# Patient Record
Sex: Female | Born: 1935 | Race: White | Hispanic: No | Smoking: Former smoker
Health system: Southern US, Community
[De-identification: ages and names within clinical notes are randomized; demographics above are authoritative.]

## PROBLEM LIST (undated history)

## (undated) DIAGNOSIS — IMO0001 Reserved for inherently not codable concepts without codable children: Secondary | ICD-10-CM

## (undated) DIAGNOSIS — Z9889 Other specified postprocedural states: Secondary | ICD-10-CM

## (undated) DIAGNOSIS — C50911 Malignant neoplasm of unspecified site of right female breast: Secondary | ICD-10-CM

## (undated) DIAGNOSIS — E119 Type 2 diabetes mellitus without complications: Secondary | ICD-10-CM

## (undated) DIAGNOSIS — M8580 Other specified disorders of bone density and structure, unspecified site: Secondary | ICD-10-CM

## (undated) DIAGNOSIS — J449 Chronic obstructive pulmonary disease, unspecified: Secondary | ICD-10-CM

## (undated) DIAGNOSIS — E7800 Pure hypercholesterolemia, unspecified: Secondary | ICD-10-CM

## (undated) DIAGNOSIS — M1990 Unspecified osteoarthritis, unspecified site: Secondary | ICD-10-CM

## (undated) DIAGNOSIS — W57XXXA Bitten or stung by nonvenomous insect and other nonvenomous arthropods, initial encounter: Secondary | ICD-10-CM

## (undated) DIAGNOSIS — M810 Age-related osteoporosis without current pathological fracture: Secondary | ICD-10-CM

## (undated) DIAGNOSIS — K635 Polyp of colon: Secondary | ICD-10-CM

## (undated) DIAGNOSIS — I10 Essential (primary) hypertension: Secondary | ICD-10-CM

## (undated) DIAGNOSIS — R112 Nausea with vomiting, unspecified: Secondary | ICD-10-CM

## (undated) DIAGNOSIS — C801 Malignant (primary) neoplasm, unspecified: Secondary | ICD-10-CM

## (undated) DIAGNOSIS — Z923 Personal history of irradiation: Secondary | ICD-10-CM

## (undated) DIAGNOSIS — C50919 Malignant neoplasm of unspecified site of unspecified female breast: Secondary | ICD-10-CM

## (undated) DIAGNOSIS — R918 Other nonspecific abnormal finding of lung field: Secondary | ICD-10-CM

## (undated) HISTORY — DX: Other nonspecific abnormal finding of lung field: R91.8

## (undated) HISTORY — DX: Other specified disorders of bone density and structure, unspecified site: M85.80

## (undated) HISTORY — PX: ABDOMINAL HYSTERECTOMY: SHX81

## (undated) HISTORY — PX: OTHER SURGICAL HISTORY: SHX169

## (undated) HISTORY — DX: Malignant neoplasm of unspecified site of right female breast: C50.911

## (undated) HISTORY — PX: BREAST EXCISIONAL BIOPSY: SUR124

## (undated) HISTORY — DX: Age-related osteoporosis without current pathological fracture: M81.0

## (undated) HISTORY — PX: BARTHOLIN GLAND CYST EXCISION: SHX565

## (undated) HISTORY — PX: BREAST SURGERY: SHX581

## (undated) HISTORY — PX: APPENDECTOMY: SHX54

## (undated) HISTORY — DX: Unspecified osteoarthritis, unspecified site: M19.90

## (undated) HISTORY — PX: TONSILLECTOMY: SUR1361

---

## 2000-12-07 ENCOUNTER — Ambulatory Visit (HOSPITAL_COMMUNITY): Admission: RE | Admit: 2000-12-07 | Discharge: 2000-12-07 | Payer: Self-pay | Admitting: Internal Medicine

## 2000-12-07 ENCOUNTER — Encounter: Payer: Self-pay | Admitting: Internal Medicine

## 2000-12-15 ENCOUNTER — Other Ambulatory Visit: Admission: RE | Admit: 2000-12-15 | Discharge: 2000-12-15 | Payer: Self-pay | Admitting: Internal Medicine

## 2001-12-12 ENCOUNTER — Ambulatory Visit (HOSPITAL_COMMUNITY): Admission: RE | Admit: 2001-12-12 | Discharge: 2001-12-12 | Payer: Self-pay | Admitting: Internal Medicine

## 2001-12-12 ENCOUNTER — Encounter: Payer: Self-pay | Admitting: Internal Medicine

## 2002-12-17 ENCOUNTER — Ambulatory Visit (HOSPITAL_COMMUNITY): Admission: RE | Admit: 2002-12-17 | Discharge: 2002-12-17 | Payer: Self-pay | Admitting: Internal Medicine

## 2002-12-17 ENCOUNTER — Encounter: Payer: Self-pay | Admitting: Internal Medicine

## 2003-03-04 ENCOUNTER — Ambulatory Visit (HOSPITAL_COMMUNITY): Admission: RE | Admit: 2003-03-04 | Discharge: 2003-03-04 | Payer: Self-pay | Admitting: Podiatry

## 2003-12-18 ENCOUNTER — Ambulatory Visit (HOSPITAL_COMMUNITY): Admission: RE | Admit: 2003-12-18 | Discharge: 2003-12-18 | Payer: Self-pay | Admitting: Internal Medicine

## 2004-12-21 ENCOUNTER — Ambulatory Visit (HOSPITAL_COMMUNITY): Admission: RE | Admit: 2004-12-21 | Discharge: 2004-12-21 | Payer: Self-pay | Admitting: Internal Medicine

## 2005-04-05 ENCOUNTER — Ambulatory Visit (HOSPITAL_COMMUNITY): Admission: RE | Admit: 2005-04-05 | Discharge: 2005-04-05 | Payer: Self-pay | Admitting: Family Medicine

## 2005-12-02 ENCOUNTER — Ambulatory Visit: Payer: Self-pay | Admitting: Orthopedic Surgery

## 2005-12-16 ENCOUNTER — Ambulatory Visit: Payer: Self-pay | Admitting: Orthopedic Surgery

## 2005-12-23 ENCOUNTER — Ambulatory Visit (HOSPITAL_COMMUNITY): Admission: RE | Admit: 2005-12-23 | Discharge: 2005-12-23 | Payer: Self-pay | Admitting: Orthopedic Surgery

## 2005-12-30 ENCOUNTER — Ambulatory Visit: Payer: Self-pay | Admitting: Orthopedic Surgery

## 2006-01-05 ENCOUNTER — Ambulatory Visit (HOSPITAL_COMMUNITY): Admission: RE | Admit: 2006-01-05 | Discharge: 2006-01-05 | Payer: Self-pay | Admitting: Family Medicine

## 2006-01-14 ENCOUNTER — Ambulatory Visit: Payer: Self-pay | Admitting: Internal Medicine

## 2006-01-24 ENCOUNTER — Encounter: Admission: RE | Admit: 2006-01-24 | Discharge: 2006-01-24 | Payer: Self-pay | Admitting: Orthopedic Surgery

## 2006-01-28 ENCOUNTER — Ambulatory Visit (HOSPITAL_COMMUNITY): Admission: RE | Admit: 2006-01-28 | Discharge: 2006-01-28 | Payer: Self-pay | Admitting: Internal Medicine

## 2006-01-28 ENCOUNTER — Ambulatory Visit: Payer: Self-pay | Admitting: Internal Medicine

## 2006-02-08 ENCOUNTER — Encounter: Admission: RE | Admit: 2006-02-08 | Discharge: 2006-02-08 | Payer: Self-pay | Admitting: Orthopedic Surgery

## 2006-02-15 ENCOUNTER — Ambulatory Visit (HOSPITAL_COMMUNITY): Admission: RE | Admit: 2006-02-15 | Discharge: 2006-02-15 | Payer: Self-pay | Admitting: Internal Medicine

## 2006-02-15 ENCOUNTER — Encounter (INDEPENDENT_AMBULATORY_CARE_PROVIDER_SITE_OTHER): Payer: Self-pay | Admitting: *Deleted

## 2006-02-15 ENCOUNTER — Ambulatory Visit: Payer: Self-pay | Admitting: Internal Medicine

## 2006-03-02 ENCOUNTER — Encounter: Admission: RE | Admit: 2006-03-02 | Discharge: 2006-03-02 | Payer: Self-pay | Admitting: Orthopedic Surgery

## 2006-03-28 ENCOUNTER — Ambulatory Visit (HOSPITAL_COMMUNITY): Admission: RE | Admit: 2006-03-28 | Discharge: 2006-03-28 | Payer: Self-pay | Admitting: Family Medicine

## 2006-04-06 ENCOUNTER — Ambulatory Visit: Payer: Self-pay | Admitting: Orthopedic Surgery

## 2006-06-15 ENCOUNTER — Ambulatory Visit: Payer: Self-pay | Admitting: Orthopedic Surgery

## 2006-11-23 ENCOUNTER — Ambulatory Visit: Payer: Self-pay | Admitting: Orthopedic Surgery

## 2007-01-09 ENCOUNTER — Ambulatory Visit (HOSPITAL_COMMUNITY): Admission: RE | Admit: 2007-01-09 | Discharge: 2007-01-09 | Payer: Self-pay | Admitting: Family Medicine

## 2007-04-03 ENCOUNTER — Ambulatory Visit (HOSPITAL_COMMUNITY): Admission: RE | Admit: 2007-04-03 | Discharge: 2007-04-03 | Payer: Self-pay | Admitting: Family Medicine

## 2007-06-08 ENCOUNTER — Ambulatory Visit: Payer: Self-pay | Admitting: Orthopedic Surgery

## 2007-06-08 DIAGNOSIS — M25569 Pain in unspecified knee: Secondary | ICD-10-CM

## 2007-06-08 DIAGNOSIS — M1711 Unilateral primary osteoarthritis, right knee: Secondary | ICD-10-CM | POA: Insufficient documentation

## 2008-01-17 ENCOUNTER — Ambulatory Visit (HOSPITAL_COMMUNITY): Admission: RE | Admit: 2008-01-17 | Discharge: 2008-01-17 | Payer: Self-pay | Admitting: Family Medicine

## 2008-04-24 ENCOUNTER — Ambulatory Visit (HOSPITAL_COMMUNITY): Admission: RE | Admit: 2008-04-24 | Discharge: 2008-04-24 | Payer: Self-pay | Admitting: Family Medicine

## 2008-05-01 ENCOUNTER — Ambulatory Visit (HOSPITAL_COMMUNITY): Admission: RE | Admit: 2008-05-01 | Discharge: 2008-05-01 | Payer: Self-pay | Admitting: Family Medicine

## 2008-05-27 ENCOUNTER — Ambulatory Visit: Payer: Self-pay | Admitting: Vascular Surgery

## 2009-01-21 ENCOUNTER — Ambulatory Visit (HOSPITAL_COMMUNITY): Admission: RE | Admit: 2009-01-21 | Discharge: 2009-01-21 | Payer: Self-pay | Admitting: Family Medicine

## 2009-05-12 ENCOUNTER — Ambulatory Visit (HOSPITAL_COMMUNITY): Admission: RE | Admit: 2009-05-12 | Discharge: 2009-05-12 | Payer: Self-pay | Admitting: Family Medicine

## 2010-02-06 ENCOUNTER — Ambulatory Visit (HOSPITAL_COMMUNITY): Admission: RE | Admit: 2010-02-06 | Discharge: 2010-02-06 | Payer: Self-pay | Admitting: Family Medicine

## 2010-05-08 ENCOUNTER — Other Ambulatory Visit (HOSPITAL_COMMUNITY): Payer: Self-pay | Admitting: Family Medicine

## 2010-05-08 DIAGNOSIS — I6529 Occlusion and stenosis of unspecified carotid artery: Secondary | ICD-10-CM

## 2010-05-08 DIAGNOSIS — I739 Peripheral vascular disease, unspecified: Secondary | ICD-10-CM

## 2010-05-14 ENCOUNTER — Ambulatory Visit (HOSPITAL_COMMUNITY)
Admission: RE | Admit: 2010-05-14 | Discharge: 2010-05-14 | Disposition: A | Discharge status: Home / Self Care | Payer: Medicare Other | Source: Ambulatory Visit | Attending: Family Medicine | Admitting: Family Medicine

## 2010-05-14 DIAGNOSIS — I2510 Atherosclerotic heart disease of native coronary artery without angina pectoris: Secondary | ICD-10-CM | POA: Insufficient documentation

## 2010-05-14 DIAGNOSIS — I739 Peripheral vascular disease, unspecified: Secondary | ICD-10-CM

## 2010-05-14 DIAGNOSIS — I6529 Occlusion and stenosis of unspecified carotid artery: Secondary | ICD-10-CM

## 2010-05-14 DIAGNOSIS — I10 Essential (primary) hypertension: Secondary | ICD-10-CM | POA: Insufficient documentation

## 2010-07-28 NOTE — Consult Note (Signed)
NEW PATIENT CONSULTATION   [name]  DOB:  1929-01-08                                       05/27/2008  [id number]   The patient is [name] 77 year old female referred by Dr. Wolfgang Phoenix for lower  extremity arterial insufficiency and left leg claudication.  She states  that she has had some mild discomfort in her left and right calf areas  with walking long distances.  She states she is able to walk  significantly longer than 1 block but not 1 mile before having to stop  and rest slightly.  She has had no history of rest pain, nonhealing  ulcers, infection or cellulitis.  She did have some right foot surgery  for [name] bunion and hammertoe last year and her leg feels much better since  then.  She also has [name] chronic history of neuropathy with burning  discomfort in her feet.   PAST MEDICAL HISTORY:  1. Hypertension.  2. Hyperlipidemia.  3. Peripheral neuropathy.  4. Negative for diabetes, coronary artery disease, COPD or stroke.   PAST SURGICAL HISTORY:  1. Right foot surgery for bunion and hammertoe.  2. Tonsillectomy.  3. Hysterectomy.  4. Bartholin cyst surgery.  5. Hernia repair.  6. Other foot type procedures.   FAMILY HISTORY:  Negative for coronary artery disease, diabetes or  stroke.   SOCIAL HISTORY:  She is widowed and retired, has not smoked cigarettes  in 20 years and does not use alcohol.   REVIEW OF SYSTEMS:  Negative for weight loss, anorexia, chest pain,  dyspnea on exertion, PND, orthopnea, hemoptysis, chronic bronchitis.  Negative for GI or GU symptoms, unremarkable.   ALLERGIES:  Neosporin, codeine, morphine, Loveat Plus, Darvocet-N; all  cause severe nausea.   MEDICATIONS:  Please see health history form.   PHYSICAL EXAMINATION:  Blood pressure 153/79, heart rate is 86,  respirations 14.  General:  She is [name] healthy-appearing female in no  apparent distress, alert and oriented x3.  Neck:  Supple, 3+ carotid  pulses palpable.   No bruits are audible.  Neurologic:  Normal.  No  palpable adenopathy in the neck.  Chest:  Clear to auscultation.  Cardiovascular:  Regular rhythm, no murmurs.  Abdomen:  Soft, nontender  with no masses.  She has 2+ to 3+ femoral, 1+ popliteal and 2+ dorsalis  pedis pulses palpable bilaterally.  Both feet are pink and well-perfused  with no infection, ulceration or gangrene.  Lower extremity arterial  Dopplers done on February 17th at [facility] revealed an ABI  of 0.61 on the left and 0.67 on the right with possible aortoiliac  disease.   I think she has some mild atherosclerotic disease involving both lower  extremities but the symptoms are not severe enough to warrant any  treatment at this time.  She ambulates well and has no limb threatening  problems and is quite comfortable with her ambulation.  She also has  palpable pulses in both feet.  If her symptoms should worsen or she  should develop any limb-threatening ischemia, infection, ulceration,  gangrene, etc. please let us know, will be happy to evaluate her again  at that time.   Nelda Severe Kellie Simmering, M.D.  Electronically Signed   JDL/MEDQ  D:  05/27/2008  T:  05/28/2008  Job:  [id number]   cc:   Nicki Reaper [name]. [place],  MD

## 2010-07-31 NOTE — H&P (Signed)
NAME:  Janaeh, Steinhart NO.:  [id number]   MEDICAL RECORD NO.:  [id number]                  PATIENT TYPE:   LOCATION:                                       FACILITY:   PHYSICIAN:  Zella Richer. Berline Lopes, D.P.M.             DATE OF BIRTH:   DATE OF ADMISSION:  03/04/2003  DATE OF DISCHARGE:                                HISTORY & PHYSICAL   HISTORY OF PRESENT ILLNESS:  Ms. Georganna Skeans is a 77 year old white female  who underwent correction of a bunion deformity on her left foot and is now  in to have pins removed from her first metatarsal.  The patient has  symptomatic pins for approximately two months.   PAST MEDICAL HISTORY:  Uneventful.   MEDICATIONS:  1. Prinizide.  2. Amitriptyline.  3. Centrum Silver.   ALLERGIES:  1. CODEINE.  2. SULFA DRUGS.  3. NEOSPORIN.   No transfusions or hepatitis.   REVIEW OF SYSTEMS:  History of arthritis, hypertension.   PHYSICAL EXAMINATION:  EXTREMITIES:  Lower extremity examination reveals  palpable pedal pulses of both DP and PT with spontaneous capillary filling  time.  NEUROLOGIC:  Essentially within normal limits.  MUSCULOSKELETAL:  Pain to palpation over the first metatarsal of her left  foot where the pins were introduced.   ASSESSMENT:  Symptomatic internal fixation device, left foot.   PLAN:  The patient will undergo surgical removal.  I reviewed the procedure  with the patient, including complications such as infection, bone infection,  postoperative pain, swelling, etc.  The patient seems to understand the  same, and again the surgery has been scheduled for March 04, 2003.                                               Zella Richer Berline Lopes, D.P.M.    DBT/MEDQ  D:  03/03/2003  T:  03/03/2003  Job:  [id number]

## 2010-07-31 NOTE — H&P (Signed)
[name], [name]            ACCOUNT NO.:  [id number]   MEDICAL RECORD NO.:  [id number]          PATIENT TYPE:  AMB   LOCATION:                                FACILITY:  [facility]   PHYSICIAN:  Hildred Laser, M.D.    DATE OF BIRTH:  Dec 21, 1928   DATE OF ADMISSION:  01/14/2006  DATE OF DISCHARGE:  LH                                HISTORY & PHYSICAL   PRESENTING COMPLAINT:  History of colonic polyps, time for colonoscopy which  she would like to have here in [place].   HISTORY OF PRESENT ILLNESS:  [name] is a 77 year old Caucasian female  patient of Dr. Sallee Lange who is self-referred for GI evaluation.  She had  high-risk screening colonoscopy back in 1999 by Dr. Sanjuana Letters at [facility] and  had a large polyp or two removed.  She had a follow-up exam in 2002 and was  normal.  She was therefore advised to have her next exam in 5 years.  She  states it is so hard to travel back and forth, particularly after having  taken the prep, and she would like to have to the exam at [facility].  She  is also not aware that Dr. Drema Halon has left [facility] and is now at [facility].  She  feels well other than her neuropathic leg pain.  She has a good appetite.  She has gained a few pounds this year because of decrease in her physical  activity.  She denies melena, rectal bleeding or change in her bowel habits.  She has problems with flatulence but this is not new.   MEDICATIONS:  1. Aspirin 81 mg daily.  2. Lasix 20 mg daily.  3. Lisinopril 20 mg daily.  4. Bilberry 160 mg daily.  5. Vitamin E 400 units daily.  6. Vitamin D 800 units daily.  7. MVI with mineral daily.  8. Os-Cal with D 500 daily.  9. Osteo Bi-Flex daily.  10.Estra-C 500 mg daily.  11.Nortriptyline 10 mg q.h.s.  12.Fosamax 70 mg weekly.   PAST MEDICAL HISTORY:  She has been hypertensive for about 10 years.  She  has peripheral neuropathy, cause unknown, and is being followed at [facility].  Osteoporosis was diagnosed 5 years ago,  recent bone density study showed  much improvement with therapy.  History of colonic polyps as above.  She had  tonsillectomy and appendectomy as a child.  She had hysterectomy with BSO in  1960.  She had excision of Bartholin gland for abscess in 1965.  She had a  benign lump removed from her breast and more recently she had left  bunionectomy 3 years ago.   ALLERGIES:  TO CODEINE, MORPHINE AND LORCET PLUS ALL CAUSING INTRACTABLE  NAUSEA AND SHE HAD SKIN RASH AND ITCHING WITH NEOSPORIN.   FAMILY HISTORY:  Father was diagnosed with colon carcinoma at 50 and died at  69.  She lost one first cousin of colon carcinoma in his early 50s and he  had surgery in his late 20s.  Her paternal grandmother had malignancy,  possibly colonic, but not confirmed.  Mother died  of COPD at age 55.  She  has a sister in good health.   SOCIAL HISTORY:  She is a widow.  She does not have any children.  She is  retired from [employer] where she worked for years.  She  smoked two packs a day for 30 years but quit 18 years ago and she does not  drink alcohol.   PHYSICAL EXAMINATION:  GENERAL:  Pleasant well-developed, well-nourished  Caucasian female who is in no acute distress.  VITAL SIGNS:  She weighs 150 pounds.  She is 5 feet 2-1/2 inches tall.  Pulse 80 per minute.  Blood pressure 138/62.  Temperature is 97.9.  HEENT:  Conjunctivae is pink.  Sclerae is nonicteric.  Oropharyngeal mucosa  is normal.  She has upper and lower dentures in place.  No neck masses or  thyromegaly noted.  CARDIOVASCULAR:  Cardiac exam with a regular rhythm.  Normal S1-S2.  No  murmur or gallop noted.  RESPIRATORY:  Lungs are clear to auscultation.  ABDOMEN:  Abdomen is soft and nontender without organomegaly or masses.  (Not mentioned above she states she has had part of her intestines, probably  small bowel, removed at the time of hysterectomy which was related to  endometriosis diagnosed at the time of  surgery).  No organomegaly or masses  noted.  RECTAL:  Examination deferred.  EXTREMITIES:  She does not have peripheral edema or clubbing.   ASSESSMENT:  [name] is a 77 year old Caucasian female with history of  colonic polyps and family history of colon carcinoma whose last colonoscopy  was a little over 5 years ago and was normal.  She does not have any  gastrointestinal symptoms.  It is time for her to have her surveillance  colonoscopy.   PLAN:  Colonoscopy to be performed at [facility] in near future.  She will hold her  aspirin for 2 days in preparation for the procedure.  She has copy of the  prep that she took for her two prior colonoscopies and she would like to  take the same prep which is quite elaborate and step-by-step and I have no  problem if she takes this prep.      Hildred Laser, M.D.  Electronically Signed     NR/MEDQ  D:  01/14/2006  T:  01/15/2006  Job:  [id number]   cc:   Nicki Reaper A. Wolfgang Phoenix, MD  Fax: [phone]   [facility]  Fax: [phone]

## 2010-07-31 NOTE — Op Note (Signed)
[name], [name]                        ACCOUNT NO.:  [id number]   MEDICAL RECORD NO.:  [id number]                  PATIENT TYPE:   LOCATION:                                       FACILITY:   PHYSICIAN:  Zella Richer. Berline Lopes, D.P.M.             DATE OF BIRTH:   DATE OF PROCEDURE:  03/04/2003  DATE OF DISCHARGE:                                 OPERATIVE REPORT   SURGEON:  Zella Richer. Berline Lopes, D.P.M.   ANESTHESIA:  Local.   PREOPERATIVE DIAGNOSIS:  Symptomatic internal fixation device, left foot.   POSTOPERATIVE DIAGNOSIS:  Symptomatic internal fixation device, left foot.   PROCEDURE:  Removal of internal fixation device x2, left foot.   DESCRIPTION OF PROCEDURE:  The patient was brought to the operating room and  placed on the operating table in the supine position.  The patient's lower  left foot and leg was then prepped and draped in the usual aseptic manner.  Then with local infiltrated 2% Xylocaine and 0.05% Marcaine, the following  surgical procedure was then performed under monitored anesthesia care.   Removal of symptomatic internal fixation device x2, left foot.  Attention  was directed to the dorsal aspect of the first metatarsal where a linear  incision was made.  The incision was widened and deepened via sharp and  blunt dissection, being sure to identify and tract all vital structures.  A  capsular incision was made.  Pins were identified x2, removed from the wound  entirely.  The wound was lavaged with copious amounts of sterile saline.  The capsules and subcutaneous tissue were approximated utilizing interrupted  sutures of 4-0 Dexon.  The skin was approximated utilizing horizontal  mattress sutures of 4-0 Prolene.   All surgical sites were then infiltrated with approximately one-eighth cc of  dexamethasone phosphate.  Mild compressive bandages consisting of Betadine-  soaked Adaptic, sterile 4 x 4s, sterile Kling were then applied.  The  patient tolerated the  procedure well and left the operating room in apparent  good condition with vital signs stable to the recovery room.      ___________________________________________                                            Zella Richer Berline Lopes, D.P.M.   DBT/MEDQ  D:  03/04/2003  T:  03/04/2003  Job:  [id number]

## 2010-07-31 NOTE — Op Note (Signed)
[name], [name]            ACCOUNT NO.:  [id number]   MEDICAL RECORD NO.:  [id number]          PATIENT TYPE:  AMB   LOCATION:  DAY                           FACILITY:  [facility]   PHYSICIAN:  Hildred Laser, M.D.    DATE OF BIRTH:  1928/03/24   DATE OF PROCEDURE:  01/28/2006  DATE OF DISCHARGE:                                 OPERATIVE REPORT   PROCEDURE:  Colonoscopy.   INDICATIONS:  [name] is a 77 year old Caucasian female who has a history of  colonic polyps.  Her last 2 colonoscopies were at [facility]; the last one was in  2002 and was normal.  She recalls she either had one large or two polyps  removed back in 1999.  She wanted to have this colonoscopy locally as this  was so convenient for her.  Procedure and risks were reviewed with the  patient, and informed consent was obtained.   MEDICATIONS FOR CONSCIOUS SEDATION:  Demerol 50 mg IV, Versed 5 mg IV.   FINDINGS:  Procedure performed in endoscopy suite.  The patient's vital  signs and O2 saturation were monitored during the procedure and remained  stable.  The patient was placed in left lateral position and rectal  examination performed.  No abnormality noted on external or digital exam.  Olympus videoscope was placed in the rectum and advanced under vision into  sigmoid colon.  She had formed stool at proximal sigmoid colon but slowly  and carefully scope was passed into descending colon where she had more  formed stool but not completely coating the mucosa.  However, as the scope  was passed into proximal transverse colon and descending colon, mucosa was  diffusely coated by formed stool.  Therefore, mucosal landmarks could not be  well seen.  There was no abnormality seen on the way out.  The rectal mucosa  was normal.  Scope was retroflexed to examine anorectal junction which was  unremarkable.  Endoscope was straightened and withdrawn.  The patient  tolerated the procedure well.   FINAL DIAGNOSIS:  Examination performed  to ascending colon or cecum but  limited because of suboptimal prep.   RECOMMENDATIONS:  I am afraid she will need to be reprepped and will return  another day after the holidays.      Hildred Laser, M.D.  Electronically Signed     NR/MEDQ  D:  01/28/2006  T:  01/28/2006  Job:  [id number]   cc:   Nicki Reaper A. Wolfgang Phoenix, MD  Fax: [phone]

## 2010-07-31 NOTE — Op Note (Signed)
[name], [name]            ACCOUNT NO.:  [id number]   MEDICAL RECORD NO.:  [id number]          PATIENT TYPE:  AMB   LOCATION:  DAY                           FACILITY:  [facility]   PHYSICIAN:  Hildred Laser, M.D.    DATE OF BIRTH:  14-Jul-1928   DATE OF PROCEDURE:  02/15/2006  DATE OF DISCHARGE:                               OPERATIVE REPORT   PROCEDURE:  Colonoscopy with polypectomy.   INDICATIONS:  [name] is a 77 year old Caucasian female with history of  colonic polyps whose last two exams were at [facility].  She was here for a  colonoscopy, on 01/28/2006, and her prep was very poor.  She is  therefore returning after vigorous prep.  Procedure risks were reviewed  with the patient and informed consent was obtained.   MEDICATIONS FOR CONSCIOUS SEDATION:  Demerol 25 mg IV, Versed 4 mg IV.   FINDINGS:  Procedure performed in endoscopy suite.  The patient's vital  signs and O2 sat were monitored during the procedure and remained  stable.  The patient was placed in left lateral recumbent position and  rectal examination performed.  No abnormality noted on external or  digital exam.  Olympus videoscope was placed in the rectum and advanced  under vision into sigmoid colon and beyond.  The preparation was  excellent except she had stool coating part of the cecum, which was  washed away to see the underlying landmarks.  Pictures were taken for  the record.  As the scope was withdrawn, colonic mucosa was carefully  examined.  There was a small polyp at hepatic flexure which was ablated  via cold biopsy.  There were two more polyps at proximal transverse  colon.  One was about 6 mm.  This was snared.  There was another smaller  polyp located distal to it, which was ablated via cold biopsy.  Mucosa  of rest of the colon was normal.  There was a single diverticulum at  sigmoid colon.  Rectal mucosa was normal.  Scope was retroflexed to  examine anorectal junction and a single anal papilla was  noted.  Endoscope was straightened and withdrawn.  The patient tolerated the  procedure well.   FINAL DIAGNOSIS:  Examination performed to cecum.   Two small polyps ablated via cold biopsy, one from hepatic flexure and  other one from transverse colon, and a third polyp was snared from  proximal transverse colon.  The largest polyps were about 6 mm, others  were smaller.   Single diverticulum at sigmoid colon.   RECOMMENDATIONS:  Standard instructions given.  High-fiber diet plus fiber supplement, Colace 2 tablets at bedtime, to  help her with a constipation.   I will be contacting patient results of biopsy and further  recommendations.      Hildred Laser, M.D.  Electronically Signed     NR/MEDQ  D:  02/15/2006  T:  02/15/2006  Job:  [id number]   cc:   Nicki Reaper A. Wolfgang Phoenix, MD  Fax: [phone]

## 2011-01-25 ENCOUNTER — Other Ambulatory Visit: Payer: Self-pay | Admitting: Family Medicine

## 2011-01-25 ENCOUNTER — Telehealth (INDEPENDENT_AMBULATORY_CARE_PROVIDER_SITE_OTHER): Payer: Self-pay | Admitting: Internal Medicine

## 2011-01-25 DIAGNOSIS — Z139 Encounter for screening, unspecified: Secondary | ICD-10-CM

## 2011-01-25 NOTE — Telephone Encounter (Signed)
[name] called to schedule her 5 yr tcs. Please return the call to [phone]. She will be out in the yard today, 01/25/11.

## 2011-01-27 NOTE — Telephone Encounter (Signed)
lmom asking patient to call so I can triage & sch TCS

## 2011-02-01 ENCOUNTER — Telehealth (INDEPENDENT_AMBULATORY_CARE_PROVIDER_SITE_OTHER): Payer: Self-pay | Admitting: *Deleted

## 2011-02-01 ENCOUNTER — Other Ambulatory Visit (INDEPENDENT_AMBULATORY_CARE_PROVIDER_SITE_OTHER): Payer: Self-pay | Admitting: *Deleted

## 2011-02-01 ENCOUNTER — Encounter (INDEPENDENT_AMBULATORY_CARE_PROVIDER_SITE_OTHER): Payer: Self-pay | Admitting: *Deleted

## 2011-02-01 DIAGNOSIS — Z86010 Personal history of colonic polyps: Secondary | ICD-10-CM

## 2011-02-01 MED ORDER — OSMOPREP 1.102-0.398 G PO TABS: 1.0000 | ORAL_TABLET | Freq: Once | ORAL | Status: DC

## 2011-02-01 NOTE — Telephone Encounter (Signed)
TCS sch'd 03/25/11, patient aware

## 2011-02-01 NOTE — Telephone Encounter (Signed)
Patient needs osmo pill prep 

## 2011-02-12 ENCOUNTER — Ambulatory Visit (HOSPITAL_COMMUNITY)
Admission: RE | Admit: 2011-02-12 | Discharge: 2011-02-12 | Disposition: A | Discharge status: Home / Self Care | Payer: Medicare Other | Source: Ambulatory Visit | Attending: Family Medicine | Admitting: Family Medicine

## 2011-02-12 DIAGNOSIS — Z1231 Encounter for screening mammogram for malignant neoplasm of breast: Secondary | ICD-10-CM | POA: Insufficient documentation

## 2011-02-12 DIAGNOSIS — Z139 Encounter for screening, unspecified: Secondary | ICD-10-CM

## 2011-03-03 ENCOUNTER — Telehealth (INDEPENDENT_AMBULATORY_CARE_PROVIDER_SITE_OTHER): Payer: Self-pay | Admitting: *Deleted

## 2011-03-03 NOTE — Telephone Encounter (Signed)
PCP/Requesting MD:  Sallee Lange  Name: [name]  DOB: [birth year]   Procedure: TCS  Reason/Indication:  HX POLYPS  Has patient had this procedure before?  YES  If so, when, by whom and where?  2007  Is there a family history of colon cancer?  YES  Who?  What age when diagnosed?  FATHER  Is patient diabetic?   NO      Does patient have prosthetic heart valve?  NO  Do you have a pacemaker?  NO  Has patient had joint replacement within last 12 months?  NO  Is patient on Coumadin, Plavix and/or Aspirin? YES  Other:   Medications: ASA 81 MG DAILY, LISINOPRIL 20 MG DAILY, FUROSEMIDE 20 MG DAILY, OS CAL BID, OSTEO BIFLEX DAILY, BILBERRY 80 MG BID, CENTRUM SILVER DAILY, MIRALAX DAILY, ALENDRONATE 70 MG WEEKLY, NORTRITYLINE 10 MG 2-4 CAPS NIGHTLY, STOOL SOFTENER PRN  Allergies: NKDA  Pharmacy:  [facility]  Medication Adjustment: ASA 2 DAYS  Procedure date & time: 03/25/11 @ 930

## 2011-03-05 NOTE — Telephone Encounter (Signed)
approved

## 2011-03-23 DIAGNOSIS — H35379 Puckering of macula, unspecified eye: Secondary | ICD-10-CM | POA: Diagnosis not present

## 2011-03-23 DIAGNOSIS — H35369 Drusen (degenerative) of macula, unspecified eye: Secondary | ICD-10-CM | POA: Diagnosis not present

## 2011-03-24 ENCOUNTER — Encounter (HOSPITAL_COMMUNITY): Payer: Self-pay | Admitting: Pharmacy Technician

## 2011-03-24 MED ORDER — SODIUM CHLORIDE 0.45 % IV SOLN
Freq: Once | INTRAVENOUS | Status: AC
  Administered 2011-03-25: 09:00:00 via INTRAVENOUS

## 2011-03-25 ENCOUNTER — Encounter (HOSPITAL_COMMUNITY)
Admission: RE | Disposition: A | Discharge status: Home / Self Care | Payer: Self-pay | Source: Ambulatory Visit | Attending: Internal Medicine

## 2011-03-25 ENCOUNTER — Ambulatory Visit (HOSPITAL_COMMUNITY)
Admission: RE | Admit: 2011-03-25 | Discharge: 2011-03-25 | Disposition: A | Discharge status: Home / Self Care | Payer: Medicare Other | Source: Ambulatory Visit | Attending: Internal Medicine | Admitting: Internal Medicine

## 2011-03-25 ENCOUNTER — Other Ambulatory Visit (INDEPENDENT_AMBULATORY_CARE_PROVIDER_SITE_OTHER): Payer: Self-pay | Admitting: Internal Medicine

## 2011-03-25 ENCOUNTER — Encounter (HOSPITAL_COMMUNITY): Payer: Self-pay | Admitting: *Deleted

## 2011-03-25 DIAGNOSIS — I10 Essential (primary) hypertension: Secondary | ICD-10-CM | POA: Diagnosis not present

## 2011-03-25 DIAGNOSIS — K5520 Angiodysplasia of colon without hemorrhage: Secondary | ICD-10-CM | POA: Diagnosis not present

## 2011-03-25 DIAGNOSIS — Q2733 Arteriovenous malformation of digestive system vessel: Secondary | ICD-10-CM

## 2011-03-25 DIAGNOSIS — Z800 Family history of malignant neoplasm of digestive organs: Secondary | ICD-10-CM | POA: Diagnosis not present

## 2011-03-25 DIAGNOSIS — D126 Benign neoplasm of colon, unspecified: Secondary | ICD-10-CM | POA: Insufficient documentation

## 2011-03-25 DIAGNOSIS — Z79899 Other long term (current) drug therapy: Secondary | ICD-10-CM | POA: Diagnosis not present

## 2011-03-25 DIAGNOSIS — Z860100 Personal history of colon polyps, unspecified: Secondary | ICD-10-CM

## 2011-03-25 DIAGNOSIS — E7800 Pure hypercholesterolemia, unspecified: Secondary | ICD-10-CM | POA: Insufficient documentation

## 2011-03-25 HISTORY — PX: COLONOSCOPY: SHX5424

## 2011-03-25 HISTORY — DX: Essential (primary) hypertension: I10

## 2011-03-25 HISTORY — DX: Other specified postprocedural states: R11.2

## 2011-03-25 HISTORY — DX: Polyp of colon: K63.5

## 2011-03-25 HISTORY — DX: Other specified postprocedural states: Z98.890

## 2011-03-25 HISTORY — DX: Pure hypercholesterolemia, unspecified: E78.00

## 2011-03-25 SURGERY — COLONOSCOPY
Anesthesia: Moderate Sedation

## 2011-03-25 MED ORDER — STERILE WATER FOR IRRIGATION IR SOLN
Status: DC | PRN
  Administered 2011-03-25: 10:00:00

## 2011-03-25 MED ORDER — MEPERIDINE HCL 50 MG/ML IJ SOLN
INTRAMUSCULAR | Status: DC | PRN
  Administered 2011-03-25: 25 mg via INTRAVENOUS

## 2011-03-25 MED ORDER — MIDAZOLAM HCL 5 MG/5ML IJ SOLN
INTRAMUSCULAR | Status: DC | PRN
  Administered 2011-03-25: 2 mg via INTRAVENOUS
  Administered 2011-03-25 (×2): 1 mg via INTRAVENOUS

## 2011-03-25 MED ORDER — MEPERIDINE HCL 50 MG/ML IJ SOLN
INTRAMUSCULAR | Status: AC
  Filled 2011-03-25: qty 1

## 2011-03-25 MED ORDER — MIDAZOLAM HCL 5 MG/5ML IJ SOLN
INTRAMUSCULAR | Status: AC
  Filled 2011-03-25: qty 10

## 2011-03-25 NOTE — Op Note (Signed)
COLONOSCOPY PROCEDURE REPORT  PATIENT:  [name]  MR#:  [id number] Birthdate:  06-22-1928, 78 y.o., female Endoscopist:  Dr. Rogene Houston, MD Referred By:  Dr. Sallee Lange, MD Procedure Date: 03/25/2011  Procedure:   Colonoscopy with polypectomy  Indications:  Patient is 78 year-old Caucasian female with history of colonic adenomas and family history of colon carcinoma. She is undergoing surveillance colonoscopy.  Informed Consent:  Procedure and risks were reviewed with the patient and informed consent was obtained.  Medications:  Demerol 25 mg IV Versed 4 mg IV  Description of procedure:  After a digital rectal exam was performed, that colonoscope was advanced from the anus through the rectum and colon to the area of the cecum, ileocecal valve and appendiceal orifice. The cecum was deeply intubated. These structures were well-seen and photographed for the record. From the level of the cecum and ileocecal valve, the scope was slowly and cautiously withdrawn. The mucosal surfaces were carefully surveyed utilizing scope tip to flexion to facilitate fold flattening as needed. The scope was pulled down into the rectum where a thorough exam including retroflexion was performed.  Findings:   Prep excellent except she had a thick stool coating of mucosa of ascending colon and cecum. Cecal landmarks identified after vigorous washing. Single small cecal AV malformation 6 mm polyp snared from proximal transverse colon. Two  small polyps ablated via cold biopsy from splenic flexure and submitted in one container. Normal mucosa at rectum and anorectal junction.  Therapeutic/Diagnostic Maneuvers Performed:  See above  Complications:  None  Cecal Withdrawal Time:  12 minutes  Impression:  Examination performed to cecum. Evaluation of ascending colon and cecum somewhat limited because of prep. Small cecal AV malformation. 6 mm polyp snared from proximal transverse colon. Two small  polyps ablated via cold biopsy from splenic flexure and submitted in one container.   Recommendations:  No aspirin for one week. I will be contacting patient with results of biopsy and further recommendations.  REHMAN,NAJEEB U  03/25/2011 10:19 AM  CC: Dr. Sallee Lange, MD, MD & Dr. Rayne Du ref. provider found

## 2011-03-25 NOTE — H&P (Signed)
[name] is an 78 y.o. female.   Chief Complaint: Patient is here for colonoscopy. HPI: Patient is a 45-year-old Caucasian female with history of colonic polyps and family of colon carcinoma. She's had colonic adenomas removed on multiple occasions she had 3 adenomas removed her last exam in 2007. She denies abdominal pain rectal bleeding or change in her bowel habits. Father had colon carcinoma diagnosed at age 33 and died of metastatic disease 2 years later. She had first cousin on father's side also died of colon carcinoma in his early 67s; he was diagnosed in his 42s  Past Medical History  Diagnosis Date  . Hypertension   . Hypercholesteremia   . Colon polyps   . PONV (postoperative nausea and vomiting)     Past Surgical History  Procedure Date  . Abdominal hysterectomy   . Appendectomy   . Tonsillectomy   . Colonoscopy   . Bartholin gland cyst excision   . Left inguinal hernia repair   . Multiple toe surgeries     Family History  Problem Relation Age of Onset  . Colon cancer Father    Social History:  reports that she has quit smoking. She does not have any smokeless tobacco history on file. She reports that she does not drink alcohol or use illicit drugs.  Allergies:  Allergies  Allergen Reactions  . Codeine Nausea And Vomiting  . Morphine Nausea And Vomiting  . Triple Antibiotic Itching and Rash    Medications Prior to Admission  Medication Dose Route Frequency Provider Last Rate Last Dose  . 0.45 % sodium chloride infusion   Intravenous Once Rogene Houston, MD 20 mL/hr at 03/25/11 0848    . meperidine (DEMEROL) 50 MG/ML injection           . midazolam (VERSED) 5 MG/5ML injection           . simethicone susp in sterile water 1000 mL irrigation    PRN Rogene Houston, MD       No current outpatient prescriptions on file as of 03/25/2011.    No results found for this or any previous visit (from the past 48 hour(s)). No results found.  Review of Systems    Constitutional: Weight loss:  26 pounds voluntary weight loss to control diabetes mellitus.  Gastrointestinal: Negative for abdominal pain, diarrhea, constipation, blood in stool and melena.    Blood pressure 137/67, pulse 95, temperature 98.2 F (36.8 C), temperature source Oral, resp. rate 21, height 5\' 2"  (1.575 m), weight 129 lb (58.514 kg), SpO2 96.00%. Physical Exam  Constitutional: She appears well-developed and well-nourished.  HENT:  Mouth/Throat: Oropharynx is clear and moist.  Eyes: Conjunctivae are normal. No scleral icterus.  Neck: No thyromegaly present.  Cardiovascular: Normal rate, regular rhythm and normal heart sounds.   No murmur heard. Respiratory: Effort normal and breath sounds normal.  GI: Soft. She exhibits no distension and no mass. There is no tenderness.  Musculoskeletal: She exhibits no edema.  Lymphadenopathy:    She has no cervical adenopathy.  Neurological: She is alert.  Skin: Skin is warm and dry.     Assessment/Plan History of colonic polyps. Family history of CRC. Surveillance colonoscopy  REHMAN,NAJEEB U 03/25/2011, 9:48 AM

## 2011-03-29 ENCOUNTER — Encounter (INDEPENDENT_AMBULATORY_CARE_PROVIDER_SITE_OTHER): Payer: Self-pay | Admitting: *Deleted

## 2011-03-29 ENCOUNTER — Encounter (HOSPITAL_COMMUNITY): Payer: Self-pay | Admitting: Internal Medicine

## 2011-05-03 ENCOUNTER — Other Ambulatory Visit: Payer: Self-pay | Admitting: Family Medicine

## 2011-05-03 DIAGNOSIS — Z09 Encounter for follow-up examination after completed treatment for conditions other than malignant neoplasm: Secondary | ICD-10-CM

## 2011-05-04 ENCOUNTER — Other Ambulatory Visit: Payer: Self-pay | Admitting: Family Medicine

## 2011-05-04 ENCOUNTER — Ambulatory Visit (HOSPITAL_COMMUNITY)
Admission: RE | Admit: 2011-05-04 | Discharge: 2011-05-04 | Disposition: A | Discharge status: Home / Self Care | Payer: Medicare Other | Source: Ambulatory Visit | Attending: Family Medicine | Admitting: Family Medicine

## 2011-05-04 DIAGNOSIS — I6529 Occlusion and stenosis of unspecified carotid artery: Secondary | ICD-10-CM | POA: Insufficient documentation

## 2011-05-04 DIAGNOSIS — I743 Embolism and thrombosis of arteries of the lower extremities: Secondary | ICD-10-CM | POA: Diagnosis not present

## 2011-05-04 DIAGNOSIS — M79609 Pain in unspecified limb: Secondary | ICD-10-CM | POA: Insufficient documentation

## 2011-05-04 DIAGNOSIS — I10 Essential (primary) hypertension: Secondary | ICD-10-CM | POA: Insufficient documentation

## 2011-05-04 DIAGNOSIS — Z09 Encounter for follow-up examination after completed treatment for conditions other than malignant neoplasm: Secondary | ICD-10-CM

## 2011-05-13 DIAGNOSIS — H1130 Conjunctival hemorrhage, unspecified eye: Secondary | ICD-10-CM | POA: Diagnosis not present

## 2011-07-12 DIAGNOSIS — M5430 Sciatica, unspecified side: Secondary | ICD-10-CM | POA: Diagnosis not present

## 2011-07-12 DIAGNOSIS — M1710 Unilateral primary osteoarthritis, unspecified knee: Secondary | ICD-10-CM | POA: Diagnosis not present

## 2011-07-12 DIAGNOSIS — R269 Unspecified abnormalities of gait and mobility: Secondary | ICD-10-CM | POA: Diagnosis not present

## 2011-08-19 DIAGNOSIS — Z79899 Other long term (current) drug therapy: Secondary | ICD-10-CM | POA: Diagnosis not present

## 2011-08-19 DIAGNOSIS — E782 Mixed hyperlipidemia: Secondary | ICD-10-CM | POA: Diagnosis not present

## 2011-08-19 DIAGNOSIS — R7301 Impaired fasting glucose: Secondary | ICD-10-CM | POA: Diagnosis not present

## 2011-08-24 ENCOUNTER — Other Ambulatory Visit: Payer: Self-pay | Admitting: Family Medicine

## 2011-08-24 DIAGNOSIS — I10 Essential (primary) hypertension: Secondary | ICD-10-CM | POA: Diagnosis not present

## 2011-08-24 DIAGNOSIS — M8580 Other specified disorders of bone density and structure, unspecified site: Secondary | ICD-10-CM

## 2011-08-24 DIAGNOSIS — E785 Hyperlipidemia, unspecified: Secondary | ICD-10-CM | POA: Diagnosis not present

## 2011-08-24 DIAGNOSIS — E119 Type 2 diabetes mellitus without complications: Secondary | ICD-10-CM | POA: Diagnosis not present

## 2011-08-24 DIAGNOSIS — M810 Age-related osteoporosis without current pathological fracture: Secondary | ICD-10-CM | POA: Diagnosis not present

## 2011-08-31 ENCOUNTER — Ambulatory Visit (HOSPITAL_COMMUNITY)
Admission: RE | Admit: 2011-08-31 | Discharge: 2011-08-31 | Disposition: A | Discharge status: Home / Self Care | Payer: Medicare Other | Source: Ambulatory Visit | Attending: Family Medicine | Admitting: Family Medicine

## 2011-08-31 DIAGNOSIS — M949 Disorder of cartilage, unspecified: Secondary | ICD-10-CM | POA: Insufficient documentation

## 2011-08-31 DIAGNOSIS — Z780 Asymptomatic menopausal state: Secondary | ICD-10-CM | POA: Diagnosis not present

## 2011-08-31 DIAGNOSIS — M899 Disorder of bone, unspecified: Secondary | ICD-10-CM | POA: Diagnosis not present

## 2011-08-31 DIAGNOSIS — M8580 Other specified disorders of bone density and structure, unspecified site: Secondary | ICD-10-CM

## 2011-09-21 DIAGNOSIS — H25019 Cortical age-related cataract, unspecified eye: Secondary | ICD-10-CM | POA: Diagnosis not present

## 2011-09-21 DIAGNOSIS — H2510 Age-related nuclear cataract, unspecified eye: Secondary | ICD-10-CM | POA: Diagnosis not present

## 2012-01-13 DIAGNOSIS — J310 Chronic rhinitis: Secondary | ICD-10-CM | POA: Diagnosis not present

## 2012-01-13 DIAGNOSIS — Z23 Encounter for immunization: Secondary | ICD-10-CM | POA: Diagnosis not present

## 2012-01-13 DIAGNOSIS — J329 Chronic sinusitis, unspecified: Secondary | ICD-10-CM | POA: Diagnosis not present

## 2012-01-13 DIAGNOSIS — J309 Allergic rhinitis, unspecified: Secondary | ICD-10-CM | POA: Diagnosis not present

## 2012-02-01 DIAGNOSIS — G609 Hereditary and idiopathic neuropathy, unspecified: Secondary | ICD-10-CM | POA: Diagnosis not present

## 2012-02-07 ENCOUNTER — Other Ambulatory Visit: Payer: Self-pay | Admitting: Family Medicine

## 2012-02-07 DIAGNOSIS — Z139 Encounter for screening, unspecified: Secondary | ICD-10-CM

## 2012-02-14 ENCOUNTER — Ambulatory Visit (HOSPITAL_COMMUNITY)
Admission: RE | Admit: 2012-02-14 | Discharge: 2012-02-14 | Disposition: A | Discharge status: Home / Self Care | Payer: Medicare Other | Source: Ambulatory Visit | Attending: Family Medicine | Admitting: Family Medicine

## 2012-02-14 DIAGNOSIS — Z139 Encounter for screening, unspecified: Secondary | ICD-10-CM

## 2012-02-14 DIAGNOSIS — R922 Inconclusive mammogram: Secondary | ICD-10-CM | POA: Diagnosis not present

## 2012-02-14 DIAGNOSIS — Z1231 Encounter for screening mammogram for malignant neoplasm of breast: Secondary | ICD-10-CM | POA: Diagnosis not present

## 2012-02-18 ENCOUNTER — Other Ambulatory Visit: Payer: Self-pay | Admitting: Family Medicine

## 2012-02-18 DIAGNOSIS — R928 Other abnormal and inconclusive findings on diagnostic imaging of breast: Secondary | ICD-10-CM

## 2012-02-24 DIAGNOSIS — E785 Hyperlipidemia, unspecified: Secondary | ICD-10-CM | POA: Diagnosis not present

## 2012-02-24 DIAGNOSIS — R7301 Impaired fasting glucose: Secondary | ICD-10-CM | POA: Diagnosis not present

## 2012-02-24 DIAGNOSIS — Z79899 Other long term (current) drug therapy: Secondary | ICD-10-CM | POA: Diagnosis not present

## 2012-02-24 DIAGNOSIS — M899 Disorder of bone, unspecified: Secondary | ICD-10-CM | POA: Diagnosis not present

## 2012-03-01 ENCOUNTER — Encounter (HOSPITAL_COMMUNITY): Payer: Medicare Other

## 2012-03-02 ENCOUNTER — Ambulatory Visit (HOSPITAL_COMMUNITY)
Admission: RE | Admit: 2012-03-02 | Discharge: 2012-03-02 | Disposition: A | Discharge status: Home / Self Care | Payer: Medicare Other | Source: Ambulatory Visit | Attending: Family Medicine | Admitting: Family Medicine

## 2012-03-02 DIAGNOSIS — R928 Other abnormal and inconclusive findings on diagnostic imaging of breast: Secondary | ICD-10-CM | POA: Insufficient documentation

## 2012-03-03 DIAGNOSIS — Z0000 Encounter for general adult medical examination without abnormal findings: Secondary | ICD-10-CM | POA: Diagnosis not present

## 2012-03-20 DIAGNOSIS — H43819 Vitreous degeneration, unspecified eye: Secondary | ICD-10-CM | POA: Diagnosis not present

## 2012-03-20 DIAGNOSIS — H2510 Age-related nuclear cataract, unspecified eye: Secondary | ICD-10-CM | POA: Diagnosis not present

## 2012-03-20 DIAGNOSIS — H35369 Drusen (degenerative) of macula, unspecified eye: Secondary | ICD-10-CM | POA: Diagnosis not present

## 2012-05-03 ENCOUNTER — Other Ambulatory Visit (HOSPITAL_COMMUNITY): Payer: Self-pay | Admitting: Family Medicine

## 2012-05-05 ENCOUNTER — Ambulatory Visit (HOSPITAL_COMMUNITY)
Admission: RE | Admit: 2012-05-05 | Discharge: 2012-05-05 | Disposition: A | Discharge status: Home / Self Care | Payer: Medicare Other | Source: Ambulatory Visit | Attending: Family Medicine | Admitting: Family Medicine

## 2012-05-05 DIAGNOSIS — I658 Occlusion and stenosis of other precerebral arteries: Secondary | ICD-10-CM | POA: Diagnosis not present

## 2012-05-05 DIAGNOSIS — I2510 Atherosclerotic heart disease of native coronary artery without angina pectoris: Secondary | ICD-10-CM | POA: Diagnosis not present

## 2012-05-05 DIAGNOSIS — I6529 Occlusion and stenosis of unspecified carotid artery: Secondary | ICD-10-CM | POA: Diagnosis not present

## 2012-05-05 DIAGNOSIS — I739 Peripheral vascular disease, unspecified: Secondary | ICD-10-CM | POA: Diagnosis not present

## 2012-05-10 DIAGNOSIS — I10 Essential (primary) hypertension: Secondary | ICD-10-CM | POA: Diagnosis not present

## 2012-05-10 DIAGNOSIS — I739 Peripheral vascular disease, unspecified: Secondary | ICD-10-CM | POA: Diagnosis not present

## 2012-07-11 DIAGNOSIS — M5137 Other intervertebral disc degeneration, lumbosacral region: Secondary | ICD-10-CM | POA: Diagnosis not present

## 2012-07-11 DIAGNOSIS — M25559 Pain in unspecified hip: Secondary | ICD-10-CM | POA: Diagnosis not present

## 2012-07-11 DIAGNOSIS — M1710 Unilateral primary osteoarthritis, unspecified knee: Secondary | ICD-10-CM | POA: Diagnosis not present

## 2012-09-07 ENCOUNTER — Telehealth: Payer: Self-pay | Admitting: Family Medicine

## 2012-09-07 DIAGNOSIS — E782 Mixed hyperlipidemia: Secondary | ICD-10-CM

## 2012-09-07 DIAGNOSIS — Z79899 Other long term (current) drug therapy: Secondary | ICD-10-CM

## 2012-09-07 NOTE — Telephone Encounter (Addendum)
Blood work papers printed and left up front for patient pick up. Patient notified. 

## 2012-09-07 NOTE — Telephone Encounter (Signed)
Met 7, lipid 

## 2012-09-07 NOTE — Telephone Encounter (Signed)
Patient needs BW paperwork °

## 2012-09-11 ENCOUNTER — Encounter: Payer: Self-pay | Admitting: *Deleted

## 2012-09-11 DIAGNOSIS — Z79899 Other long term (current) drug therapy: Secondary | ICD-10-CM | POA: Diagnosis not present

## 2012-09-11 DIAGNOSIS — E782 Mixed hyperlipidemia: Secondary | ICD-10-CM | POA: Diagnosis not present

## 2012-09-11 LAB — LIPID PANEL
Cholesterol: 216 mg/dL — ABNORMAL HIGH (ref 0–200)
HDL: 88 mg/dL (ref >39)
LDL Cholesterol: 110 mg/dL — ABNORMAL HIGH (ref 0–99)
Triglycerides: 91 mg/dL (ref <150)

## 2012-09-11 LAB — BASIC METABOLIC PANEL
Chloride: 102 mEq/L (ref 96–112)
Creat: 0.78 mg/dL (ref 0.50–1.10)
Potassium: 4.7 mEq/L (ref 3.5–5.3)

## 2012-09-13 DIAGNOSIS — Q828 Other specified congenital malformations of skin: Secondary | ICD-10-CM | POA: Diagnosis not present

## 2012-09-18 DIAGNOSIS — H2510 Age-related nuclear cataract, unspecified eye: Secondary | ICD-10-CM | POA: Diagnosis not present

## 2012-09-20 ENCOUNTER — Encounter: Payer: Self-pay | Admitting: Family Medicine

## 2012-09-20 ENCOUNTER — Ambulatory Visit (INDEPENDENT_AMBULATORY_CARE_PROVIDER_SITE_OTHER): Payer: Medicare Other | Admitting: Family Medicine

## 2012-09-20 VITALS — BP 110/64 | HR 80 | Wt 141.0 lb

## 2012-09-20 DIAGNOSIS — I10 Essential (primary) hypertension: Secondary | ICD-10-CM | POA: Insufficient documentation

## 2012-09-20 DIAGNOSIS — I779 Disorder of arteries and arterioles, unspecified: Secondary | ICD-10-CM

## 2012-09-20 DIAGNOSIS — E785 Hyperlipidemia, unspecified: Secondary | ICD-10-CM | POA: Diagnosis not present

## 2012-09-20 NOTE — Progress Notes (Signed)
  Subjective:    Patient ID: [name], female    DOB: Nov 19, 1928, 79 y.o.   MRN: [id number]  HPI Here for a 6 month check up and follow up on blood work. She has no concerns today. Patient overall is doing better share her energy level is good she is eating well her lab work actually looks very good LDL slightly elevated compared where was HDL slightly down but she denies any claudication symptoms denies headaches fever chills sweats or chest pains. She does try to eat healthy and stays active. PMH benign-HTN/hyperlipidemia/mild carotid artery disease Review of Systems See above    Objective:   Physical Exam Lungs are clear no crackles heart is regular pulses normal blood pressure is very good extremities no edema skin warm dry       Assessment & Plan:  Hyperlipidemia-slight elevation of LDL but his her HDL looks great no need for any medication recheck her again in 6 months HTN great control no sign of any additional issues Minimal carotid artery disease no bruits heard on today's visit followup ultrasound in February

## 2012-09-27 ENCOUNTER — Ambulatory Visit (INDEPENDENT_AMBULATORY_CARE_PROVIDER_SITE_OTHER): Payer: Medicare Other | Admitting: Nurse Practitioner

## 2012-09-27 ENCOUNTER — Encounter: Payer: Self-pay | Admitting: Nurse Practitioner

## 2012-09-27 VITALS — BP 108/58 | Temp 97.9°F

## 2012-09-27 DIAGNOSIS — J309 Allergic rhinitis, unspecified: Secondary | ICD-10-CM

## 2012-09-27 DIAGNOSIS — J300 Vasomotor rhinitis: Secondary | ICD-10-CM

## 2012-09-27 DIAGNOSIS — R599 Enlarged lymph nodes, unspecified: Secondary | ICD-10-CM

## 2012-09-27 DIAGNOSIS — R591 Generalized enlarged lymph nodes: Secondary | ICD-10-CM

## 2012-09-27 NOTE — Progress Notes (Signed)
Subjective:  Presents for complaints of a "knot" on left-sided neck for the past 3 days. Minimal tenderness. No fevers. No weight loss. No cough runny nose headache sore throat or ear pain. No wheezing. Has had some mild tenderness in the mouth because of her dentures, her dentist has made some adjustments this has improved. Symptoms were more on the left side.  Objective:   BP 108/58  Temp(Src) 97.9 F (36.6 C) (Oral) NAD. Alert, oriented. TMs significant clear effusion, no erythema. Pharynx injected with cloudy PND noted. Oral mucosa normal. Neck supple with moderate soft anterior cervical and tonsillar adenopathy, minimal tenderness on the left side. Lymph nodes are fluctuant, no abnormal lymph nodes noted. No supraclavicular lymph nodes noted. Lungs clear. Heart regular rate rhythm.  Assessment:Vasomotor rhinitis  Lymphadenopathy  most likely secondary to rhinitis and/or dental issues  Plan: Antihistamines as directed. Expect gradual resolution of enlarged lymph nodes. Call back if any further problems.

## 2012-11-15 ENCOUNTER — Other Ambulatory Visit: Payer: Self-pay | Admitting: Family Medicine

## 2013-01-30 ENCOUNTER — Ambulatory Visit (INDEPENDENT_AMBULATORY_CARE_PROVIDER_SITE_OTHER): Payer: Medicare Other

## 2013-01-30 DIAGNOSIS — Z23 Encounter for immunization: Secondary | ICD-10-CM

## 2013-02-19 ENCOUNTER — Ambulatory Visit (INDEPENDENT_AMBULATORY_CARE_PROVIDER_SITE_OTHER): Payer: Medicare Other | Admitting: Family Medicine

## 2013-02-19 ENCOUNTER — Encounter: Payer: Self-pay | Admitting: Family Medicine

## 2013-02-19 VITALS — BP 120/62 | Ht 64.5 in

## 2013-02-19 DIAGNOSIS — R928 Other abnormal and inconclusive findings on diagnostic imaging of breast: Secondary | ICD-10-CM | POA: Diagnosis not present

## 2013-02-19 DIAGNOSIS — R921 Mammographic calcification found on diagnostic imaging of breast: Secondary | ICD-10-CM

## 2013-02-19 NOTE — Progress Notes (Signed)
   Subjective:    Patient ID: [name], female    DOB: 1928-10-29, 79 y.o.   MRN: [id number]  [facility] diagnostic mammogram last 02-17-13. Screening mammogram was recommended in one year. Pt would like to skip screening mammo and have a diagnostic.   PMH benign  Review of Systems No chest pain shortness breath nausea vomiting diarrhea    Objective:   Physical Exam Breast exam normal bilateral some fibrous tissue noted in the right breast lungs are clear hearts regular pulse normal extremities no edema neurologic grossly normal       Assessment & Plan:  Microcalcifications right breast with fibrous tissue I believe it is reasonable to do diagnostic mammogram as a followup as well as screening mammogram for both breasts await the results.

## 2013-02-21 DIAGNOSIS — G589 Mononeuropathy, unspecified: Secondary | ICD-10-CM | POA: Diagnosis not present

## 2013-03-21 ENCOUNTER — Ambulatory Visit (HOSPITAL_COMMUNITY)
Admission: RE | Admit: 2013-03-21 | Discharge: 2013-03-21 | Disposition: A | Discharge status: Home / Self Care | Payer: Medicare Other | Source: Ambulatory Visit | Attending: Family Medicine | Admitting: Family Medicine

## 2013-03-21 ENCOUNTER — Telehealth: Payer: Self-pay | Admitting: Family Medicine

## 2013-03-21 ENCOUNTER — Other Ambulatory Visit: Payer: Self-pay | Admitting: *Deleted

## 2013-03-21 DIAGNOSIS — R921 Mammographic calcification found on diagnostic imaging of breast: Secondary | ICD-10-CM

## 2013-03-21 DIAGNOSIS — E785 Hyperlipidemia, unspecified: Secondary | ICD-10-CM

## 2013-03-21 DIAGNOSIS — R922 Inconclusive mammogram: Secondary | ICD-10-CM | POA: Diagnosis not present

## 2013-03-21 DIAGNOSIS — R928 Other abnormal and inconclusive findings on diagnostic imaging of breast: Secondary | ICD-10-CM | POA: Diagnosis not present

## 2013-03-21 NOTE — Telephone Encounter (Signed)
Patient notified

## 2013-03-21 NOTE — Telephone Encounter (Signed)
Patient needs BW paperwork °

## 2013-03-21 NOTE — Telephone Encounter (Signed)
Labs last done: Lipid and Met7 on 06/30

## 2013-03-21 NOTE — Telephone Encounter (Signed)
Lipid profile is all she needs currently

## 2013-04-02 DIAGNOSIS — E785 Hyperlipidemia, unspecified: Secondary | ICD-10-CM | POA: Diagnosis not present

## 2013-04-02 LAB — LIPID PANEL
CHOLESTEROL: 204 mg/dL — AB (ref 0–200)
HDL: 103 mg/dL (ref >39)
LDL Cholesterol: 90 mg/dL (ref 0–99)
TRIGLYCERIDES: 55 mg/dL (ref <150)
Total CHOL/HDL Ratio: 2 Ratio
VLDL: 11 mg/dL (ref 0–40)

## 2013-04-05 ENCOUNTER — Encounter: Payer: Self-pay | Admitting: Family Medicine

## 2013-04-05 ENCOUNTER — Ambulatory Visit (INDEPENDENT_AMBULATORY_CARE_PROVIDER_SITE_OTHER): Payer: Medicare Other | Admitting: Family Medicine

## 2013-04-05 VITALS — BP 120/68 | Ht 62.0 in | Wt 151.0 lb

## 2013-04-05 DIAGNOSIS — I779 Disorder of arteries and arterioles, unspecified: Secondary | ICD-10-CM | POA: Diagnosis not present

## 2013-04-05 DIAGNOSIS — I739 Peripheral vascular disease, unspecified: Principal | ICD-10-CM

## 2013-04-05 DIAGNOSIS — Z0000 Encounter for general adult medical examination without abnormal findings: Secondary | ICD-10-CM | POA: Diagnosis not present

## 2013-04-05 NOTE — Progress Notes (Signed)
   Subjective:    Patient ID: [name], female    DOB: [birth year], 80 y.o.   MRN: [id number]  HPI AWV- Annual Wellness Visit  The patient was seen for their annual wellness visit. The patient's past medical history, surgical history, and family history were reviewed. Pertinent vaccines were reviewed ( tetanus, pneumonia, shingles, flu) The patient's medication list was reviewed and updated. The height and weight were entered. The patient's current BMI is: 27.62  Cognitive screening was completed. Outcome of Mini - Cog:  Passed   Falls within the past 6 months: none Current tobacco usage: non-smoker Waist circumference: 39 inches (All patients who use tobacco were given written and verbal information on quitting)  Recent listing of emergency department/hospitalizations over the past year were reviewed.  current specialist the patient sees on a regular basis:   Medicare annual wellness visit patient questionnaire was reviewed.  A written screening schedule for the patient for the next 5-10 years was given. Appropriate discussion of followup regarding next visit was discussed.      Review of Systems  Constitutional: Negative for activity change, appetite change and fatigue.  HENT: Negative for congestion, ear discharge and rhinorrhea.   Eyes: Negative for discharge.  Respiratory: Negative for cough, chest tightness and wheezing.   Cardiovascular: Negative for chest pain.  Gastrointestinal: Negative for vomiting and abdominal pain.  Genitourinary: Negative for frequency and difficulty urinating.  Musculoskeletal: Negative for neck pain.  Allergic/Immunologic: Negative for environmental allergies and food allergies.  Neurological: Negative for weakness and headaches.  Psychiatric/Behavioral: Negative for behavioral problems and agitation.       Objective:   Physical Exam  Constitutional: She is oriented to person, place, and time. She appears well-developed and  well-nourished.  HENT:  Head: Normocephalic.  Right Ear: External ear normal.  Left Ear: External ear normal.  Eyes: Pupils are equal, round, and reactive to light.  Neck: Normal range of motion. No thyromegaly present.  Cardiovascular: Normal rate, regular rhythm, normal heart sounds and intact distal pulses.   No murmur heard. Pulmonary/Chest: Effort normal and breath sounds normal. No respiratory distress. She has no wheezes.  Abdominal: Soft. Bowel sounds are normal. She exhibits no distension and no mass. There is no tenderness.  Musculoskeletal: Normal range of motion. She exhibits no edema and no tenderness.  Lymphadenopathy:    She has no cervical adenopathy.  Neurological: She is alert and oriented to person, place, and time. She exhibits normal muscle tone.  Skin: Skin is warm and dry.  Psychiatric: She has a normal mood and affect. Her behavior is normal.          Assessment & Plan:  #1 her breast exam was normal mammogram later this year #2 has history chronic disease recheck carotid ultrasound in February #3 overall good health encouraged patient to exercise try to lose a little bit await #4 up-to-date on immunizations #5 does need colonoscopy every 5 years #6 had bone density done a couple years ago it is very normal I recommend the next 1 2018

## 2013-04-06 DIAGNOSIS — H2589 Other age-related cataract: Secondary | ICD-10-CM | POA: Diagnosis not present

## 2013-04-23 ENCOUNTER — Ambulatory Visit (HOSPITAL_COMMUNITY)
Admission: RE | Admit: 2013-04-23 | Discharge: 2013-04-23 | Disposition: A | Discharge status: Home / Self Care | Payer: Medicare Other | Source: Ambulatory Visit | Attending: Family Medicine | Admitting: Family Medicine

## 2013-04-23 DIAGNOSIS — I658 Occlusion and stenosis of other precerebral arteries: Secondary | ICD-10-CM | POA: Insufficient documentation

## 2013-04-23 DIAGNOSIS — I6529 Occlusion and stenosis of unspecified carotid artery: Secondary | ICD-10-CM | POA: Diagnosis not present

## 2013-04-23 DIAGNOSIS — I739 Peripheral vascular disease, unspecified: Secondary | ICD-10-CM

## 2013-04-23 DIAGNOSIS — I779 Disorder of arteries and arterioles, unspecified: Secondary | ICD-10-CM

## 2013-05-17 ENCOUNTER — Other Ambulatory Visit: Payer: Self-pay | Admitting: Family Medicine

## 2013-07-23 ENCOUNTER — Encounter: Payer: Self-pay | Admitting: Family Medicine

## 2013-07-23 ENCOUNTER — Ambulatory Visit (INDEPENDENT_AMBULATORY_CARE_PROVIDER_SITE_OTHER): Payer: Medicare Other | Admitting: Family Medicine

## 2013-07-23 VITALS — BP 120/70 | Temp 97.6°F | Ht 64.5 in | Wt 152.4 lb

## 2013-07-23 DIAGNOSIS — T148 Other injury of unspecified body region: Secondary | ICD-10-CM | POA: Diagnosis not present

## 2013-07-23 DIAGNOSIS — W57XXXA Bitten or stung by nonvenomous insect and other nonvenomous arthropods, initial encounter: Secondary | ICD-10-CM

## 2013-07-23 NOTE — Patient Instructions (Signed)
Tick Bite Information Ticks are insects that attach themselves to the skin and draw blood for food. There are various types of ticks. Common types include wood ticks and deer ticks. Most ticks live in shrubs and grassy areas. Ticks can climb onto your body when you make contact with leaves or grass where the tick is waiting. The most common places on the body for ticks to attach themselves are the scalp, neck, armpits, waist, and groin. Most tick bites are harmless, but sometimes ticks carry germs that cause diseases. These germs can be spread to a person during the tick's feeding process. The chance of a disease spreading through a tick bite depends on:   The type of tick.  Time of year.   How long the tick is attached.   Geographic location.  HOW CAN YOU PREVENT TICK BITES? Take these steps to help prevent tick bites when you are outdoors:  Wear protective clothing. Long sleeves and long pants are best.   Wear white clothes so you can see ticks more easily.  Tuck your pant legs into your socks.   If walking on a trail, stay in the middle of the trail to avoid brushing against bushes.  Avoid walking through areas with long grass.  Put insect repellent on all exposed skin and along boot tops, pant legs, and sleeve cuffs.   Check clothing, hair, and skin repeatedly and before going inside.   Brush off any ticks that are not attached.  Take a shower or bath as soon as possible after being outdoors.  WHAT IS THE PROPER WAY TO REMOVE A TICK? Ticks should be removed as soon as possible to help prevent diseases caused by tick bites. 1. If latex gloves are available, put them on before trying to remove a tick.  2. Using fine-point tweezers, grasp the tick as close to the skin as possible. You may also use curved forceps or a tick removal tool. Grasp the tick as close to its head as possible. Avoid grasping the tick on its body. 3. Pull gently with steady upward pressure until  the tick lets go. Do not twist the tick or jerk it suddenly. This may break off the tick's head or mouth parts. 4. Do not squeeze or crush the tick's body. This could force disease-carrying fluids from the tick into your body.  5. After the tick is removed, wash the bite area and your hands with soap and water or other disinfectant such as alcohol. 6. Apply a small amount of antiseptic cream or ointment to the bite site.  7. Wash and disinfect any instruments that were used.  Do not try to remove a tick by applying a hot match, petroleum jelly, or fingernail polish to the tick. These methods do not work and may increase the chances of disease being spread from the tick bite.  WHEN SHOULD YOU SEEK MEDICAL CARE? Contact your health care provider if you are unable to remove a tick from your skin or if a part of the tick breaks off and is stuck in the skin.  After a tick bite, you need to be aware of signs and symptoms that could be related to diseases spread by ticks. Contact your health care provider if you develop any of the following in the days or weeks after the tick bite:  Unexplained fever.  Rash. A circular rash that appears days or weeks after the tick bite may indicate the possibility of Lyme disease. The rash may resemble   a target with a bull's-eye and may occur at a different part of your body than the tick bite.  Redness and swelling in the area of the tick bite.   Tender, swollen lymph glands.   Diarrhea.   Weight loss.   Cough.   Fatigue.   Muscle, joint, or bone pain.   Abdominal pain.   Headache.   Lethargy or a change in your level of consciousness.  Difficulty walking or moving your legs.   Numbness in the legs.   Paralysis.  Shortness of breath.   Confusion.   Repeated vomiting.  Document Released: 02/27/2000 Document Revised: 12/20/2012 Document Reviewed: 08/09/2012 [employer] Patient Information 2014 [employer].  

## 2013-07-23 NOTE — Progress Notes (Signed)
   Subjective:    Patient ID: [name], female    DOB: 08/14/1928, 80 y.o.   MRN: [id number]  HPI Patient is here today for a tick bite on her lower abdomen. Patient noticed this for the first time last week.    Review of Systems Denies headache fever chills myalgias.    Objective:   Physical Exam   Small tic noted in the left groin region no sign of rash or cellulitis patient not toxic    Assessment & Plan:  Tick bite-tick was removed without difficulty educational material given educational discussion held patient to followup if any fevers muscle aches headaches were significant rashes call if any problems

## 2013-08-09 ENCOUNTER — Ambulatory Visit: Payer: Medicare Other | Admitting: Family Medicine

## 2013-08-09 DIAGNOSIS — M79609 Pain in unspecified limb: Secondary | ICD-10-CM | POA: Diagnosis not present

## 2013-08-09 DIAGNOSIS — G5760 Lesion of plantar nerve, unspecified lower limb: Secondary | ICD-10-CM | POA: Diagnosis not present

## 2013-08-09 DIAGNOSIS — M25579 Pain in unspecified ankle and joints of unspecified foot: Secondary | ICD-10-CM | POA: Diagnosis not present

## 2013-08-13 ENCOUNTER — Encounter: Payer: Self-pay | Admitting: Family Medicine

## 2013-08-13 ENCOUNTER — Ambulatory Visit (INDEPENDENT_AMBULATORY_CARE_PROVIDER_SITE_OTHER): Payer: Medicare Other | Admitting: Family Medicine

## 2013-08-13 VITALS — BP 132/80 | Ht 64.75 in | Wt 152.0 lb

## 2013-08-13 DIAGNOSIS — R739 Hyperglycemia, unspecified: Secondary | ICD-10-CM | POA: Insufficient documentation

## 2013-08-13 DIAGNOSIS — Z8739 Personal history of other diseases of the musculoskeletal system and connective tissue: Secondary | ICD-10-CM

## 2013-08-13 DIAGNOSIS — E785 Hyperlipidemia, unspecified: Secondary | ICD-10-CM

## 2013-08-13 DIAGNOSIS — G609 Hereditary and idiopathic neuropathy, unspecified: Secondary | ICD-10-CM

## 2013-08-13 DIAGNOSIS — R635 Abnormal weight gain: Secondary | ICD-10-CM

## 2013-08-13 DIAGNOSIS — G629 Polyneuropathy, unspecified: Secondary | ICD-10-CM | POA: Insufficient documentation

## 2013-08-13 DIAGNOSIS — R7309 Other abnormal glucose: Secondary | ICD-10-CM

## 2013-08-13 NOTE — Progress Notes (Signed)
Subjective:    Patient ID: [name], female    DOB: 1928/04/22, 80 y.o.   MRN: [id number]  HPI Patient is here today to discuss ways on how to lose weight.   She said she has been trying to lose weight since January, and she fluctuates from 148-149.6 at home. In January, she weighed 149.6 lbs.  Pt has been doing: Portion control Food diary Eating 1700 cals/day Walking for exercise (limited d/t knee) Patient is limited on aerobic activity because of her knee problems but otherwise she does try to stay physically active she does not do any weight training.  Patient was concerned if diabetes could be causing some of the issue.  She also relates that she was seen a specialist at [facility] for her lungs she stated that they told her that it would be best for her to be on antibiotic when she starts getting a chest cold. She requested having a refill of Zithromax given to her.    Review of Systems She denies reflux issues chest pain shortness breath denies dry skin constipation denies swallowing difficulties.    Objective:   Physical Exam Lungs are clear heart is regular pulse normal neck no masses extremities no edema skin warm dry vital signs stable       Assessment & Plan:  #1 intermittent pulmonary infections-I. told the patient I feel it is best for her to be seen when she feel she has an infection for [place] to do an evaluation #2 weight gain [name] could be playing some issue but she uses for her neuropathy and she does not want to go off of it she only takes 2 per evening. She will watch her diet she will maternal her diet she will exercise on a regular basis and do weight exercising a couple days a week in addition to this we will check lab work to look at thyroid function. #3 she has a history of hyperlipidemia we will check a lipid profile #4 has history hyperglycemia we'll check hemoglobin A1c #5 wellness exam in July

## 2013-08-13 NOTE — Patient Instructions (Signed)
2 times a week add muscle exercises to your routine  Keep cal count 1600 or less  Do your labs  Wellness in July

## 2013-08-15 ENCOUNTER — Telehealth: Payer: Self-pay | Admitting: Family Medicine

## 2013-08-15 DIAGNOSIS — M810 Age-related osteoporosis without current pathological fracture: Secondary | ICD-10-CM

## 2013-08-15 DIAGNOSIS — E785 Hyperlipidemia, unspecified: Secondary | ICD-10-CM | POA: Diagnosis not present

## 2013-08-15 DIAGNOSIS — R7309 Other abnormal glucose: Secondary | ICD-10-CM | POA: Diagnosis not present

## 2013-08-15 DIAGNOSIS — G609 Hereditary and idiopathic neuropathy, unspecified: Secondary | ICD-10-CM | POA: Diagnosis not present

## 2013-08-15 DIAGNOSIS — R635 Abnormal weight gain: Secondary | ICD-10-CM | POA: Diagnosis not present

## 2013-08-15 LAB — LIPID PANEL
Cholesterol: 198 mg/dL (ref 0–200)
HDL: 84 mg/dL (ref >39)
LDL CALC: 99 mg/dL (ref 0–99)
TRIGLYCERIDES: 73 mg/dL (ref <150)
Total CHOL/HDL Ratio: 2.4 Ratio
VLDL: 15 mg/dL (ref 0–40)

## 2013-08-15 LAB — T4, FREE: FREE T4: 1.01 ng/dL (ref 0.80–1.80)

## 2013-08-15 LAB — HEMOGLOBIN A1C
Hgb A1c MFr Bld: 5.9 % — ABNORMAL HIGH (ref <5.7)
Mean Plasma Glucose: 123 mg/dL — ABNORMAL HIGH (ref <117)

## 2013-08-15 LAB — BASIC METABOLIC PANEL
BUN: 21 mg/dL (ref 6–23)
CHLORIDE: 102 meq/L (ref 96–112)
CO2: 31 mEq/L (ref 19–32)
Calcium: 10.2 mg/dL (ref 8.4–10.5)
Creat: 0.75 mg/dL (ref 0.50–1.10)
Glucose, Bld: 93 mg/dL (ref 70–99)
POTASSIUM: 4.7 meq/L (ref 3.5–5.3)
Sodium: 140 mEq/L (ref 135–145)

## 2013-08-15 LAB — TSH: TSH: 2.066 u[IU]/mL (ref 0.350–4.500)

## 2013-08-15 NOTE — Telephone Encounter (Signed)
°  Pt states that she tried to make an appt for bone density today as advised by the doc on  08/14/13 but was told she would need an order sent to them first before she could Make a appt.   [facility]

## 2013-08-15 NOTE — Telephone Encounter (Signed)
Did you want this ordered?

## 2013-08-15 NOTE — Telephone Encounter (Signed)
Order for bone density put in. Pt wants to schedule her own test. Pt advised to scheduled after June 18th.

## 2013-08-15 NOTE — Telephone Encounter (Signed)
She may do bone density but will not be 2 years till 6/18 so test must be that date or later

## 2013-08-30 DIAGNOSIS — M79609 Pain in unspecified limb: Secondary | ICD-10-CM | POA: Diagnosis not present

## 2013-08-30 DIAGNOSIS — G5760 Lesion of plantar nerve, unspecified lower limb: Secondary | ICD-10-CM | POA: Diagnosis not present

## 2013-08-31 ENCOUNTER — Ambulatory Visit (HOSPITAL_COMMUNITY)
Admission: RE | Admit: 2013-08-31 | Discharge: 2013-08-31 | Disposition: A | Discharge status: Home / Self Care | Payer: Medicare Other | Source: Ambulatory Visit | Attending: Family Medicine | Admitting: Family Medicine

## 2013-08-31 DIAGNOSIS — M899 Disorder of bone, unspecified: Secondary | ICD-10-CM | POA: Diagnosis not present

## 2013-08-31 DIAGNOSIS — M949 Disorder of cartilage, unspecified: Secondary | ICD-10-CM | POA: Diagnosis not present

## 2013-08-31 DIAGNOSIS — Z780 Asymptomatic menopausal state: Secondary | ICD-10-CM | POA: Diagnosis not present

## 2013-09-18 ENCOUNTER — Encounter: Payer: Self-pay | Admitting: Family Medicine

## 2013-09-18 ENCOUNTER — Ambulatory Visit (INDEPENDENT_AMBULATORY_CARE_PROVIDER_SITE_OTHER): Payer: Medicare Other | Admitting: Family Medicine

## 2013-09-18 VITALS — BP 130/70 | Ht 62.5 in | Wt 150.0 lb

## 2013-09-18 DIAGNOSIS — M949 Disorder of cartilage, unspecified: Secondary | ICD-10-CM

## 2013-09-18 DIAGNOSIS — M899 Disorder of bone, unspecified: Secondary | ICD-10-CM | POA: Diagnosis not present

## 2013-09-18 DIAGNOSIS — M8580 Other specified disorders of bone density and structure, unspecified site: Secondary | ICD-10-CM | POA: Insufficient documentation

## 2013-09-18 DIAGNOSIS — R7309 Other abnormal glucose: Secondary | ICD-10-CM

## 2013-09-18 DIAGNOSIS — R739 Hyperglycemia, unspecified: Secondary | ICD-10-CM

## 2013-09-18 DIAGNOSIS — I10 Essential (primary) hypertension: Secondary | ICD-10-CM

## 2013-09-18 DIAGNOSIS — G629 Polyneuropathy, unspecified: Secondary | ICD-10-CM

## 2013-09-18 DIAGNOSIS — Z23 Encounter for immunization: Secondary | ICD-10-CM | POA: Diagnosis not present

## 2013-09-18 DIAGNOSIS — G609 Hereditary and idiopathic neuropathy, unspecified: Secondary | ICD-10-CM

## 2013-09-18 HISTORY — DX: Other specified disorders of bone density and structure, unspecified site: M85.80

## 2013-09-18 NOTE — Progress Notes (Signed)
   Subjective:    Patient ID: [name], female    DOB: 19-Jul-1928, 80 y.o.   MRN: [id number]  Hypertension This is a chronic problem. The current episode started more than 1 year ago. The problem has been gradually improving since onset. The problem is controlled. Pertinent negatives include no chest pain. There are no associated agents to hypertension. There are no known risk factors for coronary artery disease. Treatments tried: lisinopril. The current treatment provides significant improvement. There are no compliance problems.   Patient has had labwork and bone density test completed.   Patient states she has no concerns at this time.  She is concerned though about her sugar she wants to know how that's doing heavy A1c is doing she also wants to know how her bone densities doing and her cholesterol. She is frustrated that she is not losing weight she is trying to watch how she is she trying to stay physically active   Review of Systems  Constitutional: Negative for activity change, appetite change and fatigue.  Respiratory: Negative for cough and choking.   Cardiovascular: Negative for chest pain.  Gastrointestinal: Negative for abdominal pain.  Endocrine: Negative for polydipsia and polyphagia.  Genitourinary: Negative for frequency.  Neurological: Negative for weakness.  Psychiatric/Behavioral: Negative for confusion.       Objective:   Physical Exam  Vitals reviewed. Constitutional: She appears well-nourished. No distress.  Cardiovascular: Normal rate, regular rhythm and normal heart sounds.   No murmur heard. Pulmonary/Chest: Effort normal and breath sounds normal. No respiratory distress.  Musculoskeletal: She exhibits no edema.  Lymphadenopathy:    She has no cervical adenopathy.  Neurological: She is alert. She exhibits normal muscle tone.  Psychiatric: Her behavior is normal.    25 minutes spent with patient reviewing over all of her issues in questions.  Patient actually doing very well. Very kind      Assessment & Plan:  #1 prediabetes-overall A1c good control minimize starches and diet stay physically active #2 HTN good control continue current measures stay physically active watch diet #3 osteopenia does not need to be back on Fosamax calcium vitamin D is adequate Lipid status overall lipid control is excellent no need for any medications

## 2013-10-04 DIAGNOSIS — H04129 Dry eye syndrome of unspecified lacrimal gland: Secondary | ICD-10-CM | POA: Diagnosis not present

## 2013-10-04 DIAGNOSIS — H35379 Puckering of macula, unspecified eye: Secondary | ICD-10-CM | POA: Diagnosis not present

## 2013-10-04 DIAGNOSIS — H2510 Age-related nuclear cataract, unspecified eye: Secondary | ICD-10-CM | POA: Diagnosis not present

## 2013-10-04 DIAGNOSIS — H353190 Nonexudative age-related macular degeneration, unspecified eye, stage unspecified: Secondary | ICD-10-CM | POA: Diagnosis not present

## 2013-11-14 DIAGNOSIS — S90129A Contusion of unspecified lesser toe(s) without damage to nail, initial encounter: Secondary | ICD-10-CM | POA: Diagnosis not present

## 2013-11-14 DIAGNOSIS — E1149 Type 2 diabetes mellitus with other diabetic neurological complication: Secondary | ICD-10-CM | POA: Diagnosis not present

## 2013-11-14 DIAGNOSIS — L851 Acquired keratosis [keratoderma] palmaris et plantaris: Secondary | ICD-10-CM | POA: Diagnosis not present

## 2013-11-14 DIAGNOSIS — M2040 Other hammer toe(s) (acquired), unspecified foot: Secondary | ICD-10-CM | POA: Diagnosis not present

## 2013-11-17 ENCOUNTER — Other Ambulatory Visit: Payer: Self-pay | Admitting: Family Medicine

## 2013-12-12 ENCOUNTER — Ambulatory Visit (INDEPENDENT_AMBULATORY_CARE_PROVIDER_SITE_OTHER): Payer: Medicare Other

## 2013-12-12 DIAGNOSIS — Z23 Encounter for immunization: Secondary | ICD-10-CM | POA: Diagnosis not present

## 2014-01-23 DIAGNOSIS — I739 Peripheral vascular disease, unspecified: Secondary | ICD-10-CM | POA: Diagnosis not present

## 2014-01-23 DIAGNOSIS — Q828 Other specified congenital malformations of skin: Secondary | ICD-10-CM | POA: Diagnosis not present

## 2014-01-23 DIAGNOSIS — M79671 Pain in right foot: Secondary | ICD-10-CM | POA: Diagnosis not present

## 2014-01-23 DIAGNOSIS — M2042 Other hammer toe(s) (acquired), left foot: Secondary | ICD-10-CM | POA: Diagnosis not present

## 2014-02-20 DIAGNOSIS — G629 Polyneuropathy, unspecified: Secondary | ICD-10-CM | POA: Diagnosis not present

## 2014-02-21 ENCOUNTER — Other Ambulatory Visit: Payer: Self-pay | Admitting: Family Medicine

## 2014-02-21 DIAGNOSIS — Z1231 Encounter for screening mammogram for malignant neoplasm of breast: Secondary | ICD-10-CM

## 2014-03-11 ENCOUNTER — Telehealth: Payer: Self-pay | Admitting: Family Medicine

## 2014-03-11 DIAGNOSIS — Z79899 Other long term (current) drug therapy: Secondary | ICD-10-CM

## 2014-03-11 DIAGNOSIS — I10 Essential (primary) hypertension: Secondary | ICD-10-CM

## 2014-03-11 DIAGNOSIS — R739 Hyperglycemia, unspecified: Secondary | ICD-10-CM

## 2014-03-11 NOTE — Telephone Encounter (Signed)
Pt would like to get her labs sent so that she can have them done before her appt on the 25th of Jan   Last labs 08/15/13 A1C, TSH, T4,free, BMP, Lipid

## 2014-03-11 NOTE — Telephone Encounter (Signed)
Met 7 lipid and A1C

## 2014-03-11 NOTE — Telephone Encounter (Signed)
Patient notified that bloodwork has been ordered.

## 2014-03-21 DIAGNOSIS — L309 Dermatitis, unspecified: Secondary | ICD-10-CM | POA: Diagnosis not present

## 2014-03-28 ENCOUNTER — Ambulatory Visit (HOSPITAL_COMMUNITY): Payer: Medicare Other

## 2014-03-29 ENCOUNTER — Ambulatory Visit (HOSPITAL_COMMUNITY)
Admission: RE | Admit: 2014-03-29 | Discharge: 2014-03-29 | Disposition: A | Discharge status: Home / Self Care | Payer: Medicare Other | Source: Ambulatory Visit | Attending: Family Medicine | Admitting: Family Medicine

## 2014-03-29 DIAGNOSIS — R928 Other abnormal and inconclusive findings on diagnostic imaging of breast: Secondary | ICD-10-CM | POA: Insufficient documentation

## 2014-03-29 DIAGNOSIS — Z1231 Encounter for screening mammogram for malignant neoplasm of breast: Secondary | ICD-10-CM | POA: Diagnosis not present

## 2014-04-02 ENCOUNTER — Other Ambulatory Visit: Payer: Self-pay | Admitting: Family Medicine

## 2014-04-02 DIAGNOSIS — R739 Hyperglycemia, unspecified: Secondary | ICD-10-CM | POA: Diagnosis not present

## 2014-04-02 DIAGNOSIS — Z79899 Other long term (current) drug therapy: Secondary | ICD-10-CM | POA: Diagnosis not present

## 2014-04-02 DIAGNOSIS — R928 Other abnormal and inconclusive findings on diagnostic imaging of breast: Secondary | ICD-10-CM

## 2014-04-02 DIAGNOSIS — I10 Essential (primary) hypertension: Secondary | ICD-10-CM | POA: Diagnosis not present

## 2014-04-02 LAB — BASIC METABOLIC PANEL
BUN: 21 mg/dL (ref 6–23)
CHLORIDE: 101 meq/L (ref 96–112)
CO2: 32 mEq/L (ref 19–32)
Calcium: 10.7 mg/dL — ABNORMAL HIGH (ref 8.4–10.5)
Creat: 0.9 mg/dL (ref 0.50–1.10)
Glucose, Bld: 102 mg/dL — ABNORMAL HIGH (ref 70–99)
Potassium: 5.8 mEq/L — ABNORMAL HIGH (ref 3.5–5.3)
Sodium: 141 mEq/L (ref 135–145)

## 2014-04-02 LAB — HEMOGLOBIN A1C
HEMOGLOBIN A1C: 6 % — AB (ref <5.7)
MEAN PLASMA GLUCOSE: 126 mg/dL — AB (ref <117)

## 2014-04-02 LAB — LIPID PANEL
Cholesterol: 221 mg/dL — ABNORMAL HIGH (ref 0–200)
HDL: 84 mg/dL (ref >39)
LDL Cholesterol: 122 mg/dL — ABNORMAL HIGH (ref 0–99)
Total CHOL/HDL Ratio: 2.6 Ratio
Triglycerides: 74 mg/dL (ref <150)
VLDL: 15 mg/dL (ref 0–40)

## 2014-04-03 ENCOUNTER — Other Ambulatory Visit: Payer: Self-pay | Admitting: *Deleted

## 2014-04-03 DIAGNOSIS — E875 Hyperkalemia: Secondary | ICD-10-CM | POA: Diagnosis not present

## 2014-04-03 LAB — BASIC METABOLIC PANEL
BUN: 23 mg/dL (ref 6–23)
CHLORIDE: 104 meq/L (ref 96–112)
CO2: 31 mEq/L (ref 19–32)
Calcium: 10 mg/dL (ref 8.4–10.5)
Creat: 0.83 mg/dL (ref 0.50–1.10)
GLUCOSE: 103 mg/dL — AB (ref 70–99)
POTASSIUM: 4.5 meq/L (ref 3.5–5.3)
SODIUM: 142 meq/L (ref 135–145)

## 2014-04-03 NOTE — Telephone Encounter (Signed)
Pt notified and verbalized understanding. She will TRY to get the BW done before her OV on Monday. She said she will not drive in the snow, and I told her only to do that if she feels safe. Pt verbalized understanding.

## 2014-04-08 ENCOUNTER — Encounter: Payer: Medicare Other | Admitting: Family Medicine

## 2014-04-09 ENCOUNTER — Ambulatory Visit (HOSPITAL_COMMUNITY)
Admission: RE | Admit: 2014-04-09 | Discharge: 2014-04-09 | Disposition: A | Discharge status: Home / Self Care | Payer: Medicare Other | Source: Ambulatory Visit | Attending: Family Medicine | Admitting: Family Medicine

## 2014-04-09 ENCOUNTER — Other Ambulatory Visit: Payer: Self-pay | Admitting: Family Medicine

## 2014-04-09 ENCOUNTER — Encounter (HOSPITAL_COMMUNITY): Payer: Medicare Other

## 2014-04-09 DIAGNOSIS — R928 Other abnormal and inconclusive findings on diagnostic imaging of breast: Secondary | ICD-10-CM

## 2014-04-09 DIAGNOSIS — R922 Inconclusive mammogram: Secondary | ICD-10-CM | POA: Diagnosis not present

## 2014-04-09 DIAGNOSIS — N6489 Other specified disorders of breast: Secondary | ICD-10-CM | POA: Diagnosis not present

## 2014-04-15 DIAGNOSIS — D485 Neoplasm of uncertain behavior of skin: Secondary | ICD-10-CM | POA: Diagnosis not present

## 2014-04-16 ENCOUNTER — Ambulatory Visit (INDEPENDENT_AMBULATORY_CARE_PROVIDER_SITE_OTHER): Payer: Medicare Other | Admitting: Family Medicine

## 2014-04-16 ENCOUNTER — Encounter: Payer: Self-pay | Admitting: Family Medicine

## 2014-04-16 VITALS — BP 122/80 | Ht 62.5 in | Wt 156.4 lb

## 2014-04-16 DIAGNOSIS — I779 Disorder of arteries and arterioles, unspecified: Secondary | ICD-10-CM

## 2014-04-16 DIAGNOSIS — R739 Hyperglycemia, unspecified: Secondary | ICD-10-CM | POA: Diagnosis not present

## 2014-04-16 DIAGNOSIS — I10 Essential (primary) hypertension: Secondary | ICD-10-CM | POA: Diagnosis not present

## 2014-04-16 DIAGNOSIS — Z0000 Encounter for general adult medical examination without abnormal findings: Secondary | ICD-10-CM | POA: Diagnosis not present

## 2014-04-16 DIAGNOSIS — M8580 Other specified disorders of bone density and structure, unspecified site: Secondary | ICD-10-CM | POA: Diagnosis not present

## 2014-04-16 DIAGNOSIS — I739 Peripheral vascular disease, unspecified: Secondary | ICD-10-CM

## 2014-04-16 MED ORDER — LISINOPRIL 20 MG PO TABS: 20.0000 mg | ORAL_TABLET | Freq: Every day | ORAL | Status: DC

## 2014-04-16 MED ORDER — FUROSEMIDE 20 MG PO TABS: ORAL_TABLET | ORAL | Status: DC

## 2014-04-16 NOTE — Progress Notes (Signed)
   Subjective:    Patient ID: [name], female    DOB: 1928-04-18, 81 y.o.   MRN: [id number]  HPI AWV- Annual Wellness Visit  The patient was seen for their annual wellness visit. The patient's past medical history, surgical history, and family history were reviewed. Pertinent vaccines were reviewed ( tetanus, pneumonia, shingles, flu) The patient's medication list was reviewed and updated.  The height and weight were entered. The patient's current BMI is:28.15  Cognitive screening was completed. Outcome of Mini - Cog: passed  Falls within the past 6 months:none  Current tobacco usage: none (All patients who use tobacco were given written and verbal information on quitting)  Recent listing of emergency department/hospitalizations over the past year were reviewed.  current specialist the patient sees on a regular basis: Dr. Claris Gladden   Medicare annual wellness visit patient questionnaire was reviewed.  A written screening schedule for the patient for the next 5-10 years was given. Appropriate discussion of followup regarding next visit was discussed.  Patient states that she is concerned about her high potassium.     Review of Systems  Constitutional: Negative for activity change, appetite change and fatigue.  HENT: Negative for congestion, ear discharge and rhinorrhea.   Eyes: Negative for discharge.  Respiratory: Negative for cough, chest tightness and wheezing.   Cardiovascular: Negative for chest pain.  Gastrointestinal: Negative for vomiting and abdominal pain.  Genitourinary: Negative for frequency and difficulty urinating.  Musculoskeletal: Negative for neck pain.  Allergic/Immunologic: Negative for environmental allergies and food allergies.  Neurological: Negative for weakness and headaches.  Psychiatric/Behavioral: Negative for behavioral problems and agitation.       Objective:   Physical Exam  Constitutional: She is oriented to person, place, and  time. She appears well-developed and well-nourished.  HENT:  Head: Normocephalic.  Right Ear: External ear normal.  Left Ear: External ear normal.  Eyes: Pupils are equal, round, and reactive to light.  Neck: Normal range of motion. No thyromegaly present.  Cardiovascular: Normal rate, regular rhythm, normal heart sounds and intact distal pulses.   No murmur heard. Pulmonary/Chest: Effort normal and breath sounds normal. No respiratory distress. She has no wheezes.  Abdominal: Soft. Bowel sounds are normal. She exhibits no distension and no mass. There is no tenderness.  Musculoskeletal: Normal range of motion. She exhibits no edema or tenderness.  Lymphadenopathy:    She has no cervical adenopathy.  Neurological: She is alert and oriented to person, place, and time. She exhibits normal muscle tone.  Skin: Skin is warm and dry.  Psychiatric: She has a normal mood and affect. Her behavior is normal.   breast exam normal Pelvic exam normal Rectal exam normal Pap smear not taken.  No falls, cognitive good      Assessment & Plan:  Wellness-safety measures dietary measures all discussed. Follow-up when necessary. Patient up-to-date on colonoscopy. Hyperlipidemia under decent control watch diet closely no need to add medications Prediabetes decent control no need to add you medicines watch diet Carotid artery disease will check ultrasound in 6 months follow-up in 6 months Blood pressure under good control Osteopenia bone density in 2 years

## 2014-04-22 ENCOUNTER — Telehealth: Payer: Self-pay | Admitting: Family Medicine

## 2014-04-22 MED ORDER — CEFPROZIL 500 MG PO TABS
500.0000 mg | ORAL_TABLET | Freq: Two times a day (BID) | ORAL | Status: DC
Start: 2014-04-22 — End: 2014-05-23

## 2014-04-22 NOTE — Telephone Encounter (Signed)
cefzil 500 one bid 10 days

## 2014-04-22 NOTE — Telephone Encounter (Addendum)
Rx sent electronically to pharmacy. Patient notified and advised to follow up if worse or not improving. Patient verbalized understanding.

## 2014-04-22 NOTE — Telephone Encounter (Signed)
Pt was seen 04/16/14, was told to call back if she was Not any better by today,   Symptoms : cough, sputum, takes her breath away, hard to sleep   [place] [facility]

## 2014-04-22 NOTE — Telephone Encounter (Signed)
Patient was not given any meds and would like antibiotic for congestion and cough for over a week

## 2014-04-22 NOTE — Telephone Encounter (Signed)
Follow up if worse or not improving

## 2014-04-23 ENCOUNTER — Encounter (HOSPITAL_COMMUNITY): Payer: Medicare Other

## 2014-04-30 ENCOUNTER — Telehealth: Payer: Self-pay | Admitting: Family Medicine

## 2014-04-30 NOTE — Telephone Encounter (Signed)
Pt just wanted you to know she was feeling much better and she has  Stopped coughing with the meds you have sent in, thank you very much.

## 2014-05-02 DIAGNOSIS — H35373 Puckering of macula, bilateral: Secondary | ICD-10-CM | POA: Diagnosis not present

## 2014-05-16 ENCOUNTER — Telehealth: Payer: Self-pay | Admitting: *Deleted

## 2014-05-16 NOTE — Telephone Encounter (Signed)
Pt in [place] file for feb 2016 for repeat carotid ultrasound.

## 2014-05-17 ENCOUNTER — Telehealth: Payer: Self-pay | Admitting: *Deleted

## 2014-05-17 NOTE — Telephone Encounter (Signed)
Pt was in [place] file for feb 2016 to repeat carotid ultrasound. i called pt to see what day was best to schedule. She states at her physical it was decided to wait til July to do ultrasound. Nothing in [place] file on her to do in July. Do you want [place] to change til July.

## 2014-05-19 NOTE — Telephone Encounter (Signed)
Please change the carotid ultrasound to be done in July thank you

## 2014-05-20 NOTE — Telephone Encounter (Signed)
Repeat Carotid U/S in July added in [place] per Drs order.

## 2014-05-22 ENCOUNTER — Telehealth: Payer: Self-pay | Admitting: Family Medicine

## 2014-05-22 NOTE — Telephone Encounter (Signed)
Seen 2/2 for wellness. cefzil sent in on 2/8. Pt calling because cough started back yesterday. Coughing up clear sputum sometimes. No wheezing, no fever. Sharp pain under right breast that goes around to her back when coughing sometimes.

## 2014-05-22 NOTE — Telephone Encounter (Signed)
Based on the symptoms currently with a cough with clear phlegm but no fever I doubt that this is a bacterial process. More than likely a virus. I would watch it. If she feels more comfortable being checked we can schedule her for tomorrow so I can listen to her lungs. Currently I would not recommend renewing the antibiotic

## 2014-05-22 NOTE — Telephone Encounter (Signed)
Pt is requesting a refill on the cefprozil that she was prescribed last  Month. Pt is experiencing coughing again yesterday.   [employer]

## 2014-05-22 NOTE — Telephone Encounter (Signed)
Discussed with pt. Pt does want recheck tomorrow. Transferred to front to schedule appt tomorrow.

## 2014-05-23 ENCOUNTER — Encounter: Payer: Self-pay | Admitting: Nurse Practitioner

## 2014-05-23 ENCOUNTER — Ambulatory Visit (INDEPENDENT_AMBULATORY_CARE_PROVIDER_SITE_OTHER): Payer: Medicare Other | Admitting: Nurse Practitioner

## 2014-05-23 VITALS — BP 136/90 | Temp 98.3°F | Ht 62.5 in | Wt 157.0 lb

## 2014-05-23 DIAGNOSIS — J189 Pneumonia, unspecified organism: Secondary | ICD-10-CM | POA: Diagnosis not present

## 2014-05-23 DIAGNOSIS — B9689 Other specified bacterial agents as the cause of diseases classified elsewhere: Secondary | ICD-10-CM

## 2014-05-23 DIAGNOSIS — J069 Acute upper respiratory infection, unspecified: Secondary | ICD-10-CM

## 2014-05-23 DIAGNOSIS — J181 Lobar pneumonia, unspecified organism: Secondary | ICD-10-CM

## 2014-05-23 MED ORDER — CEFTRIAXONE SODIUM 1 G IJ SOLR
500.0000 mg | Freq: Once | INTRAMUSCULAR | Status: AC
  Administered 2014-05-23: 500 mg via INTRAMUSCULAR

## 2014-05-23 MED ORDER — LEVOFLOXACIN 500 MG PO TABS: 500.0000 mg | ORAL_TABLET | Freq: Every day | ORAL | Status: DC

## 2014-05-26 ENCOUNTER — Encounter: Payer: Self-pay | Admitting: Nurse Practitioner

## 2014-05-26 NOTE — Progress Notes (Signed)
Subjective:  Presents for c/o cough that began 2 days ago. Worse at night and with talking. Producing green/yellow sputum. No fever. Slight headache. No ear pain or wheezing. Pain along the lower right anterior chest wall under right breast. No rash. No history of injury. Took Cefzil on 2/8; was doing much better. No N/V. Taking fluids well.  Objective:   BP 136/90 mmHg  Temp(Src) 98.3 F (36.8 C)  Ht 5' 2.5" (1.588 m)  Wt 157 lb (71.215 kg)  BMI 28.24 kg/m2 NAD. Alert, oriented. TMs clear effusion. Pharynx clear. Neck supple with mild anterior adenopathy. Lungs: coarse expiratory crackles noted RLL at area of tenderness. One faint expiratory wheeze LUL, otherwise clear. No tachypnea. Normal color.   Assessment: Bacterial upper respiratory infection - Plan: cefTRIAXone (ROCEPHIN) injection 500 mg  Probable RLL pneumonia - Plan: cefTRIAXone (ROCEPHIN) injection 500 mg  Plan:  Meds ordered this encounter  Medications  . levofloxacin (LEVAQUIN) 500 MG tablet    Sig: Take 1 tablet (500 mg total) by mouth daily.    Dispense:  10 tablet    Refill:  0    Order Specific Question:  Supervising Provider    Answer:  Mikey Kirschner [[id number]]  . cefTRIAXone (ROCEPHIN) injection 500 mg    Sig:     Order Specific Question:  Antibiotic Indication:    Answer:  Other Indication (list below)    Order Specific Question:  Other Indication:    Answer:  Pneumonia   Start Levaquin in 24 hours. Mucinex DM as directed for cough. Call back in 4 days if no better, go to ED sooner if worse.

## 2014-06-03 ENCOUNTER — Telehealth: Payer: Self-pay | Admitting: Nurse Practitioner

## 2014-06-03 ENCOUNTER — Ambulatory Visit (HOSPITAL_COMMUNITY)
Admission: RE | Admit: 2014-06-03 | Discharge: 2014-06-03 | Disposition: A | Discharge status: Home / Self Care | Payer: Medicare Other | Source: Ambulatory Visit | Attending: Nurse Practitioner | Admitting: Nurse Practitioner

## 2014-06-03 DIAGNOSIS — R059 Cough, unspecified: Secondary | ICD-10-CM

## 2014-06-03 DIAGNOSIS — R05 Cough: Secondary | ICD-10-CM | POA: Diagnosis present

## 2014-06-03 NOTE — Telephone Encounter (Signed)
Last seen 05/23/14. Patient states that she is still having night sweats, pain in right lower rib cage when she coughs, coughing up yellow/clear congestion, shortness of breath, weakness and no fever. [employer]

## 2014-06-03 NOTE — Telephone Encounter (Signed)
Chest xray order in the system. Patient was notified and she is going today.

## 2014-06-03 NOTE — Telephone Encounter (Signed)
Pt called stating that she has finished the medication she was given for her pneumonia.  Pt is still experiencing night sweats and is worried that she may need more meds.  Pt is wanting to know if Hoyle Sauer wants to call her in some more medicine. She has an appt to see Hoyle Sauer on Thursday.

## 2014-06-03 NOTE — Telephone Encounter (Signed)
First I recommend a chest xray then we will go from there. Ask her to get that done today if possible. Thanks.

## 2014-06-06 ENCOUNTER — Ambulatory Visit (INDEPENDENT_AMBULATORY_CARE_PROVIDER_SITE_OTHER): Payer: Medicare Other | Admitting: Nurse Practitioner

## 2014-06-06 ENCOUNTER — Encounter: Payer: Self-pay | Admitting: Nurse Practitioner

## 2014-06-06 VITALS — BP 136/70 | Temp 98.0°F | Ht 62.5 in | Wt 157.0 lb

## 2014-06-06 DIAGNOSIS — J300 Vasomotor rhinitis: Secondary | ICD-10-CM | POA: Diagnosis not present

## 2014-06-06 DIAGNOSIS — B001 Herpesviral vesicular dermatitis: Secondary | ICD-10-CM | POA: Diagnosis not present

## 2014-06-06 MED ORDER — ACYCLOVIR 5 % EX CREA: 1.0000 "application " | TOPICAL_CREAM | CUTANEOUS | Status: DC

## 2014-06-06 MED ORDER — AZITHROMYCIN 250 MG PO TABS: ORAL_TABLET | ORAL | Status: DC

## 2014-06-06 NOTE — Patient Instructions (Signed)
Loratadine 10 mg once a day Flonase (generic) or Nasacort AQ as directed

## 2014-06-07 ENCOUNTER — Other Ambulatory Visit: Payer: Self-pay | Admitting: Nurse Practitioner

## 2014-06-07 ENCOUNTER — Encounter: Payer: Self-pay | Admitting: Nurse Practitioner

## 2014-06-07 MED ORDER — VALACYCLOVIR HCL 1 G PO TABS: ORAL_TABLET | ORAL | Status: DC

## 2014-06-07 NOTE — Progress Notes (Signed)
Subjective:  Presents for recheck. Right chest wall pain much improved. Continues to have some cough. Producing clear sputum. Head congestion. No fever. Sore throat on the left side. Occasional tenderness along the left maxillary area into the roof of her mouth only when she wears her dentures. Right ear pain. No oral lesions. No ear pain. No wheezing. Shortness of breath improved. Recently treated for pneumonia. Also complaints of a flareup of her fever blisters, has had these in the past. Responds well to topical medication.  Objective:   BP 136/70 mmHg  Temp(Src) 98 F (36.7 C) (Oral)  Ht 5' 2.5" (1.588 m)  Wt 157 lb (71.215 kg)  BMI 28.24 kg/m2 NAD. Alert, oriented. TMs retracted, no erythema. Pharynx clear. Neck supple with mild soft anterior adenopathy. Lungs clear. Heart regular rate rhythm. Rare congested cough noted. No tachypnea. Normal color. Minimal tenderness to palpation of the right mid chest wall. A few small vesicles noted along the edge of the lips. Chest x-ray dated 06/03/2014 was normal.  Assessment: Vasomotor rhinitis  Herpes labialis  Plan:  Meds ordered this encounter  Medications  . acyclovir cream (ZOVIRAX) 5 %    Sig: Apply 1 application topically every 3 (three) hours.    Dispense:  5 g    Refill:  2    Order Specific Question:  Supervising Provider    Answer:  Mikey Kirschner [[id number]]  . azithromycin (ZITHROMAX Z-PAK) 250 MG tablet    Sig: Take 2 tablets (500 mg) on  Day 1,  followed by 1 tablet (250 mg) once daily on Days 2 through 5.    Dispense:  6 each    Refill:  0    Order Specific Question:  Supervising Provider    Answer:  Mikey Kirschner [[id number]]   OTC meds as directed. Given prescription for Zithromax to have on hand during the holiday in case it is needed. Warning signs reviewed. Recheck if symptoms worsen or persist. Otherwise routine follow-up. Resume activities as tolerated.

## 2014-06-11 ENCOUNTER — Other Ambulatory Visit: Payer: Self-pay | Admitting: Nurse Practitioner

## 2014-10-08 ENCOUNTER — Telehealth: Payer: Self-pay | Admitting: Family Medicine

## 2014-10-08 DIAGNOSIS — R5383 Other fatigue: Secondary | ICD-10-CM

## 2014-10-08 DIAGNOSIS — E785 Hyperlipidemia, unspecified: Secondary | ICD-10-CM

## 2014-10-08 DIAGNOSIS — Z79899 Other long term (current) drug therapy: Secondary | ICD-10-CM

## 2014-10-08 DIAGNOSIS — R739 Hyperglycemia, unspecified: Secondary | ICD-10-CM

## 2014-10-08 NOTE — Telephone Encounter (Signed)
Pt is requesting lab orders to be sent over for her upcoming appt. Last labs were: lipid,a1c,and bmp on 04/02/14

## 2014-10-08 NOTE — Telephone Encounter (Signed)
Lipid, liver, metabolic 7, hemoglobin [id number], CBC-hyperlipidemia, hyperglycemia, fatigue

## 2014-10-09 NOTE — Telephone Encounter (Signed)
Notified patient bloodwork has been ordered.  

## 2014-10-11 DIAGNOSIS — R739 Hyperglycemia, unspecified: Secondary | ICD-10-CM | POA: Diagnosis not present

## 2014-10-11 DIAGNOSIS — E785 Hyperlipidemia, unspecified: Secondary | ICD-10-CM | POA: Diagnosis not present

## 2014-10-11 DIAGNOSIS — Z79899 Other long term (current) drug therapy: Secondary | ICD-10-CM | POA: Diagnosis not present

## 2014-10-11 DIAGNOSIS — R5383 Other fatigue: Secondary | ICD-10-CM | POA: Diagnosis not present

## 2014-10-12 LAB — LIPID PANEL
CHOL/HDL RATIO: 2.4 ratio (ref 0.0–4.4)
Cholesterol, Total: 230 mg/dL — ABNORMAL HIGH (ref 100–199)
HDL: 96 mg/dL (ref >39)
LDL Calculated: 118 mg/dL — ABNORMAL HIGH (ref 0–99)
Triglycerides: 82 mg/dL (ref 0–149)
VLDL CHOLESTEROL CAL: 16 mg/dL (ref 5–40)

## 2014-10-12 LAB — CBC WITH DIFFERENTIAL/PLATELET
BASOS ABS: 0 10*3/uL (ref 0.0–0.2)
BASOS: 1 %
EOS (ABSOLUTE): 0.1 10*3/uL (ref 0.0–0.4)
Eos: 2 %
HEMATOCRIT: 40.4 % (ref 34.0–46.6)
Hemoglobin: 13.5 g/dL (ref 11.1–15.9)
Immature Grans (Abs): 0 10*3/uL (ref 0.0–0.1)
Immature Granulocytes: 0 %
Lymphocytes Absolute: 1.8 10*3/uL (ref 0.7–3.1)
Lymphs: 31 %
MCH: 30.8 pg (ref 26.6–33.0)
MCHC: 33.4 g/dL (ref 31.5–35.7)
MCV: 92 fL (ref 79–97)
MONOS ABS: 0.5 10*3/uL (ref 0.1–0.9)
Monocytes: 9 %
NEUTROS PCT: 57 %
Neutrophils Absolute: 3.3 10*3/uL (ref 1.4–7.0)
Platelets: 290 10*3/uL (ref 150–379)
RBC: 4.38 x10E6/uL (ref 3.77–5.28)
RDW: 13.5 % (ref 12.3–15.4)
WBC: 5.7 10*3/uL (ref 3.4–10.8)

## 2014-10-12 LAB — BASIC METABOLIC PANEL
BUN / CREAT RATIO: 28 — AB (ref 11–26)
BUN: 28 mg/dL — AB (ref 8–27)
CALCIUM: 10.7 mg/dL — AB (ref 8.7–10.3)
CHLORIDE: 98 mmol/L (ref 97–108)
CO2: 26 mmol/L (ref 18–29)
Creatinine, Ser: 1.01 mg/dL — ABNORMAL HIGH (ref 0.57–1.00)
GFR, EST AFRICAN AMERICAN: 59 mL/min/{1.73_m2} — AB (ref >59)
GFR, EST NON AFRICAN AMERICAN: 52 mL/min/{1.73_m2} — AB (ref >59)
Glucose: 109 mg/dL — ABNORMAL HIGH (ref 65–99)
Potassium: 5.2 mmol/L (ref 3.5–5.2)
Sodium: 140 mmol/L (ref 134–144)

## 2014-10-12 LAB — HEMOGLOBIN A1C
Est. average glucose Bld gHb Est-mCnc: 126 mg/dL
Hgb A1c MFr Bld: 6 % — ABNORMAL HIGH (ref 4.8–5.6)

## 2014-10-12 LAB — HEPATIC FUNCTION PANEL
ALT: 26 IU/L (ref 0–32)
AST: 32 IU/L (ref 0–40)
Albumin: 4.6 g/dL (ref 3.5–4.7)
Alkaline Phosphatase: 58 IU/L (ref 39–117)
BILIRUBIN, DIRECT: 0.14 mg/dL (ref 0.00–0.40)
Bilirubin Total: 0.5 mg/dL (ref 0.0–1.2)
TOTAL PROTEIN: 7 g/dL (ref 6.0–8.5)

## 2014-10-15 ENCOUNTER — Encounter: Payer: Self-pay | Admitting: Family Medicine

## 2014-10-15 ENCOUNTER — Ambulatory Visit (INDEPENDENT_AMBULATORY_CARE_PROVIDER_SITE_OTHER): Payer: Medicare Other | Admitting: Family Medicine

## 2014-10-15 VITALS — BP 126/72 | Ht 62.5 in | Wt 152.4 lb

## 2014-10-15 DIAGNOSIS — E785 Hyperlipidemia, unspecified: Secondary | ICD-10-CM

## 2014-10-15 DIAGNOSIS — R7303 Prediabetes: Secondary | ICD-10-CM

## 2014-10-15 DIAGNOSIS — I779 Disorder of arteries and arterioles, unspecified: Secondary | ICD-10-CM

## 2014-10-15 DIAGNOSIS — M1711 Unilateral primary osteoarthritis, right knee: Secondary | ICD-10-CM

## 2014-10-15 DIAGNOSIS — I10 Essential (primary) hypertension: Secondary | ICD-10-CM

## 2014-10-15 DIAGNOSIS — M129 Arthropathy, unspecified: Secondary | ICD-10-CM

## 2014-10-15 DIAGNOSIS — R7309 Other abnormal glucose: Secondary | ICD-10-CM

## 2014-10-15 DIAGNOSIS — I739 Peripheral vascular disease, unspecified: Principal | ICD-10-CM

## 2014-10-15 MED ORDER — FUROSEMIDE 20 MG PO TABS: ORAL_TABLET | ORAL | Status: DC

## 2014-10-15 MED ORDER — AZITHROMYCIN 250 MG PO TABS: ORAL_TABLET | ORAL | Status: DC

## 2014-10-15 MED ORDER — VALACYCLOVIR HCL 1 G PO TABS: ORAL_TABLET | ORAL | Status: DC

## 2014-10-15 MED ORDER — LISINOPRIL 20 MG PO TABS: 20.0000 mg | ORAL_TABLET | Freq: Every day | ORAL | Status: DC

## 2014-10-15 NOTE — Progress Notes (Signed)
   Subjective:    Patient ID: [name], female    DOB: 07/13/1928, 81 y.o.   MRN: [id number]  Hyperglycemia This is a chronic problem. The current episode started more than 1 year ago. Pertinent negatives include no abdominal pain, chest pain, congestion, coughing, fatigue or weakness.   Patient would like to discuss pain to 4th toe of left foot.BEEN PRESENT FOR A FEW WEEKS  Right knee pain- Dr Aline Brochure arhtritis and disc in the back,  Patient has good job watching her diet she does try to stay active she does have right knee pain throws her off balance occasionally no falls from and she denies any chest pressure tightness pain she denies any stroke symptoms  Patient had recent labs drawn on 7/29 and would like to discuss results.starting to pull down Eating healthy Taking meds Keeping active  Review of Systems  Constitutional: Negative for activity change, appetite change and fatigue.  HENT: Negative for congestion.   Respiratory: Negative for cough.   Cardiovascular: Negative for chest pain.  Gastrointestinal: Negative for abdominal pain.  Endocrine: Negative for polydipsia and polyphagia.  Neurological: Negative for weakness.  Psychiatric/Behavioral: Negative for confusion.       Objective:   Physical Exam  Constitutional: She appears well-nourished. No distress.  Cardiovascular: Normal rate, regular rhythm and normal heart sounds.   No murmur heard. Pulmonary/Chest: Effort normal and breath sounds normal. No respiratory distress.  Musculoskeletal: She exhibits no edema.  Lymphadenopathy:    She has no cervical adenopathy.  Neurological: She is alert. She exhibits normal muscle tone.  Psychiatric: Her behavior is normal.  Vitals reviewed.   Hx carotid needs repeat US  bp good    Assessment & Plan:BP good  Pt will call dr Berline Lopes for ffot Right knee artthritis-aspircre, pt does nt want surgeryam  1. Bilateral carotid artery disease Needs carotid ultrasound  has been approximately 18 months has moderate disease watching diet - US Carotid Duplex Bilateral  2. Essential hypertension, benign Blood pressure under good control continue current measures check lab work - Basic metabolic panel  3. Arthritis of right knee Severe arthritis of the right knee she manages it with Tylenol does not want surgery  4. Hyperlipidemia Cholesterol overall looks very good LDL mildly elevated but her HDL is fantastic  5. Prediabetes Prediabetes good control it was recommended for the patient to repeat metabolic 7 in approximately 3-4 weeks - Basic metabolic panel  6. Hypercalcemia We will check PTH in addition to the metabolic 7 in 3-4 weeks - PTH, Intact and Calcium  If all goes well follow-up 6 months

## 2014-10-16 ENCOUNTER — Ambulatory Visit (HOSPITAL_COMMUNITY)
Admission: RE | Admit: 2014-10-16 | Discharge: 2014-10-16 | Disposition: A | Discharge status: Home / Self Care | Payer: Medicare Other | Source: Ambulatory Visit | Attending: Family Medicine | Admitting: Family Medicine

## 2014-10-16 DIAGNOSIS — E119 Type 2 diabetes mellitus without complications: Secondary | ICD-10-CM | POA: Diagnosis not present

## 2014-10-16 DIAGNOSIS — I6523 Occlusion and stenosis of bilateral carotid arteries: Secondary | ICD-10-CM | POA: Diagnosis not present

## 2014-10-16 DIAGNOSIS — I10 Essential (primary) hypertension: Secondary | ICD-10-CM | POA: Diagnosis not present

## 2014-10-16 DIAGNOSIS — I779 Disorder of arteries and arterioles, unspecified: Secondary | ICD-10-CM | POA: Diagnosis not present

## 2014-10-16 DIAGNOSIS — E785 Hyperlipidemia, unspecified: Secondary | ICD-10-CM | POA: Diagnosis not present

## 2014-11-08 DIAGNOSIS — H35373 Puckering of macula, bilateral: Secondary | ICD-10-CM | POA: Diagnosis not present

## 2014-11-08 DIAGNOSIS — H2513 Age-related nuclear cataract, bilateral: Secondary | ICD-10-CM | POA: Diagnosis not present

## 2014-11-08 DIAGNOSIS — H3531 Nonexudative age-related macular degeneration: Secondary | ICD-10-CM | POA: Diagnosis not present

## 2014-11-19 DIAGNOSIS — R7309 Other abnormal glucose: Secondary | ICD-10-CM | POA: Diagnosis not present

## 2014-11-19 DIAGNOSIS — I10 Essential (primary) hypertension: Secondary | ICD-10-CM | POA: Diagnosis not present

## 2014-11-20 LAB — BASIC METABOLIC PANEL
BUN/Creatinine Ratio: 23 (ref 11–26)
BUN: 20 mg/dL (ref 8–27)
CALCIUM: 11.8 mg/dL — AB (ref 8.7–10.3)
CO2: 29 mmol/L (ref 18–29)
Chloride: 96 mmol/L — ABNORMAL LOW (ref 97–108)
Creatinine, Ser: 0.87 mg/dL (ref 0.57–1.00)
GFR calc non Af Amer: 61 mL/min/{1.73_m2} (ref >59)
GFR, EST AFRICAN AMERICAN: 71 mL/min/{1.73_m2} (ref >59)
Glucose: 113 mg/dL — ABNORMAL HIGH (ref 65–99)
Potassium: 4.8 mmol/L (ref 3.5–5.2)
Sodium: 140 mmol/L (ref 134–144)

## 2014-11-20 LAB — PTH, INTACT AND CALCIUM: PTH: 23 pg/mL (ref 15–65)

## 2014-11-22 ENCOUNTER — Ambulatory Visit (INDEPENDENT_AMBULATORY_CARE_PROVIDER_SITE_OTHER): Payer: Medicare Other | Admitting: Family Medicine

## 2014-11-22 ENCOUNTER — Encounter: Payer: Self-pay | Admitting: Family Medicine

## 2014-11-22 VITALS — Temp 98.4°F | Ht 62.5 in | Wt 151.4 lb

## 2014-11-22 DIAGNOSIS — J019 Acute sinusitis, unspecified: Secondary | ICD-10-CM

## 2014-11-22 DIAGNOSIS — B9689 Other specified bacterial agents as the cause of diseases classified elsewhere: Secondary | ICD-10-CM

## 2014-11-22 MED ORDER — CEFPROZIL 500 MG PO TABS: 500.0000 mg | ORAL_TABLET | Freq: Two times a day (BID) | ORAL | Status: DC

## 2014-11-22 MED ORDER — VALACYCLOVIR HCL 1 G PO TABS: ORAL_TABLET | ORAL | Status: DC

## 2014-11-22 NOTE — Progress Notes (Signed)
   Subjective:    Patient ID: [name], female    DOB: [birth year], 81 y.o.   MRN: [id number]  Cough The current episode started in the past 7 days. Associated symptoms include nasal congestion and rhinorrhea. Pertinent negatives include no chest pain, ear pain, fever, shortness of breath or wheezing. She has tried rest for the symptoms.   Patient states he started off with runny no sore throat drainage but then progressed and sinus pressure pain discomfort along with chest congestion she denies shortness of breath high fever chills or sweats PMH she has had history of pneumonia   Review of Systems  Constitutional: Negative for fever and activity change.  HENT: Positive for congestion and rhinorrhea. Negative for ear pain.   Eyes: Negative for discharge.  Respiratory: Positive for cough. Negative for shortness of breath and wheezing.   Cardiovascular: Negative for chest pain.       Objective:   Physical Exam  Constitutional: She appears well-developed.  HENT:  Head: Normocephalic.  Nose: Nose normal.  Mouth/Throat: Oropharynx is clear and moist. No oropharyngeal exudate.  Neck: Neck supple.  Cardiovascular: Normal rate and normal heart sounds.   No murmur heard. Pulmonary/Chest: Effort normal and breath sounds normal. She has no wheezes.  Lymphadenopathy:    She has no cervical adenopathy.  Skin: Skin is warm and dry.  Nursing note and vitals reviewed.   I don't find any evidence of pneumonia I would not recommend chest x-ray      Assessment & Plan:  Viral syndrome that led into a sinus infection as well as some bronchitis antibiotics prescribed warning signs discussed no x-ray or lab work indicated currently

## 2014-11-25 NOTE — Addendum Note (Signed)
Addended by: Dairl Ponder on: 11/25/2014 09:22 AM   Modules accepted: Orders

## 2014-11-26 ENCOUNTER — Ambulatory Visit (INDEPENDENT_AMBULATORY_CARE_PROVIDER_SITE_OTHER): Payer: Medicare Other | Admitting: Family Medicine

## 2014-11-26 VITALS — BP 128/72 | Temp 97.9°F | Ht 62.5 in | Wt 150.5 lb

## 2014-11-26 DIAGNOSIS — J019 Acute sinusitis, unspecified: Secondary | ICD-10-CM | POA: Diagnosis not present

## 2014-11-26 DIAGNOSIS — B9689 Other specified bacterial agents as the cause of diseases classified elsewhere: Secondary | ICD-10-CM

## 2014-11-26 MED ORDER — AMOXICILLIN-POT CLAVULANATE 875-125 MG PO TABS: 1.0000 | ORAL_TABLET | Freq: Two times a day (BID) | ORAL | Status: DC

## 2014-11-26 MED ORDER — CEFTRIAXONE SODIUM 500 MG IJ SOLR
500.0000 mg | Freq: Once | INTRAMUSCULAR | Status: AC
  Administered 2014-11-26: 500 mg via INTRAMUSCULAR

## 2014-11-26 NOTE — Progress Notes (Signed)
   Subjective:    Patient ID: [name], female    DOB: 23-Jan-1929, 81 y.o.   MRN: [id number]  Cough This is a new problem. The current episode started 1 to 4 weeks ago. The problem has been unchanged. The problem occurs every few minutes. The cough is productive of sputum. Associated symptoms include rhinorrhea. Pertinent negatives include no chest pain, ear pain, fever, shortness of breath or wheezing. The symptoms are aggravated by lying down. Treatments tried: Cefzil  The treatment provided no relief.  please see previous note. She was put on sepsis she states it has not helped she is now starting have some very discolored phlegm she denies any high fever chills  Patient states no other concerns this visit.  Review of Systems  Constitutional: Negative for fever and activity change.  HENT: Positive for congestion and rhinorrhea. Negative for ear pain.   Eyes: Negative for discharge.  Respiratory: Positive for cough. Negative for shortness of breath and wheezing.   Cardiovascular: Negative for chest pain.       Objective:   Physical Exam  Constitutional: She appears well-developed.  HENT:  Head: Normocephalic.  Nose: Nose normal.  Mouth/Throat: Oropharynx is clear and moist. No oropharyngeal exudate.  Neck: Neck supple.  Cardiovascular: Normal rate and normal heart sounds.   No murmur heard. Pulmonary/Chest: Effort normal and breath sounds normal. She has no wheezes.  Lymphadenopathy:    She has no cervical adenopathy.  Skin: Skin is warm and dry.  Nursing note and vitals reviewed.    It should be noted that the patient did do her additional labs hopefully be back by this weekend then we can work further at figuring out her elevated calcium. She is taking several supplements that have vitamin D in it await the results     Assessment & Plan:  Upper respiratory illness with secondary sinusitis-I don't feel the patient needs get x-rays currently. Her lungs sound great.  I would recommend a shot of antibiotics and change in antibiotics if she is not dramatically better by early next week she needs let us know if she starts having progressive wheezing difficulty breathing or worse she needs to follow-up sooner.

## 2014-11-28 ENCOUNTER — Telehealth: Payer: Self-pay | Admitting: Family Medicine

## 2014-11-28 MED ORDER — CEPHALEXIN 500 MG PO CAPS: 500.0000 mg | ORAL_CAPSULE | Freq: Three times a day (TID) | ORAL | Status: DC

## 2014-11-28 NOTE — Telephone Encounter (Signed)
Seen on 11/26/14 given Augmentin 875 mg BID x 10 days

## 2014-11-28 NOTE — Telephone Encounter (Signed)
Keflex 500 tid ten d 

## 2014-11-28 NOTE — Telephone Encounter (Signed)
Pt was seen the other day and the medicine she was given is making her nauseated. Pt wants to know if something else can be called in.   [employer]

## 2014-11-28 NOTE — Telephone Encounter (Signed)
Med sent to pharmacy. Patient was notified.  

## 2014-12-01 LAB — VITAMIN D 1,25 DIHYDROXY
Vitamin D 1, 25 (OH)2 Total: 70 pg/mL
Vitamin D2 1, 25 (OH)2: <10 pg/mL
Vitamin D3 1, 25 (OH)2: 70 pg/mL

## 2014-12-01 LAB — PTH-RELATED PEPTIDE: PTH-related peptide: <1.1 pmol/L

## 2014-12-01 LAB — ALBUMIN: ALBUMIN: 4.3 g/dL (ref 3.5–4.7)

## 2014-12-01 LAB — VITAMIN D 25 HYDROXY (VIT D DEFICIENCY, FRACTURES): Vit D, 25-Hydroxy: 51.7 ng/mL (ref 30.0–100.0)

## 2014-12-01 LAB — CALCIUM: Calcium: 10.3 mg/dL (ref 8.7–10.3)

## 2014-12-10 ENCOUNTER — Encounter: Payer: Self-pay | Admitting: Family Medicine

## 2014-12-10 ENCOUNTER — Ambulatory Visit (INDEPENDENT_AMBULATORY_CARE_PROVIDER_SITE_OTHER): Payer: Medicare Other | Admitting: Family Medicine

## 2014-12-10 VITALS — BP 130/70 | Temp 98.2°F | Ht 62.5 in | Wt 153.1 lb

## 2014-12-10 DIAGNOSIS — B9689 Other specified bacterial agents as the cause of diseases classified elsewhere: Secondary | ICD-10-CM

## 2014-12-10 DIAGNOSIS — J019 Acute sinusitis, unspecified: Secondary | ICD-10-CM | POA: Diagnosis not present

## 2014-12-10 NOTE — Progress Notes (Signed)
   Subjective:    Patient ID: [name], female    DOB: April 20, 1928, 81 y.o.   MRN: [id number]  Cough This is a recurrent problem. The problem occurs every few minutes. The cough is productive of sputum (Thick yellow tinged sputum). Pertinent negatives include no chest pain, ear pain, fever, rhinorrhea, shortness of breath or wheezing.   Patient states no other concerns this visit. Patient was initially started on antibiotics switched over to a different antibiotics had excessive coughing over the weekend no wheezing or difficulty breathing but did have frequent coughing on later this morning but now she states she's starting to turn corner Review of Systems  Constitutional: Negative for fever and activity change.  HENT: Negative for congestion, ear pain and rhinorrhea.   Eyes: Negative for discharge.  Respiratory: Positive for cough. Negative for shortness of breath and wheezing.   Cardiovascular: Negative for chest pain.  some sweats no fever Appetite good      Objective:   Physical Exam  Constitutional: She appears well-developed.  HENT:  Head: Normocephalic.  Nose: Nose normal.  Mouth/Throat: Oropharynx is clear and moist. No oropharyngeal exudate.  Neck: Neck supple.  Cardiovascular: Normal rate and normal heart sounds.   No murmur heard. Pulmonary/Chest: Effort normal and breath sounds normal. She has no wheezes.  Lymphadenopathy:    She has no cervical adenopathy.  Skin: Skin is warm and dry.  Nursing note and vitals reviewed.         Assessment & Plan:  Recent viral illness with secondary sinusitis antibiotics have been prescribed patient finishing these actually she is doing better. She states she felt she turn the corner this morning. I don't recommend chest x-ray lab work at this time she is to follow-up if progressive troubles.

## 2014-12-18 ENCOUNTER — Ambulatory Visit: Payer: Medicare Other

## 2014-12-24 ENCOUNTER — Institutional Professional Consult (permissible substitution): Payer: Medicare Other | Admitting: Internal Medicine

## 2014-12-25 ENCOUNTER — Ambulatory Visit (INDEPENDENT_AMBULATORY_CARE_PROVIDER_SITE_OTHER): Payer: Medicare Other | Admitting: Nurse Practitioner

## 2014-12-25 ENCOUNTER — Ambulatory Visit (HOSPITAL_COMMUNITY)
Admission: RE | Admit: 2014-12-25 | Discharge: 2014-12-25 | Disposition: A | Discharge status: Home / Self Care | Payer: Medicare Other | Source: Ambulatory Visit | Attending: Nurse Practitioner | Admitting: Nurse Practitioner

## 2014-12-25 ENCOUNTER — Encounter: Payer: Self-pay | Admitting: Nurse Practitioner

## 2014-12-25 VITALS — BP 138/94 | Temp 98.6°F | Ht 64.5 in | Wt 151.0 lb

## 2014-12-25 DIAGNOSIS — J449 Chronic obstructive pulmonary disease, unspecified: Secondary | ICD-10-CM | POA: Insufficient documentation

## 2014-12-25 DIAGNOSIS — R509 Fever, unspecified: Secondary | ICD-10-CM | POA: Diagnosis not present

## 2014-12-25 DIAGNOSIS — R0602 Shortness of breath: Secondary | ICD-10-CM | POA: Diagnosis not present

## 2014-12-25 DIAGNOSIS — Z87891 Personal history of nicotine dependence: Secondary | ICD-10-CM | POA: Insufficient documentation

## 2014-12-25 DIAGNOSIS — J329 Chronic sinusitis, unspecified: Secondary | ICD-10-CM

## 2014-12-25 DIAGNOSIS — R05 Cough: Secondary | ICD-10-CM

## 2014-12-25 DIAGNOSIS — R059 Cough, unspecified: Secondary | ICD-10-CM

## 2014-12-25 MED ORDER — AZITHROMYCIN 250 MG PO TABS: ORAL_TABLET | ORAL | Status: DC

## 2014-12-25 MED ORDER — METHYLPREDNISOLONE ACETATE 40 MG/ML IJ SUSP
40.0000 mg | Freq: Once | INTRAMUSCULAR | Status: AC
  Administered 2014-12-25: 40 mg via INTRAMUSCULAR

## 2014-12-25 MED ORDER — BENZONATATE 100 MG PO CAPS: 100.0000 mg | ORAL_CAPSULE | Freq: Three times a day (TID) | ORAL | Status: DC | PRN

## 2014-12-25 NOTE — Patient Instructions (Signed)
Mucinex DM as directed Perles for cough Shot of steroids; call Friday if no better

## 2014-12-25 NOTE — Progress Notes (Signed)
Subjective:  Presents for complaints of cough that began on 8/29. Has taken 3 courses of antibiotics with minimal difference. Coughing day and night. Worse with lying down or prolonged talking. No fever. Producing hard mucus green to white color. Has not tried any OTC meds. Describes a rattle in the upper anterior chest, no wheezing or shortness of breath. No nausea vomiting acid reflux or abdominal pain. No sore throat headache or ear pain. Has a history of chronic rhinitis. Has seen allergy specialist at [facility]. Currently on her Flonase. Has taken his steroid taper in the past which has helped. Had a brief period of smoking many years ago.  Objective:   BP 138/94 mmHg  Temp(Src) 98.6 F (37 C) (Oral)  Ht 5' 4.5" (1.638 m)  Wt 151 lb (68.493 kg)  BMI 25.53 kg/m2 NAD. Alert, oriented. Left TM obscured with cerumen. Right TM mild cerumen, clear effusion. Pharynx injected with green PND noted. Neck supple with mild soft anterior adenopathy. Lungs faint expiratory rhonchi noted upper anterior lobes otherwise clear. No wheezing or tachypnea. Heart regular rate rhythm. Abdomen soft nontender.  Assessment: Rhinosinusitis - Plan: methylPREDNISolone acetate (DEPO-MEDROL) injection 40 mg  Cough - Plan: DG Chest 2 View  Plan:  Meds ordered this encounter  Medications  . benzonatate (TESSALON) 100 MG capsule    Sig: Take 1 capsule (100 mg total) by mouth 3 (three) times daily as needed for cough.    Dispense:  30 capsule    Refill:  0    Order Specific Question:  Supervising Provider    Answer:  Mikey Kirschner [[id number]]  . azithromycin (ZITHROMAX Z-PAK) 250 MG tablet    Sig: Take 2 tablets (500 mg) on  Day 1,  followed by 1 tablet (250 mg) once daily on Days 2 through 5.    Dispense:  6 each    Refill:  0    Order Specific Question:  Supervising Provider    Answer:  Mikey Kirschner [[id number]]  . methylPREDNISolone acetate (DEPO-MEDROL) injection 40 mg    Sig:    Mucinex DM as directed for cough.  Continue Flonase as directed. Chest x-ray pending. Patient to call back in 48 hours if no improvement in symptoms, will try a oral steroid taper. If cough persists in the next couple of weeks, consider switching lisinopril.

## 2015-01-01 ENCOUNTER — Telehealth: Payer: Self-pay | Admitting: Family Medicine

## 2015-01-01 NOTE — Telephone Encounter (Signed)
There was increased air in the lungs which can be a sign of COPD but no other problems. Has the cough changed? Any fever? Wheezing?

## 2015-01-01 NOTE — Telephone Encounter (Signed)
Spoke with patient and patient stated that cough has resolved, no fever, and no wheezing. Discuss with Pearson Forster, NP in real time. Hoyle Sauer stated that it is ok for patient to stop Tessalon because they were only for as needed purposes.

## 2015-01-01 NOTE — Telephone Encounter (Signed)
Patient wants to know results of x-ray and she wants to know is she should continue taking the tessalon 100 mg was seen by Hoyle Sauer on 10/12.She states feels much better not coughing.

## 2015-01-02 ENCOUNTER — Ambulatory Visit (INDEPENDENT_AMBULATORY_CARE_PROVIDER_SITE_OTHER): Payer: Medicare Other

## 2015-01-02 DIAGNOSIS — Z23 Encounter for immunization: Secondary | ICD-10-CM

## 2015-01-23 ENCOUNTER — Encounter: Payer: Self-pay | Admitting: Pulmonary Disease

## 2015-01-23 ENCOUNTER — Ambulatory Visit (INDEPENDENT_AMBULATORY_CARE_PROVIDER_SITE_OTHER): Payer: Medicare Other | Admitting: Pulmonary Disease

## 2015-01-23 VITALS — BP 142/70 | HR 68 | Ht 64.0 in | Wt 156.0 lb

## 2015-01-23 DIAGNOSIS — R05 Cough: Secondary | ICD-10-CM

## 2015-01-23 DIAGNOSIS — R059 Cough, unspecified: Secondary | ICD-10-CM

## 2015-01-23 DIAGNOSIS — R9389 Abnormal findings on diagnostic imaging of other specified body structures: Secondary | ICD-10-CM | POA: Insufficient documentation

## 2015-01-23 NOTE — Addendum Note (Signed)
Addended by: Len Blalock on: 01/23/2015 12:18 PM   Modules accepted: Orders

## 2015-01-23 NOTE — Assessment & Plan Note (Signed)
[name] has a cough in the setting of what sounds like lower respiratory infections this year. She had a lengthy smoking history and her chest x-ray was interpreted as having emphysema. However, she tells me that she was never diagnosed with COPD when she was followed by [facility] many years ago. She has had a history of pulmonary nodules which were stable as of 2012.  Fortunately, the cough has resolved. I am a bit concerned at the duration of the cough this year. I explained to her that lisinopril can certainly contribute to that even if she has been taking it for years. All the mucus production however does make me question whether or not there is some degree of chronic bronchitis which may have developed because of her prior tobacco use. Also, I agree with her primary care physician the postnasal drip can lead to cough with mucus production.  It seems as if she has mild pleural calcification on her chest x-ray. We will follow this with repeated chest x-rays alone because this is likely a chronic, benign finding. I will try to track down the results from prior CT chest from [facility].  Plan: Lung function testing today to look for evidence of COPD If mucus production and cough returns then I would like to sample mucus for bacteria, fungal, AFB Plan repeat chest x-ray with the next visit to follow the mild pleural calcification I saw on today's study

## 2015-01-23 NOTE — Progress Notes (Signed)
Subjective:    Patient ID: [name], female    DOB: June 14, 1928, 81 y.o.   MRN: [id number]  HPI Chief Complaint  Patient presents with  . Advice Only    Self referral for prod cough on and off X9 mos.     [name] is here to see me today for a cough which was recurring in 2016. She tells me that she was diagnosed with pneumonia in March or February of this year and she had severe mucus production, shortness of breath and malaise. This resolved within she started to have a cough again in August which is again productive of white mucus production. She was treated with 3 separate antibiotics as well as multiple courses of steroids. Finally, when she went back approximately one week ago she was given a steroid shot as well as a prednisone taper. She says now the cough has completely resolved. She denies shortness of breath other than very mild dyspnea on exertion. She remains quite active taking care of her house and her yard by herself. She says she is not limited by dyspnea with that.  She previously smoked 2 packs of cigarettes daily and quit after 50 years of use. She quit in 1990. She used to follow with pulmonary but she says she was never told that she had emphysema or COPD. I have personally reviewed the records from [facility] and it looks as if she was followed for pulmonary nodules which were stable after 3 years of CT monitoring (as of 2012) and some allergic rhinitis. She says that her primary care physician thinks that her cough this year may have been related to her allergic rhinitis. She says that she is unaware of significant rhinitis symptoms however.   Past Medical History  Diagnosis Date  . Hypertension   . Hypercholesteremia   . Colon polyps   . PONV (postoperative nausea and vomiting)   . Osteoporosis   . Osteoarthritis   . Lung nodules      Family History  Problem Relation Age of Onset  . Colon cancer Father      Social History   Social History  . Marital  Status: Widowed    Spouse Name: N/A  . Number of Children: N/A  . Years of Education: N/A   Occupational History  . Not on file.   Social History Main Topics  . Smoking status: Former Smoker -- 2.00 packs/day for 50 years    Types: Cigarettes    Quit date: 03/15/1988  . Smokeless tobacco: Never Used  . Alcohol Use: No  . Drug Use: No  . Sexual Activity: Not on file   Other Topics Concern  . Not on file   Social History Narrative     Allergies  Allergen Reactions  . Codeine Nausea And Vomiting  . Morphine Nausea And Vomiting  . Neomycin-Bacitracin Zn-Polymyx Itching and Rash     Outpatient Prescriptions Prior to Visit  Medication Sig Dispense Refill  . Bilberry, Vaccinium myrtillus, (BILBERRY PO) Take by mouth.    . furosemide (LASIX) 20 MG tablet TAKE (1) TABLET BY MOUTH EACH MORNING. 90 tablet 1  . lisinopril (PRINIVIL,ZESTRIL) 20 MG tablet Take 1 tablet (20 mg total) by mouth daily. 90 tablet 1  . nortriptyline (PAMELOR) 10 MG capsule Take 10 mg by mouth. Take 2 at night    . polyethylene glycol (MIRALAX / GLYCOLAX) packet Take 17 g by mouth daily as needed. Constipation    . azithromycin (ZITHROMAX Z-PAK) 250  MG tablet Take 2 tablets (500 mg) on  Day 1,  followed by 1 tablet (250 mg) once daily on Days 2 through 5. (Patient not taking: Reported on 01/23/2015) 6 each 0  . benzonatate (TESSALON) 100 MG capsule Take 1 capsule (100 mg total) by mouth 3 (three) times daily as needed for cough. (Patient not taking: Reported on 01/23/2015) 30 capsule 0  . Bioflavonoid Products (ESTER C PO) Take by mouth daily.    Marland Kitchen OVER THE COUNTER MEDICATION Osteo bi flex one every day    . OVER THE COUNTER MEDICATION oscal take 2 every day    . valACYclovir (VALTREX) 1000 MG tablet 2 po BID 12 hours apart prn fever blisters (2 doses total per episode) (Patient not taking: Reported on 01/23/2015) 20 tablet 4  . ZOVIRAX 5 % APPLY TO AFFECTD AREA EVERY 3 HOURS AS DIRECTED. (Patient not taking:  Reported on 01/23/2015) 5 g 6   No facility-administered medications prior to visit.       Review of Systems  Constitutional: Negative for fever and unexpected weight change.  HENT: Positive for congestion. Negative for dental problem, ear pain, nosebleeds, postnasal drip, rhinorrhea, sinus pressure, sneezing, sore throat and trouble swallowing.   Eyes: Negative for redness and itching.  Respiratory: Positive for cough and shortness of breath. Negative for chest tightness and wheezing.   Cardiovascular: Negative for palpitations and leg swelling.  Gastrointestinal: Negative for nausea and vomiting.  Genitourinary: Negative for dysuria.  Musculoskeletal: Negative for joint swelling.  Skin: Negative for rash.  Neurological: Negative for headaches.  Hematological: Does not bruise/bleed easily.  Psychiatric/Behavioral: Negative for dysphoric mood. The patient is not nervous/anxious.        Objective:   Physical Exam Filed Vitals:   01/23/15 1053  BP: 142/70  Pulse: 68  Height: 5\' 4"  (1.626 m)  Weight: 156 lb (70.761 kg)  SpO2: 100%   Gen: well appearing, no acute distress HENT: NCAT, OP clear, neck supple without masses Eyes: PERRL, EOMi Lymph: no cervical lymphadenopathy PULM: CTA B CV: RRR, no mgr, no JVD GI: BS+, soft, nontender, no hsm Derm: no rash or skin breakdown MSK: normal bulk and tone Neuro: A&Ox4, CN II-XII intact, strength 5/5 in all 4 extremities Psyche: normal mood and affect   October 2016 chest x-ray images personally reviewed showing mild hyperinflation and questionable anterior lung calcification, no posterior sternal emphysema noted Records from [facility] pulmonary clinic were reviewed were she was followed for pulmonary nodules and allergic rhinitis     Assessment & Plan:  Cough [name] has a cough in the setting of what sounds like lower respiratory infections this year. She had a lengthy smoking history and her chest x-ray was interpreted as having  emphysema. However, she tells me that she was never diagnosed with COPD when she was followed by [facility] many years ago. She has had a history of pulmonary nodules which were stable as of 2012.  Fortunately, the cough has resolved. I am a bit concerned at the duration of the cough this year. I explained to her that lisinopril can certainly contribute to that even if she has been taking it for years. All the mucus production however does make me question whether or not there is some degree of chronic bronchitis which may have developed because of her prior tobacco use. Also, I agree with her primary care physician the postnasal drip can lead to cough with mucus production.  It seems as if she has mild pleural  calcification on her chest x-ray. We will follow this with repeated chest x-rays alone because this is likely a chronic, benign finding. I will try to track down the results from prior CT chest from [facility].  Plan: Lung function testing today to look for evidence of COPD If mucus production and cough returns then I would like to sample mucus for bacteria, fungal, AFB Plan repeat chest x-ray with the next visit to follow the mild pleural calcification I saw on today's study     Current outpatient prescriptions:  .  Bilberry, Vaccinium myrtillus, (BILBERRY PO), Take by mouth., Disp: , Rfl:  .  furosemide (LASIX) 20 MG tablet, TAKE (1) TABLET BY MOUTH EACH MORNING., Disp: 90 tablet, Rfl: 1 .  lisinopril (PRINIVIL,ZESTRIL) 20 MG tablet, Take 1 tablet (20 mg total) by mouth daily., Disp: 90 tablet, Rfl: 1 .  nortriptyline (PAMELOR) 10 MG capsule, Take 10 mg by mouth. Take 2 at night, Disp: , Rfl:  .  polyethylene glycol (MIRALAX / GLYCOLAX) packet, Take 17 g by mouth daily as needed. Constipation, Disp: , Rfl:

## 2015-01-23 NOTE — Patient Instructions (Addendum)
Let me know if the cough returns If he start producing mucus again and call our office and we can order tests on the mucous I will plan on seeing you back in 2 months or sooner if needed, we will get a chest x-ray at that time to follow-up

## 2015-01-28 NOTE — Progress Notes (Signed)
Quick Note:  Contacted pt with results per BQ Pt expressed understanding, nothing further needed ______

## 2015-02-17 ENCOUNTER — Other Ambulatory Visit: Payer: Self-pay | Admitting: Family Medicine

## 2015-02-24 DIAGNOSIS — G629 Polyneuropathy, unspecified: Secondary | ICD-10-CM | POA: Diagnosis not present

## 2015-03-21 ENCOUNTER — Ambulatory Visit (INDEPENDENT_AMBULATORY_CARE_PROVIDER_SITE_OTHER): Payer: Medicare Other | Admitting: Pulmonary Disease

## 2015-03-21 ENCOUNTER — Encounter: Payer: Self-pay | Admitting: Pulmonary Disease

## 2015-03-21 VITALS — BP 124/62 | HR 78 | Temp 98.0°F | Ht 64.0 in | Wt 159.0 lb

## 2015-03-21 DIAGNOSIS — IMO0001 Reserved for inherently not codable concepts without codable children: Secondary | ICD-10-CM

## 2015-03-21 DIAGNOSIS — J440 Chronic obstructive pulmonary disease with acute lower respiratory infection: Secondary | ICD-10-CM

## 2015-03-21 DIAGNOSIS — J449 Chronic obstructive pulmonary disease, unspecified: Secondary | ICD-10-CM | POA: Diagnosis not present

## 2015-03-21 DIAGNOSIS — J209 Acute bronchitis, unspecified: Secondary | ICD-10-CM | POA: Insufficient documentation

## 2015-03-21 MED ORDER — TIOTROPIUM BROMIDE MONOHYDRATE 1.25 MCG/ACT IN AERS: 2.0000 | INHALATION_SPRAY | Freq: Every day | RESPIRATORY_TRACT | Status: DC

## 2015-03-21 MED ORDER — DOXYCYCLINE HYCLATE 100 MG PO TABS: 100.0000 mg | ORAL_TABLET | Freq: Two times a day (BID) | ORAL | Status: DC

## 2015-03-21 NOTE — Progress Notes (Signed)
Subjective:    Patient ID: [name], female    DOB: 04/25/1928, 82 y.o.   MRN: [id number]  Synopsis: Referred in 2016 for chronic cough. Cough had resolved by the time she saw me. She been followed by [facility] for chronic cough for many years prior felt to be related to sinus congestion. She also had pulmonary nodules and some pleural calcification which was stable on chest imaging through 2012 there. After I saw her we did simple spirometry testing which showed clear airflow obstruction with an FEV1 of 48% predicted. However, the FVC was low at 1.38 L (54% predicted).  She smoked 2 packs a day for many years.   HPI Chief Complaint  Patient presents with  . Acute Visit    pt c/o chest congestion, sinus congestion, prod cough with yellow mucus sincde 03/11/15.     [name] has been coughing more in the last several days.  She is producing mucus typicall in he morning and evenings that is yellow.  She is taking mucinex.  She has some dyspnea, mild intensity.  Some wheezing.  She had a fever the first day, but no chills.  She has been eating fruit and drinking fruit juice.    Past Medical History  Diagnosis Date  . Hypertension   . Hypercholesteremia   . Colon polyps   . PONV (postoperative nausea and vomiting)   . Osteoporosis   . Osteoarthritis   . Lung nodules       Review of Systems     Objective:   Physical Exam Filed Vitals:   03/21/15 1448  BP: 124/62  Pulse: 78  Temp: 98 F (36.7 C)  TempSrc: Oral  Height: 5\' 4"  (1.626 m)  Weight: 159 lb (72.122 kg)  SpO2: 95%    RA  Gen: well appearing HENT: OP clear, TM's clear, neck supple PULM: Mild wheeze LUL, normal percussion CV: RRR, slight systolic murmur, trace edema GI: BS+, soft, nontender Derm: no cyanosis or rash Psyche: normal mood and affect  Spirometry results reviewed today, see above Chest x-ray images from her last visit reviewed today showing clear emphysema.     Assessment & Plan:    COPD with bronchitis In November on simple spirometry testing we found severe airflow obstruction. It should be noted that this was in the setting of what may be restrictive lung disease as her FVC was low. So it sometimes difficult to interpret the severity of airflow obstruction with concomitant restriction, so at one point she may need to have lung function testing with lung volumes and DLCO to help sort all this out.  Regardless, it seems that she has a chronic bronchitis phenotype and is currently having an acute exacerbation. Because she has multiple exacerbations throughout the course of the year I think she would benefit from adding Spiriva daily.  Plan: Take doxycycline for 5 days At [facility] Sample mucus for AFB, bacterial, fungal culture F/U as previously scheudled     Current outpatient prescriptions:  .  Bilberry, Vaccinium myrtillus, (BILBERRY PO), Take by mouth., Disp: , Rfl:  .  dextromethorphan-guaiFENesin (MUCINEX DM) 30-600 MG 12hr tablet, Take 1 tablet by mouth 2 (two) times daily., Disp: , Rfl:  .  fluticasone (FLONASE) 50 MCG/ACT nasal spray, Place 2 sprays into both nostrils daily. Sometimes uses a second dose qhs, Disp: , Rfl:  .  furosemide (LASIX) 20 MG tablet, TAKE (1) TABLET BY MOUTH EACH MORNING., Disp: 90 tablet, Rfl: 0 .  lisinopril (PRINIVIL,ZESTRIL)  20 MG tablet, TAKE ONE TABLET BY MOUTH ONCE DAILY., Disp: 90 tablet, Rfl: 0 .  nortriptyline (PAMELOR) 10 MG capsule, Take 10 mg by mouth. Take 2 at night, Disp: , Rfl:  .  polyethylene glycol (MIRALAX / GLYCOLAX) packet, Take 17 g by mouth daily as needed. Constipation, Disp: , Rfl:  .  doxycycline (VIBRA-TABS) 100 MG tablet, Take 1 tablet (100 mg total) by mouth 2 (two) times daily., Disp: 14 tablet, Rfl: 0 .  Tiotropium Bromide Monohydrate (SPIRIVA RESPIMAT) 1.25 MCG/ACT AERS, Inhale 2 puffs into the lungs daily., Disp: 4 g, Rfl: 5

## 2015-03-21 NOTE — Patient Instructions (Signed)
Please provide [place] with a sample of your mucus as soon as possible. Take doxycycline 100 mg twice a day for 5 days Start taking Spiriva 2 puffs every morning no matter how you feel. We will see you back as previously scheduled

## 2015-03-21 NOTE — Assessment & Plan Note (Signed)
In November on simple spirometry testing we found severe airflow obstruction. It should be noted that this was in the setting of what may be restrictive lung disease as her FVC was low. So it sometimes difficult to interpret the severity of airflow obstruction with concomitant restriction, so at one point she may need to have lung function testing with lung volumes and DLCO to help sort all this out.  Regardless, it seems that she has a chronic bronchitis phenotype and is currently having an acute exacerbation. Because she has multiple exacerbations throughout the course of the year I think she would benefit from adding Spiriva daily.  Plan: Take doxycycline for 5 days At [facility] Sample mucus for AFB, bacterial, fungal culture F/U as previously scheudled

## 2015-03-26 ENCOUNTER — Telehealth: Payer: Self-pay | Admitting: Family Medicine

## 2015-03-26 ENCOUNTER — Other Ambulatory Visit: Payer: Self-pay | Admitting: Family Medicine

## 2015-03-26 DIAGNOSIS — E785 Hyperlipidemia, unspecified: Secondary | ICD-10-CM

## 2015-03-26 DIAGNOSIS — I10 Essential (primary) hypertension: Secondary | ICD-10-CM

## 2015-03-26 DIAGNOSIS — R739 Hyperglycemia, unspecified: Secondary | ICD-10-CM

## 2015-03-26 DIAGNOSIS — Z1231 Encounter for screening mammogram for malignant neoplasm of breast: Secondary | ICD-10-CM

## 2015-03-26 DIAGNOSIS — Z79899 Other long term (current) drug therapy: Secondary | ICD-10-CM

## 2015-03-26 NOTE — Telephone Encounter (Signed)
Lipid, liver, metabolic 7, hemoglobin C1U

## 2015-03-26 NOTE — Telephone Encounter (Signed)
Pt has PE in feb would like bw please   Last labs 11/26/14 Albumin, Calcium, Vit D 25 hydroxy, Vit D1, 25 dihydroxy, PTH related Peptide  10/11/14 BMP, A1C, CBC, Hep, Lip

## 2015-03-26 NOTE — Telephone Encounter (Signed)
Called patient and informed her per Dr.Scott Luking- labs for upcoming have been ordered and patient needs to be fasting when labs are drawn. Patient verbalized understanding.

## 2015-03-27 ENCOUNTER — Other Ambulatory Visit: Payer: Medicare Other

## 2015-03-27 DIAGNOSIS — IMO0001 Reserved for inherently not codable concepts without codable children: Secondary | ICD-10-CM

## 2015-03-27 DIAGNOSIS — J449 Chronic obstructive pulmonary disease, unspecified: Secondary | ICD-10-CM | POA: Diagnosis not present

## 2015-04-02 ENCOUNTER — Ambulatory Visit (HOSPITAL_COMMUNITY)
Admission: RE | Admit: 2015-04-02 | Discharge: 2015-04-02 | Disposition: A | Discharge status: Home / Self Care | Payer: Medicare Other | Source: Ambulatory Visit | Attending: Family Medicine | Admitting: Family Medicine

## 2015-04-02 DIAGNOSIS — Z1231 Encounter for screening mammogram for malignant neoplasm of breast: Secondary | ICD-10-CM | POA: Diagnosis present

## 2015-04-04 ENCOUNTER — Other Ambulatory Visit: Payer: Self-pay | Admitting: Family Medicine

## 2015-04-04 DIAGNOSIS — R928 Other abnormal and inconclusive findings on diagnostic imaging of breast: Secondary | ICD-10-CM

## 2015-04-04 LAB — LOWER RESPIRATORY CULTURE

## 2015-04-08 DIAGNOSIS — I10 Essential (primary) hypertension: Secondary | ICD-10-CM | POA: Diagnosis not present

## 2015-04-08 DIAGNOSIS — R739 Hyperglycemia, unspecified: Secondary | ICD-10-CM | POA: Diagnosis not present

## 2015-04-08 DIAGNOSIS — E785 Hyperlipidemia, unspecified: Secondary | ICD-10-CM | POA: Diagnosis not present

## 2015-04-08 DIAGNOSIS — Z79899 Other long term (current) drug therapy: Secondary | ICD-10-CM | POA: Diagnosis not present

## 2015-04-09 LAB — HEPATIC FUNCTION PANEL
ALBUMIN: 4.5 g/dL (ref 3.5–4.7)
ALK PHOS: 68 IU/L (ref 39–117)
ALT: 22 IU/L (ref 0–32)
AST: 28 IU/L (ref 0–40)
BILIRUBIN TOTAL: 0.6 mg/dL (ref 0.0–1.2)
BILIRUBIN, DIRECT: 0.14 mg/dL (ref 0.00–0.40)
TOTAL PROTEIN: 6.8 g/dL (ref 6.0–8.5)

## 2015-04-09 LAB — BASIC METABOLIC PANEL
BUN / CREAT RATIO: 19 (ref 11–26)
BUN: 17 mg/dL (ref 8–27)
CO2: 26 mmol/L (ref 18–29)
CREATININE: 0.91 mg/dL (ref 0.57–1.00)
Calcium: 10.3 mg/dL (ref 8.7–10.3)
Chloride: 98 mmol/L (ref 96–106)
GFR, EST AFRICAN AMERICAN: 67 mL/min/{1.73_m2} (ref >59)
GFR, EST NON AFRICAN AMERICAN: 58 mL/min/{1.73_m2} — AB (ref >59)
Glucose: 98 mg/dL (ref 65–99)
POTASSIUM: 4.6 mmol/L (ref 3.5–5.2)
SODIUM: 139 mmol/L (ref 134–144)

## 2015-04-09 LAB — LIPID PANEL
CHOLESTEROL TOTAL: 243 mg/dL — AB (ref 100–199)
Chol/HDL Ratio: 2.4 ratio units (ref 0.0–4.4)
HDL: 103 mg/dL (ref >39)
LDL Calculated: 120 mg/dL — ABNORMAL HIGH (ref 0–99)
Triglycerides: 98 mg/dL (ref 0–149)
VLDL CHOLESTEROL CAL: 20 mg/dL (ref 5–40)

## 2015-04-09 LAB — HEMOGLOBIN A1C
Est. average glucose Bld gHb Est-mCnc: 131 mg/dL
Hgb A1c MFr Bld: 6.2 % — ABNORMAL HIGH (ref 4.8–5.6)

## 2015-04-11 ENCOUNTER — Encounter: Payer: Self-pay | Admitting: Pulmonary Disease

## 2015-04-11 ENCOUNTER — Ambulatory Visit (INDEPENDENT_AMBULATORY_CARE_PROVIDER_SITE_OTHER): Payer: Medicare Other | Admitting: Pulmonary Disease

## 2015-04-11 VITALS — BP 126/66 | HR 94 | Ht 64.0 in | Wt 160.0 lb

## 2015-04-11 DIAGNOSIS — J449 Chronic obstructive pulmonary disease, unspecified: Secondary | ICD-10-CM | POA: Diagnosis not present

## 2015-04-11 DIAGNOSIS — IMO0001 Reserved for inherently not codable concepts without codable children: Secondary | ICD-10-CM

## 2015-04-11 NOTE — Patient Instructions (Signed)
Use Spiriva 2 puffs daily We will see you back in 6 months or sooner if needed

## 2015-04-11 NOTE — Assessment & Plan Note (Signed)
This has been a stable interval for [name]. She is tolerating her Spiriva well without difficulty.  Plan: Continue Spiriva daliy She was educated on the importance of exercise, immunizations (which are up to date), and hand washing F/u 6 months

## 2015-04-11 NOTE — Progress Notes (Signed)
   Subjective:    Patient ID: [name], female    DOB: [birth year], 82 y.o.   MRN: [id number]  Synopsis: Referred in 2016 for chronic cough. Cough had resolved by the time she saw me. She been followed by [facility] for chronic cough for many years prior felt to be related to sinus congestion. She also had pulmonary nodules and some pleural calcification which was stable on chest imaging through 2012 there. After I saw her we did simple spirometry testing which showed clear airflow obstruction with an FEV1 of 48% predicted. However, the FVC was low at 1.38 L (54% predicted).  She smoked 2 packs a day for many years.   HPI Chief Complaint  Patient presents with  . Follow-up    pt feeling better since last ov-cough still present, yet nonproductive.    [name] is feeling much better  She has not had more chest congestion since taking the medicine.  She had some GI intolerance fro the doxycycline. She says that the Spiriva is helping with her syptoms some as well.  She had some trouble with it at first with the administration. She started using some saline sprays recently.   Past Medical History  Diagnosis Date  . Hypertension   . Hypercholesteremia   . Colon polyps   . PONV (postoperative nausea and vomiting)   . Osteoporosis   . Osteoarthritis   . Lung nodules       Review of Systems     Objective:   Physical Exam Filed Vitals:   04/11/15 1400  BP: 126/66  Pulse: 94  Height: 5\' 4"  (1.626 m)  Weight: 160 lb (72.576 kg)  SpO2: 97%    RA  Gen: well appearing HENT: OP clear, TM's clear, neck supple PULM: Mild wheeze LUL, normal percussion CV: RRR, slight systolic murmur, trace edema GI: BS+, soft, nontender Derm: no cyanosis or rash Psyche: normal mood and affect  Spirometry results reviewed today, see above Chest x-ray images from her last visit reviewed today showing clear emphysema.     Assessment & Plan:  COPD with bronchitis This has been a stable  interval for [name]. She is tolerating her Spiriva well without difficulty.  Plan: Continue Spiriva daliy She was educated on the importance of exercise, immunizations (which are up to date), and hand washing F/u 6 months     Current outpatient prescriptions:  .  Bilberry, Vaccinium myrtillus, (BILBERRY PO), Take by mouth., Disp: , Rfl:  .  dextromethorphan-guaiFENesin (MUCINEX DM) 30-600 MG 12hr tablet, Take 1 tablet by mouth 2 (two) times daily., Disp: , Rfl:  .  fluticasone (FLONASE) 50 MCG/ACT nasal spray, Place 2 sprays into both nostrils daily. Sometimes uses a second dose qhs, Disp: , Rfl:  .  furosemide (LASIX) 20 MG tablet, TAKE (1) TABLET BY MOUTH EACH MORNING., Disp: 90 tablet, Rfl: 0 .  lisinopril (PRINIVIL,ZESTRIL) 20 MG tablet, TAKE ONE TABLET BY MOUTH ONCE DAILY., Disp: 90 tablet, Rfl: 0 .  nortriptyline (PAMELOR) 10 MG capsule, Take 10 mg by mouth. Take 2 at night, Disp: , Rfl:  .  polyethylene glycol (MIRALAX / GLYCOLAX) packet, Take 17 g by mouth daily as needed. Constipation, Disp: , Rfl:  .  Tiotropium Bromide Monohydrate (SPIRIVA RESPIMAT) 1.25 MCG/ACT AERS, Inhale 2 puffs into the lungs daily., Disp: 4 g, Rfl: 5

## 2015-04-17 LAB — FUNGUS CULTURE W SMEAR

## 2015-04-18 ENCOUNTER — Encounter: Payer: Medicare Other | Admitting: Family Medicine

## 2015-04-21 ENCOUNTER — Encounter: Payer: Self-pay | Admitting: Family Medicine

## 2015-04-21 ENCOUNTER — Ambulatory Visit (INDEPENDENT_AMBULATORY_CARE_PROVIDER_SITE_OTHER): Payer: Medicare Other | Admitting: Family Medicine

## 2015-04-21 VITALS — BP 130/70 | Ht 62.0 in | Wt 161.2 lb

## 2015-04-21 DIAGNOSIS — Z0000 Encounter for general adult medical examination without abnormal findings: Secondary | ICD-10-CM

## 2015-04-21 DIAGNOSIS — R7303 Prediabetes: Secondary | ICD-10-CM | POA: Diagnosis not present

## 2015-04-21 DIAGNOSIS — E785 Hyperlipidemia, unspecified: Secondary | ICD-10-CM | POA: Diagnosis not present

## 2015-04-21 DIAGNOSIS — I10 Essential (primary) hypertension: Secondary | ICD-10-CM

## 2015-04-21 DIAGNOSIS — I779 Disorder of arteries and arterioles, unspecified: Secondary | ICD-10-CM | POA: Diagnosis not present

## 2015-04-21 DIAGNOSIS — G6289 Other specified polyneuropathies: Secondary | ICD-10-CM | POA: Diagnosis not present

## 2015-04-21 DIAGNOSIS — M170 Bilateral primary osteoarthritis of knee: Secondary | ICD-10-CM | POA: Insufficient documentation

## 2015-04-21 DIAGNOSIS — IMO0001 Reserved for inherently not codable concepts without codable children: Secondary | ICD-10-CM

## 2015-04-21 DIAGNOSIS — J449 Chronic obstructive pulmonary disease, unspecified: Secondary | ICD-10-CM | POA: Diagnosis not present

## 2015-04-21 DIAGNOSIS — I739 Peripheral vascular disease, unspecified: Secondary | ICD-10-CM

## 2015-04-21 MED ORDER — FUROSEMIDE 20 MG PO TABS: ORAL_TABLET | ORAL | Status: DC

## 2015-04-21 MED ORDER — LISINOPRIL 20 MG PO TABS: 20.0000 mg | ORAL_TABLET | Freq: Every day | ORAL | Status: DC

## 2015-04-21 NOTE — Progress Notes (Signed)
   Subjective:    Patient ID: [name], female    DOB: 1928/11/26, 82 y.o.   MRN: [id number]  HPI AWV- Annual Wellness Visit  The patient was seen for their annual wellness visit. The patient's past medical history, surgical history, and family history were reviewed. Pertinent vaccines were reviewed ( tetanus, pneumonia, shingles, flu) The patient's medication list was reviewed and updated.  The height and weight were entered. The patient's current BMI is: 29.49  Cognitive screening was completed. Outcome of Mini - Cog: passed  Falls within the past 6 months: none  Current tobacco usage: non-smoker (All patients who use tobacco were given written and verbal information on quitting)  Recent listing of emergency department/hospitalizations over the past year were reviewed.  current specialist the patient sees on a regular basis: feet and lung specialist   Medicare annual wellness visit patient questionnaire was reviewed.  A written screening schedule for the patient for the next 5-10 years was given. Appropriate discussion of followup regarding next visit was discussed.  Patient states that she has no concerns at this time.  She tries eat healthy she takes her blood pressure medicine on a regular basis She has noticed she is gaining some weight. She is trying to lose weight by watching how she eats is best she can She also has prediabetes and tries watch his starches and what she takes then. Patient has history of bilateral carotid artery disease but this is stable we will not be doing carotid ultrasound on this visit Patient has peripheral neuropathy in the feet possibly related to prediabetes on nortriptyline 1 Review of Systems    she denies any chest tightness pressure pain shortness breath nausea and diarrhea Objective:   Physical Exam Neck no masses eardrums normal throat is normal lungs are clear no crackles heart is regular no there is a slight early systolic  murmur otherwise normal Abdomen soft no masses extremities no edema breast exam is normal pelvic exam without uterus normal rectal exam normal   She does complain of arthralgias in both knees which is probably mild arthritis there is crepitus in the right knee    Assessment & Plan:  Colonoscopy not indicated. Up-to-date on immunizations Hyperlipidemia but HDL very good which balances out risk Prediabetes diet and exercise best way to approach Peripheral neuropathy nortriptyline as directed Follow-up in 6 months.

## 2015-04-22 ENCOUNTER — Ambulatory Visit (HOSPITAL_COMMUNITY)
Admission: RE | Admit: 2015-04-22 | Discharge: 2015-04-22 | Disposition: A | Discharge status: Home / Self Care | Payer: Medicare Other | Source: Ambulatory Visit | Attending: Family Medicine | Admitting: Family Medicine

## 2015-04-22 ENCOUNTER — Other Ambulatory Visit: Payer: Self-pay | Admitting: Family Medicine

## 2015-04-22 DIAGNOSIS — C50911 Malignant neoplasm of unspecified site of right female breast: Secondary | ICD-10-CM | POA: Insufficient documentation

## 2015-04-22 DIAGNOSIS — R928 Other abnormal and inconclusive findings on diagnostic imaging of breast: Secondary | ICD-10-CM

## 2015-04-22 DIAGNOSIS — N63 Unspecified lump in breast: Secondary | ICD-10-CM | POA: Diagnosis not present

## 2015-04-22 DIAGNOSIS — N6310 Unspecified lump in the right breast, unspecified quadrant: Secondary | ICD-10-CM

## 2015-04-22 DIAGNOSIS — C50811 Malignant neoplasm of overlapping sites of right female breast: Secondary | ICD-10-CM | POA: Diagnosis not present

## 2015-04-22 MED ORDER — LIDOCAINE-EPINEPHRINE (PF) 1 %-1:200000 IJ SOLN
INTRAMUSCULAR | Status: AC
  Filled 2015-04-22: qty 10

## 2015-04-22 MED ORDER — SODIUM BICARBONATE 4 % IV SOLN
INTRAVENOUS | Status: DC
Start: 2015-04-22 — End: 2015-04-23
  Filled 2015-04-22: qty 5

## 2015-04-24 ENCOUNTER — Telehealth: Payer: Self-pay | Admitting: Family Medicine

## 2015-04-24 NOTE — Telephone Encounter (Signed)
Please let the patient know that I did receive her biopsy results. I would like for her to come in today to be seen. Preferably this morning even 11:30 would be fine

## 2015-04-24 NOTE — Telephone Encounter (Signed)
Received a phone call from [place] office stating that the report for the biopsy done on patient's right breast was complete.  They recommend breast MRI given additional vague shadowing area in right breast and dcis component. The full report is not up yet due to [place] not completing the note. Dr.Jarosz recommends referral to a surgeon for patient and would like to know if we are going to refer or if we would like for them to refer her. Please review labs and pathology report in epic.

## 2015-04-25 ENCOUNTER — Ambulatory Visit (INDEPENDENT_AMBULATORY_CARE_PROVIDER_SITE_OTHER): Payer: Medicare Other | Admitting: Family Medicine

## 2015-04-25 ENCOUNTER — Encounter: Payer: Self-pay | Admitting: Family Medicine

## 2015-04-25 VITALS — BP 144/74 | Ht 62.0 in | Wt 161.0 lb

## 2015-04-25 DIAGNOSIS — D0512 Intraductal carcinoma in situ of left breast: Secondary | ICD-10-CM

## 2015-04-25 DIAGNOSIS — IMO0001 Reserved for inherently not codable concepts without codable children: Secondary | ICD-10-CM

## 2015-04-25 NOTE — Progress Notes (Signed)
Referral for Dr.Penland, and [place] surgery put in epic.

## 2015-04-25 NOTE — Telephone Encounter (Signed)
[place] 04/25/15

## 2015-04-25 NOTE — Progress Notes (Signed)
   Subjective:    Patient ID: [name], female    DOB: 01-Apr-1928, 82 y.o.   MRN: [id number]  HPIpt arrives today to go over mammogram results.     Review of Systems     Objective:   Physical Exam 15 minutes was spent today going over her mammogram results with biopsy procedure as well as a biopsy results also discussion was held today regarding what to do next. We will be working hard seen that she gets a referral to Psychologist, sport and exercise. We will also discuss case with oncology to see if they recommend MRI apparently the radiologist recommended MRI personally am not sure what that would add       Assessment & Plan:   breast cancer  referral to [facility] surgery Referral to oncology  May or may not need MRI we will try to clarify this.

## 2015-04-25 NOTE — Telephone Encounter (Signed)
Spoke with patient and informed her per Dr.Scott Luking- He received her biopsy results and would like to her today around 11:30 patient verbalized understanding and was transferred to front desk to schedule appointment.

## 2015-04-28 ENCOUNTER — Telehealth: Payer: Self-pay | Admitting: Family Medicine

## 2015-04-28 ENCOUNTER — Ambulatory Visit: Payer: Medicare Other | Admitting: Family Medicine

## 2015-04-28 NOTE — Telephone Encounter (Signed)
Pt wanting to let you know that she has an appt with Dr Dalbert Batman this Thursday

## 2015-04-28 NOTE — Telephone Encounter (Signed)
Excellent, please be certain to make sure that any necessary medical records have been forwarded to the surgeon

## 2015-05-01 ENCOUNTER — Encounter (HOSPITAL_COMMUNITY): Payer: Self-pay | Admitting: Hematology & Oncology

## 2015-05-01 ENCOUNTER — Other Ambulatory Visit: Payer: Self-pay | Admitting: General Surgery

## 2015-05-01 ENCOUNTER — Encounter (HOSPITAL_COMMUNITY): Payer: Medicare Other | Attending: Hematology & Oncology | Admitting: Hematology & Oncology

## 2015-05-01 VITALS — BP 144/55 | HR 88 | Temp 97.5°F | Resp 18 | Ht 62.0 in | Wt 163.0 lb

## 2015-05-01 DIAGNOSIS — Z90721 Acquired absence of ovaries, unilateral: Secondary | ICD-10-CM | POA: Diagnosis not present

## 2015-05-01 DIAGNOSIS — I10 Essential (primary) hypertension: Secondary | ICD-10-CM | POA: Diagnosis not present

## 2015-05-01 DIAGNOSIS — C50111 Malignant neoplasm of central portion of right female breast: Secondary | ICD-10-CM | POA: Diagnosis not present

## 2015-05-01 DIAGNOSIS — Z90710 Acquired absence of both cervix and uterus: Secondary | ICD-10-CM | POA: Diagnosis not present

## 2015-05-01 DIAGNOSIS — J449 Chronic obstructive pulmonary disease, unspecified: Secondary | ICD-10-CM | POA: Diagnosis not present

## 2015-05-01 DIAGNOSIS — C50911 Malignant neoplasm of unspecified site of right female breast: Secondary | ICD-10-CM

## 2015-05-01 DIAGNOSIS — Z9049 Acquired absence of other specified parts of digestive tract: Secondary | ICD-10-CM | POA: Diagnosis not present

## 2015-05-01 DIAGNOSIS — G5793 Unspecified mononeuropathy of bilateral lower limbs: Secondary | ICD-10-CM | POA: Diagnosis not present

## 2015-05-01 DIAGNOSIS — C50811 Malignant neoplasm of overlapping sites of right female breast: Secondary | ICD-10-CM | POA: Diagnosis not present

## 2015-05-01 DIAGNOSIS — C50411 Malignant neoplasm of upper-outer quadrant of right female breast: Secondary | ICD-10-CM

## 2015-05-01 DIAGNOSIS — Z9889 Other specified postprocedural states: Secondary | ICD-10-CM | POA: Diagnosis not present

## 2015-05-01 HISTORY — DX: Malignant neoplasm of unspecified site of right female breast: C50.911

## 2015-05-01 NOTE — Patient Instructions (Addendum)
[place] at [facility] Discharge Instructions  RECOMMENDATIONS MADE BY THE CONSULTANT AND ANY TEST RESULTS WILL BE SENT TO YOUR REFERRING PHYSICIAN.    Exam and discussion by Dr Whitney Muse today.  Have a MRI-doing that is a good thing, we are going to do this at [facility].  You can either have a lumpectomy or mastectomy, with a lumpectomy you would have to have radiation.  We usually only need a node biopsy if we think you are a candidate for chemotherapy.  When you take the pill it cuts the risks of you getting another breast cancer by 50%, and it also protects you from the cancer coming back in a different place (like lungs, liver, or bones).  ER+ (estrogen receptor positive) Invasive Ductal Carcinoma   Lupita Raider is our nurse navigator.  She is a good resource if you have any questions.  Her number is [phone].  Hildred Alamin will call you and check in and get you scheduled for a follow up appt. Please call the clinic if you have any questions or concerns.         Thank you for choosing [place] at [facility] to provide your oncology and hematology care.  To afford each patient quality time with our provider, please arrive at least 15 minutes before your scheduled appointment time.   Beginning January 23rd 2017 lab work for the [facility] will be done in the  Main lab at [facility] on 1st floor. If you have a lab appointment with the [facility] please come in thru the  Main Entrance and check in at the main information desk  You need to re-schedule your appointment should you arrive 10 or more minutes late.  We strive to give you quality time with our providers, and arriving late affects you and other patients whose appointments are after yours.  Also, if you no show three or more times for appointments you may be dismissed from the clinic at the providers discretion.     Again, thank you for choosing [facility].  Our hope is that these requests will decrease the amount of time that you wait before being seen by our physicians.       _____________________________________________________________  Should you have questions after your visit to [facility], please contact our office at [phone] between the hours of 8:30 a.m. and 4:30 p.m.  Voicemails left after 4:30 p.m. will not be returned until the following business day.  For prescription refill requests, have your pharmacy contact our office.

## 2015-05-01 NOTE — Progress Notes (Signed)
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 female is here because of referral from Dr. Wolfgang [name] for recent R breast cancer diagnosis. This was first discovered on routine screening mammogram. She undergone a breast biopsy with MRI order pending.   [name] is here alone. I personally reviewed and went over imaging studies and pathology results at length with the patient.   She saw [name] in [place] earlier today. She will see him again at the end of this coming week.   States, "I can't believe it", in reference to her diagnosis and began to cry. She has been called back for additional mammograms on her right breast previously. This year was the first time she had the 3-D mammogram. She is concerned that she might be too old for treatment.   She wanted her oncology care at [facility] because she likes to be independent and is able to drive herself to [place].  She is post-menopausal and still experiences intense hot flashes. She took hormones for 37 years. She previously had a tumor removed from her left breast, and discontinued hormones at that time. She notes the lesion in the L breast was benign. She believes that taking hormones are partially to blame for her current diagnosis. No family history of breast cancer that she is aware of.  She has not spoken with more than really close friends about her current diagnosis yet. She has other friends who she planned to tell once  she had more information about it. States, "I'd rather help than to be helped". She wants time to think and is not ready to hear "wive's tales".   Her appetite is "too good". She has no problems or bruising at her biopsy site, stating she put an ice pack on it and took good care of it with sponge baths. She does all her own yard work and house work.   She has had a colonoscopy.   She is here for evaluation and discussion of treatment options for newly diagnosed R breast cancer today.  MEDICAL HISTORY:  Past Medical History  Diagnosis Date  . Hypertension   . Hypercholesteremia   . Colon polyps   . PONV (postoperative nausea and vomiting)   . Osteoporosis   . Osteoarthritis   . Lung nodules     SURGICAL HISTORY: Past Surgical History  Procedure Laterality Date  . Abdominal hysterectomy    . Appendectomy    . Tonsillectomy    . Colonoscopy    . Bartholin gland cyst excision    . Left inguinal hernia repair    . Multiple toe surgeries    . Colonoscopy  03/25/2011    Procedure: COLONOSCOPY;  Surgeon: Rogene Houston, MD;  Location: [facility];  Service: Endoscopy;  Laterality: N/A;  9:30 am    SOCIAL HISTORY: Social History   Social History  . Marital Status: Widowed    Spouse Name: N/A  . Number of Children: N/A  . Years of Education: N/A   Occupational History  .  Not on file.   Social History Main Topics  . Smoking status: Former Smoker -- 2.00 packs/day for 50 years    Types: Cigarettes    Quit date: 03/15/1988  . Smokeless tobacco: Never Used  . Alcohol Use: No  . Drug Use: No  . Sexual Activity: Not on file   Other Topics Concern  . Not on file   Social History Narrative  Widowed 0 children She has nieces and nephews Worked as a IT consultant Ex smoker, quit in 1989.   FAMILY HISTORY: Family History  Problem Relation Age of Onset  . Colon cancer Father    has no family status information on file.  Father deceased at 75 yo of colon cancer  that spread to his bone. He was a smoker. Mother deceased at 22 yo of COPD and chronic heart failure. She was a non-smoker. 1 sister who is 45 years younger. No family history of breast cancer that she is aware of.  ALLERGIES:  is allergic to codeine; morphine; and neomycin-bacitracin zn-polymyx.  MEDICATIONS:  Current Outpatient Prescriptions  Medication Sig Dispense Refill  . Bilberry, Vaccinium myrtillus, (BILBERRY PO) Take by mouth.    . dextromethorphan-guaiFENesin (MUCINEX DM) 30-600 MG 12hr tablet Take 1 tablet by mouth 2 (two) times daily.    . fluticasone (FLONASE) 50 MCG/ACT nasal spray Place 2 sprays into both nostrils daily. Sometimes uses a second dose qhs    . furosemide (LASIX) 20 MG tablet TAKE (1) TABLET BY MOUTH EACH MORNING. 90 tablet 2  . lisinopril (PRINIVIL,ZESTRIL) 20 MG tablet Take 1 tablet (20 mg total) by mouth daily. 90 tablet 2  . nortriptyline (PAMELOR) 10 MG capsule Take 10 mg by mouth. Take 2 at night    . polyethylene glycol (MIRALAX / GLYCOLAX) packet Take 17 g by mouth daily as needed. Constipation    . Tiotropium Bromide Monohydrate (SPIRIVA RESPIMAT) 1.25 MCG/ACT AERS Inhale 2 puffs into the lungs daily. 4 g 5   No current facility-administered medications for this visit.    Review of Systems  Constitutional: Negative.  Negative for fever, chills, weight loss and malaise/fatigue.       Post-menopausal hot flashes  HENT: Negative.  Negative for congestion, hearing loss, nosebleeds, sore throat and tinnitus.   Eyes: Negative.  Negative for blurred vision, double vision, pain and discharge.  Respiratory: Negative.  Negative for cough, hemoptysis, sputum production, shortness of breath and wheezing.   Cardiovascular: Negative.  Negative for chest pain, palpitations, claudication, leg swelling and PND.  Gastrointestinal: Negative.  Negative for heartburn, nausea, vomiting, abdominal pain, diarrhea, constipation, blood in stool and melena.  Genitourinary:  Negative.  Negative for dysuria, urgency, frequency and hematuria.  Musculoskeletal: Negative.  Negative for myalgias, joint pain and falls.  Skin: Negative.  Negative for itching and rash.  Neurological: Negative.  Negative for dizziness, tingling, tremors, sensory change, speech change, focal weakness, seizures, loss of consciousness, weakness and headaches.  Endo/Heme/Allergies: Negative.  Does not bruise/bleed easily.  Psychiatric/Behavioral: Negative for depression, suicidal ideas, memory loss and substance abuse. The patient is nervous/anxious. The patient does not have insomnia.        Anxiety related to new breast cancer diagnosis.  All other systems reviewed and are negative.  14 point ROS was done and is otherwise as detailed above or in HPI   PHYSICAL EXAMINATION: ECOG PERFORMANCE STATUS: 0 - Asymptomatic  Filed Vitals:   05/01/15 1522  BP: 144/55  Pulse: 88  Temp: 97.5 F (36.4 C)  Resp: 18   Filed Weights   05/01/15 1522  Weight: 163 lb (73.936 kg)    Physical Exam  Constitutional: She is oriented to person, place, and time and well-developed, well-nourished, and in no distress.  Appears younger than stated age.  HENT:  Head: Normocephalic and atraumatic.  Nose: Nose normal.  Mouth/Throat: Oropharynx is clear and moist. No oropharyngeal exudate.  Dentures on top and bottom.  Eyes: Conjunctivae and EOM are normal. Pupils are equal, round, and reactive to light. Right eye exhibits no discharge. Left eye exhibits no discharge. No scleral icterus.  Neck: Normal range of motion. Neck supple. No tracheal deviation present. No thyromegaly present.  Cardiovascular: Normal rate, regular rhythm and normal heart sounds.  Exam reveals no gallop and no friction rub.   No murmur heard. Pulmonary/Chest: Effort normal and breath sounds normal. She has no wheezes. She has no rales.  Abdominal: Soft. Bowel sounds are normal. She exhibits no distension and no mass. There is no  tenderness. There is no rebound and no guarding.  Musculoskeletal: Normal range of motion. She exhibits no edema.  Lymphadenopathy:    She has no cervical adenopathy.  Neurological: She is alert and oriented to person, place, and time. She has normal reflexes. No cranial nerve deficit. Gait normal. Coordination normal.  Skin: Skin is warm and dry. No rash noted.  Psychiatric: Mood, memory, affect and judgment normal.  Nursing note and vitals reviewed.   LABORATORY DATA:  I have reviewed the data as listed Lab Results  Component Value Date   WBC 5.7 10/11/2014   HCT 40.4 10/11/2014   MCV 92 10/11/2014   PLT 290 10/11/2014   CMP     Component Value Date/Time   NA 139 04/08/2015 0814   NA 142 04/03/2014 1556   K 4.6 04/08/2015 0814   CL 98 04/08/2015 0814   CO2 26 04/08/2015 0814   GLUCOSE 98 04/08/2015 0814   GLUCOSE 103* 04/03/2014 1556   BUN 17 04/08/2015 0814   BUN 23 04/03/2014 1556   CREATININE 0.91 04/08/2015 0814   CREATININE 0.83 04/03/2014 1556   CALCIUM 10.3 04/08/2015 0814   PROT 6.8 04/08/2015 0814   ALBUMIN 4.5 04/08/2015 0814   AST 28 04/08/2015 0814   ALT 22 04/08/2015 0814   ALKPHOS 68 04/08/2015 0814   BILITOT 0.6 04/08/2015 0814   GFRNONAA 58* 04/08/2015 0814   GFRAA 67 04/08/2015 0814   PATHOLOGY     RADIOGRAPHIC STUDIES: I have personally reviewed the radiological images as listed and agreed with the findings in the report. ADDENDUM REPORT: 04/24/2015 14:56 ADDENDUM: Pathology of the breast, right, needle core biopsy, 9 o'clock revealed INVASIVE DUCTAL CARCINOMA, GRADE 1. CARCINOMA IN SITU IS PRESENT. This was found to be concordant by Dr. Shelly Bombard. Recommendations: Recommend breast MRI given additional vague shadowing area in outer right breast and DCIS component. Recommendations were relayed to Dr. Lance Sell nurse on 04/24/15 at 11:50 AM by Jetta Lout, [facility]). She asked that when the radiologist gives the results to the  patient, please tell the patient to contact their office. Dr. Wolfgang [name] will review pathology report and recommendations and make decision regarding MRI and surgical referral. Results and instructions to call Dr. Lance Sell office were relayed to the patient by Dr. Enriqueta Shutter. She stated she has done well following the biopsy. Electronically Signed  By: Everlean Alstrom M.D.  On: 04/24/2015 14:56     Study Result     CLINICAL DATA: 82 year old female with a mass/  an area of distortion in the outer right breast peer  EXAM: ULTRASOUND GUIDED RIGHT BREAST CORE NEEDLE BIOPSY  COMPARISON: Previous exam(s).  PROCEDURE: I met with the patient and we discussed the procedure of ultrasound-guided biopsy, including benefits and alternatives. We discussed the high likelihood of a successful procedure. We discussed the risks of the procedure including infection, bleeding, tissue injury, clip migration, and inadequate sampling. Informed written consent was given. The usual time-out protocol was performed immediately prior to the procedure.  Using sterile technique and 1% Lidocaine as local anesthetic, under direct ultrasound visualization, a 12 gauge vacuum-assisted device was used to perform biopsy of the mass/ distortion in the outer right breast at the approximate 9 o'clock positionusing a lateral to medial approach. At the conclusion of the procedure, a wing shaped tissue marker clip was deployed into the biopsy cavity. Follow-up 2-view mammogram was performed and dictated separately.  IMPRESSION: Ultrasound-guided biopsy of a mass/area of distortion in the outer right breast.  Electronically Signed: By: Everlean Alstrom M.D. On: 04/22/2015 16:59   Study Result     CLINICAL DATA: Screening recall for possible right breast distortion.  EXAM: DIGITAL DIAGNOSTIC RIGHT MAMMOGRAM WITH 3D TOMOSYNTHESIS AND CAD  RIGHT BREAST ULTRASOUND  COMPARISON: Previous  exam(s).  ACR Breast Density Category b: There are scattered areas of fibroglandular density.  FINDINGS: Spot compression CC and MLO tomosynthesis was performed of the outer right breast demonstrating a persistent area of subtle distortion.  Mammographic images were processed with CAD.  Physical examination of the outer right breast reveals focal pain at the approximate 9 o'clock position 4 cm from the nipple, with mild underlying thickening.  Targeted ultrasound of the right breast was performed demonstrating a small shadowing mass at 9 o'clock 4 cm from the nipple measuring 0.4 x 0.4 by 0.4 cm. This is felt to correspond with mammography findings. No definite lymphadenopathy seen in the right axilla.  IMPRESSION: Suspicious right breast mass.  RECOMMENDATION: Ultrasound-guided biopsy of the mass in the outer right breast is recommended. This will subsequently be performed and dictated separately.  I have discussed the findings and recommendations with the patient. Results were also provided in writing at the conclusion of the visit. If applicable, a reminder letter will be sent to the patient regarding the next appointment.  BI-RADS CATEGORY 4: Suspicious.   Electronically Signed  By: Everlean Alstrom M.D.  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 We discussed treatment options including breast conservation, mastectomy, radiotherapy, and endocrine therapy.We spent time today in consultation discussing types of breast cancer. She was  provided with reading information from the NCCN and also given out patient navigation book about breast cancer.  I spent time discussing ER positivity and HER 2 negativity; ie. The significance of these markers and treatments used for ER + tumors. We discussed endocrine therapy in some detail. She met with our patient navigator, Hildred Alamin, today.  She does have an excellent PS, however based upon imaging, ER status, age I do not feel she would receive much benefit from chemotherapy and therefore I do not feel a sentinel node would  be of particular benefit.   She met with [name] of [facility] earlier today and will follow up with him next week.   The patient had an order in place for a breast MRI at [place] by [name]. She would prefer to have the MRI done in [place]. I will have her MRI transferred to be performed here in [place] at the patient's request.  I have made a referral for consultation with radiation oncology in [place].   She will return for follow up 1 week after surgery.   All questions were answered. The patient knows to call the clinic with any problems, questions or concerns.  This document serves as a record of services personally performed by Ancil Linsey, MD. It was created on her behalf by Arlyce Harman, a trained medical scribe. The creation of this record is based on the scribe's personal observations and the provider's statements to them. This document has been checked and approved by the attending provider.  I have reviewed the above documentation for accuracy and completeness, and I agree with the above.  This note was electronically signed.    Molli Hazard, MD  05/01/2015 4:15 PM

## 2015-05-06 ENCOUNTER — Inpatient Hospital Stay: Admission: RE | Admit: 2015-05-06 | Payer: Medicare Other | Source: Ambulatory Visit

## 2015-05-06 ENCOUNTER — Ambulatory Visit
Admission: RE | Admit: 2015-05-06 | Discharge: 2015-05-06 | Disposition: A | Discharge status: Home / Self Care | Payer: Medicare Other | Source: Ambulatory Visit | Attending: Hematology & Oncology | Admitting: Hematology & Oncology

## 2015-05-06 DIAGNOSIS — C50411 Malignant neoplasm of upper-outer quadrant of right female breast: Secondary | ICD-10-CM

## 2015-05-06 DIAGNOSIS — D0511 Intraductal carcinoma in situ of right breast: Secondary | ICD-10-CM | POA: Diagnosis not present

## 2015-05-08 ENCOUNTER — Telehealth: Payer: Self-pay | Admitting: Pulmonary Disease

## 2015-05-08 ENCOUNTER — Other Ambulatory Visit: Payer: Self-pay | Admitting: General Surgery

## 2015-05-08 DIAGNOSIS — Z90721 Acquired absence of ovaries, unilateral: Secondary | ICD-10-CM | POA: Diagnosis not present

## 2015-05-08 DIAGNOSIS — C50111 Malignant neoplasm of central portion of right female breast: Secondary | ICD-10-CM | POA: Diagnosis not present

## 2015-05-08 DIAGNOSIS — G5793 Unspecified mononeuropathy of bilateral lower limbs: Secondary | ICD-10-CM | POA: Diagnosis not present

## 2015-05-08 DIAGNOSIS — C50511 Malignant neoplasm of lower-outer quadrant of right female breast: Secondary | ICD-10-CM

## 2015-05-08 DIAGNOSIS — I10 Essential (primary) hypertension: Secondary | ICD-10-CM | POA: Diagnosis not present

## 2015-05-08 DIAGNOSIS — Z90710 Acquired absence of both cervix and uterus: Secondary | ICD-10-CM | POA: Diagnosis not present

## 2015-05-08 DIAGNOSIS — Z9049 Acquired absence of other specified parts of digestive tract: Secondary | ICD-10-CM | POA: Diagnosis not present

## 2015-05-08 DIAGNOSIS — Z9889 Other specified postprocedural states: Secondary | ICD-10-CM | POA: Diagnosis not present

## 2015-05-08 DIAGNOSIS — J449 Chronic obstructive pulmonary disease, unspecified: Secondary | ICD-10-CM | POA: Diagnosis not present

## 2015-05-08 NOTE — Telephone Encounter (Signed)
Received call from Apolonio Schneiders w/ [facility] on AFB results: 4 week culture is positive for AFB  Results to be faxed to [phone] Will route to BQ

## 2015-05-08 NOTE — Telephone Encounter (Signed)
Noted, will await speciation

## 2015-05-09 DIAGNOSIS — H2513 Age-related nuclear cataract, bilateral: Secondary | ICD-10-CM | POA: Diagnosis not present

## 2015-05-09 DIAGNOSIS — H35373 Puckering of macula, bilateral: Secondary | ICD-10-CM | POA: Diagnosis not present

## 2015-05-09 DIAGNOSIS — H40013 Open angle with borderline findings, low risk, bilateral: Secondary | ICD-10-CM | POA: Diagnosis not present

## 2015-05-12 ENCOUNTER — Other Ambulatory Visit: Payer: Self-pay | Admitting: General Surgery

## 2015-05-12 DIAGNOSIS — C50511 Malignant neoplasm of lower-outer quadrant of right female breast: Secondary | ICD-10-CM

## 2015-05-18 NOTE — H&P (Signed)
[name]  Location: [facility] Patient #: [id number] DOB: 11/18/1928 Widowed / Language: English / Race: White Female       History of Present Illness  The patient is a 82 year old female who presents with breast cancer. This is a pleasant 82-year-old Caucasian female from [place] who returns for a second preop visit for surgical planning regarding her right breast cancer.  Her PCP is Dr. Scott looking and her oncologist [facility].  Recent imaging studies show a 4 mm density in the right breast at the 9 o'clock position, 4 cm from the nipple. Nodes looked normal. They also thought there might be  an area of subtle distortion in the outer right breast laterally and they recommended MRI. MGM density category C. Image guided biopsy of the index lesion showed invasive ductal carcinoma, grade 1, ER 100%, PR 0, ki- 67 10%, HER-2 negative. We subsequently performed a MRI which looks good and shows only the single area where the breast cancer is. There are no other enhancing areas of concern. This is good news. She has seen Dr. Penland who has indicated that she does not think that she will need chemotherapy but has suggested that she will need both radiation therapy and antiestrogen therapy. Dr. Penland will make a referral to radiation oncology in [place]..  Comorbidities include history of abdominal hysterectomy, BSO for endometriosis many years ago. Appendectomy. Left breast biopsy for benign disease. Benign hypertension. COPD. Neuropathy of feet. Exploratory laparoscopy.     family history is negative She is a widow and lives alone. Independent. Drives her car. Has several friends locally but no family. Quit tobacco 40 years ago.  She will be scheduled for right breast partial mastectomy with radioactive seed localization. She does not need a sentinel node biopsy I discussed the indications, details, techniques, and  numerous risk of surgery with her. She is aware the risk of bleeding, infection, reoperation for positive margins, nerve damage, chronic pain, skin necrosis, cosmetic deformity, and other unforeseen problems.    She understands all these issues well. All of her questions were answered. She agrees with this plan. She knows this is outpatient surgery and one of her friends will need to drive her home and stay with her overnight.   Allergies  Codeine Sulfate *ANALGESICS - OPIOID* Morphine Sulfate *ANALGESICS - OPIOID* Neomycin Sulfate *Aminoglycosides  Medication History Doxycycline Hyclate (100MG Tablet, Oral) Active. Furosemide (20MG Tablet, Oral) Active. Lisinopril (20MG Tablet, Oral) Active. Nortriptyline HCl (10MG Capsule, Oral) Active. Tiotropium Bromide Monohydrate (1.25MCG/ACT Aerosol Soln, Inhalation) Active. MiraLax (Oral) Active. Flonase Allergy Relief (50MCG/ACT Suspension, Nasal) Active. Mucinex DM (30-600MG Tablet ER 12HR, Oral) Active. Bilberry (100MG Capsule, Oral) Active. Medications Reconciled  Vitals   Weight: 162 lb Height: 62in Body Surface Area: 1.75 m Body Mass Index: 29.63 kg/m  Temp.: 98.3F(Temporal)  Pulse: 64 (Regular)  BP: 130/68 (Sitting, Left Arm, Standard)       Physical Exam  General Mental Status-Alert. General Appearance-Not in acute distress. Build & Nutrition-Well nourished. Posture-Normal posture. Gait-Normal.  Head and Neck Head-normocephalic, atraumatic with no lesions or palpable masses. Trachea-midline. Thyroid Gland Characteristics - normal size and consistency and no palpable nodules.  Chest and Lung Exam Chest and lung exam reveals -on auscultation, normal breath sounds, no adventitious sounds and normal vocal resonance.  Breast Note: Breasts are medium size. Atrophic. Biopsy site lateral right breast well healed. No hematoma. No palpable mass in either breast. Nipple and    [name]  Location: [facility] Patient #: [id number] DOB: 11/18/1928 Widowed / Language: English / Race: White Female       History of Present Illness  The patient is a 82 year old female who presents with breast cancer. This is a pleasant 82-year-old Caucasian female from [place] who returns for a second preop visit for surgical planning regarding her right breast cancer.  Her PCP is Dr. Scott looking and her oncologist [facility].  Recent imaging studies show a 4 mm density in the right breast at the 9 o'clock position, 4 cm from the nipple. Nodes looked normal. They also thought there might be  an area of subtle distortion in the outer right breast laterally and they recommended MRI. MGM density category C. Image guided biopsy of the index lesion showed invasive ductal carcinoma, grade 1, ER 100%, PR 0, ki- 67 10%, HER-2 negative. We subsequently performed a MRI which looks good and shows only the single area where the breast cancer is. There are no other enhancing areas of concern. This is good news. She has seen Dr. Penland who has indicated that she does not think that she will need chemotherapy but has suggested that she will need both radiation therapy and antiestrogen therapy. Dr. Penland will make a referral to radiation oncology in [place]..  Comorbidities include history of abdominal hysterectomy, BSO for endometriosis many years ago. Appendectomy. Left breast biopsy for benign disease. Benign hypertension. COPD. Neuropathy of feet. Exploratory laparoscopy.     family history is negative She is a widow and lives alone. Independent. Drives her car. Has several friends locally but no family. Quit tobacco 40 years ago.  She will be scheduled for right breast partial mastectomy with radioactive seed localization. She does not need a sentinel node biopsy I discussed the indications, details, techniques, and  numerous risk of surgery with her. She is aware the risk of bleeding, infection, reoperation for positive margins, nerve damage, chronic pain, skin necrosis, cosmetic deformity, and other unforeseen problems.    She understands all these issues well. All of her questions were answered. She agrees with this plan. She knows this is outpatient surgery and one of her friends will need to drive her home and stay with her overnight.   Allergies  Codeine Sulfate *ANALGESICS - OPIOID* Morphine Sulfate *ANALGESICS - OPIOID* Neomycin Sulfate *Aminoglycosides  Medication History Doxycycline Hyclate (100MG Tablet, Oral) Active. Furosemide (20MG Tablet, Oral) Active. Lisinopril (20MG Tablet, Oral) Active. Nortriptyline HCl (10MG Capsule, Oral) Active. Tiotropium Bromide Monohydrate (1.25MCG/ACT Aerosol Soln, Inhalation) Active. MiraLax (Oral) Active. Flonase Allergy Relief (50MCG/ACT Suspension, Nasal) Active. Mucinex DM (30-600MG Tablet ER 12HR, Oral) Active. Bilberry (100MG Capsule, Oral) Active. Medications Reconciled  Vitals   Weight: 162 lb Height: 62in Body Surface Area: 1.75 m Body Mass Index: 29.63 kg/m  Temp.: 98.3F(Temporal)  Pulse: 64 (Regular)  BP: 130/68 (Sitting, Left Arm, Standard)       Physical Exam  General Mental Status-Alert. General Appearance-Not in acute distress. Build & Nutrition-Well nourished. Posture-Normal posture. Gait-Normal.  Head and Neck Head-normocephalic, atraumatic with no lesions or palpable masses. Trachea-midline. Thyroid Gland Characteristics - normal size and consistency and no palpable nodules.  Chest and Lung Exam Chest and lung exam reveals -on auscultation, normal breath sounds, no adventitious sounds and normal vocal resonance.  Breast Note: Breasts are medium size. Atrophic. Biopsy site lateral right breast well healed. No hematoma. No palpable mass in either breast. Nipple and

## 2015-05-19 NOTE — Pre-Procedure Instructions (Signed)
[name]  05/19/2015      [facility] - [place], [address] [place]  [address] Phone: [phone] Fax: [phone]    Your procedure is scheduled on   Friday 05/23/15  Report to [facility] Admitting at 530 A.M.  Call this number if you have problems the morning of surgery:  [phone]   Remember:  Do not eat food or drink liquids after midnight.  Take these medicines the morning of surgery with A SIP OF WATER   SPIRIVA  (STOP ASPIRIN, COUMADIN, PLAVIX, EFFIENT, HERBAL MEDICINES, IBUPROFEN/ MOTRIN/ ADVIL, GOODY POWDERS/ BC'S)   Do not wear jewelry, make-up or nail polish.  Do not wear lotions, powders, or perfumes.  You may wear deodorant.  Do not shave 48 hours prior to surgery.  Men may shave face and neck.  Do not bring valuables to the hospital.  [facility] is not responsible for any belongings or valuables.  Contacts, dentures or bridgework may not be worn into surgery.  Leave your suitcase in the car.  After surgery it may be brought to your room.  For patients admitted to the hospital, discharge time will be determined by your treatment team.  Patients discharged the day of surgery will not be allowed to drive home.   Name and phone number of your driver:   Special instructions:  [facility] - Preparing for Surgery  Before surgery, you can play an important role.  Because skin is not sterile, your skin needs to be as free of germs as possible.  You can reduce the number of germs on you skin by washing with CHG (chlorahexidine gluconate) soap before surgery.  CHG is an antiseptic cleaner which kills germs and bonds with the skin to continue killing germs even after washing.  Please DO NOT use if you have an allergy to CHG or antibacterial soaps.  If your skin becomes reddened/irritated stop using the CHG and inform your nurse when you arrive at Short Stay.  Do not shave (including legs and underarms) for at  least 48 hours prior to the first CHG shower.  You may shave your face.  Please follow these instructions carefully:   1.  Shower with CHG Soap the night before surgery and the                                morning of Surgery.  2.  If you choose to wash your hair, wash your hair first as usual with your       normal shampoo.  3.  After you shampoo, rinse your hair and body thoroughly to remove the                      Shampoo.  4.  Use CHG as you would any other liquid soap.  You can apply chg directly       to the skin and wash gently with scrungie or a clean washcloth.  5.  Apply the CHG Soap to your body ONLY FROM THE NECK DOWN.        Do not use on open wounds or open sores.  Avoid contact with your eyes,       ears, mouth and genitals (private parts).  Wash genitals (private parts)       with your normal soap.  6.  Wash thoroughly, paying  special attention to the area where your surgery        will be performed.  7.  Thoroughly rinse your body with warm water from the neck down.  8.  DO NOT shower/wash with your normal soap after using and rinsing off       the CHG Soap.  9.  Pat yourself dry with a clean towel.            10.  Wear clean pajamas.            11.  Place clean sheets on your bed the night of your first shower and do not        sleep with pets.  Day of Surgery  Do not apply any lotions/deoderants the morning of surgery.  Please wear clean clothes to the hospital/surgery center.    Please read over the following fact sheets that you were given. Pain Booklet, Coughing and Deep Breathing and Surgical Site Infection Prevention

## 2015-05-20 ENCOUNTER — Encounter (HOSPITAL_COMMUNITY)
Admission: RE | Admit: 2015-05-20 | Discharge: 2015-05-20 | Disposition: A | Discharge status: Home / Self Care | Payer: Medicare Other | Source: Ambulatory Visit | Attending: General Surgery | Admitting: General Surgery

## 2015-05-20 ENCOUNTER — Encounter (HOSPITAL_COMMUNITY): Payer: Self-pay

## 2015-05-20 DIAGNOSIS — Z90710 Acquired absence of both cervix and uterus: Secondary | ICD-10-CM | POA: Diagnosis not present

## 2015-05-20 DIAGNOSIS — G629 Polyneuropathy, unspecified: Secondary | ICD-10-CM | POA: Diagnosis not present

## 2015-05-20 DIAGNOSIS — C50411 Malignant neoplasm of upper-outer quadrant of right female breast: Secondary | ICD-10-CM | POA: Diagnosis not present

## 2015-05-20 DIAGNOSIS — E119 Type 2 diabetes mellitus without complications: Secondary | ICD-10-CM | POA: Diagnosis not present

## 2015-05-20 DIAGNOSIS — I10 Essential (primary) hypertension: Secondary | ICD-10-CM | POA: Diagnosis not present

## 2015-05-20 DIAGNOSIS — Z9049 Acquired absence of other specified parts of digestive tract: Secondary | ICD-10-CM | POA: Diagnosis not present

## 2015-05-20 DIAGNOSIS — E785 Hyperlipidemia, unspecified: Secondary | ICD-10-CM | POA: Diagnosis not present

## 2015-05-20 DIAGNOSIS — Z87891 Personal history of nicotine dependence: Secondary | ICD-10-CM | POA: Diagnosis not present

## 2015-05-20 DIAGNOSIS — Z9889 Other specified postprocedural states: Secondary | ICD-10-CM | POA: Diagnosis not present

## 2015-05-20 DIAGNOSIS — J449 Chronic obstructive pulmonary disease, unspecified: Secondary | ICD-10-CM | POA: Diagnosis not present

## 2015-05-20 HISTORY — DX: Type 2 diabetes mellitus without complications: E11.9

## 2015-05-20 HISTORY — DX: Reserved for inherently not codable concepts without codable children: IMO0001

## 2015-05-20 HISTORY — DX: Malignant (primary) neoplasm, unspecified: C80.1

## 2015-05-20 HISTORY — DX: Chronic obstructive pulmonary disease, unspecified: J44.9

## 2015-05-20 LAB — CBC
HEMATOCRIT: 38.6 % (ref 36.0–46.0)
Hemoglobin: 12.4 g/dL (ref 12.0–15.0)
MCH: 30.7 pg (ref 26.0–34.0)
MCHC: 32.1 g/dL (ref 30.0–36.0)
MCV: 95.5 fL (ref 78.0–100.0)
Platelets: 248 10*3/uL (ref 150–400)
RBC: 4.04 MIL/uL (ref 3.87–5.11)
RDW: 13.1 % (ref 11.5–15.5)
WBC: 5.5 10*3/uL (ref 4.0–10.5)

## 2015-05-20 LAB — BASIC METABOLIC PANEL
Anion gap: 12 (ref 5–15)
BUN: 18 mg/dL (ref 6–20)
CALCIUM: 10 mg/dL (ref 8.9–10.3)
CO2: 25 mmol/L (ref 22–32)
CREATININE: 1.03 mg/dL — AB (ref 0.44–1.00)
Chloride: 105 mmol/L (ref 101–111)
GFR calc non Af Amer: 49 mL/min — ABNORMAL LOW (ref >60)
GFR, EST AFRICAN AMERICAN: 56 mL/min — AB (ref >60)
Glucose, Bld: 99 mg/dL (ref 65–99)
Potassium: 4.9 mmol/L (ref 3.5–5.1)
Sodium: 142 mmol/L (ref 135–145)

## 2015-05-20 LAB — GLUCOSE, CAPILLARY: GLUCOSE-CAPILLARY: 93 mg/dL (ref 65–99)

## 2015-05-21 NOTE — Progress Notes (Signed)
Anesthesia Chart Review:  Pt is an 82 year old female scheduled for R breast lumpectomy with radioactive seed localization on 05/23/2015 with Dr. Dalbert Batman.   PMH includes:  HTN, prediabetes, hyperlipidemia, COPD, lung nodules, breast cancer, post-op N/V. Former smoker. BMI 30.   Medications include: lasix, lisinopril, spiriva.   Preoperative labs reviewed.    Chest x-ray 12/25/14 reviewed. COPD. There is no evidence of pneumonia, CHF, nor other acute cardiopulmonary abnormality.  EKG 05/20/15: NSR. First degree heart block. Left atrial enlargement  Carotid duplex 10/16/14: Stable minor carotid atherosclerosis. No hemodynamically significant ICA stenosis by ultrasound. Degree of narrowing estimated less than 50% bilaterally  Pulmonologist is Dr. Lake Bells. AFB culture done in January was positive; it is negative for TB, other DNA probe results pending.   Reviewed case with Dr. Lissa Hoard  If no changes, I anticipate pt can proceed with surgery as scheduled.   Willeen Cass, FNP-BC [facility]/Anesthesiology Phone: [phone] 05/21/2015 11:53 AM

## 2015-05-22 ENCOUNTER — Ambulatory Visit
Admission: RE | Admit: 2015-05-22 | Discharge: 2015-05-22 | Disposition: A | Discharge status: Home / Self Care | Payer: Medicare Other | Source: Ambulatory Visit | Attending: General Surgery | Admitting: General Surgery

## 2015-05-22 DIAGNOSIS — C50911 Malignant neoplasm of unspecified site of right female breast: Secondary | ICD-10-CM | POA: Diagnosis not present

## 2015-05-22 DIAGNOSIS — C50511 Malignant neoplasm of lower-outer quadrant of right female breast: Secondary | ICD-10-CM

## 2015-05-22 MED ORDER — CEFAZOLIN SODIUM-DEXTROSE 2-3 GM-% IV SOLR
2.0000 g | INTRAVENOUS | Status: AC
  Administered 2015-05-23: 2 g via INTRAVENOUS
  Filled 2015-05-22: qty 50

## 2015-05-22 NOTE — Anesthesia Preprocedure Evaluation (Addendum)
Anesthesia Evaluation  Patient identified by MRN, date of birth, ID band Patient awake    Reviewed: Allergy & Precautions, NPO status , Patient's Chart, lab work & pertinent test results  History of Anesthesia Complications (+) PONV  Airway Mallampati: II  TM Distance: >3 FB Neck ROM: Full    Dental  (+) Lower Dentures, Upper Dentures   Pulmonary shortness of breath, COPD,  COPD inhaler, former smoker,    breath sounds clear to auscultation       Cardiovascular hypertension, Pt. on medications + Peripheral Vascular Disease   Rhythm:Regular Rate:Normal     Neuro/Psych negative neurological ROS     GI/Hepatic negative GI ROS, Neg liver ROS,   Endo/Other  diabetes, Well Controlled  Renal/GU negative Renal ROS     Musculoskeletal  (+) Arthritis ,   Abdominal   Peds  Hematology negative hematology ROS (+)   Anesthesia Other Findings   Reproductive/Obstetrics                          Lab Results  Component Value Date   WBC 5.5 05/20/2015   HGB 12.4 05/20/2015   HCT 38.6 05/20/2015   MCV 95.5 05/20/2015   PLT 248 05/20/2015   Lab Results  Component Value Date   CREATININE 1.03* 05/20/2015   BUN 18 05/20/2015   NA 142 05/20/2015   K 4.9 05/20/2015   CL 105 05/20/2015   CO2 25 05/20/2015    Anesthesia Physical Anesthesia Plan  ASA: III  Anesthesia Plan: General   Post-op Pain Management:    Induction: Intravenous  Airway Management Planned: LMA  Additional Equipment:   Intra-op Plan:   Post-operative Plan: Extubation in OR  Informed Consent: I have reviewed the patients History and Physical, chart, labs and discussed the procedure including the risks, benefits and alternatives for the proposed anesthesia with the patient or authorized representative who has indicated his/her understanding and acceptance.   Dental advisory given  Plan Discussed with: CRNA,  Anesthesiologist and Surgeon  Anesthesia Plan Comments: (TIVA. Pt with severe PONV with every anesthetic.)      Anesthesia Quick Evaluation

## 2015-05-23 ENCOUNTER — Encounter (HOSPITAL_COMMUNITY)
Admission: RE | Disposition: A | Discharge status: Home / Self Care | Payer: Self-pay | Source: Ambulatory Visit | Attending: General Surgery

## 2015-05-23 ENCOUNTER — Ambulatory Visit
Admission: RE | Admit: 2015-05-23 | Discharge: 2015-05-23 | Disposition: A | Discharge status: Home / Self Care | Payer: Medicare Other | Source: Ambulatory Visit | Attending: General Surgery | Admitting: General Surgery

## 2015-05-23 ENCOUNTER — Encounter (HOSPITAL_COMMUNITY): Payer: Self-pay | Admitting: *Deleted

## 2015-05-23 ENCOUNTER — Ambulatory Visit (HOSPITAL_COMMUNITY): Payer: Medicare Other | Admitting: Anesthesiology

## 2015-05-23 ENCOUNTER — Ambulatory Visit (HOSPITAL_COMMUNITY)
Admission: RE | Admit: 2015-05-23 | Discharge: 2015-05-23 | Disposition: A | Discharge status: Home / Self Care | Payer: Medicare Other | Source: Ambulatory Visit | Attending: General Surgery | Admitting: General Surgery

## 2015-05-23 ENCOUNTER — Ambulatory Visit (HOSPITAL_COMMUNITY): Payer: Medicare Other | Admitting: Emergency Medicine

## 2015-05-23 DIAGNOSIS — C50511 Malignant neoplasm of lower-outer quadrant of right female breast: Secondary | ICD-10-CM

## 2015-05-23 DIAGNOSIS — I10 Essential (primary) hypertension: Secondary | ICD-10-CM | POA: Diagnosis not present

## 2015-05-23 DIAGNOSIS — G629 Polyneuropathy, unspecified: Secondary | ICD-10-CM | POA: Insufficient documentation

## 2015-05-23 DIAGNOSIS — C50411 Malignant neoplasm of upper-outer quadrant of right female breast: Secondary | ICD-10-CM | POA: Insufficient documentation

## 2015-05-23 DIAGNOSIS — Z87891 Personal history of nicotine dependence: Secondary | ICD-10-CM | POA: Insufficient documentation

## 2015-05-23 DIAGNOSIS — E785 Hyperlipidemia, unspecified: Secondary | ICD-10-CM | POA: Insufficient documentation

## 2015-05-23 DIAGNOSIS — Z9049 Acquired absence of other specified parts of digestive tract: Secondary | ICD-10-CM | POA: Insufficient documentation

## 2015-05-23 DIAGNOSIS — J449 Chronic obstructive pulmonary disease, unspecified: Secondary | ICD-10-CM | POA: Diagnosis not present

## 2015-05-23 DIAGNOSIS — E119 Type 2 diabetes mellitus without complications: Secondary | ICD-10-CM | POA: Insufficient documentation

## 2015-05-23 DIAGNOSIS — Z9889 Other specified postprocedural states: Secondary | ICD-10-CM | POA: Insufficient documentation

## 2015-05-23 DIAGNOSIS — Z90710 Acquired absence of both cervix and uterus: Secondary | ICD-10-CM | POA: Insufficient documentation

## 2015-05-23 DIAGNOSIS — C50911 Malignant neoplasm of unspecified site of right female breast: Secondary | ICD-10-CM | POA: Diagnosis not present

## 2015-05-23 HISTORY — PX: BREAST LUMPECTOMY WITH RADIOACTIVE SEED LOCALIZATION: SHX6424

## 2015-05-23 LAB — GLUCOSE, CAPILLARY
GLUCOSE-CAPILLARY: 68 mg/dL (ref 65–99)
Glucose-Capillary: 79 mg/dL (ref 65–99)
Glucose-Capillary: 93 mg/dL (ref 65–99)

## 2015-05-23 SURGERY — BREAST LUMPECTOMY WITH RADIOACTIVE SEED LOCALIZATION
Anesthesia: General | Site: Breast | Laterality: Right

## 2015-05-23 MED ORDER — FENTANYL CITRATE (PF) 100 MCG/2ML IJ SOLN
INTRAMUSCULAR | Status: DC | PRN
  Administered 2015-05-23: 25 ug via INTRAVENOUS

## 2015-05-23 MED ORDER — BUPIVACAINE-EPINEPHRINE 0.25% -1:200000 IJ SOLN
INTRAMUSCULAR | Status: DC | PRN
  Administered 2015-05-23: 10 mL

## 2015-05-23 MED ORDER — LACTATED RINGERS IV SOLN
INTRAVENOUS | Status: DC | PRN
  Administered 2015-05-23 (×2): via INTRAVENOUS

## 2015-05-23 MED ORDER — 0.9 % SODIUM CHLORIDE (POUR BTL) OPTIME
TOPICAL | Status: DC | PRN
  Administered 2015-05-23: 1000 mL

## 2015-05-23 MED ORDER — BUPIVACAINE-EPINEPHRINE (PF) 0.25% -1:200000 IJ SOLN
INTRAMUSCULAR | Status: AC
  Filled 2015-05-23: qty 30

## 2015-05-23 MED ORDER — EPHEDRINE SULFATE 50 MG/ML IJ SOLN
INTRAMUSCULAR | Status: AC
  Filled 2015-05-23: qty 1

## 2015-05-23 MED ORDER — MIDAZOLAM HCL 5 MG/5ML IJ SOLN
INTRAMUSCULAR | Status: DC | PRN
  Administered 2015-05-23 (×3): 0.5 mg via INTRAVENOUS

## 2015-05-23 MED ORDER — HYDROCODONE-ACETAMINOPHEN 5-325 MG PO TABS: 1.0000 | ORAL_TABLET | Freq: Four times a day (QID) | ORAL | Status: DC | PRN

## 2015-05-23 MED ORDER — SUCCINYLCHOLINE CHLORIDE 20 MG/ML IJ SOLN
INTRAMUSCULAR | Status: AC
  Filled 2015-05-23: qty 1

## 2015-05-23 MED ORDER — PROPOFOL 500 MG/50ML IV EMUL
INTRAVENOUS | Status: DC | PRN
  Administered 2015-05-23: 75 ug/kg/min via INTRAVENOUS

## 2015-05-23 MED ORDER — PHENYLEPHRINE 40 MCG/ML (10ML) SYRINGE FOR IV PUSH (FOR BLOOD PRESSURE SUPPORT)
PREFILLED_SYRINGE | INTRAVENOUS | Status: AC
  Filled 2015-05-23: qty 10

## 2015-05-23 MED ORDER — PROMETHAZINE HCL 25 MG/ML IJ SOLN: 6.2500 mg | INTRAMUSCULAR | Status: DC | PRN

## 2015-05-23 MED ORDER — LIDOCAINE HCL (PF) 2 % IJ SOLN
INTRAMUSCULAR | Status: DC | PRN
  Administered 2015-05-23: 40 mg via INTRADERMAL

## 2015-05-23 MED ORDER — PROPOFOL 10 MG/ML IV BOLUS
INTRAVENOUS | Status: DC | PRN
  Administered 2015-05-23: 150 mg via INTRAVENOUS

## 2015-05-23 MED ORDER — LIDOCAINE HCL (CARDIAC) 20 MG/ML IV SOLN
INTRAVENOUS | Status: AC
  Filled 2015-05-23: qty 5

## 2015-05-23 MED ORDER — FENTANYL CITRATE (PF) 250 MCG/5ML IJ SOLN
INTRAMUSCULAR | Status: AC
  Filled 2015-05-23: qty 5

## 2015-05-23 MED ORDER — MIDAZOLAM HCL 2 MG/2ML IJ SOLN
INTRAMUSCULAR | Status: AC
  Filled 2015-05-23: qty 2

## 2015-05-23 MED ORDER — ONDANSETRON HCL 4 MG/2ML IJ SOLN
INTRAMUSCULAR | Status: AC
  Filled 2015-05-23: qty 2

## 2015-05-23 MED ORDER — FENTANYL CITRATE (PF) 100 MCG/2ML IJ SOLN: 25.0000 ug | INTRAMUSCULAR | Status: DC | PRN

## 2015-05-23 MED ORDER — PROPOFOL 10 MG/ML IV BOLUS
INTRAVENOUS | Status: AC
  Filled 2015-05-23: qty 20

## 2015-05-23 MED ORDER — SODIUM CHLORIDE 0.9 % IJ SOLN
INTRAMUSCULAR | Status: AC
  Filled 2015-05-23: qty 10

## 2015-05-23 MED ORDER — ROCURONIUM BROMIDE 50 MG/5ML IV SOLN
INTRAVENOUS | Status: AC
  Filled 2015-05-23: qty 1

## 2015-05-23 MED ORDER — DEXAMETHASONE SODIUM PHOSPHATE 4 MG/ML IJ SOLN
INTRAMUSCULAR | Status: DC | PRN
  Administered 2015-05-23: 8 mg via INTRAVENOUS

## 2015-05-23 MED ORDER — ONDANSETRON HCL 4 MG/2ML IJ SOLN
INTRAMUSCULAR | Status: DC | PRN
  Administered 2015-05-23: 4 mg via INTRAVENOUS

## 2015-05-23 SURGICAL SUPPLY — 51 items
APPLIER CLIP 9.375 MED OPEN (MISCELLANEOUS) ×3
BINDER BREAST LRG (GAUZE/BANDAGES/DRESSINGS) IMPLANT
BINDER BREAST XLRG (GAUZE/BANDAGES/DRESSINGS) ×3 IMPLANT
BLADE SURG 15 STRL LF DISP TIS (BLADE) ×1 IMPLANT
BLADE SURG 15 STRL SS (BLADE) ×2
CANISTER SUCTION 2500CC (MISCELLANEOUS) ×3 IMPLANT
CHLORAPREP W/TINT 26ML (MISCELLANEOUS) ×3 IMPLANT
CLIP APPLIE 9.375 MED OPEN (MISCELLANEOUS) ×1 IMPLANT
COVER PROBE W GEL 5X96 (DRAPES) ×3 IMPLANT
COVER SURGICAL LIGHT HANDLE (MISCELLANEOUS) ×3 IMPLANT
DERMABOND ADVANCED (GAUZE/BANDAGES/DRESSINGS) ×2
DERMABOND ADVANCED .7 DNX12 (GAUZE/BANDAGES/DRESSINGS) ×1 IMPLANT
DEVICE DUBIN SPECIMEN MAMMOGRA (MISCELLANEOUS) ×3 IMPLANT
DRAPE CHEST BREAST 15X10 FENES (DRAPES) ×3 IMPLANT
DRAPE PROXIMA HALF (DRAPES) ×3 IMPLANT
DRAPE UTILITY XL STRL (DRAPES) ×3 IMPLANT
DRSG PAD ABDOMINAL 8X10 ST (GAUZE/BANDAGES/DRESSINGS) ×3 IMPLANT
ELECT CAUTERY BLADE 6.4 (BLADE) ×3 IMPLANT
ELECT REM PT RETURN 9FT ADLT (ELECTROSURGICAL) ×3
ELECTRODE REM PT RTRN 9FT ADLT (ELECTROSURGICAL) ×1 IMPLANT
GAUZE SPONGE 4X4 12PLY STRL (GAUZE/BANDAGES/DRESSINGS) ×3 IMPLANT
GLOVE BIO SURGEON STRL SZ7 (GLOVE) ×3 IMPLANT
GLOVE BIOGEL PI IND STRL 6.5 (GLOVE) ×1 IMPLANT
GLOVE BIOGEL PI IND STRL 7.0 (GLOVE) ×1 IMPLANT
GLOVE BIOGEL PI INDICATOR 6.5 (GLOVE) ×2
GLOVE BIOGEL PI INDICATOR 7.0 (GLOVE) ×2
GLOVE EUDERMIC 7 POWDERFREE (GLOVE) ×3 IMPLANT
GLOVE SURG SS PI 6.5 STRL IVOR (GLOVE) ×3 IMPLANT
GLOVE SURG SS PI 7.0 STRL IVOR (GLOVE) ×3 IMPLANT
GOWN STRL REUS W/ TWL LRG LVL3 (GOWN DISPOSABLE) ×1 IMPLANT
GOWN STRL REUS W/ TWL XL LVL3 (GOWN DISPOSABLE) ×1 IMPLANT
GOWN STRL REUS W/TWL LRG LVL3 (GOWN DISPOSABLE) ×2
GOWN STRL REUS W/TWL XL LVL3 (GOWN DISPOSABLE) ×2
KIT BASIN OR (CUSTOM PROCEDURE TRAY) ×3 IMPLANT
KIT MARKER MARGIN INK (KITS) ×3 IMPLANT
NEEDLE HYPO 25X1 1.5 SAFETY (NEEDLE) ×3 IMPLANT
NS IRRIG 1000ML POUR BTL (IV SOLUTION) IMPLANT
PACK SURGICAL SETUP 50X90 (CUSTOM PROCEDURE TRAY) ×3 IMPLANT
PENCIL BUTTON HOLSTER BLD 10FT (ELECTRODE) ×3 IMPLANT
SPONGE LAP 18X18 X RAY DECT (DISPOSABLE) ×3 IMPLANT
SPONGE LAP 4X18 X RAY DECT (DISPOSABLE) ×3 IMPLANT
SUT MNCRL AB 4-0 PS2 18 (SUTURE) ×3 IMPLANT
SUT SILK 2 0 SH (SUTURE) ×3 IMPLANT
SUT VIC AB 3-0 SH 18 (SUTURE) ×3 IMPLANT
SYR BULB 3OZ (MISCELLANEOUS) ×3 IMPLANT
SYR CONTROL 10ML LL (SYRINGE) ×3 IMPLANT
TOWEL OR 17X24 6PK STRL BLUE (TOWEL DISPOSABLE) ×3 IMPLANT
TOWEL OR 17X26 10 PK STRL BLUE (TOWEL DISPOSABLE) ×3 IMPLANT
TUBE CONNECTING 12'X1/4 (SUCTIONS) ×1
TUBE CONNECTING 12X1/4 (SUCTIONS) ×2 IMPLANT
YANKAUER SUCT BULB TIP NO VENT (SUCTIONS) ×3 IMPLANT

## 2015-05-23 NOTE — Interval H&P Note (Signed)
History and Physical Interval Note:  05/23/2015 8:32 AM  [name]  has presented today for surgery, with the diagnosis of RIGHT BREAST CANCER  The various methods of treatment have been discussed with the patient and family. After consideration of risks, benefits and other options for treatment, the patient has consented to  Procedure(s): RIGHT BREAST LUMPECTOMY WITH RADIOACTIVE SEED LOCALIZATION (Right) as a surgical intervention .  The patient's history has been reviewed, patient examined, no change in status, stable for surgery.  I have reviewed the patient's chart and labs.  Questions were answered to the patient's satisfaction.     Adin Hector

## 2015-05-23 NOTE — Anesthesia Postprocedure Evaluation (Signed)
Anesthesia Post Note  Patient: [name]  Procedure(s) Performed: Procedure(s) (LRB): RIGHT BREAST LUMPECTOMY WITH RADIOACTIVE SEED LOCALIZATION (Right)  Patient location during evaluation: PACU Anesthesia Type: General Level of consciousness: awake and alert Pain management: pain level controlled Vital Signs Assessment: post-procedure vital signs reviewed and stable Respiratory status: spontaneous breathing, nonlabored ventilation, respiratory function stable and patient connected to nasal cannula oxygen Cardiovascular status: blood pressure returned to baseline and stable Postop Assessment: no signs of nausea or vomiting Anesthetic complications: no    Last Vitals:  Filed Vitals:   05/23/15 0912 05/23/15 0915  BP: 124/54   Pulse: 86 86  Temp:  36.4 C  Resp: 20 23    Last Pain:  Filed Vitals:   05/23/15 0915  PainSc: 0-No pain                 Effie Berkshire

## 2015-05-23 NOTE — Anesthesia Procedure Notes (Signed)
Procedure Name: LMA Insertion Date/Time: 05/23/2015 7:40 AM Performed by: Carney Living Pre-anesthesia Checklist: Patient identified, Emergency Drugs available, Suction available, Patient being monitored and Timeout performed Patient Re-evaluated:Patient Re-evaluated prior to inductionOxygen Delivery Method: Circle system utilized Preoxygenation: Pre-oxygenation with 100% oxygen Intubation Type: IV induction LMA: LMA inserted LMA Size: 4.0 Number of attempts: 1 Placement Confirmation: positive ETCO2 and breath sounds checked- equal and bilateral Tube secured with: Tape Dental Injury: Teeth and Oropharynx as per pre-operative assessment

## 2015-05-23 NOTE — Transfer of Care (Signed)
Immediate Anesthesia Transfer of Care Note  Patient: [name]  Procedure(s) Performed: Procedure(s): RIGHT BREAST LUMPECTOMY WITH RADIOACTIVE SEED LOCALIZATION (Right)  Patient Location: PACU  Anesthesia Type:General  Level of Consciousness: awake, alert , oriented and patient cooperative  Airway & Oxygen Therapy: Patient Spontanous Breathing and Patient connected to nasal cannula oxygen  Post-op Assessment: Report given to RN, Post -op Vital signs reviewed and stable and Patient moving all extremities X 4  Post vital signs: Reviewed and stable  Last Vitals:  Filed Vitals:   05/23/15 0554  BP: 142/62  Pulse: 83  Temp: 36.6 C  Resp: 16    Complications: No apparent anesthesia complications

## 2015-05-23 NOTE — Discharge Instructions (Signed)
[facility] °Office Phone Number [phone] ° °BREAST BIOPSY/ PARTIAL MASTECTOMY: POST OP INSTRUCTIONS ° °Always review your discharge instruction sheet given to you by the facility where your surgery was performed. ° °IF YOU HAVE DISABILITY OR FAMILY LEAVE FORMS, YOU MUST BRING THEM TO THE OFFICE FOR PROCESSING.  DO NOT GIVE THEM TO YOUR DOCTOR. ° °1. A prescription for pain medication may be given to you upon discharge.  Take your pain medication as prescribed, if needed.  If narcotic pain medicine is not needed, then you may take acetaminophen (Tylenol) or ibuprofen (Advil) as needed. °2. Take your usually prescribed medications unless otherwise directed °3. If you need a refill on your pain medication, please contact your pharmacy.  They will contact our office to request authorization.  Prescriptions will not be filled after 5pm or on week-ends. °4. You should eat very light the first 24 hours after surgery, such as soup, crackers, pudding, etc.  Resume your normal diet the day after surgery. °5. Most patients will experience some swelling and bruising in the breast.  Ice packs and a good support bra will help.  Swelling and bruising can take several days to resolve.  °6. It is common to experience some constipation if taking pain medication after surgery.  Increasing fluid intake and taking a stool softener will usually help or prevent this problem from occurring.  A mild laxative (Milk of Magnesia or Miralax) should be taken according to package directions if there are no bowel movements after 48 hours. °7. Unless discharge instructions indicate otherwise, you may remove your bandages 24-48 hours after surgery, and you may shower at that time.  You may have steri-strips (small skin tapes) in place directly over the incision.  These strips should be left on the skin for 7-10 days.  If your surgeon used skin glue on the incision, you may shower in 24 hours.  The glue will flake off over the  next 2-3 weeks.  Any sutures or staples will be removed at the office during your follow-up visit. °8. ACTIVITIES:  You may resume regular daily activities (gradually increasing) beginning the next day.  Wearing a good support bra or sports bra minimizes pain and swelling.  You may have sexual intercourse when it is comfortable. °a. You may drive when you no longer are taking prescription pain medication, you can comfortably wear a seatbelt, and you can safely maneuver your car and apply brakes. °b. RETURN TO WORK:  ______________________________________________________________________________________ °9. You should see your doctor in the office for a follow-up appointment approximately two weeks after your surgery.  Your doctor’s nurse will typically make your follow-up appointment when she calls you with your pathology report.  Expect your pathology report 2-3 business days after your surgery.  You may call to check if you do not hear from us after three days. °10. OTHER INSTRUCTIONS: _______________________________________________________________________________________________ _____________________________________________________________________________________________________________________________________ °_____________________________________________________________________________________________________________________________________ °_____________________________________________________________________________________________________________________________________ ° °WHEN TO CALL YOUR DOCTOR: °1. Fever over 101.0 °2. Nausea and/or vomiting. °3. Extreme swelling or bruising. °4. Continued bleeding from incision. °5. Increased pain, redness, or drainage from the incision. ° °The clinic staff is available to answer your questions during regular business hours.  Please don’t hesitate to call and ask to speak to one of the nurses for clinical concerns.  If you have a medical emergency, go to the nearest  emergency room or call 911.  A surgeon from [facility] is always on call at the hospital. ° °For further questions, please visit [email or web address]  °

## 2015-05-23 NOTE — Progress Notes (Signed)
cbg 68  Pt diabetes diet controlled . Able to drink but asks for diet ginger ale and eating a few saltines.

## 2015-05-23 NOTE — Op Note (Signed)
Patient Name:           [name]   Date of Surgery:        05/23/2015  Pre op Diagnosis:      Invasive ductal carcinoma right breast, upper outer quadrant, estrogen receptor positive, HER-2 negative, stage IA.  Post op Diagnosis:    Same  Procedure:                 Right partial mastectomy with radioactive seed localization  Surgeon:                     Edsel Petrin. Dalbert Batman, M.D., FACS  Assistant:                      OR staff  Operative Indications:   This is a pleasant 82 year old Caucasian female from [place]. who returns for a second preop visit for surgical planning regarding her right breast cancer.     Her PCP is Dr. Sallee Lange and her oncologist is Dr. [facility].     Recent imaging studies show a 4 mm density in the right breast at the 9 o'clock position, 4 cm from the nipple. Nodes looked normal. They also thought there might be an area of subtle distortion in the outer right breast laterally and they recommended MRI. MGM density category C. Image guided biopsy of the index lesion showed invasive ductal carcinoma, grade 1, ER 100%, PR 0, ki- 67 10%, HER-2 negative. We subsequently performed a MRI which looks good and shows only the single area where the breast cancer is. There are no other enhancing areas of concern. She has seen Dr. Whitney Muse who has indicated that she does not think that she will need chemotherapy but has suggested that she will need both radiation therapy and antiestrogen therapy. Dr. Whitney Muse will make a referral to radiation oncology in [place]..      Comorbidities include history of abdominal hysterectomy, BSO for endometriosis many years ago. Appendectomy. Left breast biopsy for benign disease. Benign hypertension. COPD. Neuropathy of feet. Exploratory laparoscopy.  family history is negative      She will be scheduled for right breast partial mastectomy with radioactive seed localization. Marland Kitchen  Operative  Findings:       The radioactive seed was in the right breast laterally at about the 9:30 position.  I was able to hear the audible signal of the node in the holding area.  The specimen mammogram looked very good with the seed and the biopsy marker clip in the center of the specimen.  Procedure in Detail:          Following the induction of general LMA anesthesia the patient's right breast was prepped and draped in a sterile fashion.  Intravenous antibiotics were given.  Surgical timeout was performed.  0.5% Marcaine with epinephrine was used as a local infiltration anesthetic.    Using the neoprobe I mapped out the location of the seed.  This resulted in a curvilinear circumareolar incision laterally in the right breast.  Dissection was carried down to the breast tissue and around the radioactive signal using the neoprobe as a guide.  The specimen was removed and marked with silk sutures and a 6 color ink kit to orient the pathologist.  Specimen mammogram looked good as described above.  The specimen was marked and sent to the lab.       Hemostasis was excellent.  The  wound was irrigated.  I placed 5 metal clips in the lumpectomy cavity to orient the radiation oncologist.  The breast tissues were reapproximated with numerous interrupted sutures of 3-0 Vicryl and the skin closed with a running subcuticular 4-0 Monocryl and Dermabond.  Breast binder was placed and the patient taken to PACU in stable condition.  EBL 10 mL.  Counts correct.  Complications none.     Edsel Petrin. Dalbert Batman, M.D., FACS General and Minimally Invasive Surgery Breast and Colorectal Surgery  05/23/2015 8:33 AM

## 2015-05-26 ENCOUNTER — Encounter (HOSPITAL_COMMUNITY): Payer: Self-pay | Admitting: General Surgery

## 2015-05-28 LAB — AFB CULTURE WITH SMEAR (NOT AT ARMC)
Acid Fast Culture: POSITIVE — AB
Acid Fast Smear: NEGATIVE

## 2015-05-28 LAB — AFB ID BY DNA PROBE
M TUBERCULOSIS COMPLEX: NEGATIVE
M avium complex: NEGATIVE
M gordonae: POSITIVE — AB
M kansasii: NEGATIVE

## 2015-05-29 NOTE — Progress Notes (Signed)
Quick Note:  Inform patient of Pathology report,. 7 mm tumor with negative margins. No further surgery required. Good news. Be sure she has an appt. With Dr. Whitney Muse at [place] [facility]. She probably already does. thanks ______

## 2015-06-02 ENCOUNTER — Encounter (HOSPITAL_COMMUNITY): Payer: Medicare Other | Attending: Hematology & Oncology | Admitting: Hematology & Oncology

## 2015-06-02 ENCOUNTER — Encounter (HOSPITAL_COMMUNITY): Payer: Self-pay | Admitting: Lab

## 2015-06-02 ENCOUNTER — Encounter (HOSPITAL_COMMUNITY): Payer: Self-pay | Admitting: Hematology & Oncology

## 2015-06-02 VITALS — BP 149/56 | HR 94 | Temp 97.8°F | Resp 18 | Wt 165.9 lb

## 2015-06-02 DIAGNOSIS — M8580 Other specified disorders of bone density and structure, unspecified site: Secondary | ICD-10-CM

## 2015-06-02 DIAGNOSIS — C50811 Malignant neoplasm of overlapping sites of right female breast: Secondary | ICD-10-CM | POA: Diagnosis present

## 2015-06-02 DIAGNOSIS — C50411 Malignant neoplasm of upper-outer quadrant of right female breast: Secondary | ICD-10-CM

## 2015-06-02 NOTE — Progress Notes (Signed)
[name] at [facility] Progress Note  Patient Care Team: Lauren Drown, MD as PCP - General (Family Medicine)  CHIEF COMPLAINTS:  R breast invasive ductal carcinoma, Grade I, ER+ PR(-) HER-2(-), Ki-67 10% Bilateral Screening Mammogram on 04/03/2015 with distortion in R breast Ultrasound Guided R breast Core Needle Biopsy (9 o clock position) 04/22/2015, invasive ductal carcinoma, carcinoma in situ is present Consultation with Dr. Dalbert [name] on 05/01/2015 Bilateral Breast MRI small breast malignancy in the R breast, no other areas of abnormal enhancement to suggest additional foci of malignancy R partial mastectomy with radioactive seed localization Dr. Fanny [name] 05/23/2015 Final Surgical Pathology with Invasive Grade 1 Ductal Carcinoma spanning 0.7 cm, associated DCIS, Lobular Carcinoma in Situ with associated Calcifications pT1b,pNX,M0  HISTORY OF PRESENTING ILLNESS:  [name] 82 y.o. female is here for additional follow-up of her stage I ER+ R breast cancer. She has undergone lumpectomy with Dr. Dalbert [name]. She has no complaints today.  [name] is here alone. The patient is here to discuss further evaluation and discussion of treatment options for recently diagnosed R breast cancer.  She has not met with radiation oncology for consult yet. She would prefer to travel to [place] rather than [place].  Her last bone density scan was performed about 2 years ago. She no longer takes calcium or Vitamin D supplements. She was on fosamax for many years.    She is scheduled for follow up with her surgeon this Friday, 06/06/15.  She denies any issues with her surgery. She did not need to take any of the pain medication prescribed. Denies shortness of breath or arm swelling. However, she notes some swelling in her ankles attributed to not being up as much.   She notes she is ready to get back to all of her previous activities.    MEDICAL HISTORY:  Past Medical  History  Diagnosis Date  . Hypertension   . Hypercholesteremia   . Colon polyps   . PONV (postoperative nausea and vomiting)   . Osteoporosis   . Osteoarthritis   . Lung nodules   . Shortness of breath dyspnea     W/ EXERTION  . COPD (chronic obstructive pulmonary disease) ([place])     [name]  . Diabetes mellitus without complication ([place])     PREDIABETIC  CONTROL W/ DIET  . Cancer [facility])     BREAST CANCER     SURGICAL HISTORY: Past Surgical History  Procedure Laterality Date  . Abdominal hysterectomy    . Appendectomy    . Tonsillectomy    . Colonoscopy    . Bartholin gland cyst excision    . Left inguinal hernia repair    . Multiple toe surgeries    . Colonoscopy  03/25/2011    Procedure: COLONOSCOPY;  Surgeon: Lauren Houston, MD;  Location: [facility];  Service: Endoscopy;  Laterality: N/A;  9:30 am  . Breast surgery      2000  LEFT BREAST  LUMP REMOVED  . Breast lumpectomy with radioactive seed localization Right 05/23/2015    Procedure: RIGHT BREAST LUMPECTOMY WITH RADIOACTIVE SEED LOCALIZATION;  Surgeon: Lauren Skates, MD;  Location: [facility];  Service: General;  Laterality: Right;    SOCIAL HISTORY: Social History   Social History  . Marital Status: Widowed    Spouse Name: N/A  . Number of Children: N/A  . Years of Education: N/A   Occupational History  . Not on file.   Social History  Main Topics  . Smoking status: Former Smoker -- 2.00 packs/day for 50 years    Types: Cigarettes    Quit date: 03/15/1988  . Smokeless tobacco: Never Used  . Alcohol Use: No  . Drug Use: No  . Sexual Activity: Not on file   Other Topics Concern  . Not on file   Social History Narrative  Widowed 0 children She has nieces and nephews Worked as a IT consultant Ex smoker, quit in 1989.   FAMILY HISTORY: Family History  Problem Relation Age of Onset  . Colon cancer Father    has no family status information on file.  Father deceased at 41 yo of colon  cancer that spread to his bone. He was a smoker. Mother deceased at 60 yo of COPD and chronic heart failure. She was a non-smoker. 1 sister who is 73 years younger. No family history of breast cancer that she is aware of.  ALLERGIES:  is allergic to codeine; morphine; and neomycin-bacitracin zn-polymyx.  MEDICATIONS:  Current Outpatient Prescriptions  Medication Sig Dispense Refill  . Bilberry, Vaccinium myrtillus, (BILBERRY PO) Take 1 Dose by mouth daily.     . fluticasone (FLONASE) 50 MCG/ACT nasal spray Place 2 sprays into both nostrils daily. Sometimes uses a second dose qhs    . furosemide (LASIX) 20 MG tablet TAKE (1) TABLET BY MOUTH EACH MORNING. 90 tablet 2  . HYDROcodone-acetaminophen (NORCO) 5-325 MG tablet Take 1-2 tablets by mouth every 6 (six) hours as needed for moderate pain or severe pain. 30 tablet 0  . lisinopril (PRINIVIL,ZESTRIL) 20 MG tablet Take 1 tablet (20 mg total) by mouth daily. 90 tablet 2  . Multiple Vitamins-Minerals (CENTRUM SILVER ADULT 50+ PO) Take by mouth.    . nortriptyline (PAMELOR) 10 MG capsule Take 20 mg by mouth at bedtime. Take 2 at night    . polyethylene glycol (MIRALAX / GLYCOLAX) packet Take 17 g by mouth daily as needed. Constipation    . Tiotropium Bromide Monohydrate (SPIRIVA RESPIMAT) 1.25 MCG/ACT AERS Inhale 2 puffs into the lungs daily. 4 g 5   No current facility-administered medications for this visit.    Review of Systems  Constitutional: Negative.  Negative for fever, chills, weight loss and malaise/fatigue.       Post-menopausal hot flashes  HENT: Negative.  Negative for congestion, hearing loss, nosebleeds, sore throat and tinnitus.   Eyes: Negative.  Negative for blurred vision, double vision, pain and discharge.  Respiratory: Negative.  Negative for cough, hemoptysis, sputum production, shortness of breath and wheezing.   Cardiovascular: Negative.  Negative for chest pain, palpitations, claudication, leg swelling and PND.    Gastrointestinal: Negative.  Negative for heartburn, nausea, vomiting, abdominal pain, diarrhea, constipation, blood in stool and melena.  Genitourinary: Negative.  Negative for dysuria, urgency, frequency and hematuria.  Musculoskeletal: Negative.  Negative for myalgias, joint pain and falls.  Skin: Negative.  Negative for itching and rash.  Neurological: Negative.  Negative for dizziness, tingling, tremors, sensory change, speech change, focal weakness, seizures, loss of consciousness, weakness and headaches.  Endo/Heme/Allergies: Negative.  Does not bruise/bleed easily.  Psychiatric/Behavioral: Negative for depression, suicidal ideas, memory loss and substance abuse. The patient is nervous/anxious. The patient does not have insomnia.        Anxiety related to new breast cancer diagnosis.  All other systems reviewed and are negative.  14 point ROS was done and is otherwise as detailed above or in HPI  PHYSICAL EXAMINATION: ECOG PERFORMANCE STATUS: 0 - Asymptomatic  Filed Vitals:   06/02/15 0900  BP: 149/56  Pulse: 94  Temp: 97.8 F (36.6 C)  Resp: 18   Filed Weights   06/02/15 0900  Weight: 165 lb 14.4 oz (75.252 kg)    Physical Exam  Constitutional: She is oriented to person, place, and time and well-developed, well-nourished, and in no distress.  Appears younger than stated age. Wears glasses.  HENT:  Head: Normocephalic and atraumatic.  Nose: Nose normal.  Mouth/Throat: Oropharynx is clear and moist. No oropharyngeal exudate.  Dentures on top and bottom.  Eyes: Conjunctivae and EOM are normal. Pupils are equal, round, and reactive to light. Right eye exhibits no discharge. Left eye exhibits no discharge. No scleral icterus.  Neck: Normal range of motion. Neck supple. No tracheal deviation present. No thyromegaly present.  Cardiovascular: Normal rate, regular rhythm and normal heart sounds.  Exam reveals no gallop and no friction rub.   No murmur heard. Pulmonary/Chest:  Effort normal and breath sounds normal. She has no wheezes. She has no rales.    Abdominal: Soft. Bowel sounds are normal. She exhibits no distension and no mass. There is no tenderness. There is no rebound and no guarding.  Musculoskeletal: Normal range of motion. She exhibits no edema.  Lymphadenopathy:    She has no cervical adenopathy.  Neurological: She is alert and oriented to person, place, and time. She has normal reflexes. No cranial nerve deficit. Gait normal. Coordination normal.  Skin: Skin is warm and dry. No rash noted.  Psychiatric: Mood, memory, affect and judgment normal.  Nursing note and vitals reviewed.   LABORATORY DATA:  I have reviewed the data as listed Lab Results  Component Value Date   WBC 5.5 05/20/2015   HGB 12.4 05/20/2015   HCT 38.6 05/20/2015   MCV 95.5 05/20/2015   PLT 248 05/20/2015   CMP     Component Value Date/Time   NA 142 05/20/2015 1116   NA 139 04/08/2015 0814   K 4.9 05/20/2015 1116   CL 105 05/20/2015 1116   CO2 25 05/20/2015 1116   GLUCOSE 99 05/20/2015 1116   GLUCOSE 98 04/08/2015 0814   BUN 18 05/20/2015 1116   BUN 17 04/08/2015 0814   CREATININE 1.03* 05/20/2015 1116   CREATININE 0.83 04/03/2014 1556   CALCIUM 10.0 05/20/2015 1116   PROT 6.8 04/08/2015 0814   ALBUMIN 4.5 04/08/2015 0814   AST 28 04/08/2015 0814   ALT 22 04/08/2015 0814   ALKPHOS 68 04/08/2015 0814   BILITOT 0.6 04/08/2015 0814   GFRNONAA 49* 05/20/2015 1116   GFRAA 56* 05/20/2015 1116    PATHOLOGY      RADIOGRAPHIC STUDIES: I have personally reviewed the radiological images as listed and agreed with the findings in the report. Study Result     CLINICAL DATA: Patient recently underwent right breast ultrasound-guided core needle biopsy for AA small mass. This was positive for invasive ductal carcinoma. Current breast MRI is to evaluate for extent of disease.  LABS: Not drawn at time of imaging.  EXAM: BILATERAL BREAST MRI WITH AND  WITHOUT CONTRAST  TECHNIQUE: Multiplanar, multisequence MR images of both breasts were obtained prior to and following the intravenous administration of 14 ml of MultiHance.  THREE-DIMENSIONAL MR IMAGE RENDERING ON INDEPENDENT WORKSTATION:  Three-dimensional MR images were rendered by post-processing of the original MR data on an independent workstation. The three-dimensional MR images were interpreted, and findings are reported in the following complete MRI report for this study. Three dimensional images were  evaluated at the independent DynaCad workstation  COMPARISON: Previous exam(s).  FINDINGS: Breast composition: b. Scattered fibroglandular tissue.  Background parenchymal enhancement: Mild  Right breast: There is clip artifact along the posterior margin of the fibroglandular tissue in the upper outer right breast corresponding to the recent biopsy. There is a small amount of enhancement associated with the biopsy clip consistent with a small breast malignancy and adjacent post biopsy change. There are no other areas of abnormal enhancement within the right breast. Several enhancing normal lymph nodes are noted.  Left breast: No mass or abnormal enhancement.  Lymph nodes: No abnormal appearing lymph nodes.  Ancillary findings: None.  IMPRESSION: 1. Small breast malignancy in the right breast. 2. There are no other areas of abnormal enhancement to suggest additional foci of malignancy. 3. No enlarged or abnormal appearing lymph nodes.  RECOMMENDATION: Treatment as planned for the known right breast malignancy.  BI-RADS CATEGORY 6: Known biopsy-proven malignancy.   Electronically Signed  By: Lajean Manes M.D.  On: 05/07/2015 12:35    ASSESSMENT & PLAN:  R breast invasive ductal carcinoma, Grade I, ER+ PR(-) HER-2(-), Ki-67 10% Bilateral Screening Mammogram on 04/03/2015 with distortion in R breast Ultrasound Guided R breast Core Needle  Biopsy (9 o clock position) 04/22/2015, invasive ductal carcinoma, carcinoma in situ is present Consultation with Dr. Dalbert [name] on 05/01/2015 Bilateral Breast MRI small breast malignancy in the R breast, no other areas of abnormal enhancement to suggest additional foci of malignancy R partial mastectomy with radioactive seed localization Dr. Fanny [name] 05/23/2015 Final Surgical Pathology with Invasive Grade 1 Ductal Carcinoma spanning 0.7 cm, associated DCIS, Lobular Carcinoma in Situ with associated Calcifications PT1b,pNX,M0 Osteopenia on DEXA 08/2013   I have referred the patient for radiation consultation in [place].  She is scheduled for follow up with Dr. Dalbert [name] this Friday, 06/06/15.  Last bone density in 08/2013 showed osteopenia.   We spent time today discussing endocrine therapy in detail. In some of her reading the patient notes she read about "hormones" and was concerned that she would be taking estrogen.  I discussed with her that "pill therapy" is really endocrine therapy that either opposes estrogen or "shuts down" estrogen production.  She was given addition reading information.  She will return for follow up in 6 weeks. In the interim, she will meet with radiation oncology to discuss treatment. At return we will discuss risks/benefits of endocrine therapy, review her bone density and make final treatment plans.  All questions were answered. The patient knows to call the clinic with any problems, questions or concerns.  This document serves as a record of services personally performed by Ancil Linsey, MD. It was created on her behalf by Arlyce Harman, a trained medical scribe. The creation of this record is based on the scribe's personal observations and the provider's statements to them. This document has been checked and approved by the attending provider.  I have reviewed the above documentation for accuracy and completeness, and I agree with the above.  This note was  electronically signed.    Molli Hazard, MD  06/02/2015 10:46 AM

## 2015-06-02 NOTE — Patient Instructions (Addendum)
[place] at [facility] Discharge Instructions  RECOMMENDATIONS MADE BY THE CONSULTANT AND ANY TEST RESULTS WILL BE SENT TO YOUR REFERRING PHYSICIAN.   Exam and discussion by Dr Whitney Muse today Referral to radiation in [place], they will call you with that appt The pills that we would start you on (AIs-aromatase inhibitor) (endocrine therapy) these block hormones, we will talk about the pills the next time you come in  Return to see the doctor in 6 weeks If you go to radiation and they decide to not do radiation please call us, and we can get you in sooner. Please call the clinic if you have any questions or concerns    Thank you for choosing [place] at [facility] to provide your oncology and hematology care.  To afford each patient quality time with our provider, please arrive at least 15 minutes before your scheduled appointment time.   Beginning January 23rd 2017 lab work for the [facility] will be done in the  Main lab at [facility] on 1st floor. If you have a lab appointment with the [facility] please come in thru the  Main Entrance and check in at the main information desk  You need to re-schedule your appointment should you arrive 10 or more minutes late.  We strive to give you quality time with our providers, and arriving late affects you and other patients whose appointments are after yours.  Also, if you no show three or more times for appointments you may be dismissed from the clinic at the providers discretion.     Again, thank you for choosing [facility].  Our hope is that these requests will decrease the amount of time that you wait before being seen by our physicians.       _____________________________________________________________  Should you have questions after your visit to [facility], please contact our office at [phone] between the hours of 8:30 a.m. and 4:30 p.m.  Voicemails left  after 4:30 p.m. will not be returned until the following business day.  For prescription refill requests, have your pharmacy contact our office.         Resources For Cancer Patients and their Caregivers ? American Cancer Society: Can assist with transportation, wigs, general needs, runs Look Good Feel Better.        ([phone] ? Cancer Care: Provides financial assistance, online support groups, medication/co-pay assistance.  1-800-813-HOPE (320) 457-6627) ? [place] Assists [place] cancer patients and their families through emotional , educational and financial support.  639-836-8002 ? [place] DSS Where to apply for food stamps, Medicaid and utility assistance. (989) 162-7760 ? [facility]: Transportation to medical appointments. 820-146-4221 ? Social Security Administration: May apply for disability if have a Stage IV cancer. (725) 389-9593 716-349-0072 ? [employer], Disability and Transit Services: Assists with nutrition, care and transit needs. 8431737273

## 2015-06-02 NOTE — Progress Notes (Signed)
Referral sent to [facility].  Records faxed on 3/20

## 2015-06-13 DIAGNOSIS — M1990 Unspecified osteoarthritis, unspecified site: Secondary | ICD-10-CM | POA: Diagnosis not present

## 2015-06-13 DIAGNOSIS — E119 Type 2 diabetes mellitus without complications: Secondary | ICD-10-CM | POA: Diagnosis not present

## 2015-06-13 DIAGNOSIS — J449 Chronic obstructive pulmonary disease, unspecified: Secondary | ICD-10-CM | POA: Diagnosis not present

## 2015-06-13 DIAGNOSIS — C50411 Malignant neoplasm of upper-outer quadrant of right female breast: Secondary | ICD-10-CM | POA: Diagnosis not present

## 2015-06-13 DIAGNOSIS — Z881 Allergy status to other antibiotic agents status: Secondary | ICD-10-CM | POA: Diagnosis not present

## 2015-06-13 DIAGNOSIS — Z885 Allergy status to narcotic agent status: Secondary | ICD-10-CM | POA: Diagnosis not present

## 2015-06-13 DIAGNOSIS — Z170 Estrogen receptor positive status [ER+]: Secondary | ICD-10-CM | POA: Diagnosis not present

## 2015-06-13 DIAGNOSIS — Z87891 Personal history of nicotine dependence: Secondary | ICD-10-CM | POA: Diagnosis not present

## 2015-06-13 DIAGNOSIS — Z79899 Other long term (current) drug therapy: Secondary | ICD-10-CM | POA: Diagnosis not present

## 2015-06-13 DIAGNOSIS — M810 Age-related osteoporosis without current pathological fracture: Secondary | ICD-10-CM | POA: Diagnosis not present

## 2015-06-13 DIAGNOSIS — E7800 Pure hypercholesterolemia, unspecified: Secondary | ICD-10-CM | POA: Diagnosis not present

## 2015-06-20 DIAGNOSIS — Z510 Encounter for antineoplastic radiation therapy: Secondary | ICD-10-CM | POA: Diagnosis not present

## 2015-06-20 DIAGNOSIS — C50411 Malignant neoplasm of upper-outer quadrant of right female breast: Secondary | ICD-10-CM | POA: Diagnosis not present

## 2015-06-23 DIAGNOSIS — C50411 Malignant neoplasm of upper-outer quadrant of right female breast: Secondary | ICD-10-CM | POA: Diagnosis not present

## 2015-06-23 DIAGNOSIS — Z510 Encounter for antineoplastic radiation therapy: Secondary | ICD-10-CM | POA: Diagnosis not present

## 2015-07-01 DIAGNOSIS — C50411 Malignant neoplasm of upper-outer quadrant of right female breast: Secondary | ICD-10-CM | POA: Diagnosis not present

## 2015-07-01 DIAGNOSIS — Z510 Encounter for antineoplastic radiation therapy: Secondary | ICD-10-CM | POA: Diagnosis not present

## 2015-07-02 DIAGNOSIS — C50411 Malignant neoplasm of upper-outer quadrant of right female breast: Secondary | ICD-10-CM | POA: Diagnosis not present

## 2015-07-02 DIAGNOSIS — Z510 Encounter for antineoplastic radiation therapy: Secondary | ICD-10-CM | POA: Diagnosis not present

## 2015-07-03 DIAGNOSIS — C50411 Malignant neoplasm of upper-outer quadrant of right female breast: Secondary | ICD-10-CM | POA: Diagnosis not present

## 2015-07-03 DIAGNOSIS — Z510 Encounter for antineoplastic radiation therapy: Secondary | ICD-10-CM | POA: Diagnosis not present

## 2015-07-04 DIAGNOSIS — Z510 Encounter for antineoplastic radiation therapy: Secondary | ICD-10-CM | POA: Diagnosis not present

## 2015-07-04 DIAGNOSIS — C50411 Malignant neoplasm of upper-outer quadrant of right female breast: Secondary | ICD-10-CM | POA: Diagnosis not present

## 2015-07-07 DIAGNOSIS — Z510 Encounter for antineoplastic radiation therapy: Secondary | ICD-10-CM | POA: Diagnosis not present

## 2015-07-07 DIAGNOSIS — C50411 Malignant neoplasm of upper-outer quadrant of right female breast: Secondary | ICD-10-CM | POA: Diagnosis not present

## 2015-07-08 DIAGNOSIS — C50411 Malignant neoplasm of upper-outer quadrant of right female breast: Secondary | ICD-10-CM | POA: Diagnosis not present

## 2015-07-08 DIAGNOSIS — Z510 Encounter for antineoplastic radiation therapy: Secondary | ICD-10-CM | POA: Diagnosis not present

## 2015-07-09 DIAGNOSIS — Z510 Encounter for antineoplastic radiation therapy: Secondary | ICD-10-CM | POA: Diagnosis not present

## 2015-07-09 DIAGNOSIS — C50411 Malignant neoplasm of upper-outer quadrant of right female breast: Secondary | ICD-10-CM | POA: Diagnosis not present

## 2015-07-10 DIAGNOSIS — C50411 Malignant neoplasm of upper-outer quadrant of right female breast: Secondary | ICD-10-CM | POA: Diagnosis not present

## 2015-07-10 DIAGNOSIS — Z510 Encounter for antineoplastic radiation therapy: Secondary | ICD-10-CM | POA: Diagnosis not present

## 2015-07-11 DIAGNOSIS — C50411 Malignant neoplasm of upper-outer quadrant of right female breast: Secondary | ICD-10-CM | POA: Diagnosis not present

## 2015-07-11 DIAGNOSIS — Z510 Encounter for antineoplastic radiation therapy: Secondary | ICD-10-CM | POA: Diagnosis not present

## 2015-07-14 ENCOUNTER — Encounter (HOSPITAL_COMMUNITY): Payer: Self-pay | Admitting: Hematology & Oncology

## 2015-07-14 ENCOUNTER — Encounter (HOSPITAL_COMMUNITY): Payer: Medicare Other | Attending: Hematology & Oncology | Admitting: Hematology & Oncology

## 2015-07-14 VITALS — BP 139/60 | HR 88 | Temp 98.3°F | Resp 18 | Wt 166.0 lb

## 2015-07-14 DIAGNOSIS — Z510 Encounter for antineoplastic radiation therapy: Secondary | ICD-10-CM | POA: Diagnosis not present

## 2015-07-14 DIAGNOSIS — Z79899 Other long term (current) drug therapy: Secondary | ICD-10-CM

## 2015-07-14 DIAGNOSIS — C50411 Malignant neoplasm of upper-outer quadrant of right female breast: Secondary | ICD-10-CM | POA: Diagnosis not present

## 2015-07-14 DIAGNOSIS — M8580 Other specified disorders of bone density and structure, unspecified site: Secondary | ICD-10-CM | POA: Diagnosis not present

## 2015-07-14 MED ORDER — ANASTROZOLE 1 MG PO TABS: 1.0000 mg | ORAL_TABLET | Freq: Every day | ORAL | Status: DC

## 2015-07-14 NOTE — Progress Notes (Signed)
[place] at [facility] Progress Note  Patient Care Team: Kathyrn Drown, MD as PCP - General (Family Medicine)  CHIEF COMPLAINTS/PURPOSE OF CONSULTATION:  R breast invasive ductal carcinoma, Grade I, ER+ PR(-) HER-2(-), Ki-67 10% Bilateral Screening Mammogram on 04/03/2015 with distortion in R breast Ultrasound Guided R breast Core Needle Biopsy (9 o clock position) 04/22/2015, invasive ductal carcinoma, carcinoma in situ is present Consultation with Dr. Dalbert Batman on 05/01/2015 Adjuvant XRT  HISTORY OF PRESENTING ILLNESS:  [name] 82 y.o. female is here for additional follow-up of ER+ carcinoma of the R breast. She is currently completing XRT. [name] was here alone today.  She has another week and a half left of radiation treatment. She says that this is going fine and she has had no soreness, burning or peeling. She says that sometimes she gets a little red afterwards but by the next morning it is gone. She went to see Dr. Pablo Ledger on Friday and she told her to get some Aloe vera if she has problems.   Dr. Wolfgang Phoenix told her to stop taking calcium and vitamin D every day because her calcium level was high. She notes that she took Fosamax for years. Currently she is on no medication for "osteoporosis."  She has been working outside in the garden for exercise. She enjoys this a lot.   Is eating okay.  She has no other concerns or questions at this time.   MEDICAL HISTORY:  Past Medical History  Diagnosis Date  . Hypertension   . Hypercholesteremia   . Colon polyps   . PONV (postoperative nausea and vomiting)   . Osteoporosis   . Osteoarthritis   . Lung nodules   . Shortness of breath dyspnea     W/ EXERTION  . COPD (chronic obstructive pulmonary disease) ([place])     DR. [place] Bells  . Diabetes mellitus without complication ([place])     PREDIABETIC  CONTROL W/ DIET  . Cancer [facility])     BREAST CANCER     SURGICAL HISTORY: Past Surgical History    Procedure Laterality Date  . Abdominal hysterectomy    . Appendectomy    . Tonsillectomy    . Colonoscopy    . Bartholin gland cyst excision    . Left inguinal hernia repair    . Multiple toe surgeries    . Colonoscopy  03/25/2011    Procedure: COLONOSCOPY;  Surgeon: Rogene Houston, MD;  [facility];  Service: Endoscopy;  Laterality: N/A;  9:30 am  . Breast surgery      2000  LEFT BREAST  LUMP REMOVED  . Breast lumpectomy with radioactive seed localization Right 05/23/2015    Procedure: RIGHT BREAST LUMPECTOMY WITH RADIOACTIVE SEED LOCALIZATION;  Surgeon: Fanny Skates, MD;  Location: [place];  Service: General;  Laterality: Right;    SOCIAL HISTORY: Social History   Social History  . Marital Status: Widowed    Spouse Name: N/A  . Number of Children: N/A  . Years of Education: N/A   Occupational History  . Not on file.   Social History Main Topics  . Smoking status: Former Smoker -- 2.00 packs/day for 50 years    Types: Cigarettes    Quit date: 03/15/1988  . Smokeless tobacco: Never Used  . Alcohol Use: No  . Drug Use: No  . Sexual Activity: Not on file   Other Topics Concern  . Not on file   Social History Narrative  Widowed 0 children She has nieces and nephews Worked as a IT consultant Ex smoker, quit in 1989.   FAMILY HISTORY: Family History  Problem Relation Age of Onset  . Colon cancer Father    has no family status information on file.  Father deceased at 85 yo of colon cancer that spread to his bone. He was a smoker. Mother deceased at 60 yo of COPD and chronic heart failure. She was a non-smoker. 1 sister who is 69 years younger. No family history of breast cancer that she is aware of.  ALLERGIES:  is allergic to codeine; morphine; and neomycin-bacitracin zn-polymyx.  MEDICATIONS:  Current Outpatient Prescriptions  Medication Sig Dispense Refill  . Bilberry, Vaccinium myrtillus, (BILBERRY PO) Take 1 Dose by mouth daily.     .  fluticasone (FLONASE) 50 MCG/ACT nasal spray Place 2 sprays into both nostrils daily. Sometimes uses a second dose qhs    . furosemide (LASIX) 20 MG tablet TAKE (1) TABLET BY MOUTH EACH MORNING. 90 tablet 2  . HYDROcodone-acetaminophen (NORCO) 5-325 MG tablet Take 1-2 tablets by mouth every 6 (six) hours as needed for moderate pain or severe pain. 30 tablet 0  . lisinopril (PRINIVIL,ZESTRIL) 20 MG tablet Take 1 tablet (20 mg total) by mouth daily. 90 tablet 2  . Multiple Vitamins-Minerals (CENTRUM SILVER ADULT 50+ PO) Take by mouth.    . nortriptyline (PAMELOR) 10 MG capsule Take 20 mg by mouth at bedtime. Take 2 at night    . polyethylene glycol (MIRALAX / GLYCOLAX) packet Take 17 g by mouth daily as needed. Constipation    . Tiotropium Bromide Monohydrate (SPIRIVA RESPIMAT) 1.25 MCG/ACT AERS Inhale 2 puffs into the lungs daily. 4 g 5  . anastrozole (ARIMIDEX) 1 MG tablet Take 1 tablet (1 mg total) by mouth daily. 30 tablet 6   No current facility-administered medications for this visit.    Review of Systems  Constitutional: Negative.  Negative for fever, chills, weight loss and malaise/fatigue.       Post-menopausal hot flashes  HENT: Negative.  Negative for congestion, hearing loss, nosebleeds, sore throat and tinnitus.   Eyes: Negative.  Negative for blurred vision, double vision, pain and discharge.  Respiratory: Negative.  Negative for cough, hemoptysis, sputum production, shortness of breath and wheezing.   Cardiovascular: Negative.  Negative for chest pain, palpitations, claudication, leg swelling and PND.  Gastrointestinal: Negative.  Negative for heartburn, nausea, vomiting, abdominal pain, diarrhea, constipation, blood in stool and melena.  Genitourinary: Negative.  Negative for dysuria, urgency, frequency and hematuria.  Musculoskeletal: Negative.  Negative for myalgias, joint pain and falls.  Skin: Negative.  Negative for itching and rash.  Neurological: Negative.  Negative for  dizziness, tingling, tremors, sensory change, speech change, focal weakness, seizures, loss of consciousness, weakness and headaches.  Endo/Heme/Allergies: Negative.  Does not bruise/bleed easily.  Psychiatric/Behavioral: Negative for depression, suicidal ideas, memory loss and substance abuse. The patient is nervous/anxious. The patient does not have insomnia.        Anxiety related to new breast cancer diagnosis.  All other systems reviewed and are negative.  14 point ROS was done and is otherwise as detailed above or in HPI   PHYSICAL EXAMINATION: ECOG PERFORMANCE STATUS: 0 - Asymptomatic  Filed Vitals:   07/14/15 1000  BP: 139/60  Pulse: 88  Temp: 98.3 F (36.8 C)  Resp: 18   Filed Weights   07/14/15 1000  Weight: 166 lb (75.297 kg)    Physical Exam  Constitutional: She is  oriented to person, place, and time and well-developed, well-nourished, and in no distress.  Appears younger than stated age.  HENT:  Head: Normocephalic and atraumatic.  Nose: Nose normal.  Mouth/Throat: Oropharynx is clear and moist. No oropharyngeal exudate.  Dentures on top and bottom.  Eyes: Conjunctivae and EOM are normal. Pupils are equal, round, and reactive to light. Right eye exhibits no discharge. Left eye exhibits no discharge. No scleral icterus.  Neck: Normal range of motion. Neck supple. No tracheal deviation present. No thyromegaly present.  Cardiovascular: Normal rate, regular rhythm and normal heart sounds.  Exam reveals no gallop and no friction rub.   No murmur heard. Pulmonary/Chest: Effort normal and breath sounds normal. She has no wheezes. She has no rales.  Abdominal: Soft. Bowel sounds are normal. She exhibits no distension and no mass. There is no tenderness. There is no rebound and no guarding.  Musculoskeletal: Normal range of motion. She exhibits no edema.  Lymphadenopathy:    She has no cervical adenopathy.  Neurological: She is alert and oriented to person, place, and  time. She has normal reflexes. No cranial nerve deficit. Gait normal. Coordination normal.  Skin: Skin is warm and dry. No rash noted.  Psychiatric: Mood, memory, affect and judgment normal.  Nursing note and vitals reviewed.   LABORATORY DATA:  I have reviewed the data as listed Lab Results  Component Value Date   WBC 5.5 05/20/2015   HGB 12.4 05/20/2015   HCT 38.6 05/20/2015   MCV 95.5 05/20/2015   PLT 248 05/20/2015   CMP     Component Value Date/Time   NA 142 05/20/2015 1116   NA 139 04/08/2015 0814   K 4.9 05/20/2015 1116   CL 105 05/20/2015 1116   CO2 25 05/20/2015 1116   GLUCOSE 99 05/20/2015 1116   GLUCOSE 98 04/08/2015 0814   BUN 18 05/20/2015 1116   BUN 17 04/08/2015 0814   CREATININE 1.03* 05/20/2015 1116   CREATININE 0.83 04/03/2014 1556   CALCIUM 10.0 05/20/2015 1116   PROT 6.8 04/08/2015 0814   ALBUMIN 4.5 04/08/2015 0814   AST 28 04/08/2015 0814   ALT 22 04/08/2015 0814   ALKPHOS 68 04/08/2015 0814   BILITOT 0.6 04/08/2015 0814   GFRNONAA 49* 05/20/2015 1116   GFRAA 56* 05/20/2015 1116    PATHOLOGY:      RADIOGRAPHIC STUDIES: I have personally reviewed the radiological images as listed and agreed with the findings in the report.  Study Result     CLINICAL DATA: Patient with recent diagnosis right breast carcinoma.  EXAM: MAMMOGRAPHIC GUIDED RADIOACTIVE SEED LOCALIZATION OF THE RIGHT BREAST  COMPARISON: Previous exam(s).  FINDINGS: Patient presents for radioactive seed localization prior to right breast lumpectomy. I met with the patient and we discussed the procedure of seed localization including benefits and alternatives. We discussed the high likelihood of a successful procedure. We discussed the risks of the procedure including infection, bleeding, tissue injury and further surgery. We discussed the low dose of radioactivity involved in the procedure. Informed, written consent was given.  The usual time-out protocol was  performed immediately prior to the procedure.  Using mammographic guidance, sterile technique, 1% lidocaine and an I-125 radioactive seed, heart shaped biopsy marking clip was localized using a lateral approach. The follow-up mammogram images confirm the seed in the expected location and were marked for Dr. Dalbert Batman.  Follow-up survey of the patient confirms presence of the radioactive seed.  Order number of I-125 seed: [id number].  Total activity:  4.259 millicuries Reference Date: 05/12/2015  The patient tolerated the procedure well and was released from the [facility]. She was given instructions regarding seed removal.  IMPRESSION: Radioactive seed localization right breast. No apparent complications.   Electronically Signed  By: Lovey Newcomer M.D.  On: 05/22/2015 15:25   Study Result     CLINICAL DATA: Patient recently underwent right breast ultrasound-guided core needle biopsy for AA small mass. This was positive for invasive ductal carcinoma. Current breast MRI is to evaluate for extent of disease.  LABS: Not drawn at time of imaging.  EXAM: BILATERAL BREAST MRI WITH AND WITHOUT CONTRAST  TECHNIQUE: Multiplanar, multisequence MR images of both breasts were obtained prior to and following the intravenous administration of 14 ml of MultiHance.  THREE-DIMENSIONAL MR IMAGE RENDERING ON INDEPENDENT WORKSTATION:  Three-dimensional MR images were rendered by post-processing of the original MR data on an independent workstation. The three-dimensional MR images were interpreted, and findings are reported in the following complete MRI report for this study. Three dimensional images were evaluated at the independent DynaCad workstation  COMPARISON: Previous exam(s).  FINDINGS: Breast composition: b. Scattered fibroglandular tissue.  Background parenchymal enhancement: Mild  Right breast: There is clip artifact along the posterior margin  of the fibroglandular tissue in the upper outer right breast corresponding to the recent biopsy. There is a small amount of enhancement associated with the biopsy clip consistent with a small breast malignancy and adjacent post biopsy change. There are no other areas of abnormal enhancement within the right breast. Several enhancing normal lymph nodes are noted.  Left breast: No mass or abnormal enhancement.  Lymph nodes: No abnormal appearing lymph nodes.  Ancillary findings: None.  IMPRESSION: 1. Small breast malignancy in the right breast. 2. There are no other areas of abnormal enhancement to suggest additional foci of malignancy. 3. No enlarged or abnormal appearing lymph nodes.  RECOMMENDATION: Treatment as planned for the known right breast malignancy.  BI-RADS CATEGORY 6: Known biopsy-proven malignancy.   Electronically Signed  By: Lajean Manes M.D.  On: 05/07/2015 12:35   ADDENDUM REPORT: 04/24/2015 14:56 ADDENDUM: Pathology of the breast, right, needle core biopsy, 9 o'clock revealed INVASIVE DUCTAL CARCINOMA, GRADE 1. CARCINOMA IN SITU IS PRESENT. This was found to be concordant by Dr. Shelly Bombard. Recommendations: Recommend breast MRI given additional vague shadowing area in outer right breast and DCIS component. Recommendations were relayed to Dr. Lance Sell nurse on 04/24/15 at 11:50 AM by Jetta Lout, [facility] Rad[facility]). She asked that when the radiologist gives the results to the patient, please tell the patient to contact their office. Dr. Wolfgang Phoenix will review pathology report and recommendations and make decision regarding MRI and surgical referral. Results and instructions to call Dr. Lance Sell office were relayed to the patient by Dr. Enriqueta Shutter. She stated she has done well following the biopsy. Electronically Signed  By: Everlean Alstrom M.D.  On: 04/24/2015 14:56     Study Result     CLINICAL DATA: 82 year old female with a mass/  an area of distortion in the outer right breast peer  EXAM: ULTRASOUND GUIDED RIGHT BREAST CORE NEEDLE BIOPSY  COMPARISON: Previous exam(s).  PROCEDURE: I met with the patient and we discussed the procedure of ultrasound-guided biopsy, including benefits and alternatives. We discussed the high likelihood of a successful procedure. We discussed the risks of the procedure including infection, bleeding, tissue injury, clip migration, and inadequate sampling. Informed written consent was given. The usual time-out protocol was performed immediately prior to the procedure.  Using  sterile technique and 1% Lidocaine as local anesthetic, under direct ultrasound visualization, a 12 gauge vacuum-assisted device was used to perform biopsy of the mass/ distortion in the outer right breast at the approximate 9 o'clock positionusing a lateral to medial approach. At the conclusion of the procedure, a wing shaped tissue marker clip was deployed into the biopsy cavity. Follow-up 2-view mammogram was performed and dictated separately.  IMPRESSION: Ultrasound-guided biopsy of a mass/area of distortion in the outer right breast.  Electronically Signed: By: Everlean Alstrom M.D. On: 04/22/2015 16:59   Study Result     CLINICAL DATA: Screening recall for possible right breast distortion.  EXAM: DIGITAL DIAGNOSTIC RIGHT MAMMOGRAM WITH 3D TOMOSYNTHESIS AND CAD  RIGHT BREAST ULTRASOUND  COMPARISON: Previous exam(s).  ACR Breast Density Category b: There are scattered areas of fibroglandular density.  FINDINGS: Spot compression CC and MLO tomosynthesis was performed of the outer right breast demonstrating a persistent area of subtle distortion.  Mammographic images were processed with CAD.  Physical examination of the outer right breast reveals focal pain at the approximate 9 o'clock position 4 cm from the nipple, with mild underlying thickening.  Targeted ultrasound  of the right breast was performed demonstrating a small shadowing mass at 9 o'clock 4 cm from the nipple measuring 0.4 x 0.4 by 0.4 cm. This is felt to correspond with mammography findings. No definite lymphadenopathy seen in the right axilla.  IMPRESSION: Suspicious right breast mass.  RECOMMENDATION: Ultrasound-guided biopsy of the mass in the outer right breast is recommended. This will subsequently be performed and dictated separately.  I have discussed the findings and recommendations with the patient. Results were also provided in writing at the conclusion of the visit. If applicable, a reminder letter will be sent to the patient regarding the next appointment.  BI-RADS CATEGORY 4: Suspicious.   Electronically Signed  By: Everlean Alstrom M.D.  On: 04/22/2015 12:36    ASSESSMENT & PLAN:  R breast invasive ductal carcinoma, Grade I, ER+ PR(-) HER-2(-), Ki-67 10%, upper outer quadrant Bilateral Screening Mammogram on 04/03/2015 with distortion in R breast Ultrasound Guided R breast Core Needle Biopsy (9 o clock position) 04/22/2015, invasive ductal carcinoma, carcinoma in situ is present Consultation with Dr. Dalbert Batman on 05/01/2015 R partial mastectomy with radioactive seed localization 05/23/2015 Dr. Dalbert Batman Adjuvant XRT  She has a week and a half of radiation left with Dr. Pablo Ledger. She is doing extremely well. We again reviewed endocrine therapy and she is interested in pursuing this. Given her history of osteoporosis, I would like a DEXA last one was in 08/2013. We briefly addressed some of the risks of AI therapy including bone loss, joint pain. We also discussed the benefits of therapy. We discussed tamoxifen as well.   I will write her a prescription for Arimidex. She should start taking this after her radiation is done in about 3-4 weeks.   We did discuss prolia if she chooses to stay on AI therapy and still has underlying osteopenia/osteoporosis. We will see how  she feels on arimidex and address at follow-up. Could also consider tamoxifen.   She will return in 2 months for a follow up.   All questions were answered. The patient knows to call the clinic with any problems, questions or concerns.  This document serves as a record of services personally performed by Ancil Linsey, MD. It was created on her behalf by Kandace Blitz, a trained medical scribe. The creation of this record is based on the scribe's personal  observations and the provider's statements to them. This document has been checked and approved by the attending provider.  I have reviewed the above documentation for accuracy and completeness, and I agree with the above.  This note was electronically signed.    Molli Hazard, MD  07/14/2015 11:36 AM

## 2015-07-14 NOTE — Patient Instructions (Addendum)
[place] at [facility] Discharge Instructions  RECOMMENDATIONS MADE BY THE CONSULTANT AND ANY TEST RESULTS WILL BE SENT TO YOUR REFERRING PHYSICIAN.   Exam and discussion by Dr Whitney Muse today arimidex (do not start for 4 weeks) once a day for probably 5 years  Bone density scheduled  Return to see the doctor in 2 months Please call the clinic if you have any questions or concerns     Thank you for choosing [place] at [facility] to provide your oncology and hematology care.  To afford each patient quality time with our provider, please arrive at least 15 minutes before your scheduled appointment time.   Beginning January 23rd 2017 lab work for the [facility] will be done in the  Main lab at [facility] on 1st floor. If you have a lab appointment with the [place] please come in thru the  Main Entrance and check in at the main information desk  You need to re-schedule your appointment should you arrive 10 or more minutes late.  We strive to give you quality time with our providers, and arriving late affects you and other patients whose appointments are after yours.  Also, if you no show three or more times for appointments you may be dismissed from the clinic at the providers discretion.     Again, thank you for choosing [facility].  Our hope is that these requests will decrease the amount of time that you wait before being seen by our physicians.       _____________________________________________________________  Should you have questions after your visit to [facility], please contact our office at [phone] between the hours of 8:30 a.m. and 4:30 p.m.  Voicemails left after 4:30 p.m. will not be returned until the following business day.  For prescription refill requests, have your pharmacy contact our office.         Resources For Cancer Patients and their Caregivers ? American Cancer  Society: Can assist with transportation, wigs, general needs, runs Look Good Feel Better.        (878)878-9546 ? Cancer Care: Provides financial assistance, online support groups, medication/co-pay assistance.  1-800-813-HOPE (912)380-1207) ? [facility] Assists [place] cancer patients and their families through emotional , educational and financial support.  3236364629 ? [place] DSS Where to apply for food stamps, Medicaid and utility assistance. 262 031 8090 ? [facility]: Transportation to medical appointments. (224)362-0644 ? Social Security Administration: May apply for disability if have a Stage IV cancer. 563-654-9771 539-008-9688 ? [employer], Disability and Transit Services: Assists with nutrition, care and transit needs. (912)244-6854

## 2015-07-15 DIAGNOSIS — C50411 Malignant neoplasm of upper-outer quadrant of right female breast: Secondary | ICD-10-CM | POA: Diagnosis not present

## 2015-07-15 DIAGNOSIS — Z510 Encounter for antineoplastic radiation therapy: Secondary | ICD-10-CM | POA: Diagnosis not present

## 2015-07-16 DIAGNOSIS — Z510 Encounter for antineoplastic radiation therapy: Secondary | ICD-10-CM | POA: Diagnosis not present

## 2015-07-16 DIAGNOSIS — C50411 Malignant neoplasm of upper-outer quadrant of right female breast: Secondary | ICD-10-CM | POA: Diagnosis not present

## 2015-07-17 DIAGNOSIS — Z510 Encounter for antineoplastic radiation therapy: Secondary | ICD-10-CM | POA: Diagnosis not present

## 2015-07-17 DIAGNOSIS — C50411 Malignant neoplasm of upper-outer quadrant of right female breast: Secondary | ICD-10-CM | POA: Diagnosis not present

## 2015-07-18 DIAGNOSIS — C50411 Malignant neoplasm of upper-outer quadrant of right female breast: Secondary | ICD-10-CM | POA: Diagnosis not present

## 2015-07-18 DIAGNOSIS — Z510 Encounter for antineoplastic radiation therapy: Secondary | ICD-10-CM | POA: Diagnosis not present

## 2015-07-21 DIAGNOSIS — C50411 Malignant neoplasm of upper-outer quadrant of right female breast: Secondary | ICD-10-CM | POA: Diagnosis not present

## 2015-07-21 DIAGNOSIS — Z510 Encounter for antineoplastic radiation therapy: Secondary | ICD-10-CM | POA: Diagnosis not present

## 2015-07-22 DIAGNOSIS — Z510 Encounter for antineoplastic radiation therapy: Secondary | ICD-10-CM | POA: Diagnosis not present

## 2015-07-22 DIAGNOSIS — C50411 Malignant neoplasm of upper-outer quadrant of right female breast: Secondary | ICD-10-CM | POA: Diagnosis not present

## 2015-07-23 DIAGNOSIS — C50411 Malignant neoplasm of upper-outer quadrant of right female breast: Secondary | ICD-10-CM | POA: Diagnosis not present

## 2015-07-23 DIAGNOSIS — Z510 Encounter for antineoplastic radiation therapy: Secondary | ICD-10-CM | POA: Diagnosis not present

## 2015-07-31 DIAGNOSIS — L821 Other seborrheic keratosis: Secondary | ICD-10-CM | POA: Diagnosis not present

## 2015-07-31 DIAGNOSIS — B351 Tinea unguium: Secondary | ICD-10-CM | POA: Diagnosis not present

## 2015-07-31 DIAGNOSIS — R262 Difficulty in walking, not elsewhere classified: Secondary | ICD-10-CM | POA: Diagnosis not present

## 2015-07-31 DIAGNOSIS — M170 Bilateral primary osteoarthritis of knee: Secondary | ICD-10-CM | POA: Diagnosis not present

## 2015-07-31 DIAGNOSIS — L820 Inflamed seborrheic keratosis: Secondary | ICD-10-CM | POA: Diagnosis not present

## 2015-07-31 DIAGNOSIS — D225 Melanocytic nevi of trunk: Secondary | ICD-10-CM | POA: Diagnosis not present

## 2015-07-31 DIAGNOSIS — L249 Irritant contact dermatitis, unspecified cause: Secondary | ICD-10-CM | POA: Diagnosis not present

## 2015-07-31 DIAGNOSIS — M25562 Pain in left knee: Secondary | ICD-10-CM | POA: Diagnosis not present

## 2015-07-31 DIAGNOSIS — M25561 Pain in right knee: Secondary | ICD-10-CM | POA: Diagnosis not present

## 2015-08-01 ENCOUNTER — Ambulatory Visit (HOSPITAL_COMMUNITY)
Admission: RE | Admit: 2015-08-01 | Discharge: 2015-08-01 | Disposition: A | Discharge status: Home / Self Care | Payer: Medicare Other | Source: Ambulatory Visit | Attending: Hematology & Oncology | Admitting: Hematology & Oncology

## 2015-08-01 DIAGNOSIS — M8580 Other specified disorders of bone density and structure, unspecified site: Secondary | ICD-10-CM

## 2015-08-01 DIAGNOSIS — M85852 Other specified disorders of bone density and structure, left thigh: Secondary | ICD-10-CM | POA: Diagnosis not present

## 2015-08-01 DIAGNOSIS — Z79899 Other long term (current) drug therapy: Secondary | ICD-10-CM

## 2015-08-01 DIAGNOSIS — C50411 Malignant neoplasm of upper-outer quadrant of right female breast: Secondary | ICD-10-CM

## 2015-08-01 DIAGNOSIS — Z780 Asymptomatic menopausal state: Secondary | ICD-10-CM | POA: Diagnosis not present

## 2015-08-05 DIAGNOSIS — M1711 Unilateral primary osteoarthritis, right knee: Secondary | ICD-10-CM | POA: Diagnosis not present

## 2015-08-05 DIAGNOSIS — M25561 Pain in right knee: Secondary | ICD-10-CM | POA: Diagnosis not present

## 2015-08-07 DIAGNOSIS — M170 Bilateral primary osteoarthritis of knee: Secondary | ICD-10-CM | POA: Diagnosis not present

## 2015-08-07 DIAGNOSIS — M25562 Pain in left knee: Secondary | ICD-10-CM | POA: Diagnosis not present

## 2015-08-07 DIAGNOSIS — R2689 Other abnormalities of gait and mobility: Secondary | ICD-10-CM | POA: Diagnosis not present

## 2015-08-07 DIAGNOSIS — M25561 Pain in right knee: Secondary | ICD-10-CM | POA: Diagnosis not present

## 2015-08-07 DIAGNOSIS — M1712 Unilateral primary osteoarthritis, left knee: Secondary | ICD-10-CM | POA: Diagnosis not present

## 2015-08-12 DIAGNOSIS — M1711 Unilateral primary osteoarthritis, right knee: Secondary | ICD-10-CM | POA: Diagnosis not present

## 2015-08-12 DIAGNOSIS — R2689 Other abnormalities of gait and mobility: Secondary | ICD-10-CM | POA: Diagnosis not present

## 2015-08-12 DIAGNOSIS — M25562 Pain in left knee: Secondary | ICD-10-CM | POA: Diagnosis not present

## 2015-08-12 DIAGNOSIS — M170 Bilateral primary osteoarthritis of knee: Secondary | ICD-10-CM | POA: Diagnosis not present

## 2015-08-12 DIAGNOSIS — M25561 Pain in right knee: Secondary | ICD-10-CM | POA: Diagnosis not present

## 2015-08-14 DIAGNOSIS — M25561 Pain in right knee: Secondary | ICD-10-CM | POA: Diagnosis not present

## 2015-08-14 DIAGNOSIS — R2689 Other abnormalities of gait and mobility: Secondary | ICD-10-CM | POA: Diagnosis not present

## 2015-08-14 DIAGNOSIS — M25562 Pain in left knee: Secondary | ICD-10-CM | POA: Diagnosis not present

## 2015-08-14 DIAGNOSIS — M1712 Unilateral primary osteoarthritis, left knee: Secondary | ICD-10-CM | POA: Diagnosis not present

## 2015-08-14 DIAGNOSIS — M170 Bilateral primary osteoarthritis of knee: Secondary | ICD-10-CM | POA: Diagnosis not present

## 2015-08-18 ENCOUNTER — Other Ambulatory Visit: Payer: Self-pay | Admitting: Family Medicine

## 2015-08-18 ENCOUNTER — Other Ambulatory Visit: Payer: Self-pay | Admitting: Pulmonary Disease

## 2015-08-19 DIAGNOSIS — M25562 Pain in left knee: Secondary | ICD-10-CM | POA: Diagnosis not present

## 2015-08-19 DIAGNOSIS — R2689 Other abnormalities of gait and mobility: Secondary | ICD-10-CM | POA: Diagnosis not present

## 2015-08-19 DIAGNOSIS — M170 Bilateral primary osteoarthritis of knee: Secondary | ICD-10-CM | POA: Diagnosis not present

## 2015-08-19 DIAGNOSIS — R262 Difficulty in walking, not elsewhere classified: Secondary | ICD-10-CM | POA: Diagnosis not present

## 2015-08-19 DIAGNOSIS — M1711 Unilateral primary osteoarthritis, right knee: Secondary | ICD-10-CM | POA: Diagnosis not present

## 2015-08-19 DIAGNOSIS — M25561 Pain in right knee: Secondary | ICD-10-CM | POA: Diagnosis not present

## 2015-08-20 DIAGNOSIS — M1712 Unilateral primary osteoarthritis, left knee: Secondary | ICD-10-CM | POA: Diagnosis not present

## 2015-08-20 DIAGNOSIS — M170 Bilateral primary osteoarthritis of knee: Secondary | ICD-10-CM | POA: Diagnosis not present

## 2015-08-20 DIAGNOSIS — M25562 Pain in left knee: Secondary | ICD-10-CM | POA: Diagnosis not present

## 2015-08-20 DIAGNOSIS — R2689 Other abnormalities of gait and mobility: Secondary | ICD-10-CM | POA: Diagnosis not present

## 2015-08-20 DIAGNOSIS — M25561 Pain in right knee: Secondary | ICD-10-CM | POA: Diagnosis not present

## 2015-08-26 DIAGNOSIS — M25561 Pain in right knee: Secondary | ICD-10-CM | POA: Diagnosis not present

## 2015-08-26 DIAGNOSIS — M25562 Pain in left knee: Secondary | ICD-10-CM | POA: Diagnosis not present

## 2015-08-26 DIAGNOSIS — M170 Bilateral primary osteoarthritis of knee: Secondary | ICD-10-CM | POA: Diagnosis not present

## 2015-09-05 DIAGNOSIS — S20361A Insect bite (nonvenomous) of right front wall of thorax, initial encounter: Secondary | ICD-10-CM | POA: Diagnosis not present

## 2015-09-05 DIAGNOSIS — C50411 Malignant neoplasm of upper-outer quadrant of right female breast: Secondary | ICD-10-CM | POA: Diagnosis not present

## 2015-09-05 DIAGNOSIS — Z923 Personal history of irradiation: Secondary | ICD-10-CM | POA: Diagnosis not present

## 2015-09-19 ENCOUNTER — Encounter (HOSPITAL_COMMUNITY): Payer: Medicare Other | Attending: Hematology & Oncology | Admitting: Hematology & Oncology

## 2015-09-19 ENCOUNTER — Encounter (HOSPITAL_COMMUNITY): Payer: Self-pay | Admitting: Hematology & Oncology

## 2015-09-19 VITALS — BP 132/65 | HR 80 | Temp 98.1°F | Resp 18 | Wt 167.8 lb

## 2015-09-19 DIAGNOSIS — M8580 Other specified disorders of bone density and structure, unspecified site: Secondary | ICD-10-CM | POA: Diagnosis not present

## 2015-09-19 DIAGNOSIS — Z923 Personal history of irradiation: Secondary | ICD-10-CM | POA: Diagnosis not present

## 2015-09-19 DIAGNOSIS — C50411 Malignant neoplasm of upper-outer quadrant of right female breast: Secondary | ICD-10-CM | POA: Diagnosis not present

## 2015-09-19 DIAGNOSIS — Z79899 Other long term (current) drug therapy: Secondary | ICD-10-CM

## 2015-09-19 DIAGNOSIS — S20161D Insect bite (nonvenomous) of breast, right breast, subsequent encounter: Secondary | ICD-10-CM | POA: Diagnosis not present

## 2015-09-19 MED ORDER — ONDANSETRON HCL 8 MG PO TABS: 8.0000 mg | ORAL_TABLET | Freq: Three times a day (TID) | ORAL | Status: DC | PRN

## 2015-09-19 NOTE — Progress Notes (Signed)
[place] at [facility] Progress Note  Patient Care Team: Kathyrn Drown, MD as PCP - General (Family Medicine)  CHIEF COMPLAINTS/PURPOSE OF CONSULTATION:  R breast invasive ductal carcinoma, Grade I, ER+ PR(-) HER-2(-), Ki-67 10% Bilateral Screening Mammogram on 04/03/2015 with distortion in R breast Ultrasound Guided R breast Core Needle Biopsy (9 o clock position) 04/22/2015, invasive ductal carcinoma, carcinoma in situ is present Consultation with Dr. Dalbert Batman on 05/01/2015 Adjuvant XRT  HISTORY OF PRESENTING ILLNESS:  [name] 82 y.o. female is here for additional follow-up of ER+ carcinoma of the R breast. She has completed XRT.   [name] returns to the [place] today unaccompanied.  She is done with radiation. She notes that the Arimidex has been a little rough. She says she's still having hot flashes, but she can handle that; the real issue is the nausea that she's been having on it. She says she didn't want to call in and let us know up here because she just wanted to fight it as much as she could. She notes that she also had a tick which was removed.  She notes that the hot flashes sometimes wake her up at night, but she's used to this, so it doesn't bother her.  She's on an antibiotic, Keflex, to treat her "fever on the tick bite," which she also notes could be due to her surgery, radiation, or her tick bite. She worries if the antibiotic will make her nauseated. She notes that antibiotics have always bothered her and she hates to turn around and take something else that will nauseate her. She confirms that she likes yogurt and was advised to eat yogurt while she takes the antibiotic.  She denies still having nausea with the Arimidex, saying that it went away "when the weather broke." She also notes that she supposes she was still learning what to eat and what not to eat while taking it.  In general, she says she is eating okay now; "too well."  She says she's trying to cook.  She was reminded several times to let us know if anything worsens or doesn't get better, and to stay in communication. She denies worsening joint pain.  MEDICAL HISTORY:  Past Medical History  Diagnosis Date  . Hypertension   . Hypercholesteremia   . Colon polyps   . PONV (postoperative nausea and vomiting)   . Osteoporosis   . Osteoarthritis   . Lung nodules   . Shortness of breath dyspnea     W/ EXERTION  . COPD (chronic obstructive pulmonary disease) ([place])     DR. Lake Bells  . Diabetes mellitus without complication ([place])     PREDIABETIC  CONTROL W/ DIET  . Cancer [facility])     BREAST CANCER     SURGICAL HISTORY: Past Surgical History  Procedure Laterality Date  . Abdominal hysterectomy    . Appendectomy    . Tonsillectomy    . Colonoscopy    . Bartholin gland cyst excision    . Left inguinal hernia repair    . Multiple toe surgeries    . Colonoscopy  03/25/2011    Procedure: COLONOSCOPY;  Surgeon: Rogene Houston, MD;  Location: [facility] SU[facility];  Service: Endoscopy;  Laterality: N/A;  9:30 am  . Breast surgery      2000  LEFT BREAST  LUMP REMOVED  . Breast lumpectomy with radioactive seed localization Right 05/23/2015    Procedure: RIGHT BREAST LUMPECTOMY WITH RADIOACTIVE SEED  LOCALIZATION;  Surgeon: Claud Kelp, MD;  Location: [facility] OR;  Service: General;  Laterality: Right;    SOCIAL HISTORY: Social History   Social History  . Marital Status: Widowed    Spouse Name: N/A  . Number of Children: N/A  . Years of Education: N/A   Occupational History  . Not on file.   Social History Main Topics  . Smoking status: Former Smoker -- 2.00 packs/day for 50 years    Types: Cigarettes    Quit date: 03/15/1988  . Smokeless tobacco: Never Used  . Alcohol Use: No  . Drug Use: No  . Sexual Activity: Not on file   Other Topics Concern  . Not on file   Social History Narrative  Widowed 0 children She has nieces and nephews Worked as a  Education officer, community Ex smoker, quit in 1989.   FAMILY HISTORY: Family History  Problem Relation Age of Onset  . Colon cancer Father    has no family status information on file.  Father deceased at 42 yo of colon cancer that spread to his bone. He was a smoker. Mother deceased at 65 yo of COPD and chronic heart failure. She was a non-smoker. 1 sister who is 17 years younger. No family history of breast cancer that she is aware of.  ALLERGIES:  is allergic to codeine; morphine; and neomycin-bacitracin zn-polymyx.  MEDICATIONS:  Current Outpatient Prescriptions  Medication Sig Dispense Refill  . anastrozole (ARIMIDEX) 1 MG tablet Take 1 tablet (1 mg total) by mouth daily. 30 tablet 6  . Bilberry, Vaccinium myrtillus, (BILBERRY PO) Take 1 Dose by mouth daily.     . fluticasone (FLONASE) 50 MCG/ACT nasal spray Place 2 sprays into both nostrils daily. Sometimes uses a second dose qhs    . furosemide (LASIX) 20 MG tablet TAKE (1) TABLET BY MOUTH EACH MORNING. 90 tablet 1  . lisinopril (PRINIVIL,ZESTRIL) 20 MG tablet Take 1 tablet (20 mg total) by mouth daily. 90 tablet 2  . Multiple Vitamins-Minerals (CENTRUM SILVER ADULT 50+ PO) Take by mouth.    . nortriptyline (PAMELOR) 10 MG capsule Take 20 mg by mouth at bedtime. Take 2 at night    . polyethylene glycol (MIRALAX / GLYCOLAX) packet Take 17 g by mouth daily as needed. Constipation    . SPIRIVA RESPIMAT 2.5 MCG/ACT AERS INHALE 2 PUFFS EACH DAY. 4 g 5  . HYDROcodone-acetaminophen (NORCO) 5-325 MG tablet Take 1-2 tablets by mouth every 6 (six) hours as needed for moderate pain or severe pain. (Patient not taking: Reported on 09/19/2015) 30 tablet 0  . Tiotropium Bromide Monohydrate (SPIRIVA RESPIMAT) 1.25 MCG/ACT AERS Inhale 2 puffs into the lungs daily. (Patient not taking: Reported on 09/19/2015) 4 g 5   No current facility-administered medications for this visit.    Review of Systems  Constitutional: Negative.  Negative for fever, chills,  weight loss and malaise/fatigue.       Post-menopausal hot flashes  HENT: Negative.  Negative for congestion, hearing loss, nosebleeds, sore throat and tinnitus.   Eyes: Negative.  Negative for blurred vision, double vision, pain and discharge.  Respiratory: Negative.  Negative for cough, hemoptysis, sputum production, shortness of breath and wheezing.   Cardiovascular: Negative.  Negative for chest pain, palpitations, claudication, leg swelling and PND.  Gastrointestinal: Negative.  Negative for heartburn, nausea, vomiting, abdominal pain, diarrhea, constipation, blood in stool and melena.  Genitourinary: Negative.  Negative for dysuria, urgency, frequency and hematuria.  Musculoskeletal: Negative.  Negative for myalgias, joint pain and  falls.  Skin: Negative.  Negative for itching and rash.  Neurological: Negative.  Negative for dizziness, tingling, tremors, sensory change, speech change, focal weakness, seizures, loss of consciousness, weakness and headaches.  Endo/Heme/Allergies: Negative.  Does not bruise/bleed easily.  Psychiatric/Behavioral: Negative for depression, memory loss, substance abuse and suicidal ideas. The patient is nervous/anxious. The patient does not have insomnia.   All other systems reviewed and are negative.  14 point ROS was done and is otherwise as detailed above or in HPI    PHYSICAL EXAMINATION: ECOG PERFORMANCE STATUS: 0 - Asymptomatic  Filed Vitals:   09/19/15 1100  BP: 132/65  Pulse: 80  Temp: 98.1 F (36.7 C)  Resp: 18   Filed Weights   09/19/15 1100  Weight: 167 lb 12.8 oz (76.114 kg)    Physical Exam  Constitutional: She is oriented to person, place, and time and well-developed, well-nourished, and in no distress.  Appears younger than stated age.  HENT:  Head: Normocephalic and atraumatic.  Nose: Nose normal.  Mouth/Throat: Oropharynx is clear and moist. No oropharyngeal exudate.  Dentures on top and bottom.  Eyes: Conjunctivae and EOM  are normal. Pupils are equal, round, and reactive to light. Right eye exhibits no discharge. Left eye exhibits no discharge. No scleral icterus.  Neck: Normal range of motion. Neck supple. No tracheal deviation present. No thyromegaly present.  Cardiovascular: Normal rate, regular rhythm and normal heart sounds.  Exam reveals no gallop and no friction rub.   No murmur heard. Pulmonary/Chest: Effort normal and breath sounds normal. She has no wheezes. She has no rales.  Abdominal: Soft. Bowel sounds are normal. She exhibits no distension and no mass. There is no tenderness. There is no rebound and no guarding.  Musculoskeletal: Normal range of motion. She exhibits no edema.  Lymphadenopathy:    She has no cervical adenopathy.  Neurological: She is alert and oriented to person, place, and time. She has normal reflexes. No cranial nerve deficit. Gait normal. Coordination normal.  Skin: Skin is warm and dry. No rash noted.  Psychiatric: Mood, memory, affect and judgment normal.  Nursing note and vitals reviewed.   LABORATORY DATA:  I have reviewed the data as listed Lab Results  Component Value Date   WBC 5.5 05/20/2015   HGB 12.4 05/20/2015   HCT 38.6 05/20/2015   MCV 95.5 05/20/2015   PLT 248 05/20/2015   CMP     Component Value Date/Time   NA 142 05/20/2015 1116   NA 139 04/08/2015 0814   K 4.9 05/20/2015 1116   CL 105 05/20/2015 1116   CO2 25 05/20/2015 1116   GLUCOSE 99 05/20/2015 1116   GLUCOSE 98 04/08/2015 0814   BUN 18 05/20/2015 1116   BUN 17 04/08/2015 0814   CREATININE 1.03* 05/20/2015 1116   CREATININE 0.83 04/03/2014 1556   CALCIUM 10.0 05/20/2015 1116   PROT 6.8 04/08/2015 0814   ALBUMIN 4.5 04/08/2015 0814   AST 28 04/08/2015 0814   ALT 22 04/08/2015 0814   ALKPHOS 68 04/08/2015 0814   BILITOT 0.6 04/08/2015 0814   GFRNONAA 49* 05/20/2015 1116   GFRAA 56* 05/20/2015 1116    PATHOLOGY:      RADIOGRAPHIC STUDIES: I have personally reviewed the  radiological images as listed and agreed with the findings in the report.  Study Result     CLINICAL DATA: Patient with recent diagnosis right breast carcinoma.  EXAM: MAMMOGRAPHIC GUIDED RADIOACTIVE SEED LOCALIZATION OF THE RIGHT BREAST  COMPARISON: Previous exam(s).  FINDINGS: Patient presents for radioactive seed localization prior to right breast lumpectomy. I met with the patient and we discussed the procedure of seed localization including benefits and alternatives. We discussed the high likelihood of a successful procedure. We discussed the risks of the procedure including infection, bleeding, tissue injury and further surgery. We discussed the low dose of radioactivity involved in the procedure. Informed, written consent was given.  The usual time-out protocol was performed immediately prior to the procedure.  Using mammographic guidance, sterile technique, 1% lidocaine and an I-125 radioactive seed, heart shaped biopsy marking clip was localized using a lateral approach. The follow-up mammogram images confirm the seed in the expected location and were marked for Dr. Dalbert Batman.  Follow-up survey of the patient confirms presence of the radioactive seed.  Order number of I-125 seed: [id number].  Total activity: 9.509 millicuries Reference Date: 05/12/2015  The patient tolerated the procedure well and was released from the [place]. She was given instructions regarding seed removal.  IMPRESSION: Radioactive seed localization right breast. No apparent complications.   Electronically Signed  By: Lovey Newcomer M.D.  On: 05/22/2015 15:25   Study Result     CLINICAL DATA: Patient recently underwent right breast ultrasound-guided core needle biopsy for AA small mass. This was positive for invasive ductal carcinoma. Current breast MRI is to evaluate for extent of disease.  LABS: Not drawn at time of imaging.  EXAM: BILATERAL BREAST  MRI WITH AND WITHOUT CONTRAST  TECHNIQUE: Multiplanar, multisequence MR images of both breasts were obtained prior to and following the intravenous administration of 14 ml of MultiHance.  THREE-DIMENSIONAL MR IMAGE RENDERING ON INDEPENDENT WORKSTATION:  Three-dimensional MR images were rendered by post-processing of the original MR data on an independent workstation. The three-dimensional MR images were interpreted, and findings are reported in the following complete MRI report for this study. Three dimensional images were evaluated at the independent DynaCad workstation  COMPARISON: Previous exam(s).  FINDINGS: Breast composition: b. Scattered fibroglandular tissue.  Background parenchymal enhancement: Mild  Right breast: There is clip artifact along the posterior margin of the fibroglandular tissue in the upper outer right breast corresponding to the recent biopsy. There is a small amount of enhancement associated with the biopsy clip consistent with a small breast malignancy and adjacent post biopsy change. There are no other areas of abnormal enhancement within the right breast. Several enhancing normal lymph nodes are noted.  Left breast: No mass or abnormal enhancement.  Lymph nodes: No abnormal appearing lymph nodes.  Ancillary findings: None.  IMPRESSION: 1. Small breast malignancy in the right breast. 2. There are no other areas of abnormal enhancement to suggest additional foci of malignancy. 3. No enlarged or abnormal appearing lymph nodes.  RECOMMENDATION: Treatment as planned for the known right breast malignancy.  BI-RADS CATEGORY 6: Known biopsy-proven malignancy.   Electronically Signed  By: Lajean Manes M.D.  On: 05/07/2015 12:35   ADDENDUM REPORT: 04/24/2015 14:56 ADDENDUM: Pathology of the breast, right, needle core biopsy, 9 o'clock revealed INVASIVE DUCTAL CARCINOMA, GRADE 1. CARCINOMA IN SITU IS PRESENT. This was  found to be concordant by Dr. Shelly Bombard. Recommendations: Recommend breast MRI given additional vague shadowing area in outer right breast and DCIS component. Recommendations were relayed to Dr. Lance Sell nurse on 04/24/15 at 11:50 AM by Jetta Lout, [facility]). She asked that when the radiologist gives the results to the patient, please tell the patient to contact their office. Dr. Wolfgang Phoenix will review pathology report and recommendations and make decision  regarding MRI and surgical referral. Results and instructions to call Dr. Lance Sell office were relayed to the patient by Dr. Enriqueta Shutter. She stated she has done well following the biopsy. Electronically Signed  By: Everlean Alstrom M.D.  On: 04/24/2015 14:56     Study Result     CLINICAL DATA: 82 year old female with a mass/ an area of distortion in the outer right breast peer  EXAM: ULTRASOUND GUIDED RIGHT BREAST CORE NEEDLE BIOPSY  COMPARISON: Previous exam(s).  PROCEDURE: I met with the patient and we discussed the procedure of ultrasound-guided biopsy, including benefits and alternatives. We discussed the high likelihood of a successful procedure. We discussed the risks of the procedure including infection, bleeding, tissue injury, clip migration, and inadequate sampling. Informed written consent was given. The usual time-out protocol was performed immediately prior to the procedure.  Using sterile technique and 1% Lidocaine as local anesthetic, under direct ultrasound visualization, a 12 gauge vacuum-assisted device was used to perform biopsy of the mass/ distortion in the outer right breast at the approximate 9 o'clock positionusing a lateral to medial approach. At the conclusion of the procedure, a wing shaped tissue marker clip was deployed into the biopsy cavity. Follow-up 2-view mammogram was performed and dictated separately.  IMPRESSION: Ultrasound-guided biopsy of a mass/area of distortion  in the outer right breast.  Electronically Signed: By: Everlean Alstrom M.D. On: 04/22/2015 16:59   Study Result     CLINICAL DATA: Screening recall for possible right breast distortion.  EXAM: DIGITAL DIAGNOSTIC RIGHT MAMMOGRAM WITH 3D TOMOSYNTHESIS AND CAD  RIGHT BREAST ULTRASOUND  COMPARISON: Previous exam(s).  ACR Breast Density Category b: There are scattered areas of fibroglandular density.  FINDINGS: Spot compression CC and MLO tomosynthesis was performed of the outer right breast demonstrating a persistent area of subtle distortion.  Mammographic images were processed with CAD.  Physical examination of the outer right breast reveals focal pain at the approximate 9 o'clock position 4 cm from the nipple, with mild underlying thickening.  Targeted ultrasound of the right breast was performed demonstrating a small shadowing mass at 9 o'clock 4 cm from the nipple measuring 0.4 x 0.4 by 0.4 cm. This is felt to correspond with mammography findings. No definite lymphadenopathy seen in the right axilla.  IMPRESSION: Suspicious right breast mass.  RECOMMENDATION: Ultrasound-guided biopsy of the mass in the outer right breast is recommended. This will subsequently be performed and dictated separately.  I have discussed the findings and recommendations with the patient. Results were also provided in writing at the conclusion of the visit. If applicable, a reminder letter will be sent to the patient regarding the next appointment.  BI-RADS CATEGORY 4: Suspicious.   Electronically Signed  By: Everlean Alstrom M.D.  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 Dalbert Batman on 05/01/2015 R partial mastectomy with radioactive seed localization 05/23/2015 Dr. Dalbert Batman Adjuvant XRT DEXA 07/2015 with osteopenia  She has completed XRT. She is on arimidex. She was having nausea but feels this is overall better. She had hot flashes prior to the medication and notes that they are worse but tolerable.   We discussed her osteopenia. She takes calcium and vitamin D. We have previously discussed the use of prolia  or bisphosphonates with AI therapy. She will continue to think on this.  I will plan on seeing her back in 3 months.   All questions were answered. The patient knows to call the clinic with any problems, questions or concerns.  This document serves as a record of services personally performed by Ancil Linsey, MD. It was created on her behalf by Toni Amend, a trained medical scribe. The creation of this record is based on the scribe's personal observations and the provider's statements to them. This document has been checked and approved by the attending provider.  I have reviewed the above documentation for accuracy and completeness, and I agree with the above.  This note was electronically signed.    Molli Hazard, MD  09/19/2015 12:12 PM

## 2015-09-19 NOTE — Patient Instructions (Signed)
[place] at [facility] Discharge Instructions  RECOMMENDATIONS MADE BY THE CONSULTANT AND ANY TEST RESULTS WILL BE SENT TO YOUR REFERRING PHYSICIAN.  You were seen by Dr. Whitney Muse today. Prescription for Zofran (nausea medication) has been faxed to your pharmacy. Return to clinic in 3 months for follow-up visit. Call clinic with any questions or concerns.   Thank you for choosing [place] at [facility] to provide your oncology and hematology care.  To afford each patient quality time with our provider, please arrive at least 15 minutes before your scheduled appointment time.   Beginning January 23rd 2017 lab work for the [facility] will be done in the  Main lab at [facility] on 1st floor. If you have a lab appointment with the [facility] please come in thru the  Main Entrance and check in at the main information desk  You need to re-schedule your appointment should you arrive 10 or more minutes late.  We strive to give you quality time with our providers, and arriving late affects you and other patients whose appointments are after yours.  Also, if you no show three or more times for appointments you may be dismissed from the clinic at the providers discretion.     Again, thank you for choosing [facility].  Our hope is that these requests will decrease the amount of time that you wait before being seen by our physicians.       _____________________________________________________________  Should you have questions after your visit to [facility], please contact our office at [phone] between the hours of 8:30 a.m. and 4:30 p.m.  Voicemails left after 4:30 p.m. will not be returned until the following business day.  For prescription refill requests, have your pharmacy contact our office.         Resources For Cancer Patients and their Caregivers ? American Cancer Society: Can assist with  transportation, wigs, general needs, runs Look Good Feel Better.        250-430-8281 ? Cancer Care: Provides financial assistance, online support groups, medication/co-pay assistance.  1-800-813-HOPE 843-235-2454) ? [facility] Assists [place] cancer patients and their families through emotional , educational and financial support.  234-630-8275 ? [place] DSS Where to apply for food stamps, Medicaid and utility assistance. 8570867999 ? [facility]: Transportation to medical appointments. (339)732-4971 ? Social Security Administration: May apply for disability if have a Stage IV cancer. (786)592-1809 541-623-2084 ? [employer], Disability and Transit Services: Assists with nutrition, care and transit needs. [place] Support Programs: @10RELATIVEDAYS @ > Cancer Support Group  2nd Tuesday of the month 1pm-2pm, Journey Room  > Creative Journey  3rd Tuesday of the month 1130am-1pm, Journey Room  > Look Good Feel Better  1st Wednesday of the month 10am-12 noon, Journey Room (Call [place] to register [phone])

## 2015-09-22 ENCOUNTER — Encounter: Payer: Self-pay | Admitting: Family Medicine

## 2015-09-22 ENCOUNTER — Ambulatory Visit (INDEPENDENT_AMBULATORY_CARE_PROVIDER_SITE_OTHER): Payer: Medicare Other | Admitting: Family Medicine

## 2015-09-22 VITALS — BP 122/76 | Temp 97.5°F | Ht 62.0 in | Wt 167.6 lb

## 2015-09-22 DIAGNOSIS — N61 Mastitis without abscess: Secondary | ICD-10-CM | POA: Diagnosis not present

## 2015-09-22 DIAGNOSIS — W57XXXA Bitten or stung by nonvenomous insect and other nonvenomous arthropods, initial encounter: Secondary | ICD-10-CM

## 2015-09-22 DIAGNOSIS — T148 Other injury of unspecified body region: Secondary | ICD-10-CM

## 2015-09-22 MED ORDER — DOXYCYCLINE HYCLATE 100 MG PO CAPS: 100.0000 mg | ORAL_CAPSULE | Freq: Two times a day (BID) | ORAL | Status: DC

## 2015-09-22 NOTE — Patient Instructions (Signed)
Take the doxycycline one twice daily with a snack and a tall glass of water. Take the medication for the next 10 days. Recheck here in 2 weeks.

## 2015-09-22 NOTE — Progress Notes (Signed)
   Subjective:    Patient ID: [name], female    DOB: 15-Oct-1928, 82 y.o.   MRN: [id number]  HPI Patient arrives with tick bite on chest 3 weeks ago-started running fever on Friday and oncologist put her on keflex 500 but he told her she didn't feel like it was a tick born illness.  The patient has also seen her radiation oncologist who was concerned about this but recommended that the patient follow-up with [place] patient denies any high fevers headaches muscle aches or body aches. PMH breast cancer has had radiation and chemotherapy   Review of Systems See above    Objective:   Physical Exam Erythema of the breast is noted on the right side no masses felt there is no induration. It is slightly warm compared to the other side. Redness around the tick bite is noted approximately 1-1/2 inches in diameter.       Assessment & Plan:  Breast cellulitis possibly related to tick bite stop Keflex. Use doxycycline twice a day over the course of the next 10 days. Recheck the patient in 2 weeks. If fevers worsening issues or other problems follow-up sooner.

## 2015-09-29 DIAGNOSIS — S20161D Insect bite (nonvenomous) of breast, right breast, subsequent encounter: Secondary | ICD-10-CM | POA: Diagnosis not present

## 2015-09-29 DIAGNOSIS — C50411 Malignant neoplasm of upper-outer quadrant of right female breast: Secondary | ICD-10-CM | POA: Diagnosis not present

## 2015-09-29 DIAGNOSIS — Z923 Personal history of irradiation: Secondary | ICD-10-CM | POA: Diagnosis not present

## 2015-10-06 ENCOUNTER — Other Ambulatory Visit: Payer: Self-pay | Admitting: *Deleted

## 2015-10-06 ENCOUNTER — Ambulatory Visit (INDEPENDENT_AMBULATORY_CARE_PROVIDER_SITE_OTHER): Payer: Medicare Other | Admitting: Family Medicine

## 2015-10-06 ENCOUNTER — Encounter: Payer: Self-pay | Admitting: Family Medicine

## 2015-10-06 VITALS — BP 132/70 | Temp 98.2°F | Ht 64.5 in | Wt 168.0 lb

## 2015-10-06 DIAGNOSIS — E785 Hyperlipidemia, unspecified: Secondary | ICD-10-CM

## 2015-10-06 DIAGNOSIS — R7303 Prediabetes: Secondary | ICD-10-CM

## 2015-10-06 DIAGNOSIS — N61 Mastitis without abscess: Secondary | ICD-10-CM | POA: Diagnosis not present

## 2015-10-06 DIAGNOSIS — I10 Essential (primary) hypertension: Secondary | ICD-10-CM

## 2015-10-06 DIAGNOSIS — Z79899 Other long term (current) drug therapy: Secondary | ICD-10-CM

## 2015-10-06 MED ORDER — AMOXICILLIN-POT CLAVULANATE 875-125 MG PO TABS: 1.0000 | ORAL_TABLET | Freq: Two times a day (BID) | ORAL | 0 refills | Status: DC

## 2015-10-06 NOTE — Progress Notes (Signed)
Spoke with [place] office [phone]) and was told that Dr.Kinard was out of that office and was given the number for the [facility] Oncology office to speak to [facility] the nurse [phone]). Received Shirley's voicemail awaiting call back.

## 2015-10-06 NOTE — Progress Notes (Signed)
I discussed the case with Dr. Sondra Come. I recommend ultrasound of the right breast due to breast cellulitis to rule out abscess. Patient will be seen Dr. Sondra Come later in August and will follow-up with [place] in August. Please set up ultrasound of the right breast and inform the patient

## 2015-10-06 NOTE — Progress Notes (Signed)
Dr.Scott Luking to speak with Dr.Kinard

## 2015-10-06 NOTE — Patient Instructions (Signed)
Take the antibiotic as prescribed twice a day with food if it causes upset stomach or you cannot keep it down please stop it and let us know  Please follow-up in August do your lab work before your next visit.  If this area in your breast is getting a lot worse please notify us.  We will communicate with your radiation oncologists regarding this issue

## 2015-10-06 NOTE — Progress Notes (Signed)
   Subjective:    Patient ID: [name], female    DOB: 23-Jul-1928, 82 y.o.   MRN: [id number]  HPIRecheck tick bite under right breast. Finished doxy. Area is some better. Still has some redness and itching.   Having some nausea. Pt not sure if it is from tick bite or cancer med.  She had some nausea with doxycycline also with the cancer medication.   Review of Systems She denies high fever chills sweats body aches muscle pains    Objective:   Physical Exam Lungs are clear hearts regular the right breast has erythema and redness but no significant induration there is also the evidence of tach bite underneath the breast but this appears to be healing no sign of any abscess       Assessment & Plan:  Persistent cellulitis try 1 additional round of antibiotics. I don't find any evidence of an abscess I will discuss with her radiation oncologist possibly ultrasound of the breast may be helpful not sure if patient gets worse she needs follow-up otherwise we will see her back in approximately 2-1/2 weeks

## 2015-10-07 ENCOUNTER — Other Ambulatory Visit: Payer: Self-pay | Admitting: Family Medicine

## 2015-10-07 ENCOUNTER — Telehealth: Payer: Self-pay | Admitting: Family Medicine

## 2015-10-07 MED ORDER — CLINDAMYCIN HCL 300 MG PO CAPS: ORAL_CAPSULE | ORAL | 0 refills | Status: DC

## 2015-10-07 NOTE — Addendum Note (Signed)
Addended by: Ofilia Neas R on: 10/07/2015 10:10 AM   Modules accepted: Orders

## 2015-10-07 NOTE — Telephone Encounter (Signed)
Given the symptoms that the patient is having this goes along with the cellulitis. If she starts running fever to let us know. If dramatic changes in what looks like to get [place] to recheck. Use clindamycin that we are sending it see other message

## 2015-10-07 NOTE — Progress Notes (Signed)
Patient ultrasound scheduled for August 1st @ 11:30 am. Patient notified and verbalized understanding

## 2015-10-07 NOTE — Telephone Encounter (Signed)
Patient called back stating that she is experiencing pain in the right breast today when walking. Onset of symptoms started this morning. (Breast Ultrasound scheduled for patient on 10/14/15 @ 11:30 am)

## 2015-10-07 NOTE — Telephone Encounter (Signed)
Patient called stating that the Amoxicillin prescribed on yesterday made her feel extremely nauseous. Patient would like a different antibiotic to be called in. Please advise?

## 2015-10-07 NOTE — Addendum Note (Signed)
Addended by: Launa Grill on: 10/07/2015 09:29 AM   Modules accepted: Orders

## 2015-10-07 NOTE — Addendum Note (Signed)
Addended by: Ofilia Neas R on: 10/07/2015 10:09 AM   Modules accepted: Orders

## 2015-10-07 NOTE — Telephone Encounter (Signed)
Discontinue Augmentin-please list Augmentin on her allergies in under comments put extreme nausea thank you. Clindamycin 300 mg 1 3 times a day for 7 days

## 2015-10-07 NOTE — Telephone Encounter (Signed)
Spoke with patient and informed her per Dr.Scott Luking- We are sending over Clindamycin 300 MG take 1 tablet by mouth 3 times a day for 7 days. Also informed patient to discontinue Augmentin and medication is added to allergies. Patient verbalized understanding.

## 2015-10-07 NOTE — Telephone Encounter (Signed)
Spoke with patient and informed her per Dr.Scott Luking- Given the symptoms that you are  having this goes along with the cellulitis. If you start running fever  let us know. If dramatic changes in what looks like to get [place] to recheck. Use clindamycin. Patient verbalized understanding.

## 2015-10-09 ENCOUNTER — Encounter: Payer: Self-pay | Admitting: Pulmonary Disease

## 2015-10-09 ENCOUNTER — Ambulatory Visit (INDEPENDENT_AMBULATORY_CARE_PROVIDER_SITE_OTHER): Payer: Medicare Other | Admitting: Pulmonary Disease

## 2015-10-09 DIAGNOSIS — J449 Chronic obstructive pulmonary disease, unspecified: Secondary | ICD-10-CM

## 2015-10-09 DIAGNOSIS — IMO0001 Reserved for inherently not codable concepts without codable children: Secondary | ICD-10-CM

## 2015-10-09 MED ORDER — ALBUTEROL SULFATE 108 (90 BASE) MCG/ACT IN AEPB: 1.0000 | INHALATION_SPRAY | RESPIRATORY_TRACT | 5 refills | Status: DC | PRN

## 2015-10-09 NOTE — Assessment & Plan Note (Signed)
This has been a stable interval for [name] despite her recent diagnosis of breast cancer and her treatment.  Immunizations are up to date  Plan: Continue Spiriva Given coupon today for Albuterol respiclick to use as needed F/u 6 months Get a flu shot in the fall Repeat Pneumovax 2018

## 2015-10-09 NOTE — Progress Notes (Signed)
Subjective:    Patient ID: [name], female    DOB: 02/19/1929, 82 y.o.   MRN: [id number]  Synopsis: Referred in 2016 for chronic cough. Cough had resolved by the time she saw me. She been followed by [facility] for chronic cough for many years prior felt to be related to sinus congestion. She also had pulmonary nodules and some pleural calcification which was stable on chest imaging through 2012 there. After I saw her we did simple spirometry testing which showed clear airflow obstruction with an FEV1 of 48% predicted. However, the FVC was low at 1.38 L (54% predicted).  She smoked 2 packs a day for many years.   HPI Chief Complaint  Patient presents with  . Follow-up    pt c/o stable sob with exertion.  Denies CP, mucus production, cough.     Sonie has been suffering from cellulitis in her R breast and has been taking an antibiotic for it now.  She says the redness is clearing up. She says that the antibiotic has been helping some.  She continues to have some nausea.  Her breathing has been stable, occasional dyspnea when she climbs stairs, etc.    Past Medical History:  Diagnosis Date  . Cancer (HCC)    BREAST CANCER   . Colon polyps   . COPD (chronic obstructive pulmonary disease) ([place])    DR. [place] Bells  . Diabetes mellitus without complication ([place])    PREDIABETIC  CONTROL W/ DIET  . Hypercholesteremia   . Hypertension   . Lung nodules   . Osteoarthritis   . Osteoporosis   . PONV (postoperative nausea and vomiting)   . Shortness of breath dyspnea    W/ EXERTION      Review of Systems     Objective:   Physical Exam Vitals:   10/09/15 1438  BP: 116/62  Pulse: 68  SpO2: 94%  Weight: 167 lb 12.8 oz (76.1 kg)  Height: 5' 4.5" (1.638 m)    RA  Gen: well appearing HENT: OP clear, TM's clear, neck supple PULM: CTA B CV: RRR, slight systolic murmur, trace edema GI: BS+, soft, nontender Derm: small rash under R breast, no redness, mild  erythema Psyche: normal mood and affect   CBC    Component Value Date/Time   WBC 5.5 05/20/2015 1116   RBC 4.04 05/20/2015 1116   HGB 12.4 05/20/2015 1116   HCT 38.6 05/20/2015 1116   HCT 40.4 10/11/2014 0804   PLT 248 05/20/2015 1116   PLT 290 10/11/2014 0804   MCV 95.5 05/20/2015 1116   MCV 92 10/11/2014 0804   MCH 30.7 05/20/2015 1116   MCHC 32.1 05/20/2015 1116   RDW 13.1 05/20/2015 1116   RDW 13.5 10/11/2014 0804   LYMPHSABS 1.8 10/11/2014 0804   EOSABS 0.1 10/11/2014 0804   BASOSABS 0.0 10/11/2014 0804        Assessment & Plan:  COPD with bronchitis This has been a stable interval for [name] despite her recent diagnosis of breast cancer and her treatment.  Immunizations are up to date  Plan: Continue Spiriva Given coupon today for Albuterol respiclick to use as needed F/u 6 months Get a flu shot in the fall Repeat Pneumovax 2018  > 50% of today's 28 minute visit spent face to face   Current Outpatient Prescriptions:  .  anastrozole (ARIMIDEX) 1 MG tablet, Take 1 tablet (1 mg total) by mouth daily., Disp: 30 tablet, Rfl: 6 .  Bilberry, Vaccinium myrtillus, (  BILBERRY PO), Take 1 Dose by mouth daily. , Disp: , Rfl:  .  clindamycin (CLEOCIN) 300 MG capsule, Take 1 tablet by mouth three times a day for 7 days., Disp: 21 capsule, Rfl: 0 .  fluticasone (FLONASE) 50 MCG/ACT nasal spray, Place 2 sprays into both nostrils daily. Sometimes uses a second dose qhs, Disp: , Rfl:  .  furosemide (LASIX) 20 MG tablet, TAKE (1) TABLET BY MOUTH EACH MORNING., Disp: 90 tablet, Rfl: 1 .  lisinopril (PRINIVIL,ZESTRIL) 20 MG tablet, Take 1 tablet (20 mg total) by mouth daily., Disp: 90 tablet, Rfl: 2 .  Multiple Vitamins-Minerals (CENTRUM SILVER ADULT 50+ PO), Take by mouth., Disp: , Rfl:  .  Multiple Vitamins-Minerals (VISION-VITE PRESERVE PO), Take by mouth., Disp: , Rfl:  .  nortriptyline (PAMELOR) 10 MG capsule, Take 20 mg by mouth at bedtime. Take 2 at night, Disp: , Rfl:  .   polyethylene glycol (MIRALAX / GLYCOLAX) packet, Take 17 g by mouth daily as needed. Constipation, Disp: , Rfl:  .  SPIRIVA RESPIMAT 2.5 MCG/ACT AERS, INHALE 2 PUFFS EACH DAY., Disp: 4 g, Rfl: 5 .  Albuterol Sulfate (PROAIR RESPICLICK) 123XX123 (90 Base) MCG/ACT AEPB, Inhale 1 puff into the lungs every 4 (four) hours as needed (Wheezing, chest tightness)., Disp: 1 each, Rfl: 5

## 2015-10-09 NOTE — Patient Instructions (Signed)
Complete the antibiotic prescribed by your physicians and I recommend that she go to the ultrasound they have ordered Keep taking your medications as you're doing You can use albuterol on an as-needed basis for chest tightness or wheezing Follow-up with [place] in 6 months

## 2015-10-14 ENCOUNTER — Ambulatory Visit (HOSPITAL_COMMUNITY)
Admission: RE | Admit: 2015-10-14 | Discharge: 2015-10-14 | Disposition: A | Discharge status: Home / Self Care | Payer: Medicare Other | Source: Ambulatory Visit | Attending: Family Medicine | Admitting: Family Medicine

## 2015-10-14 DIAGNOSIS — N6489 Other specified disorders of breast: Secondary | ICD-10-CM | POA: Diagnosis not present

## 2015-10-14 DIAGNOSIS — N61 Mastitis without abscess: Secondary | ICD-10-CM | POA: Insufficient documentation

## 2015-10-15 DIAGNOSIS — R7303 Prediabetes: Secondary | ICD-10-CM | POA: Diagnosis not present

## 2015-10-15 DIAGNOSIS — I10 Essential (primary) hypertension: Secondary | ICD-10-CM | POA: Diagnosis not present

## 2015-10-15 DIAGNOSIS — Z79899 Other long term (current) drug therapy: Secondary | ICD-10-CM | POA: Diagnosis not present

## 2015-10-15 DIAGNOSIS — E785 Hyperlipidemia, unspecified: Secondary | ICD-10-CM | POA: Diagnosis not present

## 2015-10-16 ENCOUNTER — Ambulatory Visit (INDEPENDENT_AMBULATORY_CARE_PROVIDER_SITE_OTHER): Payer: Medicare Other | Admitting: Nurse Practitioner

## 2015-10-16 ENCOUNTER — Encounter: Payer: Self-pay | Admitting: Nurse Practitioner

## 2015-10-16 VITALS — BP 132/70 | Temp 97.9°F | Ht 64.5 in | Wt 170.1 lb

## 2015-10-16 DIAGNOSIS — N61 Mastitis without abscess: Secondary | ICD-10-CM | POA: Diagnosis not present

## 2015-10-16 LAB — HEPATIC FUNCTION PANEL
ALK PHOS: 58 IU/L (ref 39–117)
ALT: 24 IU/L (ref 0–32)
AST: 30 IU/L (ref 0–40)
Albumin: 4.1 g/dL (ref 3.5–4.7)
Bilirubin Total: 0.4 mg/dL (ref 0.0–1.2)
Bilirubin, Direct: 0.1 mg/dL (ref 0.00–0.40)
Total Protein: 6.3 g/dL (ref 6.0–8.5)

## 2015-10-16 LAB — BASIC METABOLIC PANEL
BUN/Creatinine Ratio: 31 — ABNORMAL HIGH (ref 12–28)
BUN: 29 mg/dL — ABNORMAL HIGH (ref 8–27)
CALCIUM: 10.1 mg/dL (ref 8.7–10.3)
CO2: 27 mmol/L (ref 18–29)
Chloride: 99 mmol/L (ref 96–106)
Creatinine, Ser: 0.95 mg/dL (ref 0.57–1.00)
GFR calc Af Amer: 64 mL/min/{1.73_m2} (ref >59)
GFR, EST NON AFRICAN AMERICAN: 55 mL/min/{1.73_m2} — AB (ref >59)
GLUCOSE: 97 mg/dL (ref 65–99)
POTASSIUM: 4.7 mmol/L (ref 3.5–5.2)
SODIUM: 139 mmol/L (ref 134–144)

## 2015-10-16 LAB — LIPID PANEL
CHOLESTEROL TOTAL: 228 mg/dL — AB (ref 100–199)
Chol/HDL Ratio: 2.8 ratio units (ref 0.0–4.4)
HDL: 82 mg/dL (ref >39)
LDL CALC: 125 mg/dL — AB (ref 0–99)
TRIGLYCERIDES: 104 mg/dL (ref 0–149)
VLDL CHOLESTEROL CAL: 21 mg/dL (ref 5–40)

## 2015-10-16 LAB — HEMOGLOBIN A1C
ESTIMATED AVERAGE GLUCOSE: 123 mg/dL
Hgb A1c MFr Bld: 5.9 % — ABNORMAL HIGH (ref 4.8–5.6)

## 2015-10-16 MED ORDER — CLINDAMYCIN HCL 300 MG PO CAPS: ORAL_CAPSULE | ORAL | 0 refills | Status: DC

## 2015-10-17 ENCOUNTER — Encounter: Payer: Self-pay | Admitting: Nurse Practitioner

## 2015-10-17 NOTE — Progress Notes (Signed)
Subjective:  Presents for follow-up see previous notes. The upper part of her breast has started to improve but just completed her course of clindamycin. Still has some erythema and warmth at the lower part of the right breast. No fever.  Objective:   BP 132/70   Temp 97.9 F (36.6 C) (Oral)   Ht 5' 4.5" (1.638 m)   Wt 170 lb 2 oz (77.2 kg)   BMI 28.75 kg/m  NAD. Alert, oriented. Generalized moderate erythema along the mid to lower right breast with mild edema and tenderness. Ultrasound of the right breast dated 7/25 shows changes consistent with mastitis.  Assessment: Mastitis, right, acute  Plan:  Meds ordered this encounter  Medications  . clindamycin (CLEOCIN) 300 MG capsule    Sig: Take 1 tablet by mouth three times a day for 7 days.    Dispense:  21 capsule    Refill:  0    Order Specific Question:   Supervising Provider    Answer:   Maggie Font   Given another course of clindamycin. Warning signs reviewed. Has an appointment with Dr. Nicki Reaper on 8/10. Recheck at that time. Call back sooner if symptoms worsen.

## 2015-10-20 ENCOUNTER — Ambulatory Visit: Payer: Medicare Other | Admitting: Family Medicine

## 2015-10-23 ENCOUNTER — Ambulatory Visit (INDEPENDENT_AMBULATORY_CARE_PROVIDER_SITE_OTHER): Payer: Medicare Other | Admitting: Family Medicine

## 2015-10-23 ENCOUNTER — Encounter: Payer: Self-pay | Admitting: Family Medicine

## 2015-10-23 VITALS — BP 116/68 | Ht 64.5 in | Wt 169.0 lb

## 2015-10-23 DIAGNOSIS — R7303 Prediabetes: Secondary | ICD-10-CM

## 2015-10-23 DIAGNOSIS — I10 Essential (primary) hypertension: Secondary | ICD-10-CM | POA: Diagnosis not present

## 2015-10-23 DIAGNOSIS — N61 Mastitis without abscess: Secondary | ICD-10-CM

## 2015-10-23 DIAGNOSIS — E785 Hyperlipidemia, unspecified: Secondary | ICD-10-CM | POA: Diagnosis not present

## 2015-10-23 MED ORDER — DOXYCYCLINE HYCLATE 100 MG PO CAPS: 100.0000 mg | ORAL_CAPSULE | Freq: Two times a day (BID) | ORAL | 0 refills | Status: DC

## 2015-10-23 NOTE — Progress Notes (Signed)
   Subjective:    Patient ID: [name], female    DOB: 05-19-1928, 82 y.o.   MRN: [id number]  Hypertension  This is a chronic problem. The current episode started more than 1 year ago. There are no compliance problems (eats healthy and exercises).    Rash under right breast. Came up in June. Not painful now. Has tried antibiotics. Finished today. Still having redness but is some better.  Patient has a history of cellulitis of the breasts right breast with a tick bite underneath she's been treated with doxycycline then around of clindamycin she still has ongoing erythema although not quite as bad  Patient also has prediabetes watch his diet fairly closely  Hyperlipidemia watch his diet closely  COPD she will need a booster on her pneumonia vaccine next year she'll need a flu vaccine coming up    Review of Systems    she relates some redness soreness in the right breast not as severe as what it was she denies any chest tightness pressure pain or shortness of breath other than her COPD Objective:   Physical Exam Lungs clear heart regular breast mild erythema some skin thickening but not severe no dimpling no masses felt.   25 minutes was spent with the patient. Greater than half the time was spent in discussion and answering questions and counseling regarding the issues that the patient came in for today. Her lab work was reviewed in detail including the prediabetes hyperlipidemia and kidney function    Assessment & Plan:  We will send notes to Dr. Dalbert Batman. If patient has continued cellulitis symptoms despite him no improvement with antibiotics I would recommend consideration for punch biopsy because of the patient's breast cancer history. I have specifically encourage patient to discuss this with Dr. Dalbert Batman when she sees them later this month  Doxycycline twice a day 10 days  Follow-up in one month  Hyperlipidemia watch diet closely stay physically active  Prediabetes  improvement continue current measures  Hypertension good control continue current measures

## 2015-11-03 ENCOUNTER — Telehealth: Payer: Self-pay | Admitting: Family Medicine

## 2015-11-03 DIAGNOSIS — C50411 Malignant neoplasm of upper-outer quadrant of right female breast: Secondary | ICD-10-CM | POA: Diagnosis not present

## 2015-11-03 DIAGNOSIS — Z923 Personal history of irradiation: Secondary | ICD-10-CM | POA: Diagnosis not present

## 2015-11-03 NOTE — Telephone Encounter (Signed)
Patient would like to know if she needs to do another course of antibiotics until she sees the surgeon on the 29th. Please advise?

## 2015-11-03 NOTE — Telephone Encounter (Signed)
Pt has finished taking the doxycycline yesterday and is not scheduled to see the surgeon till the 29th. Pt wants to know if she needs to go without an antibiotic that long or not. Pt has an appt in [place] today, so if you get voice mail leave the message on there.  Please advise.

## 2015-11-03 NOTE — Telephone Encounter (Signed)
Tried to call no answer 11/03/15

## 2015-11-03 NOTE — Telephone Encounter (Signed)
May refill medication. Please confirm with the patient which surgeon she is seen so I can send information to the surgeon regarding what we have been doing-thank you

## 2015-11-03 NOTE — Telephone Encounter (Signed)
Left message to return call to get more info.  

## 2015-11-04 ENCOUNTER — Other Ambulatory Visit: Payer: Self-pay | Admitting: *Deleted

## 2015-11-04 NOTE — Telephone Encounter (Signed)
Tried to call no answer

## 2015-11-05 MED ORDER — DOXYCYCLINE HYCLATE 100 MG PO CAPS: 100.0000 mg | ORAL_CAPSULE | Freq: Two times a day (BID) | ORAL | 0 refills | Status: DC

## 2015-11-05 NOTE — Telephone Encounter (Signed)
Spoke with patient and informed her per Dr.Scott Luking- May refill medication. . Patient verbalized understanding and stated that she is going to be Dr.Ingram for her surgery.

## 2015-11-07 DIAGNOSIS — H353131 Nonexudative age-related macular degeneration, bilateral, early dry stage: Secondary | ICD-10-CM | POA: Diagnosis not present

## 2015-11-07 DIAGNOSIS — H40013 Open angle with borderline findings, low risk, bilateral: Secondary | ICD-10-CM | POA: Diagnosis not present

## 2015-11-07 DIAGNOSIS — H2513 Age-related nuclear cataract, bilateral: Secondary | ICD-10-CM | POA: Diagnosis not present

## 2015-11-07 DIAGNOSIS — H35373 Puckering of macula, bilateral: Secondary | ICD-10-CM | POA: Diagnosis not present

## 2015-11-09 ENCOUNTER — Encounter: Payer: Self-pay | Admitting: Family Medicine

## 2015-11-09 NOTE — Telephone Encounter (Signed)
A letter was dictated please send this to Dr. Dalbert Batman thank you

## 2015-11-10 NOTE — Telephone Encounter (Signed)
This was faxed to Dr. Darrel Hoover office.

## 2015-11-11 DIAGNOSIS — Z9049 Acquired absence of other specified parts of digestive tract: Secondary | ICD-10-CM | POA: Diagnosis not present

## 2015-11-11 DIAGNOSIS — J449 Chronic obstructive pulmonary disease, unspecified: Secondary | ICD-10-CM | POA: Diagnosis not present

## 2015-11-11 DIAGNOSIS — Z90710 Acquired absence of both cervix and uterus: Secondary | ICD-10-CM | POA: Diagnosis not present

## 2015-11-11 DIAGNOSIS — Z90721 Acquired absence of ovaries, unilateral: Secondary | ICD-10-CM | POA: Diagnosis not present

## 2015-11-11 DIAGNOSIS — I10 Essential (primary) hypertension: Secondary | ICD-10-CM | POA: Diagnosis not present

## 2015-11-11 DIAGNOSIS — G5793 Unspecified mononeuropathy of bilateral lower limbs: Secondary | ICD-10-CM | POA: Diagnosis not present

## 2015-11-11 DIAGNOSIS — Z9889 Other specified postprocedural states: Secondary | ICD-10-CM | POA: Diagnosis not present

## 2015-11-11 DIAGNOSIS — C50111 Malignant neoplasm of central portion of right female breast: Secondary | ICD-10-CM | POA: Diagnosis not present

## 2015-11-12 ENCOUNTER — Other Ambulatory Visit: Payer: Self-pay | Admitting: General Surgery

## 2015-11-12 DIAGNOSIS — L308 Other specified dermatitis: Secondary | ICD-10-CM | POA: Diagnosis not present

## 2015-11-12 DIAGNOSIS — Z853 Personal history of malignant neoplasm of breast: Secondary | ICD-10-CM | POA: Diagnosis not present

## 2015-11-12 DIAGNOSIS — L309 Dermatitis, unspecified: Secondary | ICD-10-CM | POA: Diagnosis not present

## 2015-11-14 ENCOUNTER — Other Ambulatory Visit: Payer: Self-pay

## 2015-11-18 ENCOUNTER — Telehealth: Payer: Self-pay | Admitting: Family Medicine

## 2015-11-18 NOTE — Telephone Encounter (Signed)
Please review Surgical Pathology results.

## 2015-11-18 NOTE — Telephone Encounter (Signed)
Punch biopsy negative. No sign of cancer. Please make sure patient is aware of this. Have her keep her follow-up in mid September thank you

## 2015-11-19 NOTE — Telephone Encounter (Signed)
Discussed with pt. Pt verbalized understanding.  °

## 2015-11-20 ENCOUNTER — Ambulatory Visit: Payer: Medicare Other | Admitting: Family Medicine

## 2015-11-27 ENCOUNTER — Encounter: Payer: Self-pay | Admitting: Family Medicine

## 2015-11-27 ENCOUNTER — Ambulatory Visit (INDEPENDENT_AMBULATORY_CARE_PROVIDER_SITE_OTHER): Payer: Medicare Other | Admitting: Family Medicine

## 2015-11-27 VITALS — BP 142/76 | Temp 98.4°F | Ht 62.0 in | Wt 172.0 lb

## 2015-11-27 DIAGNOSIS — N3000 Acute cystitis without hematuria: Secondary | ICD-10-CM | POA: Diagnosis not present

## 2015-11-27 DIAGNOSIS — R350 Frequency of micturition: Secondary | ICD-10-CM | POA: Diagnosis not present

## 2015-11-27 DIAGNOSIS — L309 Dermatitis, unspecified: Secondary | ICD-10-CM | POA: Diagnosis not present

## 2015-11-27 LAB — POCT URINALYSIS DIPSTICK
RBC UA: 250
Spec Grav, UA: <=1.005
pH, UA: 6

## 2015-11-27 MED ORDER — CEFPROZIL 500 MG PO TABS: 500.0000 mg | ORAL_TABLET | Freq: Two times a day (BID) | ORAL | 0 refills | Status: DC

## 2015-11-27 NOTE — Progress Notes (Signed)
   Subjective:    Patient ID: [name], female    DOB: 1928/08/31, 82 y.o.   MRN: [id number]  HPIrecheck cellulitis of breast. She saw her specialist they did a biopsy of his dermatitis no sign of infection no sign of cancer  Pt having frequent urination for the past week. Patient denies high fever chills sweats flank pain    Review of Systems  Constitutional: Negative for fatigue and fever.  HENT: Negative for congestion.   Respiratory: Negative for cough.   Cardiovascular: Negative for chest pain.  Gastrointestinal: Negative for abdominal pain.       Objective:   Physical Exam  Constitutional: She appears well-nourished. No distress.  HENT:  Head: Normocephalic.  Cardiovascular: Normal rate, regular rhythm and normal heart sounds.   No murmur heard. Pulmonary/Chest: Effort normal and breath sounds normal.  Musculoskeletal: She exhibits no edema.  Lymphadenopathy:    She has no cervical adenopathy.  Neurological: She is alert.  Psychiatric: Her behavior is normal.  Vitals reviewed.  Do breast is erythematous but much less so than what it was       Assessment & Plan:  Dermatitis should gradually get better on insulin without any intervention follow-up with all of her specialist follow-up with [place] in approximately 4-5 months for a wellness preventative.  UTI antibiotics prescribed

## 2015-11-29 LAB — URINE CULTURE

## 2015-12-16 ENCOUNTER — Ambulatory Visit (INDEPENDENT_AMBULATORY_CARE_PROVIDER_SITE_OTHER): Payer: Medicare Other | Admitting: *Deleted

## 2015-12-16 DIAGNOSIS — Z23 Encounter for immunization: Secondary | ICD-10-CM | POA: Diagnosis not present

## 2015-12-16 DIAGNOSIS — B372 Candidiasis of skin and nail: Secondary | ICD-10-CM | POA: Diagnosis not present

## 2015-12-16 DIAGNOSIS — L259 Unspecified contact dermatitis, unspecified cause: Secondary | ICD-10-CM | POA: Diagnosis not present

## 2015-12-22 DIAGNOSIS — C50411 Malignant neoplasm of upper-outer quadrant of right female breast: Secondary | ICD-10-CM | POA: Diagnosis not present

## 2015-12-22 DIAGNOSIS — Z923 Personal history of irradiation: Secondary | ICD-10-CM | POA: Diagnosis not present

## 2015-12-22 DIAGNOSIS — Z08 Encounter for follow-up examination after completed treatment for malignant neoplasm: Secondary | ICD-10-CM | POA: Diagnosis not present

## 2015-12-22 DIAGNOSIS — L309 Dermatitis, unspecified: Secondary | ICD-10-CM | POA: Diagnosis not present

## 2015-12-24 ENCOUNTER — Encounter (HOSPITAL_COMMUNITY): Payer: Medicare Other | Attending: Hematology & Oncology | Admitting: Hematology & Oncology

## 2015-12-24 ENCOUNTER — Encounter (HOSPITAL_COMMUNITY): Payer: Self-pay | Admitting: Hematology & Oncology

## 2015-12-24 VITALS — BP 135/49 | HR 81 | Temp 98.1°F | Resp 16 | Wt 173.1 lb

## 2015-12-24 DIAGNOSIS — C50411 Malignant neoplasm of upper-outer quadrant of right female breast: Secondary | ICD-10-CM | POA: Insufficient documentation

## 2015-12-24 DIAGNOSIS — R232 Flushing: Secondary | ICD-10-CM

## 2015-12-24 DIAGNOSIS — L309 Dermatitis, unspecified: Secondary | ICD-10-CM

## 2015-12-24 DIAGNOSIS — Z170 Estrogen receptor positive status [ER+]: Secondary | ICD-10-CM

## 2015-12-24 DIAGNOSIS — M8588 Other specified disorders of bone density and structure, other site: Secondary | ICD-10-CM

## 2015-12-24 DIAGNOSIS — Z79899 Other long term (current) drug therapy: Secondary | ICD-10-CM | POA: Insufficient documentation

## 2015-12-24 DIAGNOSIS — T451X5A Adverse effect of antineoplastic and immunosuppressive drugs, initial encounter: Secondary | ICD-10-CM

## 2015-12-24 DIAGNOSIS — Z79811 Long term (current) use of aromatase inhibitors: Secondary | ICD-10-CM | POA: Diagnosis not present

## 2015-12-24 DIAGNOSIS — M8580 Other specified disorders of bone density and structure, unspecified site: Secondary | ICD-10-CM | POA: Insufficient documentation

## 2015-12-24 NOTE — Progress Notes (Signed)
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 Dalbert Batman on 05/01/2015 Adjuvant XRT  HISTORY OF PRESENTING ILLNESS:  [name] 82 y.o. female is here for additional follow-up of ER+ carcinoma of the R breast. She has completed XRT. She continues on arimidex without difficulty.   [name] returns to the [place] unaccompanied.  She was given a cream by her dermatologist which feels greasy but helps. "It isn't completely clear but it is much better than it was" referring to her breast dermatitis.   She is doing well with her Arimidex. She takes it at night. Her joints are okay. She continues to have a few hot flashes, noting the cooler weather has helped.  She is not taking calcium or Vitamin D supplements. Last year when her blood work was taken at PCP Dr. Lance Sell office her calcium was high. She spoke with Dr. Wolfgang Phoenix again in July about her calcium and was told to continue doing as she is doing because her counts were good. Dr. Wolfgang Phoenix has put in an order for her next diagnostic mammogram.  She had a breast exam on Monday, 10/9 with her gynecologist.  She denies abdominal pain or bowel issues. Appetite and energy are good.   MEDICAL HISTORY:  Past Medical History:  Diagnosis Date  . Cancer (HCC)    BREAST CANCER   . Colon polyps   . COPD (chronic obstructive pulmonary disease) ([place])    DR. [place] Bells  . Diabetes mellitus without complication ([place])    PREDIABETIC  CONTROL W/ DIET  . Hypercholesteremia   . Hypertension   . Lung nodules   .  Osteoarthritis   . Osteoporosis   . PONV (postoperative nausea and vomiting)   . Shortness of breath dyspnea    W/ EXERTION    SURGICAL HISTORY: Past Surgical History:  Procedure Laterality Date  . ABDOMINAL HYSTERECTOMY    . APPENDECTOMY    . BARTHOLIN GLAND CYST EXCISION    . BREAST LUMPECTOMY WITH RADIOACTIVE SEED LOCALIZATION Right 05/23/2015   Procedure: RIGHT BREAST LUMPECTOMY WITH RADIOACTIVE SEED LOCALIZATION;  Surgeon: Fanny Skates, MD;  Location: [facility];  Service: General;  Laterality: Right;  . BREAST SURGERY     2000  LEFT BREAST  LUMP REMOVED  . colonoscopy    . COLONOSCOPY  03/25/2011   Procedure: COLONOSCOPY;  Surgeon: Rogene Houston, MD;  [facility];  Service: Endoscopy;  Laterality: N/A;  9:30 am  . left inguinal hernia repair    . multiple toe surgeries    . TONSILLECTOMY      SOCIAL HISTORY: Social History   Social History  . Marital status: Widowed    Spouse name: N/A  . Number of children: N/A  . Years of education: N/A   Occupational History  . Not on file.   Social History Main Topics  . Smoking status: Former Smoker    Packs/day: 2.00    Years: 50.00    Types: Cigarettes    Quit date: 03/15/1988  . Smokeless tobacco: Never Used  . Alcohol use No  . Drug use: No  .  Sexual activity: Not on file   Other Topics Concern  . Not on file   Social History Narrative  . No narrative on file  Widowed 0 children She has nieces and nephews Worked as a IT consultant Ex smoker, quit in 1989.   FAMILY HISTORY: Family History  Problem Relation Age of Onset  . Colon cancer Father    indicated that the status of her father is unknown.   Father deceased at 44 yo of colon cancer that spread to his bone. He was a smoker. Mother deceased at 54 yo of COPD and chronic heart failure. She was a non-smoker. 1 sister who is 39 years younger. No family history of breast cancer that she is aware of.  ALLERGIES:  is allergic to augmentin  [amoxicillin-pot clavulanate]; amoxicillin; codeine; morphine; and neomycin-bacitracin zn-polymyx.  MEDICATIONS:  Current Outpatient Prescriptions  Medication Sig Dispense Refill  . Albuterol Sulfate (PROAIR RESPICLICK) 458 (90 Base) MCG/ACT AEPB Inhale 1 puff into the lungs every 4 (four) hours as needed (Wheezing, chest tightness). 1 each 5  . anastrozole (ARIMIDEX) 1 MG tablet Take 1 tablet (1 mg total) by mouth daily. 30 tablet 6  . Bilberry, Vaccinium myrtillus, (BILBERRY PO) Take 1 Dose by mouth daily.     . cefPROZIL (CEFZIL) 500 MG tablet Take 1 tablet (500 mg total) by mouth 2 (two) times daily. 10 tablet 0  . fluticasone (FLONASE) 50 MCG/ACT nasal spray Place 2 sprays into both nostrils daily. Sometimes uses a second dose qhs    . furosemide (LASIX) 20 MG tablet TAKE (1) TABLET BY MOUTH EACH MORNING. 90 tablet 1  . lisinopril (PRINIVIL,ZESTRIL) 20 MG tablet Take 1 tablet (20 mg total) by mouth daily. 90 tablet 2  . Multiple Vitamins-Minerals (CENTRUM SILVER ADULT 50+ PO) Take by mouth.    . Multiple Vitamins-Minerals (VISION-VITE PRESERVE PO) Take by mouth.    . nortriptyline (PAMELOR) 10 MG capsule Take 20 mg by mouth at bedtime. Take 2 at night    . polyethylene glycol (MIRALAX / GLYCOLAX) packet Take 17 g by mouth daily as needed. Constipation    . SPIRIVA RESPIMAT 2.5 MCG/ACT AERS INHALE 2 PUFFS EACH DAY. 4 g 5   No current facility-administered medications for this visit.     Review of Systems  Constitutional: Negative.  Negative for chills, fever, malaise/fatigue and weight loss.       Intermittent hot flashes  HENT: Negative.  Negative for congestion, hearing loss, nosebleeds, sore throat and tinnitus.   Eyes: Negative.  Negative for blurred vision, double vision, pain and discharge.  Respiratory: Negative.  Negative for cough, hemoptysis, sputum production, shortness of breath and wheezing.   Cardiovascular: Negative.  Negative for chest pain, palpitations, claudication,  leg swelling and PND.  Gastrointestinal: Negative.  Negative for abdominal pain, blood in stool, constipation, diarrhea, heartburn, melena, nausea and vomiting.  Genitourinary: Negative.  Negative for dysuria, frequency, hematuria and urgency.  Musculoskeletal: Negative.  Negative for falls, joint pain and myalgias.  Skin: Negative.  Negative for itching and rash.       Dermatitis improving  Neurological: Negative.  Negative for dizziness, tingling, tremors, sensory change, speech change, focal weakness, seizures, loss of consciousness, weakness and headaches.  Endo/Heme/Allergies: Negative.  Does not bruise/bleed easily.  Psychiatric/Behavioral: Negative for depression, memory loss, substance abuse and suicidal ideas. The patient is nervous/anxious. The patient does not have insomnia.   All other systems reviewed and are negative.  14 point ROS was done and is otherwise  as detailed above or in HPI   PHYSICAL EXAMINATION: ECOG PERFORMANCE STATUS: 0 - Asymptomatic  Vitals:   12/24/15 1119  BP: (!) 135/49  Pulse: 81  Resp: 16  Temp: 98.1 F (36.7 C)   Filed Weights   12/24/15 1119  Weight: 173 lb 1.6 oz (78.5 kg)    Physical Exam  Constitutional: She is oriented to person, place, and time and well-developed, well-nourished, and in no distress.  Appears younger than stated age.  HENT:  Head: Normocephalic and atraumatic.  Nose: Nose normal.  Mouth/Throat: Oropharynx is clear and moist. No oropharyngeal exudate.  Dentures on top and bottom.  Eyes: Conjunctivae and EOM are normal. Pupils are equal, round, and reactive to light. Right eye exhibits no discharge. Left eye exhibits no discharge. No scleral icterus.  Neck: Normal range of motion. Neck supple. No tracheal deviation present. No thyromegaly present.  Cardiovascular: Normal rate, regular rhythm and normal heart sounds.  Exam reveals no gallop and no friction rub.   No murmur heard. Pulmonary/Chest: Effort normal and  breath sounds normal. She has no wheezes. She has no rales.  Abdominal: Soft. Bowel sounds are normal. She exhibits no distension and no mass. There is no tenderness. There is no rebound and no guarding.  Musculoskeletal: Normal range of motion. She exhibits no edema.  Lymphadenopathy:    She has no cervical adenopathy.  Neurological: She is alert and oriented to person, place, and time. She has normal reflexes. No cranial nerve deficit. Gait normal. Coordination normal.  Skin: Skin is warm and dry.  Raised erythematous lesion probably about 6 cm in largest dimension in the left breast, extends over to the right breast.  Psychiatric: Mood, memory, affect and judgment normal.  Nursing note and vitals reviewed.   LABORATORY DATA:  I have reviewed the data as listed Lab Results  Component Value Date   WBC 5.5 05/20/2015   HGB 12.4 05/20/2015   HCT 38.6 05/20/2015   MCV 95.5 05/20/2015   PLT 248 05/20/2015   CMP     Component Value Date/Time   NA 139 10/15/2015 0816   K 4.7 10/15/2015 0816   CL 99 10/15/2015 0816   CO2 27 10/15/2015 0816   GLUCOSE 97 10/15/2015 0816   GLUCOSE 99 05/20/2015 1116   BUN 29 (H) 10/15/2015 0816   CREATININE 0.95 10/15/2015 0816   CREATININE 0.83 04/03/2014 1556   CALCIUM 10.1 10/15/2015 0816   PROT 6.3 10/15/2015 0816   ALBUMIN 4.1 10/15/2015 0816   AST 30 10/15/2015 0816   ALT 24 10/15/2015 0816   ALKPHOS 58 10/15/2015 0816   BILITOT 0.4 10/15/2015 0816   GFRNONAA 55 (L) 10/15/2015 0816   GFRAA 64 10/15/2015 0816    PATHOLOGY:       RADIOGRAPHIC STUDIES: I have personally reviewed the radiological images as listed and agreed with the findings in the report. Study Result   CLINICAL DATA:  Patient has a history of malignant lumpectomy of the right breast in March of 2017 followed by radiation therapy. She has developed erythema of her right breast with mild tenderness. She is on antibiotic treatment for probable mastitis with  improvement in the erythema. She also gives a history of a recent tick bite inferior to her right breast.  EXAM: ULTRASOUND OF THE RIGHT BREAST  COMPARISON:  Previous exam(s).  FINDINGS: On physical exam, there is diffuse erythema in a periareolar distribution with mild diffuse skin thickening. There is no discrete palpable abnormality.  Targeted ultrasound  is performed, showing mild skin thickening with the skin thickness measuring approximately 4 mm. There is no evidence for a breast abscess.  IMPRESSION: Changes consistent with mastitis which per patient is improving on antibiotic treatment. No evidence for a breast abscess.  RECOMMENDATION: Follow-up clinically for mastitis. If the changes of the mastitis do not resolve, skin punch biopsy may be indicated.  I have discussed the findings and recommendations with the patient. Results were also provided in writing at the conclusion of the visit. If applicable, a reminder letter will be sent to the patient regarding the next appointment.  BI-RADS CATEGORY  3: Probably benign.   Electronically Signed   By: Altamese Cabal M.D.   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 Dalbert Batman on 05/01/2015 R partial mastectomy with radioactive seed localization 05/23/2015 Dr. Dalbert Batman Adjuvant XRT DEXA 07/2015 with osteopenia Chronic dermatitis R inner breast Hot flashes related to AI use  She has completed XRT. She is on arimidex. She was having nausea but feels this is overall better. She had hot flashes prior to the medication and notes that they are worse but tolerable.   We discussed her  osteopenia.We have previously discussed the use of prolia or bisphosphonates with AI therapy. She will continue to think on this. She is active, she does not take calcium or vitamin D for a history of hypercalcemia. We will try to get another bone density scan in a year. Her last bone density scan was performed 08/01/2015. We can also consider a urine NTX.   I will plan on seeing her back in 3 months.   8/30 Pathology of right inner quadrant showed chronic dermatitis with no evidence of Paget's disease, invasive carcinoma or intralymphatic neoplasm. She is following with a dermatologist for this.  Her last mammogram was performed in March 2017. Dr. Wolfgang Phoenix has already put in an order for her diagnostic screening mammogram.  She recently had a breast exam with her gynecologist on 12/22/2015.  She does not need any refills at this time.  She will return for follow up in 3 months. At this next visit, a breast exam will be performed.   All questions were answered. The patient knows to call the clinic with any problems, questions or concerns.  This document serves as a record of services personally performed by Ancil Linsey, MD. It was created on her behalf by Arlyce Harman, a trained medical scribe. The creation of this record is based on the scribe's personal observations and the provider's statements to them. This document has been checked and approved by the attending provider.  I have reviewed the above documentation for accuracy and completeness, and I agree with the above.  This note was electronically signed.    Molli Hazard, MD  01/17/2016 4:15 PM

## 2015-12-24 NOTE — Patient Instructions (Addendum)
[facility] at [facility] Discharge Instructions  RECOMMENDATIONS MADE BY THE CONSULTANT AND ANY TEST RESULTS WILL BE SENT TO YOUR REFERRING PHYSICIAN.  You saw Dr. Penland today. Follow up in 3 months.  Thank you for choosing [facility] at [facility] to provide your oncology and hematology care.  To afford each patient quality time with our provider, please arrive at least 15 minutes before your scheduled appointment time.   Beginning January 23rd 2017 lab work for the Cancer Center will be done in the  Main lab at [facility] on 1st floor. If you have a lab appointment with the Cancer Center please come in thru the  Main Entrance and check in at the main information desk  You need to re-schedule your appointment should you arrive 10 or more minutes late.  We strive to give you quality time with our providers, and arriving late affects you and other patients whose appointments are after yours.  Also, if you no show three or more times for appointments you may be dismissed from the clinic at the providers discretion.     Again, thank you for choosing [facility].  Our hope is that these requests will decrease the amount of time that you wait before being seen by our physicians.       _____________________________________________________________  Should you have questions after your visit to [facility], please contact our office at [phone] between the hours of 8:30 a.m. and 4:30 p.m.  Voicemails left after 4:30 p.m. will not be returned until the following business day.  For prescription refill requests, have your pharmacy contact our office.         Resources For Cancer Patients and their Caregivers ? American Cancer Society: Can assist with transportation, wigs, general needs, runs Look Good Feel Better.        1-888-227-6333 ? Cancer Care: Provides financial assistance, online support groups,  medication/co-pay assistance.  1-800-813-HOPE (4673) ? [facility] Assists [place] cancer patients and their families through emotional , educational and financial support.  336-427-4357 ? [place] DSS Where to apply for food stamps, Medicaid and utility assistance. 336-342-1394 ? RCATS: Transportation to medical appointments. 336-347-2287 ? Social Security Administration: May apply for disability if have a Stage IV cancer. 336-342-7796 1-800-772-1213 ? [place] Aging, Disability and Transit Services: Assists with nutrition, care and transit needs. 336-349-2343  Cancer Center Support Programs: @10RELATIVEDAYS@ > Cancer Support Group  2nd Tuesday of the month 1pm-2pm, Journey Room  > Creative Journey  3rd Tuesday of the month 1130am-1pm, Journey Room  > Look Good Feel Better  1st Wednesday of the month 10am-12 noon, Journey Room (Call American Cancer Society to register 1-800-395-5775)    

## 2015-12-25 DIAGNOSIS — J449 Chronic obstructive pulmonary disease, unspecified: Secondary | ICD-10-CM | POA: Diagnosis not present

## 2015-12-25 DIAGNOSIS — G5793 Unspecified mononeuropathy of bilateral lower limbs: Secondary | ICD-10-CM | POA: Diagnosis not present

## 2015-12-25 DIAGNOSIS — Z9889 Other specified postprocedural states: Secondary | ICD-10-CM | POA: Diagnosis not present

## 2015-12-25 DIAGNOSIS — C50111 Malignant neoplasm of central portion of right female breast: Secondary | ICD-10-CM | POA: Diagnosis not present

## 2015-12-25 DIAGNOSIS — I10 Essential (primary) hypertension: Secondary | ICD-10-CM | POA: Diagnosis not present

## 2015-12-30 DIAGNOSIS — L308 Other specified dermatitis: Secondary | ICD-10-CM | POA: Diagnosis not present

## 2016-01-17 ENCOUNTER — Encounter (HOSPITAL_COMMUNITY): Payer: Self-pay | Admitting: Hematology & Oncology

## 2016-02-09 DIAGNOSIS — L910 Hypertrophic scar: Secondary | ICD-10-CM | POA: Diagnosis not present

## 2016-02-09 DIAGNOSIS — L249 Irritant contact dermatitis, unspecified cause: Secondary | ICD-10-CM | POA: Diagnosis not present

## 2016-02-17 DIAGNOSIS — M170 Bilateral primary osteoarthritis of knee: Secondary | ICD-10-CM | POA: Diagnosis not present

## 2016-02-17 DIAGNOSIS — M25561 Pain in right knee: Secondary | ICD-10-CM | POA: Diagnosis not present

## 2016-02-17 DIAGNOSIS — M25562 Pain in left knee: Secondary | ICD-10-CM | POA: Diagnosis not present

## 2016-02-18 ENCOUNTER — Other Ambulatory Visit: Payer: Self-pay | Admitting: Family Medicine

## 2016-02-24 DIAGNOSIS — G629 Polyneuropathy, unspecified: Secondary | ICD-10-CM | POA: Diagnosis not present

## 2016-02-25 DIAGNOSIS — M25562 Pain in left knee: Secondary | ICD-10-CM | POA: Diagnosis not present

## 2016-02-25 DIAGNOSIS — M25561 Pain in right knee: Secondary | ICD-10-CM | POA: Diagnosis not present

## 2016-02-25 DIAGNOSIS — M170 Bilateral primary osteoarthritis of knee: Secondary | ICD-10-CM | POA: Diagnosis not present

## 2016-03-01 DIAGNOSIS — M25561 Pain in right knee: Secondary | ICD-10-CM | POA: Diagnosis not present

## 2016-03-01 DIAGNOSIS — M1711 Unilateral primary osteoarthritis, right knee: Secondary | ICD-10-CM | POA: Diagnosis not present

## 2016-03-04 DIAGNOSIS — M1712 Unilateral primary osteoarthritis, left knee: Secondary | ICD-10-CM | POA: Diagnosis not present

## 2016-03-04 DIAGNOSIS — M25562 Pain in left knee: Secondary | ICD-10-CM | POA: Diagnosis not present

## 2016-03-09 DIAGNOSIS — M1711 Unilateral primary osteoarthritis, right knee: Secondary | ICD-10-CM | POA: Diagnosis not present

## 2016-03-09 DIAGNOSIS — M25561 Pain in right knee: Secondary | ICD-10-CM | POA: Diagnosis not present

## 2016-03-11 DIAGNOSIS — M1712 Unilateral primary osteoarthritis, left knee: Secondary | ICD-10-CM | POA: Diagnosis not present

## 2016-03-11 DIAGNOSIS — M25562 Pain in left knee: Secondary | ICD-10-CM | POA: Diagnosis not present

## 2016-03-16 DIAGNOSIS — M25561 Pain in right knee: Secondary | ICD-10-CM | POA: Diagnosis not present

## 2016-03-16 DIAGNOSIS — M1711 Unilateral primary osteoarthritis, right knee: Secondary | ICD-10-CM | POA: Diagnosis not present

## 2016-03-18 DIAGNOSIS — M1712 Unilateral primary osteoarthritis, left knee: Secondary | ICD-10-CM | POA: Diagnosis not present

## 2016-03-18 DIAGNOSIS — M25562 Pain in left knee: Secondary | ICD-10-CM | POA: Diagnosis not present

## 2016-03-19 ENCOUNTER — Telehealth (INDEPENDENT_AMBULATORY_CARE_PROVIDER_SITE_OTHER): Payer: Self-pay | Admitting: *Deleted

## 2016-03-19 NOTE — Telephone Encounter (Signed)
Patient is on recall  ofr 5 yr TCS, since she is 74 should I schedule, please advise

## 2016-03-22 ENCOUNTER — Other Ambulatory Visit (HOSPITAL_COMMUNITY): Payer: Self-pay | Admitting: Hematology & Oncology

## 2016-03-22 DIAGNOSIS — Z79811 Long term (current) use of aromatase inhibitors: Secondary | ICD-10-CM | POA: Diagnosis not present

## 2016-03-22 DIAGNOSIS — Z79899 Other long term (current) drug therapy: Secondary | ICD-10-CM

## 2016-03-22 DIAGNOSIS — C50411 Malignant neoplasm of upper-outer quadrant of right female breast: Secondary | ICD-10-CM

## 2016-03-22 DIAGNOSIS — Z923 Personal history of irradiation: Secondary | ICD-10-CM | POA: Diagnosis not present

## 2016-03-22 DIAGNOSIS — Z08 Encounter for follow-up examination after completed treatment for malignant neoplasm: Secondary | ICD-10-CM | POA: Diagnosis not present

## 2016-03-22 DIAGNOSIS — M8580 Other specified disorders of bone density and structure, unspecified site: Secondary | ICD-10-CM

## 2016-03-22 DIAGNOSIS — R21 Rash and other nonspecific skin eruption: Secondary | ICD-10-CM | POA: Diagnosis not present

## 2016-03-22 DIAGNOSIS — Z170 Estrogen receptor positive status [ER+]: Secondary | ICD-10-CM | POA: Diagnosis not present

## 2016-03-23 DIAGNOSIS — M25562 Pain in left knee: Secondary | ICD-10-CM | POA: Diagnosis not present

## 2016-03-23 DIAGNOSIS — M25561 Pain in right knee: Secondary | ICD-10-CM | POA: Diagnosis not present

## 2016-03-23 DIAGNOSIS — M170 Bilateral primary osteoarthritis of knee: Secondary | ICD-10-CM | POA: Diagnosis not present

## 2016-03-25 ENCOUNTER — Telehealth: Payer: Self-pay | Admitting: Family Medicine

## 2016-03-25 ENCOUNTER — Encounter (HOSPITAL_COMMUNITY): Payer: Self-pay | Admitting: Oncology

## 2016-03-25 ENCOUNTER — Encounter (HOSPITAL_COMMUNITY): Payer: Medicare Other | Attending: Hematology & Oncology | Admitting: Oncology

## 2016-03-25 VITALS — BP 133/55 | HR 92 | Temp 97.5°F | Resp 16 | Wt 176.8 lb

## 2016-03-25 DIAGNOSIS — M8580 Other specified disorders of bone density and structure, unspecified site: Secondary | ICD-10-CM | POA: Insufficient documentation

## 2016-03-25 DIAGNOSIS — Z79899 Other long term (current) drug therapy: Secondary | ICD-10-CM | POA: Insufficient documentation

## 2016-03-25 DIAGNOSIS — Z170 Estrogen receptor positive status [ER+]: Secondary | ICD-10-CM | POA: Diagnosis not present

## 2016-03-25 DIAGNOSIS — M85852 Other specified disorders of bone density and structure, left thigh: Secondary | ICD-10-CM | POA: Diagnosis not present

## 2016-03-25 DIAGNOSIS — C50411 Malignant neoplasm of upper-outer quadrant of right female breast: Secondary | ICD-10-CM

## 2016-03-25 DIAGNOSIS — R739 Hyperglycemia, unspecified: Secondary | ICD-10-CM

## 2016-03-25 DIAGNOSIS — Z780 Asymptomatic menopausal state: Secondary | ICD-10-CM | POA: Diagnosis not present

## 2016-03-25 DIAGNOSIS — E785 Hyperlipidemia, unspecified: Secondary | ICD-10-CM

## 2016-03-25 NOTE — Assessment & Plan Note (Addendum)
Stage IB (pT1bN0M0) right breast cancer, ER POSITIVE, HER2 NEGATIVE, S/P right lumpectomy by Dr. Dalbert Batman on 05/23/2015 followed by XRT by Dr. Pablo Ledger finishing on 07/23/2015, and now on Arimidex beginning on 08/14/2015.  Oncology history developed.  Staging in [facility] problem list completed.  Will coordinate lab work with Dr. Wolfgang Phoenix as she typically has her annual physical and labs performed in Feb.  From an oncology standpoint, we are interested in her CBC diff, CMET, and Vit D.  In an attempt to avoid multiple venipuncture, I have messaged Dr. Wolfgang Phoenix and see how we can best coordinate labs.  Patient is agreeable to this plan.  She will continue with Arimidex.  Mammogram is due in March 2018.  Order is placed for diagnostic mammogram.  Return in 3 months for follow-up.

## 2016-03-25 NOTE — Telephone Encounter (Signed)
I called patient but there is no answer. Ann, please call patient and ask if she wants [place] to proceed with colonoscopy or consider exam only if she's have any symptoms I am okay with not doing procedure.

## 2016-03-25 NOTE — Assessment & Plan Note (Addendum)
Osteopenia in the setting of AI use, currently on Ca++, Vit D.  Bone density in May 2017 demonstrates osteopenia.    We have discussed Prolia intervention previously.  I have reviewed the risks, benefits, alternatives, and side effects of this therapy including hypocalcemia and ONJ.  Plan is to repeat bone density this year and if results are worse, then she will need to consider Prolia/Fosamax therapy.  Will repeat bone density in May 2018.

## 2016-03-25 NOTE — Patient Instructions (Addendum)
[place] at [facility] [place] Discharge Instructions  RECOMMENDATIONS MADE BY THE CONSULTANT AND ANY TEST RESULTS WILL BE SENT TO YOUR REFERRING PHYSICIAN.  You saw Kirby Crigler, PA-C, today.  Mammogram in March 2018 Bone Density scan in May 2018 Follow up in 3 months. See Amy at checkout for appointments.  Thank you for choosing [place] at [facility] to provide your oncology and hematology care.  To afford each patient quality time with our provider, please arrive at least 15 minutes before your scheduled appointment time.    If you have a lab appointment with the [facility] please come in thru the  Main Entrance and check in at the main information desk  You need to re-schedule your appointment should you arrive 10 or more minutes late.  We strive to give you quality time with our providers, and arriving late affects you and other patients whose appointments are after yours.  Also, if you no show three or more times for appointments you may be dismissed from the clinic at the providers discretion.     Again, thank you for choosing [facility].  Our hope is that these requests will decrease the amount of time that you wait before being seen by our physicians.       _____________________________________________________________  Should you have questions after your visit to [facility], please contact our office at [phone] between the hours of 8:30 a.m. and 4:30 p.m.  Voicemails left after 4:30 p.m. will not be returned until the following business day.  For prescription refill requests, have your pharmacy contact our office.       Resources For Cancer Patients and their Caregivers ? American Cancer Society: Can assist with transportation, wigs, general needs, runs Look Good Feel Better.        9066578514 ? Cancer Care: Provides financial assistance, online support groups, medication/co-pay assistance.   1-800-813-HOPE 720-713-2688) ? [facility] Assists [place] cancer patients and their families through emotional , educational and financial support.  (808)797-7598 ? [place] DSS Where to apply for food stamps, Medicaid and utility assistance. 639-560-6470 ? RCATS: Transportation to medical appointments. 562-623-5784 ? Social Security Administration: May apply for disability if have a Stage IV cancer. (606)491-0091 567-563-7409 ? [employer], Disability and Transit Services: Assists with nutrition, care and transit needs. [place] Support Programs: @10RELATIVEDAYS @ > Cancer Support Group  2nd Tuesday of the month 1pm-2pm, Journey Room  > Creative Journey  3rd Tuesday of the month 1130am-1pm, Journey Room  > Look Good Feel Better  1st Wednesday of the month 10am-12 noon, Journey Room (Call [place] to register [phone])

## 2016-03-25 NOTE — Progress Notes (Signed)
Lauren Lange, MD [place] [address]  Malignant neoplasm of upper-outer quadrant of right breast in female, estrogen receptor positive ([place]) - Plan: CBC with Differential, Comprehensive metabolic panel, MM DIAG BREAST TOMO BILATERAL  Osteopenia of left thigh - Plan: DG Bone Density  Post-menopausal - Plan: DG Bone Density  CURRENT THERAPY: Arimidex daily beginning in June 2017  INTERVAL HISTORY: [name] 83 y.o. female returns for followup of Stage IB (pT1bN0M0) right breast cancer, ER POSITIVE, HER2 NEGATIVE, S/P right lumpectomy by Dr. Dalbert Batman on 05/23/2015 followed by XRT by Dr. Pablo Ledger finishing on 07/23/2015, and now on Arimidex beginning on 08/14/2015. AND Osteopenia on bone density exam in may 2017, on Ca++ and Vit D.    Breast cancer, right breast ([place])   04/22/2015 Procedure    Right breast needle core biopsy      04/24/2015 Pathology Results    Invasive ductal carcinoma, grade 1.  ER100%, PR NEGATIVE, Ki-67 10%, HER2 NEGATIVE      05/23/2015 Procedure    Right breast lumpectomy by Dr. Dalbert Batman      05/27/2015 Pathology Results    Breast, lumpectomy, Right - INVASIVE GRADE I DUCTAL CARCINOMA SPANNING 0.7 CM IN GREATEST LINEAR DIMENSION. - ASSOCIATED LOW GRADE DUCTAL CARCINOMA IN SITU. - TUMOR SHOWS ASSOCIATED CALCIFICATIONS. - LOBULAR CARCINOMA IN SITU WITH ASSOCIATED CALCIFICATIONS. - MARGINS ARE NEGATIVE.       - 07/23/2015 Radiation Therapy    Dr. Pablo Ledger      08/01/2015 Imaging    Bone density- BMD as determined from Femur Neck Left is 0.891 g/cm2 with a T-Score of -1.1. This patient is considered osteopenic according to [facility]) criteria.      08/14/2015 -  Anti-estrogen oral therapy    Arimidex      "Should I keep taking these cancer pills?  It says I will take this for 5 years.  Have you seen my age?!  I will be 29 in 5 years.  You don't think I will be around that long do you?"    She  is tolerating anti-endocrine therapy well without any major side effects.  She does note some hot flashes, but these are self-limiting only.    Given her performance status and being highly functional, she is advised that it would be in her best interest to continue Arimidex, BUT she is free to stop if she wishes.  As long as she is tolerating therapy well, it certainly is reasonable to continue.  She recently saw Dr. Dalbert Batman and his note is reviewed.  She sings Dr. Darrel Hoover praise today.  She notes that she is due for annual labs that are performed by Dr. Wolfgang Phoenix.  I have offered to perform labs today, but she notes that she has not fasted.    Review of Systems  Constitutional: Negative.  Negative for chills, fever and weight loss.  HENT: Negative.   Eyes: Negative.   Respiratory: Negative.   Cardiovascular: Negative.   Gastrointestinal: Negative.  Negative for abdominal pain, constipation, diarrhea, nausea and vomiting.  Genitourinary: Negative.   Musculoskeletal: Negative.   Skin: Negative.   Neurological: Negative.  Negative for weakness.  Endo/Heme/Allergies: Negative.   Psychiatric/Behavioral: Negative.     Past Medical History:  Diagnosis Date  . Breast cancer, right breast ([place]) 05/01/2015  . Cancer (H[facility])    BREAST CANCER   . Colon polyps   . COPD (chronic obstructive pulmonary disease) ([place])  DR. Lake Bells  . Diabetes mellitus without complication ([place])    PREDIABETIC  CONTROL W/ DIET  . Hypercholesteremia   . Hypertension   . Lung nodules   . Osteoarthritis   . Osteopenia 09/18/2013  . Osteoporosis   . PONV (postoperative nausea and vomiting)   . Shortness of breath dyspnea    W/ EXERTION    Past Surgical History:  Procedure Laterality Date  . ABDOMINAL HYSTERECTOMY    . APPENDECTOMY    . BARTHOLIN GLAND CYST EXCISION    . BREAST LUMPECTOMY WITH RADIOACTIVE SEED LOCALIZATION Right 05/23/2015   Procedure: RIGHT BREAST LUMPECTOMY WITH RADIOACTIVE SEED LOCALIZATION;   Surgeon: Fanny Skates, MD;  Location: [facility];  Service: General;  Laterality: Right;  . BREAST SURGERY     2000  LEFT BREAST  LUMP REMOVED  . colonoscopy    . COLONOSCOPY  03/25/2011   Procedure: COLONOSCOPY;  Surgeon: Rogene Houston, MD;  [facility];  Service: Endoscopy;  Laterality: N/A;  9:30 am  . left inguinal hernia repair    . multiple toe surgeries    . TONSILLECTOMY      Family History  Problem Relation Age of Onset  . Colon cancer Father     Social History   Social History  . Marital status: Widowed    Spouse name: N/A  . Number of children: N/A  . Years of education: N/A   Social History Main Topics  . Smoking status: Former Smoker    Packs/day: 2.00    Years: 50.00    Types: Cigarettes    Quit date: 03/15/1988  . Smokeless tobacco: Never Used  . Alcohol use No  . Drug use: No  . Sexual activity: Not on file   Other Topics Concern  . Not on file   Social History Narrative  . No narrative on file     PHYSICAL EXAMINATION  ECOG PERFORMANCE STATUS: 1 - Symptomatic but completely ambulatory  Vitals:   03/25/16 1107  BP: (!) 133/55  Pulse: 92  Resp: 16  Temp: 97.5 F (36.4 C)    GENERAL:alert, well nourished, well developed, comfortable, cooperative, obese, smiling and unaccompanied SKIN: skin color, texture, turgor are normal, no rashes or significant lesions HEAD: Normocephalic, No masses, lesions, tenderness or abnormalities EYES: normal, EOMI, Conjunctiva are pink and non-injected EARS: External ears normal OROPHARYNX:lips, buccal mucosa, and tongue normal and mucous membranes are moist  NECK: supple, trachea midline LYMPH:  no palpable lymphadenopathy BREAST:not examined LUNGS: clear to auscultation and percussion HEART: regular rate & rhythm, no murmurs, no gallops, S1 normal and S2 normal ABDOMEN:abdomen soft, non-tender and normal bowel sounds BACK: Back symmetric, no curvature. EXTREMITIES:less then 2 second capillary  refill, no joint deformities, effusion, or inflammation, no skin discoloration, no cyanosis  NEURO: alert & oriented x 3 with fluent speech, no focal motor/sensory deficits, gait normal   LABORATORY DATA: CBC    Component Value Date/Time   WBC 5.5 05/20/2015 1116   RBC 4.04 05/20/2015 1116   HGB 12.4 05/20/2015 1116   HCT 38.6 05/20/2015 1116   HCT 40.4 10/11/2014 0804   PLT 248 05/20/2015 1116   PLT 290 10/11/2014 0804   MCV 95.5 05/20/2015 1116   MCV 92 10/11/2014 0804   MCH 30.7 05/20/2015 1116   MCHC 32.1 05/20/2015 1116   RDW 13.1 05/20/2015 1116   RDW 13.5 10/11/2014 0804   LYMPHSABS 1.8 10/11/2014 0804   EOSABS 0.1 10/11/2014 0804   BASOSABS 0.0 10/11/2014  0804      Chemistry      Component Value Date/Time   NA 139 10/15/2015 0816   K 4.7 10/15/2015 0816   CL 99 10/15/2015 0816   CO2 27 10/15/2015 0816   BUN 29 (H) 10/15/2015 0816   CREATININE 0.95 10/15/2015 0816   CREATININE 0.83 04/03/2014 1556      Component Value Date/Time   CALCIUM 10.1 10/15/2015 0816   ALKPHOS 58 10/15/2015 0816   AST 30 10/15/2015 0816   ALT 24 10/15/2015 0816   BILITOT 0.4 10/15/2015 0816        PENDING LABS:   RADIOGRAPHIC STUDIES:  No results found.   PATHOLOGY:    ASSESSMENT AND PLAN:  Breast cancer, right breast (HCC) Stage IB (pT1bN0M0) right breast cancer, ER POSITIVE, HER2 NEGATIVE, S/P right lumpectomy by Dr. Dalbert Batman on 05/23/2015 followed by XRT by Dr. Pablo Ledger finishing on 07/23/2015, and now on Arimidex beginning on 08/14/2015.  Oncology history developed.  Staging in [facility] problem list completed.  Will coordinate lab work with Dr. Wolfgang Phoenix as she typically has her annual physical and labs performed in Feb.  From an oncology standpoint, we are interested in her CBC diff, CMET, and Vit D.  In an attempt to avoid multiple venipuncture, I have messaged Dr. Wolfgang Phoenix and see how we can best coordinate labs.  Patient is agreeable to this plan.  She will continue with  Arimidex.  Mammogram is due in March 2018.  Order is placed for diagnostic mammogram.  Return in 3 months for follow-up.  Osteopenia Osteopenia in the setting of AI use, currently on Ca++, Vit D.  Bone density in May 2017 demonstrates osteopenia.    We have discussed Prolia intervention previously.  I have reviewed the risks, benefits, alternatives, and side effects of this therapy including hypocalcemia and ONJ.  Plan is to repeat bone density this year and if results are worse, then she will need to consider Prolia/Fosamax therapy.  Will repeat bone density in May 2018.   ORDERS PLACED FOR THIS ENCOUNTER: Orders Placed This Encounter  Procedures  . MM DIAG BREAST TOMO BILATERAL  . DG Bone Density  . CBC with Differential  . Comprehensive metabolic panel    MEDICATIONS PRESCRIBED THIS ENCOUNTER: No orders of the defined types were placed in this encounter.   THERAPY PLAN:  Continue Arimidex daily.  NCCN guidelines recommends the following surveillance for invasive breast cancer (2.2017):  A. History and Physical exam 1-4 times per year as clinically appropriate for 5 years, then annually.  B. Periodic screening for changes in family history and referral to genetics counseling as indicated  C. Educate, monitor, and refer to lymphedema management.  D. Mammography every 12 months  E. Routine imaging of reconstructed breast is not indicated.  F. In the absence of clinical signs and symptoms suggestive of recurrent disease, there is no indication for laboratory or imaging studies for metastases screening.  G. Women on Tamoxifen: annual gynecologic assessment every 12 months if uterus is present.  H. Women on aromatase inhibitor or who experience ovarian failure secondary to treatment should have monitoring of bone health with a bone mineral density determination at baseline and periodically thereafter.  I. Assess and encourage adherence to adjuvant endocrine therapy.  J.  Evidence suggests that active lifestyle, healthy diet, limited alcohol intake, and achieving and maintaining an ideal body weight (20-25 BMI) may lead to optimal breast cancer outcomes.   All questions were answered. The patient knows to call the  clinic with any problems, questions or concerns. We can certainly see the patient much sooner if necessary.  Patient and plan discussed with Dr. Ancil Linsey and she is in agreement with the aforementioned.   This note is electronically signed by: Robynn Pane, PA-C 03/25/2016 6:01 PM

## 2016-03-25 NOTE — Telephone Encounter (Signed)
Please let the patient know that Kirby Crigler PA at the cancer center contacted [place] regarding blood work he has ordered.  Also have a order for A1c and lipid. She can do all of this blo from the end of January through the end of Februa-Please make sure the pat this at the same facility that the specialists ordered theirs. The patient should do a follow-up office visit in March

## 2016-03-26 ENCOUNTER — Telehealth (HOSPITAL_COMMUNITY): Payer: Self-pay | Admitting: Emergency Medicine

## 2016-03-26 NOTE — Telephone Encounter (Signed)
Called [name] to let her know that it was ok to have her lab work with Dr Wolfgang Phoenix late [employer] or early feb.  She verbalized understanding.

## 2016-03-26 NOTE — Telephone Encounter (Signed)
I spoke to patient and advised her of recommendations. She states she will have labs drawn at [facility] in out building.  I advised her to be sure to speak to Kirby Crigler' office so she will have orders in hand or they will be available at [facility] to avoid multiple sticks.  Orders for [place] are entered.  I also tried to see what lab [facility] sent the orders too, that information was not available to me. Patient voiced understanding and agreed with plan.

## 2016-03-26 NOTE — Addendum Note (Signed)
Addended by: Dorothyann Gibbs on: 03/26/2016 10:39 AM   Modules accepted: Orders

## 2016-04-09 DIAGNOSIS — E785 Hyperlipidemia, unspecified: Secondary | ICD-10-CM | POA: Diagnosis not present

## 2016-04-09 DIAGNOSIS — R739 Hyperglycemia, unspecified: Secondary | ICD-10-CM | POA: Diagnosis not present

## 2016-04-10 LAB — LIPID PANEL
CHOLESTEROL TOTAL: 234 mg/dL — AB (ref 100–199)
Chol/HDL Ratio: 2.9 ratio units (ref 0.0–4.4)
HDL: 80 mg/dL (ref >39)
LDL CALC: 132 mg/dL — AB (ref 0–99)
TRIGLYCERIDES: 111 mg/dL (ref 0–149)
VLDL Cholesterol Cal: 22 mg/dL (ref 5–40)

## 2016-04-10 LAB — HEMOGLOBIN A1C
Est. average glucose Bld gHb Est-mCnc: 123 mg/dL
Hgb A1c MFr Bld: 5.9 % — ABNORMAL HIGH (ref 4.8–5.6)

## 2016-04-26 ENCOUNTER — Ambulatory Visit (INDEPENDENT_AMBULATORY_CARE_PROVIDER_SITE_OTHER): Payer: Medicare Other | Admitting: Family Medicine

## 2016-04-26 ENCOUNTER — Encounter: Payer: Self-pay | Admitting: Family Medicine

## 2016-04-26 ENCOUNTER — Other Ambulatory Visit (HOSPITAL_COMMUNITY): Payer: Self-pay | Admitting: Hematology & Oncology

## 2016-04-26 VITALS — BP 136/72 | Ht 62.0 in | Wt 175.4 lb

## 2016-04-26 DIAGNOSIS — Z0000 Encounter for general adult medical examination without abnormal findings: Secondary | ICD-10-CM | POA: Diagnosis not present

## 2016-04-26 DIAGNOSIS — J449 Chronic obstructive pulmonary disease, unspecified: Secondary | ICD-10-CM | POA: Insufficient documentation

## 2016-04-26 DIAGNOSIS — I779 Disorder of arteries and arterioles, unspecified: Secondary | ICD-10-CM

## 2016-04-26 DIAGNOSIS — E785 Hyperlipidemia, unspecified: Secondary | ICD-10-CM

## 2016-04-26 DIAGNOSIS — I739 Peripheral vascular disease, unspecified: Secondary | ICD-10-CM

## 2016-04-26 DIAGNOSIS — I10 Essential (primary) hypertension: Secondary | ICD-10-CM | POA: Diagnosis not present

## 2016-04-26 DIAGNOSIS — Z79899 Other long term (current) drug therapy: Secondary | ICD-10-CM

## 2016-04-26 DIAGNOSIS — M8580 Other specified disorders of bone density and structure, unspecified site: Secondary | ICD-10-CM

## 2016-04-26 DIAGNOSIS — C50411 Malignant neoplasm of upper-outer quadrant of right female breast: Secondary | ICD-10-CM

## 2016-04-26 MED ORDER — FUROSEMIDE 20 MG PO TABS: ORAL_TABLET | ORAL | 1 refills | Status: DC

## 2016-04-26 MED ORDER — LISINOPRIL 20 MG PO TABS: 20.0000 mg | ORAL_TABLET | Freq: Every day | ORAL | 1 refills | Status: DC

## 2016-04-26 NOTE — Progress Notes (Signed)
Subjective:    Patient ID: [name], female    DOB: 1929-01-15, 83 y.o.   MRN: [id number]  HPI AWV- Annual Wellness Visit  The patient was seen for their annual wellness visit. The patient's past medical history, surgical history, and family history were reviewed. Pertinent vaccines were reviewed ( tetanus, pneumonia, shingles, flu) The patient's medication list was reviewed and updated.  The height and weight were entered. The patient's current BMI is:32  Cognitive screening was completed. Outcome of Mini - Cog: Pass  Falls within the past 6 months:None Current tobacco usage: None  (All patients who use tobacco were given written and verbal information on quitting)  Recent listing of emergency department/hospitalizations over the past year were reviewed.  current specialist the patient sees on a regular basis: Sees cancer doctor on a regular basis for follow-ups   Medicare annual wellness visit patient questionnaire was reviewed.  A written screening schedule for the patient for the next 5-10 years was given. Appropriate discussion of followup regarding next visit was discussed.  Patient has concerns of possible growth to right thumb.  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 She denies high fever chills sweats vomiting hematuria dysuria rectal bleeding    Objective:   Physical Exam  Constitutional: She is oriented to person, place, and time. She  appears well-developed and well-nourished.  HENT:  Head: Normocephalic.  Right Ear: External ear normal.  Left Ear: External ear normal.  Eyes: Pupils are equal, round, and reactive to light.  Neck: Normal range of motion. No thyromegaly present.  Cardiovascular: Normal rate, regular rhythm, normal heart sounds and intact distal pulses.   No murmur heard. Pulmonary/Chest: Effort normal and breath sounds normal. No respiratory distress. She has no wheezes.  Abdominal: Soft. Bowel sounds are normal. She exhibits no distension and no mass. There is no tenderness.  Genitourinary: Vagina normal.  Genitourinary Comments: Her pelvic exam is normal Pap smear not indicated  Musculoskeletal: Normal range of motion. She exhibits no edema or tenderness.  Lymphadenopathy:    She has no cervical adenopathy.  Neurological: She is alert and oriented to person, place, and time. She exhibits normal muscle tone.  Skin: Skin is warm and dry.  Psychiatric: She has a normal mood and affect. Her behavior is normal.          Assessment & Plan:  Adult wellness-complete.wellness physical was conducted today. Importance of diet and exercise were discussed in detail. In addition to this a discussion regarding safety was also covered. We also reviewed over immunizations and gave recommendations regarding current immunization needed for age. In addition to this additional areas were also touched on including: Preventative health exams needed: Colonoscopy Not indicated  Patient was advised yearly wellness exam  Prediabetes watch diet closely. Stay active. Follow-up A1c in 6 months  Hyperlipidemia LDL slightly elevated but I do not recommend statin. HDL is excellent  Carotid artery disease will do a follow-up ultrasound this spring  Breast  cancer follow-up with cancer Center  Patient has a small cyst on the right thumb patient defers on referral to orthopedics currently. She will let us know if he gets  worse  Follow-up here 6 months

## 2016-05-04 ENCOUNTER — Telehealth: Payer: Self-pay | Admitting: Pulmonary Disease

## 2016-05-04 ENCOUNTER — Ambulatory Visit (INDEPENDENT_AMBULATORY_CARE_PROVIDER_SITE_OTHER): Payer: Medicare Other | Admitting: Pulmonary Disease

## 2016-05-04 ENCOUNTER — Encounter: Payer: Self-pay | Admitting: Pulmonary Disease

## 2016-05-04 ENCOUNTER — Ambulatory Visit (INDEPENDENT_AMBULATORY_CARE_PROVIDER_SITE_OTHER)
Admission: RE | Admit: 2016-05-04 | Discharge: 2016-05-04 | Disposition: A | Discharge status: Home / Self Care | Payer: Medicare Other | Source: Ambulatory Visit | Attending: Pulmonary Disease | Admitting: Pulmonary Disease

## 2016-05-04 VITALS — BP 124/60 | HR 101 | Ht 64.5 in | Wt 174.8 lb

## 2016-05-04 DIAGNOSIS — R0602 Shortness of breath: Secondary | ICD-10-CM

## 2016-05-04 DIAGNOSIS — R0600 Dyspnea, unspecified: Secondary | ICD-10-CM | POA: Diagnosis not present

## 2016-05-04 DIAGNOSIS — R911 Solitary pulmonary nodule: Secondary | ICD-10-CM

## 2016-05-04 MED ORDER — TIOTROPIUM BROMIDE-OLODATEROL 2.5-2.5 MCG/ACT IN AERS: 2.0000 | INHALATION_SPRAY | Freq: Every day | RESPIRATORY_TRACT | 0 refills | Status: DC

## 2016-05-04 NOTE — Assessment & Plan Note (Signed)
This has increased lately.  I believe that this is increased primarily due to her significant weight gain. However, she does have severe COPD so it may be reasonable for her to be on too long acting bronchodilators, see discussion above. The differential diagnosis also includes cardiac disease given her heart murmur and leg swelling.  Plan: Chest x-ray today Trial of Stiolto If no improvement in dyspnea with exercise weight gain and Stiolto then on next visit check pro BNP and echocardiogram

## 2016-05-04 NOTE — Telephone Encounter (Signed)
I called [name] because her Chest X ray showed a small scar in the right lung.  She had no features on exam or history today to suggest pneumonia.  I explained to her we will need to get a CXR when she returns on the next visit.

## 2016-05-04 NOTE — Progress Notes (Signed)
Subjective:    Patient ID: [name], female    DOB: December 11, 1928, 83 y.o.   MRN: [id number]  Synopsis: Referred in 2016 for chronic cough. Cough had resolved by the time she saw me. She been followed by [facility] for chronic cough for many years prior felt to be related to sinus congestion. She also had pulmonary nodules and some pleural calcification which was stable on chest imaging through 2012 there. After I saw her we did simple spirometry testing which showed clear airflow obstruction with an FEV1 of 48% predicted. However, the FVC was low at 1.38 L (54% predicted).  She smoked 2 packs a day for many years.  She was diagnosed with breast cancer in March 2017 and treated with lumpectomy, radiation therapy, and Arimidex  HPI Chief Complaint  Patient presents with  . Follow-up    SOB when she is outside she seems to be more out of breath   [name] says she is limited her activity to avoid the flu.    She notes that she has been more short of breath when outside: > She says that she doesn't feel this way in the house > when she climbs her driveway (up hill) she feels more short of breath  > she attributes some of this to weight gain  > she wonders if the weather has something to do with it > she doesn't wheezing or chest tightness > No chest pain  She says that she has noted some increase in neuropathy symtpoms lately with foot tingling.   Cough: Not bad lately  She also notes increasing leg swellng lately.     Past Medical History:  Diagnosis Date  . Breast cancer, right breast ([place]) 05/01/2015  . Cancer (HCC)    BREAST CANCER   . Colon polyps   . COPD (chronic obstructive pulmonary disease) ([place])    DR. Lake Bells  . Diabetes mellitus without complication ([place])    PREDIABETIC  CONTROL W/ DIET  . Hypercholesteremia   . Hypertension   . Lung nodules   . Osteoarthritis   . Osteopenia 09/18/2013  . Osteoporosis   . PONV (postoperative nausea and vomiting)   .  Shortness of breath dyspnea    W/ EXERTION      Review of Systems  Constitutional: Negative for chills, fatigue and fever.  HENT: Negative for rhinorrhea, sinus pain and sinus pressure.   Respiratory: Positive for shortness of breath. Negative for choking and wheezing.   Cardiovascular: Negative for chest pain, palpitations and leg swelling.       Objective:   Physical Exam Vitals:   05/04/16 0954  BP: 124/60  Pulse: (!) 101  SpO2: 90%  Weight: 174 lb 12.8 oz (79.3 kg)  Height: 5' 4.5" (1.638 m)    RA  Gen: well appearing HENT: OP clear, TM's clear, neck supple PULM: CTA B, normal percussion CV: RRR, systolic murmur RUSB, trace edema GI: BS+, soft, nontender Derm: no cyanosis or rash Psyche: normal mood and affect    CBC    Component Value Date/Time   WBC 5.5 05/20/2015 1116   RBC 4.04 05/20/2015 1116   HGB 12.4 05/20/2015 1116   HCT 38.6 05/20/2015 1116   HCT 40.4 10/11/2014 0804   PLT 248 05/20/2015 1116   PLT 290 10/11/2014 0804   MCV 95.5 05/20/2015 1116   MCV 92 10/11/2014 0804   MCH 30.7 05/20/2015 1116   MCHC 32.1 05/20/2015 1116   RDW 13.1 05/20/2015 1116  RDW 13.5 10/11/2014 0804   LYMPHSABS 1.8 10/11/2014 0804   EOSABS 0.1 10/11/2014 0804   BASOSABS 0.0 10/11/2014 0804   PFT: 2016 spirometry: Clear airflow obstruction,Ratio 63%  FEV1 0.88 L, 48% predicted  Records from her visit with Oncology in January 2018 reviewed for breasth cancer reviewed, she was instructed to continue on Arimidex     Assessment & Plan:  COPD with bronchitis She has severe COPD and has noted an increase in dyspnea recently. See discussion below. However, she has not had a flareup of her COPD lately and she describes no increase in wheezing, chest tightness or mucus production which would be worrisome for worsening COPD.  Plan: Because of her increase in dyspnea we will try Stiolto inset of Spiriva to see if this helps Follow-up 3-4 weeks  Dyspnea This has  increased lately.  I believe that this is increased primarily due to her significant weight gain. However, she does have severe COPD so it may be reasonable for her to be on too long acting bronchodilators, see discussion above. The differential diagnosis also includes cardiac disease given her heart murmur and leg swelling.  Plan: Chest x-ray today Trial of Stiolto If no improvement in dyspnea with exercise weight gain and Stiolto then on next visit check pro BNP and echocardiogram     Current Outpatient Prescriptions:  .  Albuterol Sulfate (PROAIR RESPICLICK) 488 (90 Base) MCG/ACT AEPB, Inhale 1 puff into the lungs every 4 (four) hours as needed (Wheezing, chest tightness)., Disp: 1 each, Rfl: 5 .  anastrozole (ARIMIDEX) 1 MG tablet, TAKE ONE TABLET BY MOUTH ONCE DAILY., Disp: 30 tablet, Rfl: 0 .  Bilberry, Vaccinium myrtillus, (BILBERRY PO), Take 1 Dose by mouth daily. , Disp: , Rfl:  .  fluticasone (FLONASE) 50 MCG/ACT nasal spray, Place 2 sprays into both nostrils daily. Sometimes uses a second dose qhs, Disp: , Rfl:  .  furosemide (LASIX) 20 MG tablet, TAKE (1) TABLET BY MOUTH EACH MORNING., Disp: 90 tablet, Rfl: 1 .  lisinopril (PRINIVIL,ZESTRIL) 20 MG tablet, Take 1 tablet (20 mg total) by mouth daily., Disp: 90 tablet, Rfl: 1 .  Multiple Vitamins-Minerals (CENTRUM SILVER ADULT 50+ PO), Take by mouth., Disp: , Rfl:  .  Multiple Vitamins-Minerals (VISION-VITE PRESERVE PO), Take by mouth., Disp: , Rfl:  .  nortriptyline (PAMELOR) 10 MG capsule, Take 20 mg by mouth at bedtime. Take 2 at night, Disp: , Rfl:  .  polyethylene glycol (MIRALAX / GLYCOLAX) packet, Take 17 g by mouth daily as needed. Constipation, Disp: , Rfl:  .  SPIRIVA RESPIMAT 2.5 MCG/ACT AERS, INHALE 2 PUFFS EACH DAY., Disp: 4 g, Rfl: 5 .  Tiotropium Bromide-Olodaterol (STIOLTO RESPIMAT) 2.5-2.5 MCG/ACT AERS, Inhale 2 puffs into the lungs daily., Disp: 2 Inhaler, Rfl: 0

## 2016-05-04 NOTE — Patient Instructions (Signed)
Stop Spiriva Take Stiolto 2 puffs in the morning no matter how you feel Exercise regularly We will see you back in 3-4 weeks with our nurse practitioner or myself to see how you're doing with [facility].

## 2016-05-04 NOTE — Addendum Note (Signed)
Addended by: Len Blalock on: 05/04/2016 05:13 PM   Modules accepted: Orders

## 2016-05-04 NOTE — Telephone Encounter (Signed)
cxr ordered.  Nothing further needed.

## 2016-05-04 NOTE — Assessment & Plan Note (Signed)
She has severe COPD and has noted an increase in dyspnea recently. See discussion below. However, she has not had a flareup of her COPD lately and she describes no increase in wheezing, chest tightness or mucus production which would be worrisome for worsening COPD.  Plan: Because of her increase in dyspnea we will try Stiolto inset of Spiriva to see if this helps Follow-up 3-4 weeks

## 2016-05-10 ENCOUNTER — Other Ambulatory Visit (HOSPITAL_COMMUNITY): Payer: Self-pay | Admitting: Oncology

## 2016-05-10 DIAGNOSIS — D235 Other benign neoplasm of skin of trunk: Secondary | ICD-10-CM | POA: Diagnosis not present

## 2016-05-10 DIAGNOSIS — Z9889 Other specified postprocedural states: Secondary | ICD-10-CM

## 2016-05-10 DIAGNOSIS — L821 Other seborrheic keratosis: Secondary | ICD-10-CM | POA: Diagnosis not present

## 2016-05-10 DIAGNOSIS — L249 Irritant contact dermatitis, unspecified cause: Secondary | ICD-10-CM | POA: Diagnosis not present

## 2016-05-10 DIAGNOSIS — L814 Other melanin hyperpigmentation: Secondary | ICD-10-CM | POA: Diagnosis not present

## 2016-05-12 DIAGNOSIS — H40013 Open angle with borderline findings, low risk, bilateral: Secondary | ICD-10-CM | POA: Diagnosis not present

## 2016-05-12 DIAGNOSIS — H353131 Nonexudative age-related macular degeneration, bilateral, early dry stage: Secondary | ICD-10-CM | POA: Diagnosis not present

## 2016-05-12 DIAGNOSIS — H35373 Puckering of macula, bilateral: Secondary | ICD-10-CM | POA: Diagnosis not present

## 2016-05-25 ENCOUNTER — Encounter (HOSPITAL_COMMUNITY): Payer: Medicare Other

## 2016-05-26 ENCOUNTER — Ambulatory Visit: Payer: Medicare Other | Admitting: Pulmonary Disease

## 2016-05-26 ENCOUNTER — Other Ambulatory Visit (HOSPITAL_COMMUNITY): Payer: Self-pay | Admitting: Hematology & Oncology

## 2016-05-26 DIAGNOSIS — C50411 Malignant neoplasm of upper-outer quadrant of right female breast: Secondary | ICD-10-CM

## 2016-05-26 DIAGNOSIS — Z79899 Other long term (current) drug therapy: Secondary | ICD-10-CM

## 2016-05-26 DIAGNOSIS — M8580 Other specified disorders of bone density and structure, unspecified site: Secondary | ICD-10-CM

## 2016-06-07 ENCOUNTER — Telehealth: Payer: Self-pay | Admitting: Pulmonary Disease

## 2016-06-07 MED ORDER — TIOTROPIUM BROMIDE-OLODATEROL 2.5-2.5 MCG/ACT IN AERS: 2.0000 | INHALATION_SPRAY | Freq: Every day | RESPIRATORY_TRACT | 1 refills | Status: DC

## 2016-06-07 NOTE — Telephone Encounter (Signed)
Spoke with pt and she requested the inhaler to be called in. The rx was sent to her pharmacy of choice. Nothing further is needed

## 2016-06-08 ENCOUNTER — Ambulatory Visit (HOSPITAL_COMMUNITY)
Admission: RE | Admit: 2016-06-08 | Discharge: 2016-06-08 | Disposition: A | Discharge status: Home / Self Care | Payer: Medicare Other | Source: Ambulatory Visit | Attending: Oncology | Admitting: Oncology

## 2016-06-08 ENCOUNTER — Ambulatory Visit (HOSPITAL_COMMUNITY)
Admission: RE | Admit: 2016-06-08 | Discharge: 2016-06-08 | Disposition: A | Discharge status: Home / Self Care | Payer: Medicare Other | Source: Ambulatory Visit | Attending: Family Medicine | Admitting: Family Medicine

## 2016-06-08 DIAGNOSIS — Z170 Estrogen receptor positive status [ER+]: Secondary | ICD-10-CM | POA: Diagnosis not present

## 2016-06-08 DIAGNOSIS — C50411 Malignant neoplasm of upper-outer quadrant of right female breast: Secondary | ICD-10-CM | POA: Diagnosis not present

## 2016-06-08 DIAGNOSIS — R928 Other abnormal and inconclusive findings on diagnostic imaging of breast: Secondary | ICD-10-CM | POA: Diagnosis not present

## 2016-06-08 DIAGNOSIS — I779 Disorder of arteries and arterioles, unspecified: Secondary | ICD-10-CM | POA: Insufficient documentation

## 2016-06-08 DIAGNOSIS — I6523 Occlusion and stenosis of bilateral carotid arteries: Secondary | ICD-10-CM | POA: Diagnosis not present

## 2016-06-09 ENCOUNTER — Ambulatory Visit (HOSPITAL_COMMUNITY): Payer: Medicare Other

## 2016-06-11 ENCOUNTER — Encounter: Payer: Self-pay | Admitting: Family Medicine

## 2016-06-11 ENCOUNTER — Ambulatory Visit (INDEPENDENT_AMBULATORY_CARE_PROVIDER_SITE_OTHER): Payer: Medicare Other | Admitting: Family Medicine

## 2016-06-11 ENCOUNTER — Ambulatory Visit (HOSPITAL_COMMUNITY)
Admission: RE | Admit: 2016-06-11 | Discharge: 2016-06-11 | Disposition: A | Discharge status: Home / Self Care | Payer: Medicare Other | Source: Ambulatory Visit | Attending: Family Medicine | Admitting: Family Medicine

## 2016-06-11 VITALS — BP 132/68 | Ht 62.0 in | Wt 177.1 lb

## 2016-06-11 DIAGNOSIS — M19011 Primary osteoarthritis, right shoulder: Secondary | ICD-10-CM | POA: Diagnosis not present

## 2016-06-11 DIAGNOSIS — M25511 Pain in right shoulder: Secondary | ICD-10-CM

## 2016-06-11 DIAGNOSIS — W010XXA Fall on same level from slipping, tripping and stumbling without subsequent striking against object, initial encounter: Secondary | ICD-10-CM | POA: Diagnosis not present

## 2016-06-11 DIAGNOSIS — R0781 Pleurodynia: Secondary | ICD-10-CM | POA: Diagnosis not present

## 2016-06-11 DIAGNOSIS — W19XXXA Unspecified fall, initial encounter: Secondary | ICD-10-CM

## 2016-06-11 NOTE — Progress Notes (Signed)
   Subjective:    Patient ID: [name], female    DOB: 18-Jul-1928, 83 y.o.   MRN: [id number]  Shoulder Pain   The pain is present in the right knee, right shoulder and right arm. This is a new problem. The current episode started in the past 7 days.   Took a fall and fell on right side of shoulder ,  Sore the last fevw days  Hers a little popping and cracking in the shoulder with motion  Very painfulThis at times a deep ache in the shoulder radiating towards the neck other times sharp pain when using arm. Also notes pain lateral part of the chest.    Pos h of br cancer   Patient also has concerns of elevated pulse.     Review of Systems No shortness of breath no cough    Objective:   Physical Exam  Alert vitals stable mild malaise positive impingement sign lungs clear heart regular in rhythm some before meals joint tenderness. Right lateral chest wall pain.        Assessment & Plan:  Impression fall a subsequent injury plan sent for x-rays. Plan addendum x-rays revealed no acute shoulder injury symptom care discussed expect gradual resolution. If shoulder pain persists may need further evaluation for rotator cuff injury.

## 2016-06-15 ENCOUNTER — Encounter (HOSPITAL_COMMUNITY): Payer: Medicare Other

## 2016-06-23 ENCOUNTER — Encounter (HOSPITAL_COMMUNITY): Payer: Self-pay

## 2016-06-23 ENCOUNTER — Encounter (HOSPITAL_COMMUNITY): Payer: Medicare Other | Attending: Oncology | Admitting: Oncology

## 2016-06-23 VITALS — BP 124/53 | HR 85 | Temp 98.0°F | Resp 18 | Wt 175.9 lb

## 2016-06-23 DIAGNOSIS — C50911 Malignant neoplasm of unspecified site of right female breast: Secondary | ICD-10-CM

## 2016-06-23 DIAGNOSIS — Z170 Estrogen receptor positive status [ER+]: Secondary | ICD-10-CM

## 2016-06-23 DIAGNOSIS — Z79811 Long term (current) use of aromatase inhibitors: Secondary | ICD-10-CM | POA: Diagnosis not present

## 2016-06-23 DIAGNOSIS — Z79899 Other long term (current) drug therapy: Secondary | ICD-10-CM | POA: Insufficient documentation

## 2016-06-23 DIAGNOSIS — M8580 Other specified disorders of bone density and structure, unspecified site: Secondary | ICD-10-CM | POA: Insufficient documentation

## 2016-06-23 DIAGNOSIS — C50411 Malignant neoplasm of upper-outer quadrant of right female breast: Secondary | ICD-10-CM

## 2016-06-23 NOTE — Patient Instructions (Signed)
[facility] at [facility] Discharge Instructions  RECOMMENDATIONS MADE BY THE CONSULTANT AND ANY TEST RESULTS WILL BE SENT TO YOUR REFERRING PHYSICIAN.  You were seen today by Dr. Louise Zhou Follow up in 3months See Amy up front for appointments   Thank you for choosing [facility] at [facility] to provide your oncology and hematology care.  To afford each patient quality time with our provider, please arrive at least 15 minutes before your scheduled appointment time.    If you have a lab appointment with the Cancer Center please come in thru the  Main Entrance and check in at the main information desk  You need to re-schedule your appointment should you arrive 10 or more minutes late.  We strive to give you quality time with our providers, and arriving late affects you and other patients whose appointments are after yours.  Also, if you no show three or more times for appointments you may be dismissed from the clinic at the providers discretion.     Again, thank you for choosing [facility].  Our hope is that these requests will decrease the amount of time that you wait before being seen by our physicians.       _____________________________________________________________  Should you have questions after your visit to  Cancer Center, please contact our office at [phone] between the hours of 8:30 a.m. and 4:30 p.m.  Voicemails left after 4:30 p.m. will not be returned until the following business day.  For prescription refill requests, have your pharmacy contact our office.       Resources For Cancer Patients and their Caregivers ? American Cancer Society: Can assist with transportation, wigs, general needs, runs Look Good Feel Better.        1-888-227-6333 ? Cancer Care: Provides financial assistance, online support groups, medication/co-pay assistance.  1-800-813-HOPE (4673) ? [facility] Assists [place] cancer patients and their families through emotional , educational and financial support.  336-427-4357 ? [place] DSS Where to apply for food stamps, Medicaid and utility assistance. 336-342-1394 ? [facility]: Transportation to medical appointments. 336-347-2287 ? Social Security Administration: May apply for disability if have a Stage IV cancer. 336-342-7796 1-800-772-1213 ? [place] Aging, Disability and Transit Services: Assists with nutrition, care and transit needs. 336-349-2343  Cancer Center Support Programs: @[email or web address]@ > Cancer Support Group  2nd Tuesday of the month 1pm-2pm, Journey Room  > Creative Journey  3rd Tuesday of the month 1130am-1pm, Journey Room  > Look Good Feel Better  1st Wednesday of the month 10am-12 noon, Journey Room (Call American Cancer Society to register 1-800-395-5775)    

## 2016-06-23 NOTE — Progress Notes (Signed)
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 Dalbert Batman on 05/01/2015 Adjuvant XRT  HISTORY OF PRESENTING ILLNESS:  [name] 83 y.o. female is here for additional follow-up of ER+ carcinoma of the R breast. She has completed XRT. She continues on arimidex without difficulty.   She is doing well today; tolerating Arimidex well other than the hot flashes. From the sweating, she has some redness and irritation on her bra and panty lines due to chronic dermatitis. She has been taking her calcium and vitamin D daily. Two weeks ago she had a fall on ice, and landed on her right side. Nothing was broken, but her right shoulder and right side is still sore. Pt has some mild leg swelling that progresses as the day goes on. Denies loss of appetite, abdominal pain, or any other concerns.   MEDICAL HISTORY:  Past Medical History:  Diagnosis Date  . Breast cancer, right breast ([place]) 05/01/2015  . Cancer (HCC)    BREAST CANCER   . Colon polyps   . COPD (chronic obstructive pulmonary disease) ([place])    DR. [place] Bells  . Diabetes mellitus without complication ([place])    PREDIABETIC  CONTROL W/ DIET  . Hypercholesteremia   . Hypertension   . Lung nodules   . Osteoarthritis   . Osteopenia 09/18/2013  . Osteoporosis   . PONV (postoperative nausea and vomiting)   . Shortness of breath dyspnea    W/ EXERTION    SURGICAL HISTORY: Past Surgical History:  Procedure Laterality Date  . ABDOMINAL HYSTERECTOMY    .  APPENDECTOMY    . BARTHOLIN GLAND CYST EXCISION    . BREAST LUMPECTOMY WITH RADIOACTIVE SEED LOCALIZATION Right 05/23/2015   Procedure: RIGHT BREAST LUMPECTOMY WITH RADIOACTIVE SEED LOCALIZATION;  Surgeon: Fanny Skates, MD;  Location: [facility];  Service: General;  Laterality: Right;  . BREAST SURGERY     2000  LEFT BREAST  LUMP REMOVED  . colonoscopy    . COLONOSCOPY  03/25/2011   Procedure: COLONOSCOPY;  Surgeon: Rogene Houston, MD;  [facility];  Service: Endoscopy;  Laterality: N/A;  9:30 am  . left inguinal hernia repair    . multiple toe surgeries    . TONSILLECTOMY      SOCIAL HISTORY: Social History   Social History  . Marital status: Widowed    Spouse name: N/A  . Number of children: N/A  . Years of education: N/A   Occupational History  . Not on file.   Social History Main Topics  . Smoking status: Former Smoker    Packs/day: 2.00    Years: 50.00    Types: Cigarettes    Quit date: 03/15/1988  . Smokeless tobacco: Never Used  . Alcohol use No  . Drug use: No  . Sexual activity: Not on file   Other Topics Concern  . Not on file   Social History Narrative  . No narrative on file  Widowed 0 children She has nieces and nephews Worked as a IT consultant Ex smoker, quit in 1989.   FAMILY  HISTORY: Family History  Problem Relation Age of Onset  . Colon cancer Father    indicated that the status of her father is unknown.   Father deceased at 48 yo of colon cancer that spread to his bone. He was a smoker. Mother deceased at 81 yo of COPD and chronic heart failure. She was a non-smoker. 1 sister who is 8 years younger. No family history of breast cancer that she is aware of.  ALLERGIES:  is allergic to augmentin [amoxicillin-pot clavulanate]; amoxicillin; codeine; morphine; and neomycin-bacitracin zn-polymyx.  MEDICATIONS:  Current Outpatient Prescriptions  Medication Sig Dispense Refill  . Albuterol Sulfate (PROAIR RESPICLICK) 400 (90  Base) MCG/ACT AEPB Inhale 1 puff into the lungs every 4 (four) hours as needed (Wheezing, chest tightness). 1 each 5  . anastrozole (ARIMIDEX) 1 MG tablet TAKE ONE TABLET BY MOUTH ONCE DAILY. 30 tablet 3  . Bilberry, Vaccinium myrtillus, (BILBERRY PO) Take 1 Dose by mouth daily.     . fluticasone (FLONASE) 50 MCG/ACT nasal spray Place 2 sprays into both nostrils daily. Sometimes uses a second dose qhs    . furosemide (LASIX) 20 MG tablet TAKE (1) TABLET BY MOUTH EACH MORNING. 90 tablet 1  . lisinopril (PRINIVIL,ZESTRIL) 20 MG tablet Take 1 tablet (20 mg total) by mouth daily. 90 tablet 1  . Multiple Vitamins-Minerals (CENTRUM SILVER ADULT 50+ PO) Take by mouth.    . nortriptyline (PAMELOR) 10 MG capsule Take 20 mg by mouth at bedtime. Take 2 at night    . polyethylene glycol (MIRALAX / GLYCOLAX) packet Take 17 g by mouth daily as needed. Constipation    . SPIRIVA RESPIMAT 2.5 MCG/ACT AERS INHALE 2 PUFFS EACH DAY. 4 g 5  . Tiotropium Bromide-Olodaterol (STIOLTO RESPIMAT) 2.5-2.5 MCG/ACT AERS Inhale 2 puffs into the lungs daily. 1 Inhaler 1   No current facility-administered medications for this visit.    Review of Systems  Constitutional:       Hot flashes No loss of appetite  HENT: Negative.   Eyes: Negative.   Respiratory: Negative.   Cardiovascular: Positive for leg swelling.  Gastrointestinal: Negative.  Negative for abdominal pain.  Genitourinary: Negative.   Musculoskeletal: Positive for falls and joint pain (R shoulder from fall).       Right sided pain from fall  Skin: Positive for rash (bra and panty line).  Neurological: Negative.   Endo/Heme/Allergies: Negative.   Psychiatric/Behavioral: Negative.   All other systems reviewed and are negative. 14 point ROS was done and is otherwise as detailed above or in HPI  PHYSICAL EXAMINATION: ECOG PERFORMANCE STATUS: 0 - Asymptomatic  Vitals:   06/23/16 1139  BP: (!) 124/53  Pulse: 85  Resp: 18  Temp: 98 F (36.7 C)   Filed  Weights   06/23/16 1139  Weight: 175 lb 14.4 oz (79.8 kg)   Physical Exam  Constitutional: She is oriented to person, place, and time and well-developed, well-nourished, and in no distress.  HENT:  Head: Normocephalic and atraumatic.  Nose: Nose normal.  Mouth/Throat: Oropharynx is clear and moist. No oropharyngeal exudate.  Dentures on top and bottom.  Eyes: Conjunctivae and EOM are normal. Pupils are equal, round, and reactive to light. Right eye exhibits no discharge. Left eye exhibits no discharge. No scleral icterus.  Neck: Normal range of motion. Neck supple. No tracheal deviation present. No thyromegaly present.  Cardiovascular: Normal rate, regular rhythm and normal heart sounds.  Exam reveals no gallop and no friction rub.   No murmur  heard. Pulmonary/Chest: Effort normal and breath sounds normal. She has no wheezes. She has no rales.  Abdominal: Soft. Bowel sounds are normal. She exhibits no distension and no mass. There is no tenderness. There is no rebound and no guarding.  Musculoskeletal: Normal range of motion. She exhibits no edema.  Lymphadenopathy:    She has no cervical adenopathy.  Neurological: She is alert and oriented to person, place, and time. She has normal reflexes. No cranial nerve deficit. Gait normal. Coordination normal.  Skin: Skin is warm and dry.  Psychiatric: Mood, memory, affect and judgment normal.  Nursing note and vitals reviewed.  LABORATORY DATA:  I have reviewed the data as listed Lab Results  Component Value Date   WBC 5.5 05/20/2015   HGB 12.4 05/20/2015   HCT 38.6 05/20/2015   MCV 95.5 05/20/2015   PLT 248 05/20/2015   CMP     Component Value Date/Time   NA 139 10/15/2015 0816   K 4.7 10/15/2015 0816   CL 99 10/15/2015 0816   CO2 27 10/15/2015 0816   GLUCOSE 97 10/15/2015 0816   GLUCOSE 99 05/20/2015 1116   BUN 29 (H) 10/15/2015 0816   CREATININE 0.95 10/15/2015 0816   CREATININE 0.83 04/03/2014 1556   CALCIUM 10.1 10/15/2015  0816   PROT 6.3 10/15/2015 0816   ALBUMIN 4.1 10/15/2015 0816   AST 30 10/15/2015 0816   ALT 24 10/15/2015 0816   ALKPHOS 58 10/15/2015 0816   BILITOT 0.4 10/15/2015 0816   GFRNONAA 55 (L) 10/15/2015 0816   GFRAA 64 10/15/2015 0816    PATHOLOGY:    RADIOGRAPHIC STUDIES: I have personally reviewed the radiological images as listed and agreed with the findings in the report.  Diagnostic Mammogram 06/08/2016 IMPRESSION: New lumpectomy site right breast. No mammographic evidence of malignancy in either breast.  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 Dalbert Batman on 05/01/2015 R partial mastectomy with radioactive seed localization 05/23/2015 Dr. Dalbert Batman Adjuvant XRT DEXA 07/2015 with osteopenia Chronic dermatitis R inner breast Hot flashes related to AI use  I reviewed the most recent mammogram from 05/2016 with the patient. It was birads 2. Repeat diagnostic mammogram of R breast in 1 year.   Bone density scan already scheduled for May 2018.   Continue Arimidex, calcium, and vitamin D.   She will return for follow up in 3 months. Continue labs with PCP per patient's wishes.    All questions were answered. The patient knows to call the clinic with any problems, questions or concerns.  This document serves as a record of services personally performed by Twana First, MD. It was created on her behalf by Martinique Casey, a trained medical scribe. The creation of this record is based on the scribe's personal observations and the provider's statements to them. This document has been checked and approved by the attending provider.  I have reviewed the above documentation for accuracy and  completeness, and I agree with the above.  This note was electronically signed.    Martinique M Casey  06/23/2016 11:46 AM

## 2016-06-23 NOTE — Progress Notes (Signed)
Patient reports taking arimidex 1 mg daily as prescribed.  She states that her only side effect from the medication are hot flashes and sweating at night, otherwise she is tolerating the medication.

## 2016-06-28 DIAGNOSIS — C50111 Malignant neoplasm of central portion of right female breast: Secondary | ICD-10-CM | POA: Diagnosis not present

## 2016-06-28 DIAGNOSIS — I10 Essential (primary) hypertension: Secondary | ICD-10-CM | POA: Diagnosis not present

## 2016-06-28 DIAGNOSIS — J449 Chronic obstructive pulmonary disease, unspecified: Secondary | ICD-10-CM | POA: Diagnosis not present

## 2016-06-28 DIAGNOSIS — G5793 Unspecified mononeuropathy of bilateral lower limbs: Secondary | ICD-10-CM | POA: Diagnosis not present

## 2016-06-29 ENCOUNTER — Other Ambulatory Visit (INDEPENDENT_AMBULATORY_CARE_PROVIDER_SITE_OTHER): Payer: Medicare Other

## 2016-06-29 ENCOUNTER — Encounter: Payer: Self-pay | Admitting: Pulmonary Disease

## 2016-06-29 ENCOUNTER — Ambulatory Visit (INDEPENDENT_AMBULATORY_CARE_PROVIDER_SITE_OTHER): Payer: Medicare Other | Admitting: Pulmonary Disease

## 2016-06-29 ENCOUNTER — Ambulatory Visit (INDEPENDENT_AMBULATORY_CARE_PROVIDER_SITE_OTHER)
Admission: RE | Admit: 2016-06-29 | Discharge: 2016-06-29 | Disposition: A | Discharge status: Home / Self Care | Payer: Medicare Other | Source: Ambulatory Visit | Attending: Pulmonary Disease | Admitting: Pulmonary Disease

## 2016-06-29 VITALS — BP 132/72 | HR 104 | Ht 62.0 in | Wt 175.0 lb

## 2016-06-29 DIAGNOSIS — R0989 Other specified symptoms and signs involving the circulatory and respiratory systems: Secondary | ICD-10-CM | POA: Insufficient documentation

## 2016-06-29 DIAGNOSIS — R911 Solitary pulmonary nodule: Secondary | ICD-10-CM

## 2016-06-29 DIAGNOSIS — J449 Chronic obstructive pulmonary disease, unspecified: Secondary | ICD-10-CM | POA: Diagnosis not present

## 2016-06-29 DIAGNOSIS — R0600 Dyspnea, unspecified: Secondary | ICD-10-CM

## 2016-06-29 DIAGNOSIS — R9389 Abnormal findings on diagnostic imaging of other specified body structures: Secondary | ICD-10-CM

## 2016-06-29 DIAGNOSIS — M7989 Other specified soft tissue disorders: Secondary | ICD-10-CM | POA: Diagnosis not present

## 2016-06-29 DIAGNOSIS — R938 Abnormal findings on diagnostic imaging of other specified body structures: Secondary | ICD-10-CM | POA: Diagnosis not present

## 2016-06-29 LAB — BASIC METABOLIC PANEL
BUN: 17 mg/dL (ref 6–23)
CALCIUM: 9.9 mg/dL (ref 8.4–10.5)
CO2: 30 meq/L (ref 19–32)
Chloride: 103 mEq/L (ref 96–112)
Creatinine, Ser: 0.99 mg/dL (ref 0.40–1.20)
GFR: 56.57 mL/min — AB (ref >60.00)
Glucose, Bld: 115 mg/dL — ABNORMAL HIGH (ref 70–99)
Potassium: 4.1 mEq/L (ref 3.5–5.1)
SODIUM: 140 meq/L (ref 135–145)

## 2016-06-29 NOTE — Progress Notes (Signed)
Subjective:    Patient ID: [name], female    DOB: 06-20-1928, 83 y.o.   MRN: [id number]  Synopsis: Referred in 2016 for chronic cough. Cough had resolved by the time she saw me. She been followed by [facility] for chronic cough for many years prior felt to be related to sinus congestion. She also had pulmonary nodules and some pleural calcification which was stable on chest imaging through 2012 there. After I saw her we did simple spirometry testing which showed clear airflow obstruction with an FEV1 of 48% predicted. However, the FVC was low at 1.38 L (54% predicted).  She smoked 2 packs a day for many years.  She was diagnosed with breast cancer in March 2017 and treated with lumpectomy, radiation therapy, and Arimidex  HPI Chief Complaint  Patient presents with  . Follow-up    review cx.  pt doing well, notes stable sob with exertion.  CAT score 9.    [name] says that she is at little better.  She has been really anxious lately about her breathing.  She can't do what she used to do because of the dyspnea.  She says that she is not able to do what she wants to due to the dyspnea.  The Stiolto helps a little, but it's not great.  She thinks that the pollen may have caused this.  No sick contacts.  She has some swelling in her ankles, but this is not new.  No chest pain, no orthopnea.  She does keep the head of her bed elevated.    Past Medical History:  Diagnosis Date  . Breast cancer, right breast ([place]) 05/01/2015  . Cancer (HCC)    BREAST CANCER   . Colon polyps   . COPD (chronic obstructive pulmonary disease) ([place])    DR. [place] Bells  . Diabetes mellitus without complication ([place])    PREDIABETIC  CONTROL W/ DIET  . Hypercholesteremia   . Hypertension   . Lung nodules   . Osteoarthritis   . Osteopenia 09/18/2013  . Osteoporosis   . PONV (postoperative nausea and vomiting)   . Shortness of breath dyspnea    W/ EXERTION      Review of Systems  Constitutional:  Negative for chills, fatigue and fever.  HENT: Negative for rhinorrhea, sinus pain and sinus pressure.   Respiratory: Positive for shortness of breath. Negative for choking and wheezing.   Cardiovascular: Negative for chest pain, palpitations and leg swelling.       Objective:   Physical Exam Vitals:   06/29/16 1442  BP: 132/72  Pulse: (!) 104  SpO2: 96%  Weight: 175 lb (79.4 kg)  Height: 5\' 2"  (1.575 m)    RA  Gen: well appearing HENT: OP clear, TM's clear, neck supple PULM: CTA B, normal percussion CV: RRR, no mgr, 1-2+ leg edema GI: BS+, soft, nontender, renal bruit noted Derm: no cyanosis or rash Psyche: anxious     CBC    Component Value Date/Time   WBC 5.5 05/20/2015 1116   RBC 4.04 05/20/2015 1116   HGB 12.4 05/20/2015 1116   HCT 38.6 05/20/2015 1116   HCT 40.4 10/11/2014 0804   PLT 248 05/20/2015 1116   PLT 290 10/11/2014 0804   MCV 95.5 05/20/2015 1116   MCV 92 10/11/2014 0804   MCH 30.7 05/20/2015 1116   MCHC 32.1 05/20/2015 1116   RDW 13.1 05/20/2015 1116   RDW 13.5 10/11/2014 0804   LYMPHSABS 1.8 10/11/2014 0804  EOSABS 0.1 10/11/2014 0804   BASOSABS 0.0 10/11/2014 0804   PFT: 2016 spirometry: Clear airflow obstruction,Ratio 63%  FEV1 0.88 L, 48% predicted  Chest x-ray from the last visit reviewed showing a right upper lobe nodule versus area of scarring, does not seem to have changed significantly on today's chest x-ray       Assessment & Plan:  Dyspnea This problem continues to worsen despite the addition of Stiolto recently. Today on physical exam she has more leg edema than I have seen in the past. Because of this I worry about the possibility of either pulmonary hypertension or congestive heart failure. Chronic kidney disease can also cause this.  Plan: Check proBNP, if elevated then check echocardiogram Check renal function test  Renal bruit Noted on exam today. If proBNP and basic metabolic panel unrevealing consider right renal  ultrasound with vascular/Doppler imaging  Abnormal CXR She has a new right upper lobe nodule versus scarring area in the right lung. Considering her known history of breast cancer we will get a CT scan to ensure there is nothing more worrisome there.  COPD with bronchitis Continue Stiolto.     Current Outpatient Prescriptions:  .  Albuterol Sulfate (PROAIR RESPICLICK) 093 (90 Base) MCG/ACT AEPB, Inhale 1 puff into the lungs every 4 (four) hours as needed (Wheezing, chest tightness)., Disp: 1 each, Rfl: 5 .  anastrozole (ARIMIDEX) 1 MG tablet, TAKE ONE TABLET BY MOUTH ONCE DAILY., Disp: 30 tablet, Rfl: 3 .  Bilberry, Vaccinium myrtillus, (BILBERRY PO), Take 1 Dose by mouth daily. , Disp: , Rfl:  .  fluticasone (FLONASE) 50 MCG/ACT nasal spray, Place 2 sprays into both nostrils daily. Sometimes uses a second dose qhs, Disp: , Rfl:  .  furosemide (LASIX) 20 MG tablet, TAKE (1) TABLET BY MOUTH EACH MORNING., Disp: 90 tablet, Rfl: 1 .  lisinopril (PRINIVIL,ZESTRIL) 20 MG tablet, Take 1 tablet (20 mg total) by mouth daily., Disp: 90 tablet, Rfl: 1 .  Multiple Vitamins-Minerals (CENTRUM SILVER ADULT 50+ PO), Take by mouth., Disp: , Rfl:  .  nortriptyline (PAMELOR) 10 MG capsule, Take 20 mg by mouth at bedtime. Take 2 at night, Disp: , Rfl:  .  polyethylene glycol (MIRALAX / GLYCOLAX) packet, Take 17 g by mouth daily as needed. Constipation, Disp: , Rfl:  .  SPIRIVA RESPIMAT 2.5 MCG/ACT AERS, INHALE 2 PUFFS EACH DAY., Disp: 4 g, Rfl: 5 .  Tiotropium Bromide-Olodaterol (STIOLTO RESPIMAT) 2.5-2.5 MCG/ACT AERS, Inhale 2 puffs into the lungs daily., Disp: 1 Inhaler, Rfl: 1

## 2016-06-29 NOTE — Assessment & Plan Note (Signed)
This problem continues to worsen despite the addition of Stiolto recently. Today on physical exam she has more leg edema than I have seen in the past. Because of this I worry about the possibility of either pulmonary hypertension or congestive heart failure. Chronic kidney disease can also cause this.  Plan: Check proBNP, if elevated then check echocardiogram Check renal function test

## 2016-06-29 NOTE — Assessment & Plan Note (Signed)
Continue Stiolto

## 2016-06-29 NOTE — Assessment & Plan Note (Signed)
She has a new right upper lobe nodule versus scarring area in the right lung. Considering her known history of breast cancer we will get a CT scan to ensure there is nothing more worrisome there.

## 2016-06-29 NOTE — Assessment & Plan Note (Signed)
Noted on exam today. If proBNP and basic metabolic panel unrevealing consider right renal ultrasound with vascular/Doppler imaging

## 2016-06-29 NOTE — Patient Instructions (Signed)
We will call you with the results of the CAT scan We will call you with the results of the blood test, if it is normal then we may need to arrange for an ultrasound of your kidney Keep taking the Stiolto as you are doing  We will bring you back in 3-4 weeks to go over these results and see how you are doing

## 2016-06-30 LAB — PRO B NATRIURETIC PEPTIDE: NT-Pro BNP: 195 pg/mL (ref 0–738)

## 2016-07-01 ENCOUNTER — Telehealth: Payer: Self-pay | Admitting: Pulmonary Disease

## 2016-07-01 DIAGNOSIS — M7989 Other specified soft tissue disorders: Secondary | ICD-10-CM

## 2016-07-01 DIAGNOSIS — M170 Bilateral primary osteoarthritis of knee: Secondary | ICD-10-CM | POA: Diagnosis not present

## 2016-07-01 DIAGNOSIS — M25561 Pain in right knee: Secondary | ICD-10-CM | POA: Diagnosis not present

## 2016-07-01 DIAGNOSIS — M25562 Pain in left knee: Secondary | ICD-10-CM | POA: Diagnosis not present

## 2016-07-01 DIAGNOSIS — R0602 Shortness of breath: Secondary | ICD-10-CM

## 2016-07-01 NOTE — Telephone Encounter (Signed)
Results have been explained to patient, pt expressed understanding.   Notes recorded by Juanito Doom, MD on 06/30/2016 at 8:50 AM EDT A, Please let the patient know this was OK. So I would like for her to have an Echocardiogram to help understand her leg swelling better. Thanks, B    Ref Range & Units 2d ago 82mo ago   Sodium 135 - 145 mEq/L 140  139R    Potassium 3.5 - 5.1 mEq/L 4.1  4.7R    Chloride 96 - 112 mEq/L 103  99R    CO2 19 - 32 mEq/L 30  27R    Glucose, Bld 70 - 99 mg/dL 115   97R    BUN 6 - 23 mg/dL 17  29R     Creatinine, Ser 0.40 - 1.20 mg/dL 0.99  0.95R    Calcium 8.4 - 10.5 mg/dL 9.9  10.1R    GFR >60.00 mL/min 56.57          Order placed for Echo. Will send to [facility] as FYI that this has been done. Nothing further needed.

## 2016-07-08 ENCOUNTER — Ambulatory Visit (HOSPITAL_COMMUNITY)
Admission: RE | Admit: 2016-07-08 | Discharge: 2016-07-08 | Disposition: A | Discharge status: Home / Self Care | Payer: Medicare Other | Source: Ambulatory Visit | Attending: Pulmonary Disease | Admitting: Pulmonary Disease

## 2016-07-08 DIAGNOSIS — M25561 Pain in right knee: Secondary | ICD-10-CM | POA: Diagnosis not present

## 2016-07-08 DIAGNOSIS — I119 Hypertensive heart disease without heart failure: Secondary | ICD-10-CM | POA: Diagnosis not present

## 2016-07-08 DIAGNOSIS — M7989 Other specified soft tissue disorders: Secondary | ICD-10-CM | POA: Diagnosis not present

## 2016-07-08 DIAGNOSIS — I358 Other nonrheumatic aortic valve disorders: Secondary | ICD-10-CM | POA: Insufficient documentation

## 2016-07-08 DIAGNOSIS — I313 Pericardial effusion (noninflammatory): Secondary | ICD-10-CM | POA: Insufficient documentation

## 2016-07-08 DIAGNOSIS — R0602 Shortness of breath: Secondary | ICD-10-CM | POA: Diagnosis not present

## 2016-07-08 DIAGNOSIS — E784 Other hyperlipidemia: Secondary | ICD-10-CM | POA: Insufficient documentation

## 2016-07-08 DIAGNOSIS — M25562 Pain in left knee: Secondary | ICD-10-CM | POA: Diagnosis not present

## 2016-07-08 DIAGNOSIS — M170 Bilateral primary osteoarthritis of knee: Secondary | ICD-10-CM | POA: Diagnosis not present

## 2016-07-08 DIAGNOSIS — I77810 Thoracic aortic ectasia: Secondary | ICD-10-CM | POA: Insufficient documentation

## 2016-07-08 LAB — ECHOCARDIOGRAM COMPLETE
AOPV: 1.03 m/s
AOVTI: 36.1 cm
AV Area VTI index: 1.16 cm2/m2
AV Area VTI: 2.62 cm2
AV VEL mean LVOT/AV: 1.02
AV pk vel: 157 cm/s
AVAREAMEANV: 2.6 cm2
AVAREAMEANVIN: 1.37 cm2/m2
AVG: 5 mmHg
AVPG: 10 mmHg
CHL CUP AV PEAK INDEX: 1.38
CHL CUP AV VEL: 2.2
CHL CUP DOP CALC LVOT VTI: 31.3 cm
DOP CAL AO MEAN VELOCITY: 99.7 cm/s
E/e' ratio: 8.49
EWDT: 246 ms
FS: 52 % — AB (ref 28–44)
IVS/LV PW RATIO, ED: 1.08
LA ID, A-P, ES: 29 mm
LA diam end sys: 29 mm
LA vol: 31.7 mL
LADIAMINDEX: 1.53 cm/m2
LAVOLA4C: 24.7 mL
LAVOLIN: 16.7 mL/m2
LV E/e'average: 8.49
LV PW d: 10.2 mm — AB (ref 0.6–1.1)
LV dias vol index: 19 mL/m2
LV dias vol: 36 mL — AB (ref 46–106)
LV sys vol index: 5 mL/m2
LVEEMED: 8.49
LVELAT: 9.14 cm/s
LVOT area: 2.54 cm2
LVOT peak grad rest: 10 mmHg
LVOT peak vel: 162 cm/s
LVOTD: 18 mm
LVOTSV: 80 mL
LVOTVTI: 0.87 cm
LVSYSVOL: 9 mL — AB
MV Dec: 246
MV Peak grad: 2 mmHg
MV pk A vel: 113 m/s
MV pk E vel: 77.6 m/s
RV LATERAL S' VELOCITY: 16.4 cm/s
RV TAPSE: 29.5 mm
Simpson's disk: 75
Stroke v: 27 ml
TDI e' lateral: 9.14
TDI e' medial: 9.46
Valve area index: 1.16
Valve area: 2.2 cm2

## 2016-07-08 NOTE — Progress Notes (Signed)
*  PRELIMINARY RESULTS* Echocardiogram 2D Echocardiogram has been performed.  [name] 07/08/2016, 1:50 PM

## 2016-07-09 ENCOUNTER — Ambulatory Visit (HOSPITAL_COMMUNITY)
Admission: RE | Admit: 2016-07-09 | Discharge: 2016-07-09 | Disposition: A | Discharge status: Home / Self Care | Payer: Medicare Other | Source: Ambulatory Visit | Attending: Pulmonary Disease | Admitting: Pulmonary Disease

## 2016-07-09 DIAGNOSIS — J439 Emphysema, unspecified: Secondary | ICD-10-CM | POA: Diagnosis not present

## 2016-07-09 DIAGNOSIS — R911 Solitary pulmonary nodule: Secondary | ICD-10-CM | POA: Insufficient documentation

## 2016-07-12 ENCOUNTER — Telehealth: Payer: Self-pay | Admitting: Pulmonary Disease

## 2016-07-12 DIAGNOSIS — R911 Solitary pulmonary nodule: Secondary | ICD-10-CM

## 2016-07-12 NOTE — Telephone Encounter (Signed)
PM  Please Advise-  I spoke with pt and informed her of your message about her CT scan. She wanted to know if you could take a look at her echo as well

## 2016-07-12 NOTE — Telephone Encounter (Signed)
Her echo shows slight thickening of heart which is likely related to age and hypertension. She has mild valve abnormalities and very small fluid around heart that do not appear to be significant. There is no evidence of pulmonary hypertension. Overall the echo does not show any serious issues.   PM

## 2016-07-12 NOTE — Telephone Encounter (Signed)
Spoke with pt and informed her of the results per PM. Pt understood and she is aware to keep her follow up appointment. Nothing further is needed. Pt had no further questions

## 2016-07-12 NOTE — Telephone Encounter (Signed)
PM  Please Advise in BQ's absence-   Received a call report from [facility] for this pt's CT scan, which is in Epic. Due to BQ being unavailable this week will send to DOD. PM please advise if there is anything we need to inform the pt.

## 2016-07-12 NOTE — Telephone Encounter (Signed)
I have reviewed the CT scan. Please let her know that there may be a slight increase in a nodule in right lower lobe. We will follow up with a repeat CT without contrast in 3 months. Please order.  There is emphysema, other nodules in the right that are stable and scarring likely from radiation. Follow up with TP and BQ.

## 2016-07-19 ENCOUNTER — Other Ambulatory Visit: Payer: Self-pay | Admitting: Pulmonary Disease

## 2016-07-19 DIAGNOSIS — R911 Solitary pulmonary nodule: Secondary | ICD-10-CM | POA: Insufficient documentation

## 2016-07-19 NOTE — Progress Notes (Signed)
Spoke with pt and notified of results per Dr.McQuaid. Pt verbalized understanding and denied any questions. 

## 2016-07-19 NOTE — Progress Notes (Signed)
Spoke with pt and notified of results per Dr. Wert. Pt verbalized understanding and denied any questions. 

## 2016-07-21 ENCOUNTER — Ambulatory Visit: Payer: Medicare Other | Admitting: Adult Health

## 2016-07-26 ENCOUNTER — Ambulatory Visit (HOSPITAL_COMMUNITY)
Admission: RE | Admit: 2016-07-26 | Discharge: 2016-07-26 | Disposition: A | Discharge status: Home / Self Care | Payer: Medicare Other | Source: Ambulatory Visit | Attending: Pulmonary Disease | Admitting: Pulmonary Disease

## 2016-07-26 DIAGNOSIS — R918 Other nonspecific abnormal finding of lung field: Secondary | ICD-10-CM | POA: Insufficient documentation

## 2016-07-26 DIAGNOSIS — I700 Atherosclerosis of aorta: Secondary | ICD-10-CM | POA: Diagnosis not present

## 2016-07-26 DIAGNOSIS — J439 Emphysema, unspecified: Secondary | ICD-10-CM | POA: Diagnosis not present

## 2016-07-26 DIAGNOSIS — R911 Solitary pulmonary nodule: Secondary | ICD-10-CM | POA: Diagnosis not present

## 2016-07-26 LAB — GLUCOSE, CAPILLARY: Glucose-Capillary: 102 mg/dL — ABNORMAL HIGH (ref 65–99)

## 2016-07-26 MED ORDER — FLUDEOXYGLUCOSE F - 18 (FDG) INJECTION
8.6700 | Freq: Once | INTRAVENOUS | Status: AC | PRN
  Administered 2016-07-26: 8.67 via INTRAVENOUS

## 2016-07-28 ENCOUNTER — Other Ambulatory Visit: Payer: Self-pay

## 2016-07-28 DIAGNOSIS — R911 Solitary pulmonary nodule: Secondary | ICD-10-CM

## 2016-07-29 ENCOUNTER — Encounter: Payer: Self-pay | Admitting: Acute Care

## 2016-07-29 ENCOUNTER — Ambulatory Visit (INDEPENDENT_AMBULATORY_CARE_PROVIDER_SITE_OTHER): Payer: Medicare Other | Admitting: Acute Care

## 2016-07-29 ENCOUNTER — Telehealth: Payer: Self-pay | Admitting: Acute Care

## 2016-07-29 VITALS — BP 128/64 | HR 89 | Ht 62.0 in | Wt 175.6 lb

## 2016-07-29 DIAGNOSIS — J449 Chronic obstructive pulmonary disease, unspecified: Secondary | ICD-10-CM | POA: Diagnosis not present

## 2016-07-29 DIAGNOSIS — R0600 Dyspnea, unspecified: Secondary | ICD-10-CM

## 2016-07-29 MED ORDER — ALBUTEROL SULFATE 108 (90 BASE) MCG/ACT IN AEPB: 1.0000 | INHALATION_SPRAY | RESPIRATORY_TRACT | 5 refills | Status: DC | PRN

## 2016-07-29 NOTE — Assessment & Plan Note (Signed)
Persistent worsening dyspnea No significant improvement on Stialto Desaturation to 88% on room air with exertion in the office today Plan We will walk you in the office today. We will order oxygen at 2 L [place] . Wear your oxygen to maintain your saturations greater than 94%. We will schedule PFT's today. We will prescribe a rescue inhaler today. Please call insurance and have them determine which inhaler is best covered under your plan. We will phone in prescription when we get your call. Use the rescue inhaler as needed for breakthrough shortness of breath up to 1 puff  every 6 hours. CT chest without contrast 02/01/2017. Follow up in 2 weeks after PFT's with NP. Consider CTA Please contact office for sooner follow up if symptoms do not improve or worsen or seek emergency care

## 2016-07-29 NOTE — Patient Instructions (Addendum)
We will walk you in the office today. We will order oxygen at 2 L [place] . Wear your oxygen to maintain your saturations greater than 94%. We will schedule PFT's today. We will prescribe a rescue inhaler today. Please call insurance and have them determine which inhaler is best covered under your plan. We will phone in prescription when we get your call. Use the rescue inhaler as needed for breakthrough shortness of breath up to 1 puff  every 6 hours. CT chest without contrast 02/01/2017. Follow up in 2 weeks after PFT's with NP. Please contact office for sooner follow up if symptoms do not improve or worsen or seek emergency care

## 2016-07-29 NOTE — Telephone Encounter (Signed)
Please place order for CT chest without contrast for November 2018. This is follow-up for scan done in May to monitor a pulmonary nodule. Thank you so much

## 2016-07-29 NOTE — Progress Notes (Addendum)
History of Present Illness [name] is a 83 y.o. female with breast cancer diagnosed 05/2015. ( Treated with lumpectomy, radiation therapy, and Aimidex). She is followed by Dr. Lake Bells.  Synopsis: Referred in 2016 for chronic cough. Cough had resolved by the time she saw me. She been followed by [facility] for chronic cough for many years prior felt to be related to sinus congestion. She also had pulmonary nodules and some pleural calcification which was stable on chest imaging through 2012 there. After I saw her we did simple spirometry testing which showed clear airflow obstruction with an FEV1 of 48% predicted. However, the FVC was low at 1.38 L (54% predicted).  She smoked 2 packs a day for many years.  She was diagnosed with breast cancer in March 2017 and treated with lumpectomy, radiation therapy, and Arimidex  07/29/2016 Acute Visit: Pt. Presents today for worsening dyspnea.She states she gets breathless with any exertion. She denies cough or sputum production. She is using the Stialto 2 puffs  every morning. She states she does not have a rescue inhaler. She states her leg swelling is better in the morning , but worse in the evening. She is currently taking Lasix per her PCP. She denies fever, chest pain, orthopnea, or hemoptysis. She feels her worsening dyspnea secondary to Arimidex which she is currently taking to prevent spread of breast cancer.  Test Results:  CT Scan 07/09/2016 Suggestion of mild interval increase in size of sub solid nodule within the right lower lobe, given differences in slice thickness. This nodule is indeterminate in etiology however, given the appearance, pulmonary malignancy cannot be excluded. Consider thoracic surgery consultation. At a minimum, recommend follow chest CT in 3-6 months.  Stable solid nodule within the medial right upper lobe dating back to 06/20/2015. Recommend attention on follow-up CT.  PET Scan 07/26/2016 The non solid  nodule within the posterior right lower lobe measures 1.5 cm, image 46 of series 8. The SUV max associated with this nodule is equal to 2.05. Allowing for differences in technique this is not significantly changed in size from 06/20/2015. The solid nodule within the posteromedial right upper lobe is again noted measuring 1 cm. SUV max associated with this nodule is equal to 2.04.  CBC Latest Ref Rng & Units 05/20/2015 10/11/2014  WBC 4.0 - 10.5 K/uL 5.5 5.7  Hemoglobin 12.0 - 15.0 g/dL 12.4 -  Hematocrit 36.0 - 46.0 % 38.6 40.4  Platelets 150 - 400 K/uL 248 290    BMP Latest Ref Rng & Units 06/29/2016 10/15/2015 05/20/2015  Glucose 70 - 99 mg/dL 115(H) 97 99  BUN 6 - 23 mg/dL 17 29(H) 18  Creatinine 0.40 - 1.20 mg/dL 0.99 0.95 1.03(H)  BUN/Creat Ratio 12 - 28 - 31(H) -  Sodium 135 - 145 mEq/L 140 139 142  Potassium 3.5 - 5.1 mEq/L 4.1 4.7 4.9  Chloride 96 - 112 mEq/L 103 99 105  CO2 19 - 32 mEq/L 30 27 25   Calcium 8.4 - 10.5 mg/dL 9.9 10.1 10.0     ProBNP    Component Value Date/Time   PROBNP 195 06/29/2016 1540     Ct Chest Wo Contrast  Result Date: 07/10/2016 CLINICAL DATA:  Patient with possible right upper lobe pulmonary nodule, for further evaluation. EXAM: CT CHEST WITHOUT CONTRAST TECHNIQUE: Multidetector CT imaging of the chest was performed following the standard protocol without IV contrast. COMPARISON:  CT chest 06/20/2015 FINDINGS: Cardiovascular: Heart is enlarged. Coronary arterial vascular calcifications. Ascending  thoracic aorta measures 4.2 cm (image 71; series 2) peripheral calcified atherosclerotic plaque. Mediastinum/Nodes: No enlarged axillary, mediastinal or hilar lymphadenopathy. Unchanged 8 mm prevascular lymph node (image 42; series 2). The esophagus is normal in caliber. Lungs/Pleura: Suspect dependent mucus within the posterior trachea. Calcified granuloma left lower lobe. Dependent atelectasis bilateral lower lobes. Centrilobular and paraseptal emphysematous  change. Within the right lower lobe there is a 1.6 x 1.9 cm solid and cystic nodule (image 88; series 4), which appears increased in size from prior were it measured 1.3 x 1.4 cm. Within the medial right upper lobe there is a 1.2 x 1.0 cm nodule (image 29; series 4), unchanged from prior were it measured 1.3 x 1.1 cm. Interval increase in subpleural consolidation within the right upper lobe (image 41; series 4). No pleural effusion or pneumothorax. Upper Abdomen: Liver is normal in size and contour. Multiple low-attenuation lesions are demonstrated throughout the left and right hepatic lobes. There is a 2.9 x 2.5 cm partially exophytic cystic lesion within the posterior right hepatic lobe (image 121; series 2) with an internal calcification. Aortic atherosclerosis. Musculoskeletal: Thoracic spine degenerative changes. No aggressive or acute appearing osseous lesions. Postsurgical changes right breast. IMPRESSION: Suggestion of mild interval increase in size of sub solid nodule within the right lower lobe, given differences in slice thickness. This nodule is indeterminate in etiology however, given the appearance, pulmonary malignancy cannot be excluded. Consider thoracic surgery consultation. At a minimum, recommend follow chest CT in 3-6 months. Stable solid nodule within the medial right upper lobe dating back to 06/20/2015. Recommend attention on follow-up CT. Increased peripheral consolidation within the subpleural aspect of the right upper lobe favored to be post radiation changes. Emphysematous change. Ascending thoracic aorta measures 4.2 cm. Recommend annual imaging followup by CTA or MRA. This recommendation follows 2010 ACCF/AHA/AATS/ACR/ASA/SCA/SCAI/SIR/STS/SVM Guidelines for the Diagnosis and Management of Patients with Thoracic Aortic Disease. Circulation. 2010; 121: I627-O350 These results will be called to the ordering clinician or representative by the Radiologist Assistant, and communication  documented in the PACS or zVision Dashboard. Electronically Signed   By: Lovey Newcomer M.D.   On: 07/10/2016 11:43   Nm Pet Image Initial (pi) Skull Base To Thigh  Result Date: 07/26/2016 CLINICAL DATA:  Initial treatment strategy for pulmonary nodule. EXAM: NUCLEAR MEDICINE PET SKULL BASE TO THIGH TECHNIQUE: 8.67 mCi F-18 FDG was injected intravenously. Full-ring PET imaging was performed from the skull base to thigh after the radiotracer. CT data was obtained and used for attenuation correction and anatomic localization. FASTING BLOOD GLUCOSE:  Value: 102 mg/dl COMPARISON:  CT chest 07/09/2016 FINDINGS: NECK No hypermetabolic lymph nodes in the neck. CHEST Moderate cardiac enlargement. Aortic atherosclerosis noted. Calcifications within the RCA, LAD and left circumflex coronary artery noted. No hypermetabolic mediastinal or hilar lymph nodes identified. Moderate changes of emphysema noted. The non solid nodule within the posterior right lower lobe measures 1.5 cm, image 46 of series 8. The SUV max associated with this nodule is equal to 2.05. Allowing for differences in technique this is not significantly changed in size from 06/20/2015. The solid nodule within the posteromedial right upper lobe is again noted measuring 1 cm. SUV max associated with this nodule is equal to 2.04. ABDOMEN/PELVIS No abnormal hypermetabolic activity within the liver, pancreas, adrenal glands, or spleen. Aortic atherosclerosis. No hypermetabolic lymph nodes in the abdomen or pelvis. SKELETON No focal hypermetabolic activity to suggest skeletal metastasis. Postsurgical changes are identified involving the lateral aspect of the right breast.  No hypermetabolic chest wall mass or axillary adenopathy identified. Increased radiotracer uptake corresponding to the anterior aspect of the right fourth and fifth rib noted with underlying it fracture deformities identified. IMPRESSION: 1. The SUV max associated with the non solid nodule within  the right lower lobe is equal to 2.05. In general PET-CT is not recommended for the assessment of non solid nodules. Given the absence of significant solid component within this nodule a reasonable management strategy may include Initial follow-up with CT at 6-12 months with repeat CT is recommended every 2 years until 5 years of stability has been established. This recommendation follows the consensus statement: Guidelines for Management of Incidental Pulmonary Nodules Detected on CT Images: From the Fleischner Society 2017; Radiology 2017; 284:228-243. Otherwise, pathologic correlation with the necessary to confirm presence of underlying malignancy. 2. Mild increased uptake is associated with the solid nodule in the medial right upper lobe with an SUV max equal to 2.04. 3. Aortic Atherosclerosis (ICD10-I70.0) and Emphysema (ICD10-J43.9). Electronically Signed   By: Kerby Moors M.D.   On: 07/26/2016 11:51     Past medical hx Past Medical History:  Diagnosis Date  . Breast cancer, right breast ([place]) 05/01/2015  . Cancer ([facility])    BREAST CANCER   . Colon polyps   . COPD (chronic obstructive pulmonary disease) ([place])    DR. Lake Bells  . Diabetes mellitus without complication ([place])    PREDIABETIC  CONTROL W/ DIET  . Hypercholesteremia   . Hypertension   . Lung nodules   . Osteoarthritis   . Osteopenia 09/18/2013  . Osteoporosis   . PONV (postoperative nausea and vomiting)   . Shortness of breath dyspnea    W/ EXERTION     Social History  Substance Use Topics  . Smoking status: Former Smoker    Packs/day: 2.00    Years: 50.00    Types: Cigarettes    Quit date: 03/15/1988  . Smokeless tobacco: Never Used  . Alcohol use No    Tobacco Cessation: Patient is a previous smoker who quit in 1990. She had a 100-pack-year smoking history  Past surgical hx, Family hx, Social hx all reviewed.  Current Outpatient Prescriptions on File Prior to Visit  Medication Sig  . anastrozole (ARIMIDEX) 1 MG  tablet TAKE ONE TABLET BY MOUTH ONCE DAILY.  . Bilberry, Vaccinium myrtillus, (BILBERRY PO) Take 1 Dose by mouth daily.   . fluticasone (FLONASE) 50 MCG/ACT nasal spray Place 2 sprays into both nostrils daily. Sometimes uses a second dose qhs  . furosemide (LASIX) 20 MG tablet TAKE (1) TABLET BY MOUTH EACH MORNING.  Marland Kitchen lisinopril (PRINIVIL,ZESTRIL) 20 MG tablet Take 1 tablet (20 mg total) by mouth daily.  . Multiple Vitamins-Minerals (CENTRUM SILVER ADULT 50+ PO) Take by mouth.  . nortriptyline (PAMELOR) 10 MG capsule Take 20 mg by mouth at bedtime. Take 2 at night  . polyethylene glycol (MIRALAX / GLYCOLAX) packet Take 17 g by mouth daily as needed. Constipation  . SPIRIVA RESPIMAT 2.5 MCG/ACT AERS INHALE 2 PUFFS EACH DAY.  Marland Kitchen Tiotropium Bromide-Olodaterol (STIOLTO RESPIMAT) 2.5-2.5 MCG/ACT AERS Inhale 2 puffs into the lungs daily.   No current facility-administered medications on file prior to visit.      Allergies  Allergen Reactions  . Augmentin [Amoxicillin-Pot Clavulanate] Nausea And Vomiting  . Amoxicillin Diarrhea  . Codeine Nausea And Vomiting  . Morphine Nausea And Vomiting  . Neomycin-Bacitracin Zn-Polymyx Itching and Rash    Review Of Systems:  Constitutional:   No  weight loss, night sweats,  Fevers, chills, fatigue, or  lassitude.  HEENT:   No headaches,  Difficulty swallowing,  Tooth/dental problems, or  Sore throat,                No sneezing, itching, ear ache, nasal congestion, post nasal drip,   CV:  No chest pain,  Orthopnea, PND, swelling in lower extremities, anasarca, dizziness, palpitations, syncope.   GI  No heartburn, indigestion, abdominal pain, nausea, vomiting, diarrhea, change in bowel habits, loss of appetite, bloody stools.   Resp: + shortness of breath with exertion or at rest.  No excess mucus, no productive cough,  + non-productive cough,  No coughing up of blood.  No change in color of mucus.  No wheezing.  No chest wall deformity  Skin: no rash  or lesions.  GU: no dysuria, change in color of urine, no urgency or frequency.  No flank pain, no hematuria   MS:  No joint pain or swelling.  No decreased range of motion.  No back pain.  Psych:  No change in mood or affect. No depression or anxiety.  No memory loss.   Vital Signs BP 128/64 (BP Location: Left Arm, Cuff Size: Normal)   Pulse 89   Ht 5\' 2"  (1.575 m)   Wt 175 lb 9.6 oz (79.7 kg)   SpO2 94%   BMI 32.12 kg/m    Physical Exam:  General- No distress,  A&Ox3, pleasant, anxious ENT: No sinus tenderness, TM clear, pale nasal mucosa, no oral exudate,no post nasal drip, no LAN Cardiac: S1, S2, regular rate and rhythm, no murmur Chest: No wheeze/ rales/ dullness; no accessory muscle use, no nasal flaring, no sternal retractions Abd.: Soft Non-tender, obese Ext: No clubbing cyanosis, trace LE edema, L>R Neuro:  normal strength, but deconditioned at baseline Skin: No rashes, warm and dry Psych: normal mood and behavior, tearful   Assessment/Plan Dyspnea Persistent worsening dyspnea No significant improvement on Stialto Desaturation to 88% on room air with exertion in the office today Plan We will walk you in the office today. We will order oxygen at 2 L [place] . Wear your oxygen to maintain your saturations greater than 94%. We will schedule PFT's today. We will prescribe a rescue inhaler today. Please call insurance and have them determine which inhaler is best covered under your plan. We will phone in prescription when we get your call. Use the rescue inhaler as needed for breakthrough shortness of breath up to 1 puff  every 6 hours. CT chest without contrast 02/01/2017. Follow up in 2 weeks after PFT's with NP. D-dimer Lower extremity dopplers Please contact office for sooner follow up if symptoms do not improve or worsen or seek emergency care    Will evaluate d-dimer and lower extremity doppler studies to be thorough.( Ordered to be done at [facility])    Magdalen Spatz, NP 07/29/2016  6:21 PM

## 2016-07-30 ENCOUNTER — Other Ambulatory Visit: Payer: Self-pay | Admitting: Acute Care

## 2016-07-30 DIAGNOSIS — R600 Localized edema: Secondary | ICD-10-CM

## 2016-08-02 ENCOUNTER — Telehealth (HOSPITAL_COMMUNITY): Payer: Self-pay

## 2016-08-02 ENCOUNTER — Ambulatory Visit (HOSPITAL_COMMUNITY)
Admission: RE | Admit: 2016-08-02 | Discharge: 2016-08-02 | Disposition: A | Discharge status: Home / Self Care | Payer: Medicare Other | Source: Ambulatory Visit | Attending: Oncology | Admitting: Oncology

## 2016-08-02 DIAGNOSIS — M8589 Other specified disorders of bone density and structure, multiple sites: Secondary | ICD-10-CM | POA: Insufficient documentation

## 2016-08-02 DIAGNOSIS — Z780 Asymptomatic menopausal state: Secondary | ICD-10-CM | POA: Diagnosis not present

## 2016-08-02 DIAGNOSIS — M85852 Other specified disorders of bone density and structure, left thigh: Secondary | ICD-10-CM

## 2016-08-02 DIAGNOSIS — M85851 Other specified disorders of bone density and structure, right thigh: Secondary | ICD-10-CM | POA: Diagnosis not present

## 2016-08-02 NOTE — Telephone Encounter (Signed)
-----   Message from Baird Cancer, PA-C sent at 08/02/2016  2:09 PM EDT ----- Regarding: RE: reaction to med She is seeing pulmonology who is working up her SOB.   She is set-up for PFTs and other tests. Arimidex should not participate in her SOB.  Also, Arimidex can cause vaginal dryness.  She should follow-up with Gyn to evaluate this issue. TK ----- Message ----- From: Darlina Guys, LPN Sent: [id number]/44/6286  12:48 PM To: Baird Cancer, PA-C Subject: FW: reaction to med                            Patient states she is having SOB and her PCP cannot figure out why. She states all the tests have come back negative. She also said her PCP told her it was against the law for her to discuss another providers medicine and for her to call here and see if the arimidex could possibly be causing her SOB. She has had increasing SOB for 3 months and now is on 2 L  O2 prn. She has been started on a new inhaler with little help. I know we would usually refer problems like this to the PCP but she has already been. She also states she has noticed an odor coming from her vagina and wonders if arimidex is causing that too.  Thanks, MB  ----- Message ----- From: Louis Meckel Sent: 08/02/2016   8:36 AM To: Darlina Guys, LPN Subject: reaction to med                                Patient called and said she is having a reaction to arimidex. She said she has been to PCP and she was told to call us about this.  Please call patient at [phone].  Thanks

## 2016-08-02 NOTE — Telephone Encounter (Signed)
Notified patient that the provider does not think the Arimidex is causing her SOB. She also needs to see her gynecologist for the vaginal odor. She states her PCP takes care of her GYN tests and problems. Encouraged patient to continue follow ups with pcp with understanding verbalized.

## 2016-08-02 NOTE — Telephone Encounter (Signed)
Called patient back and am unable to reach her. Left message requesting she call cancer center back.

## 2016-08-02 NOTE — Telephone Encounter (Signed)
-----   Message from Louis Meckel sent at 08/02/2016  8:36 AM EDT ----- Regarding: reaction to med Patient called and said she is having a reaction to arimidex. She said she has been to PCP and she was told to call us about this.  Please call patient at [phone].  Thanks

## 2016-08-02 NOTE — Telephone Encounter (Signed)
CT already in.

## 2016-08-03 ENCOUNTER — Ambulatory Visit (HOSPITAL_COMMUNITY)
Admission: RE | Admit: 2016-08-03 | Discharge: 2016-08-03 | Disposition: A | Discharge status: Home / Self Care | Payer: Medicare Other | Source: Ambulatory Visit | Attending: Acute Care | Admitting: Acute Care

## 2016-08-03 DIAGNOSIS — R600 Localized edema: Secondary | ICD-10-CM | POA: Insufficient documentation

## 2016-08-13 ENCOUNTER — Encounter: Payer: Self-pay | Admitting: Adult Health

## 2016-08-13 ENCOUNTER — Ambulatory Visit (INDEPENDENT_AMBULATORY_CARE_PROVIDER_SITE_OTHER): Payer: Medicare Other | Admitting: Pulmonary Disease

## 2016-08-13 ENCOUNTER — Ambulatory Visit (INDEPENDENT_AMBULATORY_CARE_PROVIDER_SITE_OTHER): Payer: Medicare Other | Admitting: Adult Health

## 2016-08-13 DIAGNOSIS — R0600 Dyspnea, unspecified: Secondary | ICD-10-CM

## 2016-08-13 DIAGNOSIS — J449 Chronic obstructive pulmonary disease, unspecified: Secondary | ICD-10-CM | POA: Diagnosis not present

## 2016-08-13 DIAGNOSIS — J9611 Chronic respiratory failure with hypoxia: Secondary | ICD-10-CM

## 2016-08-13 DIAGNOSIS — R911 Solitary pulmonary nodule: Secondary | ICD-10-CM

## 2016-08-13 LAB — PULMONARY FUNCTION TEST
DL/VA % pred: 78 %
DL/VA: 3.62 ml/min/mmHg/L
DLCO cor % pred: 60 %
DLCO cor: 13.45 ml/min/mmHg
DLCO unc % pred: 58 %
DLCO unc: 13.1 ml/min/mmHg
FEF 25-75 POST: 0.8 L/s
FEF 25-75 Pre: 0.63 L/sec
FEF2575-%Change-Post: 27 %
FEF2575-%Pred-Post: 78 %
FEF2575-%Pred-Pre: 61 %
FEV1-%CHANGE-POST: 5 %
FEV1-%Pred-Post: 78 %
FEV1-%Pred-Pre: 74 %
FEV1-POST: 1.25 L
FEV1-Pre: 1.19 L
FEV1FVC-%Change-Post: 0 %
FEV1FVC-%Pred-Pre: 91 %
FEV6-%Change-Post: 4 %
FEV6-%PRED-PRE: 87 %
FEV6-%Pred-Post: 90 %
FEV6-POST: 1.85 L
FEV6-PRE: 1.77 L
FEV6FVC-%Change-Post: 0 %
FEV6FVC-%PRED-POST: 105 %
FEV6FVC-%PRED-PRE: 105 %
FVC-%CHANGE-POST: 4 %
FVC-%PRED-POST: 85 %
FVC-%PRED-PRE: 82 %
FVC-POST: 1.86 L
FVC-PRE: 1.78 L
POST FEV6/FVC RATIO: 99 %
PRE FEV6/FVC RATIO: 99 %
Post FEV1/FVC ratio: 67 %
Pre FEV1/FVC ratio: 67 %
RV % pred: 109 %
RV: 2.65 L
TLC % PRED: 96 %
TLC: 4.66 L

## 2016-08-13 NOTE — Progress Notes (Signed)
PFT completed today 08/13/16.

## 2016-08-13 NOTE — Addendum Note (Signed)
Addended by: Parke Poisson E on: 08/13/2016 04:57 PM   Modules accepted: Orders

## 2016-08-13 NOTE — Progress Notes (Signed)
@Patient  ID: [name], female    DOB: 11-11-1928, 83 y.o.   MRN: [id number]  Chief Complaint  Patient presents with  . Follow-up    COPD     Referring provider: Kathyrn Drown, MD  HPI: 83 yo female former smoker followed for COPD , Lung nodule  Breast cancer 05/2015 s/p lumpectomy, radiation, Arimidex   TEST  2016 spirometry: Clear airflow obstruction,Ratio 63%  FEV1 0.88 L, 48% predicted CT chest 07/10/16 >mild interval increase in size of sub solid nodule within the right lower lobe,  PET scan 07/26/16 ok , follow CT chest in 6 mon .   08/13/2016 Follow up : COPD , Chronic cough and Lung nodule  Pt returns for 2 weeks follow up for COPD.Marland Kitchen  PFT done today shows mild airflow obstruction with an FEV1 at 78%, ratio 67, FVC 85%, no significant bronchodilator response, DLCO 58%. Positive mid flow reversibility. Last visit was noted have some desats with walking . Started on Oxygen 2l/m with activity . Uses at home with house work.  She needs POC if she is going to go outside the house.  She remains on Stiloto. Has  Seen some improvements . Does still get tired and out of breath.  Denies cough or wheezing .   Has hx of chronic cough , on ACE . Denies current cough .    Had LE swelling . Venous doppler neg for DVT 08/03/16 . BNP was neg.   CT chest showed some small nodules with increase in size in sub solid nodule in RLL. PET scan was ok and recommended her to have repeat CT chest in 6 months .  CT chest did show enlarged ascending thoracic aorta at 4.2cm . Advised to follow up with PCP so this can be followed.    Allergies  Allergen Reactions  . Augmentin [Amoxicillin-Pot Clavulanate] Nausea And Vomiting  . Amoxicillin Diarrhea  . Codeine Nausea And Vomiting  . Morphine Nausea And Vomiting  . Neomycin-Bacitracin Zn-Polymyx Itching and Rash    Unable to use Neosporin    Immunization History  Administered Date(s) Administered  . Influenza Split 01/30/2013  .  Influenza,inj,Quad PF,36+ Mos 12/12/2013, 01/02/2015, 12/16/2015  . Influenza-Unspecified 12/14/2011  . Pneumococcal Conjugate-13 09/18/2013  . Pneumococcal Polysaccharide-23 01/07/2010  . Tdap 12/14/2010  . Zoster 03/16/2011    Past Medical History:  Diagnosis Date  . Breast cancer, right breast ([place]) 05/01/2015  . Cancer (HCC)    BREAST CANCER   . Colon polyps   . COPD (chronic obstructive pulmonary disease) ([place])    DR. Lake Bells  . Diabetes mellitus without complication ([place])    PREDIABETIC  CONTROL W/ DIET  . Hypercholesteremia   . Hypertension   . Lung nodules   . Osteoarthritis   . Osteopenia 09/18/2013  . Osteoporosis   . PONV (postoperative nausea and vomiting)   . Shortness of breath dyspnea    W/ EXERTION    Tobacco History: History  Smoking Status  . Former Smoker  . Packs/day: 2.00  . Years: 50.00  . Types: Cigarettes  . Quit date: 03/15/1988  Smokeless Tobacco  . Never Used   Counseling given: Not Answered   Outpatient Encounter Prescriptions as of 08/13/2016  Medication Sig  . Albuterol Sulfate (PROAIR RESPICLICK) 825 (90 Base) MCG/ACT AEPB Inhale 1 puff into the lungs every 4 (four) hours as needed (Wheezing, chest tightness).  Marland Kitchen anastrozole (ARIMIDEX) 1 MG tablet TAKE ONE TABLET BY MOUTH ONCE DAILY.  Floria Raveling,  Vaccinium myrtillus, (BILBERRY PO) Take 1 Dose by mouth daily.   . fluticasone (FLONASE) 50 MCG/ACT nasal spray Place 2 sprays into both nostrils daily. Sometimes uses a second dose qhs  . furosemide (LASIX) 20 MG tablet TAKE (1) TABLET BY MOUTH EACH MORNING.  Marland Kitchen lisinopril (PRINIVIL,ZESTRIL) 20 MG tablet Take 1 tablet (20 mg total) by mouth daily.  . Multiple Vitamins-Minerals (CENTRUM SILVER ADULT 50+ PO) Take by mouth.  . nortriptyline (PAMELOR) 10 MG capsule Take 2-3 at night  . polyethylene glycol (MIRALAX / GLYCOLAX) packet Take 17 g by mouth daily as needed. Constipation  . Tiotropium Bromide-Olodaterol (STIOLTO RESPIMAT) 2.5-2.5 MCG/ACT  AERS Inhale 2 puffs into the lungs daily.  . [DISCONTINUED] SPIRIVA RESPIMAT 2.5 MCG/ACT AERS INHALE 2 PUFFS EACH DAY. (Patient not taking: Reported on 08/13/2016)   No facility-administered encounter medications on file as of 08/13/2016.      Review of Systems  Constitutional:   No  weight loss, night sweats,  Fevers, chills, + fatigue, or  lassitude.  HEENT:   No headaches,  Difficulty swallowing,  Tooth/dental problems, or  Sore throat,                No sneezing, itching, ear ache, nasal congestion, post nasal drip,   CV:  No chest pain,  Orthopnea, PND, swelling in lower extremities, anasarca, dizziness, palpitations, syncope.   GI  No heartburn, indigestion, abdominal pain, nausea, vomiting, diarrhea, change in bowel habits, loss of appetite, bloody stools.   Resp:   No chest wall deformity  Skin: no rash or lesions.  GU: no dysuria, change in color of urine, no urgency or frequency.  No flank pain, no hematuria   MS:  No joint pain or swelling.  No decreased range of motion.  No back pain.    Physical Exam  BP 128/60 (BP Location: Left Arm, Cuff Size: Normal)   Pulse 79   Ht 5' 2.5" (1.588 m)   Wt 174 lb (78.9 kg)   SpO2 98%   BMI 31.32 kg/m   GEN: A/Ox3; pleasant , NAD, well nourished    HEENT:  [place]/AT,  EACs-clear, TMs-wnl, NOSE-clear, THROAT-clear, no lesions, no postnasal drip or exudate noted.   NECK:  Supple w/ fair ROM; no JVD; normal carotid impulses w/o bruits; no thyromegaly or nodules palpated; no lymphadenopathy.    RESP  Clear  P & A; w/o, wheezes/ rales/ or rhonchi. no accessory muscle use, no dullness to percussion  CARD:  RRR, no m/r/g, tr peripheral edema, pulses intact, no cyanosis or clubbing.  GI:   Soft & nt; nml bowel sounds; no organomegaly or masses detected.   Musco: Warm bil, no deformities or joint swelling noted.   Neuro: alert, no focal deficits noted.    Skin: Warm, no lesions or rashes    Lab Results:  CBC  No results  found for: BNP  ProBNP    Component Value Date/Time   PROBNP 195 06/29/2016 1540    Imaging: Nm Pet Image Initial (pi) Skull Base To Thigh  Result Date: 07/26/2016 CLINICAL DATA:  Initial treatment strategy for pulmonary nodule. EXAM: NUCLEAR MEDICINE PET SKULL BASE TO THIGH TECHNIQUE: 8.67 mCi F-18 FDG was injected intravenously. Full-ring PET imaging was performed from the skull base to thigh after the radiotracer. CT data was obtained and used for attenuation correction and anatomic localization. FASTING BLOOD GLUCOSE:  Value: 102 mg/dl COMPARISON:  CT chest 07/09/2016 FINDINGS: NECK No hypermetabolic lymph nodes in the neck. CHEST Moderate  cardiac enlargement. Aortic atherosclerosis noted. Calcifications within the RCA, LAD and left circumflex coronary artery noted. No hypermetabolic mediastinal or hilar lymph nodes identified. Moderate changes of emphysema noted. The non solid nodule within the posterior right lower lobe measures 1.5 cm, image 46 of series 8. The SUV max associated with this nodule is equal to 2.05. Allowing for differences in technique this is not significantly changed in size from 06/20/2015. The solid nodule within the posteromedial right upper lobe is again noted measuring 1 cm. SUV max associated with this nodule is equal to 2.04. ABDOMEN/PELVIS No abnormal hypermetabolic activity within the liver, pancreas, adrenal glands, or spleen. Aortic atherosclerosis. No hypermetabolic lymph nodes in the abdomen or pelvis. SKELETON No focal hypermetabolic activity to suggest skeletal metastasis. Postsurgical changes are identified involving the lateral aspect of the right breast. No hypermetabolic chest wall mass or axillary adenopathy identified. Increased radiotracer uptake corresponding to the anterior aspect of the right fourth and fifth rib noted with underlying it fracture deformities identified. IMPRESSION: 1. The SUV max associated with the non solid nodule within the right lower  lobe is equal to 2.05. In general PET-CT is not recommended for the assessment of non solid nodules. Given the absence of significant solid component within this nodule a reasonable management strategy may include Initial follow-up with CT at 6-12 months with repeat CT is recommended every 2 years until 5 years of stability has been established. This recommendation follows the consensus statement: Guidelines for Management of Incidental Pulmonary Nodules Detected on CT Images: From the Fleischner Society 2017; Radiology 2017; 284:228-243. Otherwise, pathologic correlation with the necessary to confirm presence of underlying malignancy. 2. Mild increased uptake is associated with the solid nodule in the medial right upper lobe with an SUV max equal to 2.04. 3. Aortic Atherosclerosis (ICD10-I70.0) and Emphysema (ICD10-J43.9). Electronically Signed   By: Kerby Moors M.D.   On: 07/26/2016 11:51   US Venous Img Lower Bilateral  Result Date: 08/03/2016 CLINICAL DATA:  83 year old female with bilateral lower extremity edema. Initial encounter. EXAM: BILATERAL LOWER EXTREMITY VENOUS DOPPLER ULTRASOUND TECHNIQUE: Gray-scale sonography with graded compression, as well as color Doppler and duplex ultrasound were performed to evaluate the lower extremity deep venous systems from the level of the common femoral vein and including the common femoral, femoral, profunda femoral, popliteal and calf veins including the posterior tibial, peroneal and gastrocnemius veins when visible. The superficial great saphenous vein was also interrogated. Spectral Doppler was utilized to evaluate flow at rest and with distal augmentation maneuvers in the common femoral, femoral and popliteal veins. COMPARISON:  None. FINDINGS: RIGHT LOWER EXTREMITY Common Femoral Vein: No evidence of thrombus. Normal compressibility, respiratory phasicity and response to augmentation. Saphenofemoral Junction: No evidence of thrombus. Normal compressibility  and flow on color Doppler imaging. Profunda Femoral Vein: No evidence of thrombus. Normal compressibility and flow on color Doppler imaging. Femoral Vein: No evidence of thrombus. Normal compressibility, respiratory phasicity and response to augmentation. Popliteal Vein: No evidence of thrombus. Normal compressibility, respiratory phasicity and response to augmentation. Calf Veins: No evidence of thrombus. Normal compressibility and flow on color Doppler imaging. Superficial Great Saphenous Vein: No evidence of thrombus. Normal compressibility and flow on color Doppler imaging. LEFT LOWER EXTREMITY Common Femoral Vein: No evidence of thrombus. Normal compressibility, respiratory phasicity and response to augmentation. Saphenofemoral Junction: No evidence of thrombus. Normal compressibility and flow on color Doppler imaging. Profunda Femoral Vein: No evidence of thrombus. Normal compressibility and flow on color Doppler imaging. Femoral Vein:  No evidence of thrombus. Normal compressibility, respiratory phasicity and response to augmentation. Popliteal Vein: No evidence of thrombus. Normal compressibility, respiratory phasicity and response to augmentation. Calf Veins: No evidence of thrombus. Normal compressibility and flow on color Doppler imaging. Superficial Great Saphenous Vein: No evidence of thrombus. Normal compressibility and flow on color Doppler imaging. IMPRESSION: No evidence of DVT within either lower extremity. Electronically Signed   By: Genia Del M.D.   On: 08/03/2016 09:37   Dg Bone Density  Result Date: 08/02/2016 EXAM: DUAL X-RAY ABSORPTIOMETRY (DXA) FOR BONE MINERAL DENSITY IMPRESSION: Ordering Physician:  Dr. Baird Cancer, Your patient Danilyn Cocke completed a BMD test on 08/02/2016 using the [place] (software version: 14.10) manufactured by [employer]. The following summarizes the results of our evaluation. PATIENT BIOGRAPHICAL: Name: ENOLA, SIEBERS Patient ID: [id number] Birth Date: [birth year] Height: 62.0 in. Gender: Female Exam Date: 08/02/2016 Weight: 175.0 lbs. Indications: Advanced Age, Bilateral Oophrectomy, Caucasian, Follow up Osteoporosis, Height Loss, Hx Breast Ca, Low Calcium Intake, Post Menopausal Fractures: Treatments: Arimidex, Multivitamin, Vitamin D DENSITOMETRY RESULTS: Site         Region     Measured Date Measured Age WHO Classification Young Adult T-score BMD         %Change vs. Previous Significant Change (*) DualFemur Neck Right 08/02/2016 85.7 Osteopenia -1.3 0.863 g/cm2 -3.3% - DualFemur Neck Right 08/01/2015 84.7 Normal -1.0 0.892 g/cm2 -6.0% Yes DualFemur Neck Right 08/31/2013 82.8 Normal -0.6 0.949 g/cm2 -3.4% - DualFemur Neck Right 08/31/2011 80.8 Normal -0.4 0.982 g/cm2 - - Left Forearm Radius 33% 08/02/2016 85.7 Osteopenia -1.2 0.627 g/cm2 -2.6% - Left Forearm Radius 33% 08/01/2015 84.7 Normal -1.0 0.644 g/cm2 -0.9% - Left Forearm Radius 33% 08/31/2013 82.8 Normal -0.9 0.649 g/cm2 - - ASSESSMENT: BMD as determined from Femur Neck Right is 0.863 g/cm2 with a T-Score of -1.3. This patient is considered osteopenic according to [facility]) criteria. Compared with the prior study on 08/01/2015, the BMD of the rt. total hip/lt. forearm show no statistically significant change. (Lumbar spine was not utilized due to advanced degenerative changes.) World Pharmacologist (WHO) criteria for post-menopausal, Caucasian Women: Normal:       T-score at or above -1 SD Osteopenia:   T-score between -1 and -2.5 SD Osteoporosis: T-score at or below -2.5 SD RECOMMENDATIONS: [place] recommends that FDA-approved medial therapies be considered in postmenopausal women and men age 62 or older with a: 1. Hip or vertebral (clinical or morphometric) fracture. 2. T-Score of < -2.5 at the spine or hip. 3. Ten-year fracture probability by FRAX of 3% or greater for hip fracture or 20% or greater for major  osteoporotic fracture. All treatment decisions require clinical judgment and consideration of individual patient factors, including patient preferences, co-morbidities, previous drug use, risk factors not captured in the FRAX model (e.g. falls, vitamin D deficiency, increased bone turnover, interval significant decline in bone density) and possible under-or over-estimation of fracture risk by FRAX. All patients should ensure an adequate intake of dietary calcium (1200 mg/d) and vitamin D (800 IU daily) unless contraindicated. FOLLOW-UP: People with diagnosed cases of osteoporosis or osteopenia should be regularly tested for bone mineral density. For patients eligible for Medicare, routine testing is allowed once every 2 years. Testing frequency can be increased for patients who have rapidly progressing disease, or for those who are receiving medical therapy to restore bone mass. I have reviewed this report, and agree with the above findings.  [facility] Dear Dr. Baird Cancer, Your patient Jettie Pagan Zeis completed a FRAX assessment on 08/02/2016 using the [place] (analysis version: 14.10) manufactured by [employer]. The following summarizes the results of our evaluation. PATIENT BIOGRAPHICAL: Name: ALETHEIA, TANGREDI Patient ID: [id number] Birth Date: 1928-08-11 Height:    62.0 in. Gender:     [age]    [age]       Weight:    175.0 lbs. Ethnicity:  White                            Exam Date: 08/02/2016 FRAX* RESULTS:  (version: 3.5) 10-year Probability of Fracture1 Major Osteoporotic Fracture2 Hip Fracture 11.7% 2.9% Population: [place] (Caucasian) Risk Factors: None Based on Femur (Right) Neck BMD 1 -The 10-year probability of fracture may be lower than reported if the patient has received treatment. 2 -Major Osteoporotic Fracture: Clinical Spine, Forearm, Hip or Shoulder *FRAX is a Materials engineer of the [employer] for Metabolic Bone  Disease, a [place] (WHO) [facility]. ASSESSMENT: The probability of a major osteoporotic fracture is 11.7% within the next ten years. The probability of a hip fracture is 2.9% within the next ten years. Electronically Signed   By: Rolm Baptise M.D.   On: 08/02/2016 10:57     Assessment & Plan:   COPD (chronic obstructive pulmonary disease) (HCC) Improved control on Stiolto .  PFT shows signficant improvement from 2016   May have asthma component as well .  No active cough /wheezing . Will hold off on ICS for now .  Use caution with ACE inhibitor as prone to chronic cough   Plan  Patient Instructions  Continue on Stiolto 2 puffs daily .  Continue on Oxygen 2lm/ with activity  Order for POC sent .  Follow up CT chest as planned in 3-4 months .  Follow up Dr. Lake Bells in 3-4 months  Follow up with Primary MD as discussed in August for enlarged Thoracic aorta .  Please contact office for sooner follow up if symptoms do not improve or worsen or seek emergency care      Pulmonary nodule Follow up CT chest in Nov as planned .   Chronic respiratory failure with hypoxia (HCC) Cont on O2 with act  POC ordred.      Rexene Edison, NP 08/13/2016

## 2016-08-13 NOTE — Assessment & Plan Note (Signed)
Follow up CT chest in Nov as planned .

## 2016-08-13 NOTE — Patient Instructions (Addendum)
Continue on Stiolto 2 puffs daily .  Continue on Oxygen 2lm/ with activity  Order for POC sent .  Follow up CT chest as planned in 3-4 months .  Follow up Dr. Lake Bells in 3-4 months  Follow up with Primary MD as discussed in August for enlarged Thoracic aorta .  Please contact office for sooner follow up if symptoms do not improve or worsen or seek emergency care

## 2016-08-13 NOTE — Assessment & Plan Note (Signed)
Cont on O2 with act  POC ordred.

## 2016-08-13 NOTE — Assessment & Plan Note (Signed)
Improved control on Stiolto .  PFT shows signficant improvement from 2016   May have asthma component as well .  No active cough /wheezing . Will hold off on ICS for now .  Use caution with ACE inhibitor as prone to chronic cough   Plan  Patient Instructions  Continue on Stiolto 2 puffs daily .  Continue on Oxygen 2lm/ with activity  Order for POC sent .  Follow up CT chest as planned in 3-4 months .  Follow up Dr. Lake Bells in 3-4 months  Follow up with Primary MD as discussed in August for enlarged Thoracic aorta .  Please contact office for sooner follow up if symptoms do not improve or worsen or seek emergency care

## 2016-08-15 NOTE — Progress Notes (Signed)
Reviewed, agree 

## 2016-08-17 ENCOUNTER — Telehealth: Payer: Self-pay | Admitting: Adult Health

## 2016-08-17 DIAGNOSIS — J9611 Chronic respiratory failure with hypoxia: Secondary | ICD-10-CM

## 2016-08-17 NOTE — Telephone Encounter (Signed)
Pt states that she was told they do not carry portable O2 that she can carry on her shoulder, they only carry the ones on wheels. Pt is requesting her O2 order be sent to a DME that will offer the type of POC that she wants. Pt states that she just cannot handle a big tank on wheels.  Please advise [name] if okay to change DME companies and if need be set up with [employer] if local DMEs fail. Thanks.

## 2016-08-17 NOTE — Telephone Encounter (Signed)
lmtcb for pt.  

## 2016-08-17 NOTE — Telephone Encounter (Signed)
That is fine , please send for [employer]

## 2016-08-18 ENCOUNTER — Other Ambulatory Visit: Payer: Self-pay | Admitting: Family Medicine

## 2016-08-18 NOTE — Telephone Encounter (Signed)
Order sent and pt aware 

## 2016-08-27 ENCOUNTER — Other Ambulatory Visit: Payer: Self-pay | Admitting: Pulmonary Disease

## 2016-08-30 ENCOUNTER — Other Ambulatory Visit (HOSPITAL_COMMUNITY): Payer: Self-pay | Admitting: Oncology

## 2016-08-30 DIAGNOSIS — M8580 Other specified disorders of bone density and structure, unspecified site: Secondary | ICD-10-CM

## 2016-08-30 DIAGNOSIS — Z79899 Other long term (current) drug therapy: Secondary | ICD-10-CM

## 2016-08-30 DIAGNOSIS — C50411 Malignant neoplasm of upper-outer quadrant of right female breast: Secondary | ICD-10-CM

## 2016-09-01 ENCOUNTER — Telehealth: Payer: Self-pay | Admitting: Adult Health

## 2016-09-01 NOTE — Telephone Encounter (Signed)
Patient seen by TP on 6.1.18: Patient Instructions  Continue on Stiolto 2 puffs daily .  Continue on Oxygen 2lm/ with activity  Order for POC sent .  Follow up CT chest as planned in 3-4 months .  Follow up Dr. Lake Bells in 3-4 months  Follow up with Primary MD as discussed in August for enlarged Thoracic aorta .  Please contact office for sooner follow up if symptoms do not improve or worsen or seek emergency care    Order to [facility] to evaluate and titrate for best POC was placed at office visit Received fax from [facility] stating that they are unable to lease POC to patient Received fax from [facility] a day later stating that pt had decided to purchase her own [employer] POC and form requesting liter flow, frequency, etc.    TP requested patient be called to ensure that she understands POC does NOT provide continuous O2 and patient should NOT wear POC with sleep.  LMOM TCB x1.

## 2016-09-03 NOTE — Telephone Encounter (Signed)
I have spoken with pt and made her aware of TP's below message. Pt states she has not agreed to buy her own [employer] POC. pt states that she felt that the gentlemen she had spoken with was a high pressured salesmen. Pt states she thinks she can get by with the tanks that she currently has. Pt advised to contact our office for further questions or concerns.  Nothing further needed  Will route to TP as an FYI.

## 2016-09-22 ENCOUNTER — Encounter (HOSPITAL_COMMUNITY): Payer: Self-pay

## 2016-09-22 ENCOUNTER — Encounter (HOSPITAL_COMMUNITY): Payer: Medicare Other | Attending: Oncology | Admitting: Oncology

## 2016-09-22 VITALS — BP 131/57 | HR 89 | Resp 20 | Ht 65.5 in | Wt 172.0 lb

## 2016-09-22 DIAGNOSIS — C50411 Malignant neoplasm of upper-outer quadrant of right female breast: Secondary | ICD-10-CM | POA: Insufficient documentation

## 2016-09-22 DIAGNOSIS — M8580 Other specified disorders of bone density and structure, unspecified site: Secondary | ICD-10-CM | POA: Insufficient documentation

## 2016-09-22 DIAGNOSIS — C50911 Malignant neoplasm of unspecified site of right female breast: Secondary | ICD-10-CM

## 2016-09-22 DIAGNOSIS — Z79899 Other long term (current) drug therapy: Secondary | ICD-10-CM | POA: Insufficient documentation

## 2016-09-22 MED ORDER — CALCIUM CARBONATE-VITAMIN D 500-200 MG-UNIT PO TABS: 2.0000 | ORAL_TABLET | Freq: Every day | ORAL | 5 refills | Status: DC

## 2016-09-22 NOTE — Progress Notes (Signed)
[facility] Note  Patient Care Team: Kathyrn Drown, MD as PCP - General (Family Medicine) Fanny Skates, MD as Consulting Physician (General Surgery)  CHIEF COMPLAINTS/PURPOSE OF CONSULTATION:  R breast invasive ductal carcinoma, Grade I, ER+ PR(-) HER-2(-), Ki-67 10% Bilateral Screening Mammogram on 04/03/2015 with distortion in R breast Ultrasound Guided R breast Core Needle Biopsy (9 o clock position) 04/22/2015, invasive ductal carcinoma, carcinoma in situ is present Consultation with Dr. Dalbert Batman on 05/01/2015 Adjuvant XRT  HISTORY OF PRESENTING ILLNESS:  [name] 83 y.o. female is here for additional follow-up of ER+ carcinoma of the R breast. She has completed XRT. She continues on arimidex without difficulty.   She is doing well today; tolerating Arimidex well other than then minor hot flashes. She denies any CP, SOB, abd pain, recent infections, focal weakness.  MEDICAL HISTORY:  Past Medical History:  Diagnosis Date  . Breast cancer, right breast ([place]) 05/01/2015  . Cancer (HCC)    BREAST CANCER   . Colon polyps   . COPD (chronic obstructive pulmonary disease) ([place])    DR. Lake Bells  . Diabetes mellitus without complication ([place])    PREDIABETIC  CONTROL W/ DIET  . Hypercholesteremia   . Hypertension   . Lung nodules   . Osteoarthritis   . Osteopenia 09/18/2013  . Osteoporosis   . PONV (postoperative nausea and vomiting)   . Shortness of breath dyspnea    W/ EXERTION    SURGICAL HISTORY: Past Surgical History:  Procedure Laterality Date  . ABDOMINAL HYSTERECTOMY    . APPENDECTOMY    . BARTHOLIN GLAND CYST EXCISION    . BREAST LUMPECTOMY WITH RADIOACTIVE SEED LOCALIZATION Right 05/23/2015   Procedure: RIGHT BREAST LUMPECTOMY WITH RADIOACTIVE SEED LOCALIZATION;  Surgeon: Fanny Skates, MD;  Location: [facility];  Service: General;  Laterality: Right;  . BREAST SURGERY     2000  LEFT BREAST  LUMP REMOVED  . colonoscopy      . COLONOSCOPY  03/25/2011   Procedure: COLONOSCOPY;  Surgeon: Rogene Houston, MD;  [facility];  Service: Endoscopy;  Laterality: N/A;  9:30 am  . left inguinal hernia repair    . multiple toe surgeries    . TONSILLECTOMY      SOCIAL HISTORY: Social History   Social History  . Marital status: Widowed    Spouse name: N/A  . Number of children: N/A  . Years of education: N/A   Occupational History  . Not on file.   Social History Main Topics  . Smoking status: Former Smoker    Packs/day: 2.00    Years: 50.00    Types: Cigarettes    Quit date: 03/15/1988  . Smokeless tobacco: Never Used  . Alcohol use No  . Drug use: No  . Sexual activity: Not on file   Other Topics Concern  . Not on file   Social History Narrative  . No narrative on file  Widowed 0 children She has nieces and nephews Worked as a IT consultant Ex smoker, quit in 1989.   FAMILY HISTORY: Family History  Problem Relation Age of Onset  . Colon cancer Father    indicated that the status of her father is unknown.   Father deceased at 61 yo of colon cancer that spread to his bone. He was a smoker. Mother deceased at 64 yo of COPD and chronic heart failure. She was a non-smoker. 1 sister who is 30 years younger. No  family history of breast cancer that she is aware of.  ALLERGIES:  is allergic to augmentin [amoxicillin-pot clavulanate]; amoxicillin; codeine; morphine; and neomycin-bacitracin zn-polymyx.  MEDICATIONS:  Current Outpatient Prescriptions  Medication Sig Dispense Refill  . Albuterol Sulfate (PROAIR RESPICLICK) 010 (90 Base) MCG/ACT AEPB Inhale 1 puff into the lungs every 4 (four) hours as needed (Wheezing, chest tightness). 1 each 5  . anastrozole (ARIMIDEX) 1 MG tablet TAKE ONE TABLET BY MOUTH ONCE DAILY. 30 tablet 5  . Bilberry, Vaccinium myrtillus, (BILBERRY PO) Take 1 Dose by mouth daily.     . cholecalciferol (VITAMIN D) 1000 units tablet Take 2,000 Units by mouth  daily.    . fluticasone (FLONASE) 50 MCG/ACT nasal spray Place 2 sprays into both nostrils daily. Sometimes uses a second dose qhs    . furosemide (LASIX) 20 MG tablet TAKE ONE TABLET BY MOUTH EVERY MORNING. 90 tablet 0  . lisinopril (PRINIVIL,ZESTRIL) 20 MG tablet TAKE ONE TABLET BY MOUTH ONCE DAILY. 90 tablet 0  . Multiple Vitamins-Minerals (CENTRUM SILVER ADULT 50+ PO) Take by mouth.    . nortriptyline (PAMELOR) 10 MG capsule Take 2-3 at night    . polyethylene glycol (MIRALAX / GLYCOLAX) packet Take 17 g by mouth daily as needed. Constipation    . STIOLTO RESPIMAT 2.5-2.5 MCG/ACT AERS INHALE 2 PUFFS INTO THE LUNGS ONCE DAILY. 4 g 2   No current facility-administered medications for this visit.    Review of Systems  Constitutional:       Hot flashes No loss of appetite  HENT: Negative.   Eyes: Negative.   Respiratory: Negative.   Cardiovascular: Positive for leg swelling.  Gastrointestinal: Negative.  Negative for abdominal pain.  Genitourinary: Negative.   Musculoskeletal: Positive for falls and joint pain (R shoulder from fall).       Right sided pain from fall  Skin: Positive for rash (bra and panty line).  Neurological: Negative.   Endo/Heme/Allergies: Negative.   Psychiatric/Behavioral: Negative.   All other systems reviewed and are negative. 14 point ROS was done and is otherwise as detailed above or in HPI  PHYSICAL EXAMINATION: ECOG PERFORMANCE STATUS: 0 - Asymptomatic  Vitals:   09/22/16 1350  BP: (!) 131/57  Pulse: 89  Resp: 20   Filed Weights   09/22/16 1350  Weight: 172 lb (78 kg)   Physical Exam  Constitutional: She is oriented to person, place, and time and well-developed, well-nourished, and in no distress.  HENT:  Head: Normocephalic and atraumatic.  Nose: Nose normal.  Mouth/Throat: Oropharynx is clear and moist. No oropharyngeal exudate.  Dentures on top and bottom.  Eyes: Conjunctivae and EOM are normal. Pupils are equal, round, and reactive to  light. Right eye exhibits no discharge. Left eye exhibits no discharge. No scleral icterus.  Neck: Normal range of motion. Neck supple. No tracheal deviation present. No thyromegaly present.  Cardiovascular: Normal rate, regular rhythm and normal heart sounds.  Exam reveals no gallop and no friction rub.   No murmur heard. Pulmonary/Chest: Effort normal and breath sounds normal. She has no wheezes. She has no rales. Right breast exhibits no inverted nipple, no mass, no nipple discharge, no skin change and no tenderness. Left breast exhibits no inverted nipple, no mass, no nipple discharge, no skin change and no tenderness.  Abdominal: Soft. Bowel sounds are normal. She exhibits no distension and no mass. There is no tenderness. There is no rebound and no guarding.  Musculoskeletal: Normal range of motion. She exhibits no edema.  Lymphadenopathy:    She has no cervical adenopathy.  Neurological: She is alert and oriented to person, place, and time. She has normal reflexes. No cranial nerve deficit. Gait normal. Coordination normal.  Skin: Skin is warm and dry.  Psychiatric: Mood, memory, affect and judgment normal.  Nursing note and vitals reviewed.  LABORATORY DATA:  I have reviewed the data as listed Lab Results  Component Value Date   WBC 5.5 05/20/2015   HGB 12.4 05/20/2015   HCT 38.6 05/20/2015   MCV 95.5 05/20/2015   PLT 248 05/20/2015   CMP     Component Value Date/Time   NA 140 06/29/2016 1540   NA 139 10/15/2015 0816   K 4.1 06/29/2016 1540   CL 103 06/29/2016 1540   CO2 30 06/29/2016 1540   GLUCOSE 115 (H) 06/29/2016 1540   BUN 17 06/29/2016 1540   BUN 29 (H) 10/15/2015 0816   CREATININE 0.99 06/29/2016 1540   CREATININE 0.83 04/03/2014 1556   CALCIUM 9.9 06/29/2016 1540   PROT 6.3 10/15/2015 0816   ALBUMIN 4.1 10/15/2015 0816   AST 30 10/15/2015 0816   ALT 24 10/15/2015 0816   ALKPHOS 58 10/15/2015 0816   BILITOT 0.4 10/15/2015 0816   GFRNONAA 55 (L) 10/15/2015  0816   GFRAA 64 10/15/2015 0816    PATHOLOGY:    RADIOGRAPHIC STUDIES: I have personally reviewed the radiological images as listed and agreed with the findings in the report.  Diagnostic Mammogram 06/08/2016 IMPRESSION: New lumpectomy site right breast. No mammographic evidence of malignancy in either breast.  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 Dalbert Batman on 05/01/2015 R partial mastectomy with radioactive seed localization 05/23/2015 Dr. Dalbert Batman Adjuvant XRT DEXA 07/2015 with osteopenia Chronic dermatitis R inner breast Hot flashes related to AI use  Clinically NED on exam today.   I reviewed the most recent mammogram from 05/2016 with the patient. It was birads 2. Repeat diagnostic mammogram of bilateral breasts in 1 year.   Bone density scan from May 2018 revealed osteopenia which is stable.   Continue Arimidex, calcium, and vitamin D.   She will return for follow up in 6 months. Continue labs with PCP per patient's wishes.    All questions were answered. The patient knows to call the clinic with any problems, questions or concerns.    This note was electronically signed.    Twana First, MD  09/22/2016 1:56 PM

## 2016-09-28 DIAGNOSIS — Z853 Personal history of malignant neoplasm of breast: Secondary | ICD-10-CM | POA: Diagnosis not present

## 2016-09-28 DIAGNOSIS — Z08 Encounter for follow-up examination after completed treatment for malignant neoplasm: Secondary | ICD-10-CM | POA: Diagnosis not present

## 2016-09-28 DIAGNOSIS — C50411 Malignant neoplasm of upper-outer quadrant of right female breast: Secondary | ICD-10-CM | POA: Diagnosis not present

## 2016-09-28 DIAGNOSIS — L905 Scar conditions and fibrosis of skin: Secondary | ICD-10-CM | POA: Diagnosis not present

## 2016-09-28 DIAGNOSIS — C50419 Malignant neoplasm of upper-outer quadrant of unspecified female breast: Secondary | ICD-10-CM | POA: Insufficient documentation

## 2016-09-28 DIAGNOSIS — Z170 Estrogen receptor positive status [ER+]: Secondary | ICD-10-CM | POA: Diagnosis not present

## 2016-10-14 DIAGNOSIS — M25561 Pain in right knee: Secondary | ICD-10-CM | POA: Diagnosis not present

## 2016-10-14 DIAGNOSIS — M25562 Pain in left knee: Secondary | ICD-10-CM | POA: Diagnosis not present

## 2016-10-14 DIAGNOSIS — M170 Bilateral primary osteoarthritis of knee: Secondary | ICD-10-CM | POA: Diagnosis not present

## 2016-10-15 ENCOUNTER — Telehealth: Payer: Self-pay | Admitting: Family Medicine

## 2016-10-15 DIAGNOSIS — I10 Essential (primary) hypertension: Secondary | ICD-10-CM

## 2016-10-15 DIAGNOSIS — R739 Hyperglycemia, unspecified: Secondary | ICD-10-CM

## 2016-10-15 DIAGNOSIS — E785 Hyperlipidemia, unspecified: Secondary | ICD-10-CM

## 2016-10-15 NOTE — Telephone Encounter (Signed)
Blood work ordered in EPIC. Patient notified. 

## 2016-10-15 NOTE — Telephone Encounter (Signed)
Pt is requesting lab orders to be sent over for an upcoming appt with Dr. Nicki Reaper.

## 2016-10-15 NOTE — Telephone Encounter (Signed)
Lipid, liver, metabolic 7, [id number], prediabetes

## 2016-10-15 NOTE — Telephone Encounter (Signed)
Last blood work 03/2016- Lipid, Liver, Met 7 and HgbA1c

## 2016-10-18 DIAGNOSIS — I10 Essential (primary) hypertension: Secondary | ICD-10-CM | POA: Diagnosis not present

## 2016-10-18 DIAGNOSIS — E785 Hyperlipidemia, unspecified: Secondary | ICD-10-CM | POA: Diagnosis not present

## 2016-10-18 DIAGNOSIS — R739 Hyperglycemia, unspecified: Secondary | ICD-10-CM | POA: Diagnosis not present

## 2016-10-19 LAB — BASIC METABOLIC PANEL
BUN/Creatinine Ratio: 27 (ref 12–28)
BUN: 26 mg/dL (ref 8–27)
CALCIUM: 10.4 mg/dL — AB (ref 8.7–10.3)
CHLORIDE: 103 mmol/L (ref 96–106)
CO2: 26 mmol/L (ref 20–29)
Creatinine, Ser: 0.95 mg/dL (ref 0.57–1.00)
GFR calc Af Amer: 63 mL/min/{1.73_m2} (ref >59)
GFR, EST NON AFRICAN AMERICAN: 55 mL/min/{1.73_m2} — AB (ref >59)
Glucose: 95 mg/dL (ref 65–99)
POTASSIUM: 5.1 mmol/L (ref 3.5–5.2)
Sodium: 141 mmol/L (ref 134–144)

## 2016-10-19 LAB — LIPID PANEL
CHOL/HDL RATIO: 2.6 ratio (ref 0.0–4.4)
CHOLESTEROL TOTAL: 219 mg/dL — AB (ref 100–199)
HDL: 85 mg/dL (ref >39)
LDL CALC: 116 mg/dL — AB (ref 0–99)
Triglycerides: 91 mg/dL (ref 0–149)
VLDL CHOLESTEROL CAL: 18 mg/dL (ref 5–40)

## 2016-10-19 LAB — HEPATIC FUNCTION PANEL
ALBUMIN: 4.6 g/dL (ref 3.5–4.7)
ALT: 28 IU/L (ref 0–32)
AST: 27 IU/L (ref 0–40)
Alkaline Phosphatase: 73 IU/L (ref 39–117)
Bilirubin Total: 0.4 mg/dL (ref 0.0–1.2)
Bilirubin, Direct: 0.1 mg/dL (ref 0.00–0.40)
Total Protein: 6.9 g/dL (ref 6.0–8.5)

## 2016-10-19 LAB — HEMOGLOBIN A1C
ESTIMATED AVERAGE GLUCOSE: 128 mg/dL
Hgb A1c MFr Bld: 6.1 % — ABNORMAL HIGH (ref 4.8–5.6)

## 2016-10-20 DIAGNOSIS — M1711 Unilateral primary osteoarthritis, right knee: Secondary | ICD-10-CM | POA: Diagnosis not present

## 2016-10-20 DIAGNOSIS — M25561 Pain in right knee: Secondary | ICD-10-CM | POA: Diagnosis not present

## 2016-10-25 ENCOUNTER — Ambulatory Visit (INDEPENDENT_AMBULATORY_CARE_PROVIDER_SITE_OTHER): Payer: Medicare Other | Admitting: Family Medicine

## 2016-10-25 ENCOUNTER — Encounter: Payer: Self-pay | Admitting: Family Medicine

## 2016-10-25 DIAGNOSIS — R7303 Prediabetes: Secondary | ICD-10-CM | POA: Diagnosis not present

## 2016-10-25 DIAGNOSIS — I7789 Other specified disorders of arteries and arterioles: Secondary | ICD-10-CM | POA: Diagnosis not present

## 2016-10-25 DIAGNOSIS — I700 Atherosclerosis of aorta: Secondary | ICD-10-CM | POA: Diagnosis not present

## 2016-10-25 DIAGNOSIS — C50911 Malignant neoplasm of unspecified site of right female breast: Secondary | ICD-10-CM

## 2016-10-25 DIAGNOSIS — E785 Hyperlipidemia, unspecified: Secondary | ICD-10-CM

## 2016-10-25 NOTE — Progress Notes (Signed)
   Subjective:    Patient ID: [name], female    DOB: 1929-02-23, 83 y.o.   MRN: [id number]  Hypertension  This is a chronic problem. Pertinent negatives include no chest pain, headaches or shortness of breath. Treatments tried: lisinipril, lasix.  Tries to eat healthy and exercise. This patient having some complex issues. She has breast cancer undergoing some radiation. Followed by the breast cancer center. Patient also has enlarged thoracic aorta Patient is taking calcium and vitamin D supplement along with Centrum Silver that has 200 mg calcium and an thousand vitamin D Patient has not had any falls She does take her fluid pill blood pressure medicine regular basis She does try to eat healthy She denies being depressed Review of Systems  Constitutional: Negative for activity change, fatigue and fever.  Respiratory: Negative for cough and shortness of breath.   Cardiovascular: Negative for chest pain and leg swelling.  Neurological: Negative for headaches.       Objective:   Physical Exam  Constitutional: She appears well-nourished. No distress.  Cardiovascular: Normal rate, regular rhythm and normal heart sounds.   No murmur heard. Pulmonary/Chest: Effort normal and breath sounds normal. No respiratory distress.  Musculoskeletal: She exhibits no edema.  Lymphadenopathy:    She has no cervical adenopathy.  Neurological: She is alert. She exhibits normal muscle tone.  Psychiatric: Her behavior is normal.  Vitals reviewed.   Recent lab work reviewed with the patient in detail     25 minutes was spent with the patient. Greater than half the time was spent in discussion and answering questions and counseling regarding the issues that the patient came in for today.  Assessment & Plan:  Hypercalcemia I recommend that she did not take any calcium supplements she may continue her multivitamin. She'll repeat her met 7 again in one month's time to monitor the calcium ear I  do not feel that the patient is having metastatic disease with her cancer at this point but this will be kept in mind  Cancer the breast she is followed on a regular basis by oncology.  Prediabetes A1c reasonable watch diet stay active  Hyperlipidemia improvement previous labs reviewed  Aortic atherosclerosis on CAT scan cholesterol blood pressure under control  Enlarged thoracic aorta monitor this on a yearly basis.

## 2016-10-27 DIAGNOSIS — M25562 Pain in left knee: Secondary | ICD-10-CM | POA: Diagnosis not present

## 2016-10-27 DIAGNOSIS — M1712 Unilateral primary osteoarthritis, left knee: Secondary | ICD-10-CM | POA: Diagnosis not present

## 2016-11-03 DIAGNOSIS — M1711 Unilateral primary osteoarthritis, right knee: Secondary | ICD-10-CM | POA: Diagnosis not present

## 2016-11-03 DIAGNOSIS — M25561 Pain in right knee: Secondary | ICD-10-CM | POA: Diagnosis not present

## 2016-11-10 DIAGNOSIS — M25562 Pain in left knee: Secondary | ICD-10-CM | POA: Diagnosis not present

## 2016-11-10 DIAGNOSIS — M1712 Unilateral primary osteoarthritis, left knee: Secondary | ICD-10-CM | POA: Diagnosis not present

## 2016-11-12 DIAGNOSIS — D3132 Benign neoplasm of left choroid: Secondary | ICD-10-CM | POA: Diagnosis not present

## 2016-11-12 DIAGNOSIS — H2513 Age-related nuclear cataract, bilateral: Secondary | ICD-10-CM | POA: Diagnosis not present

## 2016-11-12 DIAGNOSIS — H353132 Nonexudative age-related macular degeneration, bilateral, intermediate dry stage: Secondary | ICD-10-CM | POA: Diagnosis not present

## 2016-11-12 DIAGNOSIS — H40013 Open angle with borderline findings, low risk, bilateral: Secondary | ICD-10-CM | POA: Diagnosis not present

## 2016-11-18 DIAGNOSIS — M25561 Pain in right knee: Secondary | ICD-10-CM | POA: Diagnosis not present

## 2016-11-18 DIAGNOSIS — M1711 Unilateral primary osteoarthritis, right knee: Secondary | ICD-10-CM | POA: Diagnosis not present

## 2016-11-22 DIAGNOSIS — C50911 Malignant neoplasm of unspecified site of right female breast: Secondary | ICD-10-CM | POA: Diagnosis not present

## 2016-11-23 LAB — CBC WITH DIFFERENTIAL/PLATELET
Basophils Absolute: 0 10*3/uL (ref 0.0–0.2)
Basos: 0 %
EOS (ABSOLUTE): 0.2 10*3/uL (ref 0.0–0.4)
EOS: 3 %
HEMATOCRIT: 39.2 % (ref 34.0–46.6)
Hemoglobin: 13 g/dL (ref 11.1–15.9)
Immature Grans (Abs): 0 10*3/uL (ref 0.0–0.1)
Immature Granulocytes: 0 %
LYMPHS ABS: 1.7 10*3/uL (ref 0.7–3.1)
Lymphs: 29 %
MCH: 30 pg (ref 26.6–33.0)
MCHC: 33.2 g/dL (ref 31.5–35.7)
MCV: 90 fL (ref 79–97)
MONOS ABS: 0.5 10*3/uL (ref 0.1–0.9)
Monocytes: 9 %
Neutrophils Absolute: 3.3 10*3/uL (ref 1.4–7.0)
Neutrophils: 59 %
Platelets: 277 10*3/uL (ref 150–379)
RBC: 4.34 x10E6/uL (ref 3.77–5.28)
RDW: 14 % (ref 12.3–15.4)
WBC: 5.8 10*3/uL (ref 3.4–10.8)

## 2016-11-23 LAB — CALCIUM: Calcium: 10.9 mg/dL — ABNORMAL HIGH (ref 8.7–10.3)

## 2016-11-24 DIAGNOSIS — M1712 Unilateral primary osteoarthritis, left knee: Secondary | ICD-10-CM | POA: Diagnosis not present

## 2016-11-24 DIAGNOSIS — M25562 Pain in left knee: Secondary | ICD-10-CM | POA: Diagnosis not present

## 2016-11-24 NOTE — Addendum Note (Signed)
Addended by: Karle Barr on: 11/24/2016 03:08 PM   Modules accepted: Orders

## 2016-11-26 LAB — PTH, INTACT AND CALCIUM
CALCIUM: 10.3 mg/dL (ref 8.7–10.3)
PTH: 58 pg/mL (ref 15–65)

## 2016-11-26 LAB — VITAMIN D 25 HYDROXY (VIT D DEFICIENCY, FRACTURES): Vit D, 25-Hydroxy: 43 ng/mL (ref 30.0–100.0)

## 2016-11-30 ENCOUNTER — Other Ambulatory Visit: Payer: Self-pay | Admitting: Pulmonary Disease

## 2016-12-01 DIAGNOSIS — M25562 Pain in left knee: Secondary | ICD-10-CM | POA: Diagnosis not present

## 2016-12-01 DIAGNOSIS — M25561 Pain in right knee: Secondary | ICD-10-CM | POA: Diagnosis not present

## 2016-12-01 DIAGNOSIS — M170 Bilateral primary osteoarthritis of knee: Secondary | ICD-10-CM | POA: Diagnosis not present

## 2016-12-03 ENCOUNTER — Ambulatory Visit (INDEPENDENT_AMBULATORY_CARE_PROVIDER_SITE_OTHER): Payer: Medicare Other | Admitting: Pulmonary Disease

## 2016-12-03 ENCOUNTER — Encounter: Payer: Self-pay | Admitting: Pulmonary Disease

## 2016-12-03 VITALS — BP 136/74 | HR 86 | Ht 62.0 in | Wt 168.0 lb

## 2016-12-03 DIAGNOSIS — J9611 Chronic respiratory failure with hypoxia: Secondary | ICD-10-CM

## 2016-12-03 DIAGNOSIS — J449 Chronic obstructive pulmonary disease, unspecified: Secondary | ICD-10-CM | POA: Diagnosis not present

## 2016-12-03 DIAGNOSIS — R911 Solitary pulmonary nodule: Secondary | ICD-10-CM

## 2016-12-03 NOTE — Patient Instructions (Signed)
For COPD: Continue Stiolto Get a flu shot  For the pulmonary nodule: We have arranged for another CT scan, it will be in November  For chronic respiratory failure with hypoxemia: It's okay to continue walking without oxygen for now, however if you find her having increasing shortness of breath please let us know and we can arrange for portable oxygen concentrator  We will see you back in about 4 months

## 2016-12-03 NOTE — Progress Notes (Signed)
Subjective:    Patient ID: [name], female    DOB: 29-Oct-1928, 83 y.o.   MRN: [id number]  Synopsis: Referred in 2016 for chronic cough. Cough had resolved by the time she saw me. She been followed by [facility] for chronic cough for many years prior felt to be related to sinus congestion. She also had pulmonary nodules and some pleural calcification which was stable on chest imaging through 2012 there. After I saw her we did simple spirometry testing which showed clear airflow obstruction with an FEV1 of 48% predicted. However, the FVC was low at 1.38 L (54% predicted).  She smoked 2 packs a day for many years.  She was diagnosed with breast cancer in March 2017 and treated with lumpectomy, radiation therapy, and Arimidex  HPI Chief Complaint  Patient presents with  . Follow-up    pt states she is at baseline, no complaints today.     [name] says that she is doing well.  She feels that the weather bothers her breathing a lot.  She works in the yard early in the morning.  She will go in around 10 AM or 10:30.    She says that she can't breathe when she walks very far.  She says that she can't use the oxygen when she exerts herself.  She says that her rescue inhaler helps her quite a bit when she walks around for a long period of time.     Past Medical History:  Diagnosis Date  . Breast cancer, right breast ([name]) 05/01/2015  . Cancer (HCC)    BREAST CANCER   . Colon polyps   . COPD (chronic obstructive pulmonary disease) ([place])    DR. Lake [name]  . Diabetes mellitus without complication ([place])    PREDIABETIC  CONTROL W/ DIET  . Hypercholesteremia   . Hypertension   . Lung nodules   . Osteoarthritis   . Osteopenia 09/18/2013  . Osteoporosis   . PONV (postoperative nausea and vomiting)   . Shortness of breath dyspnea    W/ EXERTION      Review of Systems  Constitutional: Negative for chills, fatigue and fever.  HENT: Negative for rhinorrhea, sinus pain and sinus  pressure.   Respiratory: Positive for shortness of breath. Negative for choking and wheezing.   Cardiovascular: Negative for chest pain, palpitations and leg swelling.       Objective:   Physical Exam Vitals:   12/03/16 0852  BP: 136/74  Pulse: 86  SpO2: 95%  Weight: 168 lb (76.2 kg)  Height: 5\' 2"  (1.575 m)    RA  Gen: well appearing HENT: OP clear, TM's clear, neck supple PULM: CTA B, normal percussion CV: RRR, no mgr, trace edema GI: BS+, soft, nontender Derm: no cyanosis or rash Psyche: normal mood and affect    CBC    Component Value Date/Time   WBC 5.8 11/22/2016 1509   WBC 5.5 05/20/2015 1116   RBC 4.34 11/22/2016 1509   RBC 4.04 05/20/2015 1116   HGB 13.0 11/22/2016 1509   HCT 39.2 11/22/2016 1509   PLT 277 11/22/2016 1509   MCV 90 11/22/2016 1509   MCH 30.0 11/22/2016 1509   MCH 30.7 05/20/2015 1116   MCHC 33.2 11/22/2016 1509   MCHC 32.1 05/20/2015 1116   RDW 14.0 11/22/2016 1509   LYMPHSABS 1.7 11/22/2016 1509   EOSABS 0.2 11/22/2016 1509   BASOSABS 0.0 11/22/2016 1509   PFT: 2016 spirometry: Clear airflow obstruction,Ratio 63%  FEV1 0.88  L, 48% predicted  Chest x-ray from the last visit reviewed showing a right upper lobe nodule versus area of scarring, does not seem to have changed significantly on today's chest x-ray  PET 07/2016 nodule not hypermetabolic     Assessment & Plan:   Chronic respiratory failure with hypoxia (HCC)  Chronic obstructive pulmonary disease, unspecified COPD type ([place])  Pulmonary nodule  Discussion: This has been a stable interval for [name]. She does describe some increasing shortness of breath on exertion but says that she does not want to use oxygen right now. Today we discussed about this at length. Technically, using oxygen has no data for people like her to improve mortality or keep her out of the hospital. However, it might make her feel better and walk more which is a good thing. I explained this  to her today but she prefers to not use supplemental oxygen for now.  Plan: For COPD: Continue Stiolto Get a flu shot  For the pulmonary nodule: We have arranged for another CT scan, it will be in November  For chronic respiratory failure with hypoxemia: It's okay to continue walking without oxygen for now, however if you find her having increasing shortness of breath please let us know and we can arrange for portable oxygen concentrator  We will see you back in about 4 months    Current Outpatient Prescriptions:  .  Albuterol Sulfate (PROAIR RESPICLICK) 482 (90 Base) MCG/ACT AEPB, Inhale 1 puff into the lungs every 4 (four) hours as needed (Wheezing, chest tightness)., Disp: 1 each, Rfl: 5 .  anastrozole (ARIMIDEX) 1 MG tablet, TAKE ONE TABLET BY MOUTH ONCE DAILY., Disp: 30 tablet, Rfl: 5 .  Bilberry, Vaccinium myrtillus, (BILBERRY PO), Take 1 Dose by mouth daily. , Disp: , Rfl:  .  fluticasone (FLONASE) 50 MCG/ACT nasal spray, Place 2 sprays into both nostrils daily. Sometimes uses a second dose qhs, Disp: , Rfl:  .  furosemide (LASIX) 20 MG tablet, TAKE ONE TABLET BY MOUTH EVERY MORNING., Disp: 90 tablet, Rfl: 0 .  lisinopril (PRINIVIL,ZESTRIL) 20 MG tablet, TAKE ONE TABLET BY MOUTH ONCE DAILY., Disp: 90 tablet, Rfl: 0 .  Multiple Vitamins-Minerals (CENTRUM SILVER ADULT 50+ PO), Take by mouth., Disp: , Rfl:  .  nortriptyline (PAMELOR) 10 MG capsule, Take 2-3 at night, Disp: , Rfl:  .  polyethylene glycol (MIRALAX / GLYCOLAX) packet, Take 17 g by mouth daily as needed. Constipation, Disp: , Rfl:  .  STIOLTO RESPIMAT 2.5-2.5 MCG/ACT AERS, INHALE 2 PUFFS INTO THE LUNGS ONCE DAILY., Disp: 4 g, Rfl: 5

## 2016-12-28 ENCOUNTER — Ambulatory Visit (INDEPENDENT_AMBULATORY_CARE_PROVIDER_SITE_OTHER): Payer: Medicare Other

## 2016-12-28 DIAGNOSIS — Z23 Encounter for immunization: Secondary | ICD-10-CM

## 2017-01-14 ENCOUNTER — Ambulatory Visit (HOSPITAL_COMMUNITY)
Admission: RE | Admit: 2017-01-14 | Discharge: 2017-01-14 | Disposition: A | Discharge status: Home / Self Care | Payer: Medicare Other | Source: Ambulatory Visit | Attending: Pulmonary Disease | Admitting: Pulmonary Disease

## 2017-01-14 DIAGNOSIS — J439 Emphysema, unspecified: Secondary | ICD-10-CM | POA: Diagnosis not present

## 2017-01-14 DIAGNOSIS — R911 Solitary pulmonary nodule: Secondary | ICD-10-CM

## 2017-01-14 DIAGNOSIS — I700 Atherosclerosis of aorta: Secondary | ICD-10-CM | POA: Diagnosis not present

## 2017-01-17 ENCOUNTER — Telehealth: Payer: Self-pay | Admitting: Pulmonary Disease

## 2017-01-17 ENCOUNTER — Other Ambulatory Visit: Payer: Self-pay

## 2017-01-17 DIAGNOSIS — R918 Other nonspecific abnormal finding of lung field: Secondary | ICD-10-CM

## 2017-01-17 MED ORDER — PREDNISONE 10 MG PO TABS: ORAL_TABLET | ORAL | 0 refills | Status: DC

## 2017-01-17 NOTE — Telephone Encounter (Signed)
Prednisone 20mg  daily x 5 days Call if no improvement or if worse

## 2017-01-17 NOTE — Telephone Encounter (Signed)
Spoke with patient. She is aware of BQ's recs. She verbalized understanding. Medication has been called in.

## 2017-01-17 NOTE — Telephone Encounter (Signed)
While speaking to pt, she incidentally was having acute symptoms and requested further recs.  C/o increased SOB, chest congestion, fatigue Xapprox 5 days.  Producing clear mucus.  Denies fever, chest pain, sinus congestion.  Has not taken anything to help with s/s.  Requesting further recs.    Uses [facility].  BQ please advise.  Thanks!~

## 2017-01-24 ENCOUNTER — Telehealth: Payer: Self-pay | Admitting: Pulmonary Disease

## 2017-01-24 MED ORDER — PREDNISONE 10 MG PO TABS: ORAL_TABLET | ORAL | 0 refills | Status: DC

## 2017-01-24 NOTE — Telephone Encounter (Signed)
Prednisone 10 mg take  4 each am x 2 days,   2 each am x 2 days,  1 each am x 2 days and stop   Please note she is on ACEi which may confuse the interpretation of her symptoms which overlap with copd /acei use

## 2017-01-24 NOTE — Telephone Encounter (Signed)
Called and spoke to pt. Informed her of the recs per MW. Rx sent to preferred pharmacy. Pt verbalized understanding and denied any further questions or concerns at this time.   

## 2017-01-24 NOTE — Telephone Encounter (Signed)
Called and spoke to pt. Pt states after taking the pred 20mg  x 5 days on 01/17/17 (prescribed by BQ) she feels some better but not back to baseline. Pt denies f/c/s, SOB, and CP/tightness. Pt states she still has a productive cough with thick white mucus, pt states the cough is some improved as well. Advised pt that because she has been improving we could keep an eye on her symptoms and if she were to not continue to improve or worsen to then call us back. Pt did not want to do this. Pt is requesting another round of prednisone. Will send message to DOD as BQ is unavailable.   Dr. Melvyn Novas please advise. Thanks.

## 2017-02-16 ENCOUNTER — Other Ambulatory Visit: Payer: Self-pay | Admitting: Family Medicine

## 2017-03-16 DIAGNOSIS — Z9229 Personal history of other drug therapy: Secondary | ICD-10-CM | POA: Diagnosis not present

## 2017-03-16 DIAGNOSIS — M1711 Unilateral primary osteoarthritis, right knee: Secondary | ICD-10-CM | POA: Diagnosis not present

## 2017-03-16 DIAGNOSIS — M25561 Pain in right knee: Secondary | ICD-10-CM | POA: Diagnosis not present

## 2017-03-16 DIAGNOSIS — M170 Bilateral primary osteoarthritis of knee: Secondary | ICD-10-CM | POA: Diagnosis not present

## 2017-03-16 DIAGNOSIS — M21062 Valgus deformity, not elsewhere classified, left knee: Secondary | ICD-10-CM | POA: Diagnosis not present

## 2017-03-16 DIAGNOSIS — M21061 Valgus deformity, not elsewhere classified, right knee: Secondary | ICD-10-CM | POA: Diagnosis not present

## 2017-03-23 DIAGNOSIS — M25561 Pain in right knee: Secondary | ICD-10-CM | POA: Diagnosis not present

## 2017-03-23 DIAGNOSIS — M1711 Unilateral primary osteoarthritis, right knee: Secondary | ICD-10-CM | POA: Diagnosis not present

## 2017-03-24 ENCOUNTER — Other Ambulatory Visit (HOSPITAL_COMMUNITY): Payer: Self-pay | Admitting: *Deleted

## 2017-03-24 DIAGNOSIS — C50911 Malignant neoplasm of unspecified site of right female breast: Secondary | ICD-10-CM

## 2017-03-25 ENCOUNTER — Inpatient Hospital Stay (HOSPITAL_COMMUNITY): Payer: Medicare Other | Attending: Adult Health | Admitting: Adult Health

## 2017-03-25 ENCOUNTER — Encounter (HOSPITAL_COMMUNITY): Payer: Self-pay | Admitting: Adult Health

## 2017-03-25 ENCOUNTER — Other Ambulatory Visit: Payer: Self-pay

## 2017-03-25 ENCOUNTER — Inpatient Hospital Stay (HOSPITAL_COMMUNITY): Payer: Medicare Other

## 2017-03-25 VITALS — BP 141/58 | HR 89 | Temp 97.8°F | Resp 20 | Ht 64.5 in | Wt 168.1 lb

## 2017-03-25 DIAGNOSIS — R350 Frequency of micturition: Secondary | ICD-10-CM

## 2017-03-25 DIAGNOSIS — Z7952 Long term (current) use of systemic steroids: Secondary | ICD-10-CM | POA: Diagnosis not present

## 2017-03-25 DIAGNOSIS — M1990 Unspecified osteoarthritis, unspecified site: Secondary | ICD-10-CM | POA: Insufficient documentation

## 2017-03-25 DIAGNOSIS — Z79899 Other long term (current) drug therapy: Secondary | ICD-10-CM | POA: Diagnosis not present

## 2017-03-25 DIAGNOSIS — M8580 Other specified disorders of bone density and structure, unspecified site: Secondary | ICD-10-CM | POA: Insufficient documentation

## 2017-03-25 DIAGNOSIS — Z90710 Acquired absence of both cervix and uterus: Secondary | ICD-10-CM

## 2017-03-25 DIAGNOSIS — E119 Type 2 diabetes mellitus without complications: Secondary | ICD-10-CM

## 2017-03-25 DIAGNOSIS — J449 Chronic obstructive pulmonary disease, unspecified: Secondary | ICD-10-CM | POA: Insufficient documentation

## 2017-03-25 DIAGNOSIS — Z87891 Personal history of nicotine dependence: Secondary | ICD-10-CM | POA: Diagnosis not present

## 2017-03-25 DIAGNOSIS — Z923 Personal history of irradiation: Secondary | ICD-10-CM | POA: Insufficient documentation

## 2017-03-25 DIAGNOSIS — C50911 Malignant neoplasm of unspecified site of right female breast: Secondary | ICD-10-CM

## 2017-03-25 DIAGNOSIS — Z880 Allergy status to penicillin: Secondary | ICD-10-CM | POA: Diagnosis not present

## 2017-03-25 DIAGNOSIS — Z79811 Long term (current) use of aromatase inhibitors: Secondary | ICD-10-CM | POA: Insufficient documentation

## 2017-03-25 DIAGNOSIS — Z9981 Dependence on supplemental oxygen: Secondary | ICD-10-CM | POA: Diagnosis not present

## 2017-03-25 DIAGNOSIS — I10 Essential (primary) hypertension: Secondary | ICD-10-CM | POA: Diagnosis not present

## 2017-03-25 DIAGNOSIS — R232 Flushing: Secondary | ICD-10-CM | POA: Insufficient documentation

## 2017-03-25 DIAGNOSIS — Z800 Family history of malignant neoplasm of digestive organs: Secondary | ICD-10-CM | POA: Diagnosis not present

## 2017-03-25 DIAGNOSIS — R300 Dysuria: Secondary | ICD-10-CM | POA: Diagnosis not present

## 2017-03-25 DIAGNOSIS — Z170 Estrogen receptor positive status [ER+]: Secondary | ICD-10-CM | POA: Diagnosis not present

## 2017-03-25 DIAGNOSIS — C50411 Malignant neoplasm of upper-outer quadrant of right female breast: Secondary | ICD-10-CM

## 2017-03-25 DIAGNOSIS — R5383 Other fatigue: Secondary | ICD-10-CM | POA: Insufficient documentation

## 2017-03-25 DIAGNOSIS — E7800 Pure hypercholesterolemia, unspecified: Secondary | ICD-10-CM | POA: Diagnosis not present

## 2017-03-25 LAB — CBC WITH DIFFERENTIAL/PLATELET
Basophils Absolute: 0 10*3/uL (ref 0.0–0.1)
Basophils Relative: 0 %
Eosinophils Absolute: 0.2 10*3/uL (ref 0.0–0.7)
Eosinophils Relative: 2 %
HCT: 39.9 % (ref 36.0–46.0)
Hemoglobin: 12.7 g/dL (ref 12.0–15.0)
LYMPHS ABS: 2.1 10*3/uL (ref 0.7–4.0)
Lymphocytes Relative: 29 %
MCH: 30.6 pg (ref 26.0–34.0)
MCHC: 31.8 g/dL (ref 30.0–36.0)
MCV: 96.1 fL (ref 78.0–100.0)
Monocytes Absolute: 0.8 10*3/uL (ref 0.1–1.0)
Monocytes Relative: 10 %
Neutro Abs: 4.3 10*3/uL (ref 1.7–7.7)
Neutrophils Relative %: 59 %
Platelets: 301 10*3/uL (ref 150–400)
RBC: 4.15 MIL/uL (ref 3.87–5.11)
RDW: 13.3 % (ref 11.5–15.5)
WBC: 7.4 10*3/uL (ref 4.0–10.5)

## 2017-03-25 LAB — COMPREHENSIVE METABOLIC PANEL
ALT: 37 U/L (ref 14–54)
ANION GAP: 12 (ref 5–15)
AST: 36 U/L (ref 15–41)
Albumin: 4.1 g/dL (ref 3.5–5.0)
Alkaline Phosphatase: 70 U/L (ref 38–126)
BUN: 27 mg/dL — ABNORMAL HIGH (ref 6–20)
CHLORIDE: 100 mmol/L — AB (ref 101–111)
CO2: 25 mmol/L (ref 22–32)
CREATININE: 1.13 mg/dL — AB (ref 0.44–1.00)
Calcium: 9.9 mg/dL (ref 8.9–10.3)
GFR calc non Af Amer: 43 mL/min — ABNORMAL LOW (ref >60)
GFR, EST AFRICAN AMERICAN: 50 mL/min — AB (ref >60)
Glucose, Bld: 88 mg/dL (ref 65–99)
Potassium: 4.1 mmol/L (ref 3.5–5.1)
Sodium: 137 mmol/L (ref 135–145)
Total Bilirubin: 0.4 mg/dL (ref 0.3–1.2)
Total Protein: 7 g/dL (ref 6.5–8.1)

## 2017-03-25 MED ORDER — CIPROFLOXACIN HCL 250 MG PO TABS: 250.0000 mg | ORAL_TABLET | Freq: Two times a day (BID) | ORAL | 0 refills | Status: DC

## 2017-03-26 ENCOUNTER — Encounter (HOSPITAL_COMMUNITY): Payer: Self-pay | Admitting: Adult Health

## 2017-03-26 NOTE — Progress Notes (Signed)
[place], [place] [address]   CLINIC:  Medical Oncology/Hematology  PCP:  Kathyrn Drown, MD [address] [place] [address] [phone]   REASON FOR VISIT:  Follow-up for Stage I invasive ductal carcinoma of (R) breast; ER+/PR-/HER2-  CURRENT THERAPY: Arimidex po daily, since 08/2015   BRIEF ONCOLOGIC HISTORY:    Breast cancer, right breast ([place])   04/22/2015 Procedure    Right breast needle core biopsy      04/24/2015 Pathology Results    Invasive ductal carcinoma, grade 1.  ER100%, PR NEGATIVE, Ki-67 10%, HER2 NEGATIVE      05/23/2015 Procedure    Right breast lumpectomy by Dr. Dalbert Batman      05/27/2015 Pathology Results    Breast, lumpectomy, Right - INVASIVE GRADE I DUCTAL CARCINOMA SPANNING 0.7 CM IN GREATEST LINEAR DIMENSION. - ASSOCIATED LOW GRADE DUCTAL CARCINOMA IN SITU. - TUMOR SHOWS ASSOCIATED CALCIFICATIONS. - LOBULAR CARCINOMA IN SITU WITH ASSOCIATED CALCIFICATIONS. - MARGINS ARE NEGATIVE.       - 07/23/2015 Radiation Therapy    Dr. Pablo Ledger      08/01/2015 Imaging    Bone density- BMD as determined from Femur Neck Left is 0.891 g/cm2 with a T-Score of -1.1. This patient is considered osteopenic according to [facility]) criteria.      08/14/2015 -  Anti-estrogen oral therapy    Arimidex      08/02/2016 Imaging    Bone density- This patient is considered osteopenic according to [facility]) criteria.         HISTORY OF PRESENT ILLNESS:  (From Dr. Laverle Patter note on 09/22/16)      INTERVAL HISTORY:  [name] 84 y.o. female returns for routine follow-up for right breast cancer.   Overall, she tells me she has been feeling pretty well. Appetite 100%; energy levels 75%. She tells me that she generally has to take a short 10-30 minute nap every day after lunch. She does not like needing to take naps during the day. She feels like she sleeps well at night;  she fells me that she wakes feeling rested in the mornings.  She uses O2 at home; her lung disease is followed by Dr. Lake Bells. She becomes tearful telling me that her doctors think she may benefit from having portable O2 to use when she is away from home; "I'm just not ready to say that I need that yet."  Reports easy bruising.   Remains on Arimidex daily. She has hot flashes, but they are manageable. Denies any vaginal dryness or vaginal bleeding. She has some arthralgias, which she attributes to arthritis, but it is also manageable.  Denies any new breast complaints or concerns.   Reports ~2 week h/o urinary frequency and dysuria. She has been trying to manage with drinking cranberry juice; her symptoms will improve and then will recur a few days later. Denies any fever/chills. Denies abd or suprapubic pain. She has not been to her PCP about this complaint.   Otherwise, she is largely without complaints.    REVIEW OF SYSTEMS:  Review of Systems  Constitutional: Positive for fatigue. Negative for chills and fever.  HENT:  Negative.   Eyes: Negative.   Respiratory: Positive for shortness of breath.   Cardiovascular: Negative.   Gastrointestinal: Negative.   Endocrine: Positive for hot flashes.  Genitourinary: Positive for dysuria and frequency. Negative for vaginal bleeding.   Musculoskeletal: Positive for arthralgias.  Skin: Negative.  Neurological: Negative.   Hematological: Bruises/bleeds easily.  Psychiatric/Behavioral: Negative.      PAST MEDICAL/SURGICAL HISTORY:  Past Medical History:  Diagnosis Date  . Breast cancer, right breast ([place]) 05/01/2015  . Cancer (HCC)    BREAST CANCER   . Colon polyps   . COPD (chronic obstructive pulmonary disease) ([place])    DR. Lake Bells  . Diabetes mellitus without complication ([place])    PREDIABETIC  CONTROL W/ DIET  . Hypercholesteremia   . Hypertension   . Lung nodules   . Osteoarthritis   . Osteopenia 09/18/2013  . Osteoporosis   . PONV  (postoperative nausea and vomiting)   . Shortness of breath dyspnea    W/ EXERTION   Past Surgical History:  Procedure Laterality Date  . ABDOMINAL HYSTERECTOMY    . APPENDECTOMY    . BARTHOLIN GLAND CYST EXCISION    . BREAST LUMPECTOMY WITH RADIOACTIVE SEED LOCALIZATION Right 05/23/2015   Procedure: RIGHT BREAST LUMPECTOMY WITH RADIOACTIVE SEED LOCALIZATION;  Surgeon: Fanny Skates, MD;  Location: [facility];  Service: General;  Laterality: Right;  . BREAST SURGERY     2000  LEFT BREAST  LUMP REMOVED  . colonoscopy    . COLONOSCOPY  03/25/2011   Procedure: COLONOSCOPY;  Surgeon: Rogene Houston, MD;  [facility];  Service: Endoscopy;  Laterality: N/A;  9:30 am  . left inguinal hernia repair    . multiple toe surgeries    . TONSILLECTOMY       SOCIAL HISTORY:  Social History   Socioeconomic History  . Marital status: Widowed    Spouse name: Not on file  . Number of children: Not on file  . Years of education: Not on file  . Highest education level: Not on file  Social Needs  . Financial resource strain: Not on file  . Food insecurity - worry: Not on file  . Food insecurity - inability: Not on file  . Transportation needs - medical: Not on file  . Transportation needs - non-medical: Not on file  Occupational History  . Not on file  Tobacco Use  . Smoking status: Former Smoker    Packs/day: 2.00    Years: 50.00    Pack years: 100.00    Types: Cigarettes    Last attempt to quit: 03/15/1988    Years since quitting: 29.0  . Smokeless tobacco: Never Used  Substance and Sexual Activity  . Alcohol use: No    Alcohol/week: 0.0 oz  . Drug use: No  . Sexual activity: Not on file  Other Topics Concern  . Not on file  Social History Narrative  . Not on file    FAMILY HISTORY:  Family History  Problem Relation Age of Onset  . Colon cancer Father     CURRENT MEDICATIONS:  Outpatient Encounter Medications as of 03/25/2017  Medication Sig  . Albuterol Sulfate  (PROAIR RESPICLICK) 601 (90 Base) MCG/ACT AEPB Inhale 1 puff into the lungs every 4 (four) hours as needed (Wheezing, chest tightness).  Marland Kitchen anastrozole (ARIMIDEX) 1 MG tablet TAKE ONE TABLET BY MOUTH ONCE DAILY.  . Bilberry, Vaccinium myrtillus, (BILBERRY PO) Take 1 Dose by mouth daily.   . fluticasone (FLONASE) 50 MCG/ACT nasal spray Place 2 sprays into both nostrils daily. Sometimes uses a second dose qhs  . furosemide (LASIX) 20 MG tablet TAKE ONE TABLET BY MOUTH EVERY MORNING.  Marland Kitchen lisinopril (PRINIVIL,ZESTRIL) 20 MG tablet TAKE (1) TABLET BY MOUTH ONCE DAILY.  . Multiple Vitamins-Minerals (CENTRUM SILVER  ADULT 50+ PO) Take by mouth.  . nortriptyline (PAMELOR) 10 MG capsule Take 2-3 at night  . polyethylene glycol (MIRALAX / GLYCOLAX) packet Take 17 g by mouth daily as needed. Constipation  . predniSONE (DELTASONE) 10 MG tablet Take 2 tablets once daily for the next 5 days.  . predniSONE (DELTASONE) 10 MG tablet Take 4 tabs x 2 days, then 2 tabs x 2 days, then 1 tab x 2 days, then stop.  Marland Kitchen STIOLTO RESPIMAT 2.5-2.5 MCG/ACT AERS INHALE 2 PUFFS INTO THE LUNGS ONCE DAILY.  . ciprofloxacin (CIPRO) 250 MG tablet Take 1 tablet (250 mg total) by mouth 2 (two) times daily.  . [DISCONTINUED] lisinopril (PRINIVIL,ZESTRIL) 20 MG tablet TAKE ONE TABLET BY MOUTH ONCE DAILY.   No facility-administered encounter medications on file as of 03/25/2017.     ALLERGIES:  Allergies  Allergen Reactions  . Augmentin [Amoxicillin-Pot Clavulanate] Nausea And Vomiting  . Amoxicillin Diarrhea  . Codeine Nausea And Vomiting  . Morphine Nausea And Vomiting  . Neomycin-Bacitracin Zn-Polymyx Itching and Rash    Unable to use Neosporin     PHYSICAL EXAM:  ECOG Performance status: 1 - Symptomatic; remains independent   Vitals:   03/25/17 1419  BP: (!) 141/58  Pulse: 89  Resp: 20  Temp: 97.8 F (36.6 C)  SpO2: 95%   Filed Weights   03/25/17 1419  Weight: 168 lb 1.6 oz (76.2 kg)    Physical Exam    Constitutional: She is oriented to person, place, and time and well-developed, well-nourished, and in no distress.  HENT:  Head: Normocephalic.  Mouth/Throat: Oropharynx is clear and moist. No oropharyngeal exudate.  Eyes: Conjunctivae are normal. Pupils are equal, round, and reactive to light. No scleral icterus.  Neck: Normal range of motion. Neck supple.  Cardiovascular: Normal rate and regular rhythm.  Pulmonary/Chest: Effort normal and breath sounds normal. No respiratory distress.    Abdominal: Soft. Bowel sounds are normal. There is no tenderness.  Musculoskeletal: Normal range of motion. She exhibits no edema.  Lymphadenopathy:    She has no cervical adenopathy.       Right: No supraclavicular adenopathy present.       Left: No supraclavicular adenopathy present.  Neurological: She is alert and oriented to person, place, and time. No cranial nerve deficit. Gait normal.  Skin: Skin is warm and dry. No rash noted.  Psychiatric: Mood, memory, affect and judgment normal.  Nursing note and vitals reviewed.    LABORATORY DATA:  I have reviewed the labs as listed.  CBC    Component Value Date/Time   WBC 7.4 03/25/2017 1320   RBC 4.15 03/25/2017 1320   HGB 12.7 03/25/2017 1320   HGB 13.0 11/22/2016 1509   HCT 39.9 03/25/2017 1320   HCT 39.2 11/22/2016 1509   PLT 301 03/25/2017 1320   PLT 277 11/22/2016 1509   MCV 96.1 03/25/2017 1320   MCV 90 11/22/2016 1509   MCH 30.6 03/25/2017 1320   MCHC 31.8 03/25/2017 1320   RDW 13.3 03/25/2017 1320   RDW 14.0 11/22/2016 1509   LYMPHSABS 2.1 03/25/2017 1320   LYMPHSABS 1.7 11/22/2016 1509   MONOABS 0.8 03/25/2017 1320   EOSABS 0.2 03/25/2017 1320   EOSABS 0.2 11/22/2016 1509   BASOSABS 0.0 03/25/2017 1320   BASOSABS 0.0 11/22/2016 1509   CMP Latest Ref Rng & Units 03/25/2017 11/25/2016 11/22/2016  Glucose 65 - 99 mg/dL 88 - -  BUN 6 - 20 mg/dL 27(H) - -  Creatinine 0.44 -  1.00 mg/dL 1.13(H) - -  Sodium 135 - 145 mmol/L 137 -  -  Potassium 3.5 - 5.1 mmol/L 4.1 - -  Chloride 101 - 111 mmol/L 100(L) - -  CO2 22 - 32 mmol/L 25 - -  Calcium 8.9 - 10.3 mg/dL 9.9 10.3 10.9(H)  Total Protein 6.5 - 8.1 g/dL 7.0 - -  Total Bilirubin 0.3 - 1.2 mg/dL 0.4 - -  Alkaline Phos 38 - 126 U/L 70 - -  AST 15 - 41 U/L 36 - -  ALT 14 - 54 U/L 37 - -    PENDING LABS:    DIAGNOSTIC IMAGING:  *The following radiologic images and reports have been reviewed independently and agree with below findings.  Last mammogram: 06/08/16 CLINICAL DATA:  84 year old female with history of right breast cancer post lumpectomy 05/23/2015 and radiation therapy.  EXAM: 2D DIGITAL DIAGNOSTIC BILATERAL MAMMOGRAM WITH CAD AND ADJUNCT TOMO  COMPARISON:  Previous exam(s).  ACR Breast Density Category b: There are scattered areas of fibroglandular density.  FINDINGS: No suspicious masses or calcifications are seen in either breast. Postoperative changes are present in the upper-outer posterior right breast related to interval lumpectomy. Spot compression magnification tangential view of the lumpectomy site in the right breast was performed. There is no mammographic evidence of locally recurrent malignancy.  Mammographic images were processed with CAD.  IMPRESSION: New lumpectomy site right breast. No mammographic evidence of malignancy in either breast.  RECOMMENDATION: Diagnostic mammogram is suggested in 1 year. (Code:[id number]-B-01Y)  I have discussed the findings and recommendations with the patient. Results were also provided in writing at the conclusion of the visit. If applicable, a reminder letter will be sent to the patient regarding the next appointment.  BI-RADS CATEGORY  2: Benign.   Electronically Signed   By: Everlean Alstrom M.D.   On: 06/08/2016 08:29    PATHOLOGY:  (R) lumpectomy surgical path: 05/23/15        ASSESSMENT & PLAN:   Stage I invasive ductal carcinoma of (R) breast; ER+/PR-/HER2-:    -Diagnosed in 04/2015. Treated with (R) lumpectomy by Dr. Brantley Stage, followed by adjuvant breast radiation by Dr. Pablo Ledger. Started anti-estrogen therapy with Arimidex in 08/2015.  -Remains on Arimidex with good tolerance. Planned duration of treatment is 5-10 years.  -Clinical breast exam performed today and benign.  -Bilat diagnostic mammogram due in 05/2017; orders placed today.  -Return to cancer center in 6 months for follow-up with labs.   Bone health:  -Last DEXA scan on 08/02/16 showed osteopenia with T-score -1.3. She was treated with Fosamax for ~6-7 years.  -Recommended calcium/vitamin D supplementation and increased weight bearing exercises as tolerated.   Urinary frequency/Dysuria:  -Clinical symptoms concerning for UTI. Will collect UA and urine culture today.  -Given that her symptoms have persisted for 2 weeks, I will go ahead and treat her empirically with Cipro 250 mg BID x 5 days. If her urine culture shows specific bacterial susceptibilities requiring antibiotic change, then we will contact her with new prescription.   Addendum:       *Unfortunately, the pt did collect a urine sample in clinic today but the specimen did not make it to the lab.  I will have nursing give her a call early next week to see if her symptoms have improved. If her symptoms recur, then will either bring her back in to clinic to re-collect urine and/or encourage her to follow-up with her PCP.       Dispo:  -Mammogram  in 05/2017; orders placed today.  -Return to cancer center in 6 months for follow-up with labs.    All questions were answered to patient's stated satisfaction. Encouraged patient to call with any new concerns or questions before her next visit to the cancer center and we can certain see her sooner, if needed.      Orders placed this encounter:  Orders Placed This Encounter  Procedures  . Urine Culture  . MM DIAG BREAST TOMO BILATERAL  . Urinalysis, Routine w reflex microscopic   . CBC with Differential/Platelet  . Comprehensive metabolic panel      Mike Craze, NP [place] [phone]

## 2017-03-28 ENCOUNTER — Telehealth (HOSPITAL_COMMUNITY): Payer: Self-pay | Admitting: *Deleted

## 2017-03-28 NOTE — Telephone Encounter (Signed)
I spoke with the pt and she stated that the burning she had is gone and she feels like she is emptying her bladder better than before. The pt stated that she had noticed that she was still going to the bathroom a little more than usual but thinks that it may be due to her fluid pill. Pt stated that she had antibiotics to take until Wednesday, I informed the pt that I would call her back Wednesday and check on her to make sure she is still feeling better. Pt verbalized understanding.

## 2017-03-28 NOTE — Telephone Encounter (Signed)
-----   Message from Holley Bouche, NP sent at 03/26/2017  4:43 PM EST ----- Regarding: Call pt to follow-up about her UTI symptoms Hey there,   Just a reminder to please give [name] a call to see if her urinary symptoms are better since starting antibiotics.    gwd

## 2017-03-30 DIAGNOSIS — M25561 Pain in right knee: Secondary | ICD-10-CM | POA: Diagnosis not present

## 2017-03-30 DIAGNOSIS — M1711 Unilateral primary osteoarthritis, right knee: Secondary | ICD-10-CM | POA: Diagnosis not present

## 2017-03-31 DIAGNOSIS — Z170 Estrogen receptor positive status [ER+]: Secondary | ICD-10-CM | POA: Diagnosis not present

## 2017-03-31 DIAGNOSIS — C50411 Malignant neoplasm of upper-outer quadrant of right female breast: Secondary | ICD-10-CM | POA: Diagnosis not present

## 2017-04-01 ENCOUNTER — Other Ambulatory Visit (HOSPITAL_COMMUNITY): Payer: Self-pay | Admitting: Oncology

## 2017-04-27 ENCOUNTER — Encounter: Payer: Medicare Other | Admitting: Family Medicine

## 2017-04-28 DIAGNOSIS — H35373 Puckering of macula, bilateral: Secondary | ICD-10-CM | POA: Diagnosis not present

## 2017-04-28 DIAGNOSIS — H25813 Combined forms of age-related cataract, bilateral: Secondary | ICD-10-CM | POA: Diagnosis not present

## 2017-04-28 DIAGNOSIS — H40013 Open angle with borderline findings, low risk, bilateral: Secondary | ICD-10-CM | POA: Diagnosis not present

## 2017-05-25 ENCOUNTER — Ambulatory Visit (INDEPENDENT_AMBULATORY_CARE_PROVIDER_SITE_OTHER): Payer: Medicare Other | Admitting: Pulmonary Disease

## 2017-05-25 ENCOUNTER — Encounter: Payer: Self-pay | Admitting: Pulmonary Disease

## 2017-05-25 VITALS — BP 102/62 | HR 58 | Ht 64.5 in | Wt 166.0 lb

## 2017-05-25 DIAGNOSIS — J449 Chronic obstructive pulmonary disease, unspecified: Secondary | ICD-10-CM

## 2017-05-25 DIAGNOSIS — J9611 Chronic respiratory failure with hypoxia: Secondary | ICD-10-CM

## 2017-05-25 DIAGNOSIS — R911 Solitary pulmonary nodule: Secondary | ICD-10-CM

## 2017-05-25 NOTE — Progress Notes (Signed)
Subjective:    Patient ID: [name], female    DOB: 12/31/1928, 84 y.o.   MRN: [id number]  Synopsis  COPD, history of breast cancer; Referred in 2016 for chronic cough. Cough had resolved by the time she saw me. She been followed by [facility] for chronic cough for many years prior felt to be related to sinus congestion. She also had pulmonary nodules and some pleural calcification which was stable on chest imaging through 2012 there. After I saw her we did simple spirometry testing which showed clear airflow obstruction with an FEV1 of 48% predicted. However, the FVC was low at 1.38 L (54% predicted).  She smoked 2 packs a day for many years.  She was diagnosed with breast cancer in March 2017 and treated with lumpectomy, radiation therapy, and Arimidex  HPI Chief Complaint  Patient presents with  . Follow-up    SOB with activity, coughing a little bit not very much.    Other than a mild flare up in November she has been doing well. She says that she has been dong well lately.  She has been trying to staying in the house and wash her hands a lot to try to avoid the flu.  She had her flu shot early in the year.   She has some mild dyspnea when she walks around.  She is wearing her oxygen in th house but not outside the house.   Past Medical History:  Diagnosis Date  . Breast cancer, right breast ([place]) 05/01/2015  . Cancer (HCC)    BREAST CANCER   . Colon polyps   . COPD (chronic obstructive pulmonary disease) ([place])    DR. Lake Bells  . Diabetes mellitus without complication ([place])    PREDIABETIC  CONTROL W/ DIET  . Hypercholesteremia   . Hypertension   . Lung nodules   . Osteoarthritis   . Osteopenia 09/18/2013  . Osteoporosis   . PONV (postoperative nausea and vomiting)   . Shortness of breath dyspnea    W/ EXERTION      Review of Systems  Constitutional: Negative for chills, fatigue and fever.  HENT: Negative for rhinorrhea, sinus pressure and sinus pain.     Respiratory: Positive for shortness of breath. Negative for choking and wheezing.   Cardiovascular: Negative for chest pain, palpitations and leg swelling.       Objective:   Physical Exam Vitals:   05/25/17 1009 05/25/17 1010  BP:  102/62  Pulse:  (!) 58  SpO2:  97%  Weight: 166 lb (75.3 kg)   Height: 5' 4.5" (1.638 m)     RA  Gen: well appearing HENT: OP clear, TM's clear, neck supple PULM: CTA B, normal percussion CV: RRR, no mgr, trace edema GI: BS+, soft, nontender Derm: no cyanosis or rash Psyche: normal mood and affect     CBC    Component Value Date/Time   WBC 7.4 03/25/2017 1320   RBC 4.15 03/25/2017 1320   HGB 12.7 03/25/2017 1320   HGB 13.0 11/22/2016 1509   HCT 39.9 03/25/2017 1320   HCT 39.2 11/22/2016 1509   PLT 301 03/25/2017 1320   PLT 277 11/22/2016 1509   MCV 96.1 03/25/2017 1320   MCV 90 11/22/2016 1509   MCH 30.6 03/25/2017 1320   MCHC 31.8 03/25/2017 1320   RDW 13.3 03/25/2017 1320   RDW 14.0 11/22/2016 1509   LYMPHSABS 2.1 03/25/2017 1320   LYMPHSABS 1.7 11/22/2016 1509   MONOABS 0.8 03/25/2017 1320  EOSABS 0.2 03/25/2017 1320   EOSABS 0.2 11/22/2016 1509   BASOSABS 0.0 03/25/2017 1320   BASOSABS 0.0 11/22/2016 1509   PFT: 2016 spirometry: Clear airflow obstruction,Ratio 63%  FEV1 0.88 L, 48% predicted  Chest x-ray from the last visit reviewed showing a right upper lobe nodule versus area of scarring, does not seem to have changed significantly on today's chest x-ray  Chest imaging PET 07/2016 nodule not hypermetabolic 25/4270 Chest CT: RLL nodule 75mm, stable; RUL nodule 65mm, stable; rec 6-12 month f/u for the RUL nodule, q2 year follow up for 5 years for the RLL nodule     Assessment & Plan:   Chronic respiratory failure with hypoxia (HCC)  Solitary pulmonary nodule - Plan: CT Chest Wo Contrast  Chronic obstructive pulmonary disease, unspecified COPD type ([place])  Discussion: Aside from one mild exacerbation in November  this is been a stable interval for bili.  However, she has noted that she slowed down some which she attributes to not wearing the oxygen when she is outside the house.  She is still contemplating whether or not she would like to use oxygen in the form of a portable oxygen concentrator.  We spent a significant amount of time today talking about that.  Her pulmonary nodules appear to be stable but the newer one in the right upper lobe needs to be imaged again to make sure that it is not changed.  Plan: COPD: Continue Stiolto 2 puffs daily Continue to practice good hand hygiene Stay active  Chronic respiratory failure with hypoxemia: We will have you walk on a portable oxygen concentrator today The best device available on the market is from a company called [employer] Let me know if you would like a prescription for a portable oxygen concentrator In the meantime, continue using 2 L of oxygen with your home concentrator when you exert yourself  Pulmonary nodule: You have 2 nodules, one in the right upper lobe and one in the right lower lobe We need to get a repeat CT scan of the chest in May 2019 to make sure that the newer right upper lobe nodule is unchanged If there is no change in the right upper lobe nodule then we will plan to follow these with images every 2 years for a total of 5 years  We will see you back in 4-6 months or sooner if needed  Greater than 50% of this 28-minute visit spent face-to-face    Current Outpatient Medications:  .  Albuterol Sulfate (PROAIR RESPICLICK) 623 (90 Base) MCG/ACT AEPB, Inhale 1 puff into the lungs every 4 (four) hours as needed (Wheezing, chest tightness)., Disp: 1 each, Rfl: 5 .  anastrozole (ARIMIDEX) 1 MG tablet, TAKE ONE TABLET BY MOUTH ONCE DAILY., Disp: 30 tablet, Rfl: 5 .  anastrozole (ARIMIDEX) 1 MG tablet, TAKE ONE TABLET BY MOUTH ONCE DAILY., Disp: 90 tablet, Rfl: 1 .  Bilberry, Vaccinium myrtillus, (BILBERRY PO), Take 1 Dose by mouth daily.  , Disp: , Rfl:  .  ciprofloxacin (CIPRO) 250 MG tablet, Take 1 tablet (250 mg total) by mouth 2 (two) times daily., Disp: 10 tablet, Rfl: 0 .  fluticasone (FLONASE) 50 MCG/ACT nasal spray, Place 2 sprays into both nostrils daily. Sometimes uses a second dose qhs, Disp: , Rfl:  .  furosemide (LASIX) 20 MG tablet, TAKE ONE TABLET BY MOUTH EVERY MORNING., Disp: 90 tablet, Rfl: 1 .  lisinopril (PRINIVIL,ZESTRIL) 20 MG tablet, TAKE (1) TABLET BY MOUTH ONCE DAILY., Disp: 90 tablet, Rfl:  1 .  Multiple Vitamins-Minerals (CENTRUM SILVER ADULT 50+ PO), Take by mouth., Disp: , Rfl:  .  nortriptyline (PAMELOR) 10 MG capsule, Take 2-3 at night, Disp: , Rfl:  .  polyethylene glycol (MIRALAX / GLYCOLAX) packet, Take 17 g by mouth daily as needed. Constipation, Disp: , Rfl:  .  predniSONE (DELTASONE) 10 MG tablet, Take 2 tablets once daily for the next 5 days., Disp: 10 tablet, Rfl: 0 .  predniSONE (DELTASONE) 10 MG tablet, Take 4 tabs x 2 days, then 2 tabs x 2 days, then 1 tab x 2 days, then stop., Disp: 14 tablet, Rfl: 0 .  STIOLTO RESPIMAT 2.5-2.5 MCG/ACT AERS, INHALE 2 PUFFS INTO THE LUNGS ONCE DAILY., Disp: 4 g, Rfl: 5

## 2017-05-25 NOTE — Patient Instructions (Signed)
COPD: Continue Stiolto 2 puffs daily Continue to practice good hand hygiene Stay active  Chronic respiratory failure with hypoxemia: We will have you walk on a portable oxygen concentrator today The best device available on the market is from a company called [employer] Let me know if you would like a prescription for a portable oxygen concentrator In the meantime, continue using 2 L of oxygen with your home concentrator when you exert yourself  Pulmonary nodule: You have 2 nodules, one in the right upper lobe and one in the right lower lobe We need to get a repeat CT scan of the chest in May 2019 to make sure that the newer right upper lobe nodule is unchanged If there is no change in the right upper lobe nodule then we will plan to follow these with images every 2 years for a total of 5 years  We will see you back in 4-6 months or sooner if needed

## 2017-05-30 DIAGNOSIS — L910 Hypertrophic scar: Secondary | ICD-10-CM | POA: Diagnosis not present

## 2017-05-30 DIAGNOSIS — L814 Other melanin hyperpigmentation: Secondary | ICD-10-CM | POA: Diagnosis not present

## 2017-05-30 DIAGNOSIS — L988 Other specified disorders of the skin and subcutaneous tissue: Secondary | ICD-10-CM | POA: Diagnosis not present

## 2017-05-30 DIAGNOSIS — L821 Other seborrheic keratosis: Secondary | ICD-10-CM | POA: Diagnosis not present

## 2017-05-30 DIAGNOSIS — D225 Melanocytic nevi of trunk: Secondary | ICD-10-CM | POA: Diagnosis not present

## 2017-05-31 ENCOUNTER — Encounter (HOSPITAL_COMMUNITY): Payer: Medicare Other

## 2017-06-07 ENCOUNTER — Encounter (HOSPITAL_COMMUNITY): Payer: Self-pay

## 2017-06-07 ENCOUNTER — Ambulatory Visit (HOSPITAL_COMMUNITY)
Admission: RE | Admit: 2017-06-07 | Discharge: 2017-06-07 | Disposition: A | Discharge status: Home / Self Care | Payer: Medicare Other | Source: Ambulatory Visit | Attending: Adult Health | Admitting: Adult Health

## 2017-06-07 DIAGNOSIS — Z923 Personal history of irradiation: Secondary | ICD-10-CM | POA: Diagnosis not present

## 2017-06-07 DIAGNOSIS — C50911 Malignant neoplasm of unspecified site of right female breast: Secondary | ICD-10-CM | POA: Diagnosis not present

## 2017-06-07 DIAGNOSIS — Z170 Estrogen receptor positive status [ER+]: Secondary | ICD-10-CM | POA: Insufficient documentation

## 2017-06-07 DIAGNOSIS — Z1231 Encounter for screening mammogram for malignant neoplasm of breast: Secondary | ICD-10-CM | POA: Insufficient documentation

## 2017-06-07 DIAGNOSIS — R928 Other abnormal and inconclusive findings on diagnostic imaging of breast: Secondary | ICD-10-CM | POA: Diagnosis not present

## 2017-06-07 HISTORY — DX: Personal history of irradiation: Z92.3

## 2017-06-07 HISTORY — DX: Malignant neoplasm of unspecified site of unspecified female breast: C50.919

## 2017-06-10 ENCOUNTER — Telehealth: Payer: Self-pay

## 2017-06-10 NOTE — Telephone Encounter (Signed)
Spoke with pt, aware of BQ's recs.  Nothing further needed.  

## 2017-06-10 NOTE — Telephone Encounter (Signed)
-----   Message from Juanito Doom, MD sent at 06/10/2017  8:31 AM EDT ----- Hi,  Can you let her know I received her note and I prefer the [employer] device.  Thanks, B

## 2017-06-14 DIAGNOSIS — J449 Chronic obstructive pulmonary disease, unspecified: Secondary | ICD-10-CM | POA: Diagnosis not present

## 2017-06-14 DIAGNOSIS — C50111 Malignant neoplasm of central portion of right female breast: Secondary | ICD-10-CM | POA: Diagnosis not present

## 2017-06-14 DIAGNOSIS — I10 Essential (primary) hypertension: Secondary | ICD-10-CM | POA: Diagnosis not present

## 2017-06-15 ENCOUNTER — Telehealth: Payer: Self-pay | Admitting: Family Medicine

## 2017-06-15 NOTE — Telephone Encounter (Signed)
Metabolic 7, lipid, liver, hemoglobin A1c-diabetes hyperlipidemia

## 2017-06-15 NOTE — Telephone Encounter (Signed)
Pt is requesting lab orders to be sent over for an upcoming appt. Last labs per Epic were: vit d,and pth on 11/25/2016

## 2017-06-16 ENCOUNTER — Other Ambulatory Visit: Payer: Self-pay | Admitting: Family Medicine

## 2017-06-16 DIAGNOSIS — E782 Mixed hyperlipidemia: Secondary | ICD-10-CM

## 2017-06-16 DIAGNOSIS — Z0000 Encounter for general adult medical examination without abnormal findings: Secondary | ICD-10-CM

## 2017-06-16 DIAGNOSIS — R7303 Prediabetes: Secondary | ICD-10-CM

## 2017-06-16 NOTE — Telephone Encounter (Signed)
Labs placed, tried to contact patient, left voicemail to return call

## 2017-06-17 ENCOUNTER — Telehealth: Payer: Self-pay | Admitting: Pulmonary Disease

## 2017-06-17 DIAGNOSIS — J431 Panlobular emphysema: Secondary | ICD-10-CM

## 2017-06-17 NOTE — Telephone Encounter (Signed)
Spoke with pt. States that she would like to have a POC from [facility]. Reports that her and Dr. Lake Bells discussed this at her last OV. Order has been placed. Nothing further was needed.

## 2017-06-17 NOTE — Telephone Encounter (Signed)
Returned call; informed pt of labs order placed

## 2017-06-21 DIAGNOSIS — E782 Mixed hyperlipidemia: Secondary | ICD-10-CM | POA: Diagnosis not present

## 2017-06-21 DIAGNOSIS — R7303 Prediabetes: Secondary | ICD-10-CM | POA: Diagnosis not present

## 2017-06-21 DIAGNOSIS — Z0000 Encounter for general adult medical examination without abnormal findings: Secondary | ICD-10-CM | POA: Diagnosis not present

## 2017-06-22 LAB — LIPID PANEL
CHOLESTEROL TOTAL: 225 mg/dL — AB (ref 100–199)
Chol/HDL Ratio: 3 ratio (ref 0.0–4.4)
HDL: 76 mg/dL (ref >39)
LDL Calculated: 125 mg/dL — ABNORMAL HIGH (ref 0–99)
Triglycerides: 118 mg/dL (ref 0–149)
VLDL Cholesterol Cal: 24 mg/dL (ref 5–40)

## 2017-06-22 LAB — BASIC METABOLIC PANEL
BUN/Creatinine Ratio: 21 (ref 12–28)
BUN: 22 mg/dL (ref 8–27)
CALCIUM: 10.3 mg/dL (ref 8.7–10.3)
CO2: 25 mmol/L (ref 20–29)
Chloride: 104 mmol/L (ref 96–106)
Creatinine, Ser: 1.04 mg/dL — ABNORMAL HIGH (ref 0.57–1.00)
GFR calc Af Amer: 56 mL/min/{1.73_m2} — ABNORMAL LOW (ref >59)
GFR calc non Af Amer: 49 mL/min/{1.73_m2} — ABNORMAL LOW (ref >59)
GLUCOSE: 104 mg/dL — AB (ref 65–99)
POTASSIUM: 4.5 mmol/L (ref 3.5–5.2)
SODIUM: 143 mmol/L (ref 134–144)

## 2017-06-22 LAB — HEPATIC FUNCTION PANEL
ALBUMIN: 4.6 g/dL (ref 3.5–4.7)
ALT: 29 IU/L (ref 0–32)
AST: 24 IU/L (ref 0–40)
Alkaline Phosphatase: 77 IU/L (ref 39–117)
BILIRUBIN TOTAL: 0.4 mg/dL (ref 0.0–1.2)
Bilirubin, Direct: 0.11 mg/dL (ref 0.00–0.40)
TOTAL PROTEIN: 6.7 g/dL (ref 6.0–8.5)

## 2017-06-22 LAB — HEMOGLOBIN A1C
ESTIMATED AVERAGE GLUCOSE: 123 mg/dL
HEMOGLOBIN A1C: 5.9 % — AB (ref 4.8–5.6)

## 2017-06-23 ENCOUNTER — Telehealth: Payer: Self-pay | Admitting: Pulmonary Disease

## 2017-06-23 NOTE — Telephone Encounter (Signed)
Called and spoke with Wells Guiles from [place]. Patient was walked a few weeks ago and completed two laps with only dropping to 95. She has asked that we walk the patient on a qualifying 6 minute walk so they can see that the patient drops and needs her device. Patient has been scheduled. Patient is aware. Nothing further needed.

## 2017-06-24 ENCOUNTER — Ambulatory Visit (INDEPENDENT_AMBULATORY_CARE_PROVIDER_SITE_OTHER): Payer: Medicare Other | Admitting: *Deleted

## 2017-06-24 ENCOUNTER — Telehealth: Payer: Self-pay | Admitting: Pulmonary Disease

## 2017-06-24 DIAGNOSIS — J9611 Chronic respiratory failure with hypoxia: Secondary | ICD-10-CM | POA: Diagnosis not present

## 2017-06-24 NOTE — Progress Notes (Signed)
Ambulatory Pulse Oximetry   Row Name  06/24/17 0953          Resting  Supplemental oxygen during test?  No          Resting Heart Rate  88          Resting Sp02  96          Lap 1 (185 feet)  HR  106          02 Sat  95          Lap 2 (185 feet)  HR  108          02 Sat  92          Lap 3 (185 feet)  HR  106          02 Sat  93          Tech Comments:  Patient was able to comple- te 3 laps. She had to stop several times due to feeling short winded. Her O2 never dropped below 92%. She did state that her chest felt tig- ht, but denied any chest pains or leg pain.

## 2017-06-24 NOTE — Telephone Encounter (Signed)
Lenn Sink, requesting results from today's 69min walk. Fax number [phone]

## 2017-06-24 NOTE — Telephone Encounter (Signed)
Patient came in this morning to do a qualifying walk for an Inogen Device. Below are her results.   Ambulatory Pulse Oximetry   Row Name  06/24/17 0953          Resting  Supplemental oxygen during test?  No          Resting Heart Rate  88          Resting Sp02  96          Lap 1 (185 feet)  HR  106          02 Sat  95          Lap 2 (185 feet)  HR  108          02 Sat  92          Lap 3 (185 feet)  HR  106          02 Sat  93          Tech Comments:  Patient was able to comple- te 3 laps. She had to stop several times due to feeling short winded. Her O2 never dropped below 92%. She did state that her chest felt tig- ht, but denied any chest pains or leg pain.              After the walk, she questioned whether or not she needed any oxygen. She stated that she has been on O2 since May 2018. She only needs the O2 when she is out shopping, walking up hills, or gardening.   BQ, please advise. Thanks!

## 2017-06-27 DIAGNOSIS — G629 Polyneuropathy, unspecified: Secondary | ICD-10-CM | POA: Diagnosis not present

## 2017-06-28 ENCOUNTER — Ambulatory Visit (INDEPENDENT_AMBULATORY_CARE_PROVIDER_SITE_OTHER): Payer: Medicare Other | Admitting: Family Medicine

## 2017-06-28 ENCOUNTER — Encounter: Payer: Self-pay | Admitting: Family Medicine

## 2017-06-28 VITALS — BP 138/82 | Ht 64.5 in | Wt 166.6 lb

## 2017-06-28 DIAGNOSIS — Z0001 Encounter for general adult medical examination with abnormal findings: Secondary | ICD-10-CM

## 2017-06-28 DIAGNOSIS — I739 Peripheral vascular disease, unspecified: Secondary | ICD-10-CM

## 2017-06-28 DIAGNOSIS — I10 Essential (primary) hypertension: Secondary | ICD-10-CM

## 2017-06-28 DIAGNOSIS — I779 Disorder of arteries and arterioles, unspecified: Secondary | ICD-10-CM

## 2017-06-28 DIAGNOSIS — Z0000 Encounter for general adult medical examination without abnormal findings: Secondary | ICD-10-CM

## 2017-06-28 NOTE — Progress Notes (Signed)
Subjective:    Patient ID: [name], female    DOB: 22-Jul-1928, 84 y.o.   MRN: [id number]  HPI AWV- Annual Wellness Visit  The patient was seen for their annual wellness visit. The patient's past medical history, surgical history, and family history were reviewed. Pertinent vaccines were reviewed ( tetanus, pneumonia, shingles, flu) The patient's medication list was reviewed and updated.  The height and weight were entered.  BMI recorded in electronic record elsewhere  Cognitive screening was completed. Outcome of Mini - Cog: pass   Falls /depression screening electronically recorded within record elsewhere  Current tobacco usage:no (All patients who use tobacco were given written and verbal information on quitting)  Recent listing of emergency department/hospitalizations over the past year were reviewed.  current specialist the patient sees on a regular basis: yes   Medicare annual wellness visit patient questionnaire was reviewed.  A written screening schedule for the patient for the next 5-10 years was given. Appropriate discussion of followup regarding next visit was discussed.  Pt would like provider to look at knot of left leg Patient history of carotid artery disease had a carotid ultrasound done within the past year does try to watch her diet  Lab work reviewed with the patient including elevated cholesterol slightly elevated sugar Results for orders placed or performed in visit on 06/16/17  HgB A1c  Result Value Ref Range   Hgb A1c MFr Bld 5.9 (H) 4.8 - 5.6 %   Est. average glucose Bld gHb Est-mCnc 123 mg/dL  Hepatic function panel  Result Value Ref Range   Total Protein 6.7 6.0 - 8.5 g/dL   Albumin 4.6 3.5 - 4.7 g/dL   Bilirubin Total 0.4 0.0 - 1.2 mg/dL   Bilirubin, Direct 0.11 0.00 - 0.40 mg/dL   Alkaline Phosphatase 77 39 - 117 IU/L   AST 24 0 - 40 IU/L   ALT 29 0 - 32 IU/L  Lipid Profile  Result Value Ref Range   Cholesterol, Total 225 (H)  100 - 199 mg/dL   Triglycerides 118 0 - 149 mg/dL   HDL 76 >39 mg/dL   VLDL Cholesterol Cal 24 5 - 40 mg/dL   LDL Calculated 125 (H) 0 - 99 mg/dL   Chol/HDL Ratio 3.0 0.0 - 4.4 ratio  Basic Metabolic Panel (BMET)  Result Value Ref Range   Glucose 104 (H) 65 - 99 mg/dL   BUN 22 8 - 27 mg/dL   Creatinine, Ser 1.04 (H) 0.57 - 1.00 mg/dL   GFR calc non Af Amer 49 (L) >59 mL/min/1.73   GFR calc Af Amer 56 (L) >59 mL/min/1.73   BUN/Creatinine Ratio 21 12 - 28   Sodium 143 134 - 144 mmol/L   Potassium 4.5 3.5 - 5.2 mmol/L   Chloride 104 96 - 106 mmol/L   CO2 25 20 - 29 mmol/L   Calcium 10.3 8.7 - 10.3 mg/dL     Patient being followed for her cancer has a CAT scan coming up first week in May  Patient for blood pressure check up. Patient relates compliance with meds. Todays BP reviewed with the patient. Patient denies issues with medication. Patient relates reasonable diet. Patient tries to minimize salt. Patient aware of BP goals.  Review of Systems  Constitutional: Negative for activity change, appetite change and fatigue.  HENT: Negative for congestion and rhinorrhea.   Eyes: Negative for discharge.  Respiratory: Negative for cough, chest tightness and wheezing.   Cardiovascular: Negative for chest pain.  Gastrointestinal: Negative for abdominal pain, blood in stool and vomiting.  Endocrine: Negative for polyphagia.  Genitourinary: Negative for difficulty urinating and frequency.  Musculoskeletal: Negative for neck pain.  Skin: Negative for color change.  Allergic/Immunologic: Negative for environmental allergies and food allergies.  Neurological: Negative for weakness and headaches.  Psychiatric/Behavioral: Negative for agitation and behavioral problems.       Objective:   Physical Exam  Constitutional: She is oriented to person, place, and time. She appears well-developed and well-nourished.  HENT:  Head: Normocephalic and atraumatic.  Right Ear: External ear normal.    Left Ear: External ear normal.  Eyes: Right eye exhibits no discharge. Left eye exhibits no discharge.  Neck: Normal range of motion. No tracheal deviation present.  Cardiovascular: Normal rate, regular rhythm, normal heart sounds and intact distal pulses. Exam reveals no gallop.  No murmur heard. Pulmonary/Chest: Effort normal and breath sounds normal. No stridor. No respiratory distress. She has no wheezes. She has no rales.  Abdominal: Soft. Bowel sounds are normal. She exhibits no distension and no mass. There is no tenderness. There is no rebound and no guarding.  Musculoskeletal: Normal range of motion. She exhibits no edema or tenderness.  Lymphadenopathy:    She has no cervical adenopathy.  Neurological: She is alert and oriented to person, place, and time. She exhibits normal muscle tone.  Skin: Skin is warm and dry.  Psychiatric: She has a normal mood and affect. Her behavior is normal.   Breast exam normal pelvic exam normal varicose vein noted on the lower leg no sign of blood clot       Assessment & Plan:  Blood pressure good control Adult wellness-complete.wellness physical was conducted today. Importance of diet and exercise were discussed in detail.  In addition to this a discussion regarding safety was also covered. We also reviewed over immunizations and gave recommendations regarding current immunization needed for age.  In addition to this additional areas were also touched on including: Preventative health exams needed: Colonoscopy not indicated  Patient was advised yearly wellness exam  Carotid Doppler studies in the fall lab work in the fall  Carotid disease not severe no surgery indicated

## 2017-06-29 NOTE — Telephone Encounter (Signed)
As she didn't qualify then I don't think she should pay out of pocket for the device.  The six minute walk results were conclusive: she can keep her oxygen level up on her own.  At some point this may change, but at least for now it doesn't look like she needs it.

## 2017-06-29 NOTE — Telephone Encounter (Signed)
Pt is calling about Inogen. Would like to speak to a nurse. Cb is [phone]

## 2017-06-29 NOTE — Telephone Encounter (Signed)
Called patient, advised her of BQ response. Nothing further needed.

## 2017-06-29 NOTE — Telephone Encounter (Signed)
Spoke with pt. She is still waiting to hear back from Dr. Lake Bells about whether or not he wants her to keep using the oxygen. States that if Dr. Lake Bells wants her to continue the oxygen, she will pay out of pocket for the Inogen system since she doesn't qualify for oxygen.  Dr. Lake Bells - please.

## 2017-07-06 DIAGNOSIS — M21061 Valgus deformity, not elsewhere classified, right knee: Secondary | ICD-10-CM | POA: Diagnosis not present

## 2017-07-06 DIAGNOSIS — M1711 Unilateral primary osteoarthritis, right knee: Secondary | ICD-10-CM | POA: Diagnosis not present

## 2017-07-06 DIAGNOSIS — M170 Bilateral primary osteoarthritis of knee: Secondary | ICD-10-CM | POA: Diagnosis not present

## 2017-07-06 DIAGNOSIS — M25561 Pain in right knee: Secondary | ICD-10-CM | POA: Diagnosis not present

## 2017-07-06 DIAGNOSIS — R269 Unspecified abnormalities of gait and mobility: Secondary | ICD-10-CM | POA: Diagnosis not present

## 2017-07-06 DIAGNOSIS — M25661 Stiffness of right knee, not elsewhere classified: Secondary | ICD-10-CM | POA: Diagnosis not present

## 2017-07-06 DIAGNOSIS — M25562 Pain in left knee: Secondary | ICD-10-CM | POA: Diagnosis not present

## 2017-07-12 DIAGNOSIS — M25561 Pain in right knee: Secondary | ICD-10-CM | POA: Diagnosis not present

## 2017-07-12 DIAGNOSIS — M1711 Unilateral primary osteoarthritis, right knee: Secondary | ICD-10-CM | POA: Diagnosis not present

## 2017-07-14 ENCOUNTER — Ambulatory Visit (HOSPITAL_COMMUNITY)
Admission: RE | Admit: 2017-07-14 | Discharge: 2017-07-14 | Disposition: A | Discharge status: Home / Self Care | Payer: Medicare Other | Source: Ambulatory Visit | Attending: Pulmonary Disease | Admitting: Pulmonary Disease

## 2017-07-14 DIAGNOSIS — R918 Other nonspecific abnormal finding of lung field: Secondary | ICD-10-CM | POA: Diagnosis not present

## 2017-07-14 DIAGNOSIS — R911 Solitary pulmonary nodule: Secondary | ICD-10-CM | POA: Insufficient documentation

## 2017-07-14 DIAGNOSIS — I700 Atherosclerosis of aorta: Secondary | ICD-10-CM | POA: Insufficient documentation

## 2017-07-14 DIAGNOSIS — J439 Emphysema, unspecified: Secondary | ICD-10-CM | POA: Insufficient documentation

## 2017-07-20 DIAGNOSIS — M25561 Pain in right knee: Secondary | ICD-10-CM | POA: Diagnosis not present

## 2017-07-20 DIAGNOSIS — M1711 Unilateral primary osteoarthritis, right knee: Secondary | ICD-10-CM | POA: Diagnosis not present

## 2017-07-27 DIAGNOSIS — M1711 Unilateral primary osteoarthritis, right knee: Secondary | ICD-10-CM | POA: Diagnosis not present

## 2017-07-27 DIAGNOSIS — M25561 Pain in right knee: Secondary | ICD-10-CM | POA: Diagnosis not present

## 2017-08-22 ENCOUNTER — Other Ambulatory Visit: Payer: Self-pay | Admitting: Family Medicine

## 2017-09-22 ENCOUNTER — Inpatient Hospital Stay (HOSPITAL_COMMUNITY): Payer: Medicare Other | Attending: Hematology

## 2017-09-22 DIAGNOSIS — I10 Essential (primary) hypertension: Secondary | ICD-10-CM | POA: Diagnosis not present

## 2017-09-22 DIAGNOSIS — Z7952 Long term (current) use of systemic steroids: Secondary | ICD-10-CM | POA: Diagnosis not present

## 2017-09-22 DIAGNOSIS — Z79899 Other long term (current) drug therapy: Secondary | ICD-10-CM | POA: Diagnosis not present

## 2017-09-22 DIAGNOSIS — M8580 Other specified disorders of bone density and structure, unspecified site: Secondary | ICD-10-CM | POA: Diagnosis not present

## 2017-09-22 DIAGNOSIS — R5383 Other fatigue: Secondary | ICD-10-CM | POA: Insufficient documentation

## 2017-09-22 DIAGNOSIS — Z800 Family history of malignant neoplasm of digestive organs: Secondary | ICD-10-CM | POA: Diagnosis not present

## 2017-09-22 DIAGNOSIS — Z880 Allergy status to penicillin: Secondary | ICD-10-CM | POA: Insufficient documentation

## 2017-09-22 DIAGNOSIS — E119 Type 2 diabetes mellitus without complications: Secondary | ICD-10-CM | POA: Insufficient documentation

## 2017-09-22 DIAGNOSIS — Z923 Personal history of irradiation: Secondary | ICD-10-CM | POA: Diagnosis not present

## 2017-09-22 DIAGNOSIS — Z87891 Personal history of nicotine dependence: Secondary | ICD-10-CM | POA: Insufficient documentation

## 2017-09-22 DIAGNOSIS — J449 Chronic obstructive pulmonary disease, unspecified: Secondary | ICD-10-CM | POA: Insufficient documentation

## 2017-09-22 DIAGNOSIS — C50411 Malignant neoplasm of upper-outer quadrant of right female breast: Secondary | ICD-10-CM | POA: Insufficient documentation

## 2017-09-22 DIAGNOSIS — Z79811 Long term (current) use of aromatase inhibitors: Secondary | ICD-10-CM | POA: Insufficient documentation

## 2017-09-22 DIAGNOSIS — Z170 Estrogen receptor positive status [ER+]: Secondary | ICD-10-CM | POA: Diagnosis not present

## 2017-09-22 DIAGNOSIS — Z90710 Acquired absence of both cervix and uterus: Secondary | ICD-10-CM | POA: Diagnosis not present

## 2017-09-22 DIAGNOSIS — E7800 Pure hypercholesterolemia, unspecified: Secondary | ICD-10-CM | POA: Diagnosis not present

## 2017-09-22 DIAGNOSIS — C50911 Malignant neoplasm of unspecified site of right female breast: Secondary | ICD-10-CM

## 2017-09-22 LAB — COMPREHENSIVE METABOLIC PANEL
ALBUMIN: 4.1 g/dL (ref 3.5–5.0)
ALT: 31 U/L (ref 0–44)
AST: 32 U/L (ref 15–41)
Alkaline Phosphatase: 63 U/L (ref 38–126)
Anion gap: 8 (ref 5–15)
BILIRUBIN TOTAL: 0.5 mg/dL (ref 0.3–1.2)
BUN: 26 mg/dL — AB (ref 8–23)
CHLORIDE: 105 mmol/L (ref 98–111)
CO2: 28 mmol/L (ref 22–32)
CREATININE: 1.08 mg/dL — AB (ref 0.44–1.00)
Calcium: 10.1 mg/dL (ref 8.9–10.3)
GFR calc Af Amer: 52 mL/min — ABNORMAL LOW (ref >60)
GFR, EST NON AFRICAN AMERICAN: 45 mL/min — AB (ref >60)
GLUCOSE: 120 mg/dL — AB (ref 70–99)
Potassium: 4.6 mmol/L (ref 3.5–5.1)
Sodium: 141 mmol/L (ref 135–145)
Total Protein: 6.9 g/dL (ref 6.5–8.1)

## 2017-09-22 LAB — CBC WITH DIFFERENTIAL/PLATELET
BASOS ABS: 0 10*3/uL (ref 0.0–0.1)
BASOS PCT: 1 %
Eosinophils Absolute: 0.1 10*3/uL (ref 0.0–0.7)
Eosinophils Relative: 2 %
HEMATOCRIT: 36.4 % (ref 36.0–46.0)
Hemoglobin: 11.6 g/dL — ABNORMAL LOW (ref 12.0–15.0)
LYMPHS PCT: 26 %
Lymphs Abs: 1.7 10*3/uL (ref 0.7–4.0)
MCH: 31 pg (ref 26.0–34.0)
MCHC: 31.9 g/dL (ref 30.0–36.0)
MCV: 97.3 fL (ref 78.0–100.0)
Monocytes Absolute: 0.5 10*3/uL (ref 0.1–1.0)
Monocytes Relative: 7 %
NEUTROS ABS: 4.2 10*3/uL (ref 1.7–7.7)
Neutrophils Relative %: 64 %
Platelets: 231 10*3/uL (ref 150–400)
RBC: 3.74 MIL/uL — AB (ref 3.87–5.11)
RDW: 12.9 % (ref 11.5–15.5)
WBC: 6.6 10*3/uL (ref 4.0–10.5)

## 2017-09-26 DIAGNOSIS — H40013 Open angle with borderline findings, low risk, bilateral: Secondary | ICD-10-CM | POA: Diagnosis not present

## 2017-09-26 DIAGNOSIS — H25812 Combined forms of age-related cataract, left eye: Secondary | ICD-10-CM | POA: Diagnosis not present

## 2017-09-26 DIAGNOSIS — H25811 Combined forms of age-related cataract, right eye: Secondary | ICD-10-CM | POA: Diagnosis not present

## 2017-09-26 DIAGNOSIS — H35373 Puckering of macula, bilateral: Secondary | ICD-10-CM | POA: Diagnosis not present

## 2017-09-28 DIAGNOSIS — C50411 Malignant neoplasm of upper-outer quadrant of right female breast: Secondary | ICD-10-CM | POA: Diagnosis not present

## 2017-09-28 DIAGNOSIS — Z170 Estrogen receptor positive status [ER+]: Secondary | ICD-10-CM | POA: Diagnosis not present

## 2017-09-29 ENCOUNTER — Encounter (HOSPITAL_COMMUNITY): Payer: Self-pay | Admitting: Hematology

## 2017-09-29 ENCOUNTER — Inpatient Hospital Stay (HOSPITAL_BASED_OUTPATIENT_CLINIC_OR_DEPARTMENT_OTHER): Payer: Medicare Other | Admitting: Hematology

## 2017-09-29 ENCOUNTER — Other Ambulatory Visit: Payer: Self-pay

## 2017-09-29 VITALS — BP 144/59 | HR 89 | Temp 98.3°F | Resp 18 | Wt 162.0 lb

## 2017-09-29 DIAGNOSIS — E7800 Pure hypercholesterolemia, unspecified: Secondary | ICD-10-CM

## 2017-09-29 DIAGNOSIS — Z7952 Long term (current) use of systemic steroids: Secondary | ICD-10-CM | POA: Diagnosis not present

## 2017-09-29 DIAGNOSIS — C50411 Malignant neoplasm of upper-outer quadrant of right female breast: Secondary | ICD-10-CM | POA: Diagnosis not present

## 2017-09-29 DIAGNOSIS — Z79811 Long term (current) use of aromatase inhibitors: Secondary | ICD-10-CM | POA: Diagnosis not present

## 2017-09-29 DIAGNOSIS — Z90710 Acquired absence of both cervix and uterus: Secondary | ICD-10-CM

## 2017-09-29 DIAGNOSIS — E119 Type 2 diabetes mellitus without complications: Secondary | ICD-10-CM

## 2017-09-29 DIAGNOSIS — R5383 Other fatigue: Secondary | ICD-10-CM

## 2017-09-29 DIAGNOSIS — Z170 Estrogen receptor positive status [ER+]: Secondary | ICD-10-CM | POA: Diagnosis not present

## 2017-09-29 DIAGNOSIS — Z79899 Other long term (current) drug therapy: Secondary | ICD-10-CM

## 2017-09-29 DIAGNOSIS — M8580 Other specified disorders of bone density and structure, unspecified site: Secondary | ICD-10-CM | POA: Diagnosis not present

## 2017-09-29 DIAGNOSIS — C50911 Malignant neoplasm of unspecified site of right female breast: Secondary | ICD-10-CM

## 2017-09-29 DIAGNOSIS — J449 Chronic obstructive pulmonary disease, unspecified: Secondary | ICD-10-CM

## 2017-09-29 DIAGNOSIS — I10 Essential (primary) hypertension: Secondary | ICD-10-CM

## 2017-09-29 DIAGNOSIS — Z880 Allergy status to penicillin: Secondary | ICD-10-CM

## 2017-09-29 DIAGNOSIS — Z87891 Personal history of nicotine dependence: Secondary | ICD-10-CM

## 2017-09-29 DIAGNOSIS — Z923 Personal history of irradiation: Secondary | ICD-10-CM

## 2017-09-29 DIAGNOSIS — Z800 Family history of malignant neoplasm of digestive organs: Secondary | ICD-10-CM

## 2017-09-29 NOTE — Patient Instructions (Signed)
[facility] Discharge Instructions  Today you saw Dr. K.   Thank you for choosing [facility] at [facility] to provide your oncology and hematology care.  To afford each patient quality time with our provider, please arrive at least 15 minutes before your scheduled appointment time.   If you have a lab appointment with the Cancer Center please come in thru the  Main Entrance and check in at the main information desk  You need to re-schedule your appointment should you arrive 10 or more minutes late.  We strive to give you quality time with our providers, and arriving late affects you and other patients whose appointments are after yours.  Also, if you no show three or more times for appointments you may be dismissed from the clinic at the providers discretion.     Again, thank you for choosing [facility].  Our hope is that these requests will decrease the amount of time that you wait before being seen by our physicians.       _____________________________________________________________  Should you have questions after your visit to [facility], please contact our office at [phone] between the hours of 8:30 a.m. and 4:30 p.m.  Voicemails left after 4:30 p.m. will not be returned until the following business day.  For prescription refill requests, have your pharmacy contact our office.       Resources For Cancer Patients and their Caregivers ? American Cancer Society: Can assist with transportation, wigs, general needs, runs Look Good Feel Better.        1-888-227-6333 ? Cancer Care: Provides financial assistance, online support groups, medication/co-pay assistance.  1-800-813-HOPE (4673) ? [facility] Assists [place] cancer patients and their families through emotional , educational and financial support.  336-427-4357 ? [place] DSS Where to apply for food  stamps, Medicaid and utility assistance. 336-342-1394 ? [facility]: Transportation to medical appointments. 336-347-2287 ? Social Security Administration: May apply for disability if have a Stage IV cancer. 336-342-7796 1-800-772-1213 ? [place] Aging, Disability and Transit Services: Assists with nutrition, care and transit needs. 336-349-2343  Cancer Center Support Programs:   > Cancer Support Group  2nd Tuesday of the month 1pm-2pm, Journey Room   > Creative Journey  3rd Tuesday of the month 1130am-1pm, Journey Room    

## 2017-09-29 NOTE — Assessment & Plan Note (Addendum)
1.  Stage I right breast cancer: - Right breast lumpectomy on 05/23/2015, 0.7 cm IDC, grade 1, associated with low-grade DCIS, margins negative, ER positive, PR negative, HER-2 negative, Ki-67 of 10% -Arimidex started in June 2017, tolerating well.  She has one hot flash per day.  She takes Arimidex at night. - Examination shows slight tenderness at the right lumpectomy site, otherwise no palpable masses.  I have reviewed mammogram dated 06/07/2017 which was BI-RADS Category 2.  We will see her back in 6 months for follow-up.  2.  Osteopenia: -Last DEXA scan on 08/02/2016 shows T score of -1.3. -She had been treated with Fosamax over 10 years and discontinued last year.  She will continue calcium and vitamin D.  I plan to repeat DEXA in May 2020.  3.  Lung nodule: -CT scan of the chest on 07/14/2017 shows 13 mm stable nonsolid right lower lobe irregular lesion.  10 mm right upper lobe lesion is also unchanged.

## 2017-09-29 NOTE — Progress Notes (Signed)
[name],  [id number]   CLINIC:  Medical Oncology/Hematology  PCP:  Lauren Drown, MD [address] [place] [address] [phone]   REASON FOR VISIT:  Follow-up for stage I invasive ductal carcinoma of the right breast ER+/PR-/HER2-  CURRENT THERAPY: Arimidex PO daily since 08/2015  BRIEF ONCOLOGIC HISTORY:    Breast cancer, right breast ([place])   04/22/2015 Procedure    Right breast needle core biopsy      04/24/2015 Pathology Results    Invasive ductal carcinoma, grade 1.  ER100%, PR NEGATIVE, Ki-67 10%, HER2 NEGATIVE      05/23/2015 Procedure    Right breast lumpectomy by Dr. Dalbert Batman      05/27/2015 Pathology Results    Breast, lumpectomy, Right - INVASIVE GRADE I DUCTAL CARCINOMA SPANNING 0.7 CM IN GREATEST LINEAR DIMENSION. - ASSOCIATED LOW GRADE DUCTAL CARCINOMA IN SITU. - TUMOR SHOWS ASSOCIATED CALCIFICATIONS. - LOBULAR CARCINOMA IN SITU WITH ASSOCIATED CALCIFICATIONS. - MARGINS ARE NEGATIVE.       - 07/23/2015 Radiation Therapy    Dr. Pablo Ledger      08/01/2015 Imaging    Bone density- BMD as determined from Femur Neck Left is 0.891 g/cm2 with a T-Score of -1.1. This patient is considered osteopenic according to [facility]) criteria.      08/14/2015 -  Anti-estrogen oral therapy    Arimidex      08/02/2016 Imaging    Bone density- This patient is considered osteopenic according to [facility]) criteria.         CANCER STAGING: Cancer Staging Breast cancer, right breast ([place]) Staging form: Breast, AJCC 8th Edition - Pathologic stage from 05/27/2015: Stage IB (pT1b, pN0, cM0, G1, ER: Positive, PR: Negative, HER2: Negative) - Signed by B[facility], PA-C on 03/25/2016    INTERVAL HISTORY:  [name] 84 y.o. female returns for routine follow-up right invasive ductal carcinoma stage 1. Patient has been taking her Arimidex as prescribed. She is having  hot flashes with the medication. She takes her medication a night so she wont have the hot flashes during the day. Patient doing well with the medication no other issues. Patient energy levels are at 50% and her appetite remains good at 100%. Denies any new pains. Denies any new lumps or bumps. Denies any fevers. She will need another DEXA scan next may.     REVIEW OF SYSTEMS:  Review of Systems  Constitutional: Positive for fatigue.  All other systems reviewed and are negative.    PAST MEDICAL/SURGICAL HISTORY:  Past Medical History:  Diagnosis Date  . Breast cancer ([place])   . Breast cancer, right breast ([place]) 05/01/2015  . Cancer ([facility])    BREAST CANCER   . Colon polyps   . COPD (chronic obstructive pulmonary disease) ([place])    DR. Lake Bells  . Diabetes mellitus without complication ([place])    PREDIABETIC  CONTROL W/ DIET  . Hypercholesteremia   . Hypertension   . Lung nodules   . Osteoarthritis   . Osteopenia 09/18/2013  . Osteoporosis   . Personal history of radiation therapy   . PONV (postoperative nausea and vomiting)   . Shortness of breath dyspnea    W/ EXERTION   Past Surgical History:  Procedure Laterality Date  . ABDOMINAL HYSTERECTOMY    . APPENDECTOMY    . BARTHOLIN GLAND CYST EXCISION    . BREAST EXCISIONAL BIOPSY Left    benign  .  BREAST LUMPECTOMY WITH RADIOACTIVE SEED LOCALIZATION Right 05/23/2015   Procedure: RIGHT BREAST LUMPECTOMY WITH RADIOACTIVE SEED LOCALIZATION;  Surgeon: Fanny Skates, MD;  Location: [facility];  Service: General;  Laterality: Right;  . BREAST SURGERY     2000  LEFT BREAST  LUMP REMOVED  . colonoscopy    . COLONOSCOPY  03/25/2011   Procedure: COLONOSCOPY;  Surgeon: Rogene Houston, MD;  [facility];  Service: Endoscopy;  Laterality: N/A;  9:30 am  . left inguinal hernia repair    . multiple toe surgeries    . TONSILLECTOMY       SOCIAL HISTORY:  Social History   Socioeconomic History  . Marital status: Widowed    Spouse  name: Not on file  . Number of children: Not on file  . Years of education: Not on file  . Highest education level: Not on file  Occupational History  . Not on file  Social Needs  . Financial resource strain: Not on file  . Food insecurity:    Worry: Not on file    Inability: Not on file  . Transportation needs:    Medical: Not on file    Non-medical: Not on file  Tobacco Use  . Smoking status: Former Smoker    Packs/day: 2.00    Years: 50.00    Pack years: 100.00    Types: Cigarettes    Last attempt to quit: 03/15/1988    Years since quitting: 29.5  . Smokeless tobacco: Never Used  Substance and Sexual Activity  . Alcohol use: No    Alcohol/week: 0.0 oz  . Drug use: No  . Sexual activity: Not on file  Lifestyle  . Physical activity:    Days per week: Not on file    Minutes per session: Not on file  . Stress: Not on file  Relationships  . Social connections:    Talks on phone: Not on file    Gets together: Not on file    Attends religious service: Not on file    Active member of club or organization: Not on file    Attends meetings of clubs or organizations: Not on file    Relationship status: Not on file  . Intimate partner violence:    Fear of current or ex partner: Not on file    Emotionally abused: Not on file    Physically abused: Not on file    Forced sexual activity: Not on file  Other Topics Concern  . Not on file  Social History Narrative  . Not on file    FAMILY HISTORY:  Family History  Problem Relation Age of Onset  . Colon cancer Father     CURRENT MEDICATIONS:  Outpatient Encounter Medications as of 09/29/2017  Medication Sig  . Albuterol Sulfate (PROAIR RESPICLICK) 423 (90 Base) MCG/ACT AEPB Inhale 1 puff into the lungs every 4 (four) hours as needed (Wheezing, chest tightness).  Marland Kitchen anastrozole (ARIMIDEX) 1 MG tablet TAKE ONE TABLET BY MOUTH ONCE DAILY.  Marland Kitchen anastrozole (ARIMIDEX) 1 MG tablet TAKE ONE TABLET BY MOUTH ONCE DAILY.  . Bilberry,  Vaccinium myrtillus, (BILBERRY PO) Take 1 Dose by mouth daily.   . fluticasone (FLONASE) 50 MCG/ACT nasal spray Place 2 sprays into both nostrils daily. Sometimes uses a second dose qhs  . furosemide (LASIX) 20 MG tablet TAKE ONE TABLET BY MOUTH EVERY MORNING.  Marland Kitchen lisinopril (PRINIVIL,ZESTRIL) 20 MG tablet TAKE (1) TABLET BY MOUTH ONCE DAILY.  Marland Kitchen lisinopril (PRINIVIL,ZESTRIL) 20 MG  tablet TAKE (1) TABLET BY MOUTH ONCE DAILY.  . Multiple Vitamins-Minerals (CENTRUM SILVER ADULT 50+ PO) Take by mouth.  . nortriptyline (PAMELOR) 10 MG capsule Take 2-3 at night  . polyethylene glycol (MIRALAX / GLYCOLAX) packet Take 17 g by mouth daily as needed. Constipation  . predniSONE (DELTASONE) 10 MG tablet Take 4 tabs x 2 days, then 2 tabs x 2 days, then 1 tab x 2 days, then stop.  Marland Kitchen STIOLTO RESPIMAT 2.5-2.5 MCG/ACT AERS INHALE 2 PUFFS INTO THE LUNGS ONCE DAILY.   No facility-administered encounter medications on file as of 09/29/2017.     ALLERGIES:  Allergies  Allergen Reactions  . Augmentin [Amoxicillin-Pot Clavulanate] Nausea And Vomiting  . Amoxicillin Diarrhea  . Codeine Nausea And Vomiting  . Morphine Nausea And Vomiting  . Neomycin-Bacitracin Zn-Polymyx Itching and Rash    Unable to use Neosporin     PHYSICAL EXAM:  ECOG Performance status: 1  Vitals:   09/29/17 1443  BP: (!) 144/59  Pulse: 89  Resp: 18  Temp: 98.3 F (36.8 C)  SpO2: 96%   Filed Weights   09/29/17 1443  Weight: 162 lb (73.5 kg)    Physical Exam  Constitutional: She is oriented to person, place, and time.  Neurological: She is alert and oriented to person, place, and time.  Skin: Skin is warm and dry.  Right breast lumpectomy site in the upper outer quadrant is slightly tender to palpation but no palpable masses.  No palpable axillary adenopathy.  Left breast medial upper quadrant has keloids from previous surgery.  No palpable mass in the left breast. Abdomen: No palpable hepatospleno megaly. Extremities: No  edema or cyanosis.   LABORATORY DATA:  I have reviewed the labs as listed.  CBC    Component Value Date/Time   WBC 6.6 09/22/2017 1219   RBC 3.74 (L) 09/22/2017 1219   HGB 11.6 (L) 09/22/2017 1219   HGB 13.0 11/22/2016 1509   HCT 36.4 09/22/2017 1219   HCT 39.2 11/22/2016 1509   PLT 231 09/22/2017 1219   PLT 277 11/22/2016 1509   MCV 97.3 09/22/2017 1219   MCV 90 11/22/2016 1509   MCH 31.0 09/22/2017 1219   MCHC 31.9 09/22/2017 1219   RDW 12.9 09/22/2017 1219   RDW 14.0 11/22/2016 1509   LYMPHSABS 1.7 09/22/2017 1219   LYMPHSABS 1.7 11/22/2016 1509   MONOABS 0.5 09/22/2017 1219   EOSABS 0.1 09/22/2017 1219   EOSABS 0.2 11/22/2016 1509   BASOSABS 0.0 09/22/2017 1219   BASOSABS 0.0 11/22/2016 1509   CMP Latest Ref Rng & Units 09/22/2017 06/21/2017 03/25/2017  Glucose 70 - 99 mg/dL 120(H) 104(H) 88  BUN 8 - 23 mg/dL 26(H) 22 27(H)  Creatinine 0.44 - 1.00 mg/dL 1.08(H) 1.04(H) 1.13(H)  Sodium 135 - 145 mmol/L 141 143 137  Potassium 3.5 - 5.1 mmol/L 4.6 4.5 4.1  Chloride 98 - 111 mmol/L 105 104 100(L)  CO2 22 - 32 mmol/L '28 25 25  '$ Calcium 8.9 - 10.3 mg/dL 10.1 10.3 9.9  Total Protein 6.5 - 8.1 g/dL 6.9 6.7 7.0  Total Bilirubin 0.3 - 1.2 mg/dL 0.5 0.4 0.4  Alkaline Phos 38 - 126 U/L 63 77 70  AST 15 - 41 U/L 32 24 36  ALT 0 - 44 U/L 31 29 37       DIAGNOSTIC IMAGING:  I have personally reviewed mammogram dated 06/07/2017 and discussed results with the patient.     ASSESSMENT & PLAN:   Breast cancer, right  breast ([place]) 1.  Stage I right breast cancer: - Right breast lumpectomy on 05/23/2015, 0.7 cm IDC, grade 1, associated with low-grade DCIS, margins negative, ER positive, PR negative, HER-2 negative, Ki-67 of 10% -Arimidex started in June 2017, tolerating well.  She has one hot flash per day.  She takes Arimidex at night. - Examination shows slight tenderness at the right lumpectomy site, otherwise no palpable masses.  I have reviewed mammogram dated 06/07/2017 which  was BI-RADS Category 2.  We will see her back in 6 months for follow-up.  2.  Osteopenia: -Last DEXA scan on 08/02/2016 shows T score of -1.3. -She had been treated with Fosamax over 10 years and discontinued last year.  She will continue calcium and vitamin D.  I plan to repeat DEXA in May 2020.  3.  Lung nodule: -CT scan of the chest on 07/14/2017 shows 13 mm stable nonsolid right lower lobe irregular lesion.  10 mm right upper lobe lesion is also unchanged.      Orders placed this encounter:  Orders Placed This Encounter  Procedures  . CBC with Differential/Platelet  . Comprehensive metabolic panel      Derek Jack, MD [place] [phone]

## 2017-10-26 DIAGNOSIS — H25811 Combined forms of age-related cataract, right eye: Secondary | ICD-10-CM | POA: Diagnosis not present

## 2017-10-26 DIAGNOSIS — H2511 Age-related nuclear cataract, right eye: Secondary | ICD-10-CM | POA: Diagnosis not present

## 2017-10-26 DIAGNOSIS — H2181 Floppy iris syndrome: Secondary | ICD-10-CM | POA: Diagnosis not present

## 2017-11-04 ENCOUNTER — Other Ambulatory Visit (HOSPITAL_COMMUNITY): Payer: Self-pay | Admitting: *Deleted

## 2017-11-04 MED ORDER — ANASTROZOLE 1 MG PO TABS: 1.0000 mg | ORAL_TABLET | Freq: Every day | ORAL | 3 refills | Status: AC

## 2017-11-08 DIAGNOSIS — M170 Bilateral primary osteoarthritis of knee: Secondary | ICD-10-CM | POA: Diagnosis not present

## 2017-11-08 DIAGNOSIS — M1711 Unilateral primary osteoarthritis, right knee: Secondary | ICD-10-CM | POA: Diagnosis not present

## 2017-11-08 DIAGNOSIS — M25561 Pain in right knee: Secondary | ICD-10-CM | POA: Diagnosis not present

## 2017-11-15 DIAGNOSIS — M25561 Pain in right knee: Secondary | ICD-10-CM | POA: Diagnosis not present

## 2017-11-15 DIAGNOSIS — M1711 Unilateral primary osteoarthritis, right knee: Secondary | ICD-10-CM | POA: Diagnosis not present

## 2017-11-21 DIAGNOSIS — H25812 Combined forms of age-related cataract, left eye: Secondary | ICD-10-CM | POA: Diagnosis not present

## 2017-11-22 ENCOUNTER — Other Ambulatory Visit: Payer: Self-pay | Admitting: Family Medicine

## 2017-11-22 DIAGNOSIS — M1711 Unilateral primary osteoarthritis, right knee: Secondary | ICD-10-CM | POA: Diagnosis not present

## 2017-11-22 DIAGNOSIS — M25561 Pain in right knee: Secondary | ICD-10-CM | POA: Diagnosis not present

## 2017-11-23 DIAGNOSIS — H2189 Other specified disorders of iris and ciliary body: Secondary | ICD-10-CM | POA: Diagnosis not present

## 2017-11-23 DIAGNOSIS — H25812 Combined forms of age-related cataract, left eye: Secondary | ICD-10-CM | POA: Diagnosis not present

## 2017-11-24 DIAGNOSIS — H2702 Aphakia, left eye: Secondary | ICD-10-CM | POA: Diagnosis not present

## 2017-11-24 DIAGNOSIS — Z961 Presence of intraocular lens: Secondary | ICD-10-CM | POA: Diagnosis not present

## 2017-11-24 DIAGNOSIS — H43312 Vitreous membranes and strands, left eye: Secondary | ICD-10-CM | POA: Diagnosis not present

## 2017-11-24 DIAGNOSIS — H26491 Other secondary cataract, right eye: Secondary | ICD-10-CM | POA: Diagnosis not present

## 2017-11-29 DIAGNOSIS — H2702 Aphakia, left eye: Secondary | ICD-10-CM | POA: Diagnosis not present

## 2017-11-29 DIAGNOSIS — H43312 Vitreous membranes and strands, left eye: Secondary | ICD-10-CM | POA: Diagnosis not present

## 2017-12-01 ENCOUNTER — Ambulatory Visit: Payer: Medicare Other | Admitting: Pulmonary Disease

## 2017-12-05 DIAGNOSIS — M1711 Unilateral primary osteoarthritis, right knee: Secondary | ICD-10-CM | POA: Diagnosis not present

## 2017-12-05 DIAGNOSIS — M25561 Pain in right knee: Secondary | ICD-10-CM | POA: Diagnosis not present

## 2017-12-13 ENCOUNTER — Ambulatory Visit (INDEPENDENT_AMBULATORY_CARE_PROVIDER_SITE_OTHER): Payer: Medicare Other | Admitting: *Deleted

## 2017-12-13 DIAGNOSIS — Z23 Encounter for immunization: Secondary | ICD-10-CM

## 2017-12-14 ENCOUNTER — Ambulatory Visit (INDEPENDENT_AMBULATORY_CARE_PROVIDER_SITE_OTHER): Payer: Medicare Other | Admitting: Pulmonary Disease

## 2017-12-14 ENCOUNTER — Encounter: Payer: Self-pay | Admitting: Pulmonary Disease

## 2017-12-14 VITALS — BP 134/56 | HR 83 | Ht 62.5 in | Wt 162.6 lb

## 2017-12-14 DIAGNOSIS — J431 Panlobular emphysema: Secondary | ICD-10-CM | POA: Diagnosis not present

## 2017-12-14 DIAGNOSIS — J449 Chronic obstructive pulmonary disease, unspecified: Secondary | ICD-10-CM

## 2017-12-14 DIAGNOSIS — R911 Solitary pulmonary nodule: Secondary | ICD-10-CM | POA: Diagnosis not present

## 2017-12-14 DIAGNOSIS — J9611 Chronic respiratory failure with hypoxia: Secondary | ICD-10-CM | POA: Diagnosis not present

## 2017-12-14 NOTE — Progress Notes (Signed)
Subjective:    Patient ID: [name], female    DOB: 1928-06-10, 84 y.o.   MRN: [id number]  Synopsis  COPD, history of breast cancer; Referred in 2016 for chronic cough. Cough had resolved by the time she saw me. She been followed by [facility] for chronic cough for many years prior felt to be related to sinus congestion. She also had pulmonary nodules and some pleural calcification which was stable on chest imaging through 2012 there. After I saw her we did simple spirometry testing which showed clear airflow obstruction with an FEV1 of 48% predicted. However, the FVC was low at 1.38 L (54% predicted).  She smoked 2 packs a day for many years.  She was diagnosed with breast cancer in March 2017 and treated with lumpectomy, radiation therapy, and Arimidex  HPI Chief Complaint  Patient presents with  . Follow-up    Gets very SOB with activity.  Walking into our building today had to stop and rest several times.   [name] says that she thinks that she is better compared to prior.  However walking just 150 feet or so in our parking lot today.  She really hasnt been exercising much at home.  She says taht she gets very short of breath with exertion and this makes her feel worse so she stops exercising.  When she walks with the shopping cart she feels better.   Past Medical History:  Diagnosis Date  . Breast cancer ([place])   . Breast cancer, right breast ([place]) 05/01/2015  . Cancer (HCC)    BREAST CANCER   . Colon polyps   . COPD (chronic obstructive pulmonary disease) ([place])    DR. [place] [name]  . Diabetes mellitus without complication ([place])    PREDIABETIC  CONTROL W/ DIET  . Hypercholesteremia   . Hypertension   . Lung nodules   . Osteoarthritis   . Osteopenia 09/18/2013  . Osteoporosis   . Personal history of radiation therapy   . PONV (postoperative nausea and vomiting)   . Shortness of breath dyspnea    W/ EXERTION      Review of Systems  Constitutional: Negative for  chills, fatigue and fever.  HENT: Negative for rhinorrhea, sinus pressure and sinus pain.   Respiratory: Positive for shortness of breath. Negative for choking and wheezing.   Cardiovascular: Negative for chest pain, palpitations and leg swelling.       Objective:   Physical Exam Vitals:   12/14/17 1008  BP: (!) 134/56  Pulse: 83  SpO2: 99%  Weight: 162 lb 9.6 oz (73.8 kg)  Height: 5' 2.5" (1.588 m)    RA  Gen: well appearing HENT: OP clear, TM's clear, neck supple PULM: Crackles R base, no wheezing bilaterally, normal percussion CV: RRR, no mgr, trace edema GI: BS+, soft, nontender Derm: no cyanosis or rash Psyche: normal mood and affect    CBC    Component Value Date/Time   WBC 6.6 09/22/2017 1219   RBC 3.74 (L) 09/22/2017 1219   HGB 11.6 (L) 09/22/2017 1219   HGB 13.0 11/22/2016 1509   HCT 36.4 09/22/2017 1219   HCT 39.2 11/22/2016 1509   PLT 231 09/22/2017 1219   PLT 277 11/22/2016 1509   MCV 97.3 09/22/2017 1219   MCV 90 11/22/2016 1509   MCH 31.0 09/22/2017 1219   MCHC 31.9 09/22/2017 1219   RDW 12.9 09/22/2017 1219   RDW 14.0 11/22/2016 1509   LYMPHSABS 1.7 09/22/2017 1219   LYMPHSABS 1.7  11/22/2016 1509   MONOABS 0.5 09/22/2017 1219   EOSABS 0.1 09/22/2017 1219   EOSABS 0.2 11/22/2016 1509   BASOSABS 0.0 09/22/2017 1219   BASOSABS 0.0 11/22/2016 1509   PFT: 2016 spirometry: Clear airflow obstruction,Ratio 63%  FEV1 0.88 L, 48% predicted  Chest x-ray from the last visit reviewed showing a right upper lobe nodule versus area of scarring, does not seem to have changed significantly on today's chest x-ray  Chest imaging PET 07/2016 nodule not hypermetabolic 94/1740 Chest CT: RLL nodule 26mm, stable; RUL nodule 18mm, stable; rec 6-12 month f/u for the RUL nodule, q2 year follow up for 5 years for the RLL nodule 07/2017 CT  Chest images reviewed personally: 19mm RLL (ground glass) nodule stable, 22mm RUL (solid with calcium) nodule stable, emphysema      Assessment & Plan:   Chronic respiratory failure with hypoxia (HCC)  Solitary pulmonary nodule - Plan: CT Chest Wo Contrast  Panlobular emphysema (HCC)  Chronic obstructive pulmonary disease, unspecified COPD type ([place])  Discussion: [name] returns to clinic today complaining of some worsening shortness of breath which I think is likely due to the fact that she has not been able to exert herself very much lately specifically with her eye surgery for the last 7 weeks she has not been able to do much exercise so she is feeling a bit worse.  She has not had any exacerbations since the last visit which is good.  She has been using and benefiting from her portable oxygen.  Ideally I would like for her to exercise more but this is difficult while she is waiting for her retinal injury to heal.  Her pulmonary nodule showed no evidence of growth that the groundglass nodule needs to be followed closely as it is 1.3 cm in size.  I think the next best interval CT scan should be May 2020.  Plan: Pulmonary nodules: The 1 cm nodule in the right upper lobe appears benign to me and does not need further imaging However, the 1.3 cm nodule in the right lower lobe needs to be followed closely, the next study will be in May 2020 I am a if it stable at that point then we can check it on a 2-year basis  Severe COPD: Ideally I would like for you to exercise more, please ask the ophthalmologist when it is okay to do this Continue Stiolto daily Continue albuterol as needed for chest tightness wheezing or shortness of breath I am glad you have already had a flu shot Practice good hand hygiene  Chronic respiratory failure with hypoxemia: Keep using 2 L of oxygen with exertion  We will see you back in May 2020 or sooner if needed  > 50% of this 25 minute visit spent face to face    Current Outpatient Medications:  .  Albuterol Sulfate (PROAIR RESPICLICK) 814 (90 Base) MCG/ACT AEPB, Inhale 1 puff into  the lungs every 4 (four) hours as needed (Wheezing, chest tightness)., Disp: 1 each, Rfl: 5 .  anastrozole (ARIMIDEX) 1 MG tablet, Take 1 tablet (1 mg total) by mouth daily., Disp: 90 tablet, Rfl: 3 .  Bilberry, Vaccinium myrtillus, (BILBERRY PO), Take 1 Dose by mouth daily. , Disp: , Rfl:  .  fluticasone (FLONASE) 50 MCG/ACT nasal spray, Place 2 sprays into both nostrils daily. Sometimes uses a second dose qhs, Disp: , Rfl:  .  furosemide (LASIX) 20 MG tablet, TAKE ONE TABLET BY MOUTH EVERY MORNING., Disp: 90 tablet, Rfl: 0 .  lisinopril (PRINIVIL,ZESTRIL) 20 MG tablet, TAKE (1) TABLET BY MOUTH ONCE DAILY., Disp: 90 tablet, Rfl: 0 .  Multiple Vitamins-Minerals (CENTRUM SILVER ADULT 50+ PO), Take by mouth., Disp: , Rfl:  .  nortriptyline (PAMELOR) 10 MG capsule, Take 2-3 at night, Disp: , Rfl:  .  polyethylene glycol (MIRALAX / GLYCOLAX) packet, Take 17 g by mouth daily as needed. Constipation, Disp: , Rfl:  .  STIOLTO RESPIMAT 2.5-2.5 MCG/ACT AERS, INHALE 2 PUFFS INTO THE LUNGS ONCE DAILY., Disp: 4 g, Rfl: 5 .  predniSONE (DELTASONE) 10 MG tablet, Take 4 tabs x 2 days, then 2 tabs x 2 days, then 1 tab x 2 days, then stop. (Patient not taking: Reported on 12/14/2017), Disp: 14 tablet, Rfl: 0

## 2017-12-14 NOTE — Patient Instructions (Signed)
Pulmonary nodules: The 1 cm nodule in the right upper lobe appears benign to me and does not need further imaging However, the 1.3 cm nodule in the right lower lobe needs to be followed closely, the next study will be in May 2020 I am a if it stable at that point then we can check it on a 2-year basis  Severe COPD: Ideally I would like for you to exercise more, please ask the ophthalmologist when it is okay to do this Continue Stiolto daily Continue albuterol as needed for chest tightness wheezing or shortness of breath I am glad you have already had a flu shot Practice good hand hygiene  Chronic respiratory failure with hypoxemia: Keep using 2 L of oxygen with exertion  We will see you back in May 2020 or sooner if needed

## 2017-12-15 ENCOUNTER — Telehealth: Payer: Self-pay | Admitting: Family Medicine

## 2017-12-15 DIAGNOSIS — E785 Hyperlipidemia, unspecified: Secondary | ICD-10-CM

## 2017-12-15 DIAGNOSIS — R7303 Prediabetes: Secondary | ICD-10-CM

## 2017-12-15 DIAGNOSIS — D649 Anemia, unspecified: Secondary | ICD-10-CM

## 2017-12-15 NOTE — Telephone Encounter (Signed)
Pt would like to have her labs ordered so she may get those done before her follow up with Dr. Nicki Reaper on 12/26/17

## 2017-12-15 NOTE — Telephone Encounter (Signed)
Last labs 06/2017: Lipid, Liver, Met 7 and HgbA1c

## 2017-12-16 NOTE — Telephone Encounter (Signed)
CBC, lipid, A1c-prediabetes hyperlipidemia other anemia

## 2017-12-16 NOTE — Telephone Encounter (Signed)
bw orders put in. Pt notified.  

## 2017-12-21 DIAGNOSIS — E785 Hyperlipidemia, unspecified: Secondary | ICD-10-CM | POA: Diagnosis not present

## 2017-12-21 DIAGNOSIS — R7303 Prediabetes: Secondary | ICD-10-CM | POA: Diagnosis not present

## 2017-12-21 DIAGNOSIS — D649 Anemia, unspecified: Secondary | ICD-10-CM | POA: Diagnosis not present

## 2017-12-22 ENCOUNTER — Other Ambulatory Visit: Payer: Self-pay | Admitting: Pulmonary Disease

## 2017-12-22 ENCOUNTER — Other Ambulatory Visit: Payer: Self-pay | Admitting: Acute Care

## 2017-12-22 LAB — HEMOGLOBIN A1C
Est. average glucose Bld gHb Est-mCnc: 120 mg/dL
HEMOGLOBIN A1C: 5.8 % — AB (ref 4.8–5.6)

## 2017-12-22 LAB — CBC WITH DIFFERENTIAL/PLATELET
BASOS ABS: 0 10*3/uL (ref 0.0–0.2)
BASOS: 0 %
EOS (ABSOLUTE): 0.1 10*3/uL (ref 0.0–0.4)
Eos: 2 %
HEMOGLOBIN: 11.5 g/dL (ref 11.1–15.9)
Hematocrit: 33.7 % — ABNORMAL LOW (ref 34.0–46.6)
IMMATURE GRANS (ABS): 0 10*3/uL (ref 0.0–0.1)
Immature Granulocytes: 0 %
LYMPHS: 18 %
Lymphocytes Absolute: 1.2 10*3/uL (ref 0.7–3.1)
MCH: 30.7 pg (ref 26.6–33.0)
MCHC: 34.1 g/dL (ref 31.5–35.7)
MCV: 90 fL (ref 79–97)
MONOCYTES: 8 %
Monocytes Absolute: 0.5 10*3/uL (ref 0.1–0.9)
NEUTROS ABS: 4.8 10*3/uL (ref 1.4–7.0)
Neutrophils: 72 %
Platelets: 242 10*3/uL (ref 150–450)
RBC: 3.75 x10E6/uL — ABNORMAL LOW (ref 3.77–5.28)
RDW: 12.5 % (ref 12.3–15.4)
WBC: 6.7 10*3/uL (ref 3.4–10.8)

## 2017-12-22 LAB — LIPID PANEL
CHOL/HDL RATIO: 2.2 ratio (ref 0.0–4.4)
Cholesterol, Total: 190 mg/dL (ref 100–199)
HDL: 88 mg/dL (ref >39)
LDL Calculated: 88 mg/dL (ref 0–99)
TRIGLYCERIDES: 68 mg/dL (ref 0–149)
VLDL Cholesterol Cal: 14 mg/dL (ref 5–40)

## 2017-12-26 ENCOUNTER — Encounter: Payer: Self-pay | Admitting: Family Medicine

## 2017-12-26 ENCOUNTER — Ambulatory Visit (INDEPENDENT_AMBULATORY_CARE_PROVIDER_SITE_OTHER): Payer: Medicare Other | Admitting: Family Medicine

## 2017-12-26 VITALS — BP 110/70 | Ht 64.5 in | Wt 161.0 lb

## 2017-12-26 DIAGNOSIS — R7303 Prediabetes: Secondary | ICD-10-CM

## 2017-12-26 DIAGNOSIS — E785 Hyperlipidemia, unspecified: Secondary | ICD-10-CM

## 2017-12-26 MED ORDER — NORTRIPTYLINE HCL 10 MG PO CAPS: 30.0000 mg | ORAL_CAPSULE | Freq: Every day | ORAL | 5 refills | Status: DC

## 2017-12-26 MED ORDER — LISINOPRIL 20 MG PO TABS: ORAL_TABLET | ORAL | 1 refills | Status: DC

## 2017-12-26 MED ORDER — FUROSEMIDE 20 MG PO TABS: 20.0000 mg | ORAL_TABLET | Freq: Every morning | ORAL | 1 refills | Status: DC

## 2017-12-26 NOTE — Progress Notes (Signed)
   Subjective:    Patient ID: [name], female    DOB: November 28, 1928, 84 y.o.   MRN: [id number]  HPI  Patient is here today to follow up on chronic health issues. She stats she has a healthy appetite and has not been exercising since her eye surgery. Patient denies being depressed No falls Overall doing well Recently had cataracts removed Just now starting to get back into hoping to become a little more active but unable to do the walking that she was doing   She sees [place] a pulmonologist in [place].  Review of Systems  Constitutional: Negative for activity change, appetite change and fatigue.  HENT: Negative for congestion and rhinorrhea.   Respiratory: Negative for cough and shortness of breath.   Cardiovascular: Negative for chest pain and leg swelling.  Gastrointestinal: Negative for abdominal pain and diarrhea.  Endocrine: Negative for polydipsia and polyphagia.  Skin: Negative for color change.  Neurological: Negative for dizziness and weakness.  Psychiatric/Behavioral: Negative for behavioral problems and confusion.       Objective:   Physical Exam  Constitutional: She appears well-nourished. No distress.  HENT:  Head: Normocephalic and atraumatic.  Eyes: Right eye exhibits no discharge. Left eye exhibits no discharge.  Neck: No tracheal deviation present.  Cardiovascular: Normal rate, regular rhythm and normal heart sounds.  No murmur heard. Pulmonary/Chest: Effort normal and breath sounds normal. No respiratory distress.  Musculoskeletal: She exhibits no edema.  Lymphadenopathy:    She has no cervical adenopathy.  Neurological: She is alert. Coordination normal.  Skin: Skin is warm and dry.  Psychiatric: She has a normal mood and affect. Her behavior is normal.  Vitals reviewed.         Assessment & Plan:  Prediabetes watch diet patient is trying to bring her weight down she is trying to minimize needs and starches  Hyperlipidemia lab work  looks much better she is doing a good job watching her diet and gradually bringing it down  Hemoglobin stable continue current measures  Patient recently been hobbled because of her knees but hopefully these will do better over time-currently getting injections  Follow-up in 6 months

## 2017-12-26 NOTE — Patient Instructions (Signed)
Results for orders placed or performed in visit on 12/15/17  Lipid panel  Result Value Ref Range   Cholesterol, Total 190 100 - 199 mg/dL   Triglycerides 68 0 - 149 mg/dL   HDL 88 >39 mg/dL   VLDL Cholesterol Cal 14 5 - 40 mg/dL   LDL Calculated 88 0 - 99 mg/dL   Chol/HDL Ratio 2.2 0.0 - 4.4 ratio  CBC with Differential/Platelet  Result Value Ref Range   WBC 6.7 3.4 - 10.8 x10E3/uL   RBC 3.75 (L) 3.77 - 5.28 x10E6/uL   Hemoglobin 11.5 11.1 - 15.9 g/dL   Hematocrit 33.7 (L) 34.0 - 46.6 %   MCV 90 79 - 97 fL   MCH 30.7 26.6 - 33.0 pg   MCHC 34.1 31.5 - 35.7 g/dL   RDW 12.5 12.3 - 15.4 %   Platelets 242 150 - 450 x10E3/uL   Neutrophils 72 Not Estab. %   Lymphs 18 Not Estab. %   Monocytes 8 Not Estab. %   Eos 2 Not Estab. %   Basos 0 Not Estab. %   Neutrophils Absolute 4.8 1.4 - 7.0 x10E3/uL   Lymphocytes Absolute 1.2 0.7 - 3.1 x10E3/uL   Monocytes Absolute 0.5 0.1 - 0.9 x10E3/uL   EOS (ABSOLUTE) 0.1 0.0 - 0.4 x10E3/uL   Basophils Absolute 0.0 0.0 - 0.2 x10E3/uL   Immature Granulocytes 0 Not Estab. %   Immature Grans (Abs) 0.0 0.0 - 0.1 x10E3/uL  Hemoglobin A1c  Result Value Ref Range   Hgb A1c MFr Bld 5.8 (H) 4.8 - 5.6 %   Est. average glucose Bld gHb Est-mCnc 120 mg/dL

## 2017-12-29 ENCOUNTER — Other Ambulatory Visit (HOSPITAL_COMMUNITY): Payer: Self-pay | Admitting: *Deleted

## 2017-12-29 DIAGNOSIS — C50911 Malignant neoplasm of unspecified site of right female breast: Secondary | ICD-10-CM

## 2017-12-29 DIAGNOSIS — Z170 Estrogen receptor positive status [ER+]: Principal | ICD-10-CM

## 2017-12-29 NOTE — Progress Notes (Signed)
Patient called clinic stating that she had some bright red blood in her urine today. This has happened once before about 6 weeks ago and she felt like she had a urinary infection.  She denies any urinary symptoms.  Appointments made for tomorrow for lab to collect a urine sample for urinalysis and culture.  Patient verbalizes understanding.

## 2017-12-30 ENCOUNTER — Inpatient Hospital Stay (HOSPITAL_COMMUNITY): Payer: Medicare Other | Attending: Hematology

## 2017-12-30 DIAGNOSIS — Z170 Estrogen receptor positive status [ER+]: Secondary | ICD-10-CM | POA: Insufficient documentation

## 2017-12-30 DIAGNOSIS — Z79811 Long term (current) use of aromatase inhibitors: Secondary | ICD-10-CM | POA: Insufficient documentation

## 2017-12-30 DIAGNOSIS — R319 Hematuria, unspecified: Secondary | ICD-10-CM | POA: Diagnosis not present

## 2017-12-30 DIAGNOSIS — C50411 Malignant neoplasm of upper-outer quadrant of right female breast: Secondary | ICD-10-CM | POA: Diagnosis not present

## 2017-12-30 DIAGNOSIS — C50911 Malignant neoplasm of unspecified site of right female breast: Secondary | ICD-10-CM

## 2017-12-30 DIAGNOSIS — Z923 Personal history of irradiation: Secondary | ICD-10-CM | POA: Diagnosis not present

## 2017-12-30 LAB — URINALYSIS, ROUTINE W REFLEX MICROSCOPIC
Bilirubin Urine: NEGATIVE
GLUCOSE, UA: NEGATIVE mg/dL
KETONES UR: NEGATIVE mg/dL
LEUKOCYTES UA: NEGATIVE
NITRITE: NEGATIVE
PH: 5 (ref 5.0–8.0)
PROTEIN: NEGATIVE mg/dL
Specific Gravity, Urine: 1.012 (ref 1.005–1.030)

## 2017-12-31 LAB — URINE CULTURE

## 2018-01-03 ENCOUNTER — Telehealth (HOSPITAL_COMMUNITY): Payer: Self-pay | Admitting: *Deleted

## 2018-01-03 ENCOUNTER — Encounter (HOSPITAL_COMMUNITY): Payer: Self-pay | Admitting: Hematology

## 2018-01-03 NOTE — Progress Notes (Signed)
Faxed referral/patient records to [facility] for hematuria.  [phone].  Faxed transmission log confirmation received.

## 2018-01-03 NOTE — Telephone Encounter (Signed)
01/02/18 - Pt left voicemail message requesting recent UA results.  Contacted pt to let her know of results.  She states that she stopped taking her Arimidex for 3 days and had no urinary s/s.  Reports she resumed Arimidex Sunday night and noticed blood in her urine this (Monday) morning after voiding her bladder.  She denies that this is vaginal bleeding and reports she is certain that it is blood in her urine coming from her urethra.  Notified T. Sheldon Silvan, PA-C regarding this - instructed to advise pt to hold Arimidex x 1 week, and that we will refer her to urology.  Pt notified and verbalizes understanding. Urology referral made.

## 2018-01-11 ENCOUNTER — Ambulatory Visit (INDEPENDENT_AMBULATORY_CARE_PROVIDER_SITE_OTHER): Payer: Medicare Other | Admitting: Urology

## 2018-01-11 DIAGNOSIS — R350 Frequency of micturition: Secondary | ICD-10-CM

## 2018-01-11 DIAGNOSIS — R310 Gross hematuria: Secondary | ICD-10-CM | POA: Diagnosis not present

## 2018-01-16 ENCOUNTER — Other Ambulatory Visit: Payer: Self-pay | Admitting: Urology

## 2018-01-16 DIAGNOSIS — R310 Gross hematuria: Secondary | ICD-10-CM

## 2018-01-25 ENCOUNTER — Encounter: Payer: Self-pay | Admitting: Family Medicine

## 2018-01-25 ENCOUNTER — Ambulatory Visit (INDEPENDENT_AMBULATORY_CARE_PROVIDER_SITE_OTHER): Payer: Medicare Other | Admitting: Family Medicine

## 2018-01-25 VITALS — BP 146/76 | Ht 64.5 in | Wt 161.0 lb

## 2018-01-25 DIAGNOSIS — I10 Essential (primary) hypertension: Secondary | ICD-10-CM

## 2018-01-25 MED ORDER — AMLODIPINE BESYLATE 2.5 MG PO TABS: 2.5000 mg | ORAL_TABLET | Freq: Every day | ORAL | 3 refills | Status: DC

## 2018-01-25 NOTE — Progress Notes (Signed)
   Subjective:    Patient ID: [name], female    DOB: 1928/08/26, 84 y.o.   MRN: [id number]  HPI Patient is here today to follow up on her bp. She states her bp for the last three days has been elevated and this concerned her. She has brought the last few days readings with her.She is taking Lasix 20 mg one po qd, and Lisinopril 20 mg one po Qd . She says she also has a pain that radiates around her right side under breast, she noticed this today.  Patient has gained some weight with her cancer treatments She also relates a sharp pain under the right side when she takes a deep breath but no shortness of breath with it this is only occurred recently with coughing no high fever chills or sweats no hemoptysis Review of Systems  Constitutional: Negative for activity change, fatigue and fever.  HENT: Negative for congestion and rhinorrhea.   Respiratory: Negative for cough, chest tightness and shortness of breath.   Cardiovascular: Negative for chest pain and leg swelling.  Gastrointestinal: Negative for abdominal pain and nausea.  Skin: Negative for color change.  Neurological: Negative for dizziness and headaches.  Psychiatric/Behavioral: Negative for agitation and behavioral problems.       Objective:   Physical Exam  Constitutional: She appears well-nourished. No distress.  HENT:  Head: Normocephalic.  Cardiovascular: Normal rate, regular rhythm and normal heart sounds.  No murmur heard. Pulmonary/Chest: Effort normal and breath sounds normal.  Musculoskeletal: She exhibits no edema.  Lymphadenopathy:    She has no cervical adenopathy.  Neurological: She is alert.  Psychiatric: Her behavior is normal.  Vitals reviewed.  Blood pressure multiple times best reading was still elevated       Assessment & Plan:  HTN Systolic going up Add amlodipine 2.5 mg daily Keep everything else as is Low-salt diet Follow-up in 2 weeks sooner if any problems I do not feel the  patient having any cardiac issues

## 2018-01-25 NOTE — Patient Instructions (Signed)
DASH Eating Plan DASH stands for "Dietary Approaches to Stop Hypertension." The DASH eating plan is a healthy eating plan that has been shown to reduce high blood pressure (hypertension). It may also reduce your risk for type 2 diabetes, heart disease, and stroke. The DASH eating plan may also help with weight loss. What are tips for following this plan? General guidelines  Avoid eating more than 2,300 mg (milligrams) of salt (sodium) a day. If you have hypertension, you may need to reduce your sodium intake to 1,500 mg a day.  Limit alcohol intake to no more than 1 drink a day for nonpregnant women and 2 drinks a day for men. One drink equals 12 oz of beer, 5 oz of wine, or 1 oz of hard liquor.  Work with your health care provider to maintain a healthy body weight or to lose weight. Ask what an ideal weight is for you.  Get at least 30 minutes of exercise that causes your heart to beat faster (aerobic exercise) most days of the week. Activities may include walking, swimming, or biking.  Work with your health care provider or diet and nutrition specialist (dietitian) to adjust your eating plan to your individual calorie needs. Reading food labels  Check food labels for the amount of sodium per serving. Choose foods with less than 5 percent of the Daily Value of sodium. Generally, foods with less than 300 mg of sodium per serving fit into this eating plan.  To find whole grains, look for the word "whole" as the first word in the ingredient list. Shopping  Buy products labeled as "low-sodium" or "no salt added."  Buy fresh foods. Avoid canned foods and premade or frozen meals. Cooking  Avoid adding salt when cooking. Use salt-free seasonings or herbs instead of table salt or sea salt. Check with your health care provider or pharmacist before using salt substitutes.  Do not fry foods. Cook foods using healthy methods such as baking, boiling, grilling, and broiling instead.  Cook with  heart-healthy oils, such as olive, canola, soybean, or sunflower oil. Meal planning   Eat a balanced diet that includes: ? 5 or more servings of fruits and vegetables each day. At each meal, try to fill half of your plate with fruits and vegetables. ? Up to 6-8 servings of whole grains each day. ? Less than 6 oz of lean meat, poultry, or fish each day. A 3-oz serving of meat is about the same size as a deck of cards. One egg equals 1 oz. ? 2 servings of low-fat dairy each day. ? A serving of nuts, seeds, or beans 5 times each week. ? Heart-healthy fats. Healthy fats called Omega-3 fatty acids are found in foods such as flaxseeds and coldwater fish, like sardines, salmon, and mackerel.  Limit how much you eat of the following: ? Canned or prepackaged foods. ? Food that is high in trans fat, such as fried foods. ? Food that is high in saturated fat, such as fatty meat. ? Sweets, desserts, sugary drinks, and other foods with added sugar. ? Full-fat dairy products.  Do not salt foods before eating.  Try to eat at least 2 vegetarian meals each week.  Eat more home-cooked food and less restaurant, buffet, and fast food.  When eating at a restaurant, ask that your food be prepared with less salt or no salt, if possible. What foods are recommended? The items listed may not be a complete list. Talk with your dietitian about what   dietary choices are best for you. Grains Whole-grain or whole-wheat bread. Whole-grain or whole-wheat pasta. Brown rice. Oatmeal. Quinoa. Bulgur. Whole-grain and low-sodium cereals. Pita bread. Low-fat, low-sodium crackers. Whole-wheat flour tortillas. Vegetables Fresh or frozen vegetables (raw, steamed, roasted, or grilled). Low-sodium or reduced-sodium tomato and vegetable juice. Low-sodium or reduced-sodium tomato sauce and tomato paste. Low-sodium or reduced-sodium canned vegetables. Fruits All fresh, dried, or frozen fruit. Canned fruit in natural juice (without  added sugar). Meat and other protein foods Skinless chicken or turkey. Ground chicken or turkey. Pork with fat trimmed off. Fish and seafood. Egg whites. Dried beans, peas, or lentils. Unsalted nuts, nut butters, and seeds. Unsalted canned beans. Lean cuts of beef with fat trimmed off. Low-sodium, lean deli meat. Dairy Low-fat (1%) or fat-free (skim) milk. Fat-free, low-fat, or reduced-fat cheeses. Nonfat, low-sodium ricotta or cottage cheese. Low-fat or nonfat yogurt. Low-fat, low-sodium cheese. Fats and oils Soft margarine without trans fats. Vegetable oil. Low-fat, reduced-fat, or light mayonnaise and salad dressings (reduced-sodium). Canola, safflower, olive, soybean, and sunflower oils. Avocado. Seasoning and other foods Herbs. Spices. Seasoning mixes without salt. Unsalted popcorn and pretzels. Fat-free sweets. What foods are not recommended? The items listed may not be a complete list. Talk with your dietitian about what dietary choices are best for you. Grains Baked goods made with fat, such as croissants, muffins, or some breads. Dry pasta or rice meal packs. Vegetables Creamed or fried vegetables. Vegetables in a cheese sauce. Regular canned vegetables (not low-sodium or reduced-sodium). Regular canned tomato sauce and paste (not low-sodium or reduced-sodium). Regular tomato and vegetable juice (not low-sodium or reduced-sodium). Pickles. Olives. Fruits Canned fruit in a light or heavy syrup. Fried fruit. Fruit in cream or butter sauce. Meat and other protein foods Fatty cuts of meat. Ribs. Fried meat. Bacon. Sausage. Bologna and other processed lunch meats. Salami. Fatback. Hotdogs. Bratwurst. Salted nuts and seeds. Canned beans with added salt. Canned or smoked fish. Whole eggs or egg yolks. Chicken or turkey with skin. Dairy Whole or 2% milk, cream, and half-and-half. Whole or full-fat cream cheese. Whole-fat or sweetened yogurt. Full-fat cheese. Nondairy creamers. Whipped toppings.  Processed cheese and cheese spreads. Fats and oils Butter. Stick margarine. Lard. Shortening. Ghee. Bacon fat. Tropical oils, such as coconut, palm kernel, or palm oil. Seasoning and other foods Salted popcorn and pretzels. Onion salt, garlic salt, seasoned salt, table salt, and sea salt. Worcestershire sauce. Tartar sauce. Barbecue sauce. Teriyaki sauce. Soy sauce, including reduced-sodium. Steak sauce. Canned and packaged gravies. Fish sauce. Oyster sauce. Cocktail sauce. Horseradish that you find on the shelf. Ketchup. Mustard. Meat flavorings and tenderizers. Bouillon cubes. Hot sauce and Tabasco sauce. Premade or packaged marinades. Premade or packaged taco seasonings. Relishes. Regular salad dressings. Where to find more information:  National Heart, Lung, and Blood Institute: www.nhlbi.nih.gov  American Heart Association: www.heart.org Summary  The DASH eating plan is a healthy eating plan that has been shown to reduce high blood pressure (hypertension). It may also reduce your risk for type 2 diabetes, heart disease, and stroke.  With the DASH eating plan, you should limit salt (sodium) intake to 2,300 mg a day. If you have hypertension, you may need to reduce your sodium intake to 1,500 mg a day.  When on the DASH eating plan, aim to eat more fresh fruits and vegetables, whole grains, lean proteins, low-fat dairy, and heart-healthy fats.  Work with your health care provider or diet and nutrition specialist (dietitian) to adjust your eating plan to your individual   calorie needs. This information is not intended to replace advice given to you by your health care provider. Make sure you discuss any questions you have with your health care provider. Document Released: 02/18/2011 Document Revised: 02/23/2016 Document Reviewed: 02/23/2016 Elsevier Interactive Patient Education  2018 [employer]  

## 2018-02-01 ENCOUNTER — Ambulatory Visit (HOSPITAL_COMMUNITY)
Admission: RE | Admit: 2018-02-01 | Discharge: 2018-02-01 | Disposition: A | Discharge status: Home / Self Care | Payer: Medicare Other | Source: Ambulatory Visit | Attending: Urology | Admitting: Urology

## 2018-02-01 ENCOUNTER — Other Ambulatory Visit: Payer: Self-pay | Admitting: Urology

## 2018-02-01 DIAGNOSIS — R310 Gross hematuria: Secondary | ICD-10-CM | POA: Diagnosis not present

## 2018-02-01 DIAGNOSIS — N200 Calculus of kidney: Secondary | ICD-10-CM | POA: Diagnosis not present

## 2018-02-01 DIAGNOSIS — R911 Solitary pulmonary nodule: Secondary | ICD-10-CM | POA: Insufficient documentation

## 2018-02-01 LAB — POCT I-STAT CREATININE: CREATININE: 3.1 mg/dL — AB (ref 0.44–1.00)

## 2018-02-01 MED ORDER — IOPAMIDOL (ISOVUE-300) INJECTION 61%: 150.0000 mL | Freq: Once | INTRAVENOUS | Status: DC | PRN

## 2018-02-02 ENCOUNTER — Ambulatory Visit (INDEPENDENT_AMBULATORY_CARE_PROVIDER_SITE_OTHER): Payer: Medicare Other | Admitting: Family Medicine

## 2018-02-02 ENCOUNTER — Encounter: Payer: Self-pay | Admitting: Family Medicine

## 2018-02-02 VITALS — BP 144/86 | Ht 64.5 in | Wt 164.4 lb

## 2018-02-02 DIAGNOSIS — I10 Essential (primary) hypertension: Secondary | ICD-10-CM

## 2018-02-02 DIAGNOSIS — R7989 Other specified abnormal findings of blood chemistry: Secondary | ICD-10-CM

## 2018-02-02 DIAGNOSIS — D6489 Other specified anemias: Secondary | ICD-10-CM | POA: Diagnosis not present

## 2018-02-02 NOTE — Progress Notes (Signed)
   Subjective:    Patient ID: [name], female    DOB: [birth year], 84 y.o.   MRN: [id number]  HPI  Patient had CT scan and blood work yesterday thru urology and has arrives today to get the results. Patient has not received results from urology yet.  The patient is having hematuria had a CT scan of the abdomen showed a possible lesion in the ureter on the left side she is scheduled to see urology again in December in addition to this she also has an area that is enlarging in the lung which could be bronchogenic cancer but we will interact with her pulmonologist regarding this  25 minutes spent with the patient discussing the results of these test plus also the elevation of the creatinine in order to help figure out what will be the next most appropriate step for this patient. Review of Systems  Constitutional: Negative for activity change, appetite change and fatigue.  HENT: Negative for congestion and rhinorrhea.   Respiratory: Negative for cough and shortness of breath.   Cardiovascular: Negative for chest pain and leg swelling.  Gastrointestinal: Negative for abdominal pain and diarrhea.  Endocrine: Negative for polydipsia and polyphagia.  Genitourinary: Positive for frequency and hematuria. Negative for dysuria.  Skin: Negative for color change.  Neurological: Negative for dizziness and weakness.  Psychiatric/Behavioral: Negative for behavioral problems and confusion.       Objective:   Physical Exam  Constitutional: She appears well-nourished. No distress.  HENT:  Head: Normocephalic and atraumatic.  Eyes: Right eye exhibits no discharge. Left eye exhibits no discharge.  Neck: No tracheal deviation present.  Cardiovascular: Normal rate, regular rhythm and normal heart sounds.  No murmur heard. Pulmonary/Chest: Effort normal and breath sounds normal. No respiratory distress.  Musculoskeletal: She exhibits no edema.  Lymphadenopathy:    She has no cervical adenopathy.   Neurological: She is alert. Coordination normal.  Skin: Skin is warm and dry.  Psychiatric: She has a normal mood and affect. Her behavior is normal.  Vitals reviewed.          Assessment & Plan:  Acute renal failure-significant creatinine elevation Repeat lab work stopped lisinopril recheck patient on Tuesday she will do her lab work on Monday she will follow-up on Tuesday we will recheck blood pressure may need to adjust  We will also coordinate care with oncology and urology regarding her situation plus also initiate nephrology consult depending on her lab test results that come back on Monday  25 minutes was spent with the patient.  This statement verifies that 25 minutes was indeed spent with the patient.  More than 50% of this visit-total duration of the visit-was spent in counseling and coordination of care. The issues that the patient came in for today as reflected in the diagnosis (s) please refer to documentation for further details.

## 2018-02-06 DIAGNOSIS — R7989 Other specified abnormal findings of blood chemistry: Secondary | ICD-10-CM | POA: Diagnosis not present

## 2018-02-06 DIAGNOSIS — D6489 Other specified anemias: Secondary | ICD-10-CM | POA: Diagnosis not present

## 2018-02-07 ENCOUNTER — Ambulatory Visit (INDEPENDENT_AMBULATORY_CARE_PROVIDER_SITE_OTHER): Payer: Medicare Other | Admitting: Family Medicine

## 2018-02-07 ENCOUNTER — Encounter: Payer: Self-pay | Admitting: Family Medicine

## 2018-02-07 VITALS — BP 122/74 | Ht 64.5 in | Wt 164.8 lb

## 2018-02-07 DIAGNOSIS — I10 Essential (primary) hypertension: Secondary | ICD-10-CM | POA: Diagnosis not present

## 2018-02-07 DIAGNOSIS — R7989 Other specified abnormal findings of blood chemistry: Secondary | ICD-10-CM

## 2018-02-07 DIAGNOSIS — N179 Acute kidney failure, unspecified: Secondary | ICD-10-CM

## 2018-02-07 LAB — BASIC METABOLIC PANEL
BUN/Creatinine Ratio: 17 (ref 12–28)
BUN: 47 mg/dL — AB (ref 8–27)
CO2: 22 mmol/L (ref 20–29)
CREATININE: 2.77 mg/dL — AB (ref 0.57–1.00)
Calcium: 10 mg/dL (ref 8.7–10.3)
Chloride: 103 mmol/L (ref 96–106)
GFR calc non Af Amer: 15 mL/min/{1.73_m2} — ABNORMAL LOW (ref >59)
GFR, EST AFRICAN AMERICAN: 17 mL/min/{1.73_m2} — AB (ref >59)
GLUCOSE: 103 mg/dL — AB (ref 65–99)
Potassium: 4.9 mmol/L (ref 3.5–5.2)
SODIUM: 142 mmol/L (ref 134–144)

## 2018-02-07 LAB — CBC WITH DIFFERENTIAL/PLATELET
BASOS ABS: 0 10*3/uL (ref 0.0–0.2)
BASOS: 0 %
EOS (ABSOLUTE): 0.3 10*3/uL (ref 0.0–0.4)
Eos: 3 %
HEMOGLOBIN: 10.1 g/dL — AB (ref 11.1–15.9)
Hematocrit: 30.8 % — ABNORMAL LOW (ref 34.0–46.6)
IMMATURE GRANS (ABS): 0 10*3/uL (ref 0.0–0.1)
Immature Granulocytes: 0 %
LYMPHS ABS: 1.5 10*3/uL (ref 0.7–3.1)
Lymphs: 16 %
MCH: 31 pg (ref 26.6–33.0)
MCHC: 32.8 g/dL (ref 31.5–35.7)
MCV: 95 fL (ref 79–97)
Monocytes Absolute: 0.7 10*3/uL (ref 0.1–0.9)
Monocytes: 7 %
Neutrophils Absolute: 6.9 10*3/uL (ref 1.4–7.0)
Neutrophils: 74 %
PLATELETS: 267 10*3/uL (ref 150–450)
RBC: 3.26 x10E6/uL — ABNORMAL LOW (ref 3.77–5.28)
RDW: 12.7 % (ref 12.3–15.4)
WBC: 9.4 10*3/uL (ref 3.4–10.8)

## 2018-02-07 NOTE — Progress Notes (Signed)
Subjective:    Patient ID: [name], female    DOB: 1928-10-03, 84 y.o.   MRN: [id number]  HPI Pt here today for follow up from last week. Pt did have blood work done yesterday as directed. Provider wanted patient to have blood work done then come in for office visit.  The patient states she has been feeling better since stopping her lisinopril and her Lasix she states she feels better more energetic she denies any chest tightness pressure pain or shortness of breath denies any swelling in the legs.  She denies any fever chills sweats she is eating and drinking appropriately.  Review of Systems  Constitutional: Negative for activity change, appetite change and fatigue.  HENT: Negative for congestion and rhinorrhea.   Respiratory: Negative for cough and shortness of breath.   Cardiovascular: Negative for chest pain and leg swelling.  Gastrointestinal: Negative for abdominal pain and diarrhea.  Endocrine: Negative for polydipsia and polyphagia.  Skin: Negative for color change.  Neurological: Negative for dizziness and weakness.  Psychiatric/Behavioral: Negative for behavioral problems and confusion.       Objective:   Physical Exam  Constitutional: She appears well-nourished. No distress.  HENT:  Head: Normocephalic and atraumatic.  Eyes: Right eye exhibits no discharge. Left eye exhibits no discharge.  Neck: No tracheal deviation present.  Cardiovascular: Normal rate, regular rhythm and normal heart sounds.  No murmur heard. Pulmonary/Chest: Effort normal and breath sounds normal. No respiratory distress.  Musculoskeletal: She exhibits no edema.  Lymphadenopathy:    She has no cervical adenopathy.  Neurological: She is alert. Coordination normal.  Skin: Skin is warm and dry.  Psychiatric: She has a normal mood and affect. Her behavior is normal.  Vitals reviewed. Abdomen is soft no guarding rebound or tenderness   Results for orders placed or performed in visit on  02/02/18  CBC with Differential/Platelet  Result Value Ref Range   WBC 9.4 3.4 - 10.8 x10E3/uL   RBC 3.26 (L) 3.77 - 5.28 x10E6/uL   Hemoglobin 10.1 (L) 11.1 - 15.9 g/dL   Hematocrit 30.8 (L) 34.0 - 46.6 %   MCV 95 79 - 97 fL   MCH 31.0 26.6 - 33.0 pg   MCHC 32.8 31.5 - 35.7 g/dL   RDW 12.7 12.3 - 15.4 %   Platelets 267 150 - 450 x10E3/uL   Neutrophils 74 Not Estab. %   Lymphs 16 Not Estab. %   Monocytes 7 Not Estab. %   Eos 3 Not Estab. %   Basos 0 Not Estab. %   Neutrophils Absolute 6.9 1.4 - 7.0 x10E3/uL   Lymphocytes Absolute 1.5 0.7 - 3.1 x10E3/uL   Monocytes Absolute 0.7 0.1 - 0.9 x10E3/uL   EOS (ABSOLUTE) 0.3 0.0 - 0.4 x10E3/uL   Basophils Absolute 0.0 0.0 - 0.2 x10E3/uL   Immature Granulocytes 0 Not Estab. %   Immature Grans (Abs) 0.0 0.0 - 0.1 V40G8/QP  Basic metabolic panel  Result Value Ref Range   Glucose 103 (H) 65 - 99 mg/dL   BUN 47 (H) 8 - 27 mg/dL   Creatinine, Ser 2.77 (H) 0.57 - 1.00 mg/dL   GFR calc non Af Amer 15 (L) >59 mL/min/1.73   GFR calc Af Amer 17 (L) >59 mL/min/1.73   BUN/Creatinine Ratio 17 12 - 28   Sodium 142 134 - 144 mmol/L   Potassium 4.9 3.5 - 5.2 mmol/L   Chloride 103 96 - 106 mmol/L   CO2 22 20 - 29 mmol/L  Calcium 10.0 8.7 - 10.3 mg/dL   Patient will avoid excessive sodium.  She will also try to eat healthy as well       Assessment & Plan:  Her creatinine was 3.1 it is now down to 2.77.  Her BUN is still elevated.  I encouraged her to drink more fluids.  I encourage patient to do a follow-up lab tests in 2 to 3 weeks.  She has an appointment coming up with urology regarding a abnormal CAT scan for hematuria work-up which showed a ureter enlargement which could be a cancer.  She will need further testing   Patient was told that she gets worse to follow-up immediately Patient did have recent CT scan which did not show any tumors of the kidney Recent urinalysis did not show any proteinuria  If her creatinine does not come down  below 2 within the next 2 weeks the next step is for [place] to go ahead and get her in with nephrology for further consultation.  I have told her to stay off of the lisinopril and furosemide.  She will continue the amlodipine.

## 2018-02-08 ENCOUNTER — Ambulatory Visit: Payer: Medicare Other | Admitting: Family Medicine

## 2018-02-20 DIAGNOSIS — I10 Essential (primary) hypertension: Secondary | ICD-10-CM | POA: Diagnosis not present

## 2018-02-20 DIAGNOSIS — R7989 Other specified abnormal findings of blood chemistry: Secondary | ICD-10-CM | POA: Diagnosis not present

## 2018-02-21 ENCOUNTER — Other Ambulatory Visit: Payer: Self-pay | Admitting: *Deleted

## 2018-02-21 DIAGNOSIS — R7989 Other specified abnormal findings of blood chemistry: Secondary | ICD-10-CM

## 2018-02-21 LAB — BASIC METABOLIC PANEL
BUN / CREAT RATIO: 15 (ref 12–28)
BUN: 36 mg/dL — ABNORMAL HIGH (ref 8–27)
CO2: 24 mmol/L (ref 20–29)
Calcium: 9.6 mg/dL (ref 8.7–10.3)
Chloride: 101 mmol/L (ref 96–106)
Creatinine, Ser: 2.42 mg/dL — ABNORMAL HIGH (ref 0.57–1.00)
GFR, EST AFRICAN AMERICAN: 20 mL/min/{1.73_m2} — AB (ref >59)
GFR, EST NON AFRICAN AMERICAN: 17 mL/min/{1.73_m2} — AB (ref >59)
Glucose: 121 mg/dL — ABNORMAL HIGH (ref 65–99)
POTASSIUM: 4.4 mmol/L (ref 3.5–5.2)
Sodium: 141 mmol/L (ref 134–144)

## 2018-02-22 ENCOUNTER — Other Ambulatory Visit (HOSPITAL_COMMUNITY)
Admission: RE | Admit: 2018-02-22 | Discharge: 2018-02-22 | Disposition: A | Discharge status: Home / Self Care | Payer: Medicare Other | Source: Other Acute Inpatient Hospital | Attending: Urology | Admitting: Urology

## 2018-02-22 ENCOUNTER — Ambulatory Visit (INDEPENDENT_AMBULATORY_CARE_PROVIDER_SITE_OTHER): Payer: Medicare Other | Admitting: Urology

## 2018-02-22 DIAGNOSIS — R310 Gross hematuria: Secondary | ICD-10-CM | POA: Diagnosis not present

## 2018-02-22 DIAGNOSIS — N3000 Acute cystitis without hematuria: Secondary | ICD-10-CM

## 2018-02-22 DIAGNOSIS — R300 Dysuria: Secondary | ICD-10-CM | POA: Diagnosis not present

## 2018-02-23 ENCOUNTER — Other Ambulatory Visit: Payer: Self-pay | Admitting: Urology

## 2018-02-24 ENCOUNTER — Encounter (HOSPITAL_COMMUNITY)
Admission: RE | Admit: 2018-02-24 | Discharge: 2018-02-24 | Disposition: A | Discharge status: Home / Self Care | Payer: Medicare Other | Source: Ambulatory Visit | Attending: Urology | Admitting: Urology

## 2018-02-25 LAB — URINE CULTURE: Culture: >=100000 — AB

## 2018-02-27 ENCOUNTER — Ambulatory Visit (HOSPITAL_COMMUNITY)
Admission: RE | Admit: 2018-02-27 | Discharge: 2018-02-27 | Disposition: A | Discharge status: Home / Self Care | Payer: Medicare Other | Attending: Urology | Admitting: Urology

## 2018-02-27 ENCOUNTER — Encounter (HOSPITAL_COMMUNITY)
Admission: RE | Disposition: A | Discharge status: Home / Self Care | Payer: Self-pay | Source: Home / Self Care | Attending: Urology

## 2018-02-27 ENCOUNTER — Ambulatory Visit (HOSPITAL_COMMUNITY): Payer: Medicare Other | Admitting: Anesthesiology

## 2018-02-27 ENCOUNTER — Encounter (HOSPITAL_COMMUNITY): Payer: Self-pay | Admitting: *Deleted

## 2018-02-27 ENCOUNTER — Ambulatory Visit (HOSPITAL_COMMUNITY): Payer: Medicare Other

## 2018-02-27 DIAGNOSIS — Z853 Personal history of malignant neoplasm of breast: Secondary | ICD-10-CM | POA: Diagnosis not present

## 2018-02-27 DIAGNOSIS — Z880 Allergy status to penicillin: Secondary | ICD-10-CM | POA: Insufficient documentation

## 2018-02-27 DIAGNOSIS — I10 Essential (primary) hypertension: Secondary | ICD-10-CM | POA: Diagnosis not present

## 2018-02-27 DIAGNOSIS — M1990 Unspecified osteoarthritis, unspecified site: Secondary | ICD-10-CM | POA: Insufficient documentation

## 2018-02-27 DIAGNOSIS — Z79899 Other long term (current) drug therapy: Secondary | ICD-10-CM | POA: Insufficient documentation

## 2018-02-27 DIAGNOSIS — C662 Malignant neoplasm of left ureter: Secondary | ICD-10-CM | POA: Diagnosis not present

## 2018-02-27 DIAGNOSIS — E7800 Pure hypercholesterolemia, unspecified: Secondary | ICD-10-CM | POA: Diagnosis not present

## 2018-02-27 DIAGNOSIS — E119 Type 2 diabetes mellitus without complications: Secondary | ICD-10-CM | POA: Diagnosis not present

## 2018-02-27 DIAGNOSIS — Z885 Allergy status to narcotic agent status: Secondary | ICD-10-CM | POA: Insufficient documentation

## 2018-02-27 DIAGNOSIS — D4122 Neoplasm of uncertain behavior of left ureter: Secondary | ICD-10-CM | POA: Diagnosis not present

## 2018-02-27 DIAGNOSIS — Z87891 Personal history of nicotine dependence: Secondary | ICD-10-CM | POA: Diagnosis not present

## 2018-02-27 DIAGNOSIS — Z881 Allergy status to other antibiotic agents status: Secondary | ICD-10-CM | POA: Diagnosis not present

## 2018-02-27 DIAGNOSIS — J449 Chronic obstructive pulmonary disease, unspecified: Secondary | ICD-10-CM | POA: Diagnosis not present

## 2018-02-27 DIAGNOSIS — N210 Calculus in bladder: Secondary | ICD-10-CM | POA: Diagnosis not present

## 2018-02-27 HISTORY — PX: CYSTOSCOPY W/ URETERAL STENT PLACEMENT: SHX1429

## 2018-02-27 LAB — GLUCOSE, CAPILLARY: Glucose-Capillary: 109 mg/dL — ABNORMAL HIGH (ref 70–99)

## 2018-02-27 SURGERY — CYSTOSCOPY, WITH RETROGRADE PYELOGRAM AND URETERAL STENT INSERTION
Anesthesia: General | Site: Ureter | Laterality: Left

## 2018-02-27 MED ORDER — CEFAZOLIN SODIUM-DEXTROSE 2-4 GM/100ML-% IV SOLN
2.0000 g | INTRAVENOUS | Status: AC
  Administered 2018-02-27: 2 g via INTRAVENOUS

## 2018-02-27 MED ORDER — PROPOFOL 10 MG/ML IV BOLUS
INTRAVENOUS | Status: AC
  Filled 2018-02-27: qty 40

## 2018-02-27 MED ORDER — TRAMADOL HCL 50 MG PO TABS: 50.0000 mg | ORAL_TABLET | Freq: Four times a day (QID) | ORAL | 0 refills | Status: DC | PRN

## 2018-02-27 MED ORDER — DIATRIZOATE MEGLUMINE 30 % UR SOLN
URETHRAL | Status: DC | PRN
  Administered 2018-02-27: 10 mL via URETHRAL

## 2018-02-27 MED ORDER — PROPOFOL 10 MG/ML IV BOLUS
INTRAVENOUS | Status: DC | PRN
  Administered 2018-02-27: 50 mg via INTRAVENOUS
  Administered 2018-02-27: 150 mg via INTRAVENOUS

## 2018-02-27 MED ORDER — PROPOFOL 10 MG/ML IV BOLUS
INTRAVENOUS | Status: AC
  Filled 2018-02-27: qty 20

## 2018-02-27 MED ORDER — CEFAZOLIN SODIUM-DEXTROSE 2-4 GM/100ML-% IV SOLN
INTRAVENOUS | Status: AC
  Filled 2018-02-27: qty 100

## 2018-02-27 MED ORDER — LACTATED RINGERS IV SOLN
INTRAVENOUS | Status: DC
  Administered 2018-02-27: 1000 mL via INTRAVENOUS

## 2018-02-27 MED ORDER — STERILE WATER FOR IRRIGATION IR SOLN
Status: DC | PRN
  Administered 2018-02-27: 3000 mL
  Administered 2018-02-27: 1000 mL

## 2018-02-27 MED ORDER — DEXAMETHASONE SODIUM PHOSPHATE 10 MG/ML IJ SOLN
INTRAMUSCULAR | Status: AC
  Filled 2018-02-27: qty 1

## 2018-02-27 MED ORDER — DIATRIZOATE MEGLUMINE 30 % UR SOLN
URETHRAL | Status: AC
  Filled 2018-02-27: qty 100

## 2018-02-27 MED ORDER — SODIUM CHLORIDE 0.9 % IR SOLN
Status: DC | PRN
  Administered 2018-02-27 (×2): 3000 mL via INTRAVESICAL

## 2018-02-27 MED ORDER — ONDANSETRON HCL 4 MG/2ML IJ SOLN
INTRAMUSCULAR | Status: AC
  Filled 2018-02-27: qty 2

## 2018-02-27 MED ORDER — DEXAMETHASONE SODIUM PHOSPHATE 10 MG/ML IJ SOLN
INTRAMUSCULAR | Status: DC | PRN
  Administered 2018-02-27: 8 mg via INTRAVENOUS

## 2018-02-27 MED ORDER — ONDANSETRON HCL 4 MG/2ML IJ SOLN
INTRAMUSCULAR | Status: DC | PRN
  Administered 2018-02-27: 4 mg via INTRAVENOUS

## 2018-02-27 SURGICAL SUPPLY — 23 items
BAG DRAIN URO TABLE W/ADPT NS (BAG) ×3 IMPLANT
BASKET ZERO TIP NITINOL 2.4FR (BASKET) ×3 IMPLANT
CATH INTERMIT  6FR 70CM (CATHETERS) ×3 IMPLANT
CLOTH BEACON ORANGE TIMEOUT ST (SAFETY) ×3 IMPLANT
DECANTER SPIKE VIAL GLASS SM (MISCELLANEOUS) ×3 IMPLANT
FORCEPS BIOP PIRANHA Y (CUTTING FORCEPS) ×3 IMPLANT
GLOVE BIO SURGEON STRL SZ7 (GLOVE) ×3 IMPLANT
GLOVE BIO SURGEON STRL SZ8 (GLOVE) ×3 IMPLANT
GOWN STRL REUS W/ TWL XL LVL3 (GOWN DISPOSABLE) ×1 IMPLANT
GOWN STRL REUS W/TWL LRG LVL3 (GOWN DISPOSABLE) ×3 IMPLANT
GOWN STRL REUS W/TWL XL LVL3 (GOWN DISPOSABLE) ×2
GUIDEWIRE STR DUAL SENSOR (WIRE) ×3 IMPLANT
GUIDEWIRE STR ZIPWIRE 035X150 (MISCELLANEOUS) ×3 IMPLANT
IV NS IRRIG 3000ML ARTHROMATIC (IV SOLUTION) ×6 IMPLANT
KIT TURNOVER CYSTO (KITS) ×3 IMPLANT
MANIFOLD NEPTUNE II (INSTRUMENTS) ×3 IMPLANT
PACK CYSTO (CUSTOM PROCEDURE TRAY) ×3 IMPLANT
PAD ARMBOARD 7.5X6 YLW CONV (MISCELLANEOUS) ×3 IMPLANT
STENT URET 6FRX24 CONTOUR (STENTS) ×3 IMPLANT
SYR 10ML LL (SYRINGE) ×3 IMPLANT
TOWEL OR 17X26 4PK STRL BLUE (TOWEL DISPOSABLE) ×3 IMPLANT
WATER STERILE IRR 1000ML POUR (IV SOLUTION) ×3 IMPLANT
WATER STERILE IRR 3000ML UROMA (IV SOLUTION) ×3 IMPLANT

## 2018-02-27 NOTE — Transfer of Care (Signed)
Immediate Anesthesia Transfer of Care Note  Patient: [name]  Procedure(s) Performed: CYSTOSCOPY WITH RETROGRADE PYELOGRAM AND URETEROSCOPY WITH BIOPSY AND LEFT URETERAL STENT PLACEMENT (Left Ureter)  Patient Location: PACU  Anesthesia Type:General  Level of Consciousness: awake, alert  and patient cooperative  Airway & Oxygen Therapy: Patient Spontanous Breathing  Post-op Assessment: Report given to RN and Post -op Vital signs reviewed and stable  Post vital signs: Reviewed and stable  Last Vitals:  Vitals Value Taken Time  BP    Temp    Pulse    Resp    SpO2      Last Pain:  Vitals:   02/27/18 0656  TempSrc: Oral  PainSc: 0-No pain      Patients Stated Pain Goal: 7 (62/26/33 3545)  Complications: No apparent anesthesia complications

## 2018-02-27 NOTE — Discharge Instructions (Signed)
Ureteral Stent Implantation, Care After Refer to this sheet in the next few weeks. These instructions provide you with information about caring for yourself after your procedure. Your health care provider may also give you more specific instructions. Your treatment has been planned according to current medical practices, but problems sometimes occur. Call your health care provider if you have any problems or questions after your procedure. What can I expect after the procedure? After the procedure, it is common to have:  Nausea.  Mild pain when you urinate. You may feel this pain in your lower back or lower abdomen. Pain should stop within a few minutes after you urinate. This may last for up to 1 week.  A small amount of blood in your urine for several days.  Follow these instructions at home:  Medicines  Take over-the-counter and prescription medicines only as told by your health care provider.  If you were prescribed an antibiotic medicine, take it as told by your health care provider. Do not stop taking the antibiotic even if you start to feel better.  Do not drive for 24 hours if you received a sedative.  Do not drive or operate heavy machinery while taking prescription pain medicines. Activity  Return to your normal activities as told by your health care provider. Ask your health care provider what activities are safe for you.  Do not lift anything that is heavier than 10 lb (4.5 kg). Follow this limit for 1 week after your procedure, or for as long as told by your health care provider. General instructions  Watch for any blood in your urine. Call your health care provider if the amount of blood in your urine increases.  If you have a catheter: ? Follow instructions from your health care provider about taking care of your catheter and collection bag. ? Do not take baths, swim, or use a hot tub until your health care provider approves.  Drink enough fluid to keep your urine  clear or pale yellow.  Keep all follow-up visits as told by your health care provider. This is important. Contact a health care provider if:  You have pain that gets worse or does not get better with medicine, especially pain when you urinate.  You have difficulty urinating.  You feel nauseous or you vomit repeatedly during a period of more than 2 days after the procedure. Get help right away if:  Your urine is dark red or has blood clots in it.  You are leaking urine (have incontinence).  The end of the stent comes out of your urethra.  You cannot urinate.  You have sudden, sharp, or severe pain in your abdomen or lower back.  You have a fever. This information is not intended to replace advice given to you by your health care provider. Make sure you discuss any questions you have with your health care provider. Document Released: 11/01/2012 Document Revised: 08/07/2015 Document Reviewed: 09/13/2014 Elsevier Interactive Patient Education  2018 [employer], Care After These instructions provide you with information about caring for yourself after your procedure. Your health care provider may also give you more specific instructions. Your treatment has been planned according to current medical practices, but problems sometimes occur. Call your health care provider if you have any problems or questions after your procedure. What can I expect after the procedure? After your procedure, it is common to:  Feel sleepy for several hours.  Feel clumsy and have poor balance for several  hours.  Feel forgetful about what happened after the procedure.  Have poor judgment for several hours.  Feel nauseous or vomit.  Have a sore throat if you had a breathing tube during the procedure.  Follow these instructions at home: For at least 24 hours after the procedure:   Do not: ? Participate in activities in which you could fall or become  injured. ? Drive. ? Use heavy machinery. ? Drink alcohol. ? Take sleeping pills or medicines that cause drowsiness. ? Make important decisions or sign legal documents. ? Take care of children on your own.  Rest. Eating and drinking  Follow the diet that is recommended by your health care provider.  If you vomit, drink water, juice, or soup when you can drink without vomiting.  Make sure you have little or no nausea before eating solid foods. General instructions  Have a responsible adult stay with you until you are awake and alert.  Take over-the-counter and prescription medicines only as told by your health care provider.  If you smoke, do not smoke without supervision.  Keep all follow-up visits as told by your health care provider. This is important. Contact a health care provider if:  You keep feeling nauseous or you keep vomiting.  You feel light-headed.  You develop a rash.  You have a fever. Get help right away if:  You have trouble breathing. This information is not intended to replace advice given to you by your health care provider. Make sure you discuss any questions you have with your health care provider. Document Released: 06/22/2015 Document Revised: 10/22/2015 Document Reviewed: 06/22/2015 Elsevier Interactive Patient Education  [employer].

## 2018-02-27 NOTE — H&P (Signed)
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 She has been having intermittent gross hematuria for several months. No other LUTS. No fevers/chills/sweats  Past Medical History:  Diagnosis Date  . Breast cancer ([facility])   . Breast cancer, right breast ([name]) 05/01/2015  . Cancer ([facility])    BREAST CANCER   . Colon polyps   . COPD (chronic obstructive pulmonary disease) ([name])    DR. [place]  . Diabetes mellitus without complication ([name])    PREDIABETIC  CONTROL W/ DIET  . Hypercholesteremia   . Hypertension   . Lung nodules   . Osteoarthritis   . Osteopenia 09/18/2013  . Osteoporosis   . Personal history of radiation therapy   . PONV (postoperative nausea and vomiting)   . Shortness of breath dyspnea    W/ EXERTION   Past Surgical History:  Procedure Laterality Date  . ABDOMINAL HYSTERECTOMY    . APPENDECTOMY    . BARTHOLIN GLAND CYST EXCISION    . BREAST EXCISIONAL BIOPSY Left    benign  . BREAST LUMPECTOMY WITH RADIOACTIVE SEED LOCALIZATION Right 05/23/2015   Procedure: RIGHT BREAST LUMPECTOMY WITH RADIOACTIVE SEED LOCALIZATION;  Surgeon: Lauren Skates, MD;  Location: [facility];  Service: General;  Laterality: Right;  . BREAST SURGERY     2000  LEFT BREAST  LUMP REMOVED  . colonoscopy    . COLONOSCOPY  03/25/2011   Procedure: COLONOSCOPY;  Surgeon: Lauren Houston, MD;  Location[facility];  Service: Endoscopy;  Laterality: N/A;  9:30 am  . left inguinal hernia repair    . multiple toe surgeries    . TONSILLECTOMY      Home Medications:  Current Facility-Administered Medications  Medication Dose Route Frequency Provider Last Rate Last Dose  . ceFAZolin (ANCEF) IVPB 2g/100 mL premix  2 g Intravenous 30 min Pre-Op Lauren Ingles Candee Furbish, MD      . lactated ringers infusion   Intravenous Continuous Lauren Alcon, MD 50 mL/hr at 02/27/18 2993      Allergies:  Allergies  Allergen Reactions  . Augmentin [Amoxicillin-Pot Clavulanate] Nausea And Vomiting    Has patient had a PCN reaction causing immediate rash, facial/tongue/throat swelling, SOB or lightheadedness with hypotension: No Has patient had a PCN reaction causing severe rash involving mucus membranes or skin necrosis: No Has patient had a PCN reaction that required hospitalization: No Has patient had a PCN reaction occurring within the last 10 years: Unknown If all of the above answers are "NO", then may proceed with Cephalosporin use.    [name] Amoxicillin Diarrhea    Has patient had a PCN reaction causing immediate rash, facial/tongue/throat swelling, SOB or lightheadedness with hypotension: No Has patient had a PCN reaction causing severe rash involving mucus membranes or skin necrosis: No Has patient had a PCN reaction that required hospitalization: No Has patient had a PCN reaction occurring within the last 10 years: Unknown If all of the above answers are "NO", then may proceed with Cephalosporin use.   . Codeine Nausea And Vomiting  . Morphine Nausea And Vomiting  . Neomycin-Bacitracin Zn-Polymyx Itching and Rash    Unable to use Neosporin    Family History  Problem Relation Age of Onset  . Colon cancer Father    Social History:  reports that she quit smoking about 29 years ago. Her smoking use included cigarettes. She  has a 100.00 pack-year smoking history. She has never used smokeless tobacco. She reports that she does not drink alcohol or use drugs.  Review of Systems  Genitourinary: Positive for hematuria.  All other systems reviewed and are negative.   Physical Exam:  Vital signs in last 24 hours: Temp:  [98.3 F (36.8 C)] 98.3 F (36.8 C) (12/16 0656) Resp:  [18] 18 (12/16 0656) BP: (164)/(82) 164/82 (12/16 0656) SpO2:  [95 %] 95 % (12/16 0656) Weight:  [84.4 kg] 84.4 kg (12/16 0656) Physical Exam  Constitutional: She is oriented to person, place,  and time. She appears well-developed and well-nourished.  HENT:  Head: Normocephalic and atraumatic.  Eyes: Pupils are equal, round, and reactive to light. EOM are normal.  Neck: Normal range of motion. No thyromegaly present.  Cardiovascular: Normal rate and regular rhythm.  Respiratory: Effort normal. No respiratory distress.  GI: Soft. She exhibits no distension.  Musculoskeletal: Normal range of motion.        General: No edema.  Neurological: She is alert and oriented to person, place, and time.  Skin: Skin is warm and dry.  Psychiatric: She has a normal mood and affect. Her behavior is normal. Judgment and thought content normal.    Laboratory Data:  No results found for this or any previous visit (from the past 24 hour(s)). Recent Results (from the past 240 hour(s))  Urine Culture     Status: Abnormal   Collection Time: 02/22/18 11:57 AM  Result Value Ref Range Status   Specimen Description   Final    URINE, RANDOM Performed at [facility], [address], [place], [place] [address]    Special Requests   Final    NONE Performed at [facility], [address], [place], [place] [address]    Culture >=100,000 COLONIES/mL ESCHERICHIA COLI (A)  Final   Report Status 02/25/2018 FINAL  Final   Organism ID, Bacteria ESCHERICHIA COLI (A)  Final      Susceptibility   Escherichia coli - MIC*    AMPICILLIN <=2 SENSITIVE Sensitive     CEFAZOLIN <=4 SENSITIVE Sensitive     CEFTRIAXONE <=1 SENSITIVE Sensitive     CIPROFLOXACIN <=0.25 SENSITIVE Sensitive     GENTAMICIN <=1 SENSITIVE Sensitive     IMIPENEM <=0.25 SENSITIVE Sensitive     NITROFURANTOIN <=16 SENSITIVE Sensitive     TRIMETH/SULFA <=20 SENSITIVE Sensitive     AMPICILLIN/SULBACTAM <=2 SENSITIVE Sensitive     PIP/TAZO <=4 SENSITIVE Sensitive     Extended ESBL NEGATIVE Sensitive     * >=100,000 COLONIES/mL ESCHERICHIA COLI   Creatinine: Recent Labs    02/20/18 1333  CREATININE 2.42*   Baseline Creatinine:  1.5  Impression/Assessment:  84yo with left ureteral tumor  Plan:  The risks/benefits/alternaitves to left diagnostic ureteroscopy was explained to the patient and she understands and wishes to proceed with surgery  Nicolette Bang 02/27/2018, 7:57 AM

## 2018-02-27 NOTE — Anesthesia Preprocedure Evaluation (Signed)
Anesthesia Evaluation  Patient identified by MRN, date of birth, ID band Patient awake    Reviewed: Allergy & Precautions, H&P , NPO status , Patient's Chart, lab work & pertinent test results, reviewed documented beta blocker date and time   History of Anesthesia Complications (+) PONV and history of anesthetic complications  Airway Mallampati: I  TM Distance: >3 FB Neck ROM: full    Dental  (+) Edentulous Upper, Edentulous Lower   Pulmonary shortness of breath, COPD, former smoker,    Pulmonary exam normal breath sounds clear to auscultation       Cardiovascular Exercise Tolerance: Good hypertension, negative cardio ROS   Rhythm:regular Rate:Normal     Neuro/Psych  Neuromuscular disease negative psych ROS   GI/Hepatic negative GI ROS, Neg liver ROS,   Endo/Other  negative endocrine ROSdiabetes  Renal/GU negative Renal ROS  negative genitourinary   Musculoskeletal   Abdominal   Peds  Hematology negative hematology ROS (+)   Anesthesia Other Findings   Reproductive/Obstetrics negative OB ROS                             Anesthesia Physical Anesthesia Plan  ASA: III  Anesthesia Plan: General   Post-op Pain Management:    Induction:   PONV Risk Score and Plan:   Airway Management Planned:   Additional Equipment:   Intra-op Plan:   Post-operative Plan:   Informed Consent: I have reviewed the patients History and Physical, chart, labs and discussed the procedure including the risks, benefits and alternatives for the proposed anesthesia with the patient or authorized representative who has indicated his/her understanding and acceptance.   Dental Advisory Given  Plan Discussed with: CRNA  Anesthesia Plan Comments:         Anesthesia Quick Evaluation

## 2018-02-27 NOTE — Anesthesia Procedure Notes (Signed)
Procedure Name: LMA Insertion Date/Time: 02/27/2018 8:20 AM Performed by: Vista Deck, CRNA Pre-anesthesia Checklist: Patient identified, Patient being monitored, Emergency Drugs available, Timeout performed and Suction available Patient Re-evaluated:Patient Re-evaluated prior to induction Oxygen Delivery Method: Circle System Utilized Preoxygenation: Pre-oxygenation with 100% oxygen Induction Type: IV induction Ventilation: Mask ventilation without difficulty LMA: LMA inserted LMA Size: 4.0 Number of attempts: 1 Placement Confirmation: positive ETCO2 and breath sounds checked- equal and bilateral Tube secured with: Tape Dental Injury: Teeth and Oropharynx as per pre-operative assessment

## 2018-02-27 NOTE — Anesthesia Postprocedure Evaluation (Signed)
Anesthesia Post Note  Patient: Meleena A Arrazola  Procedure(s) Performed: CYSTOSCOPY WITH LEFT RETROGRADE PYELOGRAM AND lEFT URETEROSCOPY WITH BIOPSY AND LEFT URETERAL STENT PLACEMENT (Left Ureter)  Patient location during evaluation: Short Stay Anesthesia Type: General Level of consciousness: awake and alert and patient cooperative Pain management: satisfactory to patient Vital Signs Assessment: post-procedure vital signs reviewed and stable Respiratory status: spontaneous breathing Cardiovascular status: stable Postop Assessment: no apparent nausea or vomiting Anesthetic complications: no     Last Vitals:  Vitals:   02/27/18 0915 02/27/18 0925  BP: (!) 157/76 (!) 157/77  Pulse: 79 82  Resp: 20 16  Temp:    SpO2: 100% 100%    Last Pain:  Vitals:   02/27/18 0938  TempSrc:   PainSc: 7                  [place]

## 2018-02-27 NOTE — Op Note (Signed)
.  Preoperative diagnosis: Left ureteral tumor  Postoperative diagnosis: Same  Procedure: 1 cystoscopy 2. Left retrograde pyelography 3.  Intraoperative fluoroscopy, under one hour, with interpretation 4.  Left ureteral tumor ablation and biopsy 5.  Left 6 x 26 JJ stent placement  Attending: Rosie Fate  Anesthesia: General  Estimated blood loss: minimal  Drains: Left 6 x 26 JJ ureteral stent without tether  Specimens: left mid ureteral tumor  Antibiotics: Ancef  Findings: left mid ureteral 1cm tumor. Moderate hydronephrosis. No masses/lesions in the bladder. Ureteral orifices in normal anatomic location.  Indications: Patient is a 84 year old female with a history of gross hematuria who was found to have a ureteral lesion on CT.  After discussing treatment options, she decided proceed with left ureteroscopy with biopsy and tumor ablation.  Procedure her in detail: The patient was brought to the operating room and a brief timeout was done to ensure correct patient, correct procedure, correct site.  General anesthesia was administered patient was placed in dorsal lithotomy position.  Her genitalia was then prepped and draped in usual sterile fashion.  A rigid 53 French cystoscope was passed in the urethra and the bladder.  Bladder was inspected free masses or lesions.  the ureteral orifices were in the normal orthotopic locations.  a 6 french ureteral catheter was then instilled into the left ureteral orifice.  a gentle retrograde was obtained and findings noted above.  we then placed a zip wire through the ureteral catheter and advanced up to the renal pelvis.  we then removed the cystoscope and cannulated the left ureteral orifice with a semirigid ureteroscope.  We encountered a 1cm tumor int he mid ureter. Using a Zero tip basket large specimens were taken. We then used the ureteral bugbee to ablate the remainder of the tumor. we then placed a 6 x 26 double-j ureteral stent over the  original zip wire.   We then removed the wire and good coil was noted in the the renal pelvis under fluoroscopy and the bladder under direct vision.   the stone fragments were then removed from the bladder and sent for analysis.   the bladder was then drained and this concluded the procedure which was well tolerated by patient.  Complications: None  Condition: Stable, extubated, transferred to PACU  Plan: Patient is to be discharged home as to follow-up in 2 weeks for stent removal.

## 2018-02-27 NOTE — Addendum Note (Signed)
Addendum  created 02/27/18 1135 by Vista Deck, CRNA   Intraprocedure Meds edited

## 2018-02-28 ENCOUNTER — Encounter: Payer: Self-pay | Admitting: Family Medicine

## 2018-02-28 ENCOUNTER — Ambulatory Visit (INDEPENDENT_AMBULATORY_CARE_PROVIDER_SITE_OTHER): Payer: Medicare Other | Admitting: Family Medicine

## 2018-02-28 VITALS — BP 134/76 | Ht 62.0 in | Wt 165.0 lb

## 2018-02-28 DIAGNOSIS — I10 Essential (primary) hypertension: Secondary | ICD-10-CM

## 2018-02-28 DIAGNOSIS — R7989 Other specified abnormal findings of blood chemistry: Secondary | ICD-10-CM

## 2018-02-28 NOTE — Progress Notes (Signed)
   Subjective:    Patient ID: [name], female    DOB: Jan 13, 1929, 84 y.o.   MRN: [id number]  Hypertension  This is a chronic problem. Pertinent negatives include no chest pain, headaches or shortness of breath. There are no compliance problems.   amlodipine was stopped 3 weeks ago. Having a little swelling in both. Mostly at night.  Patient recently had acute renal failure but more than likely this was due to obstruction of the ureter from a growth She had urologic procedure yesterday with biopsy as well as stent placement Had mild hydronephrosis Feels somewhat better today but frequency of urination Denies high fever chills Pt states no other concerns.     Review of Systems  Constitutional: Negative for activity change, fatigue and fever.  HENT: Negative for congestion and rhinorrhea.   Respiratory: Negative for cough, chest tightness and shortness of breath.   Cardiovascular: Negative for chest pain and leg swelling.  Gastrointestinal: Negative for abdominal pain and nausea.  Skin: Negative for color change.  Neurological: Negative for dizziness and headaches.  Psychiatric/Behavioral: Negative for agitation and behavioral problems.       Objective:   Physical Exam Vitals signs reviewed.  Constitutional:      General: She is not in acute distress. HENT:     Head: Normocephalic and atraumatic.  Eyes:     General:        Right eye: No discharge.        Left eye: No discharge.  Neck:     Trachea: No tracheal deviation.  Cardiovascular:     Rate and Rhythm: Normal rate and regular rhythm.     Heart sounds: Normal heart sounds. No murmur.  Pulmonary:     Effort: Pulmonary effort is normal. No respiratory distress.     Breath sounds: Normal breath sounds.  Lymphadenopathy:     Cervical: No cervical adenopathy.  Skin:    General: Skin is warm and dry.  Neurological:     Mental Status: She is alert.     Coordination: Coordination normal.  Psychiatric:      Behavior: Behavior normal.   Pedal edema trace noted        Assessment & Plan:  Ureter tumor certainly this raises some concerns await the pathology results will discuss with urology  Recheck metabolic 7 within the next 7 days Follow-up in approximately 4 weeks sooner problems Some mild pedal edema related to her underlying condition but do not feel the patient needs to be on a diuretic currently Blood pressure readings are good

## 2018-03-01 ENCOUNTER — Telehealth: Payer: Self-pay | Admitting: Family Medicine

## 2018-03-01 ENCOUNTER — Encounter: Payer: Self-pay | Admitting: Family Medicine

## 2018-03-01 DIAGNOSIS — M1711 Unilateral primary osteoarthritis, right knee: Secondary | ICD-10-CM | POA: Diagnosis not present

## 2018-03-01 DIAGNOSIS — M25561 Pain in right knee: Secondary | ICD-10-CM | POA: Diagnosis not present

## 2018-03-01 DIAGNOSIS — M25461 Effusion, right knee: Secondary | ICD-10-CM | POA: Diagnosis not present

## 2018-03-01 DIAGNOSIS — N362 Urethral caruncle: Secondary | ICD-10-CM

## 2018-03-01 NOTE — Addendum Note (Signed)
Addended by: Len Blalock on: 03/01/2018 04:11 PM   Modules accepted: Orders

## 2018-03-01 NOTE — Telephone Encounter (Signed)
[facility] and informed them that provider would like to speak with Dr.Patrick McKenzie. Gave provider cell number.

## 2018-03-01 NOTE — Telephone Encounter (Signed)
[email or web address] We share a mutual patient-[name].  She recently had hematuria which required further work-up. Recently she was diagnosed with left ureter tumor which required surgery by Dr. Rockwell [facility] urology.  This biopsy showed high-grade urethral carcinoma.  Creatinine was significantly elevated as a result of this urethral growth/blockage.  I will be rechecking her kidney functions later this week and hope to see improvement.  Dr. Alyson Ingles is considering surgery for this patient.  He is uncertain what procedure he will do.  He will be seeing her back on January 8.  He is concerned about the increased pulmonary nodule growth in her right lower base.  She did have a CT scan toward the end of November that is in the epic system.  He is requesting that a PET scan be ordered to know whether or not this is a separate tumor or cancer in her lung.  Myself and our office are trying to help coordinate some of her care.  In regards to the PET scan would you like for our office to help arrange for this?  Or do you feel that she would be best served having your office help coordinate this or oncology consultation.  Thank you for your input.  Feel free to send a reply to me or you can call me on my cell number [phone] Thank you- Sallee Lange MD family medicine

## 2018-03-01 NOTE — Telephone Encounter (Signed)
Hi, Thanks for the note We will order the PET CT Thanks for keeping me in the loop. Lauren  [name], Please order PET CT for cavitary nodule RLL Thanks, B

## 2018-03-01 NOTE — Telephone Encounter (Signed)
Let me speak with Dr. Nicolette Bang  [facility] Regarding this patient's recent surgical procedure and biopsy It would be fine to give them my cell number if necessary for him to reach me

## 2018-03-01 NOTE — Telephone Encounter (Signed)
PET ordered. 

## 2018-03-02 NOTE — Telephone Encounter (Signed)
I did discuss her case with her urologist Dr. Alyson Ingles We also communicated with her pulmonologist Dr. Lake Bells I did discuss with the patient regarding her symptomatology She relates she is having some urinary frequency but no blood in the urine it is not cloudy and no fever she will give [place] an update on Monday  I did inform her that her biopsy did show a cancer but hopefully this can be treated but will have procedure to be done for the treatment is not known until she goes through having a PET scan.  She understands all this and will proceed forward with PET scan as ordered by Dr. Lake Bells.  She will meet with Dr. Alyson Ingles in January to discuss surgical options.  She will give [place] an update this coming Monday.

## 2018-03-03 DIAGNOSIS — I10 Essential (primary) hypertension: Secondary | ICD-10-CM | POA: Diagnosis not present

## 2018-03-03 DIAGNOSIS — R7989 Other specified abnormal findings of blood chemistry: Secondary | ICD-10-CM | POA: Diagnosis not present

## 2018-03-04 LAB — BASIC METABOLIC PANEL
BUN/Creatinine Ratio: 17 (ref 12–28)
BUN: 36 mg/dL — AB (ref 8–27)
CHLORIDE: 105 mmol/L (ref 96–106)
CO2: 21 mmol/L (ref 20–29)
Calcium: 10.1 mg/dL (ref 8.7–10.3)
Creatinine, Ser: 2.09 mg/dL — ABNORMAL HIGH (ref 0.57–1.00)
GFR calc Af Amer: 24 mL/min/{1.73_m2} — ABNORMAL LOW (ref >59)
GFR calc non Af Amer: 21 mL/min/{1.73_m2} — ABNORMAL LOW (ref >59)
GLUCOSE: 77 mg/dL (ref 65–99)
Potassium: 4.3 mmol/L (ref 3.5–5.2)
Sodium: 143 mmol/L (ref 134–144)

## 2018-03-06 ENCOUNTER — Telehealth: Payer: Self-pay | Admitting: Family Medicine

## 2018-03-06 ENCOUNTER — Other Ambulatory Visit: Payer: Self-pay | Admitting: *Deleted

## 2018-03-06 ENCOUNTER — Encounter: Payer: Self-pay | Admitting: *Deleted

## 2018-03-06 DIAGNOSIS — R7989 Other specified abnormal findings of blood chemistry: Secondary | ICD-10-CM

## 2018-03-06 NOTE — Telephone Encounter (Signed)
Discussed with pt. See result note.

## 2018-03-06 NOTE — Telephone Encounter (Signed)
Pt would like [facility] nurse to give her a phone call to go over most recent blood work results.

## 2018-03-09 DIAGNOSIS — M25561 Pain in right knee: Secondary | ICD-10-CM | POA: Diagnosis not present

## 2018-03-09 DIAGNOSIS — M1711 Unilateral primary osteoarthritis, right knee: Secondary | ICD-10-CM | POA: Diagnosis not present

## 2018-03-13 ENCOUNTER — Telehealth: Payer: Self-pay | Admitting: Pulmonary Disease

## 2018-03-13 NOTE — Telephone Encounter (Signed)
I am sorry she is not feeling well. Her symptoms sound more serious. She has not been seen in the office since 12/2017. We will be happy to see her tomorrow morning.Thanks

## 2018-03-13 NOTE — Telephone Encounter (Signed)
Called and spoke with patient, she stated that since Friday afternoon she has had a deep cough with thick clear mucus production. Patient has consistent sneezing and complains of pain under the right breast area that radiates to the side. Patient is severely weak and fatigue. Patient denies fever, no body aches or chills. Patient stated she was given prednisone the last time this happened. Please advise, thank you.

## 2018-03-13 NOTE — Telephone Encounter (Signed)
Called and spoke with Patient. Lauren Form, NP recommendations given.  Patient stated that she can not come in to the office tomorrow, but can come in on Monday.  Patient request to see SG only.  Appointment made 03/20/18, 1045.  Patient instructed if symptoms get worse, to call our office or go to urgent care, ED.  Understanding stated.  Nothing further at this time.

## 2018-03-13 NOTE — Telephone Encounter (Signed)
I called the patient on the cell number provided.There was no answer. The mailbox was full,and I was unable to leave a message. Please call the patient. She needs to be scheduled with me next week to go over the scan results in the office. Thanks so much.

## 2018-03-13 NOTE — Telephone Encounter (Signed)
Called and spoke with patient, advised her of response below. Patient stated that she has a lot going on right now and she is unable to get in for a visit she would like something to be called in over the phone. SG please advise, thank you.

## 2018-03-20 ENCOUNTER — Ambulatory Visit (INDEPENDENT_AMBULATORY_CARE_PROVIDER_SITE_OTHER): Payer: Medicare Other | Admitting: Acute Care

## 2018-03-20 ENCOUNTER — Other Ambulatory Visit (HOSPITAL_COMMUNITY): Payer: Medicare Other

## 2018-03-20 ENCOUNTER — Encounter: Payer: Self-pay | Admitting: Acute Care

## 2018-03-20 DIAGNOSIS — I10 Essential (primary) hypertension: Secondary | ICD-10-CM | POA: Diagnosis not present

## 2018-03-20 DIAGNOSIS — C679 Malignant neoplasm of bladder, unspecified: Secondary | ICD-10-CM

## 2018-03-20 DIAGNOSIS — J440 Chronic obstructive pulmonary disease with acute lower respiratory infection: Secondary | ICD-10-CM

## 2018-03-20 DIAGNOSIS — J209 Acute bronchitis, unspecified: Secondary | ICD-10-CM | POA: Diagnosis not present

## 2018-03-20 MED ORDER — PREDNISONE 10 MG PO TABS: ORAL_TABLET | ORAL | 0 refills | Status: DC

## 2018-03-20 NOTE — Assessment & Plan Note (Signed)
Flare Clear secretions No fever Plan We will prescribe a prednisone taper today. Prednisone taper; 10 mg tablets: 4 tabs x 2 days, 3 tabs x 2 days, 2 tabs x 2 days 1 tab x 2 days then stop. Use Mucinex 1200 mg once daily with a full glass of water. Flonase 1 - 2 sprays in each nostril daily ( get generic) Claritin 10 mg once daily for allergies) Sips of water instead of throat clearing Sugar Free [employer] or [employer] originals for throat soothing. Delsym Cough syrup 5 cc's every 12 hours as needed for cough Non-sedating antihistamine of your choice daily ( Zyrtec, Allegra, Xyzol, Claritin ( Generic ok) Call if you need an additional prednisone taper. Call if secretions start to change color. Follow up with Dr. Wolfgang Phoenix about your blood pressure. Please contact office for sooner follow up if symptoms do not improve or worsen or seek emergency care  Follow up with Dr.McQuaid in April 2020>> Please make appointment today at check out.

## 2018-03-20 NOTE — Progress Notes (Signed)
History of Present Illness [name] is a 85 y.o. female former smoker with COPD .( Quit 1990 with a 100 pack year smoking history) She is followed by Dr. Lake Bells.  Synopsis  COPD, history of breast cancer; Referred in 2016 for chronic cough. Cough had resolved by the time she saw me. She been followed by [facility] for chronic cough for many years prior felt to be related to sinus congestion. She also had pulmonary nodules and some pleural calcification which was stable on chest imaging through 2012 there. After I saw her we did simple spirometry testing which showed clear airflow obstruction with an FEV1 of 48% predicted. However, the FVC was low at 1.38 L (54% predicted).  She smoked 2 packs a day for many years.  She was diagnosed with breast cancer in March 2017 and treated with lumpectomy, radiation therapy, and Arimidex./ She has recently been diagnosed with bladder cancer ( 02/12/2018) . She has an appointment 03/2018 to determine treatment plan.  03/20/2018  Acute OV for deep cough with thick clear mucus production. She has had symptoms x 1 week.  Patient has consistent sneezing and complains of pain under the right breast area that radiates to the side. Patient is very  weak and fatigued. Patient denies fever, no body aches or chills.She has had a new diagnosis of bladder cancer. She had noticed some blood in her urine.She has a PET scan ordered for 04/03/2018.She is very anxious in the office today. Her BP is elevated. She is having this worked up by her PCP, Dr. Wolfgang Phoenix. She has had a stent placed in her left kidney, and she does not know when it will be removed. She is tearful. She states she has developed a tremor after she was treated with a sulpa medication prior to her bladder procedure.We discussed that this may be related to her anxiety over her recent diagnosis.She states she has had these cold symptoms with cough , and would like some prednisone to treat it. She states  prednisone has worked in the past for these symptoms. She denies  Fever, or  purulent  Secretions. The secretions she does have  are thick and clear,in addition to having a  runny nose.Nasal secretions are also clear.  Test Results:  CBC Latest Ref Rng & Units 02/06/2018 12/21/2017 09/22/2017  WBC 3.4 - 10.8 x10E3/uL 9.4 6.7 6.6  Hemoglobin 11.1 - 15.9 g/dL 10.1(L) 11.5 11.6(L)  Hematocrit 34.0 - 46.6 % 30.8(L) 33.7(L) 36.4  Platelets 150 - 450 x10E3/uL 267 242 231    BMP Latest Ref Rng & Units 03/03/2018 02/20/2018 02/06/2018  Glucose 65 - 99 mg/dL 77 121(H) 103(H)  BUN 8 - 27 mg/dL 36(H) 36(H) 47(H)  Creatinine 0.57 - 1.00 mg/dL 2.09(H) 2.42(H) 2.77(H)  BUN/Creat Ratio 12 - 28 17 15 17   Sodium 134 - 144 mmol/L 143 141 142  Potassium 3.5 - 5.2 mmol/L 4.3 4.4 4.9  Chloride 96 - 106 mmol/L 105 101 103  CO2 20 - 29 mmol/L 21 24 22   Calcium 8.7 - 10.3 mg/dL 10.1 9.6 10.0    BNP No results found for: BNP  ProBNP    Component Value Date/Time   PROBNP 195 06/29/2016 1540    PFT    Component Value Date/Time   FEV1PRE 1.19 08/13/2016 1319   FEV1POST 1.25 08/13/2016 1319   FVCPRE 1.78 08/13/2016 1319   FVCPOST 1.86 08/13/2016 1319   TLC 4.66 08/13/2016 1319   DLCOUNC 13.10 08/13/2016 1319  PREFEV1FVCRT 67 08/13/2016 1319   PSTFEV1FVCRT 67 08/13/2016 1319    Dg C-arm 1-60 Min-no Report  Result Date: 02/27/2018 Fluoroscopy was utilized by the requesting physician.  No radiographic interpretation.     Past medical hx Past Medical History:  Diagnosis Date  . Breast cancer ([place])   . Breast cancer, right breast ([place]) 05/01/2015  . Cancer ([facility])    BREAST CANCER   . Colon polyps   . COPD (chronic obstructive pulmonary disease) ([place])    DR. Lake Bells  . Diabetes mellitus without complication ([place])    PREDIABETIC  CONTROL W/ DIET  . Hypercholesteremia   . Hypertension   . Lung nodules   . Osteoarthritis   . Osteopenia 09/18/2013  . Osteoporosis   . Personal history of  radiation therapy   . PONV (postoperative nausea and vomiting)   . Shortness of breath dyspnea    W/ EXERTION     Social History   Tobacco Use  . Smoking status: Former Smoker    Packs/day: 2.00    Years: 50.00    Pack years: 100.00    Types: Cigarettes    Last attempt to quit: 03/15/1988    Years since quitting: 30.0  . Smokeless tobacco: Never Used  Substance Use Topics  . Alcohol use: No    Alcohol/week: 0.0 standard drinks  . Drug use: No    [name] reports that she quit smoking about 30 years ago. Her smoking use included cigarettes. She has a 100.00 pack-year smoking history. She has never used smokeless tobacco. She reports that she does not drink alcohol or use drugs.  Tobacco Cessation: Former smoker quit 1990 with a 100 pack year smoking history  Past surgical hx, Family hx, Social hx all reviewed.  Current Outpatient Medications on File Prior to Visit  Medication Sig  . amLODipine (NORVASC) 2.5 MG tablet Take 1 tablet (2.5 mg total) by mouth daily.  Marland Kitchen anastrozole (ARIMIDEX) 1 MG tablet Take 1 tablet (1 mg total) by mouth daily. (Patient taking differently: Take 1 mg by mouth every evening. )  . cefUROXime (CEFTIN) 250 MG tablet Take 250 mg by mouth 2 (two) times daily.  . fluticasone (FLONASE) 50 MCG/ACT nasal spray Place 2 sprays into both nostrils daily. Sometimes uses a second dose qhs  . nortriptyline (PAMELOR) 10 MG capsule Take 3 capsules (30 mg total) by mouth at bedtime. Take 2-3 at night (Patient taking differently: Take 30 mg by mouth See admin instructions. Take 2-3 capsules (20-30 mg) by mouth at bedtime (dosage varies based on pain level))  . polyethylene glycol (MIRALAX / GLYCOLAX) packet Take 17 g by mouth daily as needed (constipation.).   Marland Kitchen PROAIR RESPICLICK 034 (90 Base) MCG/ACT AEPB INHALE 1 PUFF INTO THE LUNGS EVERY 4 HOURS AS NEEDED. (Patient taking differently: Inhale 1 puff into the lungs every 4 (four) hours as needed (wheezing/shortness of  breath.). )  . STIOLTO RESPIMAT 2.5-2.5 MCG/ACT AERS INHALE 2 PUFFS INTO THE LUNGS ONCE DAILY. (Patient taking differently: Inhale 2 puffs into the lungs daily. )  . traMADol (ULTRAM) 50 MG tablet Take 1 tablet (50 mg total) by mouth every 6 (six) hours as needed for moderate pain.   No current facility-administered medications on file prior to visit.      Allergies  Allergen Reactions  . Augmentin [Amoxicillin-Pot Clavulanate] Nausea And Vomiting    Has patient had a PCN reaction causing immediate rash, facial/tongue/throat swelling, SOB or lightheadedness with hypotension: No Has patient had a  PCN reaction causing severe rash involving mucus membranes or skin necrosis: No Has patient had a PCN reaction that required hospitalization: No Has patient had a PCN reaction occurring within the last 10 years: Unknown If all of the above answers are "NO", then may proceed with Cephalosporin use.    Marland Kitchen Amoxicillin Diarrhea    Has patient had a PCN reaction causing immediate rash, facial/tongue/throat swelling, SOB or lightheadedness with hypotension: No Has patient had a PCN reaction causing severe rash involving mucus membranes or skin necrosis: No Has patient had a PCN reaction that required hospitalization: No Has patient had a PCN reaction occurring within the last 10 years: Unknown If all of the above answers are "NO", then may proceed with Cephalosporin use.   . Codeine Nausea And Vomiting  . Morphine Nausea And Vomiting  . Neomycin-Bacitracin Zn-Polymyx Itching and Rash    Unable to use Neosporin    Review Of Systems:  Constitutional:   No  weight loss, night sweats,  Fevers, chills, fatigue, or  lassitude.  HEENT:   No headaches,  Difficulty swallowing,  Tooth/dental problems, or  Sore throat,                No sneezing, itching, ear ache, + nasal congestion, + post nasal drip,   CV:  No chest pain,  Orthopnea, PND, swelling in lower extremities, anasarca, dizziness, palpitations,  syncope.   GI  No heartburn, indigestion, abdominal pain, nausea, vomiting, diarrhea, change in bowel habits, loss of appetite, bloody stools.   Resp: + shortness of breath with exertion none at rest.  + excess mucus, + productive cough,  No non-productive cough,  No coughing up of blood.  No change in color of mucus.  Occasional wheezing.  No chest wall deformity  Skin: no rash or lesions.  GU: no dysuria, change in color of urine, no urgency or frequency.  No flank pain, no hematuria   MS:  No joint pain or swelling.  No decreased range of motion.  No back pain.  Psych:  + change in mood or affect. ( Anxious) No depression or + anxiety since bladder cancer diagnosis.  No memory loss.   Vital Signs BP (!) 168/80 (BP Location: Left Arm, Cuff Size: Normal)   Pulse (!) 104   Temp 97.8 F (36.6 C) (Oral)   Ht 5' 2.56" (1.589 m)   Wt 158 lb 6.4 oz (71.8 kg)   SpO2 97%   BMI 28.46 kg/m    Physical Exam:  General- No distress,  A&Ox3, pleasant, tearful in the office today ENT: No sinus tenderness, TM clear, pale nasal mucosa, no oral exudate,no post nasal drip, no LAN Cardiac: S1, S2, regular rate and rhythm, no murmur Chest: No wheeze/ rales/ dullness; no accessory muscle use, no nasal flaring, no sternal retractions, diminished per bases Abd.: Soft Non-tender, ND, BS +, Body mass index is 28.46 kg/m. Ext: No clubbing cyanosis, edema, no obvious deformities Neuro:  normal strength, MAE x 4, A&O x 3, tremor to UE ( New) Skin: No rashes, warm and dry Psych: tearful, anxious about her new bladder cancer diagnosis   Assessment/Plan  Acute bronchitis with COPD (HCC) Flare Clear secretions No fever Plan We will prescribe a prednisone taper today. Prednisone taper; 10 mg tablets: 4 tabs x 2 days, 3 tabs x 2 days, 2 tabs x 2 days 1 tab x 2 days then stop. Use Mucinex 1200 mg once daily with a full glass of water. Flonase 1 -  2 sprays in each nostril daily ( get  generic) Claritin 10 mg once daily for allergies) Sips of water instead of throat clearing Sugar Free [employer] or [employer] originals for throat soothing. Delsym Cough syrup 5 cc's every 12 hours as needed for cough Non-sedating antihistamine of your choice daily ( Zyrtec, Allegra, Xyzol, Claritin ( Generic ok) Call if you need an additional prednisone taper. Call if secretions start to change color. Follow up with Dr. Wolfgang Phoenix about your blood pressure. Please contact office for sooner follow up if symptoms do not improve or worsen or seek emergency care  Follow up with Dr.McQuaid in April 2020>> Please make appointment today at check out.   Essential hypertension, benign BP elevated in the office today This is being worked up by PCP She has significant anxiety 2/2 her recent bladder cancer diagnosis Plan Follow up with Dr. Wolfgang Phoenix re; HTN as you have been doing  Bladder cancer [facility]) New diagnosis of Bladder Cancer 02/27/2018 Plan: PET scan 04/03/2018 Follow up with Dr. Alyson Ingles urology for plan of care Follow up with Dr. Wolfgang Phoenix PCP    Magdalen Spatz, NP 03/20/2018  2:35 PM

## 2018-03-20 NOTE — Patient Instructions (Addendum)
It is good to see you today. We will prescribe a prednisone taper today. Prednisone taper; 10 mg tablets: 4 tabs x 2 days, 3 tabs x 2 days, 2 tabs x 2 days 1 tab x 2 days then stop. Use Mucinex 1200 mg once daily with a full glass of water. Flonase 1 - 2 sprays in each nostril daily ( get generic) Claritin 10 mg once daily for allergies) Sips of water instead of throat clearing Sugar Free [employer] or [employer] originals for throat soothing. Delsym Cough syrup 5 cc's every 12 hours as needed for cough Non-sedating antihistamine of your choice daily ( Zyrtec, Allegra, Xyzol, Claritin ( Generic ok) Call if you need an additional prednisone taper. Call if secretions start to change color. Follow up with Dr. Wolfgang Phoenix about your blood pressure. Please contact office for sooner follow up if symptoms do not improve or worsen or seek emergency care  Follow up with Dr.McQuaid in April 2020>> Please make appointment today at check out.

## 2018-03-20 NOTE — Assessment & Plan Note (Signed)
BP elevated in the office today This is being worked up by PCP She has significant anxiety 2/2 her recent bladder cancer diagnosis Plan Follow up with Dr. Wolfgang Phoenix re; HTN as you have been doing

## 2018-03-20 NOTE — Assessment & Plan Note (Signed)
New diagnosis of Bladder Cancer 02/27/2018 Plan: PET scan 04/03/2018 Follow up with Dr. Alyson Ingles urology for plan of care Follow up with Dr. Wolfgang Phoenix PCP

## 2018-03-21 DIAGNOSIS — M25561 Pain in right knee: Secondary | ICD-10-CM | POA: Diagnosis not present

## 2018-03-21 DIAGNOSIS — M1711 Unilateral primary osteoarthritis, right knee: Secondary | ICD-10-CM | POA: Diagnosis not present

## 2018-03-21 NOTE — Progress Notes (Signed)
Reviewed, agree 

## 2018-03-22 ENCOUNTER — Telehealth: Payer: Self-pay | Admitting: Family Medicine

## 2018-03-22 ENCOUNTER — Ambulatory Visit (INDEPENDENT_AMBULATORY_CARE_PROVIDER_SITE_OTHER): Payer: Medicare Other | Admitting: Urology

## 2018-03-22 DIAGNOSIS — C669 Malignant neoplasm of unspecified ureter: Secondary | ICD-10-CM

## 2018-03-22 NOTE — Telephone Encounter (Signed)
If the patient desires a face-to-face discussion about this I can see her tomorrow midmorning If she prefers a phone call please let me know that Typically my phone calls are brief and if it is more of a longer discussion it is better to do it face-to-face  Whenever the patient prefers

## 2018-03-22 NOTE — Telephone Encounter (Signed)
°  Patient wanting to speak with you about her visits to specialists on 1/6 with Dr.Grose and she seen Dr. Alyson Ingles today. She states she was told at her last visit with you that you would work her in at any time that she needed you.

## 2018-03-22 NOTE — Telephone Encounter (Signed)
Patient is aware and was transferred up front to set up an appt.

## 2018-03-23 ENCOUNTER — Ambulatory Visit (INDEPENDENT_AMBULATORY_CARE_PROVIDER_SITE_OTHER): Payer: Medicare Other | Admitting: Family Medicine

## 2018-03-23 ENCOUNTER — Encounter: Payer: Self-pay | Admitting: Family Medicine

## 2018-03-23 VITALS — BP 144/86 | Temp 97.5°F | Ht 62.0 in | Wt 161.0 lb

## 2018-03-23 DIAGNOSIS — J984 Other disorders of lung: Secondary | ICD-10-CM

## 2018-03-23 DIAGNOSIS — I10 Essential (primary) hypertension: Secondary | ICD-10-CM

## 2018-03-23 DIAGNOSIS — C662 Malignant neoplasm of left ureter: Secondary | ICD-10-CM | POA: Diagnosis not present

## 2018-03-23 MED ORDER — AMLODIPINE BESYLATE 5 MG PO TABS: 5.0000 mg | ORAL_TABLET | Freq: Every day | ORAL | 2 refills | Status: DC

## 2018-03-23 NOTE — Progress Notes (Signed)
Subjective:    Patient ID: [name], female    DOB: 1928-10-23, 85 y.o.   MRN: [id number]  HPI  Patient is here today to go over her regarding her specialist visits with Dr. Johney Maine and Dr. Alyson Ingles. She states Dr. Alyson Ingles removed her tube yesterday.  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  This makes it into a more of a complicated issue.  She has a PET scan coming up to help determine if this is potentially cancerous and would help guide what approach would be best for this patient.  She does have a pulmonologist who works with her.  She also sees hematology on a regular basis.  It should also be noted that she had acute renal failure at the time of this obstruction but her numbers are starting to improve.  She is due for repeat on her blood work next week  She also has high blood pressure her blood pressure was actually quite low when we stopped her medicines recently and has been starting to go back she denies any chest tightness pressure pain or shortness of breath She states she has a "buch" of things she needs to discuss with you. She would not go into further detail with me. Review of Systems  Constitutional: Negative for activity change, appetite change and fatigue.  HENT: Negative for congestion and rhinorrhea.   Respiratory: Negative for cough and shortness of breath.   Cardiovascular: Negative for chest pain and leg swelling.  Gastrointestinal: Negative for abdominal pain and diarrhea.  Endocrine: Negative for polydipsia and polyphagia.  Skin: Negative for color change.  Neurological: Negative for dizziness and weakness.  Psychiatric/Behavioral: Negative for behavioral problems and confusion.       Objective:   Physical Exam Vitals signs reviewed.  Constitutional:      General: She is not in  acute distress. HENT:     Head: Normocephalic and atraumatic.  Eyes:     General:        Right eye: No discharge.        Left eye: No discharge.  Neck:     Trachea: No tracheal deviation.  Cardiovascular:     Rate and Rhythm: Normal rate and regular rhythm.     Heart sounds: Normal heart sounds. No murmur.  Pulmonary:     Effort: Pulmonary effort is normal. No respiratory distress.     Breath sounds: Normal breath sounds.  Lymphadenopathy:     Cervical: No cervical adenopathy.  Skin:    General: Skin is warm and dry.  Neurological:     Mental Status: She is alert.     Coordination: Coordination normal.  Psychiatric:        Behavior: Behavior normal.           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  She states she read on the [facility] that amlodipine can cause lung cancer.  I told her I have never heard of this and did a brief search and did not find evidence of this.  I recommend that she increase her amlodipine-new dose 5 mg she will follow-up at the end of this month to discuss how things are going.  Patient very stressed by all of this.  She will do her metabolic 7 in approximately a week because of acute renal dysfunction related to ureter obstruction  Patient is very stressed by all of this and rightfully so we had a long discussion about how the PET scan will help guide things.  She already has an appointment with hematology oncology set up 2 days after the PET scan which will be  fortuitous.  Hopefully PET scan can help guide what would be the best approach for urology and pulmonology to take regarding these areas  25 minutes was spent with the patient.  This statement verifies that 25 minutes was indeed spent with the patient.  More than 50% of this visit-total duration of the visit-was spent in counseling and coordination of care. The issues that the patient came in for today as reflected in the diagnosis (s) please refer to documentation for further details.

## 2018-03-24 ENCOUNTER — Other Ambulatory Visit: Payer: Self-pay | Admitting: Pulmonary Disease

## 2018-03-28 ENCOUNTER — Other Ambulatory Visit (HOSPITAL_COMMUNITY)
Admission: RE | Admit: 2018-03-28 | Discharge: 2018-03-28 | Disposition: A | Discharge status: Home / Self Care | Payer: Medicare Other | Source: Ambulatory Visit | Attending: *Deleted | Admitting: *Deleted

## 2018-03-28 ENCOUNTER — Ambulatory Visit (HOSPITAL_COMMUNITY)
Admission: RE | Admit: 2018-03-28 | Discharge: 2018-03-28 | Disposition: A | Discharge status: Home / Self Care | Payer: Medicare Other | Source: Ambulatory Visit | Attending: Family Medicine | Admitting: Family Medicine

## 2018-03-28 ENCOUNTER — Ambulatory Visit (INDEPENDENT_AMBULATORY_CARE_PROVIDER_SITE_OTHER): Payer: Medicare Other | Admitting: Family Medicine

## 2018-03-28 VITALS — BP 154/86

## 2018-03-28 DIAGNOSIS — R600 Localized edema: Secondary | ICD-10-CM

## 2018-03-28 DIAGNOSIS — I10 Essential (primary) hypertension: Secondary | ICD-10-CM | POA: Diagnosis not present

## 2018-03-28 DIAGNOSIS — R0602 Shortness of breath: Secondary | ICD-10-CM | POA: Diagnosis not present

## 2018-03-28 DIAGNOSIS — M25561 Pain in right knee: Secondary | ICD-10-CM | POA: Diagnosis not present

## 2018-03-28 DIAGNOSIS — R0609 Other forms of dyspnea: Secondary | ICD-10-CM | POA: Insufficient documentation

## 2018-03-28 DIAGNOSIS — M1711 Unilateral primary osteoarthritis, right knee: Secondary | ICD-10-CM | POA: Diagnosis not present

## 2018-03-28 LAB — BASIC METABOLIC PANEL
Anion gap: 9 (ref 5–15)
BUN: 37 mg/dL — ABNORMAL HIGH (ref 8–23)
CHLORIDE: 104 mmol/L (ref 98–111)
CO2: 24 mmol/L (ref 22–32)
Calcium: 9.3 mg/dL (ref 8.9–10.3)
Creatinine, Ser: 1.86 mg/dL — ABNORMAL HIGH (ref 0.44–1.00)
GFR calc non Af Amer: 24 mL/min — ABNORMAL LOW (ref >60)
GFR, EST AFRICAN AMERICAN: 28 mL/min — AB (ref >60)
Glucose, Bld: 127 mg/dL — ABNORMAL HIGH (ref 70–99)
Potassium: 4.1 mmol/L (ref 3.5–5.1)
Sodium: 137 mmol/L (ref 135–145)

## 2018-03-28 LAB — BRAIN NATRIURETIC PEPTIDE: B Natriuretic Peptide: 181 pg/mL — ABNORMAL HIGH (ref 0.0–100.0)

## 2018-03-28 MED ORDER — FUROSEMIDE 20 MG PO TABS: 20.0000 mg | ORAL_TABLET | Freq: Every day | ORAL | 3 refills | Status: DC

## 2018-03-28 NOTE — Progress Notes (Signed)
   Subjective:    Patient ID: [name], female    DOB: [birth year], 85 y.o.   MRN: [id number]  HPI  Patient arrives she has noticed swelling in her legs and SOB since Wednesday. Patient states she has not had her fluid medication and her norvasc was increased at last visit. Patient was recently started on amlodipine Since then with some swelling in the lower legs She also relates feeling heavy in her legs and gets a little out of breath with activity but denies PND denies orthopnea no chest tightness pressure pain PMH benign she also has upcoming PET scan as well as evaluation for urethral lesion as well as lung lesion plus also visit with oncology coming up and also acute renal insufficiency/kidney disease related to probable ureteral obstruction but possibly also related to recent use of ACE inhibitor is no longer on ACE inhibitor Review of Systems  Constitutional: Negative for activity change, appetite change and fatigue.  HENT: Negative for congestion and rhinorrhea.   Respiratory: Negative for cough and shortness of breath.   Cardiovascular: Positive for leg swelling. Negative for chest pain.  Gastrointestinal: Negative for abdominal pain and diarrhea.  Endocrine: Negative for polydipsia and polyphagia.  Skin: Negative for color change.  Neurological: Negative for dizziness and weakness.  Psychiatric/Behavioral: Negative for behavioral problems and confusion.       Objective:   Physical Exam Vitals signs reviewed.  Constitutional:      General: She is not in acute distress. HENT:     Head: Normocephalic and atraumatic.  Eyes:     General:        Right eye: No discharge.        Left eye: No discharge.  Neck:     Trachea: No tracheal deviation.  Cardiovascular:     Rate and Rhythm: Normal rate and regular rhythm.     Heart sounds: Normal heart sounds. No murmur.  Pulmonary:     Effort: Pulmonary effort is normal. No respiratory distress.     Breath sounds: Normal  breath sounds.  Lymphadenopathy:     Cervical: No cervical adenopathy.  Skin:    General: Skin is warm and dry.  Neurological:     Mental Status: She is alert.     Coordination: Coordination normal.  Psychiatric:        Behavior: Behavior normal.    She has 1+ pitting edema in the lower legs.   25 minutes was spent with the patient.  This statement verifies that 25 minutes was indeed spent with the patient.  More than 50% of this visit-total duration of the visit-was spent in counseling and coordination of care. The issues that the patient came in for today as reflected in the diagnosis (s) please refer to documentation for further details.     Assessment & Plan:  Pedal edema more than likely related to her amlodipine but because she is having shortness of breath we need to look for CHF therefore chest x-ray as well as  Chest x-ray did not show any CHF did not show any pulmonary edema She does have the pedal edema Her metabolic 7 shows an improvement of the creatinine BNP is still pending We will add furosemide 20 mg each morning to this We will recheck the patient on Friday She will follow-up sooner if any problems Warning signs were discussed

## 2018-03-29 ENCOUNTER — Other Ambulatory Visit (HOSPITAL_COMMUNITY): Payer: Medicare Other

## 2018-03-31 ENCOUNTER — Ambulatory Visit (INDEPENDENT_AMBULATORY_CARE_PROVIDER_SITE_OTHER): Payer: Medicare Other | Admitting: Family Medicine

## 2018-03-31 ENCOUNTER — Encounter: Payer: Self-pay | Admitting: Family Medicine

## 2018-03-31 VITALS — BP 142/68 | HR 96 | Temp 98.0°F | Ht 62.0 in | Wt 161.0 lb

## 2018-03-31 DIAGNOSIS — I10 Essential (primary) hypertension: Secondary | ICD-10-CM

## 2018-03-31 DIAGNOSIS — R600 Localized edema: Secondary | ICD-10-CM

## 2018-03-31 NOTE — Progress Notes (Signed)
   Subjective:    Patient ID: [name], female    DOB: 20-May-1928, 85 y.o.   MRN: [id number]  HPI  Patient is here today to follow up on her edema.She states she still having a problem with the fluid. On examination she does have some pitting edema.  She is taking Lasix 20 mg one po Qd, and she says her Amlodipine was increased to 5 mg per day.She does report having some shortness of breath whether sitting or upon activity. She has been using O2 two liters and states that helps with the windedness. She states she has an O2 tank at home and if she needed it she would like a portable one,but says her o2 levels are not low enough.  She presents today here more so for follow-up of checking her blood pressure the swelling in her legs and reviewing over the test results She also received notice of referral to nephrology and she was concerned why that was being made She does have a history of Copd.O2 here in the office today was 99%.  Review of Systems  Constitutional: Negative for activity change, fatigue and fever.  HENT: Negative for congestion and rhinorrhea.   Respiratory: Negative for cough, chest tightness and shortness of breath.   Cardiovascular: Positive for leg swelling. Negative for chest pain.  Gastrointestinal: Negative for abdominal pain and nausea.  Skin: Negative for color change.  Neurological: Negative for dizziness and headaches.  Psychiatric/Behavioral: Negative for agitation and behavioral problems.       Objective:   Physical Exam Vitals signs reviewed.  Constitutional:      General: She is not in acute distress. HENT:     Head: Normocephalic and atraumatic.  Eyes:     General:        Right eye: No discharge.        Left eye: No discharge.  Neck:     Trachea: No tracheal deviation.  Cardiovascular:     Rate and Rhythm: Normal rate and regular rhythm.     Heart sounds: Normal heart sounds. No murmur.  Pulmonary:     Effort: Pulmonary effort is  normal. No respiratory distress.     Breath sounds: Normal breath sounds.  Lymphadenopathy:     Cervical: No cervical adenopathy.  Skin:    General: Skin is warm and dry.  Neurological:     Mental Status: She is alert.     Coordination: Coordination normal.  Psychiatric:        Behavior: Behavior normal.     With the BNP being slightly above normal I do not feel that her swelling in her legs is a large component of CHF I believe is more related anterior kidney issue as well as side effect of amlodipine      Assessment & Plan:  Pedal edema probably associated with combination of kidney disease as well as amlodipine Continue the Lasix continue amlodipine Will be seen nephrology in early February We will see urology in the near future We will also be seeing oncology as well Has a PET scan coming up on the 20th and a follow-up office visit with [place] in 2 weeks Follow-up sooner problems I do not feel this patient needs oxygen  She also has been treated in the past with radiation for breast cancer at [facility]

## 2018-04-01 DIAGNOSIS — R7989 Other specified abnormal findings of blood chemistry: Secondary | ICD-10-CM | POA: Diagnosis not present

## 2018-04-02 ENCOUNTER — Telehealth: Payer: Self-pay | Admitting: Family Medicine

## 2018-04-02 LAB — BASIC METABOLIC PANEL
BUN/Creatinine Ratio: 16 (ref 12–28)
BUN: 35 mg/dL — ABNORMAL HIGH (ref 8–27)
CO2: 26 mmol/L (ref 20–29)
Calcium: 9.7 mg/dL (ref 8.7–10.3)
Chloride: 99 mmol/L (ref 96–106)
Creatinine, Ser: 2.25 mg/dL — ABNORMAL HIGH (ref 0.57–1.00)
GFR calc Af Amer: 22 mL/min/{1.73_m2} — ABNORMAL LOW (ref >59)
GFR calc non Af Amer: 19 mL/min/{1.73_m2} — ABNORMAL LOW (ref >59)
Glucose: 125 mg/dL — ABNORMAL HIGH (ref 65–99)
Potassium: 3.7 mmol/L (ref 3.5–5.2)
SODIUM: 141 mmol/L (ref 134–144)

## 2018-04-02 NOTE — Telephone Encounter (Signed)
Referral was made to [facility] back in December This patient has cancer of her ureter as well as the lung and is facing some potential surgeries  Her kidney function continues to get worse despite our best efforts  I am concerned about this patient Please see if [facility] could see her sooner Patient has multiple other appointments coming up with PET scan cancer doctor etc. Please see what [facility] can do about seeing her sooner thank you

## 2018-04-03 ENCOUNTER — Encounter (HOSPITAL_COMMUNITY)
Admission: RE | Admit: 2018-04-03 | Discharge: 2018-04-03 | Disposition: A | Discharge status: Home / Self Care | Payer: Medicare Other | Source: Ambulatory Visit | Attending: Pulmonary Disease | Admitting: Pulmonary Disease

## 2018-04-03 ENCOUNTER — Telehealth: Payer: Self-pay | Admitting: *Deleted

## 2018-04-03 ENCOUNTER — Inpatient Hospital Stay (HOSPITAL_COMMUNITY): Payer: Medicare Other | Attending: Hematology

## 2018-04-03 ENCOUNTER — Ambulatory Visit (HOSPITAL_COMMUNITY): Payer: Medicare Other | Admitting: Hematology

## 2018-04-03 DIAGNOSIS — Z170 Estrogen receptor positive status [ER+]: Secondary | ICD-10-CM | POA: Diagnosis not present

## 2018-04-03 DIAGNOSIS — N289 Disorder of kidney and ureter, unspecified: Secondary | ICD-10-CM | POA: Diagnosis not present

## 2018-04-03 DIAGNOSIS — M85852 Other specified disorders of bone density and structure, left thigh: Secondary | ICD-10-CM | POA: Insufficient documentation

## 2018-04-03 DIAGNOSIS — D508 Other iron deficiency anemias: Secondary | ICD-10-CM | POA: Insufficient documentation

## 2018-04-03 DIAGNOSIS — Z79811 Long term (current) use of aromatase inhibitors: Secondary | ICD-10-CM | POA: Diagnosis not present

## 2018-04-03 DIAGNOSIS — R58 Hemorrhage, not elsewhere classified: Secondary | ICD-10-CM | POA: Diagnosis not present

## 2018-04-03 DIAGNOSIS — C680 Malignant neoplasm of urethra: Secondary | ICD-10-CM | POA: Diagnosis not present

## 2018-04-03 DIAGNOSIS — N951 Menopausal and female climacteric states: Secondary | ICD-10-CM | POA: Diagnosis not present

## 2018-04-03 DIAGNOSIS — N362 Urethral caruncle: Secondary | ICD-10-CM | POA: Diagnosis not present

## 2018-04-03 DIAGNOSIS — C50911 Malignant neoplasm of unspecified site of right female breast: Secondary | ICD-10-CM | POA: Diagnosis not present

## 2018-04-03 DIAGNOSIS — Z923 Personal history of irradiation: Secondary | ICD-10-CM | POA: Insufficient documentation

## 2018-04-03 LAB — CBC WITH DIFFERENTIAL/PLATELET
Abs Immature Granulocytes: 0.03 10*3/uL (ref 0.00–0.07)
Basophils Absolute: 0 10*3/uL (ref 0.0–0.1)
Basophils Relative: 0 %
Eosinophils Absolute: 0.1 10*3/uL (ref 0.0–0.5)
Eosinophils Relative: 2 %
HEMATOCRIT: 25.4 % — AB (ref 36.0–46.0)
Hemoglobin: 7.8 g/dL — ABNORMAL LOW (ref 12.0–15.0)
Immature Granulocytes: 0 %
Lymphocytes Relative: 17 %
Lymphs Abs: 1.5 10*3/uL (ref 0.7–4.0)
MCH: 30.7 pg (ref 26.0–34.0)
MCHC: 30.7 g/dL (ref 30.0–36.0)
MCV: 100 fL (ref 80.0–100.0)
Monocytes Absolute: 0.6 10*3/uL (ref 0.1–1.0)
Monocytes Relative: 7 %
NEUTROS PCT: 74 %
Neutro Abs: 6.3 10*3/uL (ref 1.7–7.7)
Platelets: 280 10*3/uL (ref 150–400)
RBC: 2.54 MIL/uL — ABNORMAL LOW (ref 3.87–5.11)
RDW: 13.1 % (ref 11.5–15.5)
WBC: 8.6 10*3/uL (ref 4.0–10.5)
nRBC: 0 % (ref 0.0–0.2)

## 2018-04-03 LAB — COMPREHENSIVE METABOLIC PANEL
ALBUMIN: 3.7 g/dL (ref 3.5–5.0)
ALT: 26 U/L (ref 0–44)
AST: 26 U/L (ref 15–41)
Alkaline Phosphatase: 59 U/L (ref 38–126)
Anion gap: 11 (ref 5–15)
BILIRUBIN TOTAL: 0.5 mg/dL (ref 0.3–1.2)
BUN: 34 mg/dL — AB (ref 8–23)
CO2: 25 mmol/L (ref 22–32)
Calcium: 9.2 mg/dL (ref 8.9–10.3)
Chloride: 102 mmol/L (ref 98–111)
Creatinine, Ser: 2.23 mg/dL — ABNORMAL HIGH (ref 0.44–1.00)
GFR calc Af Amer: 22 mL/min — ABNORMAL LOW (ref >60)
GFR, EST NON AFRICAN AMERICAN: 19 mL/min — AB (ref >60)
GLUCOSE: 107 mg/dL — AB (ref 70–99)
Potassium: 3.3 mmol/L — ABNORMAL LOW (ref 3.5–5.1)
Sodium: 138 mmol/L (ref 135–145)
Total Protein: 6.4 g/dL — ABNORMAL LOW (ref 6.5–8.1)

## 2018-04-03 MED ORDER — FLUDEOXYGLUCOSE F - 18 (FDG) INJECTION
10.2900 | Freq: Once | INTRAVENOUS | Status: AC | PRN
  Administered 2018-04-03: 10.29 via INTRAVENOUS

## 2018-04-03 NOTE — Telephone Encounter (Signed)
Pt notified results typically takes 24 -48 to get. Pt verbalized understanding.

## 2018-04-03 NOTE — Telephone Encounter (Signed)
Pt wanted to know if dr scott would review her pet scan results. She is going today at 2pm at [facility].

## 2018-04-03 NOTE — Telephone Encounter (Signed)
We will keep an eye out for this.  Typically PET scan results come back within 24 to 48 hours Please notify patient Also please tag this back to me as a reminder to follow-up on her PET scan thank you

## 2018-04-04 ENCOUNTER — Telehealth: Payer: Self-pay | Admitting: Family Medicine

## 2018-04-04 DIAGNOSIS — M1711 Unilateral primary osteoarthritis, right knee: Secondary | ICD-10-CM | POA: Diagnosis not present

## 2018-04-04 DIAGNOSIS — M25561 Pain in right knee: Secondary | ICD-10-CM | POA: Diagnosis not present

## 2018-04-04 NOTE — Telephone Encounter (Signed)
[facility] patient Currently patient has a ureter cancer being followed by Dr. Alyson Ingles [facility]  They are contemplating surgical resection  Her case is complicated by significant kidney disease/renal insufficiency which became dramatically worse around the time of this occurrence of cancer  Also complicated by enlarged finding on right lower lung  Patient had PET scan at the advisement of her pulmonologist-Dr. Lake Bells  As best I can tell PET scan does not show metastatic disease  She will be seeing you on the 22nd for further opinion regarding her situation  She will be seen by the urologist next week  Any input you have is most appreciated-thanks- Sallee Lange MD primary care

## 2018-04-04 NOTE — Telephone Encounter (Signed)
Results of pet scan are final in epic

## 2018-04-04 NOTE — Telephone Encounter (Signed)
Called [facility], pt is scheduled for 04/21/2018 @ 12:00pm w/Dr. Johnney Ou  If you feel patient needs to be seen sooner, they recommend you speak with one of their physicians  Ph# [phone] - [facility]

## 2018-04-04 NOTE — Telephone Encounter (Signed)
This Dr. Nicki Reaper looking into the I did discuss the results of this with the patient I sent oncology a message regarding this patient

## 2018-04-05 ENCOUNTER — Ambulatory Visit (HOSPITAL_COMMUNITY): Payer: Medicare Other | Admitting: Hematology

## 2018-04-05 ENCOUNTER — Encounter (HOSPITAL_COMMUNITY): Payer: Self-pay | Admitting: Hematology

## 2018-04-05 ENCOUNTER — Inpatient Hospital Stay (HOSPITAL_BASED_OUTPATIENT_CLINIC_OR_DEPARTMENT_OTHER): Payer: Medicare Other | Admitting: Hematology

## 2018-04-05 ENCOUNTER — Inpatient Hospital Stay (HOSPITAL_COMMUNITY): Payer: Medicare Other

## 2018-04-05 VITALS — BP 154/69 | HR 91 | Temp 98.1°F | Resp 18 | Wt 162.0 lb

## 2018-04-05 DIAGNOSIS — I739 Peripheral vascular disease, unspecified: Secondary | ICD-10-CM

## 2018-04-05 DIAGNOSIS — N289 Disorder of kidney and ureter, unspecified: Secondary | ICD-10-CM | POA: Diagnosis not present

## 2018-04-05 DIAGNOSIS — C50911 Malignant neoplasm of unspecified site of right female breast: Secondary | ICD-10-CM

## 2018-04-05 DIAGNOSIS — Z79811 Long term (current) use of aromatase inhibitors: Secondary | ICD-10-CM | POA: Diagnosis not present

## 2018-04-05 DIAGNOSIS — C680 Malignant neoplasm of urethra: Secondary | ICD-10-CM | POA: Diagnosis not present

## 2018-04-05 DIAGNOSIS — I779 Disorder of arteries and arterioles, unspecified: Secondary | ICD-10-CM

## 2018-04-05 DIAGNOSIS — D508 Other iron deficiency anemias: Secondary | ICD-10-CM | POA: Diagnosis not present

## 2018-04-05 DIAGNOSIS — Z170 Estrogen receptor positive status [ER+]: Secondary | ICD-10-CM

## 2018-04-05 DIAGNOSIS — M85852 Other specified disorders of bone density and structure, left thigh: Secondary | ICD-10-CM | POA: Diagnosis not present

## 2018-04-05 DIAGNOSIS — R58 Hemorrhage, not elsewhere classified: Secondary | ICD-10-CM

## 2018-04-05 DIAGNOSIS — Z923 Personal history of irradiation: Secondary | ICD-10-CM

## 2018-04-05 DIAGNOSIS — N951 Menopausal and female climacteric states: Secondary | ICD-10-CM

## 2018-04-05 DIAGNOSIS — D649 Anemia, unspecified: Secondary | ICD-10-CM

## 2018-04-05 DIAGNOSIS — J9611 Chronic respiratory failure with hypoxia: Secondary | ICD-10-CM

## 2018-04-05 LAB — CBC WITH DIFFERENTIAL/PLATELET
ABS IMMATURE GRANULOCYTES: 0.02 10*3/uL (ref 0.00–0.07)
Basophils Absolute: 0 10*3/uL (ref 0.0–0.1)
Basophils Relative: 0 %
Eosinophils Absolute: 0.2 10*3/uL (ref 0.0–0.5)
Eosinophils Relative: 2 %
HCT: 25.9 % — ABNORMAL LOW (ref 36.0–46.0)
Hemoglobin: 8 g/dL — ABNORMAL LOW (ref 12.0–15.0)
Immature Granulocytes: 0 %
Lymphocytes Relative: 14 %
Lymphs Abs: 1.2 10*3/uL (ref 0.7–4.0)
MCH: 30.8 pg (ref 26.0–34.0)
MCHC: 30.9 g/dL (ref 30.0–36.0)
MCV: 99.6 fL (ref 80.0–100.0)
Monocytes Absolute: 0.6 10*3/uL (ref 0.1–1.0)
Monocytes Relative: 7 %
NRBC: 0 % (ref 0.0–0.2)
Neutro Abs: 6.6 10*3/uL (ref 1.7–7.7)
Neutrophils Relative %: 77 %
Platelets: 287 10*3/uL (ref 150–400)
RBC: 2.6 MIL/uL — ABNORMAL LOW (ref 3.87–5.11)
RDW: 12.9 % (ref 11.5–15.5)
WBC: 8.7 10*3/uL (ref 4.0–10.5)

## 2018-04-05 LAB — COMPREHENSIVE METABOLIC PANEL
ALT: 29 U/L (ref 0–44)
AST: 32 U/L (ref 15–41)
Albumin: 4.1 g/dL (ref 3.5–5.0)
Alkaline Phosphatase: 65 U/L (ref 38–126)
Anion gap: 11 (ref 5–15)
BUN: 32 mg/dL — ABNORMAL HIGH (ref 8–23)
CO2: 27 mmol/L (ref 22–32)
Calcium: 9.7 mg/dL (ref 8.9–10.3)
Chloride: 98 mmol/L (ref 98–111)
Creatinine, Ser: 1.96 mg/dL — ABNORMAL HIGH (ref 0.44–1.00)
GFR calc non Af Amer: 22 mL/min — ABNORMAL LOW (ref >60)
GFR, EST AFRICAN AMERICAN: 26 mL/min — AB (ref >60)
Glucose, Bld: 110 mg/dL — ABNORMAL HIGH (ref 70–99)
POTASSIUM: 3.8 mmol/L (ref 3.5–5.1)
Sodium: 136 mmol/L (ref 135–145)
Total Bilirubin: 0.3 mg/dL (ref 0.3–1.2)
Total Protein: 7.1 g/dL (ref 6.5–8.1)

## 2018-04-05 LAB — IRON AND TIBC
Iron: 36 ug/dL (ref 28–170)
Saturation Ratios: 11 % (ref 10.4–31.8)
TIBC: 336 ug/dL (ref 250–450)
UIBC: 300 ug/dL

## 2018-04-05 LAB — VITAMIN B12: Vitamin B-12: 478 pg/mL (ref 180–914)

## 2018-04-05 LAB — FOLATE: Folate: 39.1 ng/mL (ref >5.9)

## 2018-04-05 LAB — FERRITIN: Ferritin: 96 ng/mL (ref 11–307)

## 2018-04-05 LAB — ABO/RH: ABO/RH(D): A POS

## 2018-04-05 LAB — PREPARE RBC (CROSSMATCH)

## 2018-04-05 NOTE — Assessment & Plan Note (Signed)
1.  Stage I right breast cancer: - Right breast lumpectomy on 05/23/2015, 0.7 cm IDC, grade 1, associated with low-grade DCIS, margins negative, ER positive, PR negative, HER-2 negative, Ki-67 of 10% -Anastrozole started in June 2017.  She is tolerating it very well.  She does have occasional hot flashes. -Right breast lumpectomy site in the lateral breast is stable.  No palpable masses.  No palpable adenopathy. -Her last mammogram was on 06/07/2017, BI-RADS Category 2. - She will continue anastrozole.  We will see her back in 6 months for follow-up.  2.  Osteopenia: -Last DEXA scan on 08/02/2016 shows T score of -1.3. -She had been treated with Fosamax over 10 years and discontinued last year.  She will continue calcium and vitamin D.  I plan to repeat DEXA in May 2020.  3.  High-grade urothelial carcinoma: -She started having hematuria in the later part of last year. -She was evaluated by Dr. Alyson Ingles and a cystoscopy and biopsy on 02/28/2018 shows invasive high-grade urothelial carcinoma with squamous differentiation of the ureter biopsy. - I have reviewed the results of the PET CT scan dated 04/03/2018 which shows some activity in the left ureter.  No evidence of metastatic disease. -She has a follow-up appointment with Dr. Alyson Ingles for resection.  4.  Macrocytic anemia: - Her hemoglobin 2 days ago was 7.8 with an MCV of 100. -Likely etiology from blood loss as well as renal insufficiency.  Her creatinine has gradually gone up to 2.3 since October of last year. -We will check ferritin, iron panel, U01, folic acid and plan to transfuse her 1 unit tomorrow.  She complains of easy tiredness.

## 2018-04-05 NOTE — Patient Instructions (Signed)
[facility] Discharge Instructions     Thank you for choosing [facility] to provide your oncology and hematology care.  To afford each patient quality time with our provider, please arrive at least 15 minutes before your scheduled appointment time.   If you have a lab appointment with the Cancer Center please come in thru the  Main Entrance and check in at the main information desk  You need to re-schedule your appointment should you arrive 10 or more minutes late.  We strive to give you quality time with our providers, and arriving late affects you and other patients whose appointments are after yours.  Also, if you no show three or more times for appointments you may be dismissed from the clinic at the providers discretion.     Again, thank you for choosing [facility].  Our hope is that these requests will decrease the amount of time that you wait before being seen by our physicians.       _____________________________________________________________  Should you have questions after your visit to [facility], please contact our office at [phone] between the hours of 8:00 a.m. and 4:30 p.m.  Voicemails left after 4:00 p.m. will not be returned until the following business day.  For prescription refill requests, have your pharmacy contact our office and allow 72 hours.    Cancer Center Support Programs:   > Cancer Support Group  2nd Tuesday of the month 1pm-2pm, Journey Room    

## 2018-04-05 NOTE — Progress Notes (Signed)
[place], [place] [address]   CLINIC:  Medical Oncology/Hematology  PCP:  Kathyrn Drown, MD [address] 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 AND newly diagnosed left urethra cancer.  CURRENT THERAPY: Arimidex PO daily since 08/2015 AND workup  BRIEF ONCOLOGIC HISTORY:    Breast cancer, right breast ([place])   04/22/2015 Procedure    Right breast needle core biopsy    04/24/2015 Pathology Results    Invasive ductal carcinoma, grade 1.  ER100%, PR NEGATIVE, Ki-67 10%, HER2 NEGATIVE    05/23/2015 Procedure    Right breast lumpectomy by Dr. Dalbert Batman    05/27/2015 Pathology Results    Breast, lumpectomy, Right - INVASIVE GRADE I DUCTAL CARCINOMA SPANNING 0.7 CM IN GREATEST LINEAR DIMENSION. - ASSOCIATED LOW GRADE DUCTAL CARCINOMA IN SITU. - TUMOR SHOWS ASSOCIATED CALCIFICATIONS. - LOBULAR CARCINOMA IN SITU WITH ASSOCIATED CALCIFICATIONS. - MARGINS ARE NEGATIVE.     - 07/23/2015 Radiation Therapy    Dr. Pablo Ledger    08/01/2015 Imaging    Bone density- BMD as determined from Femur Neck Left is 0.891 g/cm2 with a T-Score of -1.1. This patient is considered osteopenic according to [facility]) criteria.    08/14/2015 -  Anti-estrogen oral therapy    Arimidex    08/02/2016 Imaging    Bone density- This patient is considered osteopenic according to [facility]) criteria.       CANCER STAGING: Cancer Staging Breast cancer, right breast ([place]) Staging form: Breast, AJCC 8th Edition - Pathologic stage from 05/27/2015: Stage IB (pT1b, pN0, cM0, G1, ER: Positive, PR: Negative, HER2: Negative) - Signed by Baird Cancer, PA-C on 03/25/2016    INTERVAL HISTORY:  Ms. Creelman 85 y.o. female returns for routine follow-up for right breast cancer and newly diagnosed left urethral cancer. She only had  two episodes of hematuria. She is here today and she has been newly diagnosed and she is waiting on her scans and surgery set up now. She is experiencing fatigue throughout the day. She has lower extremity edema in her legs.Denies any nausea, vomiting, or diarrhea. Denies any new pains. Had not noticed any recent bleeding such as epistaxis or hematochezia. Denies recent chest pain on exertion, shortness of breath on minimal exertion, pre-syncopal episodes, or palpitations. Denies any numbness or tingling in hands or feet. Denies any recent fevers, infections, or recent hospitalizations. Patient reports appetite at 100% and energy level at 25%.   REVIEW OF SYSTEMS:  Review of Systems  Gastrointestinal: Positive for constipation.  All other systems reviewed and are negative.    PAST MEDICAL/SURGICAL HISTORY:  Past Medical History:  Diagnosis Date  . Breast cancer ([place])   . Breast cancer, right breast ([place]) 05/01/2015  . Cancer (H[facility])    BREAST CANCER   . Colon polyps   . COPD (chronic obstructive pulmonary disease) ([place])    DR. [place] Bells  . Diabetes mellitus without complication ([place])    PREDIABETIC  CONTROL W/ DIET  . Hypercholesteremia   . Hypertension   . Lung nodules   . Osteoarthritis   . Osteopenia 09/18/2013  . Osteoporosis   . Personal history of radiation therapy   . PONV (postoperative nausea and vomiting)   . Shortness of breath dyspnea    W/ EXERTION   Past Surgical History:  Procedure Laterality Date  . ABDOMINAL HYSTERECTOMY    .  APPENDECTOMY    . BARTHOLIN GLAND CYST EXCISION    . BREAST EXCISIONAL BIOPSY Left    benign  . BREAST LUMPECTOMY WITH RADIOACTIVE SEED LOCALIZATION Right 05/23/2015   Procedure: RIGHT BREAST LUMPECTOMY WITH RADIOACTIVE SEED LOCALIZATION;  Surgeon: Fanny Skates, MD;  Location: [facility];  Service: General;  Laterality: Right;  . BREAST SURGERY     2000  LEFT BREAST  LUMP REMOVED  . colonoscopy    . COLONOSCOPY  03/25/2011   Procedure:  COLONOSCOPY;  Surgeon: Rogene Houston, MD;  [facility];  Service: Endoscopy;  Laterality: N/A;  9:30 am  . CYSTOSCOPY W/ URETERAL STENT PLACEMENT Left 02/27/2018   Procedure: CYSTOSCOPY WITH LEFT RETROGRADE PYELOGRAM AND lEFT URETEROSCOPY WITH BIOPSY AND LEFT URETERAL STENT PLACEMENT;  Surgeon: Cleon Gustin, MD;  Location: [facility];  Service: Urology;  Laterality: Left;  . left inguinal hernia repair    . multiple toe surgeries    . TONSILLECTOMY       SOCIAL HISTORY:  Social History   Socioeconomic History  . Marital status: Widowed    Spouse name: Not on file  . Number of children: Not on file  . Years of education: Not on file  . Highest education level: Not on file  Occupational History  . Not on file  Social Needs  . Financial resource strain: Not on file  . Food insecurity:    Worry: Not on file    Inability: Not on file  . Transportation needs:    Medical: Not on file    Non-medical: Not on file  Tobacco Use  . Smoking status: Former Smoker    Packs/day: 2.00    Years: 50.00    Pack years: 100.00    Types: Cigarettes    Last attempt to quit: 03/15/1988    Years since quitting: 30.0  . Smokeless tobacco: Never Used  Substance and Sexual Activity  . Alcohol use: No    Alcohol/week: 0.0 standard drinks  . Drug use: No  . Sexual activity: Not on file  Lifestyle  . Physical activity:    Days per week: Not on file    Minutes per session: Not on file  . Stress: Not on file  Relationships  . Social connections:    Talks on phone: Not on file    Gets together: Not on file    Attends religious service: Not on file    Active member of club or organization: Not on file    Attends meetings of clubs or organizations: Not on file    Relationship status: Not on file  . Intimate partner violence:    Fear of current or ex partner: Not on file    Emotionally abused: Not on file    Physically abused: Not on file    Forced sexual activity: Not on file    Other Topics Concern  . Not on file  Social History Narrative  . Not on file    FAMILY HISTORY:  Family History  Problem Relation Age of Onset  . Colon cancer Father     CURRENT MEDICATIONS:  Outpatient Encounter Medications as of 04/05/2018  Medication Sig  . amLODipine (NORVASC) 5 MG tablet Take 1 tablet (5 mg total) by mouth daily.  Marland Kitchen anastrozole (ARIMIDEX) 1 MG tablet Take 1 tablet (1 mg total) by mouth daily. (Patient taking differently: Take 1 mg by mouth every evening. )  . fluticasone (FLONASE) 50 MCG/ACT nasal spray Place 2 sprays into  both nostrils daily. Sometimes uses a second dose qhs  . furosemide (LASIX) 20 MG tablet Take 1 tablet (20 mg total) by mouth daily.  . Multiple Vitamins-Minerals (CENTRUM SILVER 50+WOMEN) TABS Take 1 tablet by mouth daily.  . nortriptyline (PAMELOR) 10 MG capsule Take 3 capsules (30 mg total) by mouth at bedtime. Take 2-3 at night (Patient taking differently: Take 30 mg by mouth See admin instructions. Take 2-3 capsules (20-30 mg) by mouth at bedtime (dosage varies based on pain level))  . polyethylene glycol (MIRALAX / GLYCOLAX) packet Take 17 g by mouth daily as needed (constipation.).   Marland Kitchen PROAIR RESPICLICK 315 (90 Base) MCG/ACT AEPB INHALE 1 PUFF INTO THE LUNGS EVERY 4 HOURS AS NEEDED.  Marland Kitchen STIOLTO RESPIMAT 2.5-2.5 MCG/ACT AERS INHALE 2 PUFFS INTO THE LUNGS ONCE DAILY. (Patient taking differently: Inhale 2 puffs into the lungs daily. )  . traMADol (ULTRAM) 50 MG tablet Take 1 tablet (50 mg total) by mouth every 6 (six) hours as needed for moderate pain.  . [DISCONTINUED] predniSONE (DELTASONE) 10 MG tablet Take 4 tabs for 2 days, then 3 tabs for 2 days, 2 tabs for 2 days, then 1 tab for 2 days, then stop.   No facility-administered encounter medications on file as of 04/05/2018.     ALLERGIES:  Allergies  Allergen Reactions  . Augmentin [Amoxicillin-Pot Clavulanate] Nausea And Vomiting    Has patient had a PCN reaction causing immediate  rash, facial/tongue/throat swelling, SOB or lightheadedness with hypotension: No Has patient had a PCN reaction causing severe rash involving mucus membranes or skin necrosis: No Has patient had a PCN reaction that required hospitalization: No Has patient had a PCN reaction occurring within the last 10 years: Unknown If all of the above answers are "NO", then may proceed with Cephalosporin use.    Marland Kitchen Amoxicillin Diarrhea    Has patient had a PCN reaction causing immediate rash, facial/tongue/throat swelling, SOB or lightheadedness with hypotension: No Has patient had a PCN reaction causing severe rash involving mucus membranes or skin necrosis: No Has patient had a PCN reaction that required hospitalization: No Has patient had a PCN reaction occurring within the last 10 years: Unknown If all of the above answers are "NO", then may proceed with Cephalosporin use.   . Codeine Nausea And Vomiting  . Morphine Nausea And Vomiting  . Neomycin-Bacitracin Zn-Polymyx Itching and Rash    Unable to use Neosporin     PHYSICAL EXAM:  ECOG Performance status: 1  Vitals:   04/05/18 1434  BP: (!) 154/69  Pulse: 91  Resp: 18  Temp: 98.1 F (36.7 C)  SpO2: 100%   Filed Weights   04/05/18 1434  Weight: 162 lb (73.5 kg)    Physical Exam Constitutional:      Appearance: Normal appearance. She is normal weight.  Abdominal:     General: Abdomen is flat.     Palpations: Abdomen is soft.  Musculoskeletal: Normal range of motion.  Skin:    General: Skin is warm and dry.  Neurological:     Mental Status: She is alert and oriented to person, place, and time. Mental status is at baseline.  Psychiatric:        Mood and Affect: Mood normal.        Behavior: Behavior normal.        Thought Content: Thought content normal.        Judgment: Judgment normal.   Breast: No palpable masses, no skin changes or nipple discharge,  no adenopathy.    LABORATORY DATA:  I have reviewed the labs as  listed.  CBC    Component Value Date/Time   WBC 8.6 04/03/2018 1341   RBC 2.54 (L) 04/03/2018 1341   HGB 7.8 (L) 04/03/2018 1341   HGB 10.1 (L) 02/06/2018 0810   HCT 25.4 (L) 04/03/2018 1341   HCT 30.8 (L) 02/06/2018 0810   PLT 280 04/03/2018 1341   PLT 267 02/06/2018 0810   MCV 100.0 04/03/2018 1341   MCV 95 02/06/2018 0810   MCH 30.7 04/03/2018 1341   MCHC 30.7 04/03/2018 1341   RDW 13.1 04/03/2018 1341   RDW 12.7 02/06/2018 0810   LYMPHSABS 1.5 04/03/2018 1341   LYMPHSABS 1.5 02/06/2018 0810   MONOABS 0.6 04/03/2018 1341   EOSABS 0.1 04/03/2018 1341   EOSABS 0.3 02/06/2018 0810   BASOSABS 0.0 04/03/2018 1341   BASOSABS 0.0 02/06/2018 0810   CMP Latest Ref Rng & Units 04/03/2018 04/01/2018 03/28/2018  Glucose 70 - 99 mg/dL 107(H) 125(H) 127(H)  BUN 8 - 23 mg/dL 34(H) 35(H) 37(H)  Creatinine 0.44 - 1.00 mg/dL 2.23(H) 2.25(H) 1.86(H)  Sodium 135 - 145 mmol/L 138 141 137  Potassium 3.5 - 5.1 mmol/L 3.3(L) 3.7 4.1  Chloride 98 - 111 mmol/L 102 99 104  CO2 22 - 32 mmol/L '25 26 24  '$ Calcium 8.9 - 10.3 mg/dL 9.2 9.7 9.3  Total Protein 6.5 - 8.1 g/dL 6.4(L) - -  Total Bilirubin 0.3 - 1.2 mg/dL 0.5 - -  Alkaline Phos 38 - 126 U/L 59 - -  AST 15 - 41 U/L 26 - -  ALT 0 - 44 U/L 26 - -       DIAGNOSTIC IMAGING:  I have independently reviewed the scans and discussed with the patient.   I have reviewed Francene Finders, NP's note and agree with the documentation.  I personally performed a face-to-face visit, made revisions and my assessment and plan is as follows.    ASSESSMENT & PLAN:   Breast cancer, right breast ([place]) 1.  Stage I right breast cancer: - Right breast lumpectomy on 05/23/2015, 0.7 cm IDC, grade 1, associated with low-grade DCIS, margins negative, ER positive, PR negative, HER-2 negative, Ki-67 of 10% -Anastrozole started in June 2017.  She is tolerating it very well.  She does have occasional hot flashes. -Right breast lumpectomy site in the lateral breast is  stable.  No palpable masses.  No palpable adenopathy. -Her last mammogram was on 06/07/2017, BI-RADS Category 2. - She will continue anastrozole.  We will see her back in 6 months for follow-up.  2.  Osteopenia: -Last DEXA scan on 08/02/2016 shows T score of -1.3. -She had been treated with Fosamax over 10 years and discontinued last year.  She will continue calcium and vitamin D.  I plan to repeat DEXA in May 2020.  3.  High-grade urothelial carcinoma: -She started having hematuria in the later part of last year. -She was evaluated by Dr. Alyson Ingles and a cystoscopy and biopsy on 02/28/2018 shows invasive high-grade urothelial carcinoma with squamous differentiation of the ureter biopsy. - I have reviewed the results of the PET CT scan dated 04/03/2018 which shows some activity in the left ureter.  No evidence of metastatic disease. -She has a follow-up appointment with Dr. Alyson Ingles for resection.  4.  Macrocytic anemia: - Her hemoglobin 2 days ago was 7.8 with an MCV of 100. -Likely etiology from blood loss as well as renal insufficiency.  Her creatinine  has gradually gone up to 2.3 since October of last year. -We will check ferritin, iron panel, K52, folic acid and plan to transfuse her 1 unit tomorrow.  She complains of easy tiredness.      Orders placed this encounter:  Orders Placed This Encounter  Procedures  . CBC with Differential/Platelet  . Comprehensive metabolic panel  . Ferritin  . Iron and TIBC  . Vitamin B12  . Folate  . Practitioner attestation of consent  . Complete patient signature process for consent form  . Type and screen  . Type and screen  . Prepare RBC  . Prepare RBC      Derek Jack, MD [facility] [phone]

## 2018-04-06 ENCOUNTER — Encounter (HOSPITAL_COMMUNITY): Payer: Self-pay | Admitting: *Deleted

## 2018-04-06 ENCOUNTER — Telehealth: Payer: Self-pay | Admitting: Pulmonary Disease

## 2018-04-06 LAB — TYPE AND SCREEN
ABO/RH(D): A POS
Antibody Screen: NEGATIVE
UNIT DIVISION: 0

## 2018-04-06 LAB — BPAM RBC
Blood Product Expiration Date: 202002072359
Unit Type and Rh: 6200

## 2018-04-06 NOTE — Progress Notes (Signed)
Notified patient of the change in infusion tomorrow. She will not be getting blood but will be receiving feraheme tomorrow and then again next Friday. I LVMM for patient to return call should she have any questions or concerns.

## 2018-04-06 NOTE — Telephone Encounter (Signed)
Patient questions have already been addressed in another message nothing further needed at this time.

## 2018-04-07 ENCOUNTER — Inpatient Hospital Stay (HOSPITAL_COMMUNITY): Payer: Medicare Other

## 2018-04-07 ENCOUNTER — Encounter (HOSPITAL_COMMUNITY): Payer: Self-pay

## 2018-04-07 ENCOUNTER — Other Ambulatory Visit (HOSPITAL_COMMUNITY): Payer: Self-pay | Admitting: Nurse Practitioner

## 2018-04-07 VITALS — BP 146/56 | HR 86 | Temp 97.9°F | Resp 18

## 2018-04-07 DIAGNOSIS — C50911 Malignant neoplasm of unspecified site of right female breast: Secondary | ICD-10-CM

## 2018-04-07 DIAGNOSIS — D508 Other iron deficiency anemias: Secondary | ICD-10-CM | POA: Diagnosis not present

## 2018-04-07 DIAGNOSIS — J9611 Chronic respiratory failure with hypoxia: Secondary | ICD-10-CM

## 2018-04-07 DIAGNOSIS — N951 Menopausal and female climacteric states: Secondary | ICD-10-CM | POA: Diagnosis not present

## 2018-04-07 DIAGNOSIS — C680 Malignant neoplasm of urethra: Secondary | ICD-10-CM | POA: Diagnosis not present

## 2018-04-07 DIAGNOSIS — R58 Hemorrhage, not elsewhere classified: Secondary | ICD-10-CM | POA: Diagnosis not present

## 2018-04-07 DIAGNOSIS — M85852 Other specified disorders of bone density and structure, left thigh: Secondary | ICD-10-CM | POA: Diagnosis not present

## 2018-04-07 MED ORDER — SODIUM CHLORIDE 0.9 % IV SOLN
510.0000 mg | Freq: Once | INTRAVENOUS | Status: AC
  Administered 2018-04-07: 510 mg via INTRAVENOUS
  Filled 2018-04-07: qty 510

## 2018-04-07 MED ORDER — SODIUM CHLORIDE 0.9 % IV SOLN
INTRAVENOUS | Status: DC
  Administered 2018-04-07: 09:00:00 via INTRAVENOUS

## 2018-04-07 NOTE — Patient Instructions (Signed)
[facility] Discharge Instructions for Patients Receiving Chemotherapy   Beginning January 23rd 2017 lab work for the [facility] will be done in the  Main lab at [facility] on 1st floor. If you have a lab appointment with the [facility] please come in thru the  Main Entrance and check in at the main information desk   Today you received the following chemotherapy agents Feraheme infusion. Follow-up as scheduled. Call clinic for any questions or concerns  To help prevent nausea and vomiting after your treatment, we encourage you to take your nausea medication   If you develop nausea and vomiting, or diarrhea that is not controlled by your medication, call the clinic.  The clinic phone number is [phone]. Office hours are Monday-Friday 8:30am-5:00pm.  BELOW ARE SYMPTOMS THAT SHOULD BE REPORTED IMMEDIATELY:  *FEVER GREATER THAN 101.0 F  *CHILLS WITH OR WITHOUT FEVER  NAUSEA AND VOMITING THAT IS NOT CONTROLLED WITH YOUR NAUSEA MEDICATION  *UNUSUAL SHORTNESS OF BREATH  *UNUSUAL BRUISING OR BLEEDING  TENDERNESS IN MOUTH AND THROAT WITH OR WITHOUT PRESENCE OF ULCERS  *URINARY PROBLEMS  *BOWEL PROBLEMS  UNUSUAL RASH Items with * indicate a potential emergency and should be followed up as soon as possible. If you have an emergency after office hours please contact your primary care physician or go to the nearest emergency department.  Please call the clinic during office hours if you have any questions or concerns.   You may also contact the Patient Navigator at [phone] should you have any questions or need assistance in obtaining follow up care.      Resources For Cancer Patients and their Caregivers ? American Cancer Society: Can assist with transportation, wigs, general needs, runs Look Good Feel Better.        5812706318 ? Cancer Care: Provides financial assistance, online support groups, medication/co-pay assistance.   1-800-813-HOPE 838-724-1460) ? [place] Assists [place] cancer patients and their families through emotional , educational and financial support.  406-201-5594 ? [place] DSS Where to apply for food stamps, Medicaid and utility assistance. (623)393-7965 ? [facility]: Transportation to medical appointments. (669)504-9372 ? Social Security Administration: May apply for disability if have a Stage IV cancer. 4344843974 579-789-5203 ? [employer], Disability and Transit Services: Assists with nutrition, care and transit needs. [phone]

## 2018-04-07 NOTE — Progress Notes (Signed)
[name] tolerated Feraheme infusion well without complaints or incident. VSS upon discharge. Pt discharged self ambulatory using cane in satisfactory condition

## 2018-04-10 ENCOUNTER — Telehealth: Payer: Self-pay | Admitting: Pulmonary Disease

## 2018-04-10 NOTE — Telephone Encounter (Signed)
Spoke with pt, in previous result note BQ wanted to see the pt in the office in the next 2-4 weeks. I made her an appt to come in 04/19/2018 at 10am. Nothing further is needed.

## 2018-04-12 ENCOUNTER — Ambulatory Visit (INDEPENDENT_AMBULATORY_CARE_PROVIDER_SITE_OTHER): Payer: Medicare Other | Admitting: Urology

## 2018-04-12 DIAGNOSIS — Z170 Estrogen receptor positive status [ER+]: Secondary | ICD-10-CM | POA: Diagnosis not present

## 2018-04-12 DIAGNOSIS — C50411 Malignant neoplasm of upper-outer quadrant of right female breast: Secondary | ICD-10-CM | POA: Diagnosis not present

## 2018-04-12 DIAGNOSIS — C669 Malignant neoplasm of unspecified ureter: Secondary | ICD-10-CM

## 2018-04-12 NOTE — Telephone Encounter (Signed)
So noted we will wait till when they see her

## 2018-04-13 ENCOUNTER — Other Ambulatory Visit: Payer: Self-pay | Admitting: Urology

## 2018-04-14 ENCOUNTER — Inpatient Hospital Stay (HOSPITAL_COMMUNITY): Payer: Medicare Other

## 2018-04-14 ENCOUNTER — Encounter (HOSPITAL_COMMUNITY): Payer: Self-pay

## 2018-04-14 ENCOUNTER — Ambulatory Visit (INDEPENDENT_AMBULATORY_CARE_PROVIDER_SITE_OTHER): Payer: Medicare Other | Admitting: Family Medicine

## 2018-04-14 ENCOUNTER — Encounter: Payer: Self-pay | Admitting: Family Medicine

## 2018-04-14 VITALS — BP 142/70 | Ht 62.0 in | Wt 159.0 lb

## 2018-04-14 VITALS — BP 150/52 | HR 88 | Temp 97.6°F | Resp 18

## 2018-04-14 DIAGNOSIS — I10 Essential (primary) hypertension: Secondary | ICD-10-CM

## 2018-04-14 DIAGNOSIS — N951 Menopausal and female climacteric states: Secondary | ICD-10-CM | POA: Diagnosis not present

## 2018-04-14 DIAGNOSIS — R58 Hemorrhage, not elsewhere classified: Secondary | ICD-10-CM | POA: Diagnosis not present

## 2018-04-14 DIAGNOSIS — C662 Malignant neoplasm of left ureter: Secondary | ICD-10-CM | POA: Diagnosis not present

## 2018-04-14 DIAGNOSIS — D508 Other iron deficiency anemias: Secondary | ICD-10-CM | POA: Diagnosis not present

## 2018-04-14 DIAGNOSIS — R600 Localized edema: Secondary | ICD-10-CM | POA: Diagnosis not present

## 2018-04-14 DIAGNOSIS — M85852 Other specified disorders of bone density and structure, left thigh: Secondary | ICD-10-CM | POA: Diagnosis not present

## 2018-04-14 DIAGNOSIS — C50911 Malignant neoplasm of unspecified site of right female breast: Secondary | ICD-10-CM | POA: Diagnosis not present

## 2018-04-14 DIAGNOSIS — J9611 Chronic respiratory failure with hypoxia: Secondary | ICD-10-CM

## 2018-04-14 DIAGNOSIS — C680 Malignant neoplasm of urethra: Secondary | ICD-10-CM | POA: Diagnosis not present

## 2018-04-14 MED ORDER — SODIUM CHLORIDE 0.9% FLUSH
10.0000 mL | Freq: Once | INTRAVENOUS | Status: AC | PRN
  Administered 2018-04-14: 10 mL

## 2018-04-14 MED ORDER — SODIUM CHLORIDE 0.9 % IV SOLN
Freq: Once | INTRAVENOUS | Status: AC
  Administered 2018-04-14: 14:00:00 via INTRAVENOUS

## 2018-04-14 MED ORDER — SODIUM CHLORIDE 0.9 % IV SOLN
510.0000 mg | Freq: Once | INTRAVENOUS | Status: AC
  Administered 2018-04-14: 510 mg via INTRAVENOUS
  Filled 2018-04-14: qty 510

## 2018-04-14 NOTE — Progress Notes (Signed)
   Subjective:    Patient ID: [name], female    DOB: 11-30-1928, 85 y.o.   MRN: [id number]  HPIpt needing surgical clearance. Has robot assisted laparoscopic left distal ureterctomy scheduled for February 27.    Swelling in legs about the same as last visit.  Patient is taking her medicines  She will be seen nephrology later next week She will repeat her kidney functions next week  She denies any chest tightness pressure pain shortness of breath    Review of Systems  Constitutional: Negative for activity change, appetite change and fatigue.  HENT: Negative for congestion and rhinorrhea.   Respiratory: Negative for cough and shortness of breath.   Cardiovascular: Positive for leg swelling. Negative for chest pain.  Gastrointestinal: Negative for abdominal pain and diarrhea.  Endocrine: Negative for polydipsia and polyphagia.  Skin: Negative for color change.  Neurological: Negative for dizziness and weakness.  Psychiatric/Behavioral: Negative for behavioral problems and confusion.       Objective:   Physical Exam Vitals signs reviewed.  Constitutional:      General: She is not in acute distress. HENT:     Head: Normocephalic and atraumatic.  Eyes:     General:        Right eye: No discharge.        Left eye: No discharge.  Neck:     Trachea: No tracheal deviation.  Cardiovascular:     Rate and Rhythm: Normal rate and regular rhythm.     Heart sounds: Normal heart sounds. No murmur.  Pulmonary:     Effort: Pulmonary effort is normal. No respiratory distress.     Breath sounds: Normal breath sounds.  Musculoskeletal:        General: Swelling present.  Lymphadenopathy:     Cervical: No cervical adenopathy.  Skin:    General: Skin is warm and dry.  Neurological:     Mental Status: She is alert.     Coordination: Coordination normal.  Psychiatric:        Behavior: Behavior normal.   Pedal edema noted bilateral        Assessment & Plan:  Medically  I believe the patient is cleared to go ahead with her surgery I am concerned to some degree with her underlying COPD as well as renal insufficiency but this should be able to be properly managed by anesthesiology for her procedure- overall she should do well with the surgery with certainly some risk added because of her age  Certainly if the patient runs into any complications I would recommend consultation with internal medicine/pulmonology/nephrology  We will see the patient for follow-up after her surgery a few weeks later.  Renal insufficiency this is gradually improving.  Is felt that some of this was complicated by the fact that her left ureter growth was causing obstruction but it is hard to know how much improvement we will see with her kidney function continue blood pressure medicine continue diuretic  Patient with mild swelling in the legs this was present before she ever started amlodipine so I believe it is more related to her poor kidney function I do not want to increase the diuretic based on her most recent labs  HTN good control currently.  Systolic slightly elevated but with her age I do not recommend increasing the dose of her medicine

## 2018-04-14 NOTE — Progress Notes (Signed)
Patient tolerated iron infusion with no complaints voiced.  Good blood return noted with peripheral IV before and after infusion.  No bruising or swelling noted at site.  Band aid applied.  VSS with discharge and left ambulatory with no s/s of distress noted.

## 2018-04-14 NOTE — Patient Instructions (Signed)
[facility] at [facility]  Discharge Instructions:   _______________________________________________________________  Thank you for choosing [facility] at [facility] to provide your oncology and hematology care.  To afford each patient quality time with our providers, please arrive at least 15 minutes before your scheduled appointment.  You need to re-schedule your appointment if you arrive 10 or more minutes late.  We strive to give you quality time with our providers, and arriving late affects you and other patients whose appointments are after yours.  Also, if you no show three or more times for appointments you may be dismissed from the clinic.  Again, thank you for choosing [facility] at [facility]. Our hope is that these requests will allow you access to exceptional care and in a timely manner. _______________________________________________________________  If you have questions after your visit, please contact our office at [phone] between the hours of 8:30 a.m. and 5:00 p.m. Voicemails left after 4:30 p.m. will not be returned until the following business day. _______________________________________________________________  For prescription refill requests, have your pharmacy contact our office. _______________________________________________________________  Recommendations made by the consultant and any test results will be sent to your referring physician. _______________________________________________________________ 

## 2018-04-18 ENCOUNTER — Other Ambulatory Visit: Payer: Self-pay | Admitting: *Deleted

## 2018-04-18 DIAGNOSIS — N289 Disorder of kidney and ureter, unspecified: Secondary | ICD-10-CM

## 2018-04-19 ENCOUNTER — Ambulatory Visit (INDEPENDENT_AMBULATORY_CARE_PROVIDER_SITE_OTHER): Payer: Medicare Other | Admitting: Pulmonary Disease

## 2018-04-19 ENCOUNTER — Encounter: Payer: Self-pay | Admitting: Pulmonary Disease

## 2018-04-19 VITALS — BP 154/88 | HR 100 | Ht 62.0 in | Wt 159.0 lb

## 2018-04-19 DIAGNOSIS — J9611 Chronic respiratory failure with hypoxia: Secondary | ICD-10-CM

## 2018-04-19 DIAGNOSIS — J449 Chronic obstructive pulmonary disease, unspecified: Secondary | ICD-10-CM

## 2018-04-19 DIAGNOSIS — C679 Malignant neoplasm of bladder, unspecified: Secondary | ICD-10-CM | POA: Diagnosis not present

## 2018-04-19 DIAGNOSIS — R911 Solitary pulmonary nodule: Secondary | ICD-10-CM | POA: Diagnosis not present

## 2018-04-19 DIAGNOSIS — R918 Other nonspecific abnormal finding of lung field: Secondary | ICD-10-CM | POA: Diagnosis not present

## 2018-04-19 LAB — BASIC METABOLIC PANEL
BUN/Creatinine Ratio: 12 (ref 12–28)
BUN: 28 mg/dL — ABNORMAL HIGH (ref 8–27)
CALCIUM: 9.5 mg/dL (ref 8.7–10.3)
CO2: 25 mmol/L (ref 20–29)
Chloride: 100 mmol/L (ref 96–106)
Creatinine, Ser: 2.26 mg/dL — ABNORMAL HIGH (ref 0.57–1.00)
GFR calc Af Amer: 22 mL/min/{1.73_m2} — ABNORMAL LOW (ref >59)
GFR calc non Af Amer: 19 mL/min/{1.73_m2} — ABNORMAL LOW (ref >59)
Glucose: 143 mg/dL — ABNORMAL HIGH (ref 65–99)
Potassium: 3.9 mmol/L (ref 3.5–5.2)
Sodium: 141 mmol/L (ref 134–144)

## 2018-04-19 NOTE — Progress Notes (Signed)
Subjective:    Patient ID: [name], female    DOB: [birth year], 85 y.o.   MRN: [id number]  Synopsis  COPD, history of breast cancer; Referred in 2016 for chronic cough. Cough had resolved by the time she saw me. She been followed by [facility] for chronic cough for many years prior felt to be related to sinus congestion. She also had pulmonary nodules and some pleural calcification which was stable on chest imaging through 2012 there. After I saw her we did simple spirometry testing which showed clear airflow obstruction with an FEV1 of 48% predicted. However, the FVC was low at 1.38 L (54% predicted).  She smoked 2 packs a day for many years.  She was diagnosed with breast cancer in March 2017 and treated with lumpectomy, radiation therapy, and Arimidex  HPI Chief Complaint  Patient presents with  . Follow-up    discuss CT per pt.      [name] returns to clinic today per my request to go over the results of her most recent PET scan and to talk about her upcoming surgeries.  She has plans for a resection of her identified ureteral mass.  This is to be performed on February 27.  They anticipate a 3-day hospitalization.  She says that recently she has had a bit of shortness of breath but she thinks it is related to the weather changes.  She has not used albuterol for this.  She did have an episode of bronchitis about a month ago and was treated with antibiotics and prednisone by our clinic.  Otherwise things have been okay.  She has been using a cane to walk around and it has been quite helpful.  She says that recently her oxygen level has been in the mid 90s to higher.  She has not been using exertional oxygen.  Past Medical History:  Diagnosis Date  . Breast cancer ([name])   . Breast cancer, right breast ([name]) 05/01/2015  . Cancer (HCC)    BREAST CANCER   . Colon polyps   . COPD (chronic obstructive pulmonary disease) ([place])    DR. Lake [name]  . Diabetes mellitus without  complication ([place])    PREDIABETIC  CONTROL W/ DIET  . Hypercholesteremia   . Hypertension   . Lung nodules   . Osteoarthritis   . Osteopenia 09/18/2013  . Osteoporosis   . Personal history of radiation therapy   . PONV (postoperative nausea and vomiting)   . Shortness of breath dyspnea    W/ EXERTION      Review of Systems  Constitutional: Negative for chills, fatigue and fever.  HENT: Negative for rhinorrhea, sinus pressure and sinus pain.   Respiratory: Positive for shortness of breath. Negative for choking and wheezing.   Cardiovascular: Negative for chest pain, palpitations and leg swelling.       Objective:   Physical Exam Vitals:   04/19/18 0938  BP: (!) 154/88  Pulse: 100  SpO2: 97%  Weight: 159 lb (72.1 kg)  Height: 5\' 2"  (1.575 m)    RA  Gen: well appearing HENT: OP clear, TM's clear, neck supple PULM: CTA B, normal percussion CV: RRR, no mgr, trace edema GI: BS+, soft, nontender Derm: no cyanosis or rash Psyche: normal mood and affect     CBC    Component Value Date/Time   WBC 8.7 04/05/2018 1545   RBC 2.60 (L) 04/05/2018 1545   HGB 8.0 (L) 04/05/2018 1545   HGB 10.1 (L) 02/06/2018  0810   HCT 25.9 (L) 04/05/2018 1545   HCT 30.8 (L) 02/06/2018 0810   PLT 287 04/05/2018 1545   PLT 267 02/06/2018 0810   MCV 99.6 04/05/2018 1545   MCV 95 02/06/2018 0810   MCH 30.8 04/05/2018 1545   MCHC 30.9 04/05/2018 1545   RDW 12.9 04/05/2018 1545   RDW 12.7 02/06/2018 0810   LYMPHSABS 1.2 04/05/2018 1545   LYMPHSABS 1.5 02/06/2018 0810   MONOABS 0.6 04/05/2018 1545   EOSABS 0.2 04/05/2018 1545   EOSABS 0.3 02/06/2018 0810   BASOSABS 0.0 04/05/2018 1545   BASOSABS 0.0 02/06/2018 0810   PFT: 2016 spirometry: Clear airflow obstruction,Ratio 63%  FEV1 0.88 L, 48% predicted  Chest imaging PET 07/2016 nodule not hypermetabolic 99/3716 Chest CT: RLL nodule 76mm, stable; RUL nodule 52mm, stable; rec 6-12 month f/u for the RUL nodule, q2 year follow up for 5  years for the RLL nodule 07/2017 CT  Chest images reviewed personally: 55mm RLL (ground glass) nodule stable, 45mm RUL (solid with calcium) nodule stable, emphysema January 2020 PET scan images independently reviewed showing no changes of pulmonary nodules which have increased FDG activity, no suggestion of metastatic disease, some "postradiation changes in the anterior lateral aspect of the upper right hemithorax", emphysema  Records reviewed since the last visit: On February 27, 2018 she had a mid ureteral tumor ablated by Dr. Alyson Ingles, a left ureteral stent was placed with tether on February 27, 2018.  In Jan 2020 she was seen by our clinic for a COPD exacerbation and was treated with prednisone     Assessment & Plan:   Malignant neoplasm of urinary bladder, unspecified site [name])  Solitary pulmonary nodule  Chronic respiratory failure with hypoxia (HCC)  Chronic obstructive pulmonary disease, unspecified COPD type ([place])  Discussion: Aside from a mild COPD exacerbation earlier in the month this is been a stable interval for bili.  Her most recent chest imaging does not show any new pulmonary problems.  The right upper lobe nodule has been stable for years and has calcium so I am not particularly worried about that one.  The right lower lobe nodule however did not show recent growth on the most recent PET scan.  Should this be an adenocarcinoma the negative PET scan does not necessarily reassure [place].  I am most reassured by the fact that this right lower lobe nodule has not increased in size so at this time I do not think we need to consider a biopsy but I do think that it needs to be followed for a total of 5 years.  Though [name] has severe COPD from my standpoint it is safe for her to proceed with urologic surgery.  I have recommended that she use her Stiolto the day of surgery and the pulmonary service can be available to help manage her while hospitalized.  She reports a bit of  shortness of breath recently, in the last year she required exertional oxygen but she says that her most recent O2 measurements have been normal.  We will check that again today.  Plan: Ureteral malignancy: From my standpoint it is safe for you to proceed with surgery Take Stiolto 2 puffs on the day of surgery After surgery your primary team should have you get out of bed as soon as possible You should use DuoNeb as needed in the [name] for shortness of breath and continue Stiolto daily The pulmonary service is happy to assist with your management postoperatively  Severe COPD:  Continue Stiolto 2 puffs daily Try using albuterol more frequently for shortness of breath  Chronic respiratory failure with hypoxemia: We will check your oxygen level today while walking  Pulmonary nodules: The right upper lobe nodule is stable and has been stable for years so we do not need to worry about that one The right lower lobe nodule however needs continued monitoring, I am going to change the next CT scan to January 2021, 1 year after the most recent PET scan  We will plan on seeing you back in 3 months or sooner if needed, however the pulmonary service is happy to see you while hospitalized  > 50% of this 25 min visit spent face to face   Current Outpatient Medications:  .  amLODipine (NORVASC) 5 MG tablet, Take 1 tablet (5 mg total) by mouth daily., Disp: 30 tablet, Rfl: 2 .  anastrozole (ARIMIDEX) 1 MG tablet, Take 1 tablet (1 mg total) by mouth daily. (Patient taking differently: Take 1 mg by mouth every evening. ), Disp: 90 tablet, Rfl: 3 .  fluticasone (FLONASE) 50 MCG/ACT nasal spray, Place 2 sprays into both nostrils daily. Sometimes uses a second dose qhs, Disp: , Rfl:  .  furosemide (LASIX) 20 MG tablet, Take 1 tablet (20 mg total) by mouth daily., Disp: 30 tablet, Rfl: 3 .  Multiple Vitamins-Minerals (CENTRUM SILVER 50+WOMEN) TABS, Take 1 tablet by mouth daily., Disp: , Rfl:  .   nortriptyline (PAMELOR) 10 MG capsule, Take 3 capsules (30 mg total) by mouth at bedtime. Take 2-3 at night (Patient taking differently: Take 30 mg by mouth See admin instructions. Take 2-3 capsules (20-30 mg) by mouth at bedtime (dosage varies based on pain level)), Disp: 90 capsule, Rfl: 5 .  polyethylene glycol (MIRALAX / GLYCOLAX) packet, Take 17 g by mouth daily as needed (constipation.). , Disp: , Rfl:  .  PROAIR RESPICLICK 883 (90 Base) MCG/ACT AEPB, INHALE 1 PUFF INTO THE LUNGS EVERY 4 HOURS AS NEEDED., Disp: 1 each, Rfl: 5 .  STIOLTO RESPIMAT 2.5-2.5 MCG/ACT AERS, INHALE 2 PUFFS INTO THE LUNGS ONCE DAILY. (Patient taking differently: Inhale 2 puffs into the lungs daily. ), Disp: 4 g, Rfl: 5

## 2018-04-19 NOTE — Addendum Note (Signed)
Addended by: Len Blalock on: 04/19/2018 10:11 AM   Modules accepted: Orders

## 2018-04-19 NOTE — Patient Instructions (Signed)
Ureteral malignancy: From my standpoint it is safe for you to proceed with surgery Take Stiolto 2 puffs on the day of surgery After surgery your primary team should have you get out of bed as soon as possible You should use DuoNeb as needed in the hospital for shortness of breath and continue Stiolto daily The pulmonary service is happy to assist with your management postoperatively  Severe COPD: Continue Stiolto 2 puffs daily Try using albuterol more frequently for shortness of breath  Chronic respiratory failure with hypoxemia: We will check your oxygen level today while walking  Pulmonary nodules: The right upper lobe nodule is stable and has been stable for years so we do not need to worry about that one The right lower lobe nodule however needs continued monitoring, I am going to change the next CT scan to January 2021, 1 year after the most recent PET scan  We will plan on seeing you back in 3 months or sooner if needed, however the pulmonary service is happy to see you while hospitalized

## 2018-04-20 ENCOUNTER — Encounter: Payer: Self-pay | Admitting: Family Medicine

## 2018-04-20 ENCOUNTER — Ambulatory Visit (INDEPENDENT_AMBULATORY_CARE_PROVIDER_SITE_OTHER): Payer: Medicare Other | Admitting: Family Medicine

## 2018-04-20 VITALS — BP 124/70 | Temp 98.1°F | Wt 158.0 lb

## 2018-04-20 DIAGNOSIS — L259 Unspecified contact dermatitis, unspecified cause: Secondary | ICD-10-CM | POA: Diagnosis not present

## 2018-04-20 DIAGNOSIS — N289 Disorder of kidney and ureter, unspecified: Secondary | ICD-10-CM | POA: Diagnosis not present

## 2018-04-20 MED ORDER — PREDNISONE 20 MG PO TABS: 20.0000 mg | ORAL_TABLET | Freq: Every day | ORAL | 0 refills | Status: DC

## 2018-04-20 MED ORDER — TRIAMCINOLONE ACETONIDE 0.1 % EX CREA: 1.0000 "application " | TOPICAL_CREAM | Freq: Two times a day (BID) | CUTANEOUS | 4 refills | Status: DC

## 2018-04-20 NOTE — Progress Notes (Signed)
   Subjective:    Patient ID: [name], female    DOB: [birth year], 85 y.o.   MRN: [id number]  HPI  Patient is here today with complaints of a rash on her chest. She relates this is on the right side and left side she was concerned about the possibility of shingles she states it itches and burns but no blistering. She states she noticed this last week. She states she itches in the area.  She has been using Cortizone 10 and corn starch. She also has upcoming surgery for ureter cancer she also has significant renal insufficiency issues recent lab work which was reviewed with the patient Review of Systems  Constitutional: Negative for activity change, fatigue and fever.  HENT: Negative for congestion and rhinorrhea.   Respiratory: Negative for cough, chest tightness and shortness of breath.   Cardiovascular: Negative for chest pain and leg swelling.  Gastrointestinal: Negative for abdominal pain and nausea.  Skin: Negative for color change.  Neurological: Negative for dizziness and headaches.  Psychiatric/Behavioral: Negative for agitation and behavioral problems.       Objective:   Physical Exam Vitals signs reviewed.  Constitutional:      General: She is not in acute distress. HENT:     Head: Normocephalic and atraumatic.  Eyes:     General:        Right eye: No discharge.        Left eye: No discharge.  Neck:     Trachea: No tracheal deviation.  Cardiovascular:     Rate and Rhythm: Normal rate and regular rhythm.     Heart sounds: Normal heart sounds. No murmur.  Pulmonary:     Effort: Pulmonary effort is normal. No respiratory distress.     Breath sounds: Normal breath sounds.  Lymphadenopathy:     Cervical: No cervical adenopathy.  Skin:    General: Skin is warm and dry.  Neurological:     Mental Status: She is alert.     Coordination: Coordination normal.  Psychiatric:        Behavior: Behavior normal.    Erythematous area on the chest no blisters its  on the left side right side consistent with contact dermatitis       Assessment & Plan:  Contact dermatitis Steroid cream twice daily as needed Short course prednisone next 5 days See no evidence of shingles  Patient was given information regarding her renal insufficiency/dysfunction she will be seen nephrology in tomorrow await their opinion  Patient is surgically cleared for her procedure later this month

## 2018-04-21 DIAGNOSIS — N184 Chronic kidney disease, stage 4 (severe): Secondary | ICD-10-CM | POA: Diagnosis not present

## 2018-04-21 DIAGNOSIS — C800 Disseminated malignant neoplasm, unspecified: Secondary | ICD-10-CM | POA: Diagnosis not present

## 2018-04-21 DIAGNOSIS — I129 Hypertensive chronic kidney disease with stage 1 through stage 4 chronic kidney disease, or unspecified chronic kidney disease: Secondary | ICD-10-CM | POA: Diagnosis not present

## 2018-04-21 DIAGNOSIS — D649 Anemia, unspecified: Secondary | ICD-10-CM | POA: Diagnosis not present

## 2018-04-21 DIAGNOSIS — N179 Acute kidney failure, unspecified: Secondary | ICD-10-CM | POA: Diagnosis not present

## 2018-04-28 ENCOUNTER — Other Ambulatory Visit: Payer: Self-pay | Admitting: Urology

## 2018-05-03 ENCOUNTER — Other Ambulatory Visit: Payer: Self-pay | Admitting: Urology

## 2018-05-05 NOTE — Patient Instructions (Signed)
[name]  05/05/2018   Your procedure is scheduled on: 05-11-2018   Report to [facility] Main  Entrance     Report to [place] at 5:30AM    Call this number if you have problems the morning of surgery [phone]      Remember: Do not eat food or drink liquids :After Midnight. BRUSH YOUR TEETH MORNING OF SURGERY AND RINSE YOUR MOUTH OUT, NO CHEWING GUM CANDY OR MINTS.     Take these medicines the morning of surgery with A SIP OF WATER: AMLODIPINE, PREDNISONE, FLONASE IF NEEDED, STIOLOTO INHALER, PROAIR INHALER                                  You may not have any metal on your body including hair pins and              piercings  Do not wear jewelry, make-up, lotions, powders or perfumes, deodorant             Do not wear nail polish.  Do not shave  48 hours prior to surgery.     Do not bring valuables to the hospital. [place].  Contacts, dentures or bridgework may not be worn into surgery.  Leave suitcase in the car. After surgery it may be brought to your room.                  Please read over the following fact sheets you were given: _____________________________________________________________________             [facility] - Preparing for Surgery Before surgery, you can play an important role.  Because skin is not sterile, your skin needs to be as free of germs as possible.  You can reduce the number of germs on your skin by washing with CHG (chlorahexidine gluconate) soap before surgery.  CHG is an antiseptic cleaner which kills germs and bonds with the skin to continue killing germs even after washing. Please DO NOT use if you have an allergy to CHG or antibacterial soaps.  If your skin becomes reddened/irritated stop using the CHG and inform your nurse when you arrive at Short Stay. Do not shave (including legs and underarms) for at least 48 hours prior to the first CHG shower.   You may shave your face/neck. Please follow these instructions carefully:  1.  Shower with CHG Soap the night before surgery and the  morning of Surgery.  2.  If you choose to wash your hair, wash your hair first as usual with your  normal  shampoo.  3.  After you shampoo, rinse your hair and body thoroughly to remove the  shampoo.                           4.  Use CHG as you would any other liquid soap.  You can apply chg directly  to the skin and wash                       Gently with a scrungie or clean washcloth.  5.  Apply the CHG Soap to your body ONLY FROM THE NECK DOWN.  Do not use on face/ open                           Wound or open sores. Avoid contact with eyes, ears mouth and genitals (private parts).                       Wash face,  Genitals (private parts) with your normal soap.             6.  Wash thoroughly, paying special attention to the area where your surgery  will be performed.  7.  Thoroughly rinse your body with warm water from the neck down.  8.  DO NOT shower/wash with your normal soap after using and rinsing off  the CHG Soap.                9.  Pat yourself dry with a clean towel.            10.  Wear clean pajamas.            11.  Place clean sheets on your bed the night of your first shower and do not  sleep with pets. Day of Surgery : Do not apply any lotions/deodorants the morning of surgery.  Please wear clean clothes to the hospital/surgery center.  FAILURE TO FOLLOW THESE INSTRUCTIONS MAY RESULT IN THE CANCELLATION OF YOUR SURGERY PATIENT SIGNATURE_________________________________  NURSE SIGNATURE__________________________________  ________________________________________________________________________

## 2018-05-05 NOTE — Progress Notes (Signed)
PULMONARY CLEARANCE DR. Lake Bells 04-19-2018 Epic   MEDICAL CLEARANCE DR. Sallee Lange 04-14-2018 Epic   EKG 02-27-18 Epic   ECHO 07-08-16 Epic   CXR 03-28-2018 Epic

## 2018-05-08 ENCOUNTER — Encounter (HOSPITAL_COMMUNITY)
Admission: RE | Admit: 2018-05-08 | Discharge: 2018-05-08 | Disposition: A | Discharge status: Home / Self Care | Payer: Medicare Other | Source: Ambulatory Visit | Attending: Urology | Admitting: Urology

## 2018-05-08 ENCOUNTER — Other Ambulatory Visit: Payer: Self-pay

## 2018-05-08 ENCOUNTER — Encounter (HOSPITAL_COMMUNITY): Payer: Self-pay

## 2018-05-08 DIAGNOSIS — Z01812 Encounter for preprocedural laboratory examination: Secondary | ICD-10-CM | POA: Insufficient documentation

## 2018-05-08 HISTORY — DX: Bitten or stung by nonvenomous insect and other nonvenomous arthropods, initial encounter: W57.XXXA

## 2018-05-08 LAB — HEMOGLOBIN A1C
Hgb A1c MFr Bld: 5.2 % (ref 4.8–5.6)
Mean Plasma Glucose: 102.54 mg/dL

## 2018-05-08 LAB — CBC
HEMATOCRIT: 27.7 % — AB (ref 36.0–46.0)
HEMOGLOBIN: 8.4 g/dL — AB (ref 12.0–15.0)
MCH: 31.5 pg (ref 26.0–34.0)
MCHC: 30.3 g/dL (ref 30.0–36.0)
MCV: 103.7 fL — ABNORMAL HIGH (ref 80.0–100.0)
Platelets: 231 10*3/uL (ref 150–400)
RBC: 2.67 MIL/uL — ABNORMAL LOW (ref 3.87–5.11)
RDW: 13.2 % (ref 11.5–15.5)
WBC: 7.7 10*3/uL (ref 4.0–10.5)
nRBC: 0 % (ref 0.0–0.2)

## 2018-05-08 LAB — BASIC METABOLIC PANEL
Anion gap: 10 (ref 5–15)
BUN: 27 mg/dL — ABNORMAL HIGH (ref 8–23)
CHLORIDE: 106 mmol/L (ref 98–111)
CO2: 28 mmol/L (ref 22–32)
Calcium: 9.7 mg/dL (ref 8.9–10.3)
Creatinine, Ser: 1.97 mg/dL — ABNORMAL HIGH (ref 0.44–1.00)
GFR calc Af Amer: 26 mL/min — ABNORMAL LOW (ref >60)
GFR calc non Af Amer: 22 mL/min — ABNORMAL LOW (ref >60)
Glucose, Bld: 112 mg/dL — ABNORMAL HIGH (ref 70–99)
Potassium: 3 mmol/L — ABNORMAL LOW (ref 3.5–5.1)
Sodium: 144 mmol/L (ref 135–145)

## 2018-05-08 NOTE — Anesthesia Preprocedure Evaluation (Deleted)
Anesthesia Evaluation    Airway        Dental   Pulmonary former smoker,           Cardiovascular hypertension,      Neuro/Psych    GI/Hepatic   Endo/Other  diabetes  Renal/GU      Musculoskeletal   Abdominal   Peds  Hematology   Anesthesia Other Findings   Reproductive/Obstetrics                             Anesthesia Physical Anesthesia Plan  ASA:   Anesthesia Plan:    Post-op Pain Management:    Induction:   PONV Risk Score and Plan:   Airway Management Planned:   Additional Equipment:   Intra-op Plan:   Post-operative Plan:   Informed Consent:   Plan Discussed with:   Anesthesia Plan Comments: (See PAT note 05/08/18, Konrad Felix, PA-C)        Anesthesia Quick Evaluation

## 2018-05-08 NOTE — Progress Notes (Signed)
Anesthesia Chart Review   Case:  [id number] Date/Time:  05/11/18 0700   Procedures:      XI ROBOT ASSITED LAPAROSCOPIC NEPHROURETERECTOMY (Left )     CYSTOSCOPY WITH RETROGRADE PYELOGRAM/URETERAL STENT PLACEMENT (Left )   Anesthesia type:  General   Pre-op diagnosis:  LEFT URETERAL TRANSITIONAL CELL CARCINOMA   Location:  [facility] ROOM 03 / [facility]   Surgeon:  Cleon Gustin, MD      DISCUSSION: 85 yo former smoker (100 pack years, quit 03/15/88) with h/o PONV, COPD, breast cancer (s/p radiation 2017), HTN, left ureteral transitional cell carcinoma scheduled for above surgery 05/11/18 with Dr. Nicolette Bang.   Pt recently had iron infusion 04/14/2018.  Hemoglobin 8.4 at PAT visit 05/08/2018.    Pt last seen by PCP Dr. Sallee Lange 2/66/20.  Per his note pt is to follow up with nephrology for renal insufficiency.  In previous notes he states he feels pt is stable for upcoming surgery and that renal insufficiency is gradually improving.   He states, "Patient is surgically cleared for her procedure later this month."    Pt last seen by pulmonologist, Dr. Simonne Maffucci, on 04/19/2018.  Per his note, "Though Ms. Georganna Skeans has severe COPD from my standpoint it is safe for her to proceed with urologic surgery.  I have recommended that she use her Stiolto the day of surgery and the pulmonary service can be available to help manage her while hospitalized."  Pt can proceed with planned procedure barring acute status change.  VS: BP (!) 155/64   Pulse 100   Temp 36.6 C (Oral)   Resp 16   Ht 5\' 2"  (1.575 m)   Wt 71.7 kg   SpO2 97%   BMI 28.90 kg/m   PROVIDERS: Kathyrn Drown, MD is PCP   Simonne Maffucci, MD is Pulmonologist  LABS: Labs reviewed: Acceptable for surgery. (all labs ordered are listed, but only abnormal results are displayed)  Labs Reviewed  BASIC METABOLIC PANEL - Abnormal; Notable for the following components:      Result Value   Potassium 3.0 (*)    Glucose, Bld 112 (*)     BUN 27 (*)    Creatinine, Ser 1.97 (*)    GFR calc non Af Amer 22 (*)    GFR calc Af Amer 26 (*)    All other components within normal limits  CBC - Abnormal; Notable for the following components:   RBC 2.67 (*)    Hemoglobin 8.4 (*)    HCT 27.7 (*)    MCV 103.7 (*)    All other components within normal limits  HEMOGLOBIN A1C     IMAGES: Chest Xray 03/28/2018 IMPRESSION: COPD changes.  Enlargement of cardiac silhouette.  No acute abnormalities.  Carotid Doppler 06/08/2016 IMPRESSION: Color duplex indicates minimal heterogeneous and calcified plaque, with no hemodynamically significant stenosis by duplex criteria in the extracranial cerebrovascular circulation.  Right vertebral artery not visualized.  EKG: 02/27/18 Rate 88 bpm Normal sinus rhythm  Cannot rule out Anterior infarct age undetermined Abnormal ECG  CV: Echo 07/08/2016 Study Conclusions  - Left ventricle: The cavity size was normal. Wall thickness was   increased in a pattern of mild LVH. Systolic function was   vigorous. The estimated ejection fraction was in the range of 70%   to 75%. Wall motion was normal; there were no regional wall   motion abnormalities. Left ventricular diastolic function   parameters were normal for the patient&'s age. - Aortic  valve: Mildly calcified annulus. Trileaflet. Mean gradient   (S): 5 mm Hg. Valve area (Vmax): 2.62 cm^2. - Ascending aorta: The ascending aorta was mildly ectatic. - Right atrium: Central venous pressure (est): 3 mm Hg. - Tricuspid valve: There was trivial regurgitation. - Pulmonary arteries: Systolic pressure could not be accurately   estimated. - Pericardium, extracardiac: A prominent pericardial fat pad was   present. A trivial pericardial effusion was identified posterior   to the heart.  Impressions:  - Mild LVH with LVEF 70-75% and grossly normal diastolic function.   Mildly sclerotic aortic valve. Mildly ectatic ascending aorta.    Trivial tricuspid regurgitation. Trivial posterior pericardial   effusion. Past Medical History:  Diagnosis Date  . Breast cancer ([place])   . Breast cancer, right breast ([place]) 05/01/2015  . Cancer (HCC)    BREAST CANCER   . Colon polyps   . COPD (chronic obstructive pulmonary disease) ([place])    DR. Lake Bells  . Diabetes mellitus without complication ([place])    PREDIABETIC  CONTROL W/ DIET  . Hypercholesteremia   . Hypertension   . Lung nodules   . Osteoarthritis   . Osteopenia 09/18/2013  . Osteoporosis   . Personal history of radiation therapy   . PONV (postoperative nausea and vomiting)    ' i get so sick with anesthesia"   . Shortness of breath dyspnea    W/ EXERTION  . Tick bite    3 years ago     Past Surgical History:  Procedure Laterality Date  . ABDOMINAL HYSTERECTOMY    . APPENDECTOMY    . BARTHOLIN GLAND CYST EXCISION    . BREAST EXCISIONAL BIOPSY Left    benign  . BREAST LUMPECTOMY WITH RADIOACTIVE SEED LOCALIZATION Right 05/23/2015   Procedure: RIGHT BREAST LUMPECTOMY WITH RADIOACTIVE SEED LOCALIZATION;  Surgeon: Fanny Skates, MD;  Location: [facility];  Service: General;  Laterality: Right;  . BREAST SURGERY     2000  LEFT BREAST  LUMP REMOVED  . cataract surgeries  Bilateral   . colonoscopy    . COLONOSCOPY  03/25/2011   Procedure: COLONOSCOPY;  Surgeon: Rogene Houston, MD;  Location: [facility];  Service: Endoscopy;  Laterality: N/A;  9:30 am  . CYSTOSCOPY W/ URETERAL STENT PLACEMENT Left 02/27/2018   Procedure: CYSTOSCOPY WITH LEFT RETROGRADE PYELOGRAM AND lEFT URETEROSCOPY WITH BIOPSY AND LEFT URETERAL STENT PLACEMENT;  Surgeon: Cleon Gustin, MD;  Location: [facility];  Service: Urology;  Laterality: Left;  . left inguinal hernia repair    . multiple toe surgeries    . TONSILLECTOMY      MEDICATIONS: . OXYGEN  . amLODipine (NORVASC) 5 MG tablet  . anastrozole (ARIMIDEX) 1 MG tablet  . fluticasone (FLONASE) 50 MCG/ACT nasal spray  . furosemide (LASIX) 20  MG tablet  . Multiple Vitamins-Minerals (CENTRUM SILVER 50+WOMEN) TABS  . nortriptyline (PAMELOR) 10 MG capsule  . polyethylene glycol (MIRALAX / GLYCOLAX) packet  . predniSONE (DELTASONE) 20 MG tablet  . PROAIR RESPICLICK 048 (90 Base) MCG/ACT AEPB  . STIOLTO RESPIMAT 2.5-2.5 MCG/ACT AERS  . triamcinolone cream (KENALOG) 0.1 %   No current facility-administered medications for this encounter.      Maia Plan [facility] Pre-Surgical Testing  [phone] 05/08/18 4:01 PM

## 2018-05-11 ENCOUNTER — Other Ambulatory Visit: Payer: Self-pay

## 2018-05-11 ENCOUNTER — Encounter (HOSPITAL_COMMUNITY)
Admission: RE | Disposition: A | Discharge status: Home / Self Care | Payer: Self-pay | Source: Home / Self Care | Attending: Urology

## 2018-05-11 ENCOUNTER — Encounter (HOSPITAL_COMMUNITY): Payer: Self-pay | Admitting: Certified Registered Nurse Anesthetist

## 2018-05-11 ENCOUNTER — Inpatient Hospital Stay (HOSPITAL_COMMUNITY): Payer: Medicare Other

## 2018-05-11 ENCOUNTER — Inpatient Hospital Stay (HOSPITAL_COMMUNITY): Payer: Medicare Other | Admitting: Physician Assistant

## 2018-05-11 ENCOUNTER — Inpatient Hospital Stay (HOSPITAL_COMMUNITY)
Admission: RE | Admit: 2018-05-11 | Discharge: 2018-05-16 | DRG: 654 | Disposition: A | Discharge status: Home / Self Care | Payer: Medicare Other | Attending: Urology | Admitting: Urology

## 2018-05-11 DIAGNOSIS — C662 Malignant neoplasm of left ureter: Secondary | ICD-10-CM | POA: Diagnosis present

## 2018-05-11 DIAGNOSIS — K567 Ileus, unspecified: Secondary | ICD-10-CM | POA: Diagnosis not present

## 2018-05-11 DIAGNOSIS — I129 Hypertensive chronic kidney disease with stage 1 through stage 4 chronic kidney disease, or unspecified chronic kidney disease: Secondary | ICD-10-CM | POA: Diagnosis present

## 2018-05-11 DIAGNOSIS — Z853 Personal history of malignant neoplasm of breast: Secondary | ICD-10-CM | POA: Diagnosis not present

## 2018-05-11 DIAGNOSIS — Z170 Estrogen receptor positive status [ER+]: Secondary | ICD-10-CM

## 2018-05-11 DIAGNOSIS — Z79899 Other long term (current) drug therapy: Secondary | ICD-10-CM | POA: Diagnosis not present

## 2018-05-11 DIAGNOSIS — D631 Anemia in chronic kidney disease: Secondary | ICD-10-CM | POA: Diagnosis present

## 2018-05-11 DIAGNOSIS — D500 Iron deficiency anemia secondary to blood loss (chronic): Secondary | ICD-10-CM | POA: Diagnosis present

## 2018-05-11 DIAGNOSIS — C676 Malignant neoplasm of ureteric orifice: Secondary | ICD-10-CM | POA: Diagnosis not present

## 2018-05-11 DIAGNOSIS — E785 Hyperlipidemia, unspecified: Secondary | ICD-10-CM | POA: Diagnosis present

## 2018-05-11 DIAGNOSIS — Z881 Allergy status to other antibiotic agents status: Secondary | ICD-10-CM | POA: Diagnosis not present

## 2018-05-11 DIAGNOSIS — R310 Gross hematuria: Secondary | ICD-10-CM | POA: Diagnosis present

## 2018-05-11 DIAGNOSIS — N183 Chronic kidney disease, stage 3 (moderate): Secondary | ICD-10-CM | POA: Diagnosis present

## 2018-05-11 DIAGNOSIS — R911 Solitary pulmonary nodule: Secondary | ICD-10-CM | POA: Diagnosis present

## 2018-05-11 DIAGNOSIS — C679 Malignant neoplasm of bladder, unspecified: Secondary | ICD-10-CM | POA: Diagnosis present

## 2018-05-11 DIAGNOSIS — C50911 Malignant neoplasm of unspecified site of right female breast: Secondary | ICD-10-CM

## 2018-05-11 DIAGNOSIS — J449 Chronic obstructive pulmonary disease, unspecified: Secondary | ICD-10-CM | POA: Diagnosis present

## 2018-05-11 DIAGNOSIS — N2889 Other specified disorders of kidney and ureter: Secondary | ICD-10-CM | POA: Diagnosis not present

## 2018-05-11 DIAGNOSIS — Z87891 Personal history of nicotine dependence: Secondary | ICD-10-CM

## 2018-05-11 HISTORY — PX: ROBOT ASSITED LAPAROSCOPIC NEPHROURETERECTOMY: SHX6077

## 2018-05-11 LAB — POCT I-STAT EG7
Acid-Base Excess: 3 mmol/L — ABNORMAL HIGH (ref 0.0–2.0)
Bicarbonate: 27.7 mmol/L (ref 20.0–28.0)
Calcium, Ion: 1.22 mmol/L (ref 1.15–1.40)
HCT: 24 % — ABNORMAL LOW (ref 36.0–46.0)
HEMOGLOBIN: 8.2 g/dL — AB (ref 12.0–15.0)
O2 Saturation: 56 %
POTASSIUM: 3.6 mmol/L (ref 3.5–5.1)
Sodium: 138 mmol/L (ref 135–145)
TCO2: 29 mmol/L (ref 22–32)
pCO2, Ven: 44.2 mmHg (ref 44.0–60.0)
pH, Ven: 7.405 (ref 7.250–7.430)
pO2, Ven: 30 mmHg — CL (ref 32.0–45.0)

## 2018-05-11 LAB — TYPE AND SCREEN
ABO/RH(D): A POS
Antibody Screen: NEGATIVE

## 2018-05-11 LAB — HEMOGLOBIN AND HEMATOCRIT, BLOOD
HCT: 26.2 % — ABNORMAL LOW (ref 36.0–46.0)
Hemoglobin: 8 g/dL — ABNORMAL LOW (ref 12.0–15.0)

## 2018-05-11 LAB — ABO/RH: ABO/RH(D): A POS

## 2018-05-11 SURGERY — NEPHROURETERECTOMY, ROBOT-ASSISTED, LAPAROSCOPIC
Anesthesia: General | Site: Abdomen | Laterality: Left

## 2018-05-11 MED ORDER — PROPOFOL 10 MG/ML IV BOLUS
INTRAVENOUS | Status: DC | PRN
  Administered 2018-05-11: 100 mg via INTRAVENOUS

## 2018-05-11 MED ORDER — OXYCODONE HCL 5 MG/5ML PO SOLN: 5.0000 mg | Freq: Once | ORAL | Status: DC | PRN

## 2018-05-11 MED ORDER — SUCCINYLCHOLINE CHLORIDE 200 MG/10ML IV SOSY
PREFILLED_SYRINGE | INTRAVENOUS | Status: AC
  Filled 2018-05-11: qty 10

## 2018-05-11 MED ORDER — HYDROMORPHONE HCL 2 MG/ML IJ SOLN
INTRAMUSCULAR | Status: AC
  Filled 2018-05-11: qty 1

## 2018-05-11 MED ORDER — PHENYLEPHRINE HCL 10 MG/ML IJ SOLN
INTRAMUSCULAR | Status: AC
  Filled 2018-05-11: qty 2

## 2018-05-11 MED ORDER — LIDOCAINE 2% (20 MG/ML) 5 ML SYRINGE
INTRAMUSCULAR | Status: DC | PRN
  Administered 2018-05-11: 60 mg via INTRAVENOUS

## 2018-05-11 MED ORDER — BUPIVACAINE LIPOSOME 1.3 % IJ SUSP
20.0000 mL | Freq: Once | INTRAMUSCULAR | Status: AC
  Administered 2018-05-11: 20 mL
  Filled 2018-05-11: qty 20

## 2018-05-11 MED ORDER — SODIUM CHLORIDE (PF) 0.9 % IJ SOLN
INTRAMUSCULAR | Status: DC | PRN
  Administered 2018-05-11: 20 mL

## 2018-05-11 MED ORDER — PROPOFOL 10 MG/ML IV BOLUS
INTRAVENOUS | Status: AC
  Filled 2018-05-11: qty 20

## 2018-05-11 MED ORDER — DEXTROSE-NACL 5-0.45 % IV SOLN
INTRAVENOUS | Status: DC
  Administered 2018-05-11 – 2018-05-13 (×5): via INTRAVENOUS

## 2018-05-11 MED ORDER — ROCURONIUM BROMIDE 10 MG/ML (PF) SYRINGE
PREFILLED_SYRINGE | INTRAVENOUS | Status: DC | PRN
  Administered 2018-05-11: 10 mg via INTRAVENOUS
  Administered 2018-05-11: 40 mg via INTRAVENOUS
  Administered 2018-05-11 (×3): 10 mg via INTRAVENOUS

## 2018-05-11 MED ORDER — ESMOLOL HCL 100 MG/10ML IV SOLN
INTRAVENOUS | Status: DC | PRN
  Administered 2018-05-11: 50 mg via INTRAVENOUS

## 2018-05-11 MED ORDER — SUGAMMADEX SODIUM 500 MG/5ML IV SOLN
INTRAVENOUS | Status: DC | PRN
  Administered 2018-05-11: 300 mg via INTRAVENOUS

## 2018-05-11 MED ORDER — ACETAMINOPHEN 325 MG PO TABS: 650.0000 mg | ORAL_TABLET | ORAL | Status: DC | PRN

## 2018-05-11 MED ORDER — CEFAZOLIN SODIUM-DEXTROSE 2-4 GM/100ML-% IV SOLN
2.0000 g | INTRAVENOUS | Status: AC
  Administered 2018-05-11: 2 g via INTRAVENOUS
  Filled 2018-05-11: qty 100

## 2018-05-11 MED ORDER — DEXAMETHASONE SODIUM PHOSPHATE 10 MG/ML IJ SOLN
INTRAMUSCULAR | Status: AC
  Filled 2018-05-11: qty 1

## 2018-05-11 MED ORDER — NORTRIPTYLINE HCL 10 MG PO CAPS
20.0000 mg | ORAL_CAPSULE | Freq: Every day | ORAL | Status: DC
  Administered 2018-05-12 – 2018-05-15 (×4): 20 mg via ORAL
  Filled 2018-05-11 (×5): qty 2

## 2018-05-11 MED ORDER — SODIUM CHLORIDE 0.9 % IR SOLN
Status: DC | PRN
  Administered 2018-05-11: 1000 mL via INTRAVESICAL

## 2018-05-11 MED ORDER — ONDANSETRON HCL 4 MG/2ML IJ SOLN
INTRAMUSCULAR | Status: AC
  Filled 2018-05-11: qty 2

## 2018-05-11 MED ORDER — BELLADONNA ALKALOIDS-OPIUM 16.2-60 MG RE SUPP: 1.0000 | Freq: Four times a day (QID) | RECTAL | Status: DC | PRN

## 2018-05-11 MED ORDER — HYDROMORPHONE HCL 1 MG/ML IJ SOLN: 0.5000 mg | INTRAMUSCULAR | Status: DC | PRN

## 2018-05-11 MED ORDER — ANASTROZOLE 1 MG PO TABS
1.0000 mg | ORAL_TABLET | Freq: Every evening | ORAL | Status: DC
  Administered 2018-05-12 – 2018-05-15 (×4): 1 mg via ORAL
  Filled 2018-05-11 (×6): qty 1

## 2018-05-11 MED ORDER — DIPHENHYDRAMINE HCL 12.5 MG/5ML PO ELIX: 12.5000 mg | ORAL_SOLUTION | Freq: Four times a day (QID) | ORAL | Status: DC | PRN

## 2018-05-11 MED ORDER — SODIUM CHLORIDE 0.9 % IV SOLN
INTRAVENOUS | Status: DC | PRN
  Administered 2018-05-11: 50 ug/min via INTRAVENOUS

## 2018-05-11 MED ORDER — FENTANYL CITRATE (PF) 100 MCG/2ML IJ SOLN: 25.0000 ug | INTRAMUSCULAR | Status: DC | PRN

## 2018-05-11 MED ORDER — ONDANSETRON HCL 4 MG/2ML IJ SOLN
4.0000 mg | INTRAMUSCULAR | Status: DC | PRN
  Administered 2018-05-11 – 2018-05-12 (×2): 4 mg via INTRAVENOUS
  Filled 2018-05-11 (×2): qty 2

## 2018-05-11 MED ORDER — MAGNESIUM CITRATE PO SOLN
1.0000 | Freq: Once | ORAL | Status: DC
  Filled 2018-05-11: qty 296

## 2018-05-11 MED ORDER — DIPHENHYDRAMINE HCL 50 MG/ML IJ SOLN
INTRAMUSCULAR | Status: AC
  Filled 2018-05-11: qty 1

## 2018-05-11 MED ORDER — PROPOFOL 500 MG/50ML IV EMUL
INTRAVENOUS | Status: DC | PRN
  Administered 2018-05-11: 25 ug/kg/min via INTRAVENOUS

## 2018-05-11 MED ORDER — DIPHENHYDRAMINE HCL 50 MG/ML IJ SOLN
INTRAMUSCULAR | Status: DC | PRN
  Administered 2018-05-11: 12.5 mg via INTRAVENOUS

## 2018-05-11 MED ORDER — SODIUM CHLORIDE (PF) 0.9 % IJ SOLN
INTRAMUSCULAR | Status: AC
  Filled 2018-05-11: qty 50

## 2018-05-11 MED ORDER — FUROSEMIDE 20 MG PO TABS
20.0000 mg | ORAL_TABLET | Freq: Every day | ORAL | Status: DC
  Administered 2018-05-11 – 2018-05-16 (×6): 20 mg via ORAL
  Filled 2018-05-11 (×6): qty 1

## 2018-05-11 MED ORDER — ONDANSETRON HCL 4 MG/2ML IJ SOLN
INTRAMUSCULAR | Status: DC | PRN
  Administered 2018-05-11 (×2): 4 mg via INTRAVENOUS

## 2018-05-11 MED ORDER — DEXAMETHASONE SODIUM PHOSPHATE 10 MG/ML IJ SOLN
INTRAMUSCULAR | Status: DC | PRN
  Administered 2018-05-11: 10 mg via INTRAVENOUS

## 2018-05-11 MED ORDER — SODIUM CHLORIDE 0.9 % IV SOLN
INTRAVENOUS | Status: DC
  Administered 2018-05-11 (×3): via INTRAVENOUS

## 2018-05-11 MED ORDER — ONDANSETRON HCL 4 MG/2ML IJ SOLN
INTRAMUSCULAR | Status: AC
  Filled 2018-05-11: qty 4

## 2018-05-11 MED ORDER — LIDOCAINE 2% (20 MG/ML) 5 ML SYRINGE
INTRAMUSCULAR | Status: AC
  Filled 2018-05-11: qty 5

## 2018-05-11 MED ORDER — STERILE WATER FOR IRRIGATION IR SOLN
Status: DC | PRN
  Administered 2018-05-11: 1000 mL

## 2018-05-11 MED ORDER — SODIUM CHLORIDE 0.9 % IV SOLN
INTRAVENOUS | Status: DC | PRN
  Administered 2018-05-11: 08:00:00 via INTRAVENOUS

## 2018-05-11 MED ORDER — ACETAMINOPHEN 10 MG/ML IV SOLN
1000.0000 mg | Freq: Once | INTRAVENOUS | Status: AC
  Administered 2018-05-11: 1000 mg via INTRAVENOUS
  Filled 2018-05-11: qty 100

## 2018-05-11 MED ORDER — FENTANYL CITRATE (PF) 250 MCG/5ML IJ SOLN
INTRAMUSCULAR | Status: DC | PRN
  Administered 2018-05-11: 50 ug via INTRAVENOUS

## 2018-05-11 MED ORDER — OXYCODONE HCL 5 MG PO TABS: 5.0000 mg | ORAL_TABLET | Freq: Once | ORAL | Status: DC | PRN

## 2018-05-11 MED ORDER — LACTATED RINGERS IV SOLN
INTRAVENOUS | Status: DC | PRN
  Administered 2018-05-11: 10:00:00 via INTRAVENOUS

## 2018-05-11 MED ORDER — FENTANYL CITRATE (PF) 250 MCG/5ML IJ SOLN
INTRAMUSCULAR | Status: AC
  Filled 2018-05-11: qty 5

## 2018-05-11 MED ORDER — DOCUSATE SODIUM 100 MG PO CAPS
100.0000 mg | ORAL_CAPSULE | Freq: Two times a day (BID) | ORAL | Status: DC
  Administered 2018-05-12 – 2018-05-16 (×9): 100 mg via ORAL
  Filled 2018-05-11 (×10): qty 1

## 2018-05-11 MED ORDER — TRAMADOL HCL 50 MG PO TABS: 50.0000 mg | ORAL_TABLET | Freq: Four times a day (QID) | ORAL | 0 refills | Status: DC | PRN

## 2018-05-11 MED ORDER — ESMOLOL HCL 100 MG/10ML IV SOLN
INTRAVENOUS | Status: AC
  Filled 2018-05-11: qty 10

## 2018-05-11 MED ORDER — TRAMADOL HCL 50 MG PO TABS: 100.0000 mg | ORAL_TABLET | Freq: Two times a day (BID) | ORAL | Status: DC | PRN

## 2018-05-11 MED ORDER — SUCCINYLCHOLINE CHLORIDE 200 MG/10ML IV SOSY
PREFILLED_SYRINGE | INTRAVENOUS | Status: DC | PRN
  Administered 2018-05-11: 80 mg via INTRAVENOUS

## 2018-05-11 MED ORDER — AMLODIPINE BESYLATE 5 MG PO TABS
5.0000 mg | ORAL_TABLET | Freq: Every day | ORAL | Status: DC
  Administered 2018-05-12 – 2018-05-16 (×5): 5 mg via ORAL
  Filled 2018-05-11 (×5): qty 1

## 2018-05-11 MED ORDER — ONDANSETRON HCL 4 MG/2ML IJ SOLN
4.0000 mg | Freq: Once | INTRAMUSCULAR | Status: AC | PRN
  Administered 2018-05-11: 4 mg via INTRAVENOUS

## 2018-05-11 MED ORDER — DIPHENHYDRAMINE HCL 50 MG/ML IJ SOLN: 12.5000 mg | Freq: Four times a day (QID) | INTRAMUSCULAR | Status: DC | PRN

## 2018-05-11 MED ORDER — ALBUTEROL SULFATE (2.5 MG/3ML) 0.083% IN NEBU: 2.5000 mg | INHALATION_SOLUTION | RESPIRATORY_TRACT | Status: DC | PRN

## 2018-05-11 MED ORDER — LACTATED RINGERS IV SOLN
INTRAVENOUS | Status: DC
  Administered 2018-05-11: 1000 mL via INTRAVENOUS

## 2018-05-11 MED ORDER — ROCURONIUM BROMIDE 100 MG/10ML IV SOLN
INTRAVENOUS | Status: AC
  Filled 2018-05-11: qty 1

## 2018-05-11 MED ORDER — HYDROMORPHONE HCL 1 MG/ML IJ SOLN
INTRAMUSCULAR | Status: DC | PRN
  Administered 2018-05-11 (×3): 0.5 mg via INTRAVENOUS

## 2018-05-11 MED ORDER — LACTATED RINGERS IR SOLN
Status: DC | PRN
  Administered 2018-05-11: 1000 mL

## 2018-05-11 SURGICAL SUPPLY — 78 items
BAG LAPAROSCOPIC 12 15 PORT 16 (BASKET) ×2 IMPLANT
BAG RETRIEVAL 12/15 (BASKET) ×3
BAG RETRIEVAL 12/15MM (BASKET) ×1
BAG URINE DRAINAGE (UROLOGICAL SUPPLIES) ×4 IMPLANT
BAG URO CATCHER STRL LF (MISCELLANEOUS) ×4 IMPLANT
CATH FOLEY 3WAY  5CC 18FR (CATHETERS)
CATH FOLEY 3WAY 5CC 18FR (CATHETERS) IMPLANT
CATH INTERMIT  6FR 70CM (CATHETERS) ×4 IMPLANT
CHLORAPREP W/TINT 26ML (MISCELLANEOUS) ×4 IMPLANT
CLIP VESOLOCK LG 6/CT PURPLE (CLIP) ×8 IMPLANT
CLIP VESOLOCK MED LG 6/CT (CLIP) ×8 IMPLANT
CLOTH BEACON ORANGE TIMEOUT ST (SAFETY) ×4 IMPLANT
COVER SURGICAL LIGHT HANDLE (MISCELLANEOUS) ×4 IMPLANT
COVER TIP SHEARS 8 DVNC (MISCELLANEOUS) ×2 IMPLANT
COVER TIP SHEARS 8MM DA VINCI (MISCELLANEOUS) ×2
COVER WAND RF STERILE (DRAPES) ×4 IMPLANT
DECANTER SPIKE VIAL GLASS SM (MISCELLANEOUS) ×4 IMPLANT
DERMABOND ADVANCED (GAUZE/BANDAGES/DRESSINGS) ×2
DERMABOND ADVANCED .7 DNX12 (GAUZE/BANDAGES/DRESSINGS) ×2 IMPLANT
DRAIN CHANNEL 15F RND FF 3/16 (WOUND CARE) ×4 IMPLANT
DRAIN CHANNEL RND F F (WOUND CARE) ×4 IMPLANT
DRAPE ARM DVNC X/XI (DISPOSABLE) ×8 IMPLANT
DRAPE COLUMN DVNC XI (DISPOSABLE) ×2 IMPLANT
DRAPE DA VINCI XI ARM (DISPOSABLE) ×8
DRAPE DA VINCI XI COLUMN (DISPOSABLE) ×2
DRAPE INCISE IOBAN 66X45 STRL (DRAPES) ×4 IMPLANT
DRAPE SHEET LG 3/4 BI-LAMINATE (DRAPES) ×4 IMPLANT
DRSG TEGADERM 4X4.75 (GAUZE/BANDAGES/DRESSINGS) ×4 IMPLANT
ELECT PENCIL ROCKER SW 15FT (MISCELLANEOUS) ×4 IMPLANT
ELECT REM PT RETURN 15FT ADLT (MISCELLANEOUS) ×4 IMPLANT
EVACUATOR SILICONE 100CC (DRAIN) ×8 IMPLANT
EXTRACTOR STONE NITINOL NGAGE (UROLOGICAL SUPPLIES) IMPLANT
FIBER LASER TRAC TIP (UROLOGICAL SUPPLIES) IMPLANT
GAUZE SPONGE 4X4 12PLY STRL (GAUZE/BANDAGES/DRESSINGS) ×4 IMPLANT
GLOVE BIO SURGEON STRL SZ 6.5 (GLOVE) ×3 IMPLANT
GLOVE BIO SURGEON STRL SZ8 (GLOVE) ×4 IMPLANT
GLOVE BIO SURGEONS STRL SZ 6.5 (GLOVE) ×1
GLOVE BIOGEL M STRL SZ7.5 (GLOVE) ×8 IMPLANT
GOWN STRL REUS W/TWL LRG LVL3 (GOWN DISPOSABLE) ×12 IMPLANT
GOWN STRL REUS W/TWL XL LVL3 (GOWN DISPOSABLE) ×4 IMPLANT
GUIDEWIRE ANG ZIPWIRE 038X150 (WIRE) ×4 IMPLANT
GUIDEWIRE STR DUAL SENSOR (WIRE) ×8 IMPLANT
IRRIG SUCT STRYKERFLOW 2 WTIP (MISCELLANEOUS) ×4
IRRIGATION SUCT STRKRFLW 2 WTP (MISCELLANEOUS) ×2 IMPLANT
IV NS 1000ML (IV SOLUTION) ×2
IV NS 1000ML BAXH (IV SOLUTION) ×2 IMPLANT
KIT BASIN OR (CUSTOM PROCEDURE TRAY) ×4 IMPLANT
MANIFOLD NEPTUNE II (INSTRUMENTS) ×4 IMPLANT
NEEDLE INSUFFLATION 14GA 120MM (NEEDLE) ×4 IMPLANT
NS IRRIG 1000ML POUR BTL (IV SOLUTION) ×4 IMPLANT
PACK CYSTO (CUSTOM PROCEDURE TRAY) ×4 IMPLANT
PLUG CATH AND CAP STER (CATHETERS) ×4 IMPLANT
POUCH SPECIMEN RETRIEVAL 10MM (ENDOMECHANICALS) ×4 IMPLANT
PROTECTOR NERVE ULNAR (MISCELLANEOUS) ×8 IMPLANT
SEAL CANN UNIV 5-8 DVNC XI (MISCELLANEOUS) ×8 IMPLANT
SEAL XI 5MM-8MM UNIVERSAL (MISCELLANEOUS) ×8
SET IRRIG Y TYPE TUR BLADDER L (SET/KITS/TRAYS/PACK) ×4 IMPLANT
SHEATH URETERAL 12FRX35CM (MISCELLANEOUS) IMPLANT
SOLUTION ELECTROLUBE (MISCELLANEOUS) ×4 IMPLANT
SPONGE LAP 4X18 RFD (DISPOSABLE) ×4 IMPLANT
STAPLE ECHEON FLEX 60 POW ENDO (STAPLE) IMPLANT
STENT URET 6FRX26 CONTOUR (STENTS) ×4 IMPLANT
SUT ETHILON 3 0 PS 1 (SUTURE) ×4 IMPLANT
SUT MNCRL AB 4-0 PS2 18 (SUTURE) ×8 IMPLANT
SUT PDS AB 1 CTX 36 (SUTURE) ×8 IMPLANT
SUT VICRYL 0 UR6 27IN ABS (SUTURE) ×4 IMPLANT
SUT VLOC BARB 180 ABS3/0GR12 (SUTURE) ×24
SUTURE VLOC BRB 180 ABS3/0GR12 (SUTURE) ×12 IMPLANT
TOWEL OR 17X26 10 PK STRL BLUE (TOWEL DISPOSABLE) ×4 IMPLANT
TOWEL OR NON WOVEN STRL DISP B (DISPOSABLE) ×4 IMPLANT
TRAY FOLEY MTR SLVR 16FR STAT (SET/KITS/TRAYS/PACK) ×4 IMPLANT
TRAY LAPAROSCOPIC (CUSTOM PROCEDURE TRAY) ×4 IMPLANT
TROCAR BLADELESS OPT 5 100 (ENDOMECHANICALS) IMPLANT
TROCAR XCEL 12X100 BLDLESS (ENDOMECHANICALS) ×4 IMPLANT
TUBE FEEDING 8FR 16IN STR KANG (MISCELLANEOUS) IMPLANT
TUBING CONNECTING 10 (TUBING) ×3 IMPLANT
TUBING CONNECTING 10' (TUBING) ×1
WATER STERILE IRR 1000ML POUR (IV SOLUTION) ×4 IMPLANT

## 2018-05-11 NOTE — H&P (Signed)
Urology Admission H&P  Chief Complaint: left ureteral tumor  History of Present Illness: Ms Mcilrath is a 85yo with distal ureteral high grade TCC here for distal ureterectomy. She was having intermittent gross hematuria and on CT was found to have a distal ureteral tumor. Biopsy of the tumor revealed high grade TCC. She has moderate LUTS with the stent in place. NO nausea/vomiting.   Past Medical History:  Diagnosis Date  . Breast cancer ([place])   . Breast cancer, right breast ([place]) 05/01/2015  . Cancer ([facility])    BREAST CANCER   . Colon polyps   . COPD (chronic obstructive pulmonary disease) ([place])    DR. [place]  . Diabetes mellitus without complication ([place])    PREDIABETIC  CONTROL W/ DIET  . Hypercholesteremia   . Hypertension   . Lung nodules   . Osteoarthritis   . Osteopenia 09/18/2013  . Osteoporosis   . Personal history of radiation therapy   . PONV (postoperative nausea and vomiting)    ' i get so sick with anesthesia"   . Shortness of breath dyspnea    W/ EXERTION  . Tick bite    3 years ago    Past Surgical History:  Procedure Laterality Date  . ABDOMINAL HYSTERECTOMY    . APPENDECTOMY    . BARTHOLIN GLAND CYST EXCISION    . BREAST EXCISIONAL BIOPSY Left    benign  . BREAST LUMPECTOMY WITH RADIOACTIVE SEED LOCALIZATION Right 05/23/2015   Procedure: RIGHT BREAST LUMPECTOMY WITH RADIOACTIVE SEED LOCALIZATION;  Surgeon: Fanny Skates, MD;  Location: [facility];  Service: General;  Laterality: Right;  . BREAST SURGERY     2000  LEFT BREAST  LUMP REMOVED  . cataract surgeries  Bilateral   . colonoscopy    . COLONOSCOPY  03/25/2011   Procedure: COLONOSCOPY;  Surgeon: Rogene Houston, MD;  [facility]: [facility];  Service: Endoscopy;  Laterality: N/A;  9:30 am  . CYSTOSCOPY W/ URETERAL STENT PLACEMENT Left 02/27/2018   Procedure: CYSTOSCOPY WITH LEFT RETROGRADE PYELOGRAM AND lEFT URETEROSCOPY WITH BIOPSY AND LEFT URETERAL STENT PLACEMENT;  Surgeon: Cleon Gustin, MD;   Location: [facility];  Service: Urology;  Laterality: Left;  . left inguinal hernia repair    . multiple toe surgeries    . TONSILLECTOMY      Home Medications:  Current Facility-Administered Medications  Medication Dose Route Frequency Provider Last Rate Last Dose  . 0.9 %  sodium chloride infusion   Intravenous Continuous Audry Pili, MD 50 mL/hr at 05/11/18 [phone]    . bupivacaine liposome (EXPAREL) 1.3 % injection 266 mg  20 mL Infiltration Once Cleon Gustin, MD      . ceFAZolin (ANCEF) IVPB 2g/100 mL premix  2 g Intravenous 30 min Pre-Op Jonnette Nuon, Candee Furbish, MD      . magnesium citrate solution 1 Bottle  1 Bottle Oral Once Hasana Alcorta, Candee Furbish, MD       Allergies:  Allergies  Allergen Reactions  . Augmentin [Amoxicillin-Pot Clavulanate] Nausea And Vomiting    Has patient had a PCN reaction causing immediate rash, facial/tongue/throat swelling, SOB or lightheadedness with hypotension: No Has patient had a PCN reaction causing severe rash involving mucus membranes or skin necrosis: No Has patient had a PCN reaction that required hospitalization: No Has patient had a PCN reaction occurring within the last 10 years: Unknown If all of the above answers are "NO", then may proceed with Cephalosporin use.    Marland Kitchen Amoxicillin Diarrhea  Has patient had a PCN reaction causing immediate rash, facial/tongue/throat swelling, SOB or lightheadedness with hypotension: No Has patient had a PCN reaction causing severe rash involving mucus membranes or skin necrosis: No Has patient had a PCN reaction that required hospitalization: No Has patient had a PCN reaction occurring within the last 10 years: Unknown If all of the above answers are "NO", then may proceed with Cephalosporin use.   . Codeine Nausea And Vomiting  . Morphine Nausea And Vomiting  . Neomycin-Bacitracin Zn-Polymyx Itching and Rash    Unable to use Neosporin    Family History  Problem Relation Age of Onset  . Colon cancer  Father    Social History:  reports that she quit smoking about 30 years ago. Her smoking use included cigarettes. She has a 100.00 pack-year smoking history. She has never used smokeless tobacco. She reports current alcohol use. She reports that she does not use drugs.  Review of Systems  Genitourinary: Positive for hematuria.  All other systems reviewed and are negative.   Physical Exam:  Vital signs in last 24 hours: Temp:  [98.3 F (36.8 C)] 98.3 F (36.8 C) (02/27 0640) Pulse Rate:  [97] 97 (02/27 0640) Resp:  [18] 18 (02/27 0640) BP: (157)/(78) 157/78 (02/27 0640) SpO2:  [100 %] 100 % (02/27 0640) Weight:  [71.7 kg] 71.7 kg (02/27 0347) Physical Exam  Constitutional: She is oriented to person, place, and time. She appears well-developed and well-nourished.  HENT:  Head: Normocephalic and atraumatic.  Eyes: Pupils are equal, round, and reactive to light. EOM are normal.  Neck: Normal range of motion. No thyromegaly present.  Cardiovascular: Normal rate and regular rhythm.  Respiratory: Effort normal. No respiratory distress.  GI: Soft. She exhibits no distension.  Musculoskeletal: Normal range of motion.        General: No edema.  Neurological: She is alert and oriented to person, place, and time.  Skin: Skin is warm and dry.  Psychiatric: She has a normal mood and affect. Her behavior is normal. Judgment and thought content normal.    Laboratory Data:  Results for orders placed or performed during the hospital encounter of 05/11/18 (from the past 24 hour(s))  POCT I-Stat EG7     Status: Abnormal   Collection Time: 05/11/18  7:05 AM  Result Value Ref Range   pH, Ven 7.405 7.250 - 7.430   pCO2, Ven 44.2 44.0 - 60.0 mmHg   pO2, Ven 30.0 (LL) 32.0 - 45.0 mmHg   Bicarbonate 27.7 20.0 - 28.0 mmol/L   TCO2 29 22 - 32 mmol/L   O2 Saturation 56.0 %   Acid-Base Excess 3.0 (H) 0.0 - 2.0 mmol/L   Sodium 138 135 - 145 mmol/L   Potassium 3.6 3.5 - 5.1 mmol/L   Calcium, Ion  1.22 1.15 - 1.40 mmol/L   HCT 24.0 (L) 36.0 - 46.0 %   Hemoglobin 8.2 (L) 12.0 - 15.0 g/dL   Patient temperature HIDE    Sample type VENOUS    Comment VALUES EXPECTED, NO REPEAT    No results found for this or any previous visit (from the past 240 hour(s)). Creatinine: Recent Labs    05/08/18 1141  CREATININE 1.97*   Baseline Creatinine: 2  Impression/Assessment:  85yo with left distal ureteral calculus  Plan:  The risks/benefits/alternatives to roboto assisted laparoscopic left distal ureterectomy was explained to the patient and she understands and wishes to proceed with surgery  Nicolette Bang 05/11/2018, 7:38 AM

## 2018-05-11 NOTE — Discharge Instructions (Signed)

## 2018-05-11 NOTE — Brief Op Note (Signed)
05/11/2018  4:27 PM  PATIENT:  [name]  85 y.o. female  PRE-OPERATIVE DIAGNOSIS:  LEFT URETERAL TRANSITIONAL CELL CARCINOMA  POST-OPERATIVE DIAGNOSIS:  LEFT URETERAL TRANSITIONAL CELL CARCINOMA  PROCEDURE:  Procedure(s): XI ROBOT ASSITED LAPAROSCOPIC DISTAL URETERECTOMY WITH REIMPLANTATION. (Left) WITH PSOAS HITCH AND BOARI FLAP. pelvic lymph node dissection, left ureteral stent placement.  SURGEON:  Surgeon(s) and Role:    * Magon Croson, Candee Furbish, MD - Primary  PHYSICIAN ASSISTANT:   ASSISTANTS: Debbrah Alar   ANESTHESIA:   general  EBL:  100 mL   BLOOD ADMINISTERED:none  DRAINS: (1) Jackson-Pratt drain(s) with closed bulb suction in the left lower quadrant, Urinary Catheter (Foley) and left 6x26 JJ ureteral stent   LOCAL MEDICATIONS USED:  MARCAINE     SPECIMEN:  Source of Specimen:  left iliac lymph nodes, left distal ureters, left distal ureteral margin  DISPOSITION OF SPECIMEN:  PATHOLOGY  COUNTS:  YES  TOURNIQUET:  * No tourniquets in log *  DICTATION: .Note written in EPIC  PLAN OF CARE: Admit to inpatient   PATIENT DISPOSITION:  PACU - hemodynamically stable.   Delay start of Pharmacological VTE agent (>24hrs) due to surgical blood loss or risk of bleeding: not applicable

## 2018-05-11 NOTE — Anesthesia Procedure Notes (Deleted)
Performed by: Mitzie Na, CRNA

## 2018-05-11 NOTE — Anesthesia Postprocedure Evaluation (Signed)
Anesthesia Post Note  Patient: [name]  Procedure(s) Performed: XI ROBOT ASSITED LAPAROSCOPIC DISTAL URETERECTOMY WITH REIMPLANTATION. (Left Abdomen)     Patient location during evaluation: PACU Anesthesia Type: General Level of consciousness: awake and alert Pain management: pain level controlled Vital Signs Assessment: post-procedure vital signs reviewed and stable Respiratory status: spontaneous breathing, nonlabored ventilation, respiratory function stable and patient connected to nasal cannula oxygen Cardiovascular status: blood pressure returned to baseline and stable Postop Assessment: no apparent nausea or vomiting Anesthetic complications: no    Last Vitals:  Vitals:   05/11/18 1518 05/11/18 1702  BP: (!) 147/68 (!) 144/73  Pulse: 87 86  Resp: 16 16  Temp: 36.4 C 36.4 C  SpO2: 98% 99%    Last Pain:  Vitals:   05/11/18 1702  TempSrc: Oral  PainSc:                  [name]

## 2018-05-11 NOTE — Progress Notes (Signed)
Pt arrived to unit from PACU on stretcher. Slid to bed w/ 3 assist. VSS. Reports incisional pain 4/10 and tolerable. Reports feeling very nauseated, zofran given prior to leaving PACU. JP w/ ss drainaged. Foley w/ pink tinged urine. Pt oriented to callbell and environment. POC discussed.

## 2018-05-11 NOTE — Anesthesia Preprocedure Evaluation (Addendum)
Anesthesia Evaluation  Patient identified by MRN, date of birth, ID band Patient awake    Reviewed: Allergy & Precautions, NPO status , Patient's Chart, lab work & pertinent test results  History of Anesthesia Complications (+) PONVNegative for: history of anesthetic complications  Airway Mallampati: II  TM Distance: >3 FB Neck ROM: Full    Dental  (+) Edentulous Upper, Edentulous Lower   Pulmonary COPD (O2 PRN with activity),  oxygen dependent, former smoker,    breath sounds clear to auscultation (-) wheezing      Cardiovascular hypertension,  Rhythm:Regular Rate:Normal     Neuro/Psych negative neurological ROS  negative psych ROS   GI/Hepatic negative GI ROS, Neg liver ROS,   Endo/Other  negative endocrine ROSdiabetes  Renal/GU negative Renal ROS  negative genitourinary   Musculoskeletal negative musculoskeletal ROS (+)   Abdominal   Peds  Hematology  (+) anemia ,   Anesthesia Other Findings 85 yo F for robotic nephroureterectomy - former smoker (100 pack years, quit 03/15/88), h/o PONV, severe COPD, HTN, breast cancer (s/p XRT 2017), anemia (Hgb 8.4) - Pt last seen by pulmonologist, Dr. Simonne Maffucci, on 04/19/2018.  Per his note, "Though Ms. Georganna Skeans has severe COPD from my standpoint it is safe for her to proceed with urologic surgery. I have recommended that she use her Stiolto the day of surgery and the pulmonary service can be available to help manage her while hospitalized." - unremarkable TTE 06/2016  Reproductive/Obstetrics                            Anesthesia Physical Anesthesia Plan  ASA: III  Anesthesia Plan: General   Post-op Pain Management:    Induction: Intravenous  PONV Risk Score and Plan: 4 or greater and Ondansetron, Dexamethasone, Propofol infusion and Treatment may vary due to age or medical condition  Airway Management Planned: Oral ETT  Additional  Equipment: None  Intra-op Plan:   Post-operative Plan: Extubation in OR and Possible Post-op intubation/ventilation  Informed Consent: I have reviewed the patients History and Physical, chart, labs and discussed the procedure including the risks, benefits and alternatives for the proposed anesthesia with the patient or authorized representative who has indicated his/her understanding and acceptance.       Plan Discussed with:   Anesthesia Plan Comments: (Specifically discussed plan to extubate but small possibility of need for postop ventilation given history of severe COPD with intermittent home oxygen use.)       Anesthesia Quick Evaluation

## 2018-05-11 NOTE — Anesthesia Procedure Notes (Signed)
Procedure Name: Intubation Date/Time: 05/11/2018 7:59 AM Performed by: Clyde Lundborg, RN Pre-anesthesia Checklist: Patient identified, Emergency Drugs available, Suction available, Patient being monitored and Timeout performed Patient Re-evaluated:Patient Re-evaluated prior to induction Oxygen Delivery Method: Circle system utilized Preoxygenation: Pre-oxygenation with 100% oxygen Induction Type: IV induction, Cricoid Pressure applied and Rapid sequence Laryngoscope Size: Mac and 3 Grade View: Grade I Tube type: Oral Tube size: 7.0 mm Number of attempts: 1 Airway Equipment and Method: Stylet Placement Confirmation: ETT inserted through vocal cords under direct vision,  positive ETCO2 and breath sounds checked- equal and bilateral Secured at: 21 cm Tube secured with: Tape Dental Injury: Teeth and Oropharynx as per pre-operative assessment

## 2018-05-11 NOTE — Transfer of Care (Signed)
Immediate Anesthesia Transfer of Care Note  Patient: [name]  Procedure(s) Performed: XI ROBOT ASSITED LAPAROSCOPIC DISTAL URETERECTOMY WITH REIMPLANTATION. (Left Abdomen)  Patient Location: PACU  Anesthesia Type:General  Level of Consciousness: drowsy and patient cooperative  Airway & Oxygen Therapy: Patient Spontanous Breathing and Patient connected to face mask oxygen  Post-op Assessment: Report given to RN, Post -op Vital signs reviewed and stable and Patient moving all extremities  Post vital signs: Reviewed and stable  Last Vitals:  Vitals Value Taken Time  BP 144/71 05/11/2018 12:35 PM  Temp    Pulse 71 05/11/2018 12:40 PM  Resp 8 05/11/2018 12:40 PM  SpO2 98 % 05/11/2018 12:40 PM  Vitals shown include unvalidated device data.  Last Pain:  Vitals:   05/11/18 0640  TempSrc: Oral  PainSc:       Patients Stated Pain Goal: 3 (11/73/56 7014)  Complications: No apparent anesthesia complications

## 2018-05-12 ENCOUNTER — Encounter (HOSPITAL_COMMUNITY): Payer: Self-pay | Admitting: Urology

## 2018-05-12 LAB — HEMOGLOBIN AND HEMATOCRIT, BLOOD
HCT: 23.7 % — ABNORMAL LOW (ref 36.0–46.0)
Hemoglobin: 7.4 g/dL — ABNORMAL LOW (ref 12.0–15.0)

## 2018-05-12 LAB — BASIC METABOLIC PANEL
Anion gap: 7 (ref 5–15)
BUN: 28 mg/dL — ABNORMAL HIGH (ref 8–23)
CO2: 28 mmol/L (ref 22–32)
Calcium: 8.7 mg/dL — ABNORMAL LOW (ref 8.9–10.3)
Chloride: 104 mmol/L (ref 98–111)
Creatinine, Ser: 1.92 mg/dL — ABNORMAL HIGH (ref 0.44–1.00)
GFR calc Af Amer: 27 mL/min — ABNORMAL LOW (ref >60)
GFR calc non Af Amer: 23 mL/min — ABNORMAL LOW (ref >60)
Glucose, Bld: 141 mg/dL — ABNORMAL HIGH (ref 70–99)
Potassium: 3.9 mmol/L (ref 3.5–5.1)
Sodium: 139 mmol/L (ref 135–145)

## 2018-05-12 NOTE — Plan of Care (Signed)
  Problem: Skin Integrity: Goal: Demonstration of wound healing without infection will improve Outcome: Progressing   Problem: Education: Goal: Knowledge of General Education information will improve Description Including pain rating scale, medication(s)/side effects and non-pharmacologic comfort measures Outcome: Progressing   Problem: Health Behavior/Discharge Planning: Goal: Ability to manage health-related needs will improve Outcome: Progressing   Problem: Activity: Goal: Risk for activity intolerance will decrease Outcome: Progressing   Problem: Pain Managment: Goal: General experience of comfort will improve Outcome: Progressing

## 2018-05-12 NOTE — Progress Notes (Signed)
1 Day Post-Op Subjective: Patient reports mild abdominal pain. Urine is clear. Patient tolerating clear liquids  Objective: Vital signs in last 24 hours: Temp:  [97.6 F (36.4 C)-98.2 F (36.8 C)] 98.2 F (36.8 C) (02/28 1225) Pulse Rate:  [86-102] 102 (02/28 1225) Resp:  [16-20] 16 (02/28 1225) BP: (144-155)/(67-73) 148/67 (02/28 1225) SpO2:  [97 %-99 %] 97 % (02/28 1225)  Intake/Output from previous day: 02/27 0701 - 02/28 0700 In: 3710.9 [P.O.:180; I.V.:3330.9; IV Piggyback:200] Out: 1910 [BEEFE:0712; Drains:235; Blood:100] Intake/Output this shift: Total I/O In: 840 [P.O.:240; I.V.:600] Out: -   Physical Exam:  General:alert, cooperative and appears stated age GI: soft, non tender, normal bowel sounds, no palpable masses, no organomegaly, no inguinal hernia Female genitalia: not done Extremities: extremities normal, atraumatic, no cyanosis or edema  Lab Results: Recent Labs    05/11/18 0705 05/11/18 1320 05/12/18 0356  HGB 8.2* 8.0* 7.4*  HCT 24.0* 26.2* 23.7*   BMET Recent Labs    05/11/18 0705 05/12/18 0356  NA 138 139  K 3.6 3.9  CL  --  104  CO2  --  28  GLUCOSE  --  141*  BUN  --  28*  CREATININE  --  1.92*  CALCIUM  --  8.7*   No results for input(s): LABPT, INR in the last 72 hours. No results for input(s): LABURIN in the last 72 hours. Results for orders placed or performed during the hospital encounter of 02/22/18  Urine Culture     Status: Abnormal   Collection Time: 02/22/18 11:57 AM  Result Value Ref Range Status   Specimen Description   Final    URINE, RANDOM Performed at [facility], [address], [place], [place] [address]    Special Requests   Final    NONE Performed at [facility], [address], [place], [place] [address]    Culture >=100,000 COLONIES/mL ESCHERICHIA COLI (A)  Final   Report Status 02/25/2018 FINAL  Final   Organism ID, Bacteria ESCHERICHIA COLI (A)  Final      Susceptibility   Escherichia coli - MIC*   AMPICILLIN <=2 SENSITIVE Sensitive     CEFAZOLIN <=4 SENSITIVE Sensitive     CEFTRIAXONE <=1 SENSITIVE Sensitive     CIPROFLOXACIN <=0.25 SENSITIVE Sensitive     GENTAMICIN <=1 SENSITIVE Sensitive     IMIPENEM <=0.25 SENSITIVE Sensitive     NITROFURANTOIN <=16 SENSITIVE Sensitive     TRIMETH/SULFA <=20 SENSITIVE Sensitive     AMPICILLIN/SULBACTAM <=2 SENSITIVE Sensitive     PIP/TAZO <=4 SENSITIVE Sensitive     Extended ESBL NEGATIVE Sensitive     * >=100,000 COLONIES/mL ESCHERICHIA COLI    Studies/Results: No results found.  Assessment/Plan: POD#1 ureteral resection and reimplant  1. Continue foley catheter 2. Advance diet as tolerated 3. Ambulate in halls with assistance   LOS: 1 day   Nicolette Bang 05/12/2018, 4:46 PM

## 2018-05-13 DIAGNOSIS — N2889 Other specified disorders of kidney and ureter: Secondary | ICD-10-CM

## 2018-05-13 LAB — CBC
HCT: 24.7 % — ABNORMAL LOW (ref 36.0–46.0)
Hemoglobin: 7.3 g/dL — ABNORMAL LOW (ref 12.0–15.0)
MCH: 30.9 pg (ref 26.0–34.0)
MCHC: 29.6 g/dL — ABNORMAL LOW (ref 30.0–36.0)
MCV: 104.7 fL — AB (ref 80.0–100.0)
Platelets: 237 10*3/uL (ref 150–400)
RBC: 2.36 MIL/uL — ABNORMAL LOW (ref 3.87–5.11)
RDW: 13.2 % (ref 11.5–15.5)
WBC: 10.1 10*3/uL (ref 4.0–10.5)
nRBC: 0 % (ref 0.0–0.2)

## 2018-05-13 LAB — CREATININE, FLUID (PLEURAL, PERITONEAL, JP DRAINAGE): Creat, Fluid: 1.8 mg/dL

## 2018-05-13 LAB — CBC WITH DIFFERENTIAL/PLATELET
Abs Immature Granulocytes: 0.05 10*3/uL (ref 0.00–0.07)
Basophils Absolute: 0 10*3/uL (ref 0.0–0.1)
Basophils Relative: 0 %
Eosinophils Absolute: 0.2 10*3/uL (ref 0.0–0.5)
Eosinophils Relative: 2 %
HCT: 26.9 % — ABNORMAL LOW (ref 36.0–46.0)
Hemoglobin: 8.1 g/dL — ABNORMAL LOW (ref 12.0–15.0)
Immature Granulocytes: 1 %
Lymphocytes Relative: 9 %
Lymphs Abs: 1 10*3/uL (ref 0.7–4.0)
MCH: 31.4 pg (ref 26.0–34.0)
MCHC: 30.1 g/dL (ref 30.0–36.0)
MCV: 104.3 fL — AB (ref 80.0–100.0)
MONO ABS: 0.6 10*3/uL (ref 0.1–1.0)
MONOS PCT: 6 %
Neutro Abs: 9 10*3/uL — ABNORMAL HIGH (ref 1.7–7.7)
Neutrophils Relative %: 82 %
Platelets: 273 10*3/uL (ref 150–400)
RBC: 2.58 MIL/uL — ABNORMAL LOW (ref 3.87–5.11)
RDW: 12.9 % (ref 11.5–15.5)
WBC: 10.9 10*3/uL — ABNORMAL HIGH (ref 4.0–10.5)
nRBC: 0 % (ref 0.0–0.2)

## 2018-05-13 LAB — RETICULOCYTES
Immature Retic Fract: 9.8 % (ref 2.3–15.9)
RBC.: 2.58 MIL/uL — ABNORMAL LOW (ref 3.87–5.11)
Retic Count, Absolute: 55.2 10*3/uL (ref 19.0–186.0)
Retic Ct Pct: 2.1 % (ref 0.4–3.1)

## 2018-05-13 LAB — FERRITIN: Ferritin: 381 ng/mL — ABNORMAL HIGH (ref 11–307)

## 2018-05-13 LAB — IRON AND TIBC
Iron: 17 ug/dL — ABNORMAL LOW (ref 28–170)
Saturation Ratios: 7 % — ABNORMAL LOW (ref 10.4–31.8)
TIBC: 243 ug/dL — ABNORMAL LOW (ref 250–450)
UIBC: 226 ug/dL

## 2018-05-13 LAB — PROTIME-INR
INR: 0.9 (ref 0.8–1.2)
Prothrombin Time: 12.5 seconds (ref 11.4–15.2)

## 2018-05-13 LAB — VITAMIN B12: Vitamin B-12: 520 pg/mL (ref 180–914)

## 2018-05-13 LAB — TSH: TSH: 1.251 u[IU]/mL (ref 0.350–4.500)

## 2018-05-13 LAB — SEDIMENTATION RATE: Sed Rate: 64 mm/hr — ABNORMAL HIGH (ref 0–22)

## 2018-05-13 LAB — FIBRINOGEN: Fibrinogen: 604 mg/dL — ABNORMAL HIGH (ref 210–475)

## 2018-05-13 MED ORDER — BISACODYL 10 MG RE SUPP
10.0000 mg | Freq: Every day | RECTAL | Status: DC | PRN
  Administered 2018-05-14: 10 mg via RECTAL
  Filled 2018-05-13: qty 1

## 2018-05-13 MED ORDER — SODIUM CHLORIDE 0.9 % IV SOLN
510.0000 mg | Freq: Once | INTRAVENOUS | Status: AC
  Administered 2018-05-13: 510 mg via INTRAVENOUS
  Filled 2018-05-13 (×2): qty 17

## 2018-05-13 MED ORDER — POLYETHYLENE GLYCOL 3350 17 G PO PACK
17.0000 g | PACK | Freq: Every day | ORAL | Status: DC
  Administered 2018-05-13 – 2018-05-16 (×4): 17 g via ORAL
  Filled 2018-05-13 (×4): qty 1

## 2018-05-13 NOTE — Consult Note (Signed)
[facility] Initial Consultation Note   Patient Name: [name]    RJJ:[id number] PCP: Kathyrn Drown, MD     DOB: [birth year]  DOA: 05/11/2018 DOS: the patient was seen and examined on 05/13/2018   Referring physician: Dr. Lovena Neighbours Reason for consult: Sinus tachycardia and anemia  HPI: [name] is a 85 y.o. female with Past medical history of right breast cancer, lung nodule, urothelial bladder cancer with hematuria, COPD, HTN, HLD, chronic kidney disease stage III. Patient is coming from home. The patient was recently diagnosed for urothelial bladder cancer on was admitted for robotic laparoscopic ureterectomy, pelvic lymph node dissection and left ureteral stent placement. Patient tolerated this procedure well but was found to have anemia which is progressively worsening. Patient denied any complaints of nausea or vomiting, no chest pain or shortness of breath reported. Patient reports feeling severely fatigued and tired. She is not passing gas and has not had a bowel movement since arrival. She reports no pain whatsoever. Patient did have some hematuria which is how they were diagnosed with having a bladder cancer. This is been ongoing since October 2019. Patient was diagnosed with anemia outpatient and was following up with her primary oncologist who see has been seen for breast cancer. Patient has received 2 Feraheme injection outpatient in last 2 months. B12 was relatively low. Patient had a colonoscopy when she was 85 year old and was told she will not require further colonoscopy. She reports no history of melena, GI bleed, BRBPR. She has seen nephrologist once but has never received EPO.  Review of Systems: as mentioned in the history of present illness.  All other systems reviewed and are negative.  Past Medical History:  Diagnosis Date  . Breast cancer ([place])   . Breast cancer, right breast ([place]) 05/01/2015  . Cancer (H[facility])    BREAST CANCER   . Colon  polyps   . COPD (chronic obstructive pulmonary disease) ([place])    DR. [place]  . Diabetes mellitus without complication ([place])    PREDIABETIC  CONTROL W/ DIET  . Hypercholesteremia   . Hypertension   . Lung nodules   . Osteoarthritis   . Osteopenia 09/18/2013  . Osteoporosis   . Personal history of radiation therapy   . PONV (postoperative nausea and vomiting)    ' i get so sick with anesthesia"   . Shortness of breath dyspnea    W/ EXERTION  . Tick bite    3 years ago    Past Surgical History:  Procedure Laterality Date  . ABDOMINAL HYSTERECTOMY    . APPENDECTOMY    . BARTHOLIN GLAND CYST EXCISION    . BREAST EXCISIONAL BIOPSY Left    benign  . BREAST LUMPECTOMY WITH RADIOACTIVE SEED LOCALIZATION Right 05/23/2015   Procedure: RIGHT BREAST LUMPECTOMY WITH RADIOACTIVE SEED LOCALIZATION;  Surgeon: Fanny Skates, MD;  Location: [place];  Service: General;  Laterality: Right;  . BREAST SURGERY     2000  LEFT BREAST  LUMP REMOVED  . cataract surgeries  Bilateral   . colonoscopy    . COLONOSCOPY  03/25/2011   Procedure: COLONOSCOPY;  Surgeon: Rogene Houston, MD;  [facility];  Service: Endoscopy;  Laterality: N/A;  9:30 am  . CYSTOSCOPY W/ URETERAL STENT PLACEMENT Left 02/27/2018   Procedure: CYSTOSCOPY WITH LEFT RETROGRADE PYELOGRAM AND lEFT URETEROSCOPY WITH BIOPSY AND LEFT URETERAL STENT PLACEMENT;  Surgeon: Cleon Gustin, MD;  Location: [facility];  Service: Urology;  Laterality: Left;  .  left inguinal hernia repair    . multiple toe surgeries    . ROBOT ASSITED LAPAROSCOPIC NEPHROURETERECTOMY Left 05/11/2018   Procedure: XI ROBOT ASSITED LAPAROSCOPIC LEFT DISTAL URETERECTOMY, URETERAL  REIMPLANTATION, WITH PSOAS HITCH AND BOARI FLAP. PELVIC LYMPH NODE DISSCETION AND LEFT URETERAL STENT PLACEMENT;  Surgeon: Cleon Gustin, MD;  Location: [facility];  Service: Urology;  Laterality: Left;  . TONSILLECTOMY     Social History:  reports that she quit smoking about 30 years  ago. Her smoking use included cigarettes. She has a 100.00 pack-year smoking history. She has never used smokeless tobacco. She reports current alcohol use. She reports that she does not use drugs.  Allergies  Allergen Reactions  . Augmentin [Amoxicillin-Pot Clavulanate] Nausea And Vomiting    Has patient had a PCN reaction causing immediate rash, facial/tongue/throat swelling, SOB or lightheadedness with hypotension: No Has patient had a PCN reaction causing severe rash involving mucus membranes or skin necrosis: No Has patient had a PCN reaction that required hospitalization: No Has patient had a PCN reaction occurring within the last 10 years: Unknown If all of the above answers are "NO", then may proceed with Cephalosporin use.    Marland Kitchen Amoxicillin Diarrhea    Has patient had a PCN reaction causing immediate rash, facial/tongue/throat swelling, SOB or lightheadedness with hypotension: No Has patient had a PCN reaction causing severe rash involving mucus membranes or skin necrosis: No Has patient had a PCN reaction that required hospitalization: No Has patient had a PCN reaction occurring within the last 10 years: Unknown If all of the above answers are "NO", then may proceed with Cephalosporin use.   . Codeine Nausea And Vomiting  . Morphine Nausea And Vomiting  . Neomycin-Bacitracin Zn-Polymyx Itching and Rash    Unable to use Neosporin    Family History  Problem Relation Age of Onset  . Colon cancer Father     Prior to Admission medications   Medication Sig Start Date End Date Taking? Authorizing Provider  amLODipine (NORVASC) 5 MG tablet Take 1 tablet (5 mg total) by mouth daily. 03/23/18  Yes Kathyrn Drown, MD  anastrozole (ARIMIDEX) 1 MG tablet Take 1 tablet (1 mg total) by mouth daily. Patient taking differently: Take 1 mg by mouth every evening.  11/04/17  Yes Derek Jack, MD  fluticasone [facility]) 50 MCG/ACT nasal spray Place 2 sprays into both nostrils daily as  needed for allergies.    Yes [provider]  furosemide (LASIX) 20 MG tablet Take 1 tablet (20 mg total) by mouth daily. 03/28/18  Yes Luking, Elayne Snare, MD  Multiple Vitamins-Minerals (CENTRUM SILVER 50+WOMEN) TABS Take 1 tablet by mouth daily.   Yes [provider]  nortriptyline (PAMELOR) 10 MG capsule Take 3 capsules (30 mg total) by mouth at bedtime. Take 2-3 at night Patient taking differently: Take 20-30 mg by mouth at bedtime. Take 20-30 mg by mouth at bedtime (dosage varies based on pain level) 12/26/17 06/24/18 Yes Luking, Elayne Snare, MD  OXYGEN Inhale 2 L into the lungs. As needed for activities requiring exertion, per patient uses very sparingly   Yes [provider]  polyethylene glycol (MIRALAX / GLYCOLAX) packet Take 17 g by mouth daily as needed for moderate constipation.    Yes [provider]  predniSONE (DELTASONE) 20 MG tablet Take 1 tablet (20 mg total) by mouth daily with breakfast. 04/20/18  Yes Luking, Elayne Snare, MD  PROAIR RESPICLICK 510 (90 Base) MCG/ACT AEPB INHALE 1 PUFF INTO  THE LUNGS EVERY 4 HOURS AS NEEDED. Patient taking differently: Inhale 1 puff into the lungs every 4 (four) hours as needed (shortness of breath or wheezing).  03/24/18  Yes McQuaid, Ronie Spies, MD  STIOLTO RESPIMAT 2.5-2.5 MCG/ACT AERS INHALE 2 PUFFS INTO THE LUNGS ONCE DAILY. Patient taking differently: Inhale 2 puffs into the lungs daily.  12/22/17  Yes Juanito Doom, MD  traMADol (ULTRAM) 50 MG tablet Take 1-2 tablets (50-100 mg total) by mouth every 6 (six) hours as needed for moderate pain or severe pain. 05/11/18   Debbrah Alar, PA-C  triamcinolone cream (KENALOG) 0.1 % Apply 1 application topically 2 (two) times daily. Patient not taking: Reported on 04/28/2018 04/20/18   Kathyrn Drown, MD    Physical Exam: Vitals:   05/12/18 2207 05/13/18 0600 05/13/18 0848 05/13/18 1243  BP: (!) 150/65 (!) 153/72 (!) 154/69 (!) 145/72  Pulse: (!) 102 98 100 98  Resp: '20 20 20  19  '$ Temp: 98 F (36.7 C) 98.1 F (36.7 C) 97.9 F (36.6 C) 98 F (36.7 C)  TempSrc: Oral Oral Oral Oral  SpO2: 100% 98% 100% 92%  Weight:      Height:        General: Alert, Awake and Oriented to Time, Place and Person. Appear in mild distress, affect appropriate Eyes: PERRL, Conjunctiva normal ENT: Oral Mucosa clear moist. Neck: no JVD, no Abnormal Mass Or lumps Cardiovascular: S1 and S2 Present, no Murmur, Peripheral Pulses Present Respiratory: Bilateral Air entry equal and Decreased, no use of accessory muscle, Clear to Auscultation, no Crackles, no wheezes Abdomen: Bowel Sound present, Soft and no tenderness Skin: redness no, no Rash, no induration Extremities: no Pedal edema, no calf tenderness Neurologic: Grossly no focal neuro deficit. Bilaterally Equal motor strength  Labs:  CBC: Recent Labs  Lab 05/08/18 1141 05/11/18 0705 05/11/18 1320 05/12/18 0356 05/13/18 0431 05/13/18 1136  WBC 7.7  --   --   --  10.1 10.9*  NEUTROABS  --   --   --   --   --  9.0*  HGB 8.4* 8.2* 8.0* 7.4* 7.3* 8.1*  HCT 27.7* 24.0* 26.2* 23.7* 24.7* 26.9*  MCV 103.7*  --   --   --  104.7* 104.3*  PLT 231  --   --   --  237 824   Basic Metabolic Panel: Recent Labs  Lab 05/08/18 1141 05/11/18 0705 05/12/18 0356  NA 144 138 139  K 3.0* 3.6 3.9  CL 106  --  104  CO2 28  --  28  GLUCOSE 112*  --  141*  BUN 27*  --  28*  CREATININE 1.97*  --  1.92*  CALCIUM 9.7  --  8.7*   Liver Function Tests: No results for input(s): AST, ALT, ALKPHOS, BILITOT, PROT, ALBUMIN in the last 168 hours. No results for input(s): LIPASE, AMYLASE in the last 168 hours. No results for input(s): AMMONIA in the last 168 hours.  Cardiac Enzymes: No results for input(s): CKTOTAL, CKMB, CKMBINDEX, TROPONINI in the last 168 hours. No results for input(s): PROBNP in the last 8760 hours.  CBG: No results for input(s): GLUCAP in the last 168 hours.  Radiological Exams: No results  found.  Assessment/Plan 1.  Anemia. Likely secondary to chronic blood loss from hematuria as well as anemia of of chronic kidney disease. No active bleeding reported here in the hospital. The drop in the hospital is likely secondary to dilution since the patient has been receiving  IV hydration since admission. I have checked some labs. Iron is 17 suggesting iron deficiency. B12 level is 520 which is adequate. Repeat hemoglobin is 8.1. Platelets are stable. Reticulocyte count is normal suggesting no active major blood loss. INR is normal. Fibrinogen is 604 which helps to rule out hemolysis. TSH is normal and ESR elevated as anticipated and patient was recently undergone surgery. Initial plan was to transfuse the patient for hemoglobin of 7.3 although when her recheck is 8.1.plan was canceled. I will provide patient another dose of IV Feraheme. Monitor daily CBC.  2.  Sinus tachycardia. Suspect this is actually still in secondary to anemia. If her symptoms continues to worsen and she still feels fatigue and tired she may require a transfusion since her hemoglobin at baseline is around 10. Continue pain control. Normal TSH.  3.  Constipation. Ileus. Postop ileus, diet has been advanced. We will add laxative and stool softener and monitor. Suppository also added.  4.  Essential hypertension. Patient is on Norvasc and Lasix. Currently also receiving IV hydration. Would recommend to consider stopping Lasix and IV hydration when appropriate.  5.  History of COPD. Pulmonary nodule. Continue outpatient follow-up by PCP.  6.  History of mood disorder. Continue Pamelor.  Family Communication: none  Primary team communication: Discussed back with Dr. Lovena Neighbours.  Thank you very much for involving [place] in care of your patient.  We will continue to follow the patient.   Author: Berle Mull, MD [facility] 05/13/2018 8:26 PM    If 7PM-7AM, please contact  night-coverage [email or web address]

## 2018-05-13 NOTE — Progress Notes (Signed)
2 Days Post-Op Subjective: No acute events overnight.  Pain controlled.  She had some mild nausea last night, but states that she is feeling hungry this morning.  She is passing flatus, but has not had a bowel movement over the past shift.  Good urine output and minimal JP drain output overnight.  She continues to be anemic, but is at baseline.  She denies shortness of breath, but is slightly tachycardic this morning.  Blood pressure remained stable.  Urine output appropriate and renal function is at baseline. Objective: Vital signs in last 24 hours: Temp:  [97.9 F (36.6 C)-98.2 F (36.8 C)] 97.9 F (36.6 C) (02/29 0848) Pulse Rate:  [98-102] 100 (02/29 0848) Resp:  [16-20] 20 (02/29 0848) BP: (148-154)/(65-72) 154/69 (02/29 0848) SpO2:  [97 %-100 %] 100 % (02/29 0848)  Intake/Output from previous day: 02/28 0701 - 02/29 0700 In: 840 [P.O.:240; I.V.:600] Out: 2840 [Urine:2745; Drains:95]  Intake/Output this shift: No intake/output data recorded.  Physical Exam:  General: Alert and oriented CV: RRR, palpable distal pulses Lungs: CTAB, equal chest rise Abdomen: Soft, NTND, no rebound or guarding, JP drain with serosanguineous output Incisions: Clean, dry and intact Gu: Foley catheter in place and draining clear-yellow urine Ext: NT, No erythema  Lab Results: Recent Labs    05/11/18 1320 05/12/18 0356 05/13/18 0431  HGB 8.0* 7.4* 7.3*  HCT 26.2* 23.7* 24.7*   BMET Recent Labs    05/11/18 0705 05/12/18 0356  NA 138 139  K 3.6 3.9  CL  --  104  CO2  --  28  GLUCOSE  --  141*  BUN  --  28*  CREATININE  --  1.92*  CALCIUM  --  8.7*     Studies/Results: No results found.  Assessment/Plan: Postop day 2 status post assisted laparoscopic left distal ureterectomy with psoas hitch and Boari flap  -Advance diet as tolerated.   -JP creatinine pending, if normal, will likely DC as this could be contributing to her nausea.  Minimal output over the past 2 shifts. -Out  of bed to chair and ambulate -The patient requested to have her anemia evaluated while in the hospital. -Will continue to monitor   LOS: 2 days   Ellison Hughs, MD [facility] Pager: [phone]  05/13/2018, 9:35 AM

## 2018-05-14 LAB — CBC
HCT: 24.1 % — ABNORMAL LOW (ref 36.0–46.0)
HEMOGLOBIN: 7.4 g/dL — AB (ref 12.0–15.0)
MCH: 31.8 pg (ref 26.0–34.0)
MCHC: 30.7 g/dL (ref 30.0–36.0)
MCV: 103.4 fL — ABNORMAL HIGH (ref 80.0–100.0)
NRBC: 0 % (ref 0.0–0.2)
Platelets: 206 10*3/uL (ref 150–400)
RBC: 2.33 MIL/uL — AB (ref 3.87–5.11)
RDW: 12.9 % (ref 11.5–15.5)
WBC: 8.5 10*3/uL (ref 4.0–10.5)

## 2018-05-14 LAB — TECHNOLOGIST SMEAR REVIEW

## 2018-05-14 NOTE — Progress Notes (Signed)
3 Days Post-Op Subjective: No AE overnight.  She tolerated regular diet yesterday, but had a small amount of nausea.  She is passing flatus but is not had a bowel movement yet.  She is still not very mobile.  Objective: Vital signs in last 24 hours: Temp:  [98 F (36.7 C)-98.1 F (36.7 C)] 98.1 F (36.7 C) (03/01 0937) Pulse Rate:  [97-100] 98 (03/01 0937) Resp:  [16-20] 20 (03/01 0937) BP: (121-147)/(59-72) 130/65 (03/01 0937) SpO2:  [87 %-94 %] 87 % (03/01 0937)  Intake/Output from previous day: 02/29 0701 - 03/01 0700 In: 1255.7 [P.O.:480; I.V.:775.7] Out: 2215 [Urine:1700; Drains:515]  Intake/Output this shift: No intake/output data recorded.  Physical Exam:  General: Alert and oriented CV: RRR, palpable distal pulses Lungs: CTAB, equal chest rise Abdomen: Soft, NTND, no rebound or guarding, JP drain in place with serous output Incisions: Clean, dry and intact Gu: Foley draining clear-yellow urine Ext: NT, No erythema  Lab Results: Recent Labs    05/13/18 0431 05/13/18 1136 05/14/18 0456  HGB 7.3* 8.1* 7.4*  HCT 24.7* 26.9* 24.1*   BMET Recent Labs    05/12/18 0356  NA 139  K 3.9  CL 104  CO2 28  GLUCOSE 141*  BUN 28*  CREATININE 1.92*  CALCIUM 8.7*     Studies/Results: No results found.  Assessment/Plan: 1.  Postop day 3 status post assisted laparoscopic left distal ureterectomy with psoas hitch and Boari flap -d/c JP drain (drain creatinine 1.8) -OOBTC and ambulate.  PT consult pending- the patient lives alone and has no assistance.  She does not want home health.  -ADAT -Keep Foley in place -Likely home tomorrow  2.  Chronic anemia-transfused with Feraheme yesterday by the Dr. Posey Pronto.  H/H stable.  LOS: 3 days   Ellison Hughs, MD [facility] Pager: [phone]  05/14/2018, 9:38 AM

## 2018-05-14 NOTE — Progress Notes (Addendum)
SATURATION QUALIFICATIONS: (This note is used to comply with regulatory documentation for home oxygen)  Patient Saturations on Room Air at Rest = 87%  Patient Saturations on Room Air while Ambulating = 86%  Patient Saturations on 2 Liters of oxygen while Ambulating = 95%  Please briefly explain why patient needs home oxygen: Patient ambulated in the hallway 150 feet. Tolerated well. Sitting up in chair at this time, using IS frequently. Pt reports already having home O2, uses 2L/[name] with exertion.

## 2018-05-14 NOTE — Progress Notes (Signed)
PROGRESS NOTE    [name]  HGD:[id number] DOB: [birth year] DOA: 05/11/2018 PCP: Kathyrn Drown, MD   Brief Narrative:  [name] is a 85 y.o. female with Past medical history of right breast cancer, lung nodule, urothelial bladder cancer with hematuria, COPD, HTN, HLD, chronic kidney disease stage III. The patient was recently diagnosed for urothelial bladder cancer on was admitted for robotic laparoscopic ureterectomy, pelvic lymph node dissection and left ureteral stent placement. Patient tolerated this procedure well but was found to have anemia which is progressively worsening.  Assessment & Plan:   Active Problems:   Ureteral mass Anemia. Likely secondary to chronic blood loss from hematuria as well as anemia of of chronic kidney disease. No active bleeding reported here in the hospital. The drop in the hospital is likely secondary to dilution since the patient has been receiving IV hydration since admission. Initial plan was to transfuse the patient for hemoglobin of 7.3 although when her recheck is 8.1.plan was canceled. Monitor daily CBC.  2.  Sinus tachycardia. Suspect this is actually still in secondary to anemia. If her symptoms continues to worsen and she still feels fatigue and tired she may require a transfusion since her hemoglobin at baseline is around 10. Continue pain control. Normal TSH.  3.  Constipation. Ileus. Postop ileus, diet has been advanced. We will add laxative and stool softener and monitor. Suppository also added.  4.  Essential hypertension. Patient is on Norvasc and Lasix. Currently also receiving IV hydration. Would recommend to consider stopping Lasix and IV hydration when appropriate.  5.  History of COPD. Pulmonary nodule. Continue outpatient follow-up by PCP.  6.  History of mood disorder. Continue Pamelor.     DVT prophylaxis: scd's   Subjective: No chest pain or sob.    Objective: Vitals:   05/14/18 0436 05/14/18 0937 05/14/18 0942 05/14/18 1359  BP: (!) 121/59 130/65  139/74  Pulse: 100 98  98  Resp: 16 20  20   Temp: 98.1 F (36.7 C) 98.1 F (36.7 C)  98.2 F (36.8 C)  TempSrc: Oral Oral  Oral  SpO2: (!) 89% (!) 87% 95% 99%  Weight:      Height:        Intake/Output Summary (Last 24 hours) at 05/14/2018 1841 Last data filed at 05/14/2018 1809 Gross per 24 hour  Intake 1015.66 ml  Output 1750 ml  Net -734.34 ml   Filed Weights   05/11/18 9622  Weight: 71.7 kg    Examination:  General exam: Appears calm and comfortable  Respiratory system: Clear to auscultation. Respiratory effort normal. Cardiovascular system: S1 & S2 heard, RRR. No JVD, murmurs, rubs, gallops or clicks. No pedal edema. Gastrointestinal system: Abdomen is nondistended, soft and nontender. No organomegaly or masses felt. Normal bowel sounds heard. Central nervous system: Alert and oriented. No focal neurological deficits. Extremities: Symmetric 5 x 5 power. Skin: No rashes, lesions or ulcers Psychiatry: Judgement and insight appear normal. Mood & affect appropriate.     Data Reviewed: I have personally reviewed following labs and imaging studies  CBC: Recent Labs  Lab 05/08/18 1141  05/11/18 1320 05/12/18 0356 05/13/18 0431 05/13/18 1136 05/14/18 0456  WBC 7.7  --   --   --  10.1 10.9* 8.5  NEUTROABS  --   --   --   --   --  9.0*  --   HGB 8.4*   < > 8.0* 7.4* 7.3* 8.1* 7.4*  HCT 27.7*   < >  26.2* 23.7* 24.7* 26.9* 24.1*  MCV 103.7*  --   --   --  104.7* 104.3* 103.4*  PLT 231  --   --   --  237 273 206   < > = values in this interval not displayed.   Basic Metabolic Panel: Recent Labs  Lab 05/08/18 1141 05/11/18 0705 05/12/18 0356  NA 144 138 139  K 3.0* 3.6 3.9  CL 106  --  104  CO2 28  --  28  GLUCOSE 112*  --  141*  BUN 27*  --  28*  CREATININE 1.97*  --  1.92*  CALCIUM 9.7  --  8.7*   GFR: Estimated Creatinine Clearance: 19.1 mL/min (A) (by C-G formula based on  SCr of 1.92 mg/dL (H)). Liver Function Tests: No results for input(s): AST, ALT, ALKPHOS, BILITOT, PROT, ALBUMIN in the last 168 hours. No results for input(s): LIPASE, AMYLASE in the last 168 hours. No results for input(s): AMMONIA in the last 168 hours. Coagulation Profile: Recent Labs  Lab 05/13/18 1136  INR 0.9   Cardiac Enzymes: No results for input(s): CKTOTAL, CKMB, CKMBINDEX, TROPONINI in the last 168 hours. BNP (last 3 results) No results for input(s): PROBNP in the last 8760 hours. HbA1C: No results for input(s): HGBA1C in the last 72 hours. CBG: No results for input(s): GLUCAP in the last 168 hours. Lipid Profile: No results for input(s): CHOL, HDL, LDLCALC, TRIG, CHOLHDL, LDLDIRECT in the last 72 hours. Thyroid Function Tests: Recent Labs    05/13/18 1136  TSH 1.251   Anemia Panel: Recent Labs    05/13/18 1136  VITAMINB12 520  FERRITIN 381*  TIBC 243*  IRON 17*  RETICCTPCT 2.1   Sepsis Labs: No results for input(s): PROCALCITON, LATICACIDVEN in the last 168 hours.  No results found for this or any previous visit (from the past 240 hour(s)).       Radiology Studies: No results found.      Scheduled Meds: . amLODipine  5 mg Oral Daily  . anastrozole  1 mg Oral QPM  . docusate sodium  100 mg Oral BID  . furosemide  20 mg Oral Daily  . nortriptyline  20 mg Oral QHS  . polyethylene glycol  17 g Oral Daily   Continuous Infusions: . dextrose 5 % and 0.45% NaCl Stopped (05/14/18 0745)     LOS: 3 days      Hosie Poisson, MD [facility]   If 7PM-7AM, please contact night-coverage [email or web address] Password [facility] 05/14/2018, 6:41 PM

## 2018-05-15 LAB — ERYTHROPOIETIN: Erythropoietin: 21.3 m[IU]/mL — ABNORMAL HIGH (ref 2.6–18.5)

## 2018-05-15 LAB — CBC
HCT: 23.8 % — ABNORMAL LOW (ref 36.0–46.0)
Hemoglobin: 7.3 g/dL — ABNORMAL LOW (ref 12.0–15.0)
MCH: 31.6 pg (ref 26.0–34.0)
MCHC: 30.7 g/dL (ref 30.0–36.0)
MCV: 103 fL — ABNORMAL HIGH (ref 80.0–100.0)
Platelets: 245 10*3/uL (ref 150–400)
RBC: 2.31 MIL/uL — ABNORMAL LOW (ref 3.87–5.11)
RDW: 12.8 % (ref 11.5–15.5)
WBC: 7 10*3/uL (ref 4.0–10.5)
nRBC: 0 % (ref 0.0–0.2)

## 2018-05-15 LAB — BASIC METABOLIC PANEL
Anion gap: 9 (ref 5–15)
BUN: 26 mg/dL — ABNORMAL HIGH (ref 8–23)
CO2: 30 mmol/L (ref 22–32)
Calcium: 9.3 mg/dL (ref 8.9–10.3)
Chloride: 101 mmol/L (ref 98–111)
Creatinine, Ser: 1.86 mg/dL — ABNORMAL HIGH (ref 0.44–1.00)
GFR calc Af Amer: 28 mL/min — ABNORMAL LOW (ref >60)
GFR calc non Af Amer: 24 mL/min — ABNORMAL LOW (ref >60)
Glucose, Bld: 105 mg/dL — ABNORMAL HIGH (ref 70–99)
POTASSIUM: 3 mmol/L — AB (ref 3.5–5.1)
Sodium: 140 mmol/L (ref 135–145)

## 2018-05-15 LAB — HAPTOGLOBIN: Haptoglobin: 281 mg/dL (ref 41–333)

## 2018-05-15 LAB — PREPARE RBC (CROSSMATCH)

## 2018-05-15 MED ORDER — SODIUM CHLORIDE 0.9% IV SOLUTION
Freq: Once | INTRAVENOUS | Status: AC
  Administered 2018-05-15: 14:00:00 via INTRAVENOUS

## 2018-05-15 MED ORDER — DIPHENHYDRAMINE HCL 25 MG PO CAPS: 25.0000 mg | ORAL_CAPSULE | Freq: Once | ORAL | Status: DC

## 2018-05-15 MED ORDER — SODIUM CHLORIDE 0.9% IV SOLUTION: 250.0000 mL | Freq: Once | INTRAVENOUS | Status: AC

## 2018-05-15 MED ORDER — ACETAMINOPHEN 325 MG PO TABS: 650.0000 mg | ORAL_TABLET | Freq: Once | ORAL | Status: DC

## 2018-05-15 NOTE — Evaluation (Signed)
Physical Therapy Evaluation Patient Details Name: [name] MRN: [id number] DOB: [birth year] Today's Date: 05/15/2018   History of Present Illness   85 y.o. female with PMH of breast cancer, lung nodule, urothelial bladder cancer with hematuria, COPD, HTN, HLD, chronic kidney disease stage III. The patient was recently diagnosed for urothelial bladder cancer on was admitted 05/11/18 for robotic laparoscopic ureterectomy, pelvic lymph node dissection and left ureteral stent placement. Post op anemia.   Clinical Impression  Pt admitted with above diagnosis. Pt currently with functional limitations due to the deficits listed below (see PT Problem List). Pt ambulated 200' with RW, no loss of balance, supervision for safe use of RW. She is independent with bed mobility and transfers. Good progress expected. Pt will benefit from skilled PT to increase their independence and safety with mobility to allow discharge to the venue listed below.       Follow Up Recommendations No PT follow up    Equipment Recommendations  Rolling walker with 5" wheels    Recommendations for Other Services       Precautions / Restrictions Precautions Precautions: None Precaution Comments: pt denies h/o falls in past 1 year Restrictions Weight Bearing Restrictions: No      Mobility  Bed Mobility Overal bed mobility: Modified Independent             General bed mobility comments: log roll to R, used rail to push up to sit  Transfers Overall transfer level: Needs assistance Equipment used: Rolling walker (2 wheeled) Transfers: Sit to/from Stand Sit to Stand: Independent            Ambulation/Gait Ambulation/Gait assistance: Supervision Gait Distance (Feet): 200 Feet Assistive device: Rolling walker (2 wheeled) Gait Pattern/deviations: Step-through pattern Gait velocity: WNL   General Gait Details: VCs for positioning in RW, no loss of balance  Stairs            Wheelchair  Mobility    Modified Rankin (Stroke Patients Only)       Balance Overall balance assessment: Modified Independent                                           Pertinent Vitals/Pain Pain Assessment: No/denies pain    Home Living Family/patient expects to be discharged to:: Private residence Living Arrangements: Alone   Type of Home: House Home Access: Stairs to enter   Technical brewer of Steps: 5 Home Layout: One level Home Equipment: Cane - single point      Prior Function Level of Independence: Independent with assistive device(s)         Comments: uses SPC, drives, does yardwork     Hand Dominance        Extremity/Trunk Assessment   Upper Extremity Assessment Upper Extremity Assessment: Overall WFL for tasks assessed    Lower Extremity Assessment Lower Extremity Assessment: Overall WFL for tasks assessed    Cervical / Trunk Assessment Cervical / Trunk Assessment: Normal  Communication   Communication: No difficulties  Cognition Arousal/Alertness: Awake/alert Behavior During Therapy: WFL for tasks assessed/performed Overall Cognitive Status: Within Functional Limits for tasks assessed                                        General Comments      Exercises  Assessment/Plan    PT Assessment Patient needs continued PT services  PT Problem List Decreased knowledge of use of DME;Decreased mobility       PT Treatment Interventions Gait training;DME instruction    PT Goals (Current goals can be found in the Care Plan section)  Acute Rehab PT Goals Patient Stated Goal: yard work, housework PT Goal Formulation: With patient Time For Goal Achievement: 05/29/18 Potential to Achieve Goals: Good    Frequency Min 3X/week   Barriers to discharge        Co-evaluation               AM-PAC PT "6 Clicks" Mobility  Outcome Measure Help needed turning from your back to your side while in a flat bed  without using bedrails?: None Help needed moving from lying on your back to sitting on the side of a flat bed without using bedrails?: A Little Help needed moving to and from a bed to a chair (including a wheelchair)?: None Help needed standing up from a chair using your arms (e.g., wheelchair or bedside chair)?: None Help needed to walk in hospital room?: A Little Help needed climbing 3-5 steps with a railing? : A Little 6 Click Score: 21    End of Session Equipment Utilized During Treatment: Gait belt Activity Tolerance: Patient tolerated treatment well Patient left: in bed;with call bell/phone within reach(in bed for start of blood transfusion) Nurse Communication: Mobility status PT Visit Diagnosis: Difficulty in walking, not elsewhere classified (R26.2)    Time: 9509-3267 PT Time Calculation (min) (ACUTE ONLY): 16 min   Charges:   PT Evaluation $PT Eval Low Complexity: 1 Low          Philomena Doheny PT 05/15/2018  Acute Rehabilitation Services Pager [phone] Office [phone]

## 2018-05-15 NOTE — Care Management Important Message (Signed)
Important Message  Patient Details  Name: [name] MRN: [id number] Date of Birth: [birth year]   Medicare Important Message Given:  Yes    Kerin Salen 05/15/2018, 11:45 AMImportant Message  Patient Details  Name: [name] MRN: [id number] Date of Birth: 06/06/1928   Medicare Important Message Given:  Yes    Kerin Salen 05/15/2018, 11:45 AM

## 2018-05-15 NOTE — Progress Notes (Signed)
PROGRESS NOTE    [name]  EQA:[id number] DOB: [birth year] DOA: 05/11/2018 PCP: Kathyrn Drown, MD   Brief Narrative:  [name] is a 85 y.o. female with Past medical history of right breast cancer, lung nodule, urothelial bladder cancer with hematuria, COPD, HTN, HLD, chronic kidney disease stage III. The patient was recently diagnosed for urothelial bladder cancer on was admitted for robotic laparoscopic ureterectomy, pelvic lymph node dissection and left ureteral stent placement. Patient tolerated this procedure well but was found to have anemia which is progressively worsening.  Assessment & Plan:   Active Problems:   Ureteral mass   Anemia. Likely secondary to chronic blood loss from hematuria as well as anemia of of chronic kidney disease. No active bleeding reported here in the hospital. 1 unit of prbc transfusion ordered, repeat H&H post transfusion.   Sinus tachycardia. Suspect this is actually still in secondary to anemia and pain. Continue pain control. Normal TSH.   Constipation. Ileus. Postop ileus, diet has been advanced. We [name] add laxative and stool softener and monitor. Suppository also added.    Essential hypertension. Patient is on Norvasc and Lasix. Well controlled.    History of COPD. Pulmonary nodule. Continue outpatient follow-up by PCP.    History of mood disorder. Continue Pamelor.     DVT prophylaxis: scd's   Subjective: No chest pain or sob.    Objective: Vitals:   05/14/18 2113 05/15/18 0441 05/15/18 1310 05/15/18 1336  BP: (!) 115/58 131/77 (!) 148/73 (!) 147/61  Pulse: (!) 102 96 99 98  Resp: 18 18 16 15   Temp: 99 F (37.2 C) 97.8 F (36.6 C) 98.2 F (36.8 C) 98.1 F (36.7 C)  TempSrc: Oral Oral Oral Oral  SpO2: 100% 100% 93% 95%  Weight:      Height:        Intake/Output Summary (Last 24 hours) at 05/15/2018 1552 Last data filed at 05/15/2018 1500 Gross per 24 hour  Intake 490 ml  Output  2075 ml  Net -1585 ml   Filed Weights   05/11/18 0638  Weight: 71.7 kg    Examination:  General exam: Appears calm and comfortable  Respiratory system: diminished at bases.  Cardiovascular system: S1 & S2 heard, RRR.  Gastrointestinal system: Abdomen is soft non tender , JP drain in place.  Central nervous system: Alert and oriented. No focal neurological deficits. Extremities: Symmetric 5 x 5 power. Skin: No rashes, lesions or ulcers Psychiatry:  Mood & affect appropriate.     Data Reviewed: I have personally reviewed following labs and imaging studies  CBC: Recent Labs  Lab 05/12/18 0356 05/13/18 0431 05/13/18 1136 05/14/18 0456 05/15/18 0416  WBC  --  10.1 10.9* 8.5 7.0  NEUTROABS  --   --  9.0*  --   --   HGB 7.4* 7.3* 8.1* 7.4* 7.3*  HCT 23.7* 24.7* 26.9* 24.1* 23.8*  MCV  --  104.7* 104.3* 103.4* 103.0*  PLT  --  237 273 206 979   Basic Metabolic Panel: Recent Labs  Lab 05/11/18 0705 05/12/18 0356 05/15/18 1336  NA 138 139 140  K 3.6 3.9 3.0*  CL  --  104 101  CO2  --  28 30  GLUCOSE  --  141* 105*  BUN  --  28* 26*  CREATININE  --  1.92* 1.86*  CALCIUM  --  8.7* 9.3   GFR: Estimated Creatinine Clearance: 19.7 mL/min (A) (by C-G formula based on SCr of 1.86  mg/dL (H)). Liver Function Tests: No results for input(s): AST, ALT, ALKPHOS, BILITOT, PROT, ALBUMIN in the last 168 hours. No results for input(s): LIPASE, AMYLASE in the last 168 hours. No results for input(s): AMMONIA in the last 168 hours. Coagulation Profile: Recent Labs  Lab 05/13/18 1136  INR 0.9   Cardiac Enzymes: No results for input(s): CKTOTAL, CKMB, CKMBINDEX, TROPONINI in the last 168 hours. BNP (last 3 results) No results for input(s): PROBNP in the last 8760 hours. HbA1C: No results for input(s): HGBA1C in the last 72 hours. CBG: No results for input(s): GLUCAP in the last 168 hours. Lipid Profile: No results for input(s): CHOL, HDL, LDLCALC, TRIG, CHOLHDL, LDLDIRECT in  the last 72 hours. Thyroid Function Tests: Recent Labs    05/13/18 1136  TSH 1.251   Anemia Panel: Recent Labs    05/13/18 1136  VITAMINB12 520  FERRITIN 381*  TIBC 243*  IRON 17*  RETICCTPCT 2.1   Sepsis Labs: No results for input(s): PROCALCITON, LATICACIDVEN in the last 168 hours.  No results found for this or any previous visit (from the past 240 hour(s)).       Radiology Studies: No results found.      Scheduled Meds: . amLODipine  5 mg Oral Daily  . anastrozole  1 mg Oral QPM  . diphenhydrAMINE  25 mg Oral Once  . docusate sodium  100 mg Oral BID  . furosemide  20 mg Oral Daily  . nortriptyline  20 mg Oral QHS  . polyethylene glycol  17 g Oral Daily   Continuous Infusions: . dextrose 5 % and 0.45% NaCl Stopped (05/14/18 0745)     LOS: 4 days      Hosie Poisson, MD [facility]   If 7PM-7AM, please contact night-coverage [email or web address] Password [email or web address] 05/15/2018, 3:52 PM

## 2018-05-15 NOTE — Op Note (Signed)
Preoperative diagnosis: left distal ureteral transitional cell carcinoma  Postop diagnosis: left distal ureteral transitional cell carcinoma, left ectopic ureter   Procedure: 1.  left robot assisted laparoscopic ureteral neocystotomy and Psoas Hitch an Boari flap 2.  Excision of left ectopic ureter 3. Left distal ureteral resection 4. Pelvic lymph node dissection   Attending: Nicolette Bang, MD  Assistant: Debbrah Alar, PA  Anesthesia: General  Estimated blood loss: 100 cc  Drains: 16 French Foley catheter, left 6x26 JJ ureteral stent, JP drain in left lower quadrant  Specimens: left distal ureters, left iliac lymph nodes. Left distal ureteral margin.  Antibiotics: ancef  Findings: left distal ureteral tumor to the level of the iliac vessels. Ectopic bifid left distal ureter with commication with the vagina. No leak at 150cc of water. The assistant was utilized for suction, retraction, and passing suture.  Indications: Patient is a 85 year old with a history of left distal ureteral TCC who was not a candidate for nephrouretetectomy due to poor renal function.   After discussing treatment options patient decided to proceed with left robot assisted laparoscopic ureteral resection and then ureteral  reimplant  Procedure in detail: Prior to procedure consent was obtained. Patient was brought to the operating room and briefing was done sure correct patient, correct procedure, correct site.  General anesthesia was in administered patient was placed in the dorsal lithotomy position.  Her genitalia and abdomen were then prepped and draped in t he usual sterile fashion. a 8 French catheter was then placed.   A Veress needle was used to obtain pneumoperitoneum.  Once pneumoperitoneum was reestablished to 15 mmHg we then placed an 8 mm camera port above the umbilicus.  We then proceeded to place 3 more robotic ports. We then placed an assistant port in the right mid clavicular line. We then  docked the robot.  We then started this dissection by removing a anterior abdominal wall adhesions likely from her previous surgeries.   We then reflected the small bowel and colon medially.  We then identified the psoas muscle and the iliac vessels.  Once this was done we traced it down to the iliac vessels and identified the ureter.  We the dissected the ureter down to the pelvis an noted the ureteral tumor in the distal ureter. We also noted the ureter was duplicated at this point and tracing the second ureter it was noted in implanted into the vagina.  We then clipped the ureter above the tumor.We then dissected the ureters down to their insertion into the vagina and into the bladder. We then excised the ureter from the vagina and then closed the defect with a 3-0 v-lock suture.  We then dissected the ureter tot he bladder and using sharp dissection excised the ureteral orifice, We then closed the bladder in 2 layers with 3-0 v-lock sutures. We then filled the bladder with 150cc of saline and noted no leak. We then excised the ureter below the clip and placed it in an endocatch bag. We then sent the distal ureteral margin which was negative for carcinoma. We then proceeded to dissect the anterior bladder wall and freed the bladder from its lateral and superior attachements. The then proceed to perform a psoas hitch with 3 interrupted 2-0 Vicryls through the left psoas tendon. We then draped the left ureter over the dome of the bladder to approximate where the incision needed to be made in the bladder. The ureter did not reach the dome even with proximal mobilization of the  proximal ureter. We then proceeded to make a Boari flap. We made a 2cm wide and 4cm long flap to reach the ureter.  We then spatulated the ureter. The ureter was then attached to the bladder with 4-0 monocryl in a running fashion. We then closed the bladder in 2 layers with 3-0 v-lock sutures. The anastomosis was then tested an no leak was  seen. We then removed the left lateral roboti port and placed a JP drain into the pelvis. We then removed the remaining ports. We then closed the camera and assistant ports with 0 Vicryl in interrupted fashion. The skin was then closed with 4-0 Monocryl. Skin glue was then placed over the incision and this then concluded the procedure which was well tolerated by the patient.  Complications: None  Condition: Stable, x-rayed, transferred to PACU.  Plan: Patient is to be admitted for inpatient stay. The foley catheter will be removed in 2 weeks and she will have a cystogram prior to foley removal. Stent removed in 6 weeks. They will be started on a clear liquid diet POD#1  .

## 2018-05-15 NOTE — Progress Notes (Signed)
4 Days Post-Op Subjective: No events overnihgt. Mild abdominal pain. Positive flatus. JP removed yesterday. Drainage from JP site  Objective: Vital signs in last 24 hours: Temp:  [97.8 F (36.6 C)-99 F (37.2 C)] 98.1 F (36.7 C) (03/02 1553) Pulse Rate:  [90-102] 90 (03/02 1553) Resp:  [14-18] 14 (03/02 1553) BP: (115-148)/(58-77) 132/66 (03/02 1553) SpO2:  [92 %-100 %] 92 % (03/02 1553)  Intake/Output from previous day: 03/01 0701 - 03/02 0700 In: 240 [P.O.:240] Out: 1375 [Urine:1375]  Intake/Output this shift: No intake/output data recorded.  Physical Exam:  General: Alert and oriented CV: RRR, palpable distal pulses Lungs: CTAB, equal chest rise Abdomen: Soft, NTND, no rebound or guarding, JP drain in place with serous output Incisions: Clean, dry and intact Gu: Foley draining clear-yellow urine Ext: NT, No erythema  Lab Results: Recent Labs    05/13/18 1136 05/14/18 0456 05/15/18 0416  HGB 8.1* 7.4* 7.3*  HCT 26.9* 24.1* 23.8*   BMET Recent Labs    05/15/18 1336  NA 140  K 3.0*  CL 101  CO2 30  GLUCOSE 105*  BUN 26*  CREATININE 1.86*  CALCIUM 9.3     Studies/Results: No results found.  Assessment/Plan: 1.  Postop day 4 status post assisted laparoscopic left distal ureterectomy with psoas hitch and Boari flap -Regular diet -PT to evaluate -likely discharge tomorrow  2.  Chronic anemia-transfuse 1 unit PRBCs today   LOS: 4 days   Lauren Hughs, MD [facility] Pager: [phone]  05/15/2018, 7:32 PM

## 2018-05-16 ENCOUNTER — Other Ambulatory Visit: Payer: Self-pay | Admitting: General Surgery

## 2018-05-16 DIAGNOSIS — Z853 Personal history of malignant neoplasm of breast: Secondary | ICD-10-CM

## 2018-05-16 LAB — TYPE AND SCREEN
ABO/RH(D): A POS
Antibody Screen: NEGATIVE
UNIT DIVISION: 0

## 2018-05-16 LAB — BPAM RBC
Blood Product Expiration Date: 202003212359
ISSUE DATE / TIME: 202003021314
Unit Type and Rh: 6200

## 2018-05-16 LAB — BASIC METABOLIC PANEL
Anion gap: 11 (ref 5–15)
BUN: 24 mg/dL — ABNORMAL HIGH (ref 8–23)
CHLORIDE: 100 mmol/L (ref 98–111)
CO2: 29 mmol/L (ref 22–32)
Calcium: 9.8 mg/dL (ref 8.9–10.3)
Creatinine, Ser: 1.77 mg/dL — ABNORMAL HIGH (ref 0.44–1.00)
GFR calc Af Amer: 29 mL/min — ABNORMAL LOW (ref >60)
GFR calc non Af Amer: 25 mL/min — ABNORMAL LOW (ref >60)
Glucose, Bld: 148 mg/dL — ABNORMAL HIGH (ref 70–99)
Potassium: 3 mmol/L — ABNORMAL LOW (ref 3.5–5.1)
SODIUM: 140 mmol/L (ref 135–145)

## 2018-05-16 LAB — CBC WITH DIFFERENTIAL/PLATELET
Abs Immature Granulocytes: 0.1 10*3/uL — ABNORMAL HIGH (ref 0.00–0.07)
Basophils Absolute: 0 10*3/uL (ref 0.0–0.1)
Basophils Relative: 0 %
Eosinophils Absolute: 0.3 10*3/uL (ref 0.0–0.5)
Eosinophils Relative: 4 %
HCT: 28 % — ABNORMAL LOW (ref 36.0–46.0)
Hemoglobin: 8.7 g/dL — ABNORMAL LOW (ref 12.0–15.0)
Immature Granulocytes: 1 %
Lymphocytes Relative: 17 %
Lymphs Abs: 1.3 10*3/uL (ref 0.7–4.0)
MCH: 29.7 pg (ref 26.0–34.0)
MCHC: 31.1 g/dL (ref 30.0–36.0)
MCV: 95.6 fL (ref 80.0–100.0)
Monocytes Absolute: 0.7 10*3/uL (ref 0.1–1.0)
Monocytes Relative: 9 %
Neutro Abs: 5.2 10*3/uL (ref 1.7–7.7)
Neutrophils Relative %: 69 %
Platelets: 291 10*3/uL (ref 150–400)
RBC: 2.93 MIL/uL — ABNORMAL LOW (ref 3.87–5.11)
RDW: 17.4 % — ABNORMAL HIGH (ref 11.5–15.5)
WBC: 7.5 10*3/uL (ref 4.0–10.5)
nRBC: 0 % (ref 0.0–0.2)

## 2018-05-16 LAB — MAGNESIUM: Magnesium: 2.2 mg/dL (ref 1.7–2.4)

## 2018-05-16 MED ORDER — POTASSIUM CHLORIDE CRYS ER 20 MEQ PO TBCR
40.0000 meq | EXTENDED_RELEASE_TABLET | Freq: Once | ORAL | Status: AC
  Administered 2018-05-16: 40 meq via ORAL
  Filled 2018-05-16: qty 2

## 2018-05-16 MED ORDER — POTASSIUM CHLORIDE CRYS ER 20 MEQ PO TBCR
40.0000 meq | EXTENDED_RELEASE_TABLET | Freq: Two times a day (BID) | ORAL | Status: DC
  Administered 2018-05-16: 40 meq via ORAL
  Filled 2018-05-16: qty 2

## 2018-05-16 NOTE — Care Management Note (Signed)
Case Management Note  Patient Details  Name: [name] MRN: [id number] Date of Birth: 1928/06/18  Subjective/Objective:                    Action/Plan:Pt discharging with RW, no HH Care.   Expected Discharge Date:  05/16/18               Expected Discharge Plan:  Home/Self Care  In-House Referral:     Discharge planning Services  CM Consult  Post Acute Care Choice:    Choice offered to:     DME Arranged:  Walker rolling DME Agency:  [facility]  HH Arranged:    [place] Agency:     Status of Service:  Completed, signed off  If discussed at [employer] of Stay Meetings, dates discussed:    Additional CommentsPurcell Mouton, RN 05/16/2018, 2:18 PM

## 2018-05-19 ENCOUNTER — Other Ambulatory Visit: Payer: Self-pay | Admitting: *Deleted

## 2018-05-19 NOTE — Patient Outreach (Signed)
[place] [facility]) Care Management  05/19/2018  [name] 1928/07/20 [id number]   EMMI-general discharge from [place] Day # 1  Date: 05/18/2018 1340 Red Alert Reason: Know who to call about changes in condition? No  Insurance: medicare   Outreach attempt # 1 No answer. [facility] RN CM left HIPAA compliant voicemail message along with CM's contact info.   Plan: [facility] RN CM sent an unsuccessful outreach letter and scheduled this patient for another call attempt within 4 business days   Kimberly L. Lavina Hamman, RN, BSN, [place] Coordinator Office number [phone] Mobile number [phone]  Main [facility] number [phone] Fax number [phone]

## 2018-05-22 ENCOUNTER — Other Ambulatory Visit (HOSPITAL_COMMUNITY): Payer: Self-pay | Admitting: General Surgery

## 2018-05-22 ENCOUNTER — Ambulatory Visit: Payer: Self-pay | Admitting: *Deleted

## 2018-05-22 DIAGNOSIS — Z853 Personal history of malignant neoplasm of breast: Secondary | ICD-10-CM

## 2018-05-23 ENCOUNTER — Other Ambulatory Visit: Payer: Self-pay | Admitting: *Deleted

## 2018-05-23 NOTE — Patient Outreach (Signed)
[place] [facility]) Care Management  05/23/2018  [name] 1928-09-29 [id number]   EMMI-general discharge from [place] Day # 1  Date: 05/18/2018 1340 Red Alert Reason: Know who to call about changes in condition? No  Insurance: medicare   Outreach attempt # 2 No answer. [facility] RN CM unable to leave a HIPAA compliant voicemail message along with CM's contact info.   Plan: [facility] RN CM scheduled this patient for the last call attempt within 4 business days A letter was sent and a voice message previously left    Talihina L. Lavina Hamman, RN, BSN, [place] Coordinator Office number [phone] Mobile number [phone]  Main [facility] number [phone] Fax number [phone]

## 2018-05-24 ENCOUNTER — Ambulatory Visit: Payer: Self-pay | Admitting: *Deleted

## 2018-05-25 ENCOUNTER — Ambulatory Visit: Payer: Self-pay | Admitting: *Deleted

## 2018-05-25 DIAGNOSIS — C669 Malignant neoplasm of unspecified ureter: Secondary | ICD-10-CM | POA: Diagnosis not present

## 2018-05-26 ENCOUNTER — Other Ambulatory Visit: Payer: Self-pay | Admitting: *Deleted

## 2018-05-26 ENCOUNTER — Other Ambulatory Visit: Payer: Self-pay

## 2018-05-26 NOTE — Patient Outreach (Signed)
[place] [facility] [place] [facility]) Care Management  05/26/2018  [name] December 15, 1928 [id number]   EMMI-general dischargefrom [facility] EMMI ALERT Day #1 Date:05/18/2018 1340 Red Alert Reason:Know who to call about changes in condition? No  Insurance:medicareand blue cross blue shield supplement  EMMI  She reports she was not feeling well on last weekend and did not know who to call Dr Wolfgang Phoenix or Dr Alyson Ingles but thankfully she reports she did not need to call anyone, her s/s resolved  Cm discussed that her MD to contact first would be Dr Lajean Silvius MD then Dr Noland Fordyce office  CM assessed for needs She denies any  Cm offered CM business hour numbers and nurse call center number [phone]  She was very appreciative to these contact numbers She is scheduled to see Dr Wolfgang Phoenix for hospital f/u on 05/29/18  06/06/18 mammogram   Conditions, COPD bladder cancer, HTN, CAS, HDL, right knee pain/arthritis, peripheral neuropathy, hx right breast cancer, renal bruit, dyspnea, pulmonary nodule  Hyperglycemia  Plan [facility] RN CM will close case at this time as patient has been assessed and no needs identified/needs resolved.   Pt encouraged to return a call to [place] or 24 hour nurse call center prn  [facility] RN CM sent a successful outreach letter as discussed with [facility] brochure enclosed for review- Pt reports she has not received the unsuccessful outreach letter sent     Joelene Millin L. Lavina Hamman, RN, BSN, [place] Coordinator Office number [phone] Mobile number [phone]  Main [facility] number [phone] Fax number [phone]

## 2018-05-29 ENCOUNTER — Ambulatory Visit (INDEPENDENT_AMBULATORY_CARE_PROVIDER_SITE_OTHER): Payer: Medicare Other | Admitting: Family Medicine

## 2018-05-29 ENCOUNTER — Encounter: Payer: Self-pay | Admitting: Family Medicine

## 2018-05-29 ENCOUNTER — Other Ambulatory Visit: Payer: Self-pay

## 2018-05-29 VITALS — BP 138/74 | Wt 151.4 lb

## 2018-05-29 DIAGNOSIS — I10 Essential (primary) hypertension: Secondary | ICD-10-CM | POA: Diagnosis not present

## 2018-05-29 DIAGNOSIS — N289 Disorder of kidney and ureter, unspecified: Secondary | ICD-10-CM | POA: Diagnosis not present

## 2018-05-29 DIAGNOSIS — D6489 Other specified anemias: Secondary | ICD-10-CM

## 2018-05-29 NOTE — Discharge Summary (Signed)
Physician Discharge Summary  Patient ID: [name] MRN: [id number] DOB/AGE: May 14, 1928 85 y.o.  Admit date: 05/11/2018 Discharge date: 05/16/2018  Admission Diagnoses: Ureteral mass Discharge Diagnoses:  Active Problems:   Ureteral mass   Discharged Condition: good  Hospital Course: The patient tolerated the procedure well and was transferred to the floor on IV pain meds, IV fluid. On POD#1 pt was started on clear liquid diet and they ambulated in the halls. On POD#2 the patient was transitioned to a regular diet, IVFs were discontinued, and the patient passed flatus. Prior to discharge the pt was tolerating a regular diet, pain was controlled on PO pain meds, they were ambulating without difficulty, and they had normal bowel function.   Consults: None  Significant Diagnostic Studies: none  Treatments: surgery: left robot assisted laparoscopic ureteral resection with reimplant  Discharge Exam: Blood pressure (!) 146/73, pulse 92, temperature 97.6 F (36.4 C), temperature source Oral, resp. rate 16, height 5\' 2"  (1.575 m), weight 64.6 kg, SpO2 94 %. General appearance: alert, cooperative and appears stated age Head: Normocephalic, without obvious abnormality, atraumatic Nose: Nares normal. Septum midline. Mucosa normal. No drainage or sinus tenderness. Neck: no adenopathy, no carotid bruit, no JVD, supple, symmetrical, trachea midline and thyroid not enlarged, symmetric, no tenderness/mass/nodules Back: symmetric, no curvature. ROM normal. No CVA tenderness. Cardio: regular rate and rhythm, S1, S2 normal, no murmur, click, rub or gallop GI: soft, non-tender; bowel sounds normal; no masses,  no organomegaly Extremities: extremities normal, atraumatic, no cyanosis or edema Neurologic: Grossly normal  Disposition:    Allergies as of 05/16/2018      Reactions   Augmentin [amoxicillin-pot Clavulanate] Nausea And Vomiting   Has patient had a PCN reaction causing immediate  rash, facial/tongue/throat swelling, SOB or lightheadedness with hypotension: No Has patient had a PCN reaction causing severe rash involving mucus membranes or skin necrosis: No Has patient had a PCN reaction that required hospitalization: No Has patient had a PCN reaction occurring within the last 10 years: Unknown If all of the above answers are "NO", then may proceed with Cephalosporin use.   Amoxicillin Diarrhea   Has patient had a PCN reaction causing immediate rash, facial/tongue/throat swelling, SOB or lightheadedness with hypotension: No Has patient had a PCN reaction causing severe rash involving mucus membranes or skin necrosis: No Has patient had a PCN reaction that required hospitalization: No Has patient had a PCN reaction occurring within the last 10 years: Unknown If all of the above answers are "NO", then may proceed with Cephalosporin use.   Codeine Nausea And Vomiting   Morphine Nausea And Vomiting   Neomycin-bacitracin Zn-polymyx Itching, Rash   Unable to use Neosporin      Medication List    STOP taking these medications   Centrum Silver 50+Women Tabs     TAKE these medications   amLODipine 5 MG tablet Commonly known as:  NORVASC Take 1 tablet (5 mg total) by mouth daily.   anastrozole 1 MG tablet Commonly known as:  ARIMIDEX Take 1 tablet (1 mg total) by mouth daily. What changed:  when to take this   fluticasone 50 MCG/ACT nasal spray Commonly known as:  FLONASE Place 2 sprays into both nostrils daily as needed for allergies.   furosemide 20 MG tablet Commonly known as:  LASIX Take 1 tablet (20 mg total) by mouth daily.   nortriptyline 10 MG capsule Commonly known as:  PAMELOR Take 3 capsules (30 mg total) by mouth at bedtime. Take 2-3  at night What changed:    how much to take  additional instructions   OXYGEN Inhale 2 L into the lungs. As needed for activities requiring exertion, per patient uses very sparingly   polyethylene glycol  packet Commonly known as:  MIRALAX / GLYCOLAX Take 17 g by mouth daily as needed for moderate constipation.   predniSONE 20 MG tablet Commonly known as:  DELTASONE Take 1 tablet (20 mg total) by mouth daily with breakfast.   ProAir RespiClick 108 (90 Base) MCG/ACT Aepb Generic drug:  Albuterol Sulfate INHALE 1 PUFF INTO THE LUNGS EVERY 4 HOURS AS NEEDED. What changed:  See the new instructions.   Stiolto Respimat 2.5-2.5 MCG/ACT Aers Generic drug:  Tiotropium Bromide-Olodaterol INHALE 2 PUFFS INTO THE LUNGS ONCE DAILY. What changed:  See the new instructions.   traMADol 50 MG tablet Commonly known as:  Ultram Take 1-2 tablets (50-100 mg total) by mouth every 6 (six) hours as needed for moderate pain or severe pain.   triamcinolone cream 0.1 % Commonly known as:  KENALOG Apply 1 application topically 2 (two) times daily.      Follow-up Information    Terricka Onofrio, Mardene Celeste, MD On 05/24/2018.   Specialty:  Urology Why:  at 1:30 Contact information: [address] [place] [address] [phone]           Signed: Wilkie Aye 05/29/2018, 10:17 AM

## 2018-05-29 NOTE — Progress Notes (Addendum)
   Subjective:    Patient ID: [name], female    DOB: 12-18-1928, 86 y.o.   MRN: [id number]  HPI Pt here today for follow up from hospital. Pt was in [place] for about 5 days. Pt states her BP has been fluctuating. Pt has been checking BP every morning.   Patient for blood pressure check up.  The patient does have hypertension.  The patient is on medication.  Patient relates compliance with meds. Todays BP reviewed with the patient. Patient denies issues with medication. Patient relates reasonable diet. Patient tries to minimize salt. Patient aware of BP goals.  Review of Systems  Constitutional: Negative for activity change, appetite change and fatigue.  HENT: Negative for congestion and rhinorrhea.   Respiratory: Negative for cough and shortness of breath.   Cardiovascular: Negative for chest pain and leg swelling.  Gastrointestinal: Negative for abdominal pain and diarrhea.  Endocrine: Negative for polydipsia and polyphagia.  Skin: Negative for color change.  Neurological: Negative for dizziness and weakness.  Psychiatric/Behavioral: Negative for behavioral problems and confusion.   15 minutes was spent with patient today discussing healthcare issues which they came.  More than 50% of this visit-total duration of visit-was spent in counseling and coordination of care.  Please see diagnosis regarding the focus of this coordination and care     Objective:   Physical Exam Vitals signs reviewed.  Constitutional:      General: She is not in acute distress. HENT:     Head: Normocephalic and atraumatic.  Eyes:     General:        Right eye: No discharge.        Left eye: No discharge.  Neck:     Trachea: No tracheal deviation.  Cardiovascular:     Rate and Rhythm: Normal rate and regular rhythm.     Heart sounds: Normal heart sounds. No murmur.  Pulmonary:     Effort: Pulmonary effort is normal. No respiratory distress.     Breath sounds: Normal breath sounds.   Lymphadenopathy:     Cervical: No cervical adenopathy.  Skin:    General: Skin is warm and dry.  Neurological:     Mental Status: She is alert.     Coordination: Coordination normal.  Psychiatric:        Behavior: Behavior normal.           Assessment & Plan:  HTN- Patient was seen today as part of a visit regarding hypertension. The importance of healthy diet and regular physical activity was discussed. The importance of compliance with medications discussed.  Ideal goal is to keep blood pressure low elevated levels certainly below 829/56 when possible.  The patient was counseled that keeping blood pressure under control lessen his risk of complications.  The importance of regular follow-ups was discussed with the patient.  Low-salt diet such as DASH recommended.  Regular physical activity was recommended as well.  Patient was advised to keep regular follow-ups.  Renal insuff doing better  Recheck here 4 months

## 2018-05-30 LAB — CBC WITH DIFFERENTIAL/PLATELET
Basophils Absolute: 0 10*3/uL (ref 0.0–0.2)
Basos: 1 %
EOS (ABSOLUTE): 0.2 10*3/uL (ref 0.0–0.4)
Eos: 3 %
Hematocrit: 30.9 % — ABNORMAL LOW (ref 34.0–46.6)
Hemoglobin: 10.1 g/dL — ABNORMAL LOW (ref 11.1–15.9)
Immature Grans (Abs): 0 10*3/uL (ref 0.0–0.1)
Immature Granulocytes: 1 %
Lymphocytes Absolute: 1.4 10*3/uL (ref 0.7–3.1)
Lymphs: 18 %
MCH: 29.5 pg (ref 26.6–33.0)
MCHC: 32.7 g/dL (ref 31.5–35.7)
MCV: 90 fL (ref 79–97)
Monocytes Absolute: 0.5 10*3/uL (ref 0.1–0.9)
Monocytes: 7 %
NEUTROS PCT: 70 %
Neutrophils Absolute: 5.5 10*3/uL (ref 1.4–7.0)
Platelets: 395 10*3/uL (ref 150–450)
RBC: 3.42 x10E6/uL — ABNORMAL LOW (ref 3.77–5.28)
RDW: 14.5 % (ref 11.7–15.4)
WBC: 7.8 10*3/uL (ref 3.4–10.8)

## 2018-05-30 LAB — BASIC METABOLIC PANEL
BUN/Creatinine Ratio: 15 (ref 12–28)
BUN: 29 mg/dL — ABNORMAL HIGH (ref 8–27)
CO2: 25 mmol/L (ref 20–29)
Calcium: 10.3 mg/dL (ref 8.7–10.3)
Chloride: 99 mmol/L (ref 96–106)
Creatinine, Ser: 1.91 mg/dL — ABNORMAL HIGH (ref 0.57–1.00)
GFR calc Af Amer: 27 mL/min/{1.73_m2} — ABNORMAL LOW (ref >59)
GFR calc non Af Amer: 23 mL/min/{1.73_m2} — ABNORMAL LOW (ref >59)
Glucose: 101 mg/dL — ABNORMAL HIGH (ref 65–99)
Potassium: 4.1 mmol/L (ref 3.5–5.2)
Sodium: 142 mmol/L (ref 134–144)

## 2018-06-05 ENCOUNTER — Telehealth: Payer: Self-pay | Admitting: Family Medicine

## 2018-06-05 NOTE — Telephone Encounter (Signed)
Pt contacted. Pt states she does not have any home health care at this time. Pt states that she is scared to do an enema. Pt states that last time this happened she had to have bowel removed manually. Pt advised to take Miralax daily. Pt verbalized understanding.

## 2018-06-05 NOTE — Telephone Encounter (Signed)
So [facility] hopefully could deliver some medication for the patient I would recommend generic MiraLAX 1 capful in 8 ounces of water drink daily.  Also make sure that she is getting vegetables fruits water daily If progressive troubles to let us know If she is taking any opioid pain medicine she should do her best to avoid it because it can aggravate constipation

## 2018-06-05 NOTE — Telephone Encounter (Signed)
Pt states she has chronic constipation & would like to speak to a nurse

## 2018-06-05 NOTE — Telephone Encounter (Signed)
Please connect with home health to see if they are willing or able to do a home visit regarding this she may need an enema  If not she may need to get a friend to give her enema carefully  And the generic MiraLAX on a daily basis is still a good idea

## 2018-06-05 NOTE — Telephone Encounter (Signed)
[employer]

## 2018-06-05 NOTE — Telephone Encounter (Signed)
Patient states she has had constipation since her last surgery but nothing is helping her she has tried stool softeners and laxatives with no help. Patient states she is scared to try enema due to recent surgery and she also has no one that could do one-she cant go out and was told to not have anyone come in. Patient states she is completely miserable and doesn't know what to do to get relief. Please advise

## 2018-06-05 NOTE — Telephone Encounter (Signed)
Patient states she has already bee on the stool softener for well over a week as well as daily dulcolax with no results and she is completley miserable and in a bind

## 2018-06-06 ENCOUNTER — Encounter (HOSPITAL_COMMUNITY): Payer: Medicare Other

## 2018-06-06 ENCOUNTER — Other Ambulatory Visit (HOSPITAL_COMMUNITY): Payer: Medicare Other

## 2018-06-08 ENCOUNTER — Ambulatory Visit (HOSPITAL_COMMUNITY): Payer: Medicare Other | Admitting: Hematology

## 2018-06-26 ENCOUNTER — Ambulatory Visit: Payer: Medicare Other | Admitting: Pulmonary Disease

## 2018-06-27 ENCOUNTER — Ambulatory Visit: Payer: Medicare Other | Admitting: Family Medicine

## 2018-06-30 ENCOUNTER — Encounter: Payer: Medicare Other | Admitting: Family Medicine

## 2018-07-01 ENCOUNTER — Other Ambulatory Visit: Payer: Self-pay | Admitting: Family Medicine

## 2018-07-07 DIAGNOSIS — C669 Malignant neoplasm of unspecified ureter: Secondary | ICD-10-CM | POA: Diagnosis not present

## 2018-07-17 ENCOUNTER — Other Ambulatory Visit: Payer: Medicare Other

## 2018-07-20 ENCOUNTER — Ambulatory Visit: Payer: Medicare Other | Admitting: Pulmonary Disease

## 2018-08-10 ENCOUNTER — Other Ambulatory Visit: Payer: Self-pay | Admitting: Family Medicine

## 2018-08-15 ENCOUNTER — Other Ambulatory Visit (HOSPITAL_COMMUNITY): Payer: Medicare Other

## 2018-08-15 ENCOUNTER — Encounter (HOSPITAL_COMMUNITY): Payer: Medicare Other

## 2018-08-23 DIAGNOSIS — M25561 Pain in right knee: Secondary | ICD-10-CM | POA: Diagnosis not present

## 2018-08-23 DIAGNOSIS — M25461 Effusion, right knee: Secondary | ICD-10-CM | POA: Diagnosis not present

## 2018-08-23 DIAGNOSIS — M1711 Unilateral primary osteoarthritis, right knee: Secondary | ICD-10-CM | POA: Diagnosis not present

## 2018-08-30 DIAGNOSIS — M25561 Pain in right knee: Secondary | ICD-10-CM | POA: Diagnosis not present

## 2018-08-30 DIAGNOSIS — M1711 Unilateral primary osteoarthritis, right knee: Secondary | ICD-10-CM | POA: Diagnosis not present

## 2018-09-05 ENCOUNTER — Ambulatory Visit (HOSPITAL_COMMUNITY): Payer: Medicare Other

## 2018-09-07 ENCOUNTER — Other Ambulatory Visit (HOSPITAL_COMMUNITY): Payer: Self-pay | Admitting: General Surgery

## 2018-09-07 DIAGNOSIS — C50111 Malignant neoplasm of central portion of right female breast: Secondary | ICD-10-CM

## 2018-09-11 ENCOUNTER — Other Ambulatory Visit: Payer: Self-pay | Admitting: Family Medicine

## 2018-09-13 DIAGNOSIS — M1711 Unilateral primary osteoarthritis, right knee: Secondary | ICD-10-CM | POA: Diagnosis not present

## 2018-09-13 DIAGNOSIS — M25561 Pain in right knee: Secondary | ICD-10-CM | POA: Diagnosis not present

## 2018-09-15 ENCOUNTER — Other Ambulatory Visit: Payer: Self-pay | Admitting: Family Medicine

## 2018-09-18 ENCOUNTER — Other Ambulatory Visit: Payer: Self-pay | Admitting: Family Medicine

## 2018-09-18 NOTE — Telephone Encounter (Signed)
Please verify patient still taking this if so may have 1 refill

## 2018-09-19 ENCOUNTER — Other Ambulatory Visit: Payer: Self-pay

## 2018-09-19 ENCOUNTER — Ambulatory Visit (HOSPITAL_COMMUNITY)
Admission: RE | Admit: 2018-09-19 | Discharge: 2018-09-19 | Disposition: A | Discharge status: Home / Self Care | Payer: Medicare Other | Source: Ambulatory Visit | Attending: General Surgery | Admitting: General Surgery

## 2018-09-19 ENCOUNTER — Ambulatory Visit (HOSPITAL_COMMUNITY): Admission: RE | Admit: 2018-09-19 | Payer: Medicare Other | Source: Ambulatory Visit

## 2018-09-19 ENCOUNTER — Ambulatory Visit (HOSPITAL_COMMUNITY): Payer: Medicare Other

## 2018-09-19 DIAGNOSIS — R928 Other abnormal and inconclusive findings on diagnostic imaging of breast: Secondary | ICD-10-CM | POA: Diagnosis not present

## 2018-09-19 DIAGNOSIS — C50111 Malignant neoplasm of central portion of right female breast: Secondary | ICD-10-CM | POA: Diagnosis not present

## 2018-09-19 DIAGNOSIS — Z853 Personal history of malignant neoplasm of breast: Secondary | ICD-10-CM | POA: Diagnosis not present

## 2018-09-20 NOTE — Telephone Encounter (Signed)
Patient states she does still take this medication and needs a refill

## 2018-09-27 DIAGNOSIS — M25461 Effusion, right knee: Secondary | ICD-10-CM | POA: Diagnosis not present

## 2018-09-27 DIAGNOSIS — L308 Other specified dermatitis: Secondary | ICD-10-CM | POA: Diagnosis not present

## 2018-09-27 DIAGNOSIS — D1801 Hemangioma of skin and subcutaneous tissue: Secondary | ICD-10-CM | POA: Diagnosis not present

## 2018-09-27 DIAGNOSIS — M1711 Unilateral primary osteoarthritis, right knee: Secondary | ICD-10-CM | POA: Diagnosis not present

## 2018-09-27 DIAGNOSIS — M25561 Pain in right knee: Secondary | ICD-10-CM | POA: Diagnosis not present

## 2018-09-27 DIAGNOSIS — L570 Actinic keratosis: Secondary | ICD-10-CM | POA: Diagnosis not present

## 2018-09-27 DIAGNOSIS — L820 Inflamed seborrheic keratosis: Secondary | ICD-10-CM | POA: Diagnosis not present

## 2018-09-27 DIAGNOSIS — L814 Other melanin hyperpigmentation: Secondary | ICD-10-CM | POA: Diagnosis not present

## 2018-09-27 DIAGNOSIS — L821 Other seborrheic keratosis: Secondary | ICD-10-CM | POA: Diagnosis not present

## 2018-09-27 DIAGNOSIS — D225 Melanocytic nevi of trunk: Secondary | ICD-10-CM | POA: Diagnosis not present

## 2018-09-28 ENCOUNTER — Ambulatory Visit: Payer: Medicare Other | Admitting: Family Medicine

## 2018-09-28 ENCOUNTER — Encounter: Payer: Medicare Other | Admitting: Family Medicine

## 2018-10-03 ENCOUNTER — Ambulatory Visit (INDEPENDENT_AMBULATORY_CARE_PROVIDER_SITE_OTHER): Payer: Medicare Other | Admitting: Family Medicine

## 2018-10-03 ENCOUNTER — Ambulatory Visit (HOSPITAL_COMMUNITY): Payer: Medicare Other | Admitting: Hematology

## 2018-10-03 ENCOUNTER — Other Ambulatory Visit: Payer: Self-pay

## 2018-10-03 ENCOUNTER — Encounter: Payer: Self-pay | Admitting: Family Medicine

## 2018-10-03 VITALS — BP 120/82 | Temp 97.2°F | Ht 62.0 in | Wt 136.6 lb

## 2018-10-03 DIAGNOSIS — Z0000 Encounter for general adult medical examination without abnormal findings: Secondary | ICD-10-CM

## 2018-10-03 DIAGNOSIS — D692 Other nonthrombocytopenic purpura: Secondary | ICD-10-CM | POA: Diagnosis not present

## 2018-10-03 DIAGNOSIS — J449 Chronic obstructive pulmonary disease, unspecified: Secondary | ICD-10-CM

## 2018-10-03 DIAGNOSIS — E785 Hyperlipidemia, unspecified: Secondary | ICD-10-CM | POA: Diagnosis not present

## 2018-10-03 DIAGNOSIS — D6489 Other specified anemias: Secondary | ICD-10-CM | POA: Diagnosis not present

## 2018-10-03 DIAGNOSIS — N289 Disorder of kidney and ureter, unspecified: Secondary | ICD-10-CM

## 2018-10-03 DIAGNOSIS — I10 Essential (primary) hypertension: Secondary | ICD-10-CM

## 2018-10-03 DIAGNOSIS — M8580 Other specified disorders of bone density and structure, unspecified site: Secondary | ICD-10-CM | POA: Diagnosis not present

## 2018-10-03 DIAGNOSIS — Z780 Asymptomatic menopausal state: Secondary | ICD-10-CM | POA: Diagnosis not present

## 2018-10-03 NOTE — Progress Notes (Signed)
Subjective:    Patient ID: [name], female    DOB: 04/12/1928, 85 y.o.   MRN: [id number]  HPI  The patient comes in today for a wellness visit.    A review of their health history was completed.  A review of medications was also completed.  Any needed refills; no  Eating habits: eats good- has lost weight after surgery  Falls/  MVA accidents in past few months: tripped over root in yard but no injury  Regular exercise: not abl to exercise  Specialist pt sees on regular basis: kidney doctor- Dr Alyson Ingles  Preventative health issues were discussed.   Additional concerns: none   Review of Systems  Constitutional: Negative for activity change, appetite change and fatigue.  HENT: Negative for congestion and rhinorrhea.   Eyes: Negative for discharge.  Respiratory: Negative for cough, chest tightness and wheezing.   Cardiovascular: Negative for chest pain.  Gastrointestinal: Negative for abdominal pain, blood in stool and vomiting.  Endocrine: Negative for polyphagia.  Genitourinary: Negative for difficulty urinating and frequency.  Musculoskeletal: Negative for neck pain.  Skin: Negative for color change.  Allergic/Immunologic: Negative for environmental allergies and food allergies.  Neurological: Negative for weakness and headaches.  Psychiatric/Behavioral: Negative for agitation and behavioral problems.       Objective:   Physical Exam Vitals signs reviewed.  Constitutional:      General: She is not in acute distress. HENT:     Head: Normocephalic and atraumatic.  Eyes:     General:        Right eye: No discharge.        Left eye: No discharge.  Neck:     Trachea: No tracheal deviation.  Cardiovascular:     Rate and Rhythm: Normal rate and regular rhythm.     Heart sounds: Normal heart sounds. No murmur.  Pulmonary:     Effort: Pulmonary effort is normal. No respiratory distress.     Breath sounds: Normal breath sounds.  Lymphadenopathy:   Cervical: No cervical adenopathy.  Skin:    General: Skin is warm and dry.  Neurological:     Mental Status: She is alert.     Coordination: Coordination normal.  Psychiatric:        Behavior: Behavior normal.    Patient with mild senile purpura on her arms       Assessment & Plan:  1. Essential hypertension, benign Blood pressure under decent control blood pressure followed intermittently by the patient doing very well today watching diet staying active minimizing salt - Basic metabolic panel - Hepatic function panel  2. Hyperlipidemia, unspecified hyperlipidemia type History of hyperlipidemia patient will check new labs previous labs ordered and reviewed with patient - Lipid panel  3. Anemia due to other cause, not classified Mild anemia in the past from recent surgery she does have a cancer history recommend repeating this lab work - CBC with Differential/Platelet  4. Renal insufficiency Significant renal insufficiency and chronic kidney disease recommend repeat of that 7 to see where she is at currently patient also had surgery on her ureter this went well follows up with urology in the near future - Basic metabolic panel  5. Chronic obstructive pulmonary disease, unspecified COPD type ([place]) COPD stable currently gets out of breath if she pushes herself too hard but otherwise doing okay no flareups recently  6. Osteopenia, unspecified location Patient has bone osteopenia issue repeat bone density has recommended - DG Bone Density  7. Post-menopausal Bone  density - DG Bone Density  8. Medicare annual wellness visit, subsequent Adult wellness-complete.wellness physical was conducted today. Importance of diet and exercise were discussed in detail.  In addition to this a discussion regarding safety was also covered. We also reviewed over immunizations and gave recommendations regarding current immunization needed for age.  In addition to this additional areas were also  touched on including: Preventative health exams needed: I do recommend a follow-up pneumonia vaccine this was given to the patient by prescription Colonoscopy not indicated  Patient was advised yearly wellness exam   9. Senile purpura ([place]) Stable awaiting CBC results Patient will do follow-up this fall

## 2018-10-05 DIAGNOSIS — J449 Chronic obstructive pulmonary disease, unspecified: Secondary | ICD-10-CM | POA: Diagnosis not present

## 2018-10-05 DIAGNOSIS — Z9049 Acquired absence of other specified parts of digestive tract: Secondary | ICD-10-CM | POA: Diagnosis not present

## 2018-10-05 DIAGNOSIS — Z9889 Other specified postprocedural states: Secondary | ICD-10-CM | POA: Diagnosis not present

## 2018-10-05 DIAGNOSIS — I10 Essential (primary) hypertension: Secondary | ICD-10-CM | POA: Diagnosis not present

## 2018-10-05 DIAGNOSIS — C50111 Malignant neoplasm of central portion of right female breast: Secondary | ICD-10-CM | POA: Diagnosis not present

## 2018-10-09 DIAGNOSIS — E785 Hyperlipidemia, unspecified: Secondary | ICD-10-CM | POA: Diagnosis not present

## 2018-10-09 DIAGNOSIS — N289 Disorder of kidney and ureter, unspecified: Secondary | ICD-10-CM | POA: Diagnosis not present

## 2018-10-09 DIAGNOSIS — D6489 Other specified anemias: Secondary | ICD-10-CM | POA: Diagnosis not present

## 2018-10-09 DIAGNOSIS — I10 Essential (primary) hypertension: Secondary | ICD-10-CM | POA: Diagnosis not present

## 2018-10-10 LAB — BASIC METABOLIC PANEL
BUN/Creatinine Ratio: 18 (ref 12–28)
BUN: 33 mg/dL — ABNORMAL HIGH (ref 8–27)
CO2: 25 mmol/L (ref 20–29)
Calcium: 10 mg/dL (ref 8.7–10.3)
Chloride: 102 mmol/L (ref 96–106)
Creatinine, Ser: 1.84 mg/dL — ABNORMAL HIGH (ref 0.57–1.00)
GFR calc Af Amer: 28 mL/min/{1.73_m2} — ABNORMAL LOW (ref >59)
GFR calc non Af Amer: 24 mL/min/{1.73_m2} — ABNORMAL LOW (ref >59)
Glucose: 99 mg/dL (ref 65–99)
Potassium: 5.2 mmol/L (ref 3.5–5.2)
Sodium: 141 mmol/L (ref 134–144)

## 2018-10-10 LAB — CBC WITH DIFFERENTIAL/PLATELET
Basophils Absolute: 0 10*3/uL (ref 0.0–0.2)
Basos: 0 %
EOS (ABSOLUTE): 0.2 10*3/uL (ref 0.0–0.4)
Eos: 2 %
Hematocrit: 26.1 % — ABNORMAL LOW (ref 34.0–46.6)
Hemoglobin: 8.3 g/dL — ABNORMAL LOW (ref 11.1–15.9)
Immature Grans (Abs): 0 10*3/uL (ref 0.0–0.1)
Immature Granulocytes: 0 %
Lymphocytes Absolute: 1.5 10*3/uL (ref 0.7–3.1)
Lymphs: 16 %
MCH: 29.1 pg (ref 26.6–33.0)
MCHC: 31.8 g/dL (ref 31.5–35.7)
MCV: 92 fL (ref 79–97)
Monocytes Absolute: 0.6 10*3/uL (ref 0.1–0.9)
Monocytes: 6 %
Neutrophils Absolute: 7.2 10*3/uL — ABNORMAL HIGH (ref 1.4–7.0)
Neutrophils: 76 %
Platelets: 373 10*3/uL (ref 150–450)
RBC: 2.85 x10E6/uL — ABNORMAL LOW (ref 3.77–5.28)
RDW: 12.7 % (ref 11.7–15.4)
WBC: 9.5 10*3/uL (ref 3.4–10.8)

## 2018-10-10 LAB — HEPATIC FUNCTION PANEL
ALT: 16 IU/L (ref 0–32)
AST: 17 IU/L (ref 0–40)
Albumin: 4.1 g/dL (ref 3.6–4.6)
Alkaline Phosphatase: 91 IU/L (ref 39–117)
Bilirubin Total: <0.2 mg/dL (ref 0.0–1.2)
Bilirubin, Direct: 0.08 mg/dL (ref 0.00–0.40)
Total Protein: 6.6 g/dL (ref 6.0–8.5)

## 2018-10-10 LAB — LIPID PANEL
Chol/HDL Ratio: 2.7 ratio (ref 0.0–4.4)
Cholesterol, Total: 210 mg/dL — ABNORMAL HIGH (ref 100–199)
HDL: 79 mg/dL (ref >39)
LDL Calculated: 107 mg/dL — ABNORMAL HIGH (ref 0–99)
Triglycerides: 119 mg/dL (ref 0–149)
VLDL Cholesterol Cal: 24 mg/dL (ref 5–40)

## 2018-10-11 DIAGNOSIS — C50411 Malignant neoplasm of upper-outer quadrant of right female breast: Secondary | ICD-10-CM | POA: Diagnosis not present

## 2018-10-11 DIAGNOSIS — Z853 Personal history of malignant neoplasm of breast: Secondary | ICD-10-CM | POA: Diagnosis not present

## 2018-10-11 DIAGNOSIS — Z9889 Other specified postprocedural states: Secondary | ICD-10-CM | POA: Diagnosis not present

## 2018-10-11 DIAGNOSIS — Z08 Encounter for follow-up examination after completed treatment for malignant neoplasm: Secondary | ICD-10-CM | POA: Diagnosis not present

## 2018-10-11 DIAGNOSIS — Z170 Estrogen receptor positive status [ER+]: Secondary | ICD-10-CM | POA: Diagnosis not present

## 2018-10-11 DIAGNOSIS — R0602 Shortness of breath: Secondary | ICD-10-CM | POA: Diagnosis not present

## 2018-10-11 DIAGNOSIS — Z923 Personal history of irradiation: Secondary | ICD-10-CM | POA: Diagnosis not present

## 2018-10-11 DIAGNOSIS — Z8554 Personal history of malignant neoplasm of ureter: Secondary | ICD-10-CM | POA: Diagnosis not present

## 2018-10-12 ENCOUNTER — Other Ambulatory Visit: Payer: Self-pay

## 2018-10-12 ENCOUNTER — Ambulatory Visit (HOSPITAL_COMMUNITY)
Admission: RE | Admit: 2018-10-12 | Discharge: 2018-10-12 | Disposition: A | Discharge status: Home / Self Care | Payer: Medicare Other | Source: Ambulatory Visit | Attending: Family Medicine | Admitting: Family Medicine

## 2018-10-12 DIAGNOSIS — M8580 Other specified disorders of bone density and structure, unspecified site: Secondary | ICD-10-CM | POA: Insufficient documentation

## 2018-10-12 DIAGNOSIS — Z780 Asymptomatic menopausal state: Secondary | ICD-10-CM | POA: Insufficient documentation

## 2018-10-12 DIAGNOSIS — M8589 Other specified disorders of bone density and structure, multiple sites: Secondary | ICD-10-CM | POA: Diagnosis not present

## 2018-10-16 ENCOUNTER — Other Ambulatory Visit: Payer: Self-pay | Admitting: Family Medicine

## 2018-10-16 DIAGNOSIS — Z1211 Encounter for screening for malignant neoplasm of colon: Secondary | ICD-10-CM

## 2018-10-18 DIAGNOSIS — Z961 Presence of intraocular lens: Secondary | ICD-10-CM | POA: Diagnosis not present

## 2018-10-18 DIAGNOSIS — H40013 Open angle with borderline findings, low risk, bilateral: Secondary | ICD-10-CM | POA: Diagnosis not present

## 2018-10-26 ENCOUNTER — Other Ambulatory Visit: Payer: Self-pay | Admitting: Family Medicine

## 2018-10-31 ENCOUNTER — Ambulatory Visit (HOSPITAL_COMMUNITY): Payer: Medicare Other | Admitting: Hematology

## 2018-11-01 ENCOUNTER — Ambulatory Visit: Payer: Medicare Other | Admitting: Urology

## 2018-11-13 ENCOUNTER — Other Ambulatory Visit: Payer: Self-pay | Admitting: Family Medicine

## 2018-11-14 ENCOUNTER — Ambulatory Visit (HOSPITAL_COMMUNITY): Payer: Medicare Other | Admitting: Hematology

## 2018-11-14 NOTE — Telephone Encounter (Signed)
This +3 refills of each 

## 2018-11-23 ENCOUNTER — Encounter (HOSPITAL_COMMUNITY): Payer: Self-pay | Admitting: Hematology

## 2018-11-23 ENCOUNTER — Inpatient Hospital Stay (HOSPITAL_COMMUNITY): Payer: Medicare Other | Attending: Hematology | Admitting: Hematology

## 2018-11-23 ENCOUNTER — Other Ambulatory Visit: Payer: Self-pay

## 2018-11-23 ENCOUNTER — Other Ambulatory Visit (HOSPITAL_COMMUNITY): Payer: Medicare Other

## 2018-11-23 VITALS — BP 139/52 | HR 97 | Temp 97.7°F | Resp 18 | Wt 138.8 lb

## 2018-11-23 DIAGNOSIS — J449 Chronic obstructive pulmonary disease, unspecified: Secondary | ICD-10-CM | POA: Insufficient documentation

## 2018-11-23 DIAGNOSIS — C662 Malignant neoplasm of left ureter: Secondary | ICD-10-CM | POA: Insufficient documentation

## 2018-11-23 DIAGNOSIS — Z170 Estrogen receptor positive status [ER+]: Secondary | ICD-10-CM | POA: Insufficient documentation

## 2018-11-23 DIAGNOSIS — I10 Essential (primary) hypertension: Secondary | ICD-10-CM | POA: Diagnosis not present

## 2018-11-23 DIAGNOSIS — Z0000 Encounter for general adult medical examination without abnormal findings: Secondary | ICD-10-CM | POA: Diagnosis not present

## 2018-11-23 DIAGNOSIS — C50911 Malignant neoplasm of unspecified site of right female breast: Secondary | ICD-10-CM | POA: Diagnosis not present

## 2018-11-23 DIAGNOSIS — Z923 Personal history of irradiation: Secondary | ICD-10-CM | POA: Insufficient documentation

## 2018-11-23 DIAGNOSIS — Z79899 Other long term (current) drug therapy: Secondary | ICD-10-CM | POA: Diagnosis not present

## 2018-11-23 DIAGNOSIS — M8580 Other specified disorders of bone density and structure, unspecified site: Secondary | ICD-10-CM | POA: Insufficient documentation

## 2018-11-23 DIAGNOSIS — Z23 Encounter for immunization: Secondary | ICD-10-CM | POA: Diagnosis not present

## 2018-11-23 DIAGNOSIS — E7800 Pure hypercholesterolemia, unspecified: Secondary | ICD-10-CM | POA: Insufficient documentation

## 2018-11-23 DIAGNOSIS — M1990 Unspecified osteoarthritis, unspecified site: Secondary | ICD-10-CM | POA: Diagnosis not present

## 2018-11-23 DIAGNOSIS — Z7952 Long term (current) use of systemic steroids: Secondary | ICD-10-CM | POA: Insufficient documentation

## 2018-11-23 DIAGNOSIS — Z87891 Personal history of nicotine dependence: Secondary | ICD-10-CM | POA: Diagnosis not present

## 2018-11-23 DIAGNOSIS — K5900 Constipation, unspecified: Secondary | ICD-10-CM | POA: Diagnosis not present

## 2018-11-23 DIAGNOSIS — Z79811 Long term (current) use of aromatase inhibitors: Secondary | ICD-10-CM | POA: Diagnosis not present

## 2018-11-23 MED ORDER — FLUAD QUADRIVALENT 0.5 ML IM PRSY: 0.5000 mL | PREFILLED_SYRINGE | Freq: Once | INTRAMUSCULAR | 0 refills | Status: DC

## 2018-11-23 MED ORDER — INFLUENZA VAC A&B SA ADJ QUAD 0.5 ML IM PRSY
0.5000 mL | PREFILLED_SYRINGE | Freq: Once | INTRAMUSCULAR | Status: AC
  Administered 2018-11-23: 13:00:00 0.5 mL via INTRAMUSCULAR
  Filled 2018-11-23: qty 0.5

## 2018-11-23 NOTE — Progress Notes (Signed)
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 AND newly diagnosed left urethra cancer.  CURRENT THERAPY:  Anastrozole.  BRIEF ONCOLOGIC HISTORY:  Oncology History  Breast cancer, right breast ([place])  04/22/2015 Procedure   Right breast needle core biopsy   04/24/2015 Pathology Results   Invasive ductal carcinoma, grade 1.  ER100%, PR NEGATIVE, Ki-67 10%, HER2 NEGATIVE   05/23/2015 Procedure   Right breast lumpectomy by Dr. Dalbert Batman   05/27/2015 Pathology Results   Breast, lumpectomy, Right - INVASIVE GRADE I DUCTAL CARCINOMA SPANNING 0.7 CM IN GREATEST LINEAR DIMENSION. - ASSOCIATED LOW GRADE DUCTAL CARCINOMA IN SITU. - TUMOR SHOWS ASSOCIATED CALCIFICATIONS. - LOBULAR CARCINOMA IN SITU WITH ASSOCIATED CALCIFICATIONS. - MARGINS ARE NEGATIVE.    - 07/23/2015 Radiation Therapy   Dr. Pablo Ledger   08/01/2015 Imaging   Bone density- BMD as determined from Femur Neck Left is 0.891 g/cm2 with a T-Score of -1.1. This patient is considered osteopenic according to [facility]) criteria.   08/14/2015 -  Anti-estrogen oral therapy   Arimidex   08/02/2016 Imaging   Bone density- This patient is considered osteopenic according to [facility]) criteria.       CANCER STAGING: Cancer Staging Breast cancer, right breast ([place]) Staging form: Breast, AJCC 8th Edition - Pathologic stage from 05/27/2015: Stage IB (pT1b, pN0, cM0, G1, ER: Positive, PR: Negative, HER2: Negative) - Signed by Baird Cancer, PA-C on 03/25/2016    INTERVAL HISTORY:  [name] 85 y.o. female seen for follow-up of breast cancer, osteopenia and anemia.  She underwent left distal ureterectomy in February by Dr. Alyson Ingles.  She  also received Feraheme in February 2020 and felt better.  Denies any bleeding per rectum or melena.  No hematuria noted.  She is tolerating anastrozole very well.  Has some joint pains which are stable.  Appetite is 100%.  Energy levels are low at this time.  REVIEW OF SYSTEMS:  Review of Systems  Gastrointestinal: Positive for constipation.  All other systems reviewed and are negative.    PAST MEDICAL/SURGICAL HISTORY:  Past Medical History:  Diagnosis Date  . Breast cancer ([place])   . Breast cancer, right breast ([place]) 05/01/2015  . Cancer (HCC)    BREAST CANCER   . Colon polyps   . COPD (chronic obstructive pulmonary disease) ([place])    DR. [place] Bells  . Diabetes mellitus without complication ([place])    PREDIABETIC  CONTROL W/ DIET  . Hypercholesteremia   . Hypertension   . Lung nodules   . Osteoarthritis   . Osteopenia 09/18/2013  . Osteoporosis   . Personal history of radiation therapy   . PONV (postoperative nausea and vomiting)    ' i get so sick with anesthesia"   . Shortness of breath dyspnea    W/ EXERTION  . Tick bite    3 years ago    Past Surgical History:  Procedure Laterality Date  . ABDOMINAL HYSTERECTOMY    . APPENDECTOMY    . BARTHOLIN GLAND CYST EXCISION    . BREAST EXCISIONAL BIOPSY Left    benign  . BREAST LUMPECTOMY WITH RADIOACTIVE SEED LOCALIZATION Right 05/23/2015   Procedure: RIGHT BREAST LUMPECTOMY WITH RADIOACTIVE SEED LOCALIZATION;  Surgeon: Fanny Skates, MD;  Location:  [facility] OR;  Service: General;  Laterality: Right;  . BREAST SURGERY     2000  LEFT BREAST  LUMP REMOVED  . cataract surgeries  Bilateral   . colonoscopy    . COLONOSCOPY  03/25/2011   Procedure: COLONOSCOPY;  Surgeon: Rogene Houston, MD;  Location: [facility];  Service: Endoscopy;  Laterality: N/A;  9:30 am  . CYSTOSCOPY W/ URETERAL STENT PLACEMENT Left 02/27/2018   Procedure: CYSTOSCOPY WITH LEFT RETROGRADE PYELOGRAM AND lEFT URETEROSCOPY WITH BIOPSY AND LEFT URETERAL STENT  PLACEMENT;  Surgeon: Cleon Gustin, MD;  Location: [facility] ORS;  Service: Urology;  Laterality: Left;  . left inguinal hernia repair    . multiple toe surgeries    . ROBOT ASSITED LAPAROSCOPIC NEPHROURETERECTOMY Left 05/11/2018   Procedure: XI ROBOT ASSITED LAPAROSCOPIC LEFT DISTAL URETERECTOMY, URETERAL  REIMPLANTATION, WITH PSOAS HITCH AND BOARI FLAP. PELVIC LYMPH NODE DISSCETION AND LEFT URETERAL STENT PLACEMENT;  Surgeon: Cleon Gustin, MD;  Location: [facility];  Service: Urology;  Laterality: Left;  . TONSILLECTOMY       SOCIAL HISTORY:  Social History   Socioeconomic History  . Marital status: Widowed    Spouse name: Not on file  . Number of children: Not on file  . Years of education: Not on file  . Highest education level: Not on file  Occupational History  . Not on file  Social Needs  . Financial resource strain: Not on file  . Food insecurity    Worry: Not on file    Inability: Not on file  . Transportation needs    Medical: Not on file    Non-medical: Not on file  Tobacco Use  . Smoking status: Former Smoker    Packs/day: 2.00    Years: 50.00    Pack years: 100.00    Types: Cigarettes    Quit date: 03/15/1988    Years since quitting: 30.7  . Smokeless tobacco: Never Used  Substance and Sexual Activity  . Alcohol use: Yes    Alcohol/week: 0.0 standard drinks    Comment: seldom   . Drug use: No  . Sexual activity: Not on file  Lifestyle  . Physical activity    Days per week: Not on file    Minutes per session: Not on file  . Stress: Not on file  Relationships  . Social Herbalist on phone: Not on file    Gets together: Not on file    Attends religious service: Not on file    Active member of club or organization: Not on file    Attends meetings of clubs or organizations: Not on file    Relationship status: Not on file  . Intimate partner violence    Fear of current or ex partner: Not on file    Emotionally abused: Not on file     Physically abused: Not on file    Forced sexual activity: Not on file  Other Topics Concern  . Not on file  Social History Narrative  . Not on file    FAMILY HISTORY:  Family History  Problem Relation Age of Onset  . Colon cancer Father     CURRENT MEDICATIONS:  Outpatient Encounter Medications as of 11/23/2018  Medication Sig  . amLODipine (NORVASC) 5 MG tablet TAKE ONE TABLET BY MOUTH ONCE DAILY.  Marland Kitchen anastrozole (ARIMIDEX) 1 MG tablet Take 1 tablet (1 mg total) by mouth daily. (Patient taking differently: Take 1 mg by mouth every evening. )  .  docusate sodium (COLACE) 100 MG capsule Take 200 mg by mouth daily.  . furosemide (LASIX) 20 MG tablet TAKE ONE TABLET BY MOUTH EVERY MORNING.  . nortriptyline (PAMELOR) 10 MG capsule TAKE 2 TO 3 CAPSULES BY MOUTH NIGHTLY.  Marland Kitchen OXYGEN Inhale 2 L into the lungs. As needed for activities requiring exertion, per patient uses very sparingly  . PROAIR RESPICLICK 027 (90 Base) MCG/ACT AEPB INHALE 1 PUFF INTO THE LUNGS EVERY 4 HOURS AS NEEDED. (Patient taking differently: Inhale 1 puff into the lungs every 4 (four) hours as needed (shortness of breath or wheezing). )  . STIOLTO RESPIMAT 2.5-2.5 MCG/ACT AERS INHALE 2 PUFFS INTO THE LUNGS ONCE DAILY. (Patient taking differently: Inhale 2 puffs into the lungs daily. )  . [DISCONTINUED] predniSONE (DELTASONE) 20 MG tablet Take 1 tablet (20 mg total) by mouth daily with breakfast.  . fluticasone (FLONASE) 50 MCG/ACT nasal spray Place 2 sprays into both nostrils daily as needed for allergies.   Marland Kitchen levocetirizine (XYZAL) 5 MG tablet   . polyethylene glycol (MIRALAX / GLYCOLAX) packet Take 17 g by mouth daily as needed for moderate constipation.   . triamcinolone cream (KENALOG) 0.1 % Apply 1 application topically 2 (two) times daily. (Patient not taking: Reported on 11/23/2018)  . [DISCONTINUED] influenza vaccine adjuvanted (FLUAD QUADRIVALENT) 0.5 ML injection Inject 0.5 mLs into the muscle once for 1 dose.  .  [DISCONTINUED] influenza vaccine adjuvanted (FLUAD QUADRIVALENT) 0.5 ML injection Inject 0.5 mLs into the muscle once for 1 dose.  . [DISCONTINUED] traMADol (ULTRAM) 50 MG tablet Take 1-2 tablets (50-100 mg total) by mouth every 6 (six) hours as needed for moderate pain or severe pain.  . [EXPIRED] influenza vaccine adjuvanted (FLUAD) injection 0.5 mL    No facility-administered encounter medications on file as of 11/23/2018.     ALLERGIES:  Allergies  Allergen Reactions  . Augmentin [Amoxicillin-Pot Clavulanate] Nausea And Vomiting    Has patient had a PCN reaction causing immediate rash, facial/tongue/throat swelling, SOB or lightheadedness with hypotension: No Has patient had a PCN reaction causing severe rash involving mucus membranes or skin necrosis: No Has patient had a PCN reaction that required hospitalization: No Has patient had a PCN reaction occurring within the last 10 years: Unknown If all of the above answers are "NO", then may proceed with Cephalosporin use.    Marland Kitchen Amoxicillin Diarrhea    Has patient had a PCN reaction causing immediate rash, facial/tongue/throat swelling, SOB or lightheadedness with hypotension: No Has patient had a PCN reaction causing severe rash involving mucus membranes or skin necrosis: No Has patient had a PCN reaction that required hospitalization: No Has patient had a PCN reaction occurring within the last 10 years: Unknown If all of the above answers are "NO", then may proceed with Cephalosporin use.   . Codeine Nausea And Vomiting  . Morphine Nausea And Vomiting  . Neomycin-Bacitracin Zn-Polymyx Itching and Rash    Unable to use Neosporin     PHYSICAL EXAM:  ECOG Performance status: 1  Vitals:   11/23/18 1204  BP: (!) 139/52  Pulse: 97  Resp: 18  Temp: 97.7 F (36.5 C)  SpO2: 100%   Filed Weights   11/23/18 1204  Weight: 138 lb 12.8 oz (63 kg)    Physical Exam Constitutional:      Appearance: Normal appearance. She is normal  weight.  Cardiovascular:     Rate and Rhythm: Normal rate and regular rhythm.     Heart sounds: Normal heart sounds.  Pulmonary:     Effort: Pulmonary effort is normal.     Breath sounds: Normal breath sounds.  Abdominal:     General: There is no distension.     Palpations: Abdomen is soft. There is no mass.  Musculoskeletal: Normal range of motion.  Skin:    General: Skin is warm and dry.  Neurological:     Mental Status: She is alert and oriented to person, place, and time. Mental status is at baseline.  Psychiatric:        Mood and Affect: Mood normal.        Behavior: Behavior normal.   Breast: No palpable masses, no skin changes or nipple discharge, no adenopathy.    LABORATORY DATA:  I have reviewed the labs as listed.  CBC    Component Value Date/Time   WBC 9.5 10/09/2018 0918   WBC 7.5 05/16/2018 0438   RBC 2.85 (L) 10/09/2018 0918   RBC 2.93 (L) 05/16/2018 0438   HGB 8.3 (L) 10/09/2018 0918   HCT 26.1 (L) 10/09/2018 0918   PLT 373 10/09/2018 0918   MCV 92 10/09/2018 0918   MCH 29.1 10/09/2018 0918   MCH 29.7 05/16/2018 0438   MCHC 31.8 10/09/2018 0918   MCHC 31.1 05/16/2018 0438   RDW 12.7 10/09/2018 0918   LYMPHSABS 1.5 10/09/2018 0918   MONOABS 0.7 05/16/2018 0438   EOSABS 0.2 10/09/2018 0918   BASOSABS 0.0 10/09/2018 0918   CMP Latest Ref Rng & Units 10/09/2018 05/29/2018 05/16/2018  Glucose 65 - 99 mg/dL 99 101(H) 148(H)  BUN 8 - 27 mg/dL 33(H) 29(H) 24(H)  Creatinine 0.57 - 1.00 mg/dL 1.84(H) 1.91(H) 1.77(H)  Sodium 134 - 144 mmol/L 141 142 140  Potassium 3.5 - 5.2 mmol/L 5.2 4.1 3.0(L)  Chloride 96 - 106 mmol/L 102 99 100  CO2 20 - 29 mmol/L '25 25 29  '$ Calcium 8.7 - 10.3 mg/dL 10.0 10.3 9.8  Total Protein 6.0 - 8.5 g/dL 6.6 - -  Total Bilirubin 0.0 - 1.2 mg/dL <0.2 - -  Alkaline Phos 39 - 117 IU/L 91 - -  AST 0 - 40 IU/L 17 - -  ALT 0 - 32 IU/L 16 - -       DIAGNOSTIC IMAGING:  I have independently reviewed the scans and discussed with the  patient.    ASSESSMENT & PLAN:   Breast cancer, right breast ([place]) 1.  Stage I right breast cancer: -Right breast lumpectomy on 05/23/2015, 0.7 cm IDC, grade 1, associated low-grade DCIS, margins negative, ER positive, PR negative, HER-2 negative, Ki-67 10%. -Anastrozole started in June 2017, she is tolerating well with occasional hot flashes. -Mammogram on 09/19/2018 was BI-RADS Category 2.  She will continue anastrozole.  She will come back in 6 months for follow-up. -She will receive flu vaccine today.  2.  Osteopenia: - DEXA scan on 08/02/2016 shows T score of -1.3. -DEXA scan on 10/12/2018 shows T score of -2.4. - She has worsening bone mineral density.  She was treated with Fosamax for over 10 years and discontinued in 2019. -She will continue calcium and vitamin D.  I have recommended starting on Prolia.  She does not have any dentition. -We talked about various side effects of Prolia in detail.  We will schedule her for it.  3.  Urothelial carcinoma, high-grade of the left distal ureter: - She underwent left distal ureteral resection on 05/11/2018 by Dr. Alyson Ingles. - She has PT2PN0 high-grade urothelial carcinoma, 0 out of 1 lymph  nodes involved. -She follows closely with Dr. Alyson Ingles and is scheduled for CT scan end of this month.  4.  Microcytic anemia: - Etiology is CKD and iron deficiency. -Last Feraheme was in February 2020.  She had improvement in energy levels. -Last hemoglobin was on 10/09/2018,  8.3. -We will give her 1 more infusion of Feraheme.  Total time spent is 40 minutes with more than 50% of the time spent face-to-face discussing treatment plan, counseling and coordination of care.    Orders placed this encounter:  Orders Placed This Encounter  Procedures  . Flu Vaccine QUAD High Dose(Fluad)  . CBC with Differential/Platelet  . Comprehensive metabolic panel  . Iron and TIBC  . Ferritin  . Vitamin B12  . Folate  . Copper, serum      Derek Jack, MD [place] [phone]

## 2018-11-23 NOTE — Patient Instructions (Addendum)
[facility] at [facility] Discharge Instructions  You were seen today by Dr. Katragadda. He went over your recent lab results. He will see you back in 3 months for labs and follow up.   Thank you for choosing [facility] at [facility] to provide your oncology and hematology care.  To afford each patient quality time with our provider, please arrive at least 15 minutes before your scheduled appointment time.   If you have a lab appointment with the Cancer Center please come in thru the  Main Entrance and check in at the main information desk  You need to re-schedule your appointment should you arrive 10 or more minutes late.  We strive to give you quality time with our providers, and arriving late affects you and other patients whose appointments are after yours.  Also, if you no show three or more times for appointments you may be dismissed from the clinic at the providers discretion.     Again, thank you for choosing [facility].  Our hope is that these requests will decrease the amount of time that you wait before being seen by our physicians.       _____________________________________________________________  Should you have questions after your visit to [facility], please contact our office at [phone] between the hours of 8:00 a.m. and 4:30 p.m.  Voicemails left after 4:00 p.m. will not be returned until the following business day.  For prescription refill requests, have your pharmacy contact our office and allow 72 hours.    Cancer Center Support Programs:   > Cancer Support Group  2nd Tuesday of the month 1pm-2pm, Journey Room    

## 2018-11-23 NOTE — Assessment & Plan Note (Signed)
1.  Stage I right breast cancer: -Right breast lumpectomy on 05/23/2015, 0.7 cm IDC, grade 1, associated low-grade DCIS, margins negative, ER positive, PR negative, HER-2 negative, Ki-67 10%. -Anastrozole started in June 2017, she is tolerating well with occasional hot flashes. -Mammogram on 09/19/2018 was BI-RADS Category 2.  She will continue anastrozole.  She will come back in 6 months for follow-up. -She will receive flu vaccine today.  2.  Osteopenia: - DEXA scan on 08/02/2016 shows T score of -1.3. -DEXA scan on 10/12/2018 shows T score of -2.4. - She has worsening bone mineral density.  She was treated with Fosamax for over 10 years and discontinued in 2019. -She will continue calcium and vitamin D.  I have recommended starting on Prolia.  She does not have any dentition. -We talked about various side effects of Prolia in detail.  We will schedule her for it.  3.  Urothelial carcinoma, high-grade of the left distal ureter: - She underwent left distal ureteral resection on 05/11/2018 by Dr. Alyson Ingles. - She has PT2PN0 high-grade urothelial carcinoma, 0 out of 1 lymph nodes involved. -She follows closely with Dr. Alyson Ingles and is scheduled for CT scan end of this month.  4.  Microcytic anemia: - Etiology is CKD and iron deficiency. -Last Feraheme was in February 2020.  She had improvement in energy levels. -Last hemoglobin was on 10/09/2018,  8.3. -We will give her 1 more infusion of Feraheme.

## 2018-11-30 ENCOUNTER — Inpatient Hospital Stay (HOSPITAL_COMMUNITY): Payer: Medicare Other

## 2018-11-30 ENCOUNTER — Other Ambulatory Visit: Payer: Self-pay

## 2018-11-30 ENCOUNTER — Encounter (HOSPITAL_COMMUNITY): Payer: Self-pay

## 2018-11-30 VITALS — BP 149/58 | HR 93 | Temp 98.6°F | Resp 18

## 2018-11-30 DIAGNOSIS — Z923 Personal history of irradiation: Secondary | ICD-10-CM | POA: Diagnosis not present

## 2018-11-30 DIAGNOSIS — Z170 Estrogen receptor positive status [ER+]: Secondary | ICD-10-CM | POA: Diagnosis not present

## 2018-11-30 DIAGNOSIS — C50911 Malignant neoplasm of unspecified site of right female breast: Secondary | ICD-10-CM

## 2018-11-30 DIAGNOSIS — C662 Malignant neoplasm of left ureter: Secondary | ICD-10-CM | POA: Diagnosis not present

## 2018-11-30 DIAGNOSIS — J9611 Chronic respiratory failure with hypoxia: Secondary | ICD-10-CM

## 2018-11-30 DIAGNOSIS — Z79811 Long term (current) use of aromatase inhibitors: Secondary | ICD-10-CM | POA: Diagnosis not present

## 2018-11-30 DIAGNOSIS — Z23 Encounter for immunization: Secondary | ICD-10-CM | POA: Diagnosis not present

## 2018-11-30 MED ORDER — SODIUM CHLORIDE 0.9 % IV SOLN
INTRAVENOUS | Status: DC
  Administered 2018-11-30: 14:00:00 via INTRAVENOUS

## 2018-11-30 MED ORDER — DENOSUMAB 60 MG/ML ~~LOC~~ SOSY: 60.0000 mg | PREFILLED_SYRINGE | Freq: Once | SUBCUTANEOUS | 0 refills | Status: AC

## 2018-11-30 MED ORDER — DENOSUMAB 60 MG/ML ~~LOC~~ SOSY
60.0000 mg | PREFILLED_SYRINGE | Freq: Once | SUBCUTANEOUS | Status: AC
  Administered 2018-11-30: 15:00:00 60 mg via SUBCUTANEOUS
  Filled 2018-11-30: qty 1

## 2018-11-30 MED ORDER — SODIUM CHLORIDE 0.9 % IV SOLN
510.0000 mg | Freq: Once | INTRAVENOUS | Status: AC
  Administered 2018-11-30: 510 mg via INTRAVENOUS
  Filled 2018-11-30: qty 510

## 2018-11-30 NOTE — Progress Notes (Signed)
Peripheral IV site checked with positive blood return noted prior to and after infusion

## 2018-11-30 NOTE — Patient Instructions (Signed)
[place] at [facility] Discharge Instructions  Received Feraheme infusion and Prolia injection today. Follow-up as scheduled. Call clinic for any questions or concerns   Thank you for choosing [place] at [facility] to provide your oncology and hematology care.  To afford each patient quality time with our provider, please arrive at least 15 minutes before your scheduled appointment time.   If you have a lab appointment with the [facility] please come in thru the Main Entrance and check in at the main information desk.  You need to re-schedule your appointment should you arrive 10 or more minutes late.  We strive to give you quality time with our providers, and arriving late affects you and other patients whose appointments are after yours.  Also, if you no show three or more times for appointments you may be dismissed from the clinic at the providers discretion.     Again, thank you for choosing [facility].  Our hope is that these requests will decrease the amount of time that you wait before being seen by our physicians.       _____________________________________________________________  Should you have questions after your visit to [facility], please contact our office at [phone] between the hours of 8:00 a.m. and 4:30 p.m.  Voicemails left after 4:00 p.m. will not be returned until the following business day.  For prescription refill requests, have your pharmacy contact our office and allow 72 hours.    Due to Covid, you will need to wear a mask upon entering the hospital. If you do not have a mask, a mask will be given to you at the Main Entrance upon arrival. For doctor visits, patients may have 1 support person with them. For treatment visits, patients can not have anyone with them due to social distancing guidelines and our immunocompromised population.

## 2018-11-30 NOTE — Progress Notes (Signed)
[name] tolerated Feraheme infusion and Prolia injection well without complaints or incident. Written information given to and reviewed with pt regarding Prolia injection with understanding verbalized.Last calcium was 10 and pt denied any jaw or tooth pain and pt has complete dentures.Pt instructed to start taking Calcium supplements with Vit D 1200 mg daily also VSS upon discharge. Pt discharged via wheelchair in satisfactory condition

## 2018-12-04 ENCOUNTER — Ambulatory Visit (HOSPITAL_COMMUNITY): Admission: RE | Admit: 2018-12-04 | Payer: Medicare Other | Source: Ambulatory Visit

## 2018-12-04 ENCOUNTER — Emergency Department (HOSPITAL_COMMUNITY): Payer: Medicare Other

## 2018-12-04 ENCOUNTER — Other Ambulatory Visit (HOSPITAL_COMMUNITY)
Admission: RE | Admit: 2018-12-04 | Discharge: 2018-12-04 | Disposition: A | Discharge status: Home / Self Care | Payer: Medicare Other | Source: Ambulatory Visit | Attending: Family Medicine | Admitting: Family Medicine

## 2018-12-04 ENCOUNTER — Encounter (HOSPITAL_COMMUNITY): Payer: Self-pay

## 2018-12-04 ENCOUNTER — Other Ambulatory Visit: Payer: Self-pay

## 2018-12-04 ENCOUNTER — Encounter: Payer: Self-pay | Admitting: Family Medicine

## 2018-12-04 ENCOUNTER — Ambulatory Visit (INDEPENDENT_AMBULATORY_CARE_PROVIDER_SITE_OTHER): Payer: Medicare Other | Admitting: Family Medicine

## 2018-12-04 ENCOUNTER — Inpatient Hospital Stay (HOSPITAL_COMMUNITY)
Admission: EM | Admit: 2018-12-04 | Discharge: 2018-12-15 | DRG: 330 | Disposition: A | Discharge status: Skilled Nursing Facility | Payer: Medicare Other | Source: Ambulatory Visit | Attending: Student | Admitting: Student

## 2018-12-04 VITALS — BP 124/62 | Temp 97.1°F | Ht 62.0 in | Wt 139.0 lb

## 2018-12-04 DIAGNOSIS — K660 Peritoneal adhesions (postprocedural) (postinfection): Secondary | ICD-10-CM | POA: Diagnosis present

## 2018-12-04 DIAGNOSIS — Z170 Estrogen receptor positive status [ER+]: Secondary | ICD-10-CM

## 2018-12-04 DIAGNOSIS — C180 Malignant neoplasm of cecum: Secondary | ICD-10-CM | POA: Diagnosis present

## 2018-12-04 DIAGNOSIS — E1136 Type 2 diabetes mellitus with diabetic cataract: Secondary | ICD-10-CM | POA: Diagnosis present

## 2018-12-04 DIAGNOSIS — E785 Hyperlipidemia, unspecified: Secondary | ICD-10-CM | POA: Diagnosis present

## 2018-12-04 DIAGNOSIS — M810 Age-related osteoporosis without current pathological fracture: Secondary | ICD-10-CM | POA: Diagnosis present

## 2018-12-04 DIAGNOSIS — D62 Acute posthemorrhagic anemia: Secondary | ICD-10-CM | POA: Diagnosis not present

## 2018-12-04 DIAGNOSIS — S36503A Unspecified injury of sigmoid colon, initial encounter: Secondary | ICD-10-CM | POA: Diagnosis not present

## 2018-12-04 DIAGNOSIS — E860 Dehydration: Secondary | ICD-10-CM | POA: Diagnosis not present

## 2018-12-04 DIAGNOSIS — D500 Iron deficiency anemia secondary to blood loss (chronic): Secondary | ICD-10-CM | POA: Diagnosis not present

## 2018-12-04 DIAGNOSIS — E876 Hypokalemia: Secondary | ICD-10-CM | POA: Diagnosis not present

## 2018-12-04 DIAGNOSIS — N184 Chronic kidney disease, stage 4 (severe): Secondary | ICD-10-CM | POA: Diagnosis not present

## 2018-12-04 DIAGNOSIS — C679 Malignant neoplasm of bladder, unspecified: Secondary | ICD-10-CM

## 2018-12-04 DIAGNOSIS — C662 Malignant neoplasm of left ureter: Secondary | ICD-10-CM | POA: Diagnosis not present

## 2018-12-04 DIAGNOSIS — M1710 Unilateral primary osteoarthritis, unspecified knee: Secondary | ICD-10-CM | POA: Diagnosis present

## 2018-12-04 DIAGNOSIS — J9610 Chronic respiratory failure, unspecified whether with hypoxia or hypercapnia: Secondary | ICD-10-CM | POA: Diagnosis present

## 2018-12-04 DIAGNOSIS — K7689 Other specified diseases of liver: Secondary | ICD-10-CM | POA: Diagnosis not present

## 2018-12-04 DIAGNOSIS — Z9049 Acquired absence of other specified parts of digestive tract: Secondary | ICD-10-CM

## 2018-12-04 DIAGNOSIS — R1031 Right lower quadrant pain: Secondary | ICD-10-CM | POA: Diagnosis not present

## 2018-12-04 DIAGNOSIS — C188 Malignant neoplasm of overlapping sites of colon: Principal | ICD-10-CM | POA: Diagnosis present

## 2018-12-04 DIAGNOSIS — Z885 Allergy status to narcotic agent status: Secondary | ICD-10-CM

## 2018-12-04 DIAGNOSIS — C50911 Malignant neoplasm of unspecified site of right female breast: Secondary | ICD-10-CM | POA: Diagnosis present

## 2018-12-04 DIAGNOSIS — Z20828 Contact with and (suspected) exposure to other viral communicable diseases: Secondary | ICD-10-CM | POA: Diagnosis present

## 2018-12-04 DIAGNOSIS — R531 Weakness: Secondary | ICD-10-CM | POA: Diagnosis not present

## 2018-12-04 DIAGNOSIS — Z853 Personal history of malignant neoplasm of breast: Secondary | ICD-10-CM

## 2018-12-04 DIAGNOSIS — Y838 Other surgical procedures as the cause of abnormal reaction of the patient, or of later complication, without mention of misadventure at the time of the procedure: Secondary | ICD-10-CM | POA: Diagnosis not present

## 2018-12-04 DIAGNOSIS — J439 Emphysema, unspecified: Secondary | ICD-10-CM | POA: Diagnosis present

## 2018-12-04 DIAGNOSIS — I6529 Occlusion and stenosis of unspecified carotid artery: Secondary | ICD-10-CM | POA: Diagnosis present

## 2018-12-04 DIAGNOSIS — E1142 Type 2 diabetes mellitus with diabetic polyneuropathy: Secondary | ICD-10-CM | POA: Diagnosis present

## 2018-12-04 DIAGNOSIS — J449 Chronic obstructive pulmonary disease, unspecified: Secondary | ICD-10-CM | POA: Diagnosis not present

## 2018-12-04 DIAGNOSIS — I10 Essential (primary) hypertension: Secondary | ICD-10-CM | POA: Diagnosis present

## 2018-12-04 DIAGNOSIS — Z923 Personal history of irradiation: Secondary | ICD-10-CM

## 2018-12-04 DIAGNOSIS — E1122 Type 2 diabetes mellitus with diabetic chronic kidney disease: Secondary | ICD-10-CM | POA: Diagnosis present

## 2018-12-04 DIAGNOSIS — I2510 Atherosclerotic heart disease of native coronary artery without angina pectoris: Secondary | ICD-10-CM | POA: Diagnosis present

## 2018-12-04 DIAGNOSIS — I129 Hypertensive chronic kidney disease with stage 1 through stage 4 chronic kidney disease, or unspecified chronic kidney disease: Secondary | ICD-10-CM | POA: Diagnosis present

## 2018-12-04 DIAGNOSIS — Z8551 Personal history of malignant neoplasm of bladder: Secondary | ICD-10-CM | POA: Diagnosis not present

## 2018-12-04 DIAGNOSIS — R1011 Right upper quadrant pain: Secondary | ICD-10-CM | POA: Diagnosis not present

## 2018-12-04 DIAGNOSIS — M1990 Unspecified osteoarthritis, unspecified site: Secondary | ICD-10-CM | POA: Diagnosis present

## 2018-12-04 DIAGNOSIS — D649 Anemia, unspecified: Secondary | ICD-10-CM | POA: Diagnosis present

## 2018-12-04 DIAGNOSIS — K6389 Other specified diseases of intestine: Secondary | ICD-10-CM | POA: Diagnosis not present

## 2018-12-04 DIAGNOSIS — Z9981 Dependence on supplemental oxygen: Secondary | ICD-10-CM

## 2018-12-04 DIAGNOSIS — Z881 Allergy status to other antibiotic agents status: Secondary | ICD-10-CM

## 2018-12-04 DIAGNOSIS — Z79899 Other long term (current) drug therapy: Secondary | ICD-10-CM

## 2018-12-04 DIAGNOSIS — K567 Ileus, unspecified: Secondary | ICD-10-CM | POA: Diagnosis not present

## 2018-12-04 DIAGNOSIS — K922 Gastrointestinal hemorrhage, unspecified: Secondary | ICD-10-CM

## 2018-12-04 DIAGNOSIS — Z800 Family history of malignant neoplasm of digestive organs: Secondary | ICD-10-CM

## 2018-12-04 DIAGNOSIS — Z8550 Personal history of malignant neoplasm of unspecified urinary tract organ: Secondary | ICD-10-CM

## 2018-12-04 DIAGNOSIS — E7800 Pure hypercholesterolemia, unspecified: Secondary | ICD-10-CM | POA: Diagnosis present

## 2018-12-04 DIAGNOSIS — Y92234 Operating room of hospital as the place of occurrence of the external cause: Secondary | ICD-10-CM | POA: Diagnosis not present

## 2018-12-04 DIAGNOSIS — D631 Anemia in chronic kidney disease: Secondary | ICD-10-CM | POA: Diagnosis present

## 2018-12-04 DIAGNOSIS — Z87891 Personal history of nicotine dependence: Secondary | ICD-10-CM

## 2018-12-04 LAB — IRON AND TIBC
Iron: 26 ug/dL — ABNORMAL LOW (ref 28–170)
Saturation Ratios: 10 % — ABNORMAL LOW (ref 10.4–31.8)
TIBC: 262 ug/dL (ref 250–450)
UIBC: 236 ug/dL

## 2018-12-04 LAB — OCCULT BLOOD, POC DEVICE: Fecal Occult Bld: NEGATIVE

## 2018-12-04 LAB — CBC WITH DIFFERENTIAL/PLATELET
Abs Immature Granulocytes: 0.11 10*3/uL — ABNORMAL HIGH (ref 0.00–0.07)
Basophils Absolute: 0 10*3/uL (ref 0.0–0.1)
Basophils Relative: 0 %
Eosinophils Absolute: 0.1 10*3/uL (ref 0.0–0.5)
Eosinophils Relative: 1 %
HCT: 19.3 % — ABNORMAL LOW (ref 36.0–46.0)
Hemoglobin: 5.5 g/dL — CL (ref 12.0–15.0)
Immature Granulocytes: 1 %
Lymphocytes Relative: 6 %
Lymphs Abs: 1 10*3/uL (ref 0.7–4.0)
MCH: 27.9 pg (ref 26.0–34.0)
MCHC: 28.5 g/dL — ABNORMAL LOW (ref 30.0–36.0)
MCV: 98 fL (ref 80.0–100.0)
Monocytes Absolute: 1.1 10*3/uL — ABNORMAL HIGH (ref 0.1–1.0)
Monocytes Relative: 6 %
Neutro Abs: 15 10*3/uL — ABNORMAL HIGH (ref 1.7–7.7)
Neutrophils Relative %: 86 %
Platelets: 510 10*3/uL — ABNORMAL HIGH (ref 150–400)
RBC: 1.97 MIL/uL — ABNORMAL LOW (ref 3.87–5.11)
RDW: 15.7 % — ABNORMAL HIGH (ref 11.5–15.5)
WBC: 17.3 10*3/uL — ABNORMAL HIGH (ref 4.0–10.5)
nRBC: 0 % (ref 0.0–0.2)

## 2018-12-04 LAB — BASIC METABOLIC PANEL
Anion gap: 9 (ref 5–15)
Anion gap: 9 (ref 5–15)
BUN: 34 mg/dL — ABNORMAL HIGH (ref 8–23)
BUN: 33 mg/dL — ABNORMAL HIGH (ref 8–23)
CO2: 23 mmol/L (ref 22–32)
CO2: 22 mmol/L (ref 22–32)
Calcium: 8.3 mg/dL — ABNORMAL LOW (ref 8.9–10.3)
Calcium: 8.1 mg/dL — ABNORMAL LOW (ref 8.9–10.3)
Chloride: 106 mmol/L (ref 98–111)
Chloride: 104 mmol/L (ref 98–111)
Creatinine, Ser: 1.95 mg/dL — ABNORMAL HIGH (ref 0.44–1.00)
Creatinine, Ser: 1.93 mg/dL — ABNORMAL HIGH (ref 0.44–1.00)
GFR calc Af Amer: 26 mL/min — ABNORMAL LOW (ref >60)
GFR calc Af Amer: 26 mL/min — ABNORMAL LOW (ref >60)
GFR calc non Af Amer: 22 mL/min — ABNORMAL LOW (ref >60)
GFR calc non Af Amer: 23 mL/min — ABNORMAL LOW (ref >60)
Glucose, Bld: 131 mg/dL — ABNORMAL HIGH (ref 70–99)
Glucose, Bld: 124 mg/dL — ABNORMAL HIGH (ref 70–99)
Potassium: 4.8 mmol/L (ref 3.5–5.1)
Potassium: 4.8 mmol/L (ref 3.5–5.1)
Sodium: 138 mmol/L (ref 135–145)
Sodium: 135 mmol/L (ref 135–145)

## 2018-12-04 LAB — CBC
HCT: 19 % — ABNORMAL LOW (ref 36.0–46.0)
Hemoglobin: 5.3 g/dL — CL (ref 12.0–15.0)
MCH: 27.7 pg (ref 26.0–34.0)
MCHC: 27.9 g/dL — ABNORMAL LOW (ref 30.0–36.0)
MCV: 99.5 fL (ref 80.0–100.0)
Platelets: 493 10*3/uL — ABNORMAL HIGH (ref 150–400)
RBC: 1.91 MIL/uL — ABNORMAL LOW (ref 3.87–5.11)
RDW: 15.9 % — ABNORMAL HIGH (ref 11.5–15.5)
WBC: 18.1 10*3/uL — ABNORMAL HIGH (ref 4.0–10.5)
nRBC: 0 % (ref 0.0–0.2)

## 2018-12-04 LAB — FERRITIN: Ferritin: 329 ng/mL — ABNORMAL HIGH (ref 11–307)

## 2018-12-04 LAB — PREPARE RBC (CROSSMATCH)

## 2018-12-04 LAB — RETICULOCYTES
Immature Retic Fract: 19.3 % — ABNORMAL HIGH (ref 2.3–15.9)
RBC.: 1.75 MIL/uL — ABNORMAL LOW (ref 3.87–5.11)
Retic Count, Absolute: 89.6 10*3/uL (ref 19.0–186.0)
Retic Ct Pct: 5.1 % — ABNORMAL HIGH (ref 0.4–3.1)

## 2018-12-04 LAB — HEPATIC FUNCTION PANEL
ALT: 12 U/L (ref 0–44)
AST: 17 U/L (ref 15–41)
Albumin: 3.1 g/dL — ABNORMAL LOW (ref 3.5–5.0)
Alkaline Phosphatase: 79 U/L (ref 38–126)
Bilirubin, Direct: <0.1 mg/dL (ref 0.0–0.2)
Total Bilirubin: 0.2 mg/dL — ABNORMAL LOW (ref 0.3–1.2)
Total Protein: 6.7 g/dL (ref 6.5–8.1)

## 2018-12-04 LAB — FOLATE: Folate: 38.7 ng/mL (ref >5.9)

## 2018-12-04 LAB — VITAMIN B12: Vitamin B-12: 435 pg/mL (ref 180–914)

## 2018-12-04 LAB — LIPASE, BLOOD: Lipase: 32 U/L (ref 11–51)

## 2018-12-04 MED ORDER — FUROSEMIDE 10 MG/ML IJ SOLN
20.0000 mg | Freq: Once | INTRAMUSCULAR | Status: DC
  Filled 2018-12-04: qty 2

## 2018-12-04 MED ORDER — ALBUTEROL SULFATE (2.5 MG/3ML) 0.083% IN NEBU: 3.0000 mL | INHALATION_SOLUTION | RESPIRATORY_TRACT | Status: DC | PRN

## 2018-12-04 MED ORDER — NORTRIPTYLINE HCL 10 MG PO CAPS
20.0000 mg | ORAL_CAPSULE | Freq: Every day | ORAL | Status: DC
  Administered 2018-12-05 – 2018-12-09 (×6): 30 mg via ORAL
  Filled 2018-12-04 (×3): qty 3
  Filled 2018-12-04: qty 2
  Filled 2018-12-04 (×7): qty 3

## 2018-12-04 MED ORDER — ARFORMOTEROL TARTRATE 15 MCG/2ML IN NEBU: 15.0000 ug | INHALATION_SOLUTION | Freq: Two times a day (BID) | RESPIRATORY_TRACT | Status: DC

## 2018-12-04 MED ORDER — POLYETHYLENE GLYCOL 3350 17 G PO PACK: 17.0000 g | PACK | Freq: Every day | ORAL | Status: DC | PRN

## 2018-12-04 MED ORDER — ARFORMOTEROL TARTRATE 15 MCG/2ML IN NEBU
15.0000 ug | INHALATION_SOLUTION | Freq: Two times a day (BID) | RESPIRATORY_TRACT | Status: DC
  Administered 2018-12-05 – 2018-12-15 (×21): 15 ug via RESPIRATORY_TRACT
  Filled 2018-12-04 (×21): qty 2

## 2018-12-04 MED ORDER — FUROSEMIDE 20 MG PO TABS
20.0000 mg | ORAL_TABLET | Freq: Every morning | ORAL | Status: DC
  Administered 2018-12-05 – 2018-12-14 (×8): 20 mg via ORAL
  Filled 2018-12-04 (×9): qty 1

## 2018-12-04 MED ORDER — ONDANSETRON HCL 4 MG PO TABS: 4.0000 mg | ORAL_TABLET | Freq: Four times a day (QID) | ORAL | Status: DC | PRN

## 2018-12-04 MED ORDER — ANASTROZOLE 1 MG PO TABS
1.0000 mg | ORAL_TABLET | Freq: Every evening | ORAL | Status: DC
  Administered 2018-12-05 – 2018-12-10 (×6): 1 mg via ORAL
  Filled 2018-12-04 (×7): qty 1

## 2018-12-04 MED ORDER — UMECLIDINIUM BROMIDE 62.5 MCG/INH IN AEPB
1.0000 | INHALATION_SPRAY | Freq: Every day | RESPIRATORY_TRACT | Status: DC
  Administered 2018-12-05 – 2018-12-07 (×3): 1 via RESPIRATORY_TRACT
  Filled 2018-12-04 (×2): qty 7

## 2018-12-04 MED ORDER — AMLODIPINE BESYLATE 5 MG PO TABS
5.0000 mg | ORAL_TABLET | Freq: Every day | ORAL | Status: DC
  Administered 2018-12-05 – 2018-12-15 (×9): 5 mg via ORAL
  Filled 2018-12-04 (×10): qty 1

## 2018-12-04 MED ORDER — ONDANSETRON HCL 4 MG/2ML IJ SOLN: 4.0000 mg | Freq: Four times a day (QID) | INTRAMUSCULAR | Status: DC | PRN

## 2018-12-04 MED ORDER — SODIUM CHLORIDE 0.9 % IV SOLN: 10.0000 mL/h | Freq: Once | INTRAVENOUS | Status: DC

## 2018-12-04 NOTE — ED Triage Notes (Signed)
Pt sent by Dr. Lance Sell office due to Hemoglobin of 5.8. History of chronic anemia. NAD.

## 2018-12-04 NOTE — H&P (Signed)
History and Physical    CATHERINA PATES TKZ:[id number] DOB: 1928-04-29 DOA: 12/04/2018  PCP: Kathyrn Drown, MD   Patient coming from: Home  I have personally briefly reviewed patient's old medical records in [place]  Chief Complaint: Right lower abdominal pain, low hemoglobin.  HPI: [name] is a 85 y.o. female with medical history significant for breast cancer, urothelial cancer involving the left ureter, coronary artery disease, hypertension, COPD, diabetes mellitus.  Patient presented today to see a primary care doctor with complaints of right lower abdominal pain that started 3 days ago and gradually progressed.  Pain is intermittent, nagging/burning.  No black stools, no bloody stools.  No vomiting, no loose stools, good p.o. intake.  No fevers, no urinary symptoms.  Patient reports chronic difficulty breathing, but also reports feeling easily winded with little activity over the past few days.  No chest pain.   Since her primary care provider today, CT abdomen and pelvis was ordered without contrast-suggested adenocarcinoma of the colon with nodal metastasis, blood work showed hemoglobin of 5.5, patient was referred to the ED  ED Course: Stable vitals.  Hemoglobin 5.3.  WBC 18.1.  Creatinine about baseline 1.93.  Lipase 32.  2 units of blood ordered for transfusion.  Hospitalist admit for acute anemia.  Review of Systems: As per HPI all other systems reviewed and negative.  Past Medical History:  Diagnosis Date   Breast cancer [facility])    Breast cancer, right breast ([place]) 05/01/2015   Cancer ([place])    BREAST CANCER    Colon polyps    COPD (chronic obstructive pulmonary disease) ([place])    DR. [place] Bells   Diabetes mellitus without complication ([place])    PREDIABETIC  CONTROL W/ DIET   Hypercholesteremia    Hypertension    Lung nodules    Osteoarthritis    Osteopenia 09/18/2013   Osteoporosis    Personal history of radiation therapy    PONV  (postoperative nausea and vomiting)    ' i get so sick with anesthesia"    Shortness of breath dyspnea    W/ EXERTION   Tick bite    3 years ago     Past Surgical History:  Procedure Laterality Date   ABDOMINAL HYSTERECTOMY     APPENDECTOMY     BARTHOLIN GLAND CYST EXCISION     BREAST EXCISIONAL BIOPSY Left    benign   BREAST LUMPECTOMY WITH RADIOACTIVE SEED LOCALIZATION Right 05/23/2015   Procedure: RIGHT BREAST LUMPECTOMY WITH RADIOACTIVE SEED LOCALIZATION;  Surgeon: Fanny Skates, MD;  Location: [place];  Service: General;  Laterality: Right;   BREAST SURGERY     2000  LEFT BREAST  LUMP REMOVED   cataract surgeries  Bilateral    colonoscopy     COLONOSCOPY  03/25/2011   Procedure: COLONOSCOPY;  Surgeon: Rogene Houston, MD;  [facility];  Service: Endoscopy;  Laterality: N/A;  9:30 am   CYSTOSCOPY W/ URETERAL STENT PLACEMENT Left 02/27/2018   Procedure: CYSTOSCOPY WITH LEFT RETROGRADE PYELOGRAM AND lEFT URETEROSCOPY WITH BIOPSY AND LEFT URETERAL STENT PLACEMENT;  Surgeon: Cleon Gustin, MD;  Location: [facility];  Service: Urology;  Laterality: Left;   left inguinal hernia repair     multiple toe surgeries     ROBOT ASSITED LAPAROSCOPIC NEPHROURETERECTOMY Left 05/11/2018   Procedure: XI ROBOT ASSITED LAPAROSCOPIC LEFT DISTAL URETERECTOMY, URETERAL  REIMPLANTATION, WITH PSOAS HITCH AND BOARI FLAP. PELVIC LYMPH NODE DISSCETION AND LEFT URETERAL STENT PLACEMENT;  Surgeon:  McKenzie, Candee Furbish, MD;  Location: [facility];  Service: Urology;  Laterality: Left;   TONSILLECTOMY       reports that she quit smoking about 30 years ago. Her smoking use included cigarettes. She has a 100.00 pack-year smoking history. She has never used smokeless tobacco. She reports current alcohol use. She reports that she does not use drugs.  Allergies  Allergen Reactions   Augmentin [Amoxicillin-Pot Clavulanate] Nausea And Vomiting    Has patient had a PCN reaction causing  immediate rash, facial/tongue/throat swelling, SOB or lightheadedness with hypotension: No Has patient had a PCN reaction causing severe rash involving mucus membranes or skin necrosis: No Has patient had a PCN reaction that required hospitalization: No Has patient had a PCN reaction occurring within the last 10 years: Unknown If all of the above answers are "NO", then may proceed with Cephalosporin use.     Amoxicillin Diarrhea    Has patient had a PCN reaction causing immediate rash, facial/tongue/throat swelling, SOB or lightheadedness with hypotension: No Has patient had a PCN reaction causing severe rash involving mucus membranes or skin necrosis: No Has patient had a PCN reaction that required hospitalization: No Has patient had a PCN reaction occurring within the last 10 years: Unknown If all of the above answers are "NO", then may proceed with Cephalosporin use.    Codeine Nausea And Vomiting   Morphine Nausea And Vomiting   Neomycin-Bacitracin Zn-Polymyx Itching and Rash    Unable to use Neosporin    Family History  Problem Relation Age of Onset   Colon cancer Father     Prior to Admission medications   Medication Sig Start Date End Date Taking? Authorizing Provider  amLODipine (NORVASC) 5 MG tablet TAKE ONE TABLET BY MOUTH ONCE DAILY. Patient taking differently: Take 5 mg by mouth daily.  11/14/18  Yes Kathyrn Drown, MD  anastrozole (ARIMIDEX) 1 MG tablet Take 1 tablet (1 mg total) by mouth daily. Patient taking differently: Take 1 mg by mouth every evening.  11/04/17  Yes Derek Jack, MD  docusate sodium (COLACE) 100 MG capsule Take 200 mg by mouth every evening.    Yes [provider]  fluticasone (FLONASE) 50 MCG/ACT nasal spray Place 2 sprays into both nostrils daily as needed for allergies.    Yes [provider]  furosemide (LASIX) 20 MG tablet TAKE ONE TABLET BY MOUTH EVERY MORNING. Patient taking differently: Take 20 mg by mouth every  morning.  11/14/18  Yes Luking, Elayne Snare, MD  levocetirizine (XYZAL) 5 MG tablet Take 5 mg by mouth daily as needed for allergies.  10/27/18  Yes [provider]  nortriptyline (PAMELOR) 10 MG capsule TAKE 2 TO 3 CAPSULES BY MOUTH NIGHTLY. Patient taking differently: Take 20-30 mg by mouth at bedtime.  11/14/18  Yes Luking, Elayne Snare, MD  OXYGEN Inhale 2 L into the lungs. As needed for activities requiring exertion, per patient uses very sparingly   Yes [provider]  polyethylene glycol (MIRALAX / GLYCOLAX) packet Take 17 g by mouth daily as needed for moderate constipation.    Yes [provider]  PROAIR RESPICLICK 235 (90 Base) MCG/ACT AEPB INHALE 1 PUFF INTO THE LUNGS EVERY 4 HOURS AS NEEDED. Patient taking differently: Inhale 1 puff into the lungs every 4 (four) hours as needed (shortness of breath or wheezing).  03/24/18  Yes McQuaid, Ronie Spies, MD  STIOLTO RESPIMAT 2.5-2.5 MCG/ACT AERS INHALE 2 PUFFS INTO THE LUNGS ONCE DAILY. Patient taking differently:  Inhale 2 puffs into the lungs daily as needed (for shortness of breath).  12/22/17  Yes Juanito Doom, MD    Physical Exam: Vitals:   12/04/18 1730 12/04/18 1750 12/04/18 1800 12/04/18 1830  BP: (!) 131/59 (!) 140/58 (!) 138/58 139/72  Pulse: 87 92 92 92  Resp: 18 18    Temp: 99.4 F (37.4 C) 98.3 F (36.8 C)    TempSrc: Oral Oral    SpO2: 100% 98% 100% 100%  Weight:      Height:        Constitutional: NAD, calm, comfortable Vitals:   12/04/18 1730 12/04/18 1750 12/04/18 1800 12/04/18 1830  BP: (!) 131/59 (!) 140/58 (!) 138/58 139/72  Pulse: 87 92 92 92  Resp: 18 18    Temp: 99.4 F (37.4 C) 98.3 F (36.8 C)    TempSrc: Oral Oral    SpO2: 100% 98% 100% 100%  Weight:      Height:       Eyes: PERRL, lids and conjunctivae normal ENMT: Mucous membranes are moist. Posterior pharynx clear of any exudate or lesions.  Neck: normal, supple, no masses, no thyromegaly Respiratory: clear to auscultation  bilaterally, no wheezing, no crackles. Normal respiratory effort. No accessory muscle use.  Cardiovascular: Regular rate and rhythm, no murmurs / rubs / gallops. No extremity edema. 2+ pedal pulses.  Abdomen: no tenderness, no masses palpated. No hepatosplenomegaly. Bowel sounds positive.  Musculoskeletal: no clubbing / cyanosis. No joint deformity upper and lower extremities. Good ROM, no contractures. Normal muscle tone.  Skin: no rashes, lesions, ulcers. No induration Neurologic: CN 2-12 grossly intact. Strength 5/5 in all 4.  Psychiatric: Normal judgment and insight. Alert and oriented x 3. Normal mood.   Labs on Admission: I have personally reviewed following labs and imaging studies  CBC: Recent Labs  Lab 12/04/18 1205 12/04/18 1406  WBC 17.3* 18.1*  NEUTROABS 15.0*  --   HGB 5.5* 5.3*  HCT 19.3* 19.0*  MCV 98.0 99.5  PLT 510* 093*   Basic Metabolic Panel: Recent Labs  Lab 12/04/18 1205 12/04/18 1406  NA 138 135  K 4.8 4.8  CL 106 104  CO2 23 22  GLUCOSE 131* 124*  BUN 34* 33*  CREATININE 1.95* 1.93*  CALCIUM 8.3* 8.1*   Liver Function Tests: Recent Labs  Lab 12/04/18 1205  AST 17  ALT 12  ALKPHOS 79  BILITOT 0.2*  PROT 6.7  ALBUMIN 3.1*   Recent Labs  Lab 12/04/18 1205  LIPASE 32   Anemia Panel: Recent Labs    12/04/18 1702  VITAMINB12 435  FERRITIN 329*  TIBC 262  IRON 26*  RETICCTPCT 5.1*   Urine analysis:    Component Value Date/Time   COLORURINE YELLOW 12/30/2017 [place] 12/30/2017 1039   LABSPEC 1.012 12/30/2017 1039   PHURINE 5.0 12/30/2017 1039   GLUCOSEU NEGATIVE 12/30/2017 1039   HGBUR SMALL (A) 12/30/2017 1039   BILIRUBINUR NEGATIVE 12/30/2017 1039   KETONESUR NEGATIVE 12/30/2017 1039   PROTEINUR NEGATIVE 12/30/2017 1039   NITRITE NEGATIVE 12/30/2017 1039   LEUKOCYTESUR NEGATIVE 12/30/2017 1039    Radiological Exams on Admission: Ct Abdomen Pelvis Wo Contrast  Result Date: 12/04/2018 CLINICAL DATA:   85 year old female with right lower quadrant pain and tenderness. History of prior urothelial carcinoma on the left, right breast cancer. Status post left robotic assisted nephro ureterectomy. EXAM: CT ABDOMEN AND PELVIS WITHOUT CONTRAST TECHNIQUE: Multidetector CT imaging of the abdomen and pelvis was performed following  the standard protocol without IV contrast. COMPARISON:  Prior CT abdomen/pelvis 02/01/2018 FINDINGS: Lower chest: Essentially stable nonspecific ground-glass attenuation airspace opacity in the posterior right lower lobe presently measures 3.5 x 1.6 cm compared to 3.1 x 1.6 cm in 2019. this lesion was previously evaluated on PET dated April 03, 2018 and was not hypermetabolic. The lung bases are otherwise clear. The intracardiac blood pool is hypodense relative to the adjacent myocardium consistent with anemia. No pericardial effusion. Unremarkable distal thoracic esophagus. Hepatobiliary: Stable 2.5 cm low-attenuation lesion with associated dystrophic calcification arising from the posterior aspect of hepatic segment 7. Numerous additional circumscribed low-attenuation lesions are present scattered throughout the liver. No significant interval change in the size, number or configuration of these lesions. Although incompletely evaluated in the absence of intravenous contrast material, the stability is highly suggestive of a benign process. Gallbladder is unremarkable. No intra or extrahepatic biliary ductal dilatation. Pancreas: Unremarkable. No pancreatic ductal dilatation or surrounding inflammatory changes. Spleen: Normal in size without focal abnormality. Adrenals/Urinary Tract: No evidence of hydronephrosis. Stable low-attenuation lesion in the upper pole of the right kidney. No ureterectasis. The bladder is unremarkable. Stomach/Bowel: Unremarkable appearance of the stomach and duodenum. No evidence of bowel obstruction. There is a large colonic stool burden suggesting underlying  constipation. Marked masslike thickening present in the cecum. The cecal margin is slightly irregular and shaggy with what appears to be hazy infiltration of the adjacent pericolonic fat. At least 1 adjacent ileocecal lymph node is enlarged at 1 cm. Nearby right common iliac chain lymph nodes are also concerning although not technically enlarged by imaging criteria. Vascular/Lymphatic: Limited evaluation in the absence of intravenous contrast. Atherosclerotic vascular calcifications throughout the abdominal aorta. No aneurysm. Reproductive: Surgical changes of prior hysterectomy. Other: Small fat containing midline ventral hernia.  No ascites. Musculoskeletal: No acute fracture or aggressive appearing lytic or blastic osseous lesion. Multilevel degenerative disc disease. IMPRESSION: 1. Masslike thickening of the cecum with slightly ill-defined border and ileocecal and right common iliac lymphadenopathy. Findings are highly concerning for colon adenocarcinoma with nodal metastatic disease. 2. Multifocal circumscribed low-attenuation liver lesions demonstrate no significant interval change in size, number or configuration compared to prior imaging and are favored to be benign. No definite new lesion identified. 3. The intracardiac blood pool is hypodense relative to the adjacent myocardium consistent with anemia. 4. Additional ancillary findings as above without significant interval change. Electronically Signed   By: Jacqulynn Cadet M.D.   On: 12/04/2018 16:09    EKG None.   Assessment/Plan Active Problems:   Acute anemia   Acute on chronic symptomatic anemia- hemoglobin 5.5.  Chronic anemia thought to be combination of CKD and iron deficiency.  Last hemoglobin 8.3.  Follows with Dr. Delton Coombes, receives IV iron infusion.  Denies melena.  Stool FOBT negative.  Etiology of bleeding likely from colon mass. -Transfuse 2 units ordered in ED -CBC a.m. - Iv lasix 20mg  x 1  Right colonic mass- masslike  thickening of the cecum, with ileocecal and right common iliac lymphadenopathy.   Highly concerning for adenocarcinoma with nodal metastatic disease. -Oncology consult- outpatient versus inpatient work-up, also advanced age.  History of breast cancer stage I and high-grade left ureteral urothelial cancer-follows with Dr. Delton Coombes.  S/p right breast lumpectomy 2017 currently on anastrozole.  Left distal ureteral resection 2020, follows with Dr. Alyson Ingles.  Hypertension-stable. -Resume home Norvasc, Lasix.  Prediabetes mellitus/diabetes mellitus-random glucose 131.  Not on medication. -Fasting blood sugar  COPD-stable.  With chronic respiratory failure- 2L  PRN, currently on room air.  Sats 100%. -Resume home bronchodilators.  DVT prophylaxis: Scds Code Status: Full Family Communication: None at bedside Disposition Plan: 1- 2 days Consults called: Oncology Admission status: Obs, tele    Bethena Roys MD [facility]  12/04/2018, 7:35 PM

## 2018-12-04 NOTE — Progress Notes (Signed)
Subjective:    Patient ID: [name], female    DOB: 03/22/1928, 85 y.o.   MRN: [id number]  HPI  Patient arrives for a follow up on blood pressure patient take the medication watching diet closely staying physically active blood pressures been doing well  Patient is followed by hematology Dr. Delton Coombes as well as Dr. [facility] Bells urology for urethral cancer resection as well as bladder cancer she also has history of breast cancer she is on anastrozole for this She does have a known history of microcytic anemia due to chronic kidney disease and iron deficiency  her last hemoglobin before today was 8.3 Patient was given another dose of Feraheme July 2020     Patient states she has been having gas pain in lower right side since Friday. It has been really rough and she been in bed until today.  Patient relates ever since she got Prolia she is been having pain that she thought was in her right hip but over the weekend it was more in her abdomen relates pain discomfort kept her up at night denies high fever chills denies constipation denies bleeding denies dysuria urinary frequency PMH benign Has a history of bladder cancer and urethral cancer had surgery earlier this year Review of Systems  Constitutional: Negative for activity change, appetite change and fatigue.  HENT: Negative for congestion and rhinorrhea.   Respiratory: Negative for cough and shortness of breath.   Cardiovascular: Negative for chest pain and leg swelling.  Gastrointestinal: Positive for abdominal pain, constipation and nausea. Negative for diarrhea.  Endocrine: Negative for polydipsia and polyphagia.  Skin: Negative for color change.  Neurological: Negative for dizziness and weakness.  Psychiatric/Behavioral: Negative for behavioral problems and confusion.       Objective:   Physical Exam Vitals signs reviewed.  Constitutional:      General: She is not in acute distress. HENT:     Head: Normocephalic and  atraumatic.  Eyes:     General:        Right eye: No discharge.        Left eye: No discharge.  Neck:     Trachea: No tracheal deviation.  Cardiovascular:     Rate and Rhythm: Normal rate and regular rhythm.     Heart sounds: Normal heart sounds. No murmur.  Pulmonary:     Effort: Pulmonary effort is normal. No respiratory distress.     Breath sounds: Normal breath sounds.  Abdominal:     Palpations: There is no mass.     Tenderness: There is abdominal tenderness.     Hernia: No hernia is present.  Lymphadenopathy:     Cervical: No cervical adenopathy.  Skin:    General: Skin is warm and dry.  Neurological:     Mental Status: She is alert.     Coordination: Coordination normal.  Psychiatric:        Behavior: Behavior normal.           Assessment & Plan:  Severe right lower quadrant tenderness pain and discomfort consistent with the possibility of diverticulitis versus abscess versus reoccurrence of tumor very important to do CAT scan because of her renal function cannot do IV contrast this needs to be stat  Blood pressure good control continue current measures  25 minutes was spent with the patient.  This statement verifies that 25 minutes was indeed spent with the patient.  More than 50% of this visit-total duration of the visit-was spent in counseling and coordination  of care. The issues that the patient came in for today as reflected in the diagnosis (s) please refer to documentation for further details. Await the results of the CAT scan  Because of her significant lower abdominal pain as well as her multiple other health issues it was felt necessary for her to do stat lab work as well as a stat CT scan  It should be noted that this patient is also having significant anemia based on her lab work.  Hemoglobin 5.5 this is in a critical range.  I did speak with the ER doctor.  They will be willing to go ahead and see the patient.  Patient more than likely will need blood  given to her as well as follow-up on CAT scan

## 2018-12-04 NOTE — ED Provider Notes (Signed)
[facility] EMERGENCY DEPARTMENT Provider Note   CSN: [id number] Arrival date & time: 12/04/18  1349     History   Chief Complaint Chief Complaint  Patient presents with  . Low Hemoglobin    HPI KAYDYN CHISM is a 85 y.o. female.     Patient presents with weakness.  And some abdominal pain she was seen by her primary care doctor who checked her blood count and her hemoglobin was 5.  So she was sent to the emergency department for further evaluation and admission  The history is provided by the patient. No language interpreter was used.  Weakness Severity:  Moderate Onset quality:  Sudden Timing:  Constant Progression:  Worsening Chronicity:  New Context: not alcohol use   Relieved by:  Nothing Worsened by:  Nothing Ineffective treatments:  None tried Associated symptoms: no abdominal pain, no chest pain, no cough, no diarrhea, no frequency, no headaches and no seizures     Past Medical History:  Diagnosis Date  . Breast cancer ([place])   . Breast cancer, right breast ([place]) 05/01/2015  . Cancer (HCC)    BREAST CANCER   . Colon polyps   . COPD (chronic obstructive pulmonary disease) ([place])    DR. [place]  . Diabetes mellitus without complication ([place])    PREDIABETIC  CONTROL W/ DIET  . Hypercholesteremia   . Hypertension   . Lung nodules   . Osteoarthritis   . Osteopenia 09/18/2013  . Osteoporosis   . Personal history of radiation therapy   . PONV (postoperative nausea and vomiting)    ' i get so sick with anesthesia"   . Shortness of breath dyspnea    W/ EXERTION  . Tick bite    3 years ago     Patient Active Problem List   Diagnosis Date Noted  . Acute anemia 12/04/2018  . Ureteral mass 05/11/2018  . Bladder cancer ([place]) 03/20/2018  . Chronic respiratory failure with hypoxia ([place]) 08/13/2016  . Pulmonary nodule 07/19/2016  . Renal bruit 06/29/2016  . Dyspnea 05/04/2016  . COPD (chronic obstructive pulmonary disease) ([place]) 04/26/2016  . Breast  cancer, right breast ([place]) 05/01/2015  . Osteoarthritis of both knees 04/21/2015  . Acute bronchitis with COPD ([place]) 03/21/2015  . Abnormal CXR 01/23/2015  . Prediabetes 10/15/2014  . Osteopenia 09/18/2013  . Peripheral neuropathy 08/13/2013  . Hyperglycemia 08/13/2013  . Essential hypertension, benign 09/20/2012  . Carotid artery disease ([place]) 09/20/2012  . Hyperlipidemia 09/20/2012  . Arthritis of right knee 06/08/2007  . KNEE PAIN 06/08/2007    Past Surgical History:  Procedure Laterality Date  . ABDOMINAL HYSTERECTOMY    . APPENDECTOMY    . BARTHOLIN GLAND CYST EXCISION    . BREAST EXCISIONAL BIOPSY Left    benign  . BREAST LUMPECTOMY WITH RADIOACTIVE SEED LOCALIZATION Right 05/23/2015   Procedure: RIGHT BREAST LUMPECTOMY WITH RADIOACTIVE SEED LOCALIZATION;  Surgeon: Fanny Skates, MD;  Location: [place];  Service: General;  Laterality: Right;  . BREAST SURGERY     2000  LEFT BREAST  LUMP REMOVED  . cataract surgeries  Bilateral   . colonoscopy    . COLONOSCOPY  03/25/2011   Procedure: COLONOSCOPY;  Surgeon: Rogene Houston, MD;  Location: [facility];  Service: Endoscopy;  Laterality: N/A;  9:30 am  . CYSTOSCOPY W/ URETERAL STENT PLACEMENT Left 02/27/2018   Procedure: CYSTOSCOPY WITH LEFT RETROGRADE PYELOGRAM AND lEFT URETEROSCOPY WITH BIOPSY AND LEFT URETERAL STENT PLACEMENT;  Surgeon: Cleon Gustin, MD;  Location: [facility];  Service: Urology;  Laterality: Left;  . left inguinal hernia repair    . multiple toe surgeries    . ROBOT ASSITED LAPAROSCOPIC NEPHROURETERECTOMY Left 05/11/2018   Procedure: XI ROBOT ASSITED LAPAROSCOPIC LEFT DISTAL URETERECTOMY, URETERAL  REIMPLANTATION, WITH PSOAS HITCH AND BOARI FLAP. PELVIC LYMPH NODE DISSCETION AND LEFT URETERAL STENT PLACEMENT;  Surgeon: Cleon Gustin, MD;  Location: [facility];  Service: Urology;  Laterality: Left;  . TONSILLECTOMY       OB History   No obstetric history on file.      Home Medications    Prior to  Admission medications   Medication Sig Start Date End Date Taking? Authorizing Provider  amLODipine (NORVASC) 5 MG tablet TAKE ONE TABLET BY MOUTH ONCE DAILY. Patient taking differently: Take 5 mg by mouth daily.  11/14/18  Yes Kathyrn Drown, MD  anastrozole (ARIMIDEX) 1 MG tablet Take 1 tablet (1 mg total) by mouth daily. Patient taking differently: Take 1 mg by mouth every evening.  11/04/17  Yes Derek Jack, MD  docusate sodium (COLACE) 100 MG capsule Take 200 mg by mouth every evening.    Yes [provider]  fluticasone (FLONASE) 50 MCG/ACT nasal spray Place 2 sprays into both nostrils daily as needed for allergies.    Yes [provider]  furosemide (LASIX) 20 MG tablet TAKE ONE TABLET BY MOUTH EVERY MORNING. Patient taking differently: Take 20 mg by mouth every morning.  11/14/18  Yes Luking, Elayne Snare, MD  levocetirizine (XYZAL) 5 MG tablet Take 5 mg by mouth daily as needed for allergies.  10/27/18  Yes [provider]  nortriptyline (PAMELOR) 10 MG capsule TAKE 2 TO 3 CAPSULES BY MOUTH NIGHTLY. Patient taking differently: Take 20-30 mg by mouth at bedtime.  11/14/18  Yes Luking, Elayne Snare, MD  OXYGEN Inhale 2 L into the lungs. As needed for activities requiring exertion, per patient uses very sparingly   Yes [provider]  polyethylene glycol (MIRALAX / GLYCOLAX) packet Take 17 g by mouth daily as needed for moderate constipation.    Yes [provider]  PROAIR RESPICLICK 409 (90 Base) MCG/ACT AEPB INHALE 1 PUFF INTO THE LUNGS EVERY 4 HOURS AS NEEDED. Patient taking differently: Inhale 1 puff into the lungs every 4 (four) hours as needed (shortness of breath or wheezing).  03/24/18  Yes McQuaid, Ronie Spies, MD  STIOLTO RESPIMAT 2.5-2.5 MCG/ACT AERS INHALE 2 PUFFS INTO THE LUNGS ONCE DAILY. Patient taking differently: Inhale 2 puffs into the lungs daily as needed (for shortness of breath).  12/22/17  Yes Juanito Doom, MD    Family  History Family History  Problem Relation Age of Onset  . Colon cancer Father     Social History Social History   Tobacco Use  . Smoking status: Former Smoker    Packs/day: 2.00    Years: 50.00    Pack years: 100.00    Types: Cigarettes    Quit date: 03/15/1988    Years since quitting: 30.7  . Smokeless tobacco: Never Used  Substance Use Topics  . Alcohol use: Yes    Alcohol/week: 0.0 standard drinks    Comment: seldom   . Drug use: No     Allergies   Augmentin [amoxicillin-pot clavulanate], Amoxicillin, Codeine, Morphine, and Neomycin-bacitracin zn-polymyx   Review of Systems Review of Systems  Constitutional: Negative for appetite change and fatigue.  HENT: Negative for congestion, ear discharge and sinus pressure.   Eyes: Negative for  discharge.  Respiratory: Negative for cough.   Cardiovascular: Negative for chest pain.  Gastrointestinal: Negative for abdominal pain and diarrhea.  Genitourinary: Negative for frequency and hematuria.  Musculoskeletal: Negative for back pain.  Skin: Negative for rash.  Neurological: Positive for weakness. Negative for seizures and headaches.  Psychiatric/Behavioral: Negative for hallucinations.  All other systems reviewed and are negative.    Physical Exam Updated Vital Signs BP 138/61 (BP Location: Left Arm)   Pulse 92   Temp 97.9 F (36.6 C) (Oral)   Resp 16   Ht 5\' 4"  (1.626 m)   Wt 62.1 kg   SpO2 100%   BMI 23.52 kg/m   Physical Exam Vitals signs and nursing note reviewed.  Constitutional:      Appearance: She is well-developed.  HENT:     Head: Normocephalic.     Nose: Nose normal.  Eyes:     General: No scleral icterus.    Conjunctiva/sclera: Conjunctivae normal.  Neck:     Musculoskeletal: Neck supple.     Thyroid: No thyromegaly.  Cardiovascular:     Rate and Rhythm: Normal rate and regular rhythm.     Heart sounds: No murmur. No friction rub. No gallop.   Pulmonary:     Breath sounds: No stridor. No  wheezing or rales.  Chest:     Chest wall: No tenderness.  Abdominal:     General: There is no distension.     Tenderness: There is abdominal tenderness. There is no rebound.  Musculoskeletal: Normal range of motion.  Lymphadenopathy:     Cervical: No cervical adenopathy.  Skin:    Findings: No erythema or rash.  Neurological:     Mental Status: She is oriented to person, place, and time.     Motor: No abnormal muscle tone.     Coordination: Coordination normal.  Psychiatric:        Behavior: Behavior normal.      ED Treatments / Results  Labs (all labs ordered are listed, but only abnormal results are displayed) Labs Reviewed  CBC - Abnormal; Notable for the following components:      Result Value   WBC 18.1 (*)    RBC 1.91 (*)    Hemoglobin 5.3 (*)    HCT 19.0 (*)    MCHC 27.9 (*)    RDW 15.9 (*)    Platelets 493 (*)    All other components within normal limits  BASIC METABOLIC PANEL - Abnormal; Notable for the following components:   Glucose, Bld 124 (*)    BUN 33 (*)    Creatinine, Ser 1.93 (*)    Calcium 8.1 (*)    GFR calc non Af Amer 23 (*)    GFR calc Af Amer 26 (*)    All other components within normal limits  RETICULOCYTES - Abnormal; Notable for the following components:   Retic Ct Pct 5.1 (*)    RBC. 1.75 (*)    Immature Retic Fract 19.3 (*)    All other components within normal limits  SARS CORONAVIRUS 2 (TAT 6-24 HRS)  VITAMIN B12  FOLATE  IRON AND TIBC  FERRITIN  OCCULT BLOOD, POC DEVICE  POC OCCULT BLOOD, ED  TYPE AND SCREEN  PREPARE RBC (CROSSMATCH)    EKG None  Radiology Ct Abdomen Pelvis Wo Contrast  Result Date: 12/04/2018 CLINICAL DATA:  85 year old female with right lower quadrant pain and tenderness. History of prior urothelial carcinoma on the left, right breast cancer. Status post left robotic  assisted nephro ureterectomy. EXAM: CT ABDOMEN AND PELVIS WITHOUT CONTRAST TECHNIQUE: Multidetector CT imaging of the abdomen and pelvis  was performed following the standard protocol without IV contrast. COMPARISON:  Prior CT abdomen/pelvis 02/01/2018 FINDINGS: Lower chest: Essentially stable nonspecific ground-glass attenuation airspace opacity in the posterior right lower lobe presently measures 3.5 x 1.6 cm compared to 3.1 x 1.6 cm in 2019. this lesion was previously evaluated on PET dated April 03, 2018 and was not hypermetabolic. The lung bases are otherwise clear. The intracardiac blood pool is hypodense relative to the adjacent myocardium consistent with anemia. No pericardial effusion. Unremarkable distal thoracic esophagus. Hepatobiliary: Stable 2.5 cm low-attenuation lesion with associated dystrophic calcification arising from the posterior aspect of hepatic segment 7. Numerous additional circumscribed low-attenuation lesions are present scattered throughout the liver. No significant interval change in the size, number or configuration of these lesions. Although incompletely evaluated in the absence of intravenous contrast material, the stability is highly suggestive of a benign process. Gallbladder is unremarkable. No intra or extrahepatic biliary ductal dilatation. Pancreas: Unremarkable. No pancreatic ductal dilatation or surrounding inflammatory changes. Spleen: Normal in size without focal abnormality. Adrenals/Urinary Tract: No evidence of hydronephrosis. Stable low-attenuation lesion in the upper pole of the right kidney. No ureterectasis. The bladder is unremarkable. Stomach/Bowel: Unremarkable appearance of the stomach and duodenum. No evidence of bowel obstruction. There is a large colonic stool burden suggesting underlying constipation. Marked masslike thickening present in the cecum. The cecal margin is slightly irregular and shaggy with what appears to be hazy infiltration of the adjacent pericolonic fat. At least 1 adjacent ileocecal lymph node is enlarged at 1 cm. Nearby right common iliac chain lymph nodes are also  concerning although not technically enlarged by imaging criteria. Vascular/Lymphatic: Limited evaluation in the absence of intravenous contrast. Atherosclerotic vascular calcifications throughout the abdominal aorta. No aneurysm. Reproductive: Surgical changes of prior hysterectomy. Other: Small fat containing midline ventral hernia.  No ascites. Musculoskeletal: No acute fracture or aggressive appearing lytic or blastic osseous lesion. Multilevel degenerative disc disease. IMPRESSION: 1. Masslike thickening of the cecum with slightly ill-defined border and ileocecal and right common iliac lymphadenopathy. Findings are highly concerning for colon adenocarcinoma with nodal metastatic disease. 2. Multifocal circumscribed low-attenuation liver lesions demonstrate no significant interval change in size, number or configuration compared to prior imaging and are favored to be benign. No definite new lesion identified. 3. The intracardiac blood pool is hypodense relative to the adjacent myocardium consistent with anemia. 4. Additional ancillary findings as above without significant interval change. Electronically Signed   By: Jacqulynn Cadet M.D.   On: 12/04/2018 16:09    Procedures Procedures (including critical care time)  Medications Ordered in ED Medications  0.9 %  sodium chloride infusion (has no administration in time range)     Initial Impression / Assessment and Plan / ED Course  I have reviewed the triage vital signs and the nursing notes.  Pertinent labs & imaging results that were available during my care of the patient were reviewed by me and considered in my medical decision making (see chart for details).        CRITICAL CARE Performed by: Milton Ferguson Total critical care time:45 minutes Critical care time was exclusive of separately billable procedures and treating other patients. Critical care was necessary to treat or prevent imminent or life-threatening deterioration.  Critical care was time spent personally by me on the following activities: development of treatment plan with patient and/or surrogate as well as nursing, discussions  with consultants, evaluation of patient's response to treatment, examination of patient, obtaining history from patient or surrogate, ordering and performing treatments and interventions, ordering and review of laboratory studies, ordering and review of radiographic studies, pulse oximetry and re-evaluation of patient's condition.  Patient with severe anemia.  She will be transfused and admitted to the hospital.  Patient CT scan shows a mass in her colon Final Clinical Impressions(s) / ED Diagnoses   Final diagnoses:  Iron deficiency anemia due to chronic blood loss    ED Discharge Orders    None       Milton Ferguson, MD 12/04/18 1718

## 2018-12-04 NOTE — ED Notes (Signed)
ED TO INPATIENT HANDOFF REPORT  ED Nurse Name and Phone #: Jamey Ripa, en [phone]  S Name/Age/Gender Tracey Harries 85 y.o. female Room/Bed: APA03/APA03  Code Status   Code Status: Prior  Home/SNF/Other Home Patient oriented to: self, place, time and situation Is this baseline? Yes   Triage Complete: Triage complete  Chief Complaint FROM RADIOLOGY  Triage Note Pt sent by Dr. Lance Sell office due to Hemoglobin of 5.8. History of chronic anemia. NAD.    Allergies Allergies  Allergen Reactions  . Augmentin [Amoxicillin-Pot Clavulanate] Nausea And Vomiting    Has patient had a PCN reaction causing immediate rash, facial/tongue/throat swelling, SOB or lightheadedness with hypotension: No Has patient had a PCN reaction causing severe rash involving mucus membranes or skin necrosis: No Has patient had a PCN reaction that required hospitalization: No Has patient had a PCN reaction occurring within the last 10 years: Unknown If all of the above answers are "NO", then may proceed with Cephalosporin use.    Marland Kitchen Amoxicillin Diarrhea    Has patient had a PCN reaction causing immediate rash, facial/tongue/throat swelling, SOB or lightheadedness with hypotension: No Has patient had a PCN reaction causing severe rash involving mucus membranes or skin necrosis: No Has patient had a PCN reaction that required hospitalization: No Has patient had a PCN reaction occurring within the last 10 years: Unknown If all of the above answers are "NO", then may proceed with Cephalosporin use.   . Codeine Nausea And Vomiting  . Morphine Nausea And Vomiting  . Neomycin-Bacitracin Zn-Polymyx Itching and Rash    Unable to use Neosporin    Level of Care/Admitting Diagnosis ED Disposition    ED Disposition Condition [facility]: [facility] [[id number]]  Level of Care: Telemetry [5]  Covid Evaluation: Asymptomatic Screening Protocol (No Symptoms)  Diagnosis: Acute anemia  [6629476]  Admitting Physician: Bethena Roys [phone]  Attending Physician: Bethena Roys Nessa.Cuff  PT Class (Do Not Modify): Observation [104]  PT Acc Code (Do Not Modify): Observation [10022]       B Medical/Surgery History Past Medical History:  Diagnosis Date  . Breast cancer ([place])   . Breast cancer, right breast ([place]) 05/01/2015  . Cancer ([facility])    BREAST CANCER   . Colon polyps   . COPD (chronic obstructive pulmonary disease) ([place])    DR. [place] Bells  . Diabetes mellitus without complication ([place])    PREDIABETIC  CONTROL W/ DIET  . Hypercholesteremia   . Hypertension   . Lung nodules   . Osteoarthritis   . Osteopenia 09/18/2013  . Osteoporosis   . Personal history of radiation therapy   . PONV (postoperative nausea and vomiting)    ' i get so sick with anesthesia"   . Shortness of breath dyspnea    W/ EXERTION  . Tick bite    3 years ago    Past Surgical History:  Procedure Laterality Date  . ABDOMINAL HYSTERECTOMY    . APPENDECTOMY    . BARTHOLIN GLAND CYST EXCISION    . BREAST EXCISIONAL BIOPSY Left    benign  . BREAST LUMPECTOMY WITH RADIOACTIVE SEED LOCALIZATION Right 05/23/2015   Procedure: RIGHT BREAST LUMPECTOMY WITH RADIOACTIVE SEED LOCALIZATION;  Surgeon: Fanny Skates, MD;  Location: [facility];  Service: General;  Laterality: Right;  . BREAST SURGERY     2000  LEFT BREAST  LUMP REMOVED  . cataract surgeries  Bilateral   . colonoscopy    . COLONOSCOPY  03/25/2011   Procedure: COLONOSCOPY;  Surgeon: Rogene Houston, MD;  [facility];  Service: Endoscopy;  Laterality: N/A;  9:30 am  . CYSTOSCOPY W/ URETERAL STENT PLACEMENT Left 02/27/2018   Procedure: CYSTOSCOPY WITH LEFT RETROGRADE PYELOGRAM AND lEFT URETEROSCOPY WITH BIOPSY AND LEFT URETERAL STENT PLACEMENT;  Surgeon: Cleon Gustin, MD;  Location: [facility];  Service: Urology;  Laterality: Left;  . left inguinal hernia repair    . multiple toe surgeries    . ROBOT ASSITED  LAPAROSCOPIC NEPHROURETERECTOMY Left 05/11/2018   Procedure: XI ROBOT ASSITED LAPAROSCOPIC LEFT DISTAL URETERECTOMY, URETERAL  REIMPLANTATION, WITH PSOAS HITCH AND BOARI FLAP. PELVIC LYMPH NODE DISSCETION AND LEFT URETERAL STENT PLACEMENT;  Surgeon: Cleon Gustin, MD;  Location: [facility];  Service: Urology;  Laterality: Left;  . TONSILLECTOMY       A IV Location/Drains/Wounds Patient Lines/Drains/Airways Status   Active Line/Drains/Airways    Name:   Placement date:   Placement time:   Site:   Days:   Peripheral IV 12/04/18 Right Antecubital   12/04/18    1720    Antecubital   less than 1   Urethral Catheter Dr. Alyson Ingles Latex;Triple-lumen 18 Fr.   05/11/18    0840    Latex;Triple-lumen   207   Ureteral Drain/Stent Left ureter 6 Fr.   05/11/18    1113    Left ureter   207   Incision (Closed) 05/11/18 Abdomen Other (Comment)   05/11/18    1215     207   Incision (Closed) 05/11/18 Perineum Other (Comment)   05/11/18    1215     207   Incision - 6 Ports Abdomen 1: Left;Lateral;Lower 2: Left;Lateral;Upper 3: Umbilicus;Superior 4: Right;Lateral;Lower 5: Right;Upper;Medial 6: Right;Lateral;Upper   05/11/18    0850     207          Intake/Output Last 24 hours No intake or output data in the 24 hours ending 12/04/18 1845  Labs/Imaging Results for orders placed or performed during the hospital encounter of 12/04/18 (from the past 48 hour(s))  Type and screen [facility]     Status: None (Preliminary result)   Collection Time: 12/04/18  2:06 PM  Result Value Ref Range   ABO/RH(D) A POS    Antibody Screen NEG    Sample Expiration 12/07/2018,2359    Unit Number [id number]    Blood Component Type RED CELLS,LR    Unit division 00    Status of Unit ALLOCATED    Transfusion Status OK TO TRANSFUSE    Crossmatch Result Compatible    Unit Number [id number]    Blood Component Type RBC LR PHER2    Unit division 00    Status of Unit ISSUED    Transfusion Status OK TO TRANSFUSE     Crossmatch Result      Compatible Performed at [facility], [address] [name], [place] [address]   CBC     Status: Abnormal   Collection Time: 12/04/18  2:06 PM  Result Value Ref Range   WBC 18.1 (H) 4.0 - 10.5 K/uL   RBC 1.91 (L) 3.87 - 5.11 MIL/uL   Hemoglobin 5.3 (LL) 12.0 - 15.0 g/dL    Comment: REPEATED TO VERIFY THIS CRITICAL RESULT HAS VERIFIED AND BEEN CALLED TO SANDRA BETHEL BY HILLARY FLYNT ON 09 21 2020 AT 1554, AND HAS BEEN READ BACK.     HCT 19.0 (L) 36.0 - 46.0 %   MCV 99.5 80.0 -  100.0 fL   MCH 27.7 26.0 - 34.0 pg   MCHC 27.9 (L) 30.0 - 36.0 g/dL   RDW 15.9 (H) 11.5 - 15.5 %   Platelets 493 (H) 150 - 400 K/uL   nRBC 0.0 0.0 - 0.2 %    Comment: Performed at [facility], [address] [place], [place] [address]  Basic metabolic panel     Status: Abnormal   Collection Time: 12/04/18  2:06 PM  Result Value Ref Range   Sodium 135 135 - 145 mmol/L   Potassium 4.8 3.5 - 5.1 mmol/L   Chloride 104 98 - 111 mmol/L   CO2 22 22 - 32 mmol/L   Glucose, Bld 124 (H) 70 - 99 mg/dL   BUN 33 (H) 8 - 23 mg/dL   Creatinine, Ser 1.93 (H) 0.44 - 1.00 mg/dL   Calcium 8.1 (L) 8.9 - 10.3 mg/dL   GFR calc non Af Amer 23 (L) >60 mL/min   GFR calc Af Amer 26 (L) >60 mL/min   Anion gap 9 5 - 15    Comment: Performed at [facility], [address] [place], [place] [address]  Prepare RBC     Status: None   Collection Time: 12/04/18  2:06 PM  Result Value Ref Range   Order Confirmation      ORDER PROCESSED BY BLOOD BANK Performed at [facility], [address], [place], [place] [address]   Occult blood, poc device     Status: None   Collection Time: 12/04/18  4:36 PM  Result Value Ref Range   Fecal Occult Bld NEGATIVE NEGATIVE  Vitamin B12     Status: None   Collection Time: 12/04/18  5:02 PM  Result Value Ref Range   Vitamin B-12 435 180 - 914 pg/mL    Comment: (NOTE) This assay is not validated for testing neonatal or myeloproliferative syndrome specimens for Vitamin  B12 levels. Performed at [facility], [address] [place], [place] [address]   Iron and TIBC     Status: Abnormal   Collection Time: 12/04/18  5:02 PM  Result Value Ref Range   Iron 26 (L) 28 - 170 ug/dL   TIBC 262 250 - 450 ug/dL   Saturation Ratios 10 (L) 10.4 - 31.8 %   UIBC 236 ug/dL    Comment: Performed at [facility], [address], [place], [place] [address]  Ferritin     Status: Abnormal   Collection Time: 12/04/18  5:02 PM  Result Value Ref Range   Ferritin 329 (H) 11 - 307 ng/mL    Comment: Performed at [facility], [address], [place], [place] [address]  Reticulocytes     Status: Abnormal   Collection Time: 12/04/18  5:02 PM  Result Value Ref Range   Retic Ct Pct 5.1 (H) 0.4 - 3.1 %   RBC. 1.75 (L) 3.87 - 5.11 MIL/uL   Retic Count, Absolute 89.6 19.0 - 186.0 K/uL   Immature Retic Fract 19.3 (H) 2.3 - 15.9 %    Comment: Performed at [facility], [address], [place], [place] [address]   Ct Abdomen Pelvis Wo Contrast  Result Date: 12/04/2018 CLINICAL DATA:  85 year old female with right lower quadrant pain and tenderness. History of prior urothelial carcinoma on the left, right breast cancer. Status post left robotic assisted nephro ureterectomy. EXAM: CT ABDOMEN AND PELVIS WITHOUT CONTRAST TECHNIQUE: Multidetector CT imaging of the abdomen and pelvis was performed following the standard protocol without IV contrast. COMPARISON:  Prior CT abdomen/pelvis 02/01/2018 FINDINGS: Lower chest: Essentially stable nonspecific ground-glass attenuation airspace opacity in the posterior right lower lobe presently measures 3.5 x 1.6 cm compared to 3.1 x 1.6 cm in 2019. this lesion was previously evaluated on PET dated April 03, 2018 and was not hypermetabolic. The lung bases are otherwise clear. The intracardiac blood pool is hypodense relative to the adjacent myocardium consistent with anemia. No pericardial effusion. Unremarkable distal thoracic esophagus. Hepatobiliary:  Stable 2.5 cm low-attenuation lesion with associated dystrophic calcification arising from the posterior aspect of hepatic segment 7. Numerous additional circumscribed low-attenuation lesions are present scattered throughout the liver. No significant interval change in the size, number or configuration of these lesions. Although incompletely evaluated in the absence of intravenous contrast material, the stability is highly suggestive of a benign process. Gallbladder is unremarkable. No intra or extrahepatic biliary ductal dilatation. Pancreas: Unremarkable. No pancreatic ductal dilatation or surrounding inflammatory changes. Spleen: Normal in size without focal abnormality. Adrenals/Urinary Tract: No evidence of hydronephrosis. Stable low-attenuation lesion in the upper pole of the right kidney. No ureterectasis. The bladder is unremarkable. Stomach/Bowel: Unremarkable appearance of the stomach and duodenum. No evidence of bowel obstruction. There is a large colonic stool burden suggesting underlying constipation. Marked masslike thickening present in the cecum. The cecal margin is slightly irregular and shaggy with what appears to be hazy infiltration of the adjacent pericolonic fat. At least 1 adjacent ileocecal lymph node is enlarged at 1 cm. Nearby right common iliac chain lymph nodes are also concerning although not technically enlarged by imaging criteria. Vascular/Lymphatic: Limited evaluation in the absence of intravenous contrast. Atherosclerotic vascular calcifications throughout the abdominal aorta. No aneurysm. Reproductive: Surgical changes of prior hysterectomy. Other: Small fat containing midline ventral hernia.  No ascites. Musculoskeletal: No acute fracture or aggressive appearing lytic or blastic osseous lesion. Multilevel degenerative disc disease. IMPRESSION: 1. Masslike thickening of the cecum with slightly ill-defined border and ileocecal and right common iliac lymphadenopathy. Findings are  highly concerning for colon adenocarcinoma with nodal metastatic disease. 2. Multifocal circumscribed low-attenuation liver lesions demonstrate no significant interval change in size, number or configuration compared to prior imaging and are favored to be benign. No definite new lesion identified. 3. The intracardiac blood pool is hypodense relative to the adjacent myocardium consistent with anemia. 4. Additional ancillary findings as above without significant interval change. Electronically Signed   By: Jacqulynn Cadet M.D.   On: 12/04/2018 16:09    Pending Labs Unresulted Labs (From admission, onward)    Start     Ordered   12/04/18 1649  Folate  (Anemia Panel ([facility]))  ONCE - STAT,   STAT     12/04/18 1648   12/04/18 1639  SARS CORONAVIRUS 2 (TAT 6-24 HRS) Nasopharyngeal Nasopharyngeal Swab  (Asymptomatic/Tier 2 Patients Labs)  Once,   STAT    Question Answer Comment  Is this test for diagnosis or screening Screening   Symptomatic for COVID-19 as defined by CDC No   Hospitalized for COVID-19 No   Admitted to ICU for COVID-19 No   Previously tested for COVID-19 No   Resident in a congregate (group) care setting No   Employed in healthcare setting No   Pregnant No      12/04/18 1638   Signed and Held  CBC  Tomorrow morning,   R     Signed and Held          Vitals/Pain Today's Vitals   12/04/18 1730 12/04/18 1750 12/04/18 1800 12/04/18 1813  BP: (!) 131/59 (!) 140/58 (!) 138/58   Pulse: 87 92 92   Resp: 18 18    Temp: 99.4 F (37.4 C) 98.3 F (36.8 C)    TempSrc: Oral Oral    SpO2: 100% 98% 100%   Weight:      Height:      PainSc:    0-No pain    Isolation Precautions No active isolations  Medications Medications  0.9 %  sodium chloride infusion (has no administration in time range)    Mobility walks with device Low fall risk   Focused Assessments   R Recommendations: See Admitting Provider Note  Report given to:   Additional Notes:

## 2018-12-05 DIAGNOSIS — K660 Peritoneal adhesions (postprocedural) (postinfection): Secondary | ICD-10-CM | POA: Diagnosis not present

## 2018-12-05 DIAGNOSIS — E7800 Pure hypercholesterolemia, unspecified: Secondary | ICD-10-CM | POA: Diagnosis present

## 2018-12-05 DIAGNOSIS — J9610 Chronic respiratory failure, unspecified whether with hypoxia or hypercapnia: Secondary | ICD-10-CM | POA: Diagnosis present

## 2018-12-05 DIAGNOSIS — C172 Malignant neoplasm of ileum: Secondary | ICD-10-CM | POA: Diagnosis not present

## 2018-12-05 DIAGNOSIS — Z923 Personal history of irradiation: Secondary | ICD-10-CM | POA: Diagnosis not present

## 2018-12-05 DIAGNOSIS — Y92234 Operating room of hospital as the place of occurrence of the external cause: Secondary | ICD-10-CM | POA: Diagnosis not present

## 2018-12-05 DIAGNOSIS — M810 Age-related osteoporosis without current pathological fracture: Secondary | ICD-10-CM | POA: Diagnosis present

## 2018-12-05 DIAGNOSIS — Z48815 Encounter for surgical aftercare following surgery on the digestive system: Secondary | ICD-10-CM | POA: Diagnosis not present

## 2018-12-05 DIAGNOSIS — S36503A Unspecified injury of sigmoid colon, initial encounter: Secondary | ICD-10-CM | POA: Diagnosis not present

## 2018-12-05 DIAGNOSIS — K6289 Other specified diseases of anus and rectum: Secondary | ICD-10-CM | POA: Diagnosis not present

## 2018-12-05 DIAGNOSIS — E1122 Type 2 diabetes mellitus with diabetic chronic kidney disease: Secondary | ICD-10-CM | POA: Diagnosis present

## 2018-12-05 DIAGNOSIS — N184 Chronic kidney disease, stage 4 (severe): Secondary | ICD-10-CM | POA: Diagnosis present

## 2018-12-05 DIAGNOSIS — Z743 Need for continuous supervision: Secondary | ICD-10-CM | POA: Diagnosis not present

## 2018-12-05 DIAGNOSIS — M1990 Unspecified osteoarthritis, unspecified site: Secondary | ICD-10-CM | POA: Diagnosis present

## 2018-12-05 DIAGNOSIS — T7840XD Allergy, unspecified, subsequent encounter: Secondary | ICD-10-CM | POA: Diagnosis not present

## 2018-12-05 DIAGNOSIS — Z8554 Personal history of malignant neoplasm of ureter: Secondary | ICD-10-CM | POA: Diagnosis not present

## 2018-12-05 DIAGNOSIS — Z800 Family history of malignant neoplasm of digestive organs: Secondary | ICD-10-CM | POA: Diagnosis not present

## 2018-12-05 DIAGNOSIS — R933 Abnormal findings on diagnostic imaging of other parts of digestive tract: Secondary | ICD-10-CM | POA: Diagnosis not present

## 2018-12-05 DIAGNOSIS — I129 Hypertensive chronic kidney disease with stage 1 through stage 4 chronic kidney disease, or unspecified chronic kidney disease: Secondary | ICD-10-CM | POA: Diagnosis present

## 2018-12-05 DIAGNOSIS — Z20828 Contact with and (suspected) exposure to other viral communicable diseases: Secondary | ICD-10-CM | POA: Diagnosis present

## 2018-12-05 DIAGNOSIS — Z01818 Encounter for other preprocedural examination: Secondary | ICD-10-CM | POA: Diagnosis not present

## 2018-12-05 DIAGNOSIS — Z408 Encounter for other prophylactic surgery: Secondary | ICD-10-CM | POA: Diagnosis not present

## 2018-12-05 DIAGNOSIS — Z87891 Personal history of nicotine dependence: Secondary | ICD-10-CM | POA: Diagnosis not present

## 2018-12-05 DIAGNOSIS — D500 Iron deficiency anemia secondary to blood loss (chronic): Secondary | ICD-10-CM | POA: Diagnosis not present

## 2018-12-05 DIAGNOSIS — C180 Malignant neoplasm of cecum: Secondary | ICD-10-CM | POA: Diagnosis not present

## 2018-12-05 DIAGNOSIS — R41841 Cognitive communication deficit: Secondary | ICD-10-CM | POA: Diagnosis not present

## 2018-12-05 DIAGNOSIS — R278 Other lack of coordination: Secondary | ICD-10-CM | POA: Diagnosis not present

## 2018-12-05 DIAGNOSIS — R1011 Right upper quadrant pain: Secondary | ICD-10-CM | POA: Diagnosis not present

## 2018-12-05 DIAGNOSIS — K567 Ileus, unspecified: Secondary | ICD-10-CM | POA: Diagnosis not present

## 2018-12-05 DIAGNOSIS — Z79899 Other long term (current) drug therapy: Secondary | ICD-10-CM | POA: Diagnosis not present

## 2018-12-05 DIAGNOSIS — C182 Malignant neoplasm of ascending colon: Secondary | ICD-10-CM | POA: Diagnosis not present

## 2018-12-05 DIAGNOSIS — E785 Hyperlipidemia, unspecified: Secondary | ICD-10-CM | POA: Diagnosis present

## 2018-12-05 DIAGNOSIS — J449 Chronic obstructive pulmonary disease, unspecified: Secondary | ICD-10-CM

## 2018-12-05 DIAGNOSIS — M6281 Muscle weakness (generalized): Secondary | ICD-10-CM | POA: Diagnosis not present

## 2018-12-05 DIAGNOSIS — Z9049 Acquired absence of other specified parts of digestive tract: Secondary | ICD-10-CM | POA: Diagnosis not present

## 2018-12-05 DIAGNOSIS — J439 Emphysema, unspecified: Secondary | ICD-10-CM | POA: Diagnosis present

## 2018-12-05 DIAGNOSIS — D631 Anemia in chronic kidney disease: Secondary | ICD-10-CM | POA: Diagnosis present

## 2018-12-05 DIAGNOSIS — C50911 Malignant neoplasm of unspecified site of right female breast: Secondary | ICD-10-CM | POA: Diagnosis not present

## 2018-12-05 DIAGNOSIS — D509 Iron deficiency anemia, unspecified: Secondary | ICD-10-CM | POA: Diagnosis not present

## 2018-12-05 DIAGNOSIS — Z853 Personal history of malignant neoplasm of breast: Secondary | ICD-10-CM | POA: Diagnosis not present

## 2018-12-05 DIAGNOSIS — K6389 Other specified diseases of intestine: Secondary | ICD-10-CM | POA: Diagnosis not present

## 2018-12-05 DIAGNOSIS — I2510 Atherosclerotic heart disease of native coronary artery without angina pectoris: Secondary | ICD-10-CM | POA: Diagnosis not present

## 2018-12-05 DIAGNOSIS — Y838 Other surgical procedures as the cause of abnormal reaction of the patient, or of later complication, without mention of misadventure at the time of the procedure: Secondary | ICD-10-CM | POA: Diagnosis not present

## 2018-12-05 DIAGNOSIS — R279 Unspecified lack of coordination: Secondary | ICD-10-CM | POA: Diagnosis not present

## 2018-12-05 DIAGNOSIS — I10 Essential (primary) hypertension: Secondary | ICD-10-CM | POA: Diagnosis not present

## 2018-12-05 DIAGNOSIS — R531 Weakness: Secondary | ICD-10-CM | POA: Diagnosis not present

## 2018-12-05 DIAGNOSIS — C189 Malignant neoplasm of colon, unspecified: Secondary | ICD-10-CM | POA: Diagnosis not present

## 2018-12-05 DIAGNOSIS — Z881 Allergy status to other antibiotic agents status: Secondary | ICD-10-CM | POA: Diagnosis not present

## 2018-12-05 DIAGNOSIS — F329 Major depressive disorder, single episode, unspecified: Secondary | ICD-10-CM | POA: Diagnosis not present

## 2018-12-05 DIAGNOSIS — E1142 Type 2 diabetes mellitus with diabetic polyneuropathy: Secondary | ICD-10-CM | POA: Diagnosis present

## 2018-12-05 DIAGNOSIS — C662 Malignant neoplasm of left ureter: Secondary | ICD-10-CM | POA: Diagnosis not present

## 2018-12-05 DIAGNOSIS — R2681 Unsteadiness on feet: Secondary | ICD-10-CM | POA: Diagnosis not present

## 2018-12-05 DIAGNOSIS — D62 Acute posthemorrhagic anemia: Secondary | ICD-10-CM | POA: Diagnosis present

## 2018-12-05 DIAGNOSIS — C188 Malignant neoplasm of overlapping sites of colon: Secondary | ICD-10-CM | POA: Diagnosis present

## 2018-12-05 DIAGNOSIS — I6529 Occlusion and stenosis of unspecified carotid artery: Secondary | ICD-10-CM | POA: Diagnosis present

## 2018-12-05 DIAGNOSIS — E1136 Type 2 diabetes mellitus with diabetic cataract: Secondary | ICD-10-CM | POA: Diagnosis present

## 2018-12-05 DIAGNOSIS — D649 Anemia, unspecified: Secondary | ICD-10-CM | POA: Diagnosis not present

## 2018-12-05 DIAGNOSIS — D123 Benign neoplasm of transverse colon: Secondary | ICD-10-CM | POA: Diagnosis not present

## 2018-12-05 LAB — BPAM RBC
Blood Product Expiration Date: 202010192359
Blood Product Expiration Date: 202010192359
ISSUE DATE / TIME: 202009211732
ISSUE DATE / TIME: 202009212023
Unit Type and Rh: 6200
Unit Type and Rh: 6200

## 2018-12-05 LAB — GLUCOSE, CAPILLARY: Glucose-Capillary: 109 mg/dL — ABNORMAL HIGH (ref 70–99)

## 2018-12-05 LAB — TYPE AND SCREEN
ABO/RH(D): A POS
Antibody Screen: NEGATIVE
Unit division: 0
Unit division: 0

## 2018-12-05 LAB — LACTATE DEHYDROGENASE: LDH: 129 U/L (ref 98–192)

## 2018-12-05 LAB — CBC
HCT: 24.8 % — ABNORMAL LOW (ref 36.0–46.0)
Hemoglobin: 7.7 g/dL — ABNORMAL LOW (ref 12.0–15.0)
MCH: 28.8 pg (ref 26.0–34.0)
MCHC: 31 g/dL (ref 30.0–36.0)
MCV: 92.9 fL (ref 80.0–100.0)
Platelets: 412 10*3/uL — ABNORMAL HIGH (ref 150–400)
RBC: 2.67 MIL/uL — ABNORMAL LOW (ref 3.87–5.11)
RDW: 16.2 % — ABNORMAL HIGH (ref 11.5–15.5)
WBC: 15.5 10*3/uL — ABNORMAL HIGH (ref 4.0–10.5)
nRBC: 0 % (ref 0.0–0.2)

## 2018-12-05 LAB — SARS CORONAVIRUS 2 (TAT 6-24 HRS): SARS Coronavirus 2: NEGATIVE

## 2018-12-05 MED ORDER — SODIUM CHLORIDE 0.9 % IV SOLN
INTRAVENOUS | Status: DC
  Administered 2018-12-06: 14:00:00 1000 mL via INTRAVENOUS

## 2018-12-05 MED ORDER — POLYETHYLENE GLYCOL 3350 17 G PO PACK
17.0000 g | PACK | Freq: Once | ORAL | Status: AC
  Administered 2018-12-05: 22:00:00 17 g via ORAL
  Filled 2018-12-05: qty 1

## 2018-12-05 MED ORDER — PEG 3350-KCL-NA BICARB-NACL 420 G PO SOLR
4000.0000 mL | Freq: Once | ORAL | Status: AC
  Administered 2018-12-06: 06:00:00 4000 mL via ORAL

## 2018-12-05 MED ORDER — POLYETHYLENE GLYCOL 3350 17 G PO PACK
17.0000 g | PACK | Freq: Once | ORAL | Status: AC
  Administered 2018-12-05: 17 g via ORAL
  Filled 2018-12-05: qty 1

## 2018-12-05 NOTE — Progress Notes (Signed)
Notified pharmacy of missing medication Incruse

## 2018-12-05 NOTE — Progress Notes (Signed)
Consent signed for colonoscopy and in chart.

## 2018-12-05 NOTE — Consult Note (Signed)
Referring Provider: Kathie Dike, MD Primary Care Physician:  Kathyrn Drown, MD Primary Gastroenterologist:  Dr. Laural Golden  Reason for Consultation:    Cecal mass and iron deficiency anemia.  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 Nicki Reaper looking in July 2020. Patient was seen by Dr. Wolfgang Phoenix yesterday for right lower quadrant abdominal pain and she was noted to have hemoglobin of 5.5 g.  Dr. Wolfgang Phoenix felt that she needed urgent evaluation.  Patient was sent to emergency room for evaluation.  Abdominal pelvic CT without contrast revealed cecal mass with hazy infiltration of adjacent pericolonic fat as well as prominent ileocecal lymph nodes.  In addition common iliac chain lymph nodes are also enlarged.  Patient was admitted to Dr. Blythe Stanford service and has received 2 units of PRBCs. Patient states she has noted a gradual weakness over the last several weeks and would get tired easily.  However she did not have melena or rectal bleeding hematuria or vaginal bleeding.  She says her appetite has been very good and she has not lost any weight.  Her stool in the past has been guaiac negative.  Similarly Hemoccult from admission was negative.  She now complains of mild pain in the right lower quadrant of her abdomen.  She has not had any fever or chills.  She reports she has been urinating every hour since urologic surgery of February 2020.  She also has had some constipation.  Her GI history is as follows: Patient has history of colonic adenomas.  She had 3 adenomas removed in 2007. Her last colonoscopy was in January 2013 with removal of 3 small polyps.  One was lost and and of  the 2 polyps that were retrieved only one was tubular adenoma.  She was 82 at the time and was decided not to do surveillance colonoscopies anymore.  Her family history is significant for colon carcinoma in her father who was in late 17s at the time of diagnosis.  He ended up having permanent colostomy.  He lives 3 years after the diagnosis was made.  Her first cousin on father side was diagnosed with colon carcinoma in late 65s and died in early 68s.   Past Medical History:  Diagnosis Date  . Breast cancer ([place])   . Breast cancer, right breast ([place]) 05/01/2015  . Cancer ([facility])    BREAST CANCER   . Colon polyps   . COPD (chronic obstructive pulmonary disease) ([place])    DR. [place] Bells  . Diabetes mellitus without complication ([place])    PREDIABETIC  CONTROL W/ DIET  . Hypercholesteremia   . Hypertension   . Lung nodules   . Osteoarthritis   . Osteopenia 09/18/2013  . Osteoporosis   . Personal history of radiation therapy   . PONV (postoperative nausea and vomiting)    ' i get so sick with anesthesia"   . Shortness of breath dyspnea    W/ EXERTION  . Tick bite    3 years ago     Past Surgical History:  Procedure Laterality Date  . ABDOMINAL HYSTERECTOMY    . APPENDECTOMY    . BARTHOLIN GLAND CYST EXCISION    . BREAST EXCISIONAL BIOPSY Left  benign  . BREAST LUMPECTOMY WITH RADIOACTIVE SEED LOCALIZATION Right 05/23/2015   Procedure: RIGHT BREAST LUMPECTOMY WITH RADIOACTIVE SEED LOCALIZATION;  Surgeon: Fanny Skates, MD;  Location: [facility];  Service: General;  Laterality: Right;  . BREAST SURGERY     2000  LEFT BREAST  LUMP REMOVED  . cataract surgeries  Bilateral   . colonoscopy    . COLONOSCOPY  03/25/2011   Procedure: COLONOSCOPY;  Surgeon: Rogene Houston, MD;  Location: [facility];  Service: Endoscopy;  Laterality: N/A;  9:30 am  . CYSTOSCOPY W/ URETERAL STENT PLACEMENT Left 02/27/2018   Procedure: CYSTOSCOPY WITH LEFT RETROGRADE PYELOGRAM AND lEFT URETEROSCOPY WITH BIOPSY AND  LEFT URETERAL STENT PLACEMENT;  Surgeon: Cleon Gustin, MD;  Location: [facility];  Service: Urology;  Laterality: Left;  . left inguinal hernia repair    . multiple toe surgeries    . ROBOT ASSITED LAPAROSCOPIC NEPHROURETERECTOMY Left 05/11/2018   Procedure: XI ROBOT ASSITED LAPAROSCOPIC LEFT DISTAL URETERECTOMY, URETERAL  REIMPLANTATION, WITH PSOAS HITCH AND BOARI FLAP. PELVIC LYMPH NODE DISSCETION AND LEFT URETERAL STENT PLACEMENT;  Surgeon: Cleon Gustin, MD;  Location: [facility];  Service: Urology;  Laterality: Left;  . TONSILLECTOMY      Prior to Admission medications   Medication Sig Start Date End Date Taking? Authorizing Provider  amLODipine (NORVASC) 5 MG tablet TAKE ONE TABLET BY MOUTH ONCE DAILY. Patient taking differently: Take 5 mg by mouth daily.  11/14/18  Yes Kathyrn Drown, MD  anastrozole (ARIMIDEX) 1 MG tablet Take 1 tablet (1 mg total) by mouth daily. Patient taking differently: Take 1 mg by mouth every evening.  11/04/17  Yes Derek Jack, MD  docusate sodium (COLACE) 100 MG capsule Take 200 mg by mouth every evening.    Yes [provider]  fluticasone (FLONASE) 50 MCG/ACT nasal spray Place 2 sprays into both nostrils daily as needed for allergies.    Yes [provider]  furosemide (LASIX) 20 MG tablet TAKE ONE TABLET BY MOUTH EVERY MORNING. Patient taking differently: Take 20 mg by mouth every morning.  11/14/18  Yes Luking, Elayne Snare, MD  levocetirizine (XYZAL) 5 MG tablet Take 5 mg by mouth daily as needed for allergies.  10/27/18  Yes [provider]  nortriptyline (PAMELOR) 10 MG capsule TAKE 2 TO 3 CAPSULES BY MOUTH NIGHTLY. Patient taking differently: Take 20-30 mg by mouth at bedtime.  11/14/18  Yes Luking, Elayne Snare, MD  OXYGEN Inhale 2 L into the lungs. As needed for activities requiring exertion, per patient uses very sparingly   Yes [provider]  polyethylene glycol (MIRALAX / GLYCOLAX) packet Take 17 g by mouth daily  as needed for moderate constipation.    Yes [provider]  PROAIR RESPICLICK 500 (90 Base) MCG/ACT AEPB INHALE 1 PUFF INTO THE LUNGS EVERY 4 HOURS AS NEEDED. Patient taking differently: Inhale 1 puff into the lungs every 4 (four) hours as needed (shortness of breath or wheezing).  03/24/18  Yes McQuaid, Ronie Spies, MD  STIOLTO RESPIMAT 2.5-2.5 MCG/ACT AERS INHALE 2 PUFFS INTO THE LUNGS ONCE DAILY. Patient taking differently: Inhale 2 puffs into the lungs daily as needed (for shortness of breath).  12/22/17  Yes Juanito Doom, MD    Current Facility-Administered Medications  Medication Dose Route Frequency Provider Last Rate Last Dose  . 0.9 %  sodium chloride infusion  10 mL/hr Intravenous Once Emokpae, Ejiroghene E, MD      . albuterol (PROVENTIL) (2.5 MG/3ML) 0.083%  nebulizer solution 3 mL  3 mL Inhalation Q4H PRN Emokpae, Ejiroghene E, MD      . amLODipine (NORVASC) tablet 5 mg  5 mg Oral Daily Emokpae, Ejiroghene E, MD   5 mg at 12/05/18 1006  . anastrozole (ARIMIDEX) tablet 1 mg  1 mg Oral QPM Emokpae, Ejiroghene E, MD   1 mg at 12/05/18 0032  . arformoterol (BROVANA) nebulizer solution 15 mcg  15 mcg Nebulization BID Emokpae, Ejiroghene E, MD   15 mcg at 12/05/18 0800  . furosemide (LASIX) injection 20 mg  20 mg Intravenous Once Emokpae, Ejiroghene E, MD      . furosemide (LASIX) tablet 20 mg  20 mg Oral q morning - 10a Emokpae, Ejiroghene E, MD   20 mg at 12/05/18 1006  . nortriptyline (PAMELOR) capsule 20-30 mg  20-30 mg Oral QHS Emokpae, Ejiroghene E, MD   30 mg at 12/05/18 0032  . ondansetron (ZOFRAN) tablet 4 mg  4 mg Oral Q6H PRN Emokpae, Ejiroghene E, MD       Or  . ondansetron (ZOFRAN) injection 4 mg  4 mg Intravenous Q6H PRN Emokpae, Ejiroghene E, MD      . polyethylene glycol (MIRALAX / GLYCOLAX) packet 17 g  17 g Oral Once ,  U, MD      . polyethylene glycol (MIRALAX / GLYCOLAX) packet 17 g  17 g Oral Once ,  U, MD      . umeclidinium  bromide (INCRUSE ELLIPTA) 62.5 MCG/INH 1 puff  1 puff Inhalation Daily Emokpae, Ejiroghene E, MD   1 puff at 12/05/18 0926    Allergies as of 12/04/2018 - Review Complete 12/04/2018  Allergen Reaction Noted  . Augmentin [amoxicillin-pot clavulanate] Nausea And Vomiting 10/07/2015  . Amoxicillin Diarrhea 10/16/2015  . Codeine Nausea And Vomiting 06/07/2007  . Morphine Nausea And Vomiting   . Neomycin-bacitracin zn-polymyx Itching and Rash     Family History  Problem Relation Age of Onset  . Colon cancer Father     Social History   Socioeconomic History  . Marital status: Widowed    Spouse name: Not on file  . Number of children: Not on file  . Years of education: Not on file  . Highest education level: Not on file  Occupational History  . Not on file  Social Needs  . Financial resource strain: Not on file  . Food insecurity    Worry: Not on file    Inability: Not on file  . Transportation needs    Medical: Not on file    Non-medical: Not on file  Tobacco Use  . Smoking status: Former Smoker    Packs/day: 2.00    Years: 50.00    Pack years: 100.00    Types: Cigarettes    Quit date: 03/15/1988    Years since quitting: 30.7  . Smokeless tobacco: Never Used  Substance and Sexual Activity  . Alcohol use: Yes    Alcohol/week: 0.0 standard drinks    Comment: seldom   . Drug use: No  . Sexual activity: Not on file  Lifestyle  . Physical activity    Days per week: Not on file    Minutes per session: Not on file  . Stress: Not on file  Relationships  . Social Herbalist on phone: Not on file    Gets together: Not on file    Attends religious service: Not on file    Active member of club or organization: Not  on file    Attends meetings of clubs or organizations: Not on file    Relationship status: Not on file  . Intimate partner violence    Fear of current or ex partner: Not on file    Emotionally abused: Not on file    Physically abused: Not on file     Forced sexual activity: Not on file  Other Topics Concern  . Not on file  Social History Narrative  . Not on file    Review of Systems: See HPI, otherwise normal ROS  Physical Exam: Temp:  [97.9 F (36.6 C)-99.4 F (37.4 C)] 98.9 F (37.2 C) (09/22 0552) Pulse Rate:  [87-98] 89 (09/22 0552) Resp:  [12-23] 16 (09/22 0552) BP: (118-154)/(53-72) 118/58 (09/22 0552) SpO2:  [97 %-100 %] 97 % (09/22 0800) Weight:  [62.1 kg] 62.1 kg (09/21 1437) Last BM Date: 12/03/18   Patient is alert and in no acute distress. She appears younger than stated age. Conjunctivae is pale.  Sclerae nonicteric. Oropharyngeal mucosa is normal. Neck without masses lymphadenopathy or thyromegaly. Cardiac exam with regular rhythm normal S1 and S2.  No murmur or gallop noted. Auscultation lungs reveal vesicular breath sounds bilaterally. Abdomen is full.  Bowel sounds are normal.  She has small lower midline scar along with laparoscopy scars.  On palpation abdomen is soft.  She has mild tenderness across lower abdomen.  No guarding or rebound.  No masses or organomegaly. No peripheral edema clubbing or koilonychia noted.  Lab Results: Recent Labs    12/04/18 1205 12/04/18 1406 12/05/18 0635  WBC 17.3* 18.1* 15.5*  HGB 5.5* 5.3* 7.7*  HCT 19.3* 19.0* 24.8*  PLT 510* 493* 412*   BMET Recent Labs    12/04/18 1205 12/04/18 1406  NA 138 135  K 4.8 4.8  CL 106 104  CO2 23 22  GLUCOSE 131* 124*  BUN 34* 33*  CREATININE 1.95* 1.93*  CALCIUM 8.3* 8.1*   LFT Recent Labs    12/04/18 1205  PROT 6.7  ALBUMIN 3.1*  AST 17  ALT 12  ALKPHOS 79  BILITOT 0.2*  BILIDIR <0.1  IBILI NOT CALCULATED   Serum iron 26, TIBC 262 and saturation 10%. Serum ferritin 329. Serum B12 435. Serum folate 38.7.    Studies/Results: Ct Abdomen Pelvis Wo Contrast  Result Date: 12/04/2018 CLINICAL DATA:  85 year old female with right lower quadrant pain and tenderness. History of prior urothelial carcinoma on  the left, right breast cancer. Status post left robotic assisted nephro ureterectomy. EXAM: CT ABDOMEN AND PELVIS WITHOUT CONTRAST TECHNIQUE: Multidetector CT imaging of the abdomen and pelvis was performed following the standard protocol without IV contrast. COMPARISON:  Prior CT abdomen/pelvis 02/01/2018 FINDINGS: Lower chest: Essentially stable nonspecific ground-glass attenuation airspace opacity in the posterior right lower lobe presently measures 3.5 x 1.6 cm compared to 3.1 x 1.6 cm in 2019. this lesion was previously evaluated on PET dated April 03, 2018 and was not hypermetabolic. The lung bases are otherwise clear. The intracardiac blood pool is hypodense relative to the adjacent myocardium consistent with anemia. No pericardial effusion. Unremarkable distal thoracic esophagus. Hepatobiliary: Stable 2.5 cm low-attenuation lesion with associated dystrophic calcification arising from the posterior aspect of hepatic segment 7. Numerous additional circumscribed low-attenuation lesions are present scattered throughout the liver. No significant interval change in the size, number or configuration of these lesions. Although incompletely evaluated in the absence of intravenous contrast material, the stability is highly suggestive of a benign process. Gallbladder is unremarkable. No  intra or extrahepatic biliary ductal dilatation. Pancreas: Unremarkable. No pancreatic ductal dilatation or surrounding inflammatory changes. Spleen: Normal in size without focal abnormality. Adrenals/Urinary Tract: No evidence of hydronephrosis. Stable low-attenuation lesion in the upper pole of the right kidney. No ureterectasis. The bladder is unremarkable. Stomach/Bowel: Unremarkable appearance of the stomach and duodenum. No evidence of bowel obstruction. There is a large colonic stool burden suggesting underlying constipation. Marked masslike thickening present in the cecum. The cecal margin is slightly irregular and shaggy with  what appears to be hazy infiltration of the adjacent pericolonic fat. At least 1 adjacent ileocecal lymph node is enlarged at 1 cm. Nearby right common iliac chain lymph nodes are also concerning although not technically enlarged by imaging criteria. Vascular/Lymphatic: Limited evaluation in the absence of intravenous contrast. Atherosclerotic vascular calcifications throughout the abdominal aorta. No aneurysm. Reproductive: Surgical changes of prior hysterectomy. Other: Small fat containing midline ventral hernia.  No ascites. Musculoskeletal: No acute fracture or aggressive appearing lytic or blastic osseous lesion. Multilevel degenerative disc disease. IMPRESSION: 1. Masslike thickening of the cecum with slightly ill-defined border and ileocecal and right common iliac lymphadenopathy. Findings are highly concerning for colon adenocarcinoma with nodal metastatic disease. 2. Multifocal circumscribed low-attenuation liver lesions demonstrate no significant interval change in size, number or configuration compared to prior imaging and are favored to be benign. No definite new lesion identified. 3. The intracardiac blood pool is hypodense relative to the adjacent myocardium consistent with anemia. 4. Additional ancillary findings as above without significant interval change. Electronically Signed   By: Jacqulynn Cadet M.D.   On: 12/04/2018 16:09   Current study reviewed and compared with study from 02/01/2018. The studies were reviewed side-by-side along with Dr. Thornton Papas.  CT from November 2019 does not reveal any abnormality involving cecal wall.  There appears to be involvement of small bowel. Hepatic lesions appear to be cysts and unchanged since prior study of November 2019.  Also reviewed PET scan from 04/03/2018 and it reveals no abnormal activity or uptake in small or large bowel.  Assessment;  Patient is 85 year old Caucasian female who presents with acute onset of right lower quadrant abdominal pain  and profound anemia with hemoglobin of 5.5 g.  Her stool is guaiac negative.  Abdominal pelvic CT reviews cecal mass which seem to involve small bowel.  Patient has history of colonic adenoma and family history of CRC in father and first cousin.  Patient's last colonoscopy was in January 2013 with removal of 3 small polyps. Patient has received 2 units of PRBCs and hemoglobin is up to 7.7 g. Past history significant for breast carcinoma diagnosed about 3 years ago and left ureteral transitional cell carcinoma for which she had robotic surgery in February 2020. Given her personal and family history I am concerned that we are dealing with colonic primary.  However metastatic disease from ureteral primary remains in differential diagnosis.  It is interesting to mention that PET scan in January this year was unremarkable and so was abdominal pelvic CT in November 2019. Patient is agreeable to proceed with diagnostic colonoscopy.  Hepatic lesions appear to be cysts.  Regarding patient's anemia her MCV has been normal or elevated.  Serum haptoglobin in February 2020 was normal ruling out hemolysis.  Her bilirubin is normal.  Therefore I doubt that she has significant hemolysis.  Therefore her anemia does not appear to be solely on the basis of iron deficiency.  Recommendations;  Check CEA and LDH with next blood work. Diagnostic colonoscopy  on 12/06/2018. Patient will be prepped with [facility] tomorrow morning and undergo colonoscopy later in the day.   LOS: 0 days      12/05/2018, 1:56 PM

## 2018-12-05 NOTE — Progress Notes (Signed)
PROGRESS NOTE    [name]  LJQ:[id number] DOB: 04/18/1928 DOA: 12/04/2018 PCP: Kathyrn Drown, MD    Brief Narrative:  85 year old female with a history of previous breast cancer, urothelial cancer, hypertension, COPD, presented to the hospital with right lower abdominal pain and was found to be significantly anemic with a hemoglobin of 5.5.  CT scan of the abdomen pelvis indicated mass in the cecum.  She was transfused PRBCs and admitted for further work-up.   Assessment & Plan:   Active Problems:   Essential hypertension, benign   Breast cancer, right breast (HCC)   COPD (chronic obstructive pulmonary disease) (HCC)   Acute anemia   Cecum mass   CKD (chronic kidney disease), stage IV ([place])   1. Acute anemia, likely related to iron deficiency as well as anemia of chronic disease.  Patient was transfused 2 units PRBC on admission for hemoglobin of 5.3.  Subsequent hemoglobin improved to 7.7.  Continue to follow. 2. Cecal mass.  Noted on CT imaging with possible lymph nodes.  Gastroenterology consulted and will perform colonoscopy on 9/23.  Will request general surgery input. 3. COPD.  Currently stable.  No signs of wheezing.  Continue bronchodilators 4. Chronic kidney disease stage IV.  Creatinine is currently at baseline.  Continue to monitor. 5. Hypertension.  Stable on amlodipine. 6. Breast cancer, right breast.  She reports having surgery done in the past and has also had radiation.  Continue on anastrozole 7. Left ureteral transitional cell carcinoma, status post robotic surgery in 04/2018.   DVT prophylaxis: SCDs Code Status: Full code Family Communication: Discussed with patient Disposition Plan: Discharge home once work-up is complete   Consultants:   Gastroenterology  General surgery  Procedures:     Antimicrobials:       Subjective: Patient is feeling better after transfusion of PRBC.  Overall weakness is better.  No abdominal  pain.  Objective: Vitals:   12/05/18 0800 12/05/18 1420 12/05/18 1904 12/05/18 2059  BP:  132/60  (!) 131/57  Pulse:  89  85  Resp:  17  16  Temp:  98.1 F (36.7 C)  99.1 F (37.3 C)  TempSrc:    Oral  SpO2: 97% 100% 99% 100%  Weight:      Height:        Intake/Output Summary (Last 24 hours) at 12/05/2018 2137 Last data filed at 12/05/2018 1811 Gross per 24 hour  Intake 805 ml  Output 900 ml  Net -95 ml   Filed Weights   12/04/18 1437  Weight: 62.1 kg    Examination:  General exam: Appears calm and comfortable  Respiratory system: Clear to auscultation. Respiratory effort normal. Cardiovascular system: S1 & S2 heard, RRR. No JVD, murmurs, rubs, gallops or clicks. No pedal edema. Gastrointestinal system: Abdomen is nondistended, soft and nontender. No organomegaly or masses felt. Normal bowel sounds heard. Central nervous system: Alert and oriented. No focal neurological deficits. Extremities: Symmetric 5 x 5 power. Skin: No rashes, lesions or ulcers Psychiatry: Judgement and insight appear normal. Mood & affect appropriate.     Data Reviewed: I have personally reviewed following labs and imaging studies  CBC: Recent Labs  Lab 12/04/18 1205 12/04/18 1406 12/05/18 0635  WBC 17.3* 18.1* 15.5*  NEUTROABS 15.0*  --   --   HGB 5.5* 5.3* 7.7*  HCT 19.3* 19.0* 24.8*  MCV 98.0 99.5 92.9  PLT 510* 493* 219*   Basic Metabolic Panel: Recent Labs  Lab 12/04/18 1205 12/04/18 1406  NA 138 135  K 4.8 4.8  CL 106 104  CO2 23 22  GLUCOSE 131* 124*  BUN 34* 33*  CREATININE 1.95* 1.93*  CALCIUM 8.3* 8.1*   GFR: Estimated Creatinine Clearance: 17.8 mL/min (A) (by C-G formula based on SCr of 1.93 mg/dL (H)). Liver Function Tests: Recent Labs  Lab 12/04/18 1205  AST 17  ALT 12  ALKPHOS 79  BILITOT 0.2*  PROT 6.7  ALBUMIN 3.1*   Recent Labs  Lab 12/04/18 1205  LIPASE 32   No results for input(s): AMMONIA in the last 168 hours. Coagulation Profile: No  results for input(s): INR, PROTIME in the last 168 hours. Cardiac Enzymes: No results for input(s): CKTOTAL, CKMB, CKMBINDEX, TROPONINI in the last 168 hours. BNP (last 3 results) No results for input(s): PROBNP in the last 8760 hours. HbA1C: No results for input(s): HGBA1C in the last 72 hours. CBG: Recent Labs  Lab 12/05/18 0636  GLUCAP 109*   Lipid Profile: No results for input(s): CHOL, HDL, LDLCALC, TRIG, CHOLHDL, LDLDIRECT in the last 72 hours. Thyroid Function Tests: No results for input(s): TSH, T4TOTAL, FREET4, T3FREE, THYROIDAB in the last 72 hours. Anemia Panel: Recent Labs    12/04/18 1702  VITAMINB12 435  FOLATE 38.7  FERRITIN 329*  TIBC 262  IRON 26*  RETICCTPCT 5.1*   Sepsis Labs: No results for input(s): PROCALCITON, LATICACIDVEN in the last 168 hours.  Recent Results (from the past 240 hour(s))  SARS CORONAVIRUS 2 (TAT 6-24 HRS) Nasopharyngeal Nasopharyngeal Swab     Status: None   Collection Time: 12/04/18  4:39 PM   Specimen: Nasopharyngeal Swab  Result Value Ref Range Status   SARS Coronavirus 2 NEGATIVE NEGATIVE Final    Comment: (NOTE) SARS-CoV-2 target nucleic acids are NOT DETECTED. The SARS-CoV-2 RNA is generally detectable in upper and lower respiratory specimens during the acute phase of infection. Negative results do not preclude SARS-CoV-2 infection, do not rule out co-infections with other pathogens, and should not be used as the sole basis for treatment or other patient management decisions. Negative results must be combined with clinical observations, patient history, and epidemiological information. The expected result is Negative. Fact Sheet for Patients: SugarRoll.be Fact Sheet for Healthcare Providers: https://www.woods-mathews.com/ This test is not yet approved or cleared by the [place] FDA and  has been authorized for detection and/or diagnosis of SARS-CoV-2 by FDA under an  Emergency Use Authorization (EUA). This EUA will remain  in effect (meaning this test can be used) for the duration of the COVID-19 declaration under Section 56 4(b)(1) of the Act, 21 U.S.C. section 360bbb-3(b)(1), unless the authorization is terminated or revoked sooner. Performed at [facility], [place] [address], [place], [place] [address]          Radiology Studies: Ct Abdomen Pelvis Wo Contrast  Result Date: 12/04/2018 CLINICAL DATA:  85 year old female with right lower quadrant pain and tenderness. History of prior urothelial carcinoma on the left, right breast cancer. Status post left robotic assisted nephro ureterectomy. EXAM: CT ABDOMEN AND PELVIS WITHOUT CONTRAST TECHNIQUE: Multidetector CT imaging of the abdomen and pelvis was performed following the standard protocol without IV contrast. COMPARISON:  Prior CT abdomen/pelvis 02/01/2018 FINDINGS: Lower chest: Essentially stable nonspecific ground-glass attenuation airspace opacity in the posterior right lower lobe presently measures 3.5 x 1.6 cm compared to 3.1 x 1.6 cm in 2019. this lesion was previously evaluated on PET dated April 03, 2018 and was not hypermetabolic. The lung bases are otherwise clear.  The intracardiac blood pool is hypodense relative to the adjacent myocardium consistent with anemia. No pericardial effusion. Unremarkable distal thoracic esophagus. Hepatobiliary: Stable 2.5 cm low-attenuation lesion with associated dystrophic calcification arising from the posterior aspect of hepatic segment 7. Numerous additional circumscribed low-attenuation lesions are present scattered throughout the liver. No significant interval change in the size, number or configuration of these lesions. Although incompletely evaluated in the absence of intravenous contrast material, the stability is highly suggestive of a benign process. Gallbladder is unremarkable. No intra or extrahepatic biliary ductal dilatation. Pancreas:  Unremarkable. No pancreatic ductal dilatation or surrounding inflammatory changes. Spleen: Normal in size without focal abnormality. Adrenals/Urinary Tract: No evidence of hydronephrosis. Stable low-attenuation lesion in the upper pole of the right kidney. No ureterectasis. The bladder is unremarkable. Stomach/Bowel: Unremarkable appearance of the stomach and duodenum. No evidence of bowel obstruction. There is a large colonic stool burden suggesting underlying constipation. Marked masslike thickening present in the cecum. The cecal margin is slightly irregular and shaggy with what appears to be hazy infiltration of the adjacent pericolonic fat. At least 1 adjacent ileocecal lymph node is enlarged at 1 cm. Nearby right common iliac chain lymph nodes are also concerning although not technically enlarged by imaging criteria. Vascular/Lymphatic: Limited evaluation in the absence of intravenous contrast. Atherosclerotic vascular calcifications throughout the abdominal aorta. No aneurysm. Reproductive: Surgical changes of prior hysterectomy. Other: Small fat containing midline ventral hernia.  No ascites. Musculoskeletal: No acute fracture or aggressive appearing lytic or blastic osseous lesion. Multilevel degenerative disc disease. IMPRESSION: 1. Masslike thickening of the cecum with slightly ill-defined border and ileocecal and right common iliac lymphadenopathy. Findings are highly concerning for colon adenocarcinoma with nodal metastatic disease. 2. Multifocal circumscribed low-attenuation liver lesions demonstrate no significant interval change in size, number or configuration compared to prior imaging and are favored to be benign. No definite new lesion identified. 3. The intracardiac blood pool is hypodense relative to the adjacent myocardium consistent with anemia. 4. Additional ancillary findings as above without significant interval change. Electronically Signed   By: Jacqulynn Cadet M.D.   On: 12/04/2018  16:09        Scheduled Meds:  amLODipine  5 mg Oral Daily   anastrozole  1 mg Oral QPM   arformoterol  15 mcg Nebulization BID   furosemide  20 mg Intravenous Once   furosemide  20 mg Oral q morning - 10a   nortriptyline  20-30 mg Oral QHS   polyethylene glycol  17 g Oral Once   [START ON 12/06/2018] polyethylene glycol-electrolytes  4,000 mL Oral Once   umeclidinium bromide  1 puff Inhalation Daily   Continuous Infusions:  sodium chloride     sodium chloride       LOS: 0 days    Time spent: 35 minutes    Kathie Dike, MD [facility]   If 7PM-7AM, please contact night-coverage [email or web address]  12/05/2018, 9:37 PM \

## 2018-12-05 NOTE — Care Management Obs Status (Signed)
[facility] NOTIFICATION   Patient Details  Name: [name] MRN: [id number] Date of Birth: Nov 06, 1928   Medicare Observation Status Notification Given:  Yes    Trish Mage, LCSW 12/05/2018, 2:54 PM

## 2018-12-05 NOTE — Progress Notes (Signed)
Patient refused IV lasix, notified mid-level K. Schorr to make aware. States to keep order and give if pt gets SOB during night, if not ok to refuse as pt will get po in a.m.

## 2018-12-06 ENCOUNTER — Encounter (HOSPITAL_COMMUNITY): Payer: Self-pay | Admitting: *Deleted

## 2018-12-06 ENCOUNTER — Encounter (HOSPITAL_COMMUNITY)
Admission: EM | Disposition: A | Discharge status: Skilled Nursing Facility | Payer: Self-pay | Source: Ambulatory Visit | Attending: Internal Medicine

## 2018-12-06 DIAGNOSIS — C180 Malignant neoplasm of cecum: Secondary | ICD-10-CM

## 2018-12-06 DIAGNOSIS — D123 Benign neoplasm of transverse colon: Secondary | ICD-10-CM

## 2018-12-06 DIAGNOSIS — C182 Malignant neoplasm of ascending colon: Secondary | ICD-10-CM

## 2018-12-06 DIAGNOSIS — K6289 Other specified diseases of anus and rectum: Secondary | ICD-10-CM

## 2018-12-06 DIAGNOSIS — R933 Abnormal findings on diagnostic imaging of other parts of digestive tract: Secondary | ICD-10-CM

## 2018-12-06 HISTORY — PX: BIOPSY: SHX5522

## 2018-12-06 HISTORY — PX: COLONOSCOPY: SHX5424

## 2018-12-06 LAB — CBC
HCT: 26.6 % — ABNORMAL LOW (ref 36.0–46.0)
Hemoglobin: 8.2 g/dL — ABNORMAL LOW (ref 12.0–15.0)
MCH: 28.7 pg (ref 26.0–34.0)
MCHC: 30.8 g/dL (ref 30.0–36.0)
MCV: 93 fL (ref 80.0–100.0)
Platelets: 386 10*3/uL (ref 150–400)
RBC: 2.86 MIL/uL — ABNORMAL LOW (ref 3.87–5.11)
RDW: 16.1 % — ABNORMAL HIGH (ref 11.5–15.5)
WBC: 13.5 10*3/uL — ABNORMAL HIGH (ref 4.0–10.5)
nRBC: 0 % (ref 0.0–0.2)

## 2018-12-06 LAB — BASIC METABOLIC PANEL
Anion gap: 10 (ref 5–15)
BUN: 26 mg/dL — ABNORMAL HIGH (ref 8–23)
CO2: 20 mmol/L — ABNORMAL LOW (ref 22–32)
Calcium: 7.1 mg/dL — ABNORMAL LOW (ref 8.9–10.3)
Chloride: 107 mmol/L (ref 98–111)
Creatinine, Ser: 1.87 mg/dL — ABNORMAL HIGH (ref 0.44–1.00)
GFR calc Af Amer: 27 mL/min — ABNORMAL LOW (ref >60)
GFR calc non Af Amer: 24 mL/min — ABNORMAL LOW (ref >60)
Glucose, Bld: 103 mg/dL — ABNORMAL HIGH (ref 70–99)
Potassium: 3.8 mmol/L (ref 3.5–5.1)
Sodium: 137 mmol/L (ref 135–145)

## 2018-12-06 LAB — GLUCOSE, CAPILLARY: Glucose-Capillary: 110 mg/dL — ABNORMAL HIGH (ref 70–99)

## 2018-12-06 SURGERY — COLONOSCOPY
Anesthesia: Moderate Sedation

## 2018-12-06 MED ORDER — MIDAZOLAM HCL 5 MG/5ML IJ SOLN
INTRAMUSCULAR | Status: AC
  Filled 2018-12-06: qty 10

## 2018-12-06 MED ORDER — MEPERIDINE HCL 50 MG/ML IJ SOLN
INTRAMUSCULAR | Status: AC
  Filled 2018-12-06: qty 1

## 2018-12-06 MED ORDER — STERILE WATER FOR IRRIGATION IR SOLN
Status: DC | PRN
  Administered 2018-12-06: 2.5 mL

## 2018-12-06 MED ORDER — MEPERIDINE HCL 50 MG/ML IJ SOLN
INTRAMUSCULAR | Status: DC | PRN
  Administered 2018-12-06: 15 mg via INTRAVENOUS
  Administered 2018-12-06: 25 mg via INTRAVENOUS

## 2018-12-06 MED ORDER — MIDAZOLAM HCL 5 MG/5ML IJ SOLN
INTRAMUSCULAR | Status: DC | PRN
  Administered 2018-12-06 (×3): 1 mg via INTRAVENOUS

## 2018-12-06 NOTE — Progress Notes (Signed)
PROGRESS NOTE    [name]  EXH:[id number] DOB: 06/11/1928 DOA: 12/04/2018 PCP: Kathyrn Drown, MD    Brief Narrative:  85 year old female with a history of previous breast cancer, urothelial cancer, hypertension, COPD, presented to the hospital with right lower abdominal pain and was found to be significantly anemic with a hemoglobin of 5.5.  CT scan of the abdomen pelvis indicated mass in the cecum.  She was transfused PRBCs and admitted for further work-up.   Assessment & Plan:   Principal Problem:   Cecum mass/Large ulcerated mass at ascending colon and cecum.--Presumed malignant Active Problems:   Essential hypertension, benign   Breast cancer, right breast (HCC)   COPD (chronic obstructive pulmonary disease) (HCC)   Acute anemia   CKD (chronic kidney disease), stage IV ([place])   1. Acute anemia, likely related to iron deficiency in the setting of colon malignancy superimposed on baseline  anemia of chronic disease.  Patient was transfused 2 units PRBC on admission for hemoglobin of 5.3.  Subsequent hemoglobin improved to 8.3.  Continue to follow.   2-Large ulcerated mass at ascending colon and cecum.--Presumed malignant, status post colonoscopy 12/06/2018, biopsy/pathology pending.  Noted on CT imaging with possible lymph nodes.  Gastroenterology consulted and will perform colonoscopy on 9/23.  -Surgical consultation with DR. Haywood Ingram--plan to transfer to [facility] on 12/07/2018.  CEA pending  3)COPD.  Currently stable.  No signs of wheezing.  Continue bronchodilators  4)Chronic kidney disease stage IV.  Creatinine is currently at baseline around 1.9, continue to monitor.  5)Hypertension.  Stable on amlodipine.  6)Breast cancer, right breast.  She reports having surgery done in the past and has also had radiation.  Continue on anastrozole  7)Left ureteral transitional cell carcinoma, status post robotic surgery in 04/2018.   DVT prophylaxis: SCDs Code Status: Full  code Family Communication: Discussed with patient Disposition Plan: -Surgical consultation with DR. Haywood Ingram--plan to transfer to [facility] on 12/07/2018  Consultants:   Gastroenterology  General surgery  Procedures:    12/06/2018--colonoscopy -Large ulcerated mass at ascending colon and cecum. Multiple biopsies taken. Few small polyps distal to this lesion not removed. Antimicrobials:    Subjective: - Tolerated colonoscopy well, -No chest pains or shortness of breath  Objective: Vitals:   12/06/18 1520 12/06/18 1525 12/06/18 1530 12/06/18 1535  BP: 107/78 (!) 114/46    Pulse: 89 88 89 90  Resp: 19 19 17  (!) 23  Temp:      TempSrc:      SpO2: 100% 100% 100% 100%  Weight:      Height:        Intake/Output Summary (Last 24 hours) at 12/06/2018 1617 Last data filed at 12/06/2018 1500 Gross per 24 hour  Intake 565 ml  Output -  Net 565 ml   Filed Weights   12/04/18 1437 12/06/18 1326  Weight: 62.1 kg 62.1 kg    Examination:  General exam: Appears calm and comfortable  Respiratory system: Clear to auscultation. Respiratory effort normal. Cardiovascular system: S1 & S2 heard, RRR. No JVD, murmurs, rubs, gallops or clicks. No pedal edema. Gastrointestinal system: Abdomen is nondistended, soft and nontender. No organomegaly or masses felt. Normal bowel sounds heard. Central nervous system: Alert and oriented. No focal neurological deficits. Extremities: Symmetric 5 x 5 power. Skin: No rashes, lesions or ulcers Psychiatry: Judgement and insight appear normal. Mood & affect appropriate.   Data Reviewed:    CBC: Recent Labs  Lab 12/04/18 1205 12/04/18 1406 12/05/18 3810  12/06/18 0608  WBC 17.3* 18.1* 15.5* 13.5*  NEUTROABS 15.0*  --   --   --   HGB 5.5* 5.3* 7.7* 8.2*  HCT 19.3* 19.0* 24.8* 26.6*  MCV 98.0 99.5 92.9 93.0  PLT 510* 493* 412* 254   Basic Metabolic Panel: Recent Labs  Lab 12/04/18 1205 12/04/18 1406 12/06/18 0608  NA 138 135 137  K 4.8  4.8 3.8  CL 106 104 107  CO2 23 22 20*  GLUCOSE 131* 124* 103*  BUN 34* 33* 26*  CREATININE 1.95* 1.93* 1.87*  CALCIUM 8.3* 8.1* 7.1*   GFR: Estimated Creatinine Clearance: 18.3 mL/min (A) (by C-G formula based on SCr of 1.87 mg/dL (H)). Liver Function Tests: Recent Labs  Lab 12/04/18 1205  AST 17  ALT 12  ALKPHOS 79  BILITOT 0.2*  PROT 6.7  ALBUMIN 3.1*   Recent Labs  Lab 12/04/18 1205  LIPASE 32   No results for input(s): AMMONIA in the last 168 hours. Coagulation Profile: No results for input(s): INR, PROTIME in the last 168 hours. Cardiac Enzymes: No results for input(s): CKTOTAL, CKMB, CKMBINDEX, TROPONINI in the last 168 hours. BNP (last 3 results) No results for input(s): PROBNP in the last 8760 hours. HbA1C: No results for input(s): HGBA1C in the last 72 hours. CBG: Recent Labs  Lab 12/05/18 0636 12/06/18 0557  GLUCAP 109* 110*   Lipid Profile: No results for input(s): CHOL, HDL, LDLCALC, TRIG, CHOLHDL, LDLDIRECT in the last 72 hours. Thyroid Function Tests: No results for input(s): TSH, T4TOTAL, FREET4, T3FREE, THYROIDAB in the last 72 hours. Anemia Panel: Recent Labs    12/04/18 1702  VITAMINB12 435  FOLATE 38.7  FERRITIN 329*  TIBC 262  IRON 26*  RETICCTPCT 5.1*   Sepsis Labs: No results for input(s): PROCALCITON, LATICACIDVEN in the last 168 hours.  Recent Results (from the past 240 hour(s))  SARS CORONAVIRUS 2 (TAT 6-24 HRS) Nasopharyngeal Nasopharyngeal Swab     Status: None   Collection Time: 12/04/18  4:39 PM   Specimen: Nasopharyngeal Swab  Result Value Ref Range Status   SARS Coronavirus 2 NEGATIVE NEGATIVE Final    Comment: (NOTE) SARS-CoV-2 target nucleic acids are NOT DETECTED. The SARS-CoV-2 RNA is generally detectable in upper and lower respiratory specimens during the acute phase of infection. Negative results do not preclude SARS-CoV-2 infection, do not rule out co-infections with other pathogens, and should not be used  as the sole basis for treatment or other patient management decisions. Negative results must be combined with clinical observations, patient history, and epidemiological information. The expected result is Negative. Fact Sheet for Patients: SugarRoll.be Fact Sheet for Healthcare Providers: https://www.woods-mathews.com/ This test is not yet approved or cleared by the [place] FDA and  has been authorized for detection and/or diagnosis of SARS-CoV-2 by FDA under an Emergency Use Authorization (EUA). This EUA will remain  in effect (meaning this test can be used) for the duration of the COVID-19 declaration under Section 56 4(b)(1) of the Act, 21 U.S.C. section 360bbb-3(b)(1), unless the authorization is terminated or revoked sooner. Performed at [facility], [place] [address] [place], [place] [address]    Radiology Studies: No results found.  Scheduled Meds: . amLODipine  5 mg Oral Daily  . anastrozole  1 mg Oral QPM  . arformoterol  15 mcg Nebulization BID  . furosemide  20 mg Intravenous Once  . furosemide  20 mg Oral q morning - 10a  . meperidine      .  midazolam      . nortriptyline  20-30 mg Oral QHS  . umeclidinium bromide  1 puff Inhalation Daily   Continuous Infusions: . sodium chloride      LOS: 1 day   Roxan Hockey, MD [facility] If 7PM-7AM, please contact night-coverage [email or web address]  12/06/2018, 4:17 PM \

## 2018-12-06 NOTE — Op Note (Signed)
[facility] Patient Name: [name] Procedure Date: 12/06/2018 2:14 PM MRN: [id number] Date of Birth: 1928-06-01 Attending MD: Hildred Laser , MD CSN: [id number] Age: 85 Admit Type: Inpatient Procedure:                Colonoscopy Indications:              Iron deficiency anemia, Abnormal CT of the GI tract Providers:                Hildred Laser, MD, [place]is [place] B. Sharon Seller, RN, Raphael Gibney, Technician Referring MD:             Kathie Dike, MD Medicines:                Meperidine 40 mg IV, Midazolam 3 mg IV Complications:            No immediate complications. Estimated Blood Loss:     Estimated blood loss was minimal. Procedure:                Pre-Anesthesia Assessment:                           - Prior to the procedure, a History and Physical                            was performed, and patient medications and                            allergies were reviewed. The patient's tolerance of                            previous anesthesia was also reviewed. The risks                            and benefits of the procedure and the sedation                            options and risks were discussed with the patient.                            All questions were answered, and informed consent                            was obtained. Prior Anticoagulants: The patient has                            taken no previous anticoagulant or antiplatelet                            agents. ASA Grade Assessment: III - A patient with                            severe systemic disease. After reviewing the risks  and benefits, the patient was deemed in                            satisfactory condition to undergo the procedure.                           After obtaining informed consent, the colonoscope                            was passed under direct vision. Throughout the                            procedure, the patient's blood  pressure, pulse, and                            oxygen saturations were monitored continuously. The                            [id number]-[id number] ([id number]) scope was introduced through                            the anus and advanced to the the cecum. The                            colonoscopy was performed without difficulty. The                            patient tolerated the procedure well. The quality                            of the bowel preparation was adequate. The rectum                            was photographed. Scope In: 2:51:42 PM Scope Out: 3:07:28 PM Scope Withdrawal Time: 0 hours 7 minutes 31 seconds  Total Procedure Duration: 0 hours 15 minutes 46 seconds  Findings:      The perianal and digital rectal examinations were normal.      A polypoid and ulcerated non-obstructing large mass was found in the       ascending colon and in the cecum. The mass was non-circumferential. The       mass measured six cm in length. No bleeding was present. This was       biopsied with a cold forceps for histology. The pathology specimen was       placed into Bottle Number 1.      Two polyps were found in the hepatic flexure.      The exam was otherwise normal throughout the examined colon.      Anal papilla(e) were hypertrophied. Impression:               - Malignant tumor in the ascending colon and in the                            cecum. Biopsied.                           -  Two polyps at the hepatic flexure.                           - Anal papilla(e) were hypertrophied. Moderate Sedation:      Moderate (conscious) sedation was administered by the endoscopy nurse       and supervised by the endoscopist. The following parameters were       monitored: oxygen saturation, heart rate, blood pressure, CO2       capnography and response to care. Total physician intraservice time was       21 minutes. Recommendation:           - Return patient to hospital ward for ongoing care.                            - Full liquid diet today.                           - Continue present medications.                           - Await pathology results.                           - Surgical consultation with DR. Fanny Skates. I                            have contacted his office.                           Patient will need to be transfered to [facility]. Procedure Code(s):        --- Professional ---                           [phone], Colonoscopy, flexible; with biopsy, single                            or multiple                           G0500, Moderate sedation services provided by the                            same physician or other qualified health care                            professional performing a gastrointestinal                            endoscopic service that sedation supports,                            requiring the presence of an independent trained                            observer to assist in the monitoring of the  patient's level of consciousness and physiological                            status; initial 15 minutes of intra-service time;                            patient age 70 years or older (additional time may                            be reported with [phone], as appropriate) Diagnosis Code(s):        --- Professional ---                           C18.2, Malignant neoplasm of ascending colon                           C18.0, Malignant neoplasm of cecum                           K63.5, Polyp of colon                           K62.89, Other specified diseases of anus and rectum                           D50.9, Iron deficiency anemia, unspecified                           R93.3, Abnormal findings on diagnostic imaging of                            other parts of digestive tract CPT copyright 2019 [employer]. All rights reserved. The codes documented in this report are preliminary and upon coder review may  be revised to  meet current compliance requirements. Hildred Laser, MD Hildred Laser, MD 12/06/2018 3:26:21 PM This report has been signed electronically. Number of Addenda: 0

## 2018-12-06 NOTE — Progress Notes (Signed)
Brief colonoscopy note  Large ulcerated mass at ascending colon and cecum. Multiple biopsies taken. Few small polyps distal to this lesion not removed. Patient tolerated procedure well.

## 2018-12-06 NOTE — Consult Note (Signed)
[facility] Consult  Reason for Consult: Cecal Mass  Referring Physician: Dr. Denton Brick   Chief Complaint    Low Hemoglobin      HPI: [name] is a 85 y.o. female with a history of breast cancer, ureteral transitional cell cancer, COPD who has a recent finding on CT of a cecal mass with concern for lymph node metastasis.  She presented to her PCP after experiencing several days of abdominal pain on the right lower quadrant and some constipation that she reports having since her left robot assisted laparoscopic ureteral neocystotomy and Psoas Hitch an Boari flap with Dr. Alyson Ingles 04/2018 for transitional cell cancer.  She was found to have a Hgb of 5.3, and had known about some chronic anemia thought to be secondary to her kidney disease.  She had multiple hemoccults that were negative per report and denied any melena or BRBPR.  Her CT demonstrated this mass in the cecum, and Dr. Laural Golden took her back for colonoscopy today. Her last colonoscopy was in 2013 with some polyps removed. She has a positive family history of colon cancer with her father and cousin having cancer and colostomies and both of these were in their 67s.    Colonoscopy today with mass that was not obstructing and not circumferential extending into the ascending colon from the cecum. Biopsies were taken.  There was no active signs of bleeding.   Past Medical History:  Diagnosis Date  . Breast cancer ([place])   . Breast cancer, right breast ([place]) 05/01/2015  . Cancer (H[facility])    BREAST CANCER   . Colon polyps   . COPD (chronic obstructive pulmonary disease) ([place])    DR. [place] Bells  . Diabetes mellitus without complication ([place])    PREDIABETIC  CONTROL W/ DIET  . Hypercholesteremia   . Hypertension   . Lung nodules   . Osteoarthritis   . Osteopenia 09/18/2013  . Osteoporosis   . Personal history of radiation therapy   . PONV (postoperative nausea and vomiting)    ' i get so sick with anesthesia"   .  Shortness of breath dyspnea    W/ EXERTION  . Tick bite    3 years ago     Past Surgical History:  Procedure Laterality Date  . ABDOMINAL HYSTERECTOMY    . APPENDECTOMY    . BARTHOLIN GLAND CYST EXCISION    . BREAST EXCISIONAL BIOPSY Left    benign  . BREAST LUMPECTOMY WITH RADIOACTIVE SEED LOCALIZATION Right 05/23/2015   Procedure: RIGHT BREAST LUMPECTOMY WITH RADIOACTIVE SEED LOCALIZATION;  Surgeon: Fanny Skates, MD;  Location: [facility];  Service: General;  Laterality: Right;  . BREAST SURGERY     2000  LEFT BREAST  LUMP REMOVED  . cataract surgeries  Bilateral   . colonoscopy    . COLONOSCOPY  03/25/2011   Procedure: COLONOSCOPY;  Surgeon: Rogene Houston, MD;  [facility];  Service: Endoscopy;  Laterality: N/A;  9:30 am  . CYSTOSCOPY W/ URETERAL STENT PLACEMENT Left 02/27/2018   Procedure: CYSTOSCOPY WITH LEFT RETROGRADE PYELOGRAM AND lEFT URETEROSCOPY WITH BIOPSY AND LEFT URETERAL STENT PLACEMENT;  Surgeon: Cleon Gustin, MD;  Location: [facility];  Service: Urology;  Laterality: Left;  . left inguinal hernia repair    . multiple toe surgeries    . ROBOT ASSITED LAPAROSCOPIC NEPHROURETERECTOMY Left 05/11/2018   Procedure: XI ROBOT ASSITED LAPAROSCOPIC LEFT DISTAL URETERECTOMY, URETERAL  REIMPLANTATION, WITH PSOAS HITCH AND BOARI FLAP. PELVIC LYMPH NODE DISSCETION AND  LEFT URETERAL STENT PLACEMENT;  Surgeon: Cleon Gustin, MD;  Location: [facility];  Service: Urology;  Laterality: Left;  . TONSILLECTOMY      Family History  Problem Relation Age of Onset  . Colon cancer Father     Social History   Tobacco Use  . Smoking status: Former Smoker    Packs/day: 2.00    Years: 50.00    Pack years: 100.00    Types: Cigarettes    Quit date: 03/15/1988    Years since quitting: 30.7  . Smokeless tobacco: Never Used  Substance Use Topics  . Alcohol use: Yes    Alcohol/week: 0.0 standard drinks    Comment: seldom   . Drug use: No    Medications:  I have reviewed  the patient's current medications. Prior to Admission:  Medications Prior to Admission  Medication Sig Dispense Refill Last Dose  . amLODipine (NORVASC) 5 MG tablet TAKE ONE TABLET BY MOUTH ONCE DAILY. (Patient taking differently: Take 5 mg by mouth daily. ) 30 tablet 3 12/04/2018 at Unknown time  . anastrozole (ARIMIDEX) 1 MG tablet Take 1 tablet (1 mg total) by mouth daily. (Patient taking differently: Take 1 mg by mouth every evening. ) 90 tablet 3 12/03/2018 at Unknown time  . docusate sodium (COLACE) 100 MG capsule Take 200 mg by mouth every evening.    12/03/2018 at Unknown time  . fluticasone (FLONASE) 50 MCG/ACT nasal spray Place 2 sprays into both nostrils daily as needed for allergies.    Past Week at Unknown time  . furosemide (LASIX) 20 MG tablet TAKE ONE TABLET BY MOUTH EVERY MORNING. (Patient taking differently: Take 20 mg by mouth every morning. ) 30 tablet 3 12/03/2018 at Unknown time  . levocetirizine (XYZAL) 5 MG tablet Take 5 mg by mouth daily as needed for allergies.    Past Week at Unknown time  . nortriptyline (PAMELOR) 10 MG capsule TAKE 2 TO 3 CAPSULES BY MOUTH NIGHTLY. (Patient taking differently: Take 20-30 mg by mouth at bedtime. ) 90 capsule 3 12/03/2018 at Unknown time  . OXYGEN Inhale 2 L into the lungs. As needed for activities requiring exertion, per patient uses very sparingly   Past Week at Unknown time  . polyethylene glycol (MIRALAX / GLYCOLAX) packet Take 17 g by mouth daily as needed for moderate constipation.    unknown  . PROAIR RESPICLICK 294 (90 Base) MCG/ACT AEPB INHALE 1 PUFF INTO THE LUNGS EVERY 4 HOURS AS NEEDED. (Patient taking differently: Inhale 1 puff into the lungs every 4 (four) hours as needed (shortness of breath or wheezing). ) 1 each 5 Past Week at Unknown time  . STIOLTO RESPIMAT 2.5-2.5 MCG/ACT AERS INHALE 2 PUFFS INTO THE LUNGS ONCE DAILY. (Patient taking differently: Inhale 2 puffs into the lungs daily as needed (for shortness of breath). ) 4 g 5  Past Week at Unknown time   Scheduled: . amLODipine  5 mg Oral Daily  . anastrozole  1 mg Oral QPM  . arformoterol  15 mcg Nebulization BID  . furosemide  20 mg Intravenous Once  . furosemide  20 mg Oral q morning - 10a  . meperidine      . midazolam      . nortriptyline  20-30 mg Oral QHS  . umeclidinium bromide  1 puff Inhalation Daily   Continuous: . sodium chloride 10 mL/hr (12/06/18 1655)   TML:[id number], ondansetron **OR** ondansetron (ZOFRAN) IV  Allergies  Allergen Reactions  . Augmentin [Amoxicillin-Pot Clavulanate]  Nausea And Vomiting    Has patient had a PCN reaction causing immediate rash, facial/tongue/throat swelling, SOB or lightheadedness with hypotension: No Has patient had a PCN reaction causing severe rash involving mucus membranes or skin necrosis: No Has patient had a PCN reaction that required hospitalization: No Has patient had a PCN reaction occurring within the last 10 years: Unknown If all of the above answers are "NO", then may proceed with Cephalosporin use.    Marland Kitchen Amoxicillin Diarrhea    Has patient had a PCN reaction causing immediate rash, facial/tongue/throat swelling, SOB or lightheadedness with hypotension: No Has patient had a PCN reaction causing severe rash involving mucus membranes or skin necrosis: No Has patient had a PCN reaction that required hospitalization: No Has patient had a PCN reaction occurring within the last 10 years: Unknown If all of the above answers are "NO", then may proceed with Cephalosporin use.   . Codeine Nausea And Vomiting  . Morphine Nausea And Vomiting  . Neomycin-Bacitracin Zn-Polymyx Itching and Rash    Unable to use Neosporin     ROS:  A comprehensive review of systems was negative except for: Gastrointestinal: positive for abdominal pain and constipation  Blood pressure (!) 114/46, pulse 90, temperature 97.7 F (36.5 C), temperature source Oral, resp. rate (!) 23, height 5' 2.5" (1.588 m), weight 62.1  kg, SpO2 100 %. Physical Exam Vitals signs reviewed.  Constitutional:      Appearance: Normal appearance.  HENT:     Head: Normocephalic and atraumatic.     Nose: Nose normal.     Mouth/Throat:     Mouth: Mucous membranes are moist.  Eyes:     Extraocular Movements: Extraocular movements intact.     Pupils: Pupils are equal, round, and reactive to light.  Cardiovascular:     Rate and Rhythm: Normal rate.  Pulmonary:     Effort: Pulmonary effort is normal.  Abdominal:     General: There is no distension.     Palpations: Abdomen is soft.     Tenderness: There is abdominal tenderness. There is no guarding or rebound.     Comments: Mass in the right lower quadrant/ firmness  Musculoskeletal:     Right lower leg: No edema.     Left lower leg: No edema.  Skin:    General: Skin is warm.  Neurological:     General: No focal deficit present.     Mental Status: She is alert and oriented to person, place, and time.  Psychiatric:        Mood and Affect: Mood normal.        Behavior: Behavior normal.        Thought Content: Thought content normal.        Judgment: Judgment normal.     Results: Results for orders placed or performed during the hospital encounter of 12/04/18 (from the past 48 hour(s))  Occult blood, poc device     Status: None   Collection Time: 12/04/18  4:36 PM  Result Value Ref Range   Fecal Occult Bld NEGATIVE NEGATIVE  SARS CORONAVIRUS 2 (TAT 6-24 HRS) Nasopharyngeal Nasopharyngeal Swab     Status: None   Collection Time: 12/04/18  4:39 PM   Specimen: Nasopharyngeal Swab  Result Value Ref Range   SARS Coronavirus 2 NEGATIVE NEGATIVE    Comment: (NOTE) SARS-CoV-2 target nucleic acids are NOT DETECTED. The SARS-CoV-2 RNA is generally detectable in upper and lower respiratory specimens during the acute phase of infection. Negative  results do not preclude SARS-CoV-2 infection, do not rule out co-infections with other pathogens, and should not be used as  the sole basis for treatment or other patient management decisions. Negative results must be combined with clinical observations, patient history, and epidemiological information. The expected result is Negative. Fact Sheet for Patients: SugarRoll.be Fact Sheet for Healthcare Providers: https://www.woods-mathews.com/ This test is not yet approved or cleared by the [place] FDA and  has been authorized for detection and/or diagnosis of SARS-CoV-2 by FDA under an Emergency Use Authorization (EUA). This EUA will remain  in effect (meaning this test can be used) for the duration of the COVID-19 declaration under Section 56 4(b)(1) of the Act, 21 U.S.C. section 360bbb-3(b)(1), unless the authorization is terminated or revoked sooner. Performed at [facility], [place] [address] [place], [place] [address]   Vitamin B12     Status: None   Collection Time: 12/04/18  5:02 PM  Result Value Ref Range   Vitamin B-12 435 180 - 914 pg/mL    Comment: (NOTE) This assay is not validated for testing neonatal or myeloproliferative syndrome specimens for Vitamin B12 levels. Performed at [facility], [address], [place], [place] [address]   Folate     Status: None   Collection Time: 12/04/18  5:02 PM  Result Value Ref Range   Folate 38.7 >5.9 ng/mL    Comment: RESULTS CONFIRMED BY MANUAL DILUTION Performed at [facility], [address], [place], [place] [address]   Iron and TIBC     Status: Abnormal   Collection Time: 12/04/18  5:02 PM  Result Value Ref Range   Iron 26 (L) 28 - 170 ug/dL   TIBC 262 250 - 450 ug/dL   Saturation Ratios 10 (L) 10.4 - 31.8 %   UIBC 236 ug/dL    Comment: Performed at [facility], [address], [place], [place] [address]  Ferritin     Status: Abnormal   Collection Time: 12/04/18  5:02 PM  Result Value Ref Range   Ferritin 329 (H) 11 - 307 ng/mL    Comment: Performed at [facility], [address]  [place], [place] [address]  Reticulocytes     Status: Abnormal   Collection Time: 12/04/18  5:02 PM  Result Value Ref Range   Retic Ct Pct 5.1 (H) 0.4 - 3.1 %   RBC. 1.75 (L) 3.87 - 5.11 MIL/uL   Retic Count, Absolute 89.6 19.0 - 186.0 K/uL   Immature Retic Fract 19.3 (H) 2.3 - 15.9 %    Comment: Performed at [facility], [address], [place], [place] [address]  CBC     Status: Abnormal   Collection Time: 12/05/18  6:35 AM  Result Value Ref Range   WBC 15.5 (H) 4.0 - 10.5 K/uL   RBC 2.67 (L) 3.87 - 5.11 MIL/uL   Hemoglobin 7.7 (L) 12.0 - 15.0 g/dL    Comment: POST TRANSFUSION SPECIMEN   HCT 24.8 (L) 36.0 - 46.0 %   MCV 92.9 80.0 - 100.0 fL   MCH 28.8 26.0 - 34.0 pg   MCHC 31.0 30.0 - 36.0 g/dL   RDW 16.2 (H) 11.5 - 15.5 %   Platelets 412 (H) 150 - 400 K/uL   nRBC 0.0 0.0 - 0.2 %    Comment: Performed at [facility], [address], [place], [place] [address]  Glucose, capillary     Status: Abnormal   Collection Time: 12/05/18  6:36 AM  Result Value Ref  Range   Glucose-Capillary 109 (H) 70 - 99 mg/dL   Comment 1 Notify RN   Lactate dehydrogenase     Status: None   Collection Time: 12/05/18  6:56 PM  Result Value Ref Range   LDH 129 98 - 192 U/L    Comment: Performed at [facility], [address], [place], [place] [address]  Glucose, capillary     Status: Abnormal   Collection Time: 12/06/18  5:57 AM  Result Value Ref Range   Glucose-Capillary 110 (H) 70 - 99 mg/dL  CBC     Status: Abnormal   Collection Time: 12/06/18  6:08 AM  Result Value Ref Range   WBC 13.5 (H) 4.0 - 10.5 K/uL   RBC 2.86 (L) 3.87 - 5.11 MIL/uL   Hemoglobin 8.2 (L) 12.0 - 15.0 g/dL   HCT 26.6 (L) 36.0 - 46.0 %   MCV 93.0 80.0 - 100.0 fL   MCH 28.7 26.0 - 34.0 pg   MCHC 30.8 30.0 - 36.0 g/dL   RDW 16.1 (H) 11.5 - 15.5 %   Platelets 386 150 - 400 K/uL   nRBC 0.0 0.0 - 0.2 %    Comment: Performed at [facility], [address], [place], [place] [address]  Basic metabolic panel     Status: Abnormal    Collection Time: 12/06/18  6:08 AM  Result Value Ref Range   Sodium 137 135 - 145 mmol/L   Potassium 3.8 3.5 - 5.1 mmol/L    Comment: DELTA CHECK NOTED   Chloride 107 98 - 111 mmol/L   CO2 20 (L) 22 - 32 mmol/L   Glucose, Bld 103 (H) 70 - 99 mg/dL   BUN 26 (H) 8 - 23 mg/dL   Creatinine, Ser 1.87 (H) 0.44 - 1.00 mg/dL   Calcium 7.1 (L) 8.9 - 10.3 mg/dL   GFR calc non Af Amer 24 (L) >60 mL/min   GFR calc Af Amer 27 (L) >60 mL/min   Anion gap 10 5 - 15    Comment: Performed at [facility], [address] [place], [place] [address]   CT a.p personally reviewed -Cecal wall thickened, contrast through the cecum, haziness and stranding surrounding area  IMPRESSION: 1. Masslike thickening of the cecum with slightly ill-defined border and ileocecal and right common iliac lymphadenopathy. Findings are highly concerning for colon adenocarcinoma with nodal metastatic disease. 2. Multifocal circumscribed low-attenuation liver lesions demonstrate no significant interval change in size, number or configuration compared to prior imaging and are favored to be benign. No definite new lesion identified. 3. The intracardiac blood pool is hypodense relative to the adjacent myocardium consistent with anemia. 4. Additional ancillary findings as above without significant interval change.  Assessment & Plan:  [name] is a 85 y.o. female with a cecal mass consistent with cancer on imaging and CT.  She is not obstructed but has anemia which is likely secondary to the mass despite the lack of hemoccult, etc.  The patient has had a prior robotic assisted nephrectomy with Dr. Alyson Ingles and has has prior breast surgery with Dr. Dalbert Batman.  She will ultimately need to get a right hemicolectomy.  I discussed with her the option of laparoscopic right hemicolectomy at [facility], but I was unaware of her desire to go to see Dr. Dalbert Batman.    I discussed the surgery the patient, the plan to attempt a  laparoscopic resection, the risk of bleeding, infection, injury to other organs, injury to the ureter, anastomotic leak.  At this time she wants to discuss her options with Dr. Wolfgang Phoenix who has helped her make big decisions  Please let me know if I can assist. I am happy to help in anyway.  All questions were answered to the satisfaction of the patient.   Virl Cagey 12/06/2018, 4:27 PM

## 2018-12-07 ENCOUNTER — Other Ambulatory Visit: Payer: Self-pay

## 2018-12-07 DIAGNOSIS — C50911 Malignant neoplasm of unspecified site of right female breast: Secondary | ICD-10-CM

## 2018-12-07 DIAGNOSIS — D500 Iron deficiency anemia secondary to blood loss (chronic): Secondary | ICD-10-CM

## 2018-12-07 DIAGNOSIS — C662 Malignant neoplasm of left ureter: Secondary | ICD-10-CM

## 2018-12-07 DIAGNOSIS — K6389 Other specified diseases of intestine: Secondary | ICD-10-CM

## 2018-12-07 LAB — RENAL FUNCTION PANEL
Albumin: 2.8 g/dL — ABNORMAL LOW (ref 3.5–5.0)
Anion gap: 12 (ref 5–15)
BUN: 22 mg/dL (ref 8–23)
CO2: 18 mmol/L — ABNORMAL LOW (ref 22–32)
Calcium: 6.6 mg/dL — ABNORMAL LOW (ref 8.9–10.3)
Chloride: 107 mmol/L (ref 98–111)
Creatinine, Ser: 1.88 mg/dL — ABNORMAL HIGH (ref 0.44–1.00)
GFR calc Af Amer: 27 mL/min — ABNORMAL LOW (ref >60)
GFR calc non Af Amer: 23 mL/min — ABNORMAL LOW (ref >60)
Glucose, Bld: 178 mg/dL — ABNORMAL HIGH (ref 70–99)
Phosphorus: 2.3 mg/dL — ABNORMAL LOW (ref 2.5–4.6)
Potassium: 3.1 mmol/L — ABNORMAL LOW (ref 3.5–5.1)
Sodium: 137 mmol/L (ref 135–145)

## 2018-12-07 LAB — CEA: CEA: 12.8 ng/mL — ABNORMAL HIGH (ref 0.0–4.7)

## 2018-12-07 LAB — BASIC METABOLIC PANEL
Anion gap: 10 (ref 5–15)
BUN: 21 mg/dL (ref 8–23)
CO2: 20 mmol/L — ABNORMAL LOW (ref 22–32)
Calcium: 6.4 mg/dL — CL (ref 8.9–10.3)
Chloride: 105 mmol/L (ref 98–111)
Creatinine, Ser: 1.93 mg/dL — ABNORMAL HIGH (ref 0.44–1.00)
GFR calc Af Amer: 26 mL/min — ABNORMAL LOW (ref >60)
GFR calc non Af Amer: 23 mL/min — ABNORMAL LOW (ref >60)
Glucose, Bld: 182 mg/dL — ABNORMAL HIGH (ref 70–99)
Potassium: 2.9 mmol/L — ABNORMAL LOW (ref 3.5–5.1)
Sodium: 135 mmol/L (ref 135–145)

## 2018-12-07 LAB — MAGNESIUM: Magnesium: 2.3 mg/dL (ref 1.7–2.4)

## 2018-12-07 LAB — CBC
HCT: 26.7 % — ABNORMAL LOW (ref 36.0–46.0)
Hemoglobin: 8.2 g/dL — ABNORMAL LOW (ref 12.0–15.0)
MCH: 28.9 pg (ref 26.0–34.0)
MCHC: 30.7 g/dL (ref 30.0–36.0)
MCV: 94 fL (ref 80.0–100.0)
Platelets: 358 10*3/uL (ref 150–400)
RBC: 2.84 MIL/uL — ABNORMAL LOW (ref 3.87–5.11)
RDW: 16.2 % — ABNORMAL HIGH (ref 11.5–15.5)
WBC: 12.3 10*3/uL — ABNORMAL HIGH (ref 4.0–10.5)
nRBC: 0 % (ref 0.0–0.2)

## 2018-12-07 LAB — GLUCOSE, CAPILLARY: Glucose-Capillary: 105 mg/dL — ABNORMAL HIGH (ref 70–99)

## 2018-12-07 LAB — SURGICAL PATHOLOGY

## 2018-12-07 MED ORDER — POTASSIUM CHLORIDE CRYS ER 20 MEQ PO TBCR
40.0000 meq | EXTENDED_RELEASE_TABLET | ORAL | Status: AC
  Administered 2018-12-07 (×2): 40 meq via ORAL
  Filled 2018-12-07 (×2): qty 2

## 2018-12-07 MED ORDER — CALCIUM CARBONATE ANTACID 500 MG PO CHEW
1.0000 | CHEWABLE_TABLET | Freq: Two times a day (BID) | ORAL | Status: DC
  Administered 2018-12-07: 200 mg via ORAL
  Filled 2018-12-07: qty 1

## 2018-12-07 NOTE — Progress Notes (Signed)
Report given to [facility] with [place]. Patient in stable condition awaiting [facility] transport to [facility]. Night shift RN aware. Donavan Foil, RN

## 2018-12-07 NOTE — Progress Notes (Signed)
Report given to Ilda Mori, RN at [facility]. Patient to transfer when [facility] arrives for transport. Pt stable at this time awaiting transfer. Donavan Foil, RN

## 2018-12-07 NOTE — Progress Notes (Addendum)
PROGRESS NOTE    NGOC DETJEN  QQI:[id number] DOB: July 03, 1928 DOA: 12/04/2018 PCP: Kathyrn Drown, MD    Brief Narrative:  85 year old female with a history of previous breast cancer, urothelial cancer, hypertension, COPD, presented to the hospital with right lower abdominal pain and was found to be significantly anemic with a hemoglobin of 5.5.  CT scan of the abdomen pelvis indicated mass in the cecum.  She was transfused PRBCs and admitted for further work-up. -Biopsies from her colonoscopy confirms adenocarcinoma of the cecum-Surgical consultation with DR. 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 Symptomatic acute on chronic Anemia- likely related to iron deficiency in the setting of colon malignancy superimposed on baseline  anemia of chronic disease.  Patient was transfused 2 units PRBC on admission for hemoglobin of 5.3.  Subsequent hemoglobin improved to 8.2.  Continue to follow.  No evidence of ongoing bleeding at this time   2-Large ulcerated mass at ascending colon and cecum.--Biopsy/pathology is consistent with adenocarcinoma- status post colonoscopy 12/06/2018, .  Noted on CT imaging with possible lymph nodes.   -Surgical consultation with DR. Fanny Skates for resection-- transfer to [facility] on 12/07/2018.  - CEA elevated at 12.8 -Patient will need CT of the chest with contrast if renal function allows as part of work-up to help adequate staging of her malignancy -Follow-up with her oncologist Dr. Delton Coombes in [place] in about a  month  3)COPD/.  Currently stable.  No signs of wheezing.  Continue bronchodilators  4)Chronic kidney disease stage IV.  Creatinine is currently at baseline around 1.9, continue to monitor. -, renally adjust medications, avoid nephrotoxic agents / dehydration / hypotension  5)Hypertension.  Stable on amlodipine.  6) stage I breast cancer, right breast-  -Right lumpectomy on 05/23/2015, 0.7 cm IDC, grade 1, ER positive, PR negative, HER-2 negative. -She is on anastrozole at this time.  Mammogram on 09/19/2018 was BI-RADS Category 2. - has also had radiation.     7)Left ureteral transitional (high-grade) cell carcinoma- S/p robotic left distal ureteral resection on 05/11/2018 by Dr. Alyson Ingles. - PT2PN0 high-grade urothelial carcinoma, 0 out of 1 lymph nodes involved  8)FEN--hypokalemia and hypocalcemia noted-corrected for albumin calcium is about 7.6-replace and recheck electrolytes, maintain adequate hydration   DVT prophylaxis: SCDs Code Status: Full code Family Communication: Discussed with patient Disposition Plan: -Surgical consultation with DR. Fanny Skates--  transfer to [facility] on 12/07/2018  Consultants:   Gastroenterology  General surgery  Procedures:    12/06/2018--colonoscopy -Large ulcerated mass at ascending colon and cecum. Multiple biopsies taken-consistent with adenocarcinoma  Few small polyps distal to this lesion not removed. Antimicrobials:    Subjective: - -Tolerating full liquid diet well, no nausea or vomiting, no fevers or chills no chest pains or palpitations   Objective: Vitals:   12/06/18 2106 12/07/18 0511 12/07/18 0746 12/07/18 1401  BP: (!) 127/56 132/63  (!) 130/55  Pulse: 85 91  90  Resp: '16 16  18  '$ Temp: 98.9 F (37.2 C) 98.7 F (37.1 C)  98.7 F (  37.1 C)  TempSrc: Oral Oral  Oral  SpO2: 100% 100% 97% 100%  Weight:      Height:        Intake/Output Summary (Last 24 hours) at 12/07/2018 1602 Last data filed at 12/07/2018 1300 Gross per  24 hour  Intake 1630.83 ml  Output -  Net 1630.83 ml   Filed Weights   12/04/18 1437 12/06/18 1326  Weight: 62.1 kg 62.1 kg    Examination:  General exam: Appears calm and comfortable  Respiratory system: Clear to auscultation. Respiratory effort normal. Cardiovascular system: S1 & S2 heard, RRR. No JVD,  Gastrointestinal system: Abdomen is nondistended, soft and nontender.   Normal bowel sounds heard. Central nervous system: Alert and oriented. No focal neurological deficits. Extremities: No pitting edema, pedal pulses are good. Skin: No rashes, lesions or ulcers Psychiatry: Judgement and insight appear normal. Mood & affect appropriate.   Data Reviewed:    CBC: Recent Labs  Lab 12/04/18 1205 12/04/18 1406 12/05/18 0635 12/06/18 0608 12/07/18 0929  WBC 17.3* 18.1* 15.5* 13.5* 12.3*  NEUTROABS 15.0*  --   --   --   --   HGB 5.5* 5.3* 7.7* 8.2* 8.2*  HCT 19.3* 19.0* 24.8* 26.6* 26.7*  MCV 98.0 99.5 92.9 93.0 94.0  PLT 510* 493* 412* 386 094   Basic Metabolic Panel: Recent Labs  Lab 12/04/18 1205 12/04/18 1406 12/06/18 0608 12/07/18 0929  NA 138 135 137 137  135  K 4.8 4.8 3.8 3.1*  2.9*  CL 106 104 107 107  105  CO2 23 22 20* 18*  20*  GLUCOSE 131* 124* 103* 178*  182*  BUN 34* 33* 26* 22  21  CREATININE 1.95* 1.93* 1.87* 1.88*  1.93*  CALCIUM 8.3* 8.1* 7.1* 6.6*  6.4*  MG  --   --   --  2.3  PHOS  --   --   --  2.3*   GFR: Estimated Creatinine Clearance: 17.7 mL/min (A) (by C-G formula based on SCr of 1.93 mg/dL (H)). Liver Function Tests: Recent Labs  Lab 12/04/18 1205 12/07/18 0929  AST 17  --   ALT 12  --   ALKPHOS 79  --   BILITOT 0.2*  --   PROT 6.7  --   ALBUMIN 3.1* 2.8*   Recent Labs  Lab 12/04/18 1205  LIPASE 32   No results for input(s): AMMONIA in the last 168 hours. Coagulation Profile: No results for input(s): INR, PROTIME in the last 168 hours. Cardiac Enzymes: No results for input(s): CKTOTAL, CKMB, CKMBINDEX,  TROPONINI in the last 168 hours. BNP (last 3 results) No results for input(s): PROBNP in the last 8760 hours. HbA1C: No results for input(s): HGBA1C in the last 72 hours. CBG: Recent Labs  Lab 12/05/18 0636 12/06/18 0557 12/07/18 0419  GLUCAP 109* 110* 105*   Lipid Profile: No results for input(s): CHOL, HDL, LDLCALC, TRIG, CHOLHDL, LDLDIRECT in the last 72 hours. Thyroid Function Tests: No results for input(s): TSH, T4TOTAL, FREET4, T3FREE, THYROIDAB in the last 72 hours. Anemia Panel: Recent Labs    12/04/18 1702  VITAMINB12 435  FOLATE 38.7  FERRITIN 329*  TIBC 262  IRON 26*  RETICCTPCT 5.1*   Sepsis Labs: No results for input(s): PROCALCITON, LATICACIDVEN in the last 168 hours.  Recent Results (from the past 240 hour(s))  SARS CORONAVIRUS 2 (TAT 6-24 HRS) Nasopharyngeal Nasopharyngeal Swab     Status: None   Collection Time: 12/04/18  4:39 PM   Specimen:  Nasopharyngeal Swab  Result Value Ref Range Status   SARS Coronavirus 2 NEGATIVE NEGATIVE Final    Comment: (NOTE) SARS-CoV-2 target nucleic acids are NOT DETECTED. The SARS-CoV-2 RNA is generally detectable in upper and lower respiratory specimens during the acute phase of infection. Negative results do not preclude SARS-CoV-2 infection, do not rule out co-infections with other pathogens, and should not be used as the sole basis for treatment or other patient management decisions. Negative results must be combined with clinical observations, patient history, and epidemiological information. The expected result is Negative. Fact Sheet for Patients: SugarRoll.be Fact Sheet for Healthcare Providers: https://www.woods-mathews.com/ This test is not yet approved or cleared by the [place] FDA and  has been authorized for detection and/or diagnosis of SARS-CoV-2 by FDA under an Emergency Use Authorization (EUA). This EUA will remain  in effect (meaning this test can be  used) for the duration of the COVID-19 declaration under Section 56 4(b)(1) of the Act, 21 U.S.C. section 360bbb-3(b)(1), unless the authorization is terminated or revoked sooner. Performed at [facility], [place] [address] [place], [place] [address]    Radiology Studies: No results found.  Scheduled Meds: . amLODipine  5 mg Oral Daily  . anastrozole  1 mg Oral QPM  . arformoterol  15 mcg Nebulization BID  . calcium carbonate  1 tablet Oral BID WC  . furosemide  20 mg Intravenous Once  . furosemide  20 mg Oral q morning - 10a  . nortriptyline  20-30 mg Oral QHS  . potassium chloride  40 mEq Oral Q3H  . umeclidinium bromide  1 puff Inhalation Daily   Continuous Infusions: . sodium chloride 10 mL/hr (12/06/18 1655)    LOS: 2 days   Roxan Hockey, MD [facility] If 7PM-7AM, please contact night-coverage [email or web address]  12/07/2018, 4:02 PM

## 2018-12-07 NOTE — Consult Note (Signed)
[facility] Consultation Oncology  Name: [name]      MRN: [id number]    Location: A302/A302-01  Date: 12/07/2018 Time:12:56 PM   REFERRING PHYSICIAN: Dr. Joesph Fillers  REASON FOR CONSULT: Newly diagnosed colonic adenocarcinoma.   DIAGNOSIS: COLON cancer.  HISTORY OF PRESENT ILLNESS: 85 year old very pleasant white female well-known to me office visits.  She developed right lower quadrant abdominal pain on 11/30/2018 and was seen by Dr. Sallee Lange on 12/04/2018.  She was found to have a low hemoglobin and was referred to the ER.  A CT scan of the abdomen and pelvis on 12/04/2018 showed masslike thickening of the cecum with slightly ill-defined border and ileocecal and right common iliac lymphadenopathy.  Findings were highly concerning for colonic adenocarcinoma.  Multiple circumscribed low-attenuation liver lesions demonstrated no significant interval change in size or number.  She received 2 units of PRBC for hemoglobin of 5.3.  She underwent colonoscopy on 12/06/2018 which showed polypoid and ulcerated nonobstructing large mass found in the ascending colon and in the cecum.  It measured 6 cm in length.  No bleeding was present.  This was biopsied and consistent with colonic adenocarcinoma.  Patient will be transferred to [facility] long to see Dr. Dalbert Batman for right hemicolectomy.  Patient has a stage I right breast cancer in 2017 when she underwent lumpectomy.  She is on anastrozole at this time.  She also had high-grade left distal ureteral urothelial carcinoma and underwent left distal ureteral resection on 05/11/2018 by Dr. Alyson Ingles.  She reports improvement in her right lower quadrant pain at this time.  She is on clear liquids at this time.  She lives at home by herself and is independent of all ADLs and IADLs.  Family history significant for father with colon cancer and paternal cousin with colon cancer.  PAST MEDICAL HISTORY:   Past Medical History:  Diagnosis Date  . Breast cancer  ([place])   . Breast cancer, right breast ([place]) 05/01/2015  . Cancer (HCC)    BREAST CANCER   . Colon polyps   . COPD (chronic obstructive pulmonary disease) ([place])    DR. [place] Bells  . Diabetes mellitus without complication ([place])    PREDIABETIC  CONTROL W/ DIET  . Hypercholesteremia   . Hypertension   . Lung nodules   . Osteoarthritis   . Osteopenia 09/18/2013  . Osteoporosis   . Personal history of radiation therapy   . PONV (postoperative nausea and vomiting)    ' i get so sick with anesthesia"   . Shortness of breath dyspnea    W/ EXERTION  . Tick bite    3 years ago     ALLERGIES: Allergies  Allergen Reactions  . Augmentin [Amoxicillin-Pot Clavulanate] Nausea And Vomiting    Has patient had a PCN reaction causing immediate rash, facial/tongue/throat swelling, SOB or lightheadedness with hypotension: No Has patient had a PCN reaction causing severe rash involving mucus membranes or skin necrosis: No Has patient had a PCN reaction that required hospitalization: No Has patient had a PCN reaction occurring within the last 10 years: Unknown If all of the above answers are "NO", then may proceed with Cephalosporin use.    Marland Kitchen Amoxicillin Diarrhea    Has patient had a PCN reaction causing immediate rash, facial/tongue/throat swelling, SOB or lightheadedness with hypotension: No Has patient had a PCN reaction causing severe rash involving mucus membranes or skin necrosis: No Has patient had a PCN reaction that required hospitalization: No Has patient had a  PCN reaction occurring within the last 10 years: Unknown If all of the above answers are "NO", then may proceed with Cephalosporin use.   . Codeine Nausea And Vomiting  . Morphine Nausea And Vomiting  . Neomycin-Bacitracin Zn-Polymyx Itching and Rash    Unable to use Neosporin      MEDICATIONS: I have reviewed the patient's current medications.     PAST SURGICAL HISTORY Past Surgical History:  Procedure Laterality Date  .  ABDOMINAL HYSTERECTOMY    . APPENDECTOMY    . BARTHOLIN GLAND CYST EXCISION    . BREAST EXCISIONAL BIOPSY Left    benign  . BREAST LUMPECTOMY WITH RADIOACTIVE SEED LOCALIZATION Right 05/23/2015   Procedure: RIGHT BREAST LUMPECTOMY WITH RADIOACTIVE SEED LOCALIZATION;  Surgeon: Fanny Skates, MD;  Location: [place];  Service: General;  Laterality: Right;  . BREAST SURGERY     2000  LEFT BREAST  LUMP REMOVED  . cataract surgeries  Bilateral   . colonoscopy    . COLONOSCOPY  03/25/2011   Procedure: COLONOSCOPY;  Surgeon: Rogene Houston, MD;  Location: [facility];  Service: Endoscopy;  Laterality: N/A;  9:30 am  . CYSTOSCOPY W/ URETERAL STENT PLACEMENT Left 02/27/2018   Procedure: CYSTOSCOPY WITH LEFT RETROGRADE PYELOGRAM AND lEFT URETEROSCOPY WITH BIOPSY AND LEFT URETERAL STENT PLACEMENT;  Surgeon: Cleon Gustin, MD;  Location: [facility];  Service: Urology;  Laterality: Left;  . left inguinal hernia repair    . multiple toe surgeries    . ROBOT ASSITED LAPAROSCOPIC NEPHROURETERECTOMY Left 05/11/2018   Procedure: XI ROBOT ASSITED LAPAROSCOPIC LEFT DISTAL URETERECTOMY, URETERAL  REIMPLANTATION, WITH PSOAS HITCH AND BOARI FLAP. PELVIC LYMPH NODE DISSCETION AND LEFT URETERAL STENT PLACEMENT;  Surgeon: Cleon Gustin, MD;  Location: [facility];  Service: Urology;  Laterality: Left;  . TONSILLECTOMY      FAMILY HISTORY: Family History  Problem Relation Age of Onset  . Colon cancer Father     SOCIAL HISTORY:  reports that she quit smoking about 30 years ago. Her smoking use included cigarettes. She has a 100.00 pack-year smoking history. She has never used smokeless tobacco. She reports current alcohol use. She reports that she does not use drugs.  PERFORMANCE STATUS: The patient's performance status is 2 - Symptomatic, <50% confined to bed  PHYSICAL EXAM: Most Recent Vital Signs: Blood pressure 132/63, pulse 91, temperature 98.7 F (37.1 C), temperature source Oral, resp. rate 16,  height 5' 2.5" (1.588 m), weight 137 lb (62.1 kg), SpO2 97 %. BP 132/63 (BP Location: Left Arm)   Pulse 91   Temp 98.7 F (37.1 C) (Oral)   Resp 16   Ht 5' 2.5" (1.588 m)   Wt 137 lb (62.1 kg)   SpO2 97%   BMI 24.66 kg/m  General appearance: alert, cooperative and appears stated age Lungs: clear to auscultation bilaterally Heart: regular rate and rhythm Abdomen: soft, non-tender; bowel sounds normal; no masses,  no organomegaly Extremities: extremities normal, atraumatic, no cyanosis or edema Skin: Skin color, texture, turgor normal. No rashes or lesions Lymph nodes: Cervical, supraclavicular, and axillary nodes normal. Neurologic: Grossly normal  LABORATORY DATA:  Results for orders placed or performed during the hospital encounter of 12/04/18 (from the past 48 hour(s))  Lactate dehydrogenase     Status: None   Collection Time: 12/05/18  6:56 PM  Result Value Ref Range   LDH 129 98 - 192 U/L    Comment: Performed at [facility], [address] [place], [place] [address]  Glucose, capillary     Status: Abnormal   Collection Time: 12/06/18  5:57 AM  Result Value Ref Range   Glucose-Capillary 110 (H) 70 - 99 mg/dL  CEA     Status: Abnormal   Collection Time: 12/06/18  6:08 AM  Result Value Ref Range   CEA 12.8 (H) 0.0 - 4.7 ng/mL    Comment: (NOTE)                             Nonsmokers          <3.9                             Smokers             <5.6 [employer] Electrochemiluminescence Immunoassay (ECLIA) Values obtained with different assay methods or kits cannot be used interchangeably.  Results cannot be interpreted as absolute evidence of the presence or absence of malignant disease. Performed At: [facility] [place], [place] [id number] Rush Farmer MD YI:[id number]   CBC     Status: Abnormal   Collection Time: 12/06/18  6:08 AM  Result Value Ref Range   WBC 13.5 (H) 4.0 - 10.5 K/uL   RBC 2.86 (L) 3.87 - 5.11 MIL/uL    Hemoglobin 8.2 (L) 12.0 - 15.0 g/dL   HCT 26.6 (L) 36.0 - 46.0 %   MCV 93.0 80.0 - 100.0 fL   MCH 28.7 26.0 - 34.0 pg   MCHC 30.8 30.0 - 36.0 g/dL   RDW 16.1 (H) 11.5 - 15.5 %   Platelets 386 150 - 400 K/uL   nRBC 0.0 0.0 - 0.2 %    Comment: Performed at [facility], [address] [place], [place] [address]  Basic metabolic panel     Status: Abnormal   Collection Time: 12/06/18  6:08 AM  Result Value Ref Range   Sodium 137 135 - 145 mmol/L   Potassium 3.8 3.5 - 5.1 mmol/L    Comment: DELTA CHECK NOTED   Chloride 107 98 - 111 mmol/L   CO2 20 (L) 22 - 32 mmol/L   Glucose, Bld 103 (H) 70 - 99 mg/dL   BUN 26 (H) 8 - 23 mg/dL   Creatinine, Ser 1.87 (H) 0.44 - 1.00 mg/dL   Calcium 7.1 (L) 8.9 - 10.3 mg/dL   GFR calc non Af Amer 24 (L) >60 mL/min   GFR calc Af Amer 27 (L) >60 mL/min   Anion gap 10 5 - 15    Comment: Performed at [facility], [address] [place],  [address]  Surgical pathology     Status: None   Collection Time: 12/06/18  3:00 PM  Result Value Ref Range   SURGICAL PATHOLOGY      SURGICAL PATHOLOGY CASE: [id number] PATIENT: [facility]     Clinical History: Cecal mass     DIAGNOSIS:  A. COLON, CECAL, BIOPSY: -  Adenocarcinoma -  See comment  COMMENT:  Dr. Saralyn Pilar reviewed the case and agrees with the above diagnosis.  Dr. Laural Golden was notified of these results on December 07, 2018.   GROSS DESCRIPTION:  The specimen is received in formalin and consists of multiple pieces of tan-pink soft tissue, ranging from 0.1 to 0.4 cm in greatest dimension. The specimen is entirely submitted in one block.    Final Diagnosis performed by Thressa Sheller,  MD.   Electronically signed 12/07/2018 Technical component performed at [facility], [place] [address], [place], [place] [address].  Professional component performed at [employer]. [facility], [place] [address], [place], [place] [address].   Immunohistochemistry Technical component (if applicable) was performed at [facility]. [address], [place], [place], [place] [address].   IMMUNOHISTOCHEMISTRY DISCLAIMER (if applicable): Some of these immunohistochemical stains may have been developed and the performance characteristics determine by [facility]. Some may not have been cleared or approved by the U.S. Food and Drug Administration. The FDA has determined that such clearance or approval is not necessary. This test is used for clinical purposes. It should not be regarded as investigational or for research. This laboratory is certified under the [place] (CLIA-88) as qualified to perform high complexity clinical laboratory testing.  The controls stained appropriately.   Glucose, capillary     Status: Abnormal   Collection Time: 12/07/18  4:19 AM  Result Value Ref Range   Glucose-Capillary 105 (H) 70 - 99 mg/dL  CBC     Status: Abnormal   Collection Time: 12/07/18  9:29 AM  Result Value Ref Range   WBC 12.3 (H) 4.0 - 10.5 K/uL   RBC 2.84 (L) 3.87 - 5.11 MIL/uL   Hemoglobin 8.2 (L) 12.0 - 15.0 g/dL   HCT 26.7 (L) 36.0 - 46.0 %   MCV 94.0 80.0 - 100.0 fL   MCH 28.9 26.0 - 34.0 pg   MCHC 30.7 30.0 - 36.0 g/dL   RDW 16.2 (H) 11.5 - 15.5 %   Platelets 358 150 - 400 K/uL   nRBC 0.0 0.0 - 0.2 %    Comment: Performed at [facility], [address] [place], [place] [address]  Basic metabolic panel     Status: Abnormal   Collection Time: 12/07/18  9:29 AM  Result Value Ref Range   Sodium 135 135 - 145 mmol/L   Potassium 2.9 (L) 3.5 - 5.1 mmol/L    Comment: DELTA CHECK NOTED   Chloride 105 98 - 111 mmol/L   CO2 20 (L) 22 - 32 mmol/L   Glucose, Bld 182 (H) 70 - 99 mg/dL   BUN 21 8 - 23 mg/dL   Creatinine, Ser 1.93 (H) 0.44 - 1.00 mg/dL   Calcium 6.4 (LL) 8.9 - 10.3 mg/dL    Comment: CRITICAL RESULT CALLED TO, READ BACK BY AND VERIFIED  WITH: ROGERS,A AT 10:10AM ON 12/07/18 BY FESTERMAN,C    GFR calc non Af Amer 23 (L) >60 mL/min   GFR calc Af Amer 26 (L) >60 mL/min   Anion gap 10 5 - 15    Comment: Performed at [facility], [address] [place], [place] [address]      RADIOGRAPHY: No results found.     PATHOLOGY: Colon biopsy on 12/06/2018 consistent with adenocarcinoma.  ASSESSMENT and PLAN:  1.  Cecal adenocarcinoma: - Presentation with right lower quadrant pain and severe anemia, status post 2 units of PRBC. - CTAP on 12/04/2018 showed masslike thickening of the cecum, 1 cm adjacent ileocecal lymph node.  No evidence of liver metastasis. - Colonoscopy on 12/06/2018 showed polypoid and ulcerated nonobstructing large mass found in the ascending colon and in the cecum.  Mass measured 6 cm in length.  No active bleeding present. -Biopsy consistent with adenocarcinoma. -Patient will be transferred to [facility] for surgical resection by Dr. Dalbert Batman. - Would recommend a baseline CEA level.  Would also recommend CT of the chest with contrast to complete the staging work-up. - We will obtain MMR/MSI testing on the pathology. - I will see her back in 4 weeks after surgery in my office.  2.  Normocytic anemia: - This is from chronic blood loss and CKD. - Hemoglobin improved to 8.2 after 2 units of blood transfusion. -She is receiving intermittent Feraheme infusions in our clinic.  3.  Urothelial carcinoma, high-grade of the left distal ureter: - Left distal ureteral resection on 05/11/2018 by Dr. Alyson Ingles. - PT2PN0 high-grade urothelial carcinoma, 0 out of 1 lymph nodes involved.  4.  Stage I right breast cancer: - Right lumpectomy on 05/23/2015, 0.7 cm IDC, grade 1, ER positive, PR negative, HER-2 negative. -She is on anastrozole at this time.  Mammogram on 09/19/2018 was BI-RADS Category 2.  All questions were answered. The patient knows to call the clinic with any problems, questions or concerns. We can certainly see  the patient much sooner if necessary.    Derek Jack

## 2018-12-07 NOTE — Progress Notes (Signed)
CRITICAL VALUE ALERT  Critical Value:  Calcium 6.4 and K+ 2.9  Date & Time Notied:  12/07/18 1006  Provider Notified: Dr. Denton Brick  Orders Received/Actions taken: MD states will place order to have re-drawn to verify results.

## 2018-12-07 NOTE — Progress Notes (Signed)
Patient arrived from [facility] via [facility], escorted by transport personnel; pt is alert and oriented, no signs of distress noted, no c/o pain; previous order from transferring facility continued; plan of care reviewed and discussed; floor manager aware of the admission; will continue to monitor patient.

## 2018-12-07 NOTE — Progress Notes (Signed)
  Subjective:  Patient has no complaints.  She denies melena or rectal bleeding. She is not having any difficulty with full liquids.  Objective: Blood pressure 132/63, pulse 91, temperature 98.7 F (37.1 C), temperature source Oral, resp. rate 16, height 5' 2.5" (1.588 m), weight 62.1 kg, SpO2 97 %. Patient is alert and in no acute distress.  Labs/studies Results:  CBC Latest Ref Rng & Units 12/07/2018 12/06/2018 12/05/2018  WBC 4.0 - 10.5 K/uL 12.3(H) 13.5(H) 15.5(H)  Hemoglobin 12.0 - 15.0 g/dL 8.2(L) 8.2(L) 7.7(L)  Hematocrit 36.0 - 46.0 % 26.7(L) 26.6(L) 24.8(L)  Platelets 150 - 400 K/uL 358 386 412(H)    CMP Latest Ref Rng & Units 12/07/2018 12/06/2018 12/04/2018  Glucose 70 - 99 mg/dL 182(H) 103(H) 124(H)  BUN 8 - 23 mg/dL 21 26(H) 33(H)  Creatinine 0.44 - 1.00 mg/dL 1.93(H) 1.87(H) 1.93(H)  Sodium 135 - 145 mmol/L 135 137 135  Potassium 3.5 - 5.1 mmol/L 2.9(L) 3.8 4.8  Chloride 98 - 111 mmol/L 105 107 104  CO2 22 - 32 mmol/L 20(L) 20(L) 22  Calcium 8.9 - 10.3 mg/dL 6.4(LL) 7.1(L) 8.1(L)  Total Protein 6.5 - 8.1 g/dL - - -  Total Bilirubin 0.3 - 1.2 mg/dL - - -  Alkaline Phos 38 - 126 U/L - - -  AST 15 - 41 U/L - - -  ALT 0 - 44 U/L - - -    Hepatic Function Latest Ref Rng & Units 12/04/2018 10/09/2018 04/05/2018  Total Protein 6.5 - 8.1 g/dL 6.7 6.6 7.1  Albumin 3.5 - 5.0 g/dL 3.1(L) 4.1 4.1  AST 15 - 41 U/L 17 17 32  ALT 0 - 44 U/L _0 Alk Phosphatase 38 - 126 U/L 79 91 65  Total Bilirubin 0.3 - 1.2 mg/dL 0.2(L) <0.2 0.3  Bilirubin, Direct 0.0 - 0.2 mg/dL <0.1 0.08 -    Biopsy from cecum/ascending colon reveals adenocarcinoma.  CEA is 12.8.  Assessment:  #1.  Cecal/ascending colon adenocarcinoma.  It is intriguing that she has had 3 primaries in less than 4 years.  Biopsy results reviewed with patient.  She is waiting to be transferred to Dr. Fanny Skates service at [facility].  CEA is mildly elevated.  Patient does not have mets to the liver.  All liver lesions  appear to be hepatic cysts.  #2.  Anemia secondary to GI bleed but she may have additional mechanism as well.  She has received 2 units of PRBCs during said admission.  H&H is low but stable.   Recommendations  Patient will need a right hemicolectomy. Patient waiting transfer to [facility].

## 2018-12-08 ENCOUNTER — Inpatient Hospital Stay (HOSPITAL_COMMUNITY): Payer: Medicare Other | Admitting: Anesthesiology

## 2018-12-08 ENCOUNTER — Other Ambulatory Visit: Payer: Self-pay

## 2018-12-08 ENCOUNTER — Encounter (HOSPITAL_COMMUNITY): Payer: Self-pay | Admitting: Emergency Medicine

## 2018-12-08 ENCOUNTER — Inpatient Hospital Stay (HOSPITAL_COMMUNITY): Payer: Medicare Other

## 2018-12-08 ENCOUNTER — Encounter (HOSPITAL_COMMUNITY)
Admission: EM | Disposition: A | Discharge status: Skilled Nursing Facility | Payer: Self-pay | Source: Ambulatory Visit | Attending: Internal Medicine

## 2018-12-08 DIAGNOSIS — Z01818 Encounter for other preprocedural examination: Secondary | ICD-10-CM

## 2018-12-08 DIAGNOSIS — I2510 Atherosclerotic heart disease of native coronary artery without angina pectoris: Secondary | ICD-10-CM

## 2018-12-08 DIAGNOSIS — Z9049 Acquired absence of other specified parts of digestive tract: Secondary | ICD-10-CM

## 2018-12-08 HISTORY — PX: CYSTOSCOPY W/ URETERAL STENT PLACEMENT: SHX1429

## 2018-12-08 HISTORY — PX: COLON RESECTION: SHX5231

## 2018-12-08 LAB — CBC
HCT: 35.1 % — ABNORMAL LOW (ref 36.0–46.0)
Hemoglobin: 10.5 g/dL — ABNORMAL LOW (ref 12.0–15.0)
MCH: 28.7 pg (ref 26.0–34.0)
MCHC: 29.9 g/dL — ABNORMAL LOW (ref 30.0–36.0)
MCV: 95.9 fL (ref 80.0–100.0)
Platelets: 355 10*3/uL (ref 150–400)
RBC: 3.66 MIL/uL — ABNORMAL LOW (ref 3.87–5.11)
RDW: 16.1 % — ABNORMAL HIGH (ref 11.5–15.5)
WBC: 16.5 10*3/uL — ABNORMAL HIGH (ref 4.0–10.5)
nRBC: 0 % (ref 0.0–0.2)

## 2018-12-08 LAB — CBC WITH DIFFERENTIAL/PLATELET
Abs Immature Granulocytes: 0.07 10*3/uL (ref 0.00–0.07)
Basophils Absolute: 0 10*3/uL (ref 0.0–0.1)
Basophils Relative: 0 %
Eosinophils Absolute: 0.2 10*3/uL (ref 0.0–0.5)
Eosinophils Relative: 2 %
HCT: 26.6 % — ABNORMAL LOW (ref 36.0–46.0)
Hemoglobin: 8 g/dL — ABNORMAL LOW (ref 12.0–15.0)
Immature Granulocytes: 1 %
Lymphocytes Relative: 11 %
Lymphs Abs: 1.2 10*3/uL (ref 0.7–4.0)
MCH: 28.4 pg (ref 26.0–34.0)
MCHC: 30.1 g/dL (ref 30.0–36.0)
MCV: 94.3 fL (ref 80.0–100.0)
Monocytes Absolute: 0.8 10*3/uL (ref 0.1–1.0)
Monocytes Relative: 8 %
Neutro Abs: 8.8 10*3/uL — ABNORMAL HIGH (ref 1.7–7.7)
Neutrophils Relative %: 78 %
Platelets: 347 10*3/uL (ref 150–400)
RBC: 2.82 MIL/uL — ABNORMAL LOW (ref 3.87–5.11)
RDW: 15.9 % — ABNORMAL HIGH (ref 11.5–15.5)
WBC: 11.1 10*3/uL — ABNORMAL HIGH (ref 4.0–10.5)
nRBC: 0 % (ref 0.0–0.2)

## 2018-12-08 LAB — BASIC METABOLIC PANEL
Anion gap: 7 (ref 5–15)
BUN: 16 mg/dL (ref 8–23)
CO2: 20 mmol/L — ABNORMAL LOW (ref 22–32)
Calcium: 7.2 mg/dL — ABNORMAL LOW (ref 8.9–10.3)
Chloride: 112 mmol/L — ABNORMAL HIGH (ref 98–111)
Creatinine, Ser: 1.6 mg/dL — ABNORMAL HIGH (ref 0.44–1.00)
GFR calc Af Amer: 33 mL/min — ABNORMAL LOW (ref >60)
GFR calc non Af Amer: 28 mL/min — ABNORMAL LOW (ref >60)
Glucose, Bld: 102 mg/dL — ABNORMAL HIGH (ref 70–99)
Potassium: 4.4 mmol/L (ref 3.5–5.1)
Sodium: 139 mmol/L (ref 135–145)

## 2018-12-08 LAB — GLUCOSE, CAPILLARY
Glucose-Capillary: 93 mg/dL (ref 70–99)
Glucose-Capillary: 180 mg/dL — ABNORMAL HIGH (ref 70–99)
Glucose-Capillary: 127 mg/dL — ABNORMAL HIGH (ref 70–99)

## 2018-12-08 LAB — CREATININE, SERUM
Creatinine, Ser: 1.76 mg/dL — ABNORMAL HIGH (ref 0.44–1.00)
GFR calc Af Amer: 29 mL/min — ABNORMAL LOW (ref >60)
GFR calc non Af Amer: 25 mL/min — ABNORMAL LOW (ref >60)

## 2018-12-08 LAB — PREPARE RBC (CROSSMATCH)

## 2018-12-08 LAB — SURGICAL PCR SCREEN
MRSA, PCR: NEGATIVE
Staphylococcus aureus: NEGATIVE

## 2018-12-08 LAB — POTASSIUM: Potassium: 4.7 mmol/L (ref 3.5–5.1)

## 2018-12-08 SURGERY — COLON RESECTION LAPAROSCOPIC
Anesthesia: General | Site: Abdomen | Laterality: Right

## 2018-12-08 MED ORDER — ACETAMINOPHEN 500 MG PO TABS
1000.0000 mg | ORAL_TABLET | Freq: Once | ORAL | Status: AC
  Administered 2018-12-08: 1000 mg via ORAL
  Filled 2018-12-08: qty 2

## 2018-12-08 MED ORDER — ALVIMOPAN 12 MG PO CAPS
12.0000 mg | ORAL_CAPSULE | Freq: Two times a day (BID) | ORAL | Status: DC
  Administered 2018-12-10 – 2018-12-11 (×4): 12 mg via ORAL
  Filled 2018-12-08 (×7): qty 1

## 2018-12-08 MED ORDER — SODIUM CHLORIDE 0.9 % IR SOLN
Status: DC | PRN
  Administered 2018-12-08: 3000 mL

## 2018-12-08 MED ORDER — LIDOCAINE 20MG/ML (2%) 15 ML SYRINGE OPTIME
INTRAMUSCULAR | Status: DC | PRN
  Administered 2018-12-08: 1 mg/kg/h via INTRAVENOUS

## 2018-12-08 MED ORDER — SODIUM CHLORIDE 0.9% IV SOLUTION
Freq: Once | INTRAVENOUS | Status: AC
  Administered 2018-12-08: 12:00:00 via INTRAVENOUS

## 2018-12-08 MED ORDER — SODIUM CHLORIDE 0.9 % IV SOLN
INTRAVENOUS | Status: DC
  Filled 2018-12-08: qty 6

## 2018-12-08 MED ORDER — SODIUM CHLORIDE 0.9 % IV SOLN
2.0000 g | Freq: Two times a day (BID) | INTRAVENOUS | Status: AC
  Administered 2018-12-09: 2 g via INTRAVENOUS
  Filled 2018-12-08: qty 2

## 2018-12-08 MED ORDER — DEXAMETHASONE SODIUM PHOSPHATE 10 MG/ML IJ SOLN
INTRAMUSCULAR | Status: AC
  Filled 2018-12-08: qty 1

## 2018-12-08 MED ORDER — SODIUM CHLORIDE 0.9 % IV SOLN
INTRAVENOUS | Status: DC | PRN
  Administered 2018-12-08: 09:00:00 250 mL via INTRAVENOUS

## 2018-12-08 MED ORDER — SODIUM CHLORIDE 0.9 % IV SOLN
2.0000 g | INTRAVENOUS | Status: AC
  Administered 2018-12-08: 2 g via INTRAVENOUS
  Filled 2018-12-08 (×2): qty 2

## 2018-12-08 MED ORDER — IPRATROPIUM-ALBUTEROL 0.5-2.5 (3) MG/3ML IN SOLN: 3.0000 mL | Freq: Four times a day (QID) | RESPIRATORY_TRACT | Status: DC

## 2018-12-08 MED ORDER — POTASSIUM CHLORIDE 2 MEQ/ML IV SOLN: INTRAVENOUS | Status: DC

## 2018-12-08 MED ORDER — TRAMADOL HCL 50 MG PO TABS: 50.0000 mg | ORAL_TABLET | Freq: Two times a day (BID) | ORAL | Status: DC | PRN

## 2018-12-08 MED ORDER — LIDOCAINE 2% (20 MG/ML) 5 ML SYRINGE
INTRAMUSCULAR | Status: AC
  Filled 2018-12-08: qty 5

## 2018-12-08 MED ORDER — SODIUM CHLORIDE (PF) 0.9 % IJ SOLN
INTRAMUSCULAR | Status: AC
  Filled 2018-12-08: qty 20

## 2018-12-08 MED ORDER — HYDROCODONE-ACETAMINOPHEN 5-325 MG PO TABS: 1.0000 | ORAL_TABLET | ORAL | Status: DC | PRN

## 2018-12-08 MED ORDER — LACTATED RINGERS IV SOLN
INTRAVENOUS | Status: DC
  Administered 2018-12-09: 04:00:00 via INTRAVENOUS

## 2018-12-08 MED ORDER — ROCURONIUM BROMIDE 10 MG/ML (PF) SYRINGE
PREFILLED_SYRINGE | INTRAVENOUS | Status: AC
  Filled 2018-12-08: qty 10

## 2018-12-08 MED ORDER — FLUTICASONE PROPIONATE 50 MCG/ACT NA SUSP
2.0000 | Freq: Every day | NASAL | Status: DC | PRN
  Filled 2018-12-08: qty 16

## 2018-12-08 MED ORDER — DEXAMETHASONE SODIUM PHOSPHATE 10 MG/ML IJ SOLN
INTRAMUSCULAR | Status: DC | PRN
  Administered 2018-12-08: 10 mg via INTRAVENOUS

## 2018-12-08 MED ORDER — LIDOCAINE HCL (CARDIAC) PF 100 MG/5ML IV SOSY
PREFILLED_SYRINGE | INTRAVENOUS | Status: DC | PRN
  Administered 2018-12-08: 40 mg via INTRAVENOUS

## 2018-12-08 MED ORDER — ENOXAPARIN SODIUM 30 MG/0.3ML ~~LOC~~ SOLN
30.0000 mg | SUBCUTANEOUS | Status: DC
  Administered 2018-12-09 – 2018-12-15 (×7): 30 mg via SUBCUTANEOUS
  Filled 2018-12-08 (×7): qty 0.3

## 2018-12-08 MED ORDER — PROMETHAZINE HCL 25 MG/ML IJ SOLN
INTRAMUSCULAR | Status: AC
  Filled 2018-12-08: qty 1

## 2018-12-08 MED ORDER — IPRATROPIUM-ALBUTEROL 0.5-2.5 (3) MG/3ML IN SOLN: 3.0000 mL | RESPIRATORY_TRACT | Status: DC | PRN

## 2018-12-08 MED ORDER — ONDANSETRON HCL 4 MG/2ML IJ SOLN
INTRAMUSCULAR | Status: DC | PRN
  Administered 2018-12-08: 4 mg via INTRAVENOUS

## 2018-12-08 MED ORDER — PROPOFOL 10 MG/ML IV BOLUS
INTRAVENOUS | Status: AC
  Filled 2018-12-08: qty 20

## 2018-12-08 MED ORDER — LACTATED RINGERS IV SOLN
INTRAVENOUS | Status: DC
  Administered 2018-12-08: 13:00:00 via INTRAVENOUS

## 2018-12-08 MED ORDER — PANTOPRAZOLE SODIUM 40 MG IV SOLR
40.0000 mg | INTRAVENOUS | Status: DC
  Administered 2018-12-08 – 2018-12-09 (×2): 40 mg via INTRAVENOUS
  Filled 2018-12-08 (×2): qty 40

## 2018-12-08 MED ORDER — PROMETHAZINE HCL 25 MG/ML IJ SOLN
6.2500 mg | INTRAMUSCULAR | Status: AC | PRN
  Administered 2018-12-08 (×2): 6.25 mg via INTRAVENOUS

## 2018-12-08 MED ORDER — FENTANYL CITRATE (PF) 100 MCG/2ML IJ SOLN
INTRAMUSCULAR | Status: DC | PRN
  Administered 2018-12-08 (×2): 50 ug via INTRAVENOUS

## 2018-12-08 MED ORDER — FENTANYL CITRATE (PF) 250 MCG/5ML IJ SOLN
INTRAMUSCULAR | Status: AC
  Filled 2018-12-08: qty 5

## 2018-12-08 MED ORDER — INDIGOTINDISULFONATE SODIUM 8 MG/ML IJ SOLN
INTRAMUSCULAR | Status: AC
  Filled 2018-12-08: qty 5

## 2018-12-08 MED ORDER — SODIUM CHLORIDE 0.9 % IV SOLN: 1.0000 g | Freq: Once | INTRAVENOUS | Status: DC

## 2018-12-08 MED ORDER — LIDOCAINE HCL 2 % IJ SOLN
INTRAMUSCULAR | Status: AC
  Filled 2018-12-08: qty 20

## 2018-12-08 MED ORDER — INDIGOTINDISULFONATE SODIUM 8 MG/ML IJ SOLN
INTRAMUSCULAR | Status: DC | PRN
  Administered 2018-12-08: 40 mg via INTRAVENOUS

## 2018-12-08 MED ORDER — SODIUM CHLORIDE 0.9% IV SOLUTION: Freq: Once | INTRAVENOUS | Status: DC

## 2018-12-08 MED ORDER — ENSURE PRE-SURGERY PO LIQD
296.0000 mL | Freq: Once | ORAL | Status: AC
  Administered 2018-12-08: 11:00:00 296 mL via ORAL
  Filled 2018-12-08: qty 296

## 2018-12-08 MED ORDER — CHLORHEXIDINE GLUCONATE CLOTH 2 % EX PADS
6.0000 | MEDICATED_PAD | Freq: Every day | CUTANEOUS | Status: DC
  Administered 2018-12-08 – 2018-12-10 (×3): 6 via TOPICAL

## 2018-12-08 MED ORDER — ONDANSETRON HCL 4 MG PO TABS: 4.0000 mg | ORAL_TABLET | Freq: Four times a day (QID) | ORAL | Status: DC | PRN

## 2018-12-08 MED ORDER — 0.9 % SODIUM CHLORIDE (POUR BTL) OPTIME
TOPICAL | Status: DC | PRN
  Administered 2018-12-08: 3000 mL

## 2018-12-08 MED ORDER — PROMETHAZINE HCL 25 MG/ML IJ SOLN
12.5000 mg | Freq: Once | INTRAMUSCULAR | Status: AC
  Administered 2018-12-09: 12.5 mg via INTRAVENOUS
  Filled 2018-12-08: qty 1

## 2018-12-08 MED ORDER — ROCURONIUM BROMIDE 100 MG/10ML IV SOLN
INTRAVENOUS | Status: DC | PRN
  Administered 2018-12-08: 10 mg via INTRAVENOUS
  Administered 2018-12-08: 50 mg via INTRAVENOUS

## 2018-12-08 MED ORDER — FENTANYL CITRATE (PF) 100 MCG/2ML IJ SOLN
INTRAMUSCULAR | Status: AC
  Filled 2018-12-08: qty 2

## 2018-12-08 MED ORDER — SUGAMMADEX SODIUM 200 MG/2ML IV SOLN
INTRAVENOUS | Status: DC | PRN
  Administered 2018-12-08: 250 mg via INTRAVENOUS

## 2018-12-08 MED ORDER — ONDANSETRON HCL 4 MG/2ML IJ SOLN
INTRAMUSCULAR | Status: AC
  Filled 2018-12-08: qty 2

## 2018-12-08 MED ORDER — FENTANYL CITRATE (PF) 100 MCG/2ML IJ SOLN
25.0000 ug | INTRAMUSCULAR | Status: DC | PRN
  Administered 2018-12-08: 50 ug via INTRAVENOUS

## 2018-12-08 MED ORDER — CALCIUM GLUCONATE-NACL 1-0.675 GM/50ML-% IV SOLN
1.0000 g | Freq: Once | INTRAVENOUS | Status: AC
  Administered 2018-12-08: 1000 mg via INTRAVENOUS
  Filled 2018-12-08: qty 50

## 2018-12-08 MED ORDER — HYDROMORPHONE HCL 1 MG/ML IJ SOLN: 0.5000 mg | INTRAMUSCULAR | Status: DC | PRN

## 2018-12-08 MED ORDER — CHLORHEXIDINE GLUCONATE CLOTH 2 % EX PADS
6.0000 | MEDICATED_PAD | Freq: Once | CUTANEOUS | Status: AC
  Administered 2018-12-08: 6 via TOPICAL

## 2018-12-08 MED ORDER — PROPOFOL 10 MG/ML IV BOLUS
INTRAVENOUS | Status: DC | PRN
  Administered 2018-12-08: 100 mg via INTRAVENOUS

## 2018-12-08 MED ORDER — LIDOCAINE 20MG/ML (2%) 15 ML SYRINGE OPTIME: INTRAMUSCULAR | Status: DC | PRN

## 2018-12-08 MED ORDER — ONDANSETRON HCL 4 MG/2ML IJ SOLN
4.0000 mg | Freq: Four times a day (QID) | INTRAMUSCULAR | Status: DC | PRN
  Administered 2018-12-08: 4 mg via INTRAVENOUS
  Filled 2018-12-08: qty 2

## 2018-12-08 MED ORDER — IPRATROPIUM-ALBUTEROL 0.5-2.5 (3) MG/3ML IN SOLN
3.0000 mL | Freq: Four times a day (QID) | RESPIRATORY_TRACT | Status: AC
  Administered 2018-12-08 – 2018-12-09 (×2): 3 mL via RESPIRATORY_TRACT
  Filled 2018-12-08 (×3): qty 3

## 2018-12-08 MED ORDER — CHLORHEXIDINE GLUCONATE CLOTH 2 % EX PADS
6.0000 | MEDICATED_PAD | Freq: Once | CUTANEOUS | Status: AC
  Administered 2018-12-08: 07:00:00 6 via TOPICAL

## 2018-12-08 MED ORDER — ALVIMOPAN 12 MG PO CAPS
12.0000 mg | ORAL_CAPSULE | ORAL | Status: AC
  Administered 2018-12-08: 12:00:00 12 mg via ORAL
  Filled 2018-12-08 (×3): qty 1

## 2018-12-08 MED ORDER — SODIUM CHLORIDE (PF) 0.9 % IJ SOLN
INTRAMUSCULAR | Status: DC | PRN
  Administered 2018-12-08: 20 mL

## 2018-12-08 MED ORDER — POTASSIUM CHLORIDE 2 MEQ/ML IV SOLN
INTRAVENOUS | Status: DC
  Administered 2018-12-08: 06:00:00 via INTRAVENOUS
  Filled 2018-12-08 (×3): qty 1000

## 2018-12-08 MED ORDER — ENSURE SURGERY PO LIQD
237.0000 mL | Freq: Two times a day (BID) | ORAL | Status: DC
  Administered 2018-12-11 – 2018-12-14 (×4): 237 mL via ORAL
  Filled 2018-12-08 (×14): qty 237

## 2018-12-08 MED ORDER — SUCCINYLCHOLINE CHLORIDE 200 MG/10ML IV SOSY
PREFILLED_SYRINGE | INTRAVENOUS | Status: AC
  Filled 2018-12-08: qty 10

## 2018-12-08 MED ORDER — BUPIVACAINE LIPOSOME 1.3 % IJ SUSP
20.0000 mL | Freq: Once | INTRAMUSCULAR | Status: AC
  Administered 2018-12-08: 20 mL
  Filled 2018-12-08: qty 20

## 2018-12-08 SURGICAL SUPPLY — 62 items
ADAPTER GOLDBERG URETERAL (ADAPTER) ×2 IMPLANT
APPLIER CLIP ROT 10 11.4 M/L (STAPLE)
BAG URO CATCHER STRL LF (MISCELLANEOUS) ×4 IMPLANT
BLADE EXTENDED COATED 6.5IN (ELECTRODE) ×4 IMPLANT
BLADE HEX COATED 2.75 (ELECTRODE) ×6 IMPLANT
CATH INTERMIT  6FR 70CM (CATHETERS) ×4 IMPLANT
CLIP APPLIE ROT 10 11.4 M/L (STAPLE) IMPLANT
CLOSURE WOUND 1/2 X4 (GAUZE/BANDAGES/DRESSINGS)
CLOTH BEACON ORANGE TIMEOUT ST (SAFETY) ×4 IMPLANT
COVER MAYO STAND STRL (DRAPES) ×12 IMPLANT
COVER SURGICAL LIGHT HANDLE (MISCELLANEOUS) ×8 IMPLANT
COVER WAND RF STERILE (DRAPES) ×2 IMPLANT
DECANTER SPIKE VIAL GLASS SM (MISCELLANEOUS) ×6 IMPLANT
DRAPE SURG IRRIG POUCH 19X23 (DRAPES) ×4 IMPLANT
DRAPE UTILITY XL STRL (DRAPES) ×6 IMPLANT
DRSG OPSITE POSTOP 4X10 (GAUZE/BANDAGES/DRESSINGS) ×2 IMPLANT
DRSG OPSITE POSTOP 4X6 (GAUZE/BANDAGES/DRESSINGS) IMPLANT
DRSG OPSITE POSTOP 4X8 (GAUZE/BANDAGES/DRESSINGS) IMPLANT
DRSG TELFA 3X8 NADH (GAUZE/BANDAGES/DRESSINGS) ×4 IMPLANT
ELECT BLADE TIP CTD 4 INCH (ELECTRODE) ×2 IMPLANT
ELECT PENCIL ROCKER SW 15FT (MISCELLANEOUS) ×6 IMPLANT
ELECT REM PT RETURN 15FT ADLT (MISCELLANEOUS) ×6 IMPLANT
GAUZE SPONGE 4X4 12PLY STRL (GAUZE/BANDAGES/DRESSINGS) ×8 IMPLANT
GLOVE BIOGEL M STRL SZ7.5 (GLOVE) ×4 IMPLANT
GLOVE EUDERMIC 7 POWDERFREE (GLOVE) ×16 IMPLANT
GOWN STRL REUS W/TWL LRG LVL3 (GOWN DISPOSABLE) ×8 IMPLANT
GOWN STRL REUS W/TWL XL LVL3 (GOWN DISPOSABLE) ×32 IMPLANT
GUIDEWIRE STR DUAL SENSOR (WIRE) ×4 IMPLANT
KIT TURNOVER KIT A (KITS) IMPLANT
LIGASURE IMPACT 36 18CM CVD LR (INSTRUMENTS) ×2 IMPLANT
MANIFOLD NEPTUNE II (INSTRUMENTS) ×4 IMPLANT
PACK COLON (CUSTOM PROCEDURE TRAY) ×8 IMPLANT
PACK CYSTO (CUSTOM PROCEDURE TRAY) ×4 IMPLANT
PAD DRESSING TELFA 3X8 NADH (GAUZE/BANDAGES/DRESSINGS) IMPLANT
RELOAD PROXIMATE 75MM BLUE (ENDOMECHANICALS) ×12 IMPLANT
RELOAD STAPLE 75 3.8 BLU REG (ENDOMECHANICALS) IMPLANT
SPONGE LAP 18X18 RF (DISPOSABLE) ×8 IMPLANT
STAPLER GUN LINEAR PROX 60 (STAPLE) ×2 IMPLANT
STAPLER VISISTAT 35W (STAPLE) ×6 IMPLANT
STRIP CLOSURE SKIN 1/2X4 (GAUZE/BANDAGES/DRESSINGS) IMPLANT
SUT NOV 1 T60/GS (SUTURE) IMPLANT
SUT NOVA NAB DX-16 0-1 5-0 T12 (SUTURE) IMPLANT
SUT NOVA T20/GS 25 (SUTURE) IMPLANT
SUT PDS AB 1 CT1 27 (SUTURE) IMPLANT
SUT PDS AB 1 CTX 36 (SUTURE) ×4 IMPLANT
SUT PDS AB 1 TP1 96 (SUTURE) IMPLANT
SUT PROLENE 2 0 KS (SUTURE) IMPLANT
SUT PROLENE 2 0 SH DA (SUTURE) IMPLANT
SUT SILK 0 SH 30 (SUTURE) IMPLANT
SUT SILK 2 0 (SUTURE) ×2
SUT SILK 2 0 SH CR/8 (SUTURE) ×6 IMPLANT
SUT SILK 2-0 18XBRD TIE 12 (SUTURE) ×4 IMPLANT
SUT SILK 3 0 (SUTURE) ×2
SUT SILK 3 0 SH CR/8 (SUTURE) ×8 IMPLANT
SUT SILK 3-0 18XBRD TIE 12 (SUTURE) ×4 IMPLANT
SYR 20ML LL LF (SYRINGE) ×2 IMPLANT
TOWEL OR 17X26 10 PK STRL BLUE (TOWEL DISPOSABLE) IMPLANT
TOWEL OR NON WOVEN STRL DISP B (DISPOSABLE) ×6 IMPLANT
TRAY FOLEY MTR SLVR 14FR STAT (SET/KITS/TRAYS/PACK) ×2 IMPLANT
TUBING CONNECTING 10 (TUBING) ×5 IMPLANT
TUBING CONNECTING 10' (TUBING) ×3
TUBING UROLOGY SET (TUBING) IMPLANT

## 2018-12-08 NOTE — Progress Notes (Addendum)
Blood transfusion that was running upon arrival to short stay moved from Left hand IV to 22g AC (left) due to drainage from the Left hand IV. Left hand IV removed.

## 2018-12-08 NOTE — Anesthesia Procedure Notes (Signed)
Procedure Name: Intubation Date/Time: 12/08/2018 1:33 PM Performed by: Glory Buff, CRNA Pre-anesthesia Checklist: Patient identified, Emergency Drugs available, Suction available and Patient being monitored Patient Re-evaluated:Patient Re-evaluated prior to induction Oxygen Delivery Method: Circle system utilized Preoxygenation: Pre-oxygenation with 100% oxygen Induction Type: IV induction Ventilation: Mask ventilation without difficulty Laryngoscope Size: 3 and Mac Grade View: Grade I Tube type: Oral Tube size: 7.0 mm Number of attempts: 1 Airway Equipment and Method: Stylet Placement Confirmation: ETT inserted through vocal cords under direct vision,  positive ETCO2 and breath sounds checked- equal and bilateral Secured at: 21 cm Tube secured with: Tape Dental Injury: Teeth and Oropharynx as per pre-operative assessment  Comments: Intubated by Orma Flaming, SRNA

## 2018-12-08 NOTE — Interval H&P Note (Signed)
History and Physical Interval Note:  12/08/2018 12:29 PM  [name]  has presented today for surgery, with the diagnosis of OBSTRUCTION.  The various methods of treatment have been discussed with the patient and family. After consideration of risks, benefits and other options for treatment, the patient has consented to  Procedure(s): OPEN SEGMENTAL COLON RESECTION CYSTOSCOPY WITH RETROGRADE PYELOGRAM/URETERAL STENT PLACEMENT (Bilateral) as a surgical intervention.  The patient's history has been reviewed, patient examined, no change in status, stable for surgery.  I have reviewed the patient's chart and labs.  Questions were answered to the patient's satisfaction.     Adin Hector

## 2018-12-08 NOTE — Progress Notes (Addendum)
PROGRESS NOTE    [name]  YIF:[id number]  DOB: May 05, 1928  DOA: 12/04/2018 PCP: Kathyrn Drown, MD  Brief Narrative:   85 y.o. 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 Alyson Ingles, ? CAD (patient denies), hypertension, COPD/emphysema who has home 02 for PRN use, prediabetes not on medications, CKD -IV with baseline creatinine around 1.9 and GFR 20-24, LVH ( echo 2018 revealed Mild LVH with LVEF 70-75% and grossly normal diastolic function) presented to [facility] with c/o lower abdominal pain, fatigue and noted to have Hgb of 5.5 at PCP office prompting referral to ED. CT abdomen and pelvis was ordered without contrast-suggested adenocarcinoma of the colon with nodal metastasis. She received 2 units of PRBC at [facility] and admitted to Hospitalist service with GI, Rodriguez Hevia and Oncology consultations. She underwent colonoscopy on 9/23 revealed large ulcerated mass at ascending colon, terminal ileum and cecum.--Biopsy/pathology is consistent with adenocarcinoma. CT of the chest planned as outpatient to help adequate staging of her malignancy through her primary oncologist Dr. Delton Coombes in [place]. GS Dr Dalbert Batman consulted for tumor resection and patient transferred to [facility] overnight. Patient planned for operative intervention this afternoon and Urology anticipated to be involved for intraoperative cystoscopy and placement of right ureteral stent.  Per records at [facility], it appears that patients renal function remained stable, she however was noted to have hypocalcemia (low even when corrected to albumin) and she received IV/po replacement. Hospitalist service requested to perform urgent pre-op risk eval this morning.  Subjective: Patient resting comfortably. Denies any c/o of chest pain/dyspnea/leg swellings/headache/nausea/abdominal pain.  Saturating well on RA.  Per admission H&P-- "Patient reports chronic difficulty breathing, but also reports feeling easily winded with little activity over the past few days" . Patient denies dyspnea but reports fatigue mostly. Symptoms could have been related to anemia. She says she uses a cane for ambulation to support her knees. She was apparently issued home 02 for PRN use about 3 yrs back but barely uses it. She has a home pulsoximeter which usually reads high 90s on RA. H&P also mentions h/o CAD but patient denies this. She is not on asa and never had a stress test per her knowledge. She says she could walk about a block without dyspnea/chest pain a month prior although she feels her ambulation is mostly limited due to knee arthritis.   Objective: Vitals:   12/07/18 1950 12/07/18 2110 12/08/18 0514 12/08/18 0739  BP:  (!) 146/67 127/64   Pulse:  92 83   Resp:  18 19   Temp:  98.6 F (37 C) 98 F (36.7 C)   TempSrc:  Oral    SpO2: 97% 96% 100% 98%  Weight:      Height:        Intake/Output Summary (Last 24 hours) at 12/08/2018 0749 Last data filed at 12/08/2018 8676 Gross per 24 hour  Intake 1770 ml  Output 300 ml  Net 1470 ml   Filed Weights   12/04/18 1437 12/06/18 1326  Weight: 62.1 kg 62.1 kg    Physical Examination:  General exam: Appears calm and comfortable  Respiratory system: Clear to auscultation. Respiratory effort normal. Cardiovascular system: S1 & S2 heard, + systolic murmur best heard in aortic area, No JVD, rubs, gallops or clicks. No pedal edema. Gastrointestinal system: Abdomen is nondistended, soft and nontender. Normal bowel sounds heard. Central nervous system: Alert  and oriented. No focal neurological deficits. Extremities: Symmetric 5 x 5 power. Skin: No rashes, lesions or ulcers. +varicosities in LE Psychiatry: Judgement and insight appear normal. Mood & affect appropriate.     Data Reviewed: I have personally reviewed following labs and imaging  studies  CBC: Recent Labs  Lab 12/04/18 1205 12/04/18 1406 12/05/18 0635 12/06/18 0608 12/07/18 0929 12/08/18 0541  WBC 17.3* 18.1* 15.5* 13.5* 12.3* 11.1*  NEUTROABS 15.0*  --   --   --   --  8.8*  HGB 5.5* 5.3* 7.7* 8.2* 8.2* 8.0*  HCT 19.3* 19.0* 24.8* 26.6* 26.7* 26.6*  MCV 98.0 99.5 92.9 93.0 94.0 94.3  PLT 510* 493* 412* 386 358 007   Basic Metabolic Panel: Recent Labs  Lab 12/04/18 1205 12/04/18 1406 12/06/18 0608 12/07/18 0929 12/08/18 0541  NA 138 135 137 137  135 139  K 4.8 4.8 3.8 3.1*  2.9* 4.4  CL 106 104 107 107  105 112*  CO2 23 22 20* 18*  20* 20*  GLUCOSE 131* 124* 103* 178*  182* 102*  BUN 34* 33* 26* '22  21 16  '$ CREATININE 1.95* 1.93* 1.87* 1.88*  1.93* 1.60*  CALCIUM 8.3* 8.1* 7.1* 6.6*  6.4* 7.2*  MG  --   --   --  2.3  --   PHOS  --   --   --  2.3*  --    GFR: Estimated Creatinine Clearance: 21.3 mL/min (A) (by C-G formula based on SCr of 1.6 mg/dL (H)). Liver Function Tests: Recent Labs  Lab 12/04/18 1205 12/07/18 0929  AST 17  --   ALT 12  --   ALKPHOS 79  --   BILITOT 0.2*  --   PROT 6.7  --   ALBUMIN 3.1* 2.8*   Recent Labs  Lab 12/04/18 1205  LIPASE 32   No results for input(s): AMMONIA in the last 168 hours. Coagulation Profile: No results for input(s): INR, PROTIME in the last 168 hours. Cardiac Enzymes: No results for input(s): CKTOTAL, CKMB, CKMBINDEX, TROPONINI in the last 168 hours. BNP (last 3 results) No results for input(s): PROBNP in the last 8760 hours. HbA1C: No results for input(s): HGBA1C in the last 72 hours. CBG: Recent Labs  Lab 12/05/18 0636 12/06/18 0557 12/07/18 0419 12/08/18 0511  GLUCAP 109* 110* 105* 93   Lipid Profile: No results for input(s): CHOL, HDL, LDLCALC, TRIG, CHOLHDL, LDLDIRECT in the last 72 hours. Thyroid Function Tests: No results for input(s): TSH, T4TOTAL, FREET4, T3FREE, THYROIDAB in the last 72 hours. Anemia Panel: No results for input(s): VITAMINB12, FOLATE,  FERRITIN, TIBC, IRON, RETICCTPCT in the last 72 hours. Sepsis Labs: No results for input(s): PROCALCITON, LATICACIDVEN in the last 168 hours.  Recent Results (from the past 240 hour(s))  SARS CORONAVIRUS 2 (TAT 6-24 HRS) Nasopharyngeal Nasopharyngeal Swab     Status: None   Collection Time: 12/04/18  4:39 PM   Specimen: Nasopharyngeal Swab  Result Value Ref Range Status   SARS Coronavirus 2 NEGATIVE NEGATIVE Final    Comment: (NOTE) SARS-CoV-2 target nucleic acids are NOT DETECTED. The SARS-CoV-2 RNA is generally detectable in upper and lower respiratory specimens during the acute phase of infection. Negative results do not preclude SARS-CoV-2 infection, do not rule out co-infections with other pathogens, and should not be used as the sole basis for treatment or other patient management decisions. Negative results must be combined with clinical observations, patient history, and epidemiological information. The expected result is Negative. Fact Sheet  for Patients: SugarRoll.be Fact Sheet for Healthcare Providers: https://www.woods-mathews.com/ This test is not yet approved or cleared by the [place] FDA and  has been authorized for detection and/or diagnosis of SARS-CoV-2 by FDA under an Emergency Use Authorization (EUA). This EUA will remain  in effect (meaning this test can be used) for the duration of the COVID-19 declaration under Section 56 4(b)(1) of the Act, 21 U.S.C. section 360bbb-3(b)(1), unless the authorization is terminated or revoked sooner. Performed at [facility], [place] [address] [place], [place] [address]       Radiology Studies: No results found.      Scheduled Meds: . sodium chloride   Intravenous Once  . alvimopan  12 mg Oral On Call to OR  . amLODipine  5 mg Oral Daily  . anastrozole  1 mg Oral QPM  . arformoterol  15 mcg Nebulization BID  . calcium carbonate  1 tablet Oral BID WC  .  Chlorhexidine Gluconate Cloth  6 each Topical Once  . clindamycin / gentamicin INTRAPERITONEAL Lavage irrigation   Irrigation To OR  . furosemide  20 mg Intravenous Once  . furosemide  20 mg Oral q morning - 10a  . nortriptyline  20-30 mg Oral QHS  . umeclidinium bromide  1 puff Inhalation Daily   Continuous Infusions: . sodium chloride 10 mL/hr (12/06/18 1655)  . calcium gluconate    . cefoTEtan (CEFOTAN) IV    . lactated ringers with kcl 100 mL/hr at 12/08/18 0618    Assessment & Plan:    1. Colon adencarcinoma: Plan for partial colectetomy/possible colostomy this afternoon. Per CT -there may be some enlarged lymph nodes in the regional mesentery.  CEA is 12.8. She will need metastaitic w/u at some point through primary oncologist. Her last PET scan was in 1/20. See below for perioperative risk assessment.   2. Acute blood loss anemia on chronic anemia: in the setting of #1. S/P PRBC transfusion x 2 at [facility]. Will need close monitoring postoperatively and transfuse FOR <7.   3. CAD: She denies this h/o. No c/o chest pain pain although reported exertional dyspnea/fatigue on presentation in the setting of severe anemia. EKG this morning NSR, QTc 421m. Of interest,PET study in 03/2018 did mention "Aortic atherosclerosis, in addition to left main and 3 vessel coronary artery disease".  Patient will benefit from outpatient cardiology eval /stress test. May benefit from asa/statin therapy postoperatively.   4. COPD: stable with no wheezing. Saturating well on RA. Neb rx and pulmonary toilet post operatively.   5. Hypokalemia/Hypocalcemia: Mag level wnl. Corrected potassium level this morning with replacement. Still has some low calcium level (corrected calcium around 8). Will orderd additional IV replacement.   6. CKD-IV: creatinine stable and improved now at 1.6 (baseline around 1.9) and GFR at 28.  7. Transitional cell left Ureteral : s/p resection by Dr MAlyson Inglesin 04/2018. Urology  anticipated to be involved for intraoperative cystoscopy and placement of right ureteral stent this afternoon.   8. Right Breast cancer, stage 1: Right lumpectomy/radiation in 2017, 0.7 cm IDC, grade 1, ER positive, PR negative, HER-2 negative.She is on anastrozole at this time. Mammogram on 09/19/2018 was BI-RADS Category 2.  9.  Prediabetes: Not on any medications at baseline. HgbA1C in 2/20 was 5.2  10. HTN: Continue Amlodipine.   Preoperative Risk assessment: Given all the above underlying medication conditions, plan for intraperitoneal procedure  and detailed clinical evaluation, per RCRI (revised cardiac risk index),  patient's rate for  MI/pulmonary edema/arrythmias is 1.3-3.6% and risk of cardiac death/non fatal cardiac arrest is 1-2.4%.This was discussed with patient and she wishes to proceed with surgical intervention this afternoon. Remains NPO.  Would advise additional IV calcium replacement (ordered) to minimize risk of cardiac arrythmias. Also advise aggressive peri-op pulmonary toilet including neb treatments, close monitoring of 02 sats and frequent incentive spirometry.  Patient otherwise is medically optimized for surgical intervention for this afternoon. Communicated with GS APP.   DVT prophylaxis: SCDs for now. Lovenox when okay per surgery  Code Status: Full code Family / Patient Communication: D/W patient and all questions answered to her satisfaction, d/w bedside nurse Disposition Plan: likely home when medically clear     LOS: 3 days    Time spent: 45 minutes    Guilford Shi, MD [facility] Pager [phone]  If 7PM-7AM, please contact night-coverage [email or web address] Password [facility] 12/08/2018, 7:49 AM

## 2018-12-08 NOTE — Anesthesia Preprocedure Evaluation (Addendum)
Anesthesia Evaluation  Patient identified by MRN, date of birth, ID band Patient awake    Reviewed: Allergy & Precautions, NPO status , Patient's Chart, lab work & pertinent test results  History of Anesthesia Complications (+) PONV and history of anesthetic complications  Airway Mallampati: II  TM Distance: >3 FB Neck ROM: Full    Dental  (+) Edentulous Upper, Edentulous Lower, Dental Advisory Given   Pulmonary COPD (O2 PRN with activity),  oxygen dependent, former smoker,    breath sounds clear to auscultation (-) wheezing      Cardiovascular hypertension, Pt. on medications  Rhythm:Regular Rate:Normal     Neuro/Psych negative neurological ROS  negative psych ROS   GI/Hepatic negative GI ROS, Neg liver ROS,   Endo/Other  diabetes  Renal/GU Renal disease  negative genitourinary   Musculoskeletal negative musculoskeletal ROS (+)   Abdominal   Peds  Hematology  (+) anemia ,   Anesthesia Other Findings 85 yo F for robotic nephroureterectomy - former smoker (100 pack years, quit 03/15/88), h/o PONV, severe COPD, HTN, breast cancer (s/p XRT 2017), anemia (Hgb 8.4) - Pt last seen by pulmonologist, Dr. Simonne Maffucci, on 04/19/2018.  Per his note, "Though Ms. Georganna Skeans has severe COPD from my standpoint it is safe for her to proceed with urologic surgery. I have recommended that she use her Stiolto the day of surgery and the pulmonary service can be available to help manage her while hospitalized." - unremarkable TTE 06/2016  Reproductive/Obstetrics                            Anesthesia Physical  Anesthesia Plan  ASA: III  Anesthesia Plan: General   Post-op Pain Management:    Induction: Intravenous  PONV Risk Score and Plan: 4 or greater and Ondansetron, Dexamethasone, Propofol infusion and Treatment may vary due to age or medical condition  Airway Management Planned: Oral  ETT  Additional Equipment: None  Intra-op Plan:   Post-operative Plan: Possible Post-op intubation/ventilation and Extubation in OR  Informed Consent: I have reviewed the patients History and Physical, chart, labs and discussed the procedure including the risks, benefits and alternatives for the proposed anesthesia with the patient or authorized representative who has indicated his/her understanding and acceptance.     Dental advisory given  Plan Discussed with: CRNA and Anesthesiologist  Anesthesia Plan Comments: (Specifically discussed plan to extubate but small possibility of need for postop ventilation given history of severe COPD with intermittent home oxygen use.)       Anesthesia Quick Evaluation

## 2018-12-08 NOTE — Op Note (Signed)
Preoperative diagnosis:  Colon mass   Postoperative diagnosis:  Colon mass   Procedure:  1.  Cystoscopy 2.  Right ureteral stent placement  Surgeon:   Dr. Raynelle Bring  Anesthesia:  General  EBL: Minimal  Complications: None  Indication:  This is an 85 year old female with a history of urothelial carcinoma of the distal left ureter status post resection and ureteral reimplantation with psoas hitch.  She was found to have a colon mass and is scheduled undergo surgical resection by General surgery today.  For the purpose of intraoperative identification of the right ureter, it was recommended and requested that she undergo right ureteral stent placement.  The potential risks, complications, and the expected recovery process was discussed with the patient.  She gave informed consent.    Description of procedure:  The patient was taken to the operating room and a general anesthetic was administered.  She was given preoperative antibiotics, placed in the dorsal lithotomy position, and prepped and draped in usual sterile fashion.  A preoperative time-out was performed.    Cystourethroscopy was then performed with a 30 degree lens and a 22 French cystoscope sheath.  Inspection of bladder did reveal cloudy urine.  A urine culture was obtained.    No bladder tumors were identified.  The bladder was mildly distorted consistent with her history of left ureteral reimplantation and psoas hitch.  The right ureteral orifice was not able to be easily identified initially.  There was some distortion of the right hemi trigone with a mucosal bridge of tissue.  After further evaluation, the right ureteral orifice was eventually identified.  This was cannulated with 0.38 sensor guidewire.  A 6 French ureteral catheter was then advanced over the wire up into the renal pelvis under fluoroscopic guidance.  The wire was removed.  The stent was then secured to a 28 [place] Foley catheter which was placed.  A Goldberg  adapter was used to connect the ureteral stent and the catheter.  The patient tolerated the procedure well without complications.  The procedure was then turned over to Dr. Dalbert Batman.

## 2018-12-08 NOTE — H&P (View-Only) (Signed)
2 Days Post-Op  Subjective: This is a very pleasant 85-year-old female who lives alone in [place] and is independent.  I am involved in long-term care of her right breast cancer.  PCP is Dr. Scott Luking.  Pulmonologist is Dr. Mcquaid.  Urologist is Dr. Patrick Mackenzie.      Her breast cancer was treated in March 2017, stage I, and is thought to be in ED currently.  Followed by Dr. Katragadda.  History of transitional cell carcinoma involving left ureter and in February 2020 had robotic resection and reimplantation.  She does have a history of colonic adenomas.     She presented earlier this week with right lower quadrant pain.  She had been constipated.  No blood in her stools.  Initial hemoglobin was reportedly 5.5.  She has been transfused and is now up to 8.2.      She underwent bowel prep at [facility] and Dr. Rehman  performed colonoscopy finding an ulcerated neoplastic mass in the cecum.  Biopsies confirm adenocarcinoma. CT scan shows a bulky mass in the cecum, possibly involving the terminal ileum.  It does not appear to involve the duodenum.  The retroperitoneal of fat plane seems preserved.  There is no hydronephrosis.  There may be some enlarged lymph nodes in the regional mesentery.  CEA is 12.8.     She requested transfer to [facility] and requested that I see her in consultation and supervise her colon resection. This morning she is quite comfortable.  Not having any pain at rest.  It is her strong desire to stay in the hospital until she has surgery.  It is her strong desire that I do the surgery.  We will try to see if we can get her ready and do surgery this afternoon if the schedule can be rearranged.  She will Be kept NPO this morning.  We will check her hemoglobin and potassium again.  We will ask urology to become involved for intraoperative cystoscopy and placement of right ureteral stent We will asked triad hospitalist to perform preop risk assessment and assurance that her  medical problems are as stable as they can be.  Objective: Vital signs in last 24 hours: Temp:  [98 F (36.7 C)-98.7 F (37.1 C)] 98 F (36.7 C) (09/25 0514) Pulse Rate:  [83-92] 83 (09/25 0514) Resp:  [18-19] 19 (09/25 0514) BP: (127-146)/(55-67) 127/64 (09/25 0514) SpO2:  [96 %-100 %] 100 % (09/25 0514) Last BM Date: 12/06/18  Intake/Output from previous day: 09/24 0701 - 09/25 0700 In: 1770 [P.O.:1770] Out: -  Intake/Output this shift: Total I/O In: 240 [P.O.:240] Out: -    EXAM: General appearance: Alert.  No distress.  Oriented.  Excellent insight into her past and current medical problems. Neck: no adenopathy, no carotid bruit, no JVD, supple, symmetrical, trachea midline and thyroid not enlarged, symmetric, no tenderness/mass/nodules Resp: clear to auscultation bilaterally Breasts: normal appearance, no masses or tenderness, Well-healed scars on right.  No other skin change.  No mass no axillary adenopathy GI: Soft.  Does not seem distended.  Multiple trocar sites from robotic surgery earlier this year well-healed.  Tender right lower quadrant greater than right upper quadrant but I do not palpate a mass.  No inguinal adenopathy.  No obvious hernia. Extremities: extremities normal, atraumatic, no cyanosis or edema  Lab Results:  Recent Labs    12/06/18 0608 12/07/18 0929  WBC 13.5* 12.3*  HGB 8.2* 8.2*  HCT 26.6* 26.7*  PLT 386 358     BMET Recent Labs    12/06/18 0608 12/07/18 0929  NA 137 137  135  K 3.8 3.1*  2.9*  CL 107 107  105  CO2 20* 18*  20*  GLUCOSE 103* 178*  182*  BUN 26* 22  21  CREATININE 1.87* 1.88*  1.93*  CALCIUM 7.1* 6.6*  6.4*   PT/INR No results for input(s): LABPROT, INR in the last 72 hours. ABG No results for input(s): PHART, HCO3 in the last 72 hours.  Invalid input(s): PCO2, PO2  Studies/Results: No results found.  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  Currently no active bleeding or obstruction but symptomatic with pain -We really have only one good option which is segmental colectomy.  Because of the bulky nature of the tumor this will need done with an open laparotomy incision. -We will consider doing that surgery this afternoon if we can get her ready -We will recheck her labs to make sure her hemoglobin and potassium are reasonable and she will be typed and screened -We will asked triad hospitalist to provide risk assessment and to be sure that her medical problems are stable as they can be -We will ask urology to be involved intraoperatively for cystoscopy and placement of right ureteral stent -I discussed the indications, details, techniques, and numerous risk of the surgery with her.  She is aware of the risk of bleeding, infection, anastomotic leak, injury to adjacent organs with major reconstructive surgery, wound infection, wound hernia, cardiac pulmonary and thromboembolic problems.  She understands all these issues.  All of her questions were answered.  She agrees with this plan. -She will need CT scan of chest to complete her staging work-up at some point, but I would avoid doing that this morning as that might interfere with her preoperative assessment and preparations. -The OR has been notified and preop orders are written.  Anemia requiring transfusion.  This is acute blood loss anemia superimposed on chronic anemia.  COPD.  Moderately severe but seems stable.  Followed by Dr. Mcquaid Right breast cancer.  Status post right lumpectomy May 23, 2015 for stage I a invasive ductal carcinoma, receptor positive, HER-2 negative.  She completed radiation therapy and currently tolerating anastrozole with no evidence of disease Urothelial carcinoma left ureter, status post robotic resection distal left ureter with reimplantation February 2020  no evidence of hydronephrosis on either side Lung nodules Hypertension CKD 4 Hypokalemia DM2    LOS: 3 days    Gerado Nabers M Verline Kong 12/08/2018 

## 2018-12-08 NOTE — Care Management Important Message (Signed)
Important Message  Patient Details IM Letter given to Sharren Bridge SW to present to the Patient Name: [name] MRN: [id number] Date of Birth: [birth year]   Medicare Important Message Given:  Yes     Kerin Salen 12/08/2018, 10:38 AM

## 2018-12-08 NOTE — Progress Notes (Addendum)
2 Days Post-Op  Subjective: This is a very pleasant 85 year old female who lives alone in [place] and is independent.  I am involved in long-term care of her right breast cancer.  PCP is Dr. Sallee Lange.  Pulmonologist is Dr. Lake Bells.  Urologist is Dr. Rosie Fate.      Her breast cancer was treated in March 2017, stage I, and is thought to be in ED currently.  Followed by Dr. Delton Coombes.  History of transitional cell carcinoma involving left ureter and in February 2020 had robotic resection and reimplantation.  She does have a history of colonic adenomas.     She presented earlier this week with right lower quadrant pain.  She had been constipated.  No blood in her stools.  Initial hemoglobin was reportedly 5.5.  She has been transfused and is now up to 8.2.      She underwent bowel prep at [facility] and Dr. Laural Golden  performed colonoscopy finding an ulcerated neoplastic mass in the cecum.  Biopsies confirm adenocarcinoma. CT scan shows a bulky mass in the cecum, possibly involving the terminal ileum.  It does not appear to involve the duodenum.  The retroperitoneal of fat plane seems preserved.  There is no hydronephrosis.  There may be some enlarged lymph nodes in the regional mesentery.  CEA is 12.8.     She requested transfer to [facility] and requested that I see her in consultation and supervise her colon resection. This morning she is quite comfortable.  Not having any pain at rest.  It is her strong desire to stay in the hospital until she has surgery.  It is her strong desire that I do the surgery.  We will try to see if we can get her ready and do surgery this afternoon if the schedule can be rearranged.  She will Be kept NPO this morning.  We will check her hemoglobin and potassium again.  We will ask urology to become involved for intraoperative cystoscopy and placement of right ureteral stent We will asked triad hospitalist to perform preop risk assessment and assurance that her  medical problems are as stable as they can be.  Objective: Vital signs in last 24 hours: Temp:  [98 F (36.7 C)-98.7 F (37.1 C)] 98 F (36.7 C) (09/25 0514) Pulse Rate:  [83-92] 83 (09/25 0514) Resp:  [18-19] 19 (09/25 0514) BP: (127-146)/(55-67) 127/64 (09/25 0514) SpO2:  [96 %-100 %] 100 % (09/25 0514) Last BM Date: 12/06/18  Intake/Output from previous day: 09/24 0701 - 09/25 0700 In: 1770 [P.O.:1770] Out: -  Intake/Output this shift: Total I/O In: 240 [P.O.:240] Out: -    EXAM: General appearance: Alert.  No distress.  Oriented.  Excellent insight into her past and current medical problems. Neck: no adenopathy, no carotid bruit, no JVD, supple, symmetrical, trachea midline and thyroid not enlarged, symmetric, no tenderness/mass/nodules Resp: clear to auscultation bilaterally Breasts: normal appearance, no masses or tenderness, Well-healed scars on right.  No other skin change.  No mass no axillary adenopathy GI: Soft.  Does not seem distended.  Multiple trocar sites from robotic surgery earlier this year well-healed.  Tender right lower quadrant greater than right upper quadrant but I do not palpate a mass.  No inguinal adenopathy.  No obvious hernia. Extremities: extremities normal, atraumatic, no cyanosis or edema  Lab Results:  Recent Labs    12/06/18 0608 12/07/18 0929  WBC 13.5* 12.3*  HGB 8.2* 8.2*  HCT 26.6* 26.7*  PLT 386 358  BMET Recent Labs    12/06/18 0608 12/07/18 0929  NA 137 137  135  K 3.8 3.1*  2.9*  CL 107 107  105  CO2 20* 18*  20*  GLUCOSE 103* 178*  182*  BUN 26* 22  21  CREATININE 1.87* 1.88*  1.93*  CALCIUM 7.1* 6.6*  6.4*   PT/INR No results for input(s): LABPROT, INR in the last 72 hours. ABG No results for input(s): PHART, HCO3 in the last 72 hours.  Invalid input(s): PCO2, PO2  Studies/Results: No results found.  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  Currently no active bleeding or obstruction but symptomatic with pain -We really have only one good option which is segmental colectomy.  Because of the bulky nature of the tumor this will need done with an open laparotomy incision. -We will consider doing that surgery this afternoon if we can get her ready -We will recheck her labs to make sure her hemoglobin and potassium are reasonable and she will be typed and screened -We will asked triad hospitalist to provide risk assessment and to be sure that her medical problems are stable as they can be -We will ask urology to be involved intraoperatively for cystoscopy and placement of right ureteral stent -I discussed the indications, details, techniques, and numerous risk of the surgery with her.  She is aware of the risk of bleeding, infection, anastomotic leak, injury to adjacent organs with major reconstructive surgery, wound infection, wound hernia, cardiac pulmonary and thromboembolic problems.  She understands all these issues.  All of her questions were answered.  She agrees with this plan. -She will need CT scan of chest to complete her staging work-up at some point, but I would avoid doing that this morning as that might interfere with her preoperative assessment and preparations. -The OR has been notified and preop orders are written.  Anemia requiring transfusion.  This is acute blood loss anemia superimposed on chronic anemia.  COPD.  Moderately severe but seems stable.  Followed by Dr. Lake Bells Right breast cancer.  Status post right lumpectomy May 23, 2015 for stage I a invasive ductal carcinoma, receptor positive, HER-2 negative.  She completed radiation therapy and currently tolerating anastrozole with no evidence of disease Urothelial carcinoma left ureter, status post robotic resection distal left ureter with reimplantation February 2020  no evidence of hydronephrosis on either side Lung nodules Hypertension CKD 4 Hypokalemia DM2    LOS: 3 days    Renelda Loma M  12/08/2018

## 2018-12-08 NOTE — Op Note (Addendum)
Patient Name:           [name]   Date of Surgery:        12/08/2018  Pre op Diagnosis:      Locally advanced adenocarcinoma of the cecum with direct invasion of the ileum   Post op Diagnosis:    Same  Procedure:                 Open laparotomy, lysis of adhesions, right colectomy, small bowel resection, ileocolic anastomosis, repair of serosal injury sigmoid colon(RLQ), intraoperative TAPP block with Exparel.  Surgeon:                     Edsel Petrin. Dalbert Batman, M.D., FACS  Assistant:                      Greer Pickerel, MD, FACS   Indication for Assistant: Complex exposure, locally advanced cancer with difficult dissection in retroperitoneum.  Operative Indications:   This is a very pleasant 85 year old female who lives alone in [place] and is independent.  I am involved in long-term care of her right breast cancer.  PCP is Dr. Sallee Lange.  Pulmonologist is Dr. Lake Bells.  Urologist is Dr. Rosie Fate.      Her breast cancer was treated in March 2017, stage I, and is thought to be NED currently.  Followed by Dr. Delton Coombes.  History of transitional cell carcinoma involving left ureter and in February 2020 had robotic resection and reimplantation and Psoas hitch.  She does have a history of colonic adenomas.     She presented earlier this week with right lower quadrant pain.  She had been constipated.  No blood in her stools.  Initial hemoglobin was reportedly 5.5.  She has been transfused and is now up to 8.2.      She underwent bowel prep at [facility] and Dr. Laural Golden  performed colonoscopy finding an ulcerated neoplastic mass in the cecum.  Biopsies confirm adenocarcinoma. CT scan shows a bulky mass in the cecum, possibly involving the terminal ileum.  It does not appear to involve the duodenum.  The retroperitoneal of fat plane seems preserved.  There is no hydronephrosis.    There was no overt bowel obstruction.  There may be some enlarged lymph nodes in the regional mesentery.   CEA is 12.8.    She was transferred from [facility] to [place] last night.  She was evaluated this morning and seemed to be doing well and was stable.  After lengthy discussion we decided to go ahead with surgery today.     Operative Findings:       Dr. Alinda Money performed cystoscopy and placement of right ureteral stent.  He said the bladder was deformed but the stent was placed and was helpful.  The stent was removed at the end of the case.  There was a right lower quadrant incision, lower midline incision, and multiple trocar sites from her previous surgeries.  The liver had a couple of cysts in it and several tiny granular nodules but these did not obviously look like metastatic cancer.  No liver biopsies were taken there was a large hard tumor in the cecum larger than a grapefruit.  This was adherent to the right lateral wall but had not invaded into the psoas muscle or area of the ureter.  The ureter was preserved.  There were 2 separate loops of distal ileum that were locally invaded by  the cancer.  We had to take all of the small bowel out of the pelvis and wound up sacrificing about 2-1/2 feet of small bowel.  There were chronic adhesions from appendectomy and hysterectomy in the sigmoid colon was chronically adherent in the right lower quadrant.  There was a small serosal injury (2 X 3 cm.)  of the sigmoid colon in the right lower quadrant where the distal ileum was densely adherent by adhesions, down below the pelvic brim, which was noticed and this was repaired with silk sutures.  Anastomosis was created between the mid to distal ileum and the hepatic flexure with a stapling technique.  Procedure in Detail:          Following the induction of general endotracheal anesthesia the patient was placed in rigid padded stirrups.  Dr. Alinda Money performed cystoscopy and placement of right ureteral stent which is dictated separately.  Intravenous antibiotics were given.  Surgical timeout was  performed twice.  The patient was placed supine.  The abdomen was then prepped and draped in a sterile fashion.     Midline laparotomy was performed, somewhat more below the umbilicus than above.  The fascia was incised in the midline and the abdominal cavity was entered and explored.  A few anterior adhesions were taken down.  We identified the tumor adherent to the right lateral wall.  With scissor dissection we dissected into the abdominal wall through the peritoneum to mobilize the tumor.  We could palpate the ureteral stent well posterior to this.  This allowed [place] to mobilize the cecum medially.  We had to mobilize the involved small bowel out of the pelvis with sharp dissection.  We transected the small bowel in 2 areas with a GIA stapling device.  We mobilized the ascending colon and the hepatic flexure by dividing the lateral peritoneal attachments.  The duodenum was easily identified and preserved and dissected away.  We preserve the middle colic vessels.  The transverse colon was transected and divided just proximal to the middle colic vessels with a GIA stapling device.  Small mesenteric vessels were controlled with the LigaSure device.  Right colic vessels were large and were clamped divided and doubly ligated with 2-0 silk ties.  Small bowel mesentery had to be taken down for the loops of small bowel that were invaded by the tumor.  We did this in several small steps and ultimately removed the right colon and the small bowel as 1 specimen.     Anastomosis was created between the mid to distal ileum and the right transverse colon with a GIA stapling device.  The common defect was closed with a TA 60 stapling device.  The anastomosis was widely patent.  Several silk sutures were placed to reinforce the anastomosis at critical points.  We did not close the mesentery as it was wide open.    A partial-thickness serosal injury to the sigmoid colon was observed.  This was in the right lower quadrant.  Spent  some time defining the anatomy.  The serosal injury was repaired with multiple interrupted sutures of 3-0 silk and this looked quite good and secure.  There was no evidence of full-thickness injury to the sigmoid colon.       Dr. Alinda Money had given indigo carmine.  The urine was clear with a slight blue tent.  There was no evidence of leak within the abdomen.  We irrigated the abdomen and pelvis.  Hemostasis was excellent.     Exparel rel, 50%  solution, was injected into the retrorectus space and transversus abdominis, 20 cc on each side.      We changed over instruments, drapes, gowns and gloves.  We irrigated 1 more time and the irrigation fluid was clear.  We brought the omentum down over the anastomosis.  The midline fascia was closed with a running suture of #1 PDS.  The skin was closed loosely with staples and we placed Telfa wicks between the staples and honeycomb bandage on top.  The patient tolerated the procedure well and was taken to PACU in stable condition.       EBL 100 to 150 cc.  1 unit of packed cells was transfused.  Counts correct.  Sponge needle instrument counts were correct.  The patient was transferred to PACU in stable condition      Alfie Rideaux M. Dalbert Batman, M.D., FACS General and Minimally Invasive Surgery Breast and Colorectal Surgery  12/08/2018 3:36 PM

## 2018-12-08 NOTE — Transfer of Care (Signed)
Immediate Anesthesia Transfer of Care Note  Patient: Quinisha A Fodera  Procedure(s) Performed: OPEN SEGMENTAL COLON RESECTION (N/A Abdomen) CYSTOSCOPY WITH RETROGRADE PYELOGRAM/URETERAL STENT PLACEMENT (Right )  Patient Location: PACU  Anesthesia Type:General  Level of Consciousness: drowsy, patient cooperative and responds to stimulation  Airway & Oxygen Therapy: Patient Spontanous Breathing and Patient connected to face mask oxygen  Post-op Assessment: Report given to RN and Post -op Vital signs reviewed and stable  Post vital signs: Reviewed and stable  Last Vitals:  Vitals Value Taken Time  BP 142/61 12/08/18 1543  Temp    Pulse 87 12/08/18 1544  Resp 25 12/08/18 1544  SpO2 100 % 12/08/18 1544  Vitals shown include unvalidated device data.  Last Pain:  Vitals:   12/08/18 1229  TempSrc: Oral  PainSc: 2       Patients Stated Pain Goal: 4 (18/84/16 6063)  Complications: No apparent anesthesia complications

## 2018-12-09 LAB — COMPREHENSIVE METABOLIC PANEL
ALT: 28 U/L (ref 0–44)
AST: 26 U/L (ref 15–41)
Albumin: 2.8 g/dL — ABNORMAL LOW (ref 3.5–5.0)
Alkaline Phosphatase: 79 U/L (ref 38–126)
Anion gap: 9 (ref 5–15)
BUN: 17 mg/dL (ref 8–23)
CO2: 18 mmol/L — ABNORMAL LOW (ref 22–32)
Calcium: 7.2 mg/dL — ABNORMAL LOW (ref 8.9–10.3)
Chloride: 112 mmol/L — ABNORMAL HIGH (ref 98–111)
Creatinine, Ser: 1.77 mg/dL — ABNORMAL HIGH (ref 0.44–1.00)
GFR calc Af Amer: 29 mL/min — ABNORMAL LOW (ref >60)
GFR calc non Af Amer: 25 mL/min — ABNORMAL LOW (ref >60)
Glucose, Bld: 179 mg/dL — ABNORMAL HIGH (ref 70–99)
Potassium: 4.5 mmol/L (ref 3.5–5.1)
Sodium: 139 mmol/L (ref 135–145)
Total Bilirubin: 0.3 mg/dL (ref 0.3–1.2)
Total Protein: 5.9 g/dL — ABNORMAL LOW (ref 6.5–8.1)

## 2018-12-09 LAB — CBC
HCT: 33.3 % — ABNORMAL LOW (ref 36.0–46.0)
Hemoglobin: 10.3 g/dL — ABNORMAL LOW (ref 12.0–15.0)
MCH: 29 pg (ref 26.0–34.0)
MCHC: 30.9 g/dL (ref 30.0–36.0)
MCV: 93.8 fL (ref 80.0–100.0)
Platelets: 350 10*3/uL (ref 150–400)
RBC: 3.55 MIL/uL — ABNORMAL LOW (ref 3.87–5.11)
RDW: 16.2 % — ABNORMAL HIGH (ref 11.5–15.5)
WBC: 15.8 10*3/uL — ABNORMAL HIGH (ref 4.0–10.5)
nRBC: 0 % (ref 0.0–0.2)

## 2018-12-09 LAB — GLUCOSE, CAPILLARY: Glucose-Capillary: 161 mg/dL — ABNORMAL HIGH (ref 70–99)

## 2018-12-09 LAB — CALCIUM, IONIZED: Calcium, Ionized, Serum: 4.5 mg/dL (ref 4.5–5.6)

## 2018-12-09 MED ORDER — LABETALOL HCL 5 MG/ML IV SOLN
5.0000 mg | INTRAVENOUS | Status: DC | PRN
  Filled 2018-12-09: qty 4

## 2018-12-09 MED ORDER — CHLORHEXIDINE GLUCONATE 0.12 % MT SOLN
15.0000 mL | Freq: Two times a day (BID) | OROMUCOSAL | Status: DC
  Administered 2018-12-09 – 2018-12-15 (×12): 15 mL via OROMUCOSAL
  Filled 2018-12-09 (×12): qty 15

## 2018-12-09 MED ORDER — ORAL CARE MOUTH RINSE
15.0000 mL | Freq: Two times a day (BID) | OROMUCOSAL | Status: DC
  Administered 2018-12-09 – 2018-12-15 (×11): 15 mL via OROMUCOSAL

## 2018-12-09 NOTE — Progress Notes (Signed)
1 Day Post-Op   Subjective/Chief Complaint: No complaints Pain controlled Had a little nausea No flatus   Objective: Vital signs in last 24 hours: Temp:  [97.4 F (36.3 C)-98.9 F (37.2 C)] 97.5 F (36.4 C) (09/26 0333) Pulse Rate:  [84-104] 101 (09/26 0728) Resp:  [12-25] 18 (09/26 0728) BP: (136-173)/(54-131) 173/107 (09/26 0728) SpO2:  [94 %-100 %] 100 % (09/26 0728) Weight:  [61.7 kg-62.1 kg] 61.7 kg (09/26 0500) Last BM Date: 12/06/18  Intake/Output from previous day: 09/25 0701 - 09/26 0700 In: 3156.6 [I.V.:2618.1; Blood:338.5; IV Piggyback:200] Out: 585 [Urine:560; Blood:25] Intake/Output this shift: No intake/output data recorded.  Exam: Awake and alert Lungs clear Abdomen mildly full, minimally tender,dressing dry   Lab Results:  Recent Labs    12/08/18 1636 12/09/18 0158  WBC 16.5* 15.8*  HGB 10.5* 10.3*  HCT 35.1* 33.3*  PLT 355 350   BMET Recent Labs    12/08/18 0541 12/08/18 1636 12/09/18 0158  NA 139  --  139  K 4.4 4.7 4.5  CL 112*  --  112*  CO2 20*  --  18*  GLUCOSE 102*  --  179*  BUN 16  --  17  CREATININE 1.60* 1.76* 1.77*  CALCIUM 7.2*  --  7.2*   PT/INR No results for input(s): LABPROT, INR in the last 72 hours. ABG No results for input(s): PHART, HCO3 in the last 72 hours.  Invalid input(s): PCO2, PO2  Studies/Results: Dg C-arm 1-60 Min-no Report  Result Date: 12/08/2018 Fluoroscopy was utilized by the requesting physician.  No radiographic interpretation.    Anti-infectives: Anti-infectives (From admission, onward)   Start     Dose/Rate Route Frequency Ordered Stop   12/09/18 0130  cefoTEtan (CEFOTAN) 2 g in sodium chloride 0.9 % 100 mL IVPB     2 g 200 mL/hr over 30 Minutes Intravenous Every 12 hours 12/08/18 1740 12/09/18 0049   12/08/18 0645  cefoTEtan (CEFOTAN) 2 g in sodium chloride 0.9 % 100 mL IVPB     2 g 200 mL/hr over 30 Minutes Intravenous On call to O.R. 12/08/18 1194 12/08/18 1337   12/08/18 0645   clindamycin (CLEOCIN) 900 mg, gentamicin (GARAMYCIN) 240 mg in sodium chloride 0.9 % 1,000 mL for intraperitoneal lavage  Status:  Discontinued      Irrigation To Surgery 12/08/18 0634 12/08/18 1729      Assessment/Plan: s/p Procedure(s): OPEN SEGMENTAL COLON RESECTION (N/A) CYSTOSCOPY WITH RETROGRADE PYELOGRAM/URETERAL STENT PLACEMENT (Right)  Plan to go slow with resumption of diet given small bowel resection  Hgb, cr stable  Continue current care   LOS: 4 days    Coralie Keens 12/09/2018

## 2018-12-09 NOTE — Progress Notes (Signed)
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 Alyson Ingles, ? CAD (patient denies), hypertension, COPD/emphysema who has home 02 for PRN use, prediabetes not on medications, CKD -IV with baseline creatinine around 1.9 and GFR 20-24, LVH ( echo 2018 revealed Mild LVH with LVEF 70-75% and grossly normal diastolic function) presented to [facility] with c/o lower abdominal pain, fatigue and noted to have Hgb of 5.5 at PCP office prompting referral to ED. CT abdomen and pelvis was ordered without contrast-suggested adenocarcinoma of the colonwithnodal metastasis. She received 2 units of PRBC at [facility] and admitted to Hospitalist service with GI, [place] and Oncology consultations. She underwent colonoscopy on 9/23 revealed large ulcerated mass at ascending colon, terminal ileum and cecum.--Biopsy/pathology is consistent with adenocarcinoma. CT of the chest planned as outpatient to help adequate staging of her malignancy through her primary oncologist Dr. Delton Coombes in [place]. GS Dr Dalbert Batman consulted for tumor resection and patient [facility]. Patient planned for operative intervention this afternoon and Urology anticipated to be involved for intraoperative cystoscopy and placement of right ureteral stent.  Per records at [facility], it appears that patients renal function remained stable, she however was noted to have hypocalcemia (low even when corrected to albumin) and she received IV/po replacement. Hospitalist service requested to perform urgent pre-op risk eval this morning.  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 Colon adencarcinoma:  -Pt now s/p partial colectetomy 9/25 -CEA is 12.8. -Recommend metastaitic w/u at some point through primary oncologist.  -Her last PET scan was in 1/20.   -Surgical pathology from 9/25 pending  2. Acute blood loss anemia on chronic anemia:  -in the setting of #1. S/P PRBC transfusion x 2 at [facility].  -Hgb stable -Will need close monitoring postoperatively and transfuse FOR <7.   3. CAD:  -Denies chest pain this AM -Stable   4. COPD:  -Stable and without wheezing this AM -Continue with neb rx and pulmonary toilet post operatively.   5. Hypokalemia/Hypocalcemia:  -Mag level wnl.  -Potassium normalized -Corrected calcium of 8.16  6. CKD-IV:  -creatinine baseline around 1.9 and GFR at 28. -Labs reviewed, Cr currently 1.77  7. Transitional cell left Ureteral :  -s/p resection by Dr Alyson Ingles in 04/2018.  -Pt now s/p intraoperative cystoscopy and placement of right ureteral stent by Urology 9/25   8. Right Breast cancer, stage 1:  -Right lumpectomy/radiation in 2017, 0.7 cm IDC, grade 1, ER positive, PR negative, HER-2 negative. -Continued on anastrozole at this time.  -Mammogram on 09/19/2018 was BI-RADS Category 2.  9.  Prediabetes:  -Not on any medications at baseline. HgbA1C in 2/20 was 5.2  10. HTN:  -Continue Amlodipine -BP currently suboptimally controlled -Will add PRN IV labetalol  DVT prophylaxis: Lovenox subQ Code Status: Full Family Communication: Pt in room,family not at  bedside Disposition Plan: Uncertain at this time  Consultants:   General Surgery  Urology  Procedures:   Open laparotomy with  lysis of adhesions, R colectomy, small bowel resection, ileocolic anastomosis, repair of serosal injury sigmoid colon - 9/25  Cystoscopy with R ureteral stent placement - 9/25  Antimicrobials: Anti-infectives (From admission, onward)   Start     Dose/Rate Route Frequency Ordered Stop   12/09/18 0130  cefoTEtan (CEFOTAN) 2 g in sodium chloride 0.9 % 100 mL IVPB     2 g 200 mL/hr over 30 Minutes Intravenous Every 12 hours 12/08/18 1740 12/09/18 0049   12/08/18 0645  cefoTEtan (CEFOTAN) 2 g in sodium chloride 0.9 % 100 mL IVPB     2 g 200 mL/hr over 30 Minutes Intravenous On call to O.R. 12/08/18 3606 12/08/18 1337   12/08/18 0645  clindamycin (CLEOCIN) 900 mg, gentamicin (GARAMYCIN) 240 mg in sodium chloride 0.9 % 1,000 mL for intraperitoneal lavage  Status:  Discontinued      Irrigation To Surgery 12/08/18 0634 12/08/18 1729       Subjective: Without complaints this AM  Objective: Vitals:   12/09/18 0800 12/09/18 0900 12/09/18 1200 12/09/18 1533  BP: (!) 173/107     Pulse: (!) 103 (!) 101    Resp: 16 20    Temp: (!) 97.5 F (36.4 C)  97.9 F (36.6 C) 98.1 F (36.7 C)  TempSrc: Oral  Oral Oral  SpO2: 97% 98%    Weight:      Height:        Intake/Output Summary (Last 24 hours) at 12/09/2018 1550 Last data filed at 12/09/2018 0500 Gross per 24 hour  Intake 1563.33 ml  Output 500 ml  Net 1063.33 ml   Filed Weights   12/06/18 1326 12/08/18 1229 12/09/18 0500  Weight: 62.1 kg 62.1 kg 61.7 kg    Examination:  General exam: Appears calm and comfortable  Respiratory system: Clear to auscultation. Respiratory effort normal. Cardiovascular system: S1 & S2 heard, RRR Gastrointestinal system: Abdomen is nondistended, soft and nontender. No organomegaly or masses felt. Normal bowel sounds heard. Central nervous system: Alert and oriented. No focal neurological deficits. Extremities: Symmetric 5 x 5 power. Skin: No rashes, lesions Psychiatry: Judgement and insight appear  normal. Mood & affect appropriate.   Data Reviewed: I have personally reviewed following labs and imaging studies  CBC: Recent Labs  Lab 12/04/18 1205  12/06/18 0608 12/07/18 0929 12/08/18 0541 12/08/18 1636 12/09/18 0158  WBC 17.3*   < > 13.5* 12.3* 11.1* 16.5* 15.8*  NEUTROABS 15.0*  --   --   --  8.8*  --   --   HGB 5.5*   < > 8.2* 8.2* 8.0* 10.5* 10.3*  HCT 19.3*   < > 26.6* 26.7* 26.6* 35.1* 33.3*  MCV 98.0   < > 93.0 94.0 94.3 95.9 93.8  PLT 510*   < > 386 358 347 355 350   < > = values in this interval not displayed.   Basic Metabolic Panel: Recent Labs  Lab 12/04/18 1406 12/06/18 0608 12/07/18 0929 12/08/18 0541 12/08/18 1636 12/09/18 0158  NA 135 137 137  135 139  --  139  K 4.8 3.8 3.1*  2.9* 4.4 4.7 4.5  CL 104 107 107  105 112*  --  112*  CO2 22 20* 18*  20* 20*  --  18*  GLUCOSE 124* 103* 178*  182* 102*  --  179*  BUN 33* 26* 22  21 16  --  17  CREATININE 1.93* 1.87* 1.88*  1.93* 1.60* 1.76* 1.77*  CALCIUM 8.1* 7.1* 6.6*  6.4* 7.2*  --  7.2*  MG  --   --  2.3  --   --   --   PHOS  --   --  2.3*  --   --   --    GFR: Estimated Creatinine Clearance: 19.2 mL/min (A) (by C-G formula based on SCr of 1.77 mg/dL (H)). Liver Function Tests: Recent Labs  Lab 12/04/18 1205 12/07/18 0929 12/09/18 0158  AST 17  --  26  ALT 12  --  28  ALKPHOS 79  --  79  BILITOT 0.2*  --  0.3  PROT 6.7  --  5.9*  ALBUMIN 3.1* 2.8* 2.8*   Recent Labs  Lab 12/04/18 1205  LIPASE 32   No results for input(s): AMMONIA in the last 168 hours. Coagulation Profile: No results for input(s): INR, PROTIME in the last 168 hours. Cardiac Enzymes: No results for input(s): CKTOTAL, CKMB, CKMBINDEX, TROPONINI in the last 168 hours. BNP (last 3 results) No results for input(s): PROBNP in the last 8760 hours. HbA1C: No results for input(s): HGBA1C in the last 72 hours. CBG: Recent Labs  Lab 12/07/18 0419 12/08/18 0511 12/08/18 1232 12/08/18 1548 12/09/18 0506   GLUCAP 105* 93 180* 127* 161*   Lipid Profile: No results for input(s): CHOL, HDL, LDLCALC, TRIG, CHOLHDL, LDLDIRECT in the last 72 hours. Thyroid Function Tests: No results for input(s): TSH, T4TOTAL, FREET4, T3FREE, THYROIDAB in the last 72 hours. Anemia Panel: No results for input(s): VITAMINB12, FOLATE, FERRITIN, TIBC, IRON, RETICCTPCT in the last 72 hours. Sepsis Labs: No results for input(s): PROCALCITON, LATICACIDVEN in the last 168 hours.  Recent Results (from the past 240 hour(s))  SARS CORONAVIRUS 2 (TAT 6-24 HRS) Nasopharyngeal Nasopharyngeal Swab     Status: None   Collection Time: 12/04/18  4:39 PM   Specimen: Nasopharyngeal Swab  Result Value Ref Range Status   SARS Coronavirus 2 NEGATIVE NEGATIVE Final    Comment: (NOTE) SARS-CoV-2 target nucleic acids are NOT DETECTED. The SARS-CoV-2 RNA is generally detectable in upper and lower respiratory specimens during the acute phase of infection. Negative results do not preclude SARS-CoV-2 infection, do not rule out co-infections with other pathogens, and should not be used as the sole basis for treatment or other patient management decisions. Negative results must be combined with clinical observations, patient history, and epidemiological information. The expected result is Negative. Fact Sheet for Patients: SugarRoll.be Fact Sheet for Healthcare Providers: https://www.woods-mathews.com/ This test is not yet approved or cleared by the [place] FDA and  has been authorized for detection and/or diagnosis of SARS-CoV-2 by FDA under an Emergency Use Authorization (EUA). This EUA will remain  in effect (meaning this test can be used) for the duration of the COVID-19 declaration under Section 56 4(b)(1) of the Act, 21 U.S.C. section 360bbb-3(b)(1), unless the authorization is terminated or revoked sooner. Performed at [facility], [place] [address] [place], [place]  [address]   Surgical pcr screen     Status: None   Collection Time: 12/08/18  6:21 AM   Specimen: Nasal Mucosa; Nasal Swab  Result Value Ref Range Status   MRSA, PCR NEGATIVE NEGATIVE Final   Staphylococcus aureus NEGATIVE NEGATIVE Final    Comment: (NOTE) The Xpert SA Assay (FDA approved for NASAL specimens in patients 44 years of age and older), is one component of a  comprehensive surveillance program. It is not intended to diagnose infection nor to guide or monitor treatment. Performed at [facility], [place] [address], [place], [place] [address]   Urine Culture     Status: Abnormal (Preliminary result)   Collection Time: 12/08/18  2:07 PM   Specimen: Urine, Random  Result Value Ref Range Status   Specimen Description   Final    URINE, RANDOM Performed at [facility], [place] [address], [place], [place] [address]    Special Requests   Final    NONE Performed at [facility], [place] [address] [place], [place] [address]    Culture (A)  Final    >=100,000 COLONIES/mL KLEBSIELLA OXYTOCA SUSCEPTIBILITIES TO FOLLOW Performed at [facility], [place] [address] [place],  [address]    Report Status PENDING  Incomplete     Radiology Studies: Dg C-arm 1-60 Min-no Report  Result Date: 12/08/2018 Fluoroscopy was utilized by the requesting physician.  No radiographic interpretation.    Scheduled Meds: . alvimopan  12 mg Oral BID  . amLODipine  5 mg Oral Daily  . anastrozole  1 mg Oral QPM  . arformoterol  15 mcg Nebulization BID  . chlorhexidine  15 mL Mouth Rinse BID  . Chlorhexidine Gluconate Cloth  6 each Topical Daily  . enoxaparin (LOVENOX) injection  30 mg Subcutaneous Q24H  . feeding supplement  237 mL Oral BID BM  . furosemide  20 mg Oral q morning - 10a  . mouth rinse  15 mL Mouth Rinse q12n4p  . nortriptyline  20-30 mg Oral QHS  . pantoprazole (PROTONIX) IV  40 mg Intravenous Q24H   Continuous Infusions: . lactated  ringers 100 mL/hr at 12/09/18 0419     LOS: 4 days   Marylu Lund, MD [facility] Pager On Amion  If 7PM-7AM, please contact night-coverage 12/09/2018, 3:50 PM

## 2018-12-10 DIAGNOSIS — Z9049 Acquired absence of other specified parts of digestive tract: Secondary | ICD-10-CM

## 2018-12-10 DIAGNOSIS — K922 Gastrointestinal hemorrhage, unspecified: Secondary | ICD-10-CM

## 2018-12-10 LAB — CBC
HCT: 30.7 % — ABNORMAL LOW (ref 36.0–46.0)
Hemoglobin: 9.5 g/dL — ABNORMAL LOW (ref 12.0–15.0)
MCH: 28.3 pg (ref 26.0–34.0)
MCHC: 30.9 g/dL (ref 30.0–36.0)
MCV: 91.4 fL (ref 80.0–100.0)
Platelets: 349 10*3/uL (ref 150–400)
RBC: 3.36 MIL/uL — ABNORMAL LOW (ref 3.87–5.11)
RDW: 16.2 % — ABNORMAL HIGH (ref 11.5–15.5)
WBC: 11.9 10*3/uL — ABNORMAL HIGH (ref 4.0–10.5)
nRBC: 0 % (ref 0.0–0.2)

## 2018-12-10 LAB — BASIC METABOLIC PANEL
Anion gap: 12 (ref 5–15)
BUN: 15 mg/dL (ref 8–23)
CO2: 18 mmol/L — ABNORMAL LOW (ref 22–32)
Calcium: 7.1 mg/dL — ABNORMAL LOW (ref 8.9–10.3)
Chloride: 109 mmol/L (ref 98–111)
Creatinine, Ser: 1.66 mg/dL — ABNORMAL HIGH (ref 0.44–1.00)
GFR calc Af Amer: 32 mL/min — ABNORMAL LOW (ref >60)
GFR calc non Af Amer: 27 mL/min — ABNORMAL LOW (ref >60)
Glucose, Bld: 98 mg/dL (ref 70–99)
Potassium: 3.7 mmol/L (ref 3.5–5.1)
Sodium: 139 mmol/L (ref 135–145)

## 2018-12-10 LAB — URINE CULTURE: Culture: >=100000 — AB

## 2018-12-10 LAB — GLUCOSE, CAPILLARY
Glucose-Capillary: 82 mg/dL (ref 70–99)
Glucose-Capillary: 137 mg/dL — ABNORMAL HIGH (ref 70–99)
Glucose-Capillary: 86 mg/dL (ref 70–99)

## 2018-12-10 MED ORDER — TRAMADOL HCL 50 MG PO TABS: 50.0000 mg | ORAL_TABLET | Freq: Four times a day (QID) | ORAL | Status: DC | PRN

## 2018-12-10 MED ORDER — ALUM & MAG HYDROXIDE-SIMETH 200-200-20 MG/5ML PO SUSP
30.0000 mL | Freq: Four times a day (QID) | ORAL | Status: DC | PRN
  Administered 2018-12-11: 18:00:00 30 mL via ORAL
  Filled 2018-12-10: qty 30

## 2018-12-10 MED ORDER — PSYLLIUM 95 % PO PACK
1.0000 | PACK | Freq: Every day | ORAL | Status: DC
  Administered 2018-12-11: 06:00:00 1 via ORAL
  Filled 2018-12-10 (×3): qty 1

## 2018-12-10 MED ORDER — NAPHAZOLINE-GLYCERIN 0.012-0.2 % OP SOLN: 1.0000 [drp] | Freq: Four times a day (QID) | OPHTHALMIC | Status: DC | PRN

## 2018-12-10 MED ORDER — ACETAMINOPHEN 500 MG PO TABS
1000.0000 mg | ORAL_TABLET | Freq: Three times a day (TID) | ORAL | Status: DC
  Administered 2018-12-10 – 2018-12-15 (×12): 1000 mg via ORAL
  Filled 2018-12-10 (×14): qty 2

## 2018-12-10 MED ORDER — METOPROLOL TARTRATE 12.5 MG HALF TABLET
12.5000 mg | ORAL_TABLET | Freq: Two times a day (BID) | ORAL | Status: DC
  Administered 2018-12-10 – 2018-12-15 (×10): 12.5 mg via ORAL
  Filled 2018-12-10 (×10): qty 1

## 2018-12-10 MED ORDER — SIMETHICONE 40 MG/0.6ML PO SUSP
40.0000 mg | Freq: Four times a day (QID) | ORAL | Status: DC | PRN
  Administered 2018-12-11: 40 mg via ORAL
  Filled 2018-12-10 (×3): qty 0.6

## 2018-12-10 MED ORDER — MAGIC MOUTHWASH
15.0000 mL | Freq: Four times a day (QID) | ORAL | Status: DC | PRN
  Filled 2018-12-10: qty 15

## 2018-12-10 MED ORDER — GABAPENTIN 100 MG PO CAPS
100.0000 mg | ORAL_CAPSULE | Freq: Two times a day (BID) | ORAL | Status: AC
  Administered 2018-12-11 – 2018-12-13 (×5): 100 mg via ORAL
  Filled 2018-12-10 (×6): qty 1

## 2018-12-10 MED ORDER — METHOCARBAMOL 1000 MG/10ML IJ SOLN
1000.0000 mg | Freq: Four times a day (QID) | INTRAVENOUS | Status: DC | PRN
  Filled 2018-12-10: qty 10

## 2018-12-10 MED ORDER — SODIUM CHLORIDE 0.9 % IV SOLN: 250.0000 mL | INTRAVENOUS | Status: DC | PRN

## 2018-12-10 MED ORDER — MENTHOL 3 MG MT LOZG: 1.0000 | LOZENGE | OROMUCOSAL | Status: DC | PRN

## 2018-12-10 MED ORDER — LIP MEDEX EX OINT
1.0000 "application " | TOPICAL_OINTMENT | Freq: Two times a day (BID) | CUTANEOUS | Status: DC
  Administered 2018-12-10 – 2018-12-15 (×9): 1 via TOPICAL
  Filled 2018-12-10 (×2): qty 7

## 2018-12-10 MED ORDER — NORTRIPTYLINE HCL 25 MG PO CAPS
25.0000 mg | ORAL_CAPSULE | Freq: Every day | ORAL | Status: DC
  Administered 2018-12-10: 21:00:00 25 mg via ORAL
  Filled 2018-12-10: qty 1

## 2018-12-10 MED ORDER — SODIUM CHLORIDE 0.9% FLUSH
3.0000 mL | INTRAVENOUS | Status: DC | PRN
  Administered 2018-12-11: 3 mL via INTRAVENOUS
  Filled 2018-12-10: qty 3

## 2018-12-10 MED ORDER — PHENOL 1.4 % MT LIQD: 1.0000 | OROMUCOSAL | Status: DC | PRN

## 2018-12-10 MED ORDER — BISACODYL 10 MG RE SUPP: 10.0000 mg | Freq: Two times a day (BID) | RECTAL | Status: DC | PRN

## 2018-12-10 MED ORDER — HYDROCORTISONE (PERIANAL) 2.5 % EX CREA: 1.0000 "application " | TOPICAL_CREAM | Freq: Four times a day (QID) | CUTANEOUS | Status: DC | PRN

## 2018-12-10 MED ORDER — HYDROCORTISONE 1 % EX CREA: 1.0000 "application " | TOPICAL_CREAM | Freq: Three times a day (TID) | CUTANEOUS | Status: DC | PRN

## 2018-12-10 MED ORDER — SODIUM CHLORIDE 0.9% FLUSH
3.0000 mL | Freq: Two times a day (BID) | INTRAVENOUS | Status: DC
  Administered 2018-12-10 – 2018-12-15 (×8): 3 mL via INTRAVENOUS

## 2018-12-10 MED ORDER — GUAIFENESIN-DM 100-10 MG/5ML PO SYRP: 10.0000 mL | ORAL_SOLUTION | ORAL | Status: DC | PRN

## 2018-12-10 MED ORDER — ALBUMIN HUMAN 5 % IV SOLN
12.5000 g | Freq: Four times a day (QID) | INTRAVENOUS | Status: AC | PRN
  Filled 2018-12-10: qty 250

## 2018-12-10 NOTE — Progress Notes (Signed)
BP= 151/77. Patient has history of HTN. Medication was given as MD ordered. Patient is alert and oriented x4. No pain complained. Dressing abdominal is clean, no bleeding.

## 2018-12-10 NOTE — Progress Notes (Signed)
2 Days Post-Op   Subjective/Chief Complaint: Comfortable Reports minimal pain Passing flatus Denies nausea   Objective: Vital signs in last 24 hours: Temp:  [97.5 F (36.4 C)-98.5 F (36.9 C)] 98.5 F (36.9 C) (09/27 0703) Pulse Rate:  [101-105] 105 (09/27 0740) Resp:  [16-22] 20 (09/27 0740) BP: (153-175)/(72-133) 158/72 (09/27 0623) SpO2:  [96 %-99 %] 98 % (09/27 0740) Weight:  [63.8 kg] 63.8 kg (09/27 0500) Last BM Date: 12/07/18  Intake/Output from previous day: 09/26 0701 - 09/27 0700 In: 1200 [I.V.:1200] Out: 1925 [Urine:1925] Intake/Output this shift: No intake/output data recorded.  Exam: Awake and alert Looks comfortable Lungs clear CV mild tachy Abdomen soft, dressing dry, non-distended  Lab Results:  Recent Labs    12/09/18 0158 12/10/18 0151  WBC 15.8* 11.9*  HGB 10.3* 9.5*  HCT 33.3* 30.7*  PLT 350 349   BMET Recent Labs    12/09/18 0158 12/10/18 0151  NA 139 139  K 4.5 3.7  CL 112* 109  CO2 18* 18*  GLUCOSE 179* 98  BUN 17 15  CREATININE 1.77* 1.66*  CALCIUM 7.2* 7.1*   PT/INR No results for input(s): LABPROT, INR in the last 72 hours. ABG No results for input(s): PHART, HCO3 in the last 72 hours.  Invalid input(s): PCO2, PO2  Studies/Results: Dg C-arm 1-60 Min-no Report  Result Date: 12/08/2018 Fluoroscopy was utilized by the requesting physician.  No radiographic interpretation.    Anti-infectives: Anti-infectives (From admission, onward)   Start     Dose/Rate Route Frequency Ordered Stop   12/09/18 0130  cefoTEtan (CEFOTAN) 2 g in sodium chloride 0.9 % 100 mL IVPB     2 g 200 mL/hr over 30 Minutes Intravenous Every 12 hours 12/08/18 1740 12/09/18 0049   12/08/18 0645  cefoTEtan (CEFOTAN) 2 g in sodium chloride 0.9 % 100 mL IVPB     2 g 200 mL/hr over 30 Minutes Intravenous On call to O.R. 12/08/18 6659 12/08/18 1337   12/08/18 0645  clindamycin (CLEOCIN) 900 mg, gentamicin (GARAMYCIN) 240 mg in sodium chloride 0.9 %  1,000 mL for intraperitoneal lavage  Status:  Discontinued      Irrigation To Surgery 12/08/18 0634 12/08/18 1729      Assessment/Plan: s/p Procedure(s): OPEN SEGMENTAL COLON RESECTION (N/A) CYSTOSCOPY WITH RETROGRADE PYELOGRAM/URETERAL STENT PLACEMENT (Right)  POD#2  Will try clear liquid diet D/c foley   LOS: 5 days    Coralie Keens 12/10/2018

## 2018-12-10 NOTE — Progress Notes (Signed)
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 Alyson Ingles, ? CAD (patient denies), hypertension, COPD/emphysema who has home 02 for PRN use, prediabetes not on medications, CKD -IV with baseline creatinine around 1.9 and GFR 20-24, LVH ( echo 2018 revealed Mild LVH with LVEF 70-75% and grossly normal diastolic function) presented to [facility] with c/o lower abdominal pain, fatigue and noted to have Hgb of 5.5 at PCP office prompting referral to ED. CT abdomen and pelvis was ordered without contrast-suggested adenocarcinoma of the colonwithnodal metastasis. She received 2 units of PRBC at [facility] and admitted to Hospitalist service with GI, [place] and Oncology consultations. She underwent colonoscopy on 9/23 revealed large ulcerated mass at ascending colon, terminal ileum and cecum.--Biopsy/pathology is consistent with adenocarcinoma. CT of the chest planned as outpatient to help adequate staging of her malignancy through her primary oncologist Dr. Delton Coombes in [place]. GS Dr Dalbert Batman consulted for tumor resection and patient transferred to [facility] overnight. Patient planned for operative intervention this afternoon and Urology anticipated to be involved for intraoperative cystoscopy and placement of right ureteral stent.  Per records at [facility], it appears that patients renal function remained stable, she however was noted to have hypocalcemia (low even when corrected to albumin) and she received IV/po replacement. Hospitalist service requested to perform urgent pre-op risk eval this morning.  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 Colon adencarcinoma:  -Pt now s/p partial colectetomy 9/25 -CEA is 12.8. -Recommend metastaitic w/u at some point through primary oncologist.  -Her last PET scan was in 1/20.   -Surgical pathology from 9/25 remains pending -Tolerating clear diet this AM  2. Acute blood loss anemia on chronic anemia:  -in the setting of #1. S/P PRBC transfusion x 2 at [facility].  -Hgb stable -Will need close monitoring postoperatively and transfuse FOR <7.   3. CAD:  -Denies chest pain this AM -Stable   4. COPD:  -Stable and without wheezing this AM -Continue with neb rx and pulmonary toilet post operatively.   5. Hypokalemia/Hypocalcemia:  -Mag level wnl.  -Potassium normalized -Corrected calcium of 8.16  6. CKD-IV:  -creatinine baseline around 1.9 and GFR at 28. -Labs reviewed, Cr currently 1.66  7. Transitional cell left Ureteral :  -s/p resection by Dr Alyson Ingles in 04/2018.  -Pt now s/p intraoperative cystoscopy and placement of right ureteral stent by Urology 9/25   8. Right Breast cancer, stage 1:  -Right lumpectomy/radiation in 2017, 0.7 cm IDC, grade 1, ER positive, PR negative, HER-2 negative. -Continued on anastrozole at this time.  -Mammogram on 09/19/2018 was BI-RADS Category 2.  9.  Prediabetes:  -Not on any medications at baseline. HgbA1C in 2/20 was 5.2  10. HTN:  -Continue Amlodipine as tolerated -BP currently suboptimally controlled -Will continue PRN IV labetalol  DVT prophylaxis: Lovenox subQ Code Status:  Full Family Communication: Pt in room,family not at bedside Disposition Plan: Uncertain at this time  Consultants:   General  Surgery  Urology  Procedures:   Open laparotomy with lysis of adhesions, R colectomy, small bowel resection, ileocolic anastomosis, repair of serosal injury sigmoid colon - 9/25  Cystoscopy with R ureteral stent placement - 9/25  Antimicrobials: Anti-infectives (From admission, onward)   Start     Dose/Rate Route Frequency Ordered Stop   12/09/18 0130  cefoTEtan (CEFOTAN) 2 g in sodium chloride 0.9 % 100 mL IVPB     2 g 200 mL/hr over 30 Minutes Intravenous Every 12 hours 12/08/18 1740 12/09/18 0049   12/08/18 0645  cefoTEtan (CEFOTAN) 2 g in sodium chloride 0.9 % 100 mL IVPB     2 g 200 mL/hr over 30 Minutes Intravenous On call to O.R. 12/08/18 0350 12/08/18 1337   12/08/18 0645  clindamycin (CLEOCIN) 900 mg, gentamicin (GARAMYCIN) 240 mg in sodium chloride 0.9 % 1,000 mL for intraperitoneal lavage  Status:  Discontinued      Irrigation To Surgery 12/08/18 0634 12/08/18 1729      Subjective: No complaints this AM  Objective: Vitals:   12/10/18 0703 12/10/18 0740 12/10/18 1036 12/10/18 1200  BP:   (!) 160/55   Pulse:  (!) 105    Resp:  20    Temp: 98.5 F (36.9 C)   98.2 F (36.8 C)  TempSrc: Oral   Oral  SpO2:  98%    Weight:      Height:        Intake/Output Summary (Last 24 hours) at 12/10/2018 1458 Last data filed at 12/10/2018 0800 Gross per 24 hour  Intake 1200 ml  Output 2175 ml  Net -975 ml   Filed Weights   12/08/18 1229 12/09/18 0500 12/10/18 0500  Weight: 62.1 kg 61.7 kg 63.8 kg    Examination: General exam: Awake, laying in bed, in nad Respiratory system: Normal respiratory effort, no wheezing  Data Reviewed: I have personally reviewed following labs and imaging studies  CBC: Recent Labs  Lab 12/04/18 1205  12/07/18 0929 12/08/18 0541 12/08/18 1636 12/09/18 0158 12/10/18 0151  WBC 17.3*   < > 12.3* 11.1* 16.5* 15.8* 11.9*  NEUTROABS 15.0*  --   --  8.8*  --   --   --   HGB 5.5*   < > 8.2* 8.0* 10.5* 10.3* 9.5*  HCT 19.3*   < > 26.7*  26.6* 35.1* 33.3* 30.7*  MCV 98.0   < > 94.0 94.3 95.9 93.8 91.4  PLT 510*   < > 358 347 355 350 349   < > = values in this interval not displayed.   Basic Metabolic Panel: Recent Labs  Lab 12/06/18 0608 12/07/18 0929 12/08/18 0541 12/08/18 1636 12/09/18 0158 12/10/18 0151  NA 137 137  135 139  --  139 139  K 3.8 3.1*  2.9* 4.4 4.7 4.5 3.7  CL 107 107  105 112*  --  112* 109  CO2 20* 18*  20* 20*  --  18* 18*  GLUCOSE 103* 178*  182* 102*  --  179* 98  BUN 26* '22  21 16  '$ --  17 15  CREATININE 1.87* 1.88*  1.93* 1.60* 1.76* 1.77* 1.66*  CALCIUM 7.1* 6.6*  6.4* 7.2*  --  7.2* 7.1*  MG  --  2.3  --   --   --   --   PHOS  --  2.3*  --   --   --   --  GFR: Estimated Creatinine Clearance: 20.8 mL/min (A) (by C-G formula based on SCr of 1.66 mg/dL (H)). Liver Function Tests: Recent Labs  Lab 12/04/18 1205 12/07/18 0929 12/09/18 0158  AST 17  --  26  ALT 12  --  28  ALKPHOS 79  --  79  BILITOT 0.2*  --  0.3  PROT 6.7  --  5.9*  ALBUMIN 3.1* 2.8* 2.8*   Recent Labs  Lab 12/04/18 1205  LIPASE 32   No results for input(s): AMMONIA in the last 168 hours. Coagulation Profile: No results for input(s): INR, PROTIME in the last 168 hours. Cardiac Enzymes: No results for input(s): CKTOTAL, CKMB, CKMBINDEX, TROPONINI in the last 168 hours. BNP (last 3 results) No results for input(s): PROBNP in the last 8760 hours. HbA1C: No results for input(s): HGBA1C in the last 72 hours. CBG: Recent Labs  Lab 12/08/18 1232 12/08/18 1548 12/09/18 0506 12/10/18 0802 12/10/18 1216  GLUCAP 180* 127* 161* 82 137*   Lipid Profile: No results for input(s): CHOL, HDL, LDLCALC, TRIG, CHOLHDL, LDLDIRECT in the last 72 hours. Thyroid Function Tests: No results for input(s): TSH, T4TOTAL, FREET4, T3FREE, THYROIDAB in the last 72 hours. Anemia Panel: No results for input(s): VITAMINB12, FOLATE, FERRITIN, TIBC, IRON, RETICCTPCT in the last 72 hours. Sepsis Labs: No results for  input(s): PROCALCITON, LATICACIDVEN in the last 168 hours.  Recent Results (from the past 240 hour(s))  SARS CORONAVIRUS 2 (TAT 6-24 HRS) Nasopharyngeal Nasopharyngeal Swab     Status: None   Collection Time: 12/04/18  4:39 PM   Specimen: Nasopharyngeal Swab  Result Value Ref Range Status   SARS Coronavirus 2 NEGATIVE NEGATIVE Final    Comment: (NOTE) SARS-CoV-2 target nucleic acids are NOT DETECTED. The SARS-CoV-2 RNA is generally detectable in upper and lower respiratory specimens during the acute phase of infection. Negative results do not preclude SARS-CoV-2 infection, do not rule out co-infections with other pathogens, and should not be used as the sole basis for treatment or other patient management decisions. Negative results must be combined with clinical observations, patient history, and epidemiological information. The expected result is Negative. Fact Sheet for Patients: SugarRoll.be Fact Sheet for Healthcare Providers: https://www.woods-mathews.com/ This test is not yet approved or cleared by the [place] FDA and  has been authorized for detection and/or diagnosis of SARS-CoV-2 by FDA under an Emergency Use Authorization (EUA). This EUA will remain  in effect (meaning this test can be used) for the duration of the COVID-19 declaration under Section 56 4(b)(1) of the Act, 21 U.S.C. section 360bbb-3(b)(1), unless the authorization is terminated or revoked sooner. Performed at [facility], [place] [address], [place], [place] [address]   Surgical pcr screen     Status: None   Collection Time: 12/08/18  6:21 AM   Specimen: Nasal Mucosa; Nasal Swab  Result Value Ref Range Status   MRSA, PCR NEGATIVE NEGATIVE Final   Staphylococcus aureus NEGATIVE NEGATIVE Final    Comment: (NOTE) The Xpert SA Assay (FDA approved for NASAL specimens in patients 43 years of age and older), is one component of a comprehensive surveillance  program. It is not intended to diagnose infection nor to guide or monitor treatment. Performed at [facility], [place] [address], [place], [place] [address]   Urine Culture     Status: Abnormal   Collection Time: 12/08/18  2:07 PM   Specimen: Urine, Random  Result Value Ref Range Status   Specimen Description   Final  URINE, RANDOM Performed at [facility], [place] [address] [place], [place] [address]    Special Requests   Final    NONE Performed at [facility], [place] [address], [place], [place] [address]    Culture >=100,000 COLONIES/mL KLEBSIELLA OXYTOCA (A)  Final   Report Status 12/10/2018 FINAL  Final   Organism ID, Bacteria KLEBSIELLA OXYTOCA (A)  Final      Susceptibility   Klebsiella oxytoca - MIC*    AMPICILLIN RESISTANT Resistant     CEFAZOLIN >=64 RESISTANT Resistant     CEFTRIAXONE <=1 SENSITIVE Sensitive     CIPROFLOXACIN <=0.25 SENSITIVE Sensitive     GENTAMICIN <=1 SENSITIVE Sensitive     IMIPENEM <=0.25 SENSITIVE Sensitive     NITROFURANTOIN 32 SENSITIVE Sensitive     TRIMETH/SULFA <=20 SENSITIVE Sensitive     AMPICILLIN/SULBACTAM 4 SENSITIVE Sensitive     PIP/TAZO <=4 SENSITIVE Sensitive     Extended ESBL NEGATIVE Sensitive     * >=100,000 COLONIES/mL KLEBSIELLA OXYTOCA     Radiology Studies: No results found.  Scheduled Meds: . alvimopan  12 mg Oral BID  . amLODipine  5 mg Oral Daily  . anastrozole  1 mg Oral QPM  . arformoterol  15 mcg Nebulization BID  . chlorhexidine  15 mL Mouth Rinse BID  . Chlorhexidine Gluconate Cloth  6 each Topical Daily  . enoxaparin (LOVENOX) injection  30 mg Subcutaneous Q24H  . feeding supplement  237 mL Oral BID BM  . furosemide  20 mg Oral q morning - 10a  . mouth rinse  15 mL Mouth Rinse q12n4p  . nortriptyline  20-30 mg Oral QHS  . pantoprazole (PROTONIX) IV  40 mg Intravenous Q24H   Continuous Infusions: . lactated ringers 100 mL/hr at 12/10/18 0000     LOS: 5 days    Marylu Lund, MD [facility] Pager On Amion  If 7PM-7AM, please contact night-coverage 12/10/2018, 2:58 PM

## 2018-12-10 NOTE — Anesthesia Postprocedure Evaluation (Signed)
Anesthesia Post Note  Patient: [name]  Procedure(s) Performed: OPEN SEGMENTAL COLON RESECTION (N/A Abdomen) CYSTOSCOPY WITH RETROGRADE PYELOGRAM/URETERAL STENT PLACEMENT (Right )     Patient location during evaluation: PACU Anesthesia Type: General Level of consciousness: awake and alert Pain management: pain level controlled Vital Signs Assessment: post-procedure vital signs reviewed and stable Respiratory status: spontaneous breathing, nonlabored ventilation, respiratory function stable and patient connected to nasal cannula oxygen Cardiovascular status: blood pressure returned to baseline and stable Postop Assessment: no apparent nausea or vomiting Anesthetic complications: no    Last Vitals:  Vitals:   12/10/18 0703 12/10/18 0740  BP:    Pulse:  (!) 105  Resp:  20  Temp: 36.9 C   SpO2:  98%    Last Pain:  Vitals:   12/10/18 0703  TempSrc: Oral  PainSc:                  [name]

## 2018-12-11 ENCOUNTER — Encounter (HOSPITAL_COMMUNITY): Payer: Self-pay | Admitting: General Surgery

## 2018-12-11 DIAGNOSIS — C180 Malignant neoplasm of cecum: Secondary | ICD-10-CM

## 2018-12-11 LAB — BASIC METABOLIC PANEL
Anion gap: 13 (ref 5–15)
BUN: 14 mg/dL (ref 8–23)
CO2: 17 mmol/L — ABNORMAL LOW (ref 22–32)
Calcium: 7 mg/dL — ABNORMAL LOW (ref 8.9–10.3)
Chloride: 110 mmol/L (ref 98–111)
Creatinine, Ser: 1.42 mg/dL — ABNORMAL HIGH (ref 0.44–1.00)
GFR calc Af Amer: 38 mL/min — ABNORMAL LOW (ref >60)
GFR calc non Af Amer: 33 mL/min — ABNORMAL LOW (ref >60)
Glucose, Bld: 83 mg/dL (ref 70–99)
Potassium: 3.3 mmol/L — ABNORMAL LOW (ref 3.5–5.1)
Sodium: 140 mmol/L (ref 135–145)

## 2018-12-11 LAB — GLUCOSE, CAPILLARY: Glucose-Capillary: 98 mg/dL (ref 70–99)

## 2018-12-11 MED ORDER — POTASSIUM CHLORIDE 10 MEQ/100ML IV SOLN
10.0000 meq | INTRAVENOUS | Status: DC
  Administered 2018-12-11 (×2): 10 meq via INTRAVENOUS
  Filled 2018-12-11 (×3): qty 100

## 2018-12-11 MED ORDER — CALCIUM CARBONATE 1250 (500 CA) MG PO TABS
1.0000 | ORAL_TABLET | Freq: Every day | ORAL | Status: DC
  Administered 2018-12-11 – 2018-12-15 (×5): 500 mg via ORAL
  Filled 2018-12-11 (×5): qty 1

## 2018-12-11 MED ORDER — BISACODYL 10 MG RE SUPP
10.0000 mg | Freq: Once | RECTAL | Status: AC
  Administered 2018-12-11: 09:00:00 10 mg via RECTAL
  Filled 2018-12-11: qty 1

## 2018-12-11 MED ORDER — POTASSIUM CHLORIDE CRYS ER 20 MEQ PO TBCR
40.0000 meq | EXTENDED_RELEASE_TABLET | Freq: Once | ORAL | Status: AC
  Administered 2018-12-11: 12:00:00 40 meq via ORAL
  Filled 2018-12-11: qty 2

## 2018-12-11 MED ORDER — POTASSIUM CHLORIDE 2 MEQ/ML IV SOLN
INTRAVENOUS | Status: DC
  Filled 2018-12-11: qty 1000

## 2018-12-11 NOTE — Progress Notes (Addendum)
3 Days Post-Op  Subjective:  Alert.  Oriented.  Says she does not have any pain. Denies dyspnea.  States she is breathing as good or better than at home. Afebrile heart rate 96.  SPO2 97% on room air.  BP 111/84  Tolerating clear liquids without nausea. No stool or flatus, however Foley out and voiding with external drainage device Has been up to a chair but has not ambulated  Morning labs pending  Objective: Vital signs in last 24 hours: Temp:  [97.7 F (36.5 C)-98.5 F (36.9 C)] 97.7 F (36.5 C) (09/28 0533) Pulse Rate:  [96-109] 96 (09/28 0533) Resp:  [18-21] 19 (09/28 0533) BP: (111-165)/(55-84) 111/84 (09/28 0533) SpO2:  [95 %-98 %] 97 % (09/28 0533) Weight:  [62.1 kg] 62.1 kg (09/28 0533) Last BM Date: 12/07/18  Intake/Output from previous day: 09/27 0701 - 09/28 0700 In: 1200 [I.V.:1200] Out: 900 [Urine:900] Intake/Output this shift: Total I/O In: 0  Out: 650 [Urine:650]  EXAM: General appearance: Alert.  No apparent distress.  Mental status normal.  Animated and upbeat.  Drinking coffee Resp: clear to auscultation bilaterally GI: Abdomen is soft but a little distended.  Rare bowel sounds.  Midline wound clean.  Telfa wicks removed and wound redressed.  Lab Results:  Results for orders placed or performed during the hospital encounter of 12/04/18 (from the past 24 hour(s))  Glucose, capillary     Status: None   Collection Time: 12/10/18  8:02 AM  Result Value Ref Range   Glucose-Capillary 82 70 - 99 mg/dL  Glucose, capillary     Status: Abnormal   Collection Time: 12/10/18 12:16 PM  Result Value Ref Range   Glucose-Capillary 137 (H) 70 - 99 mg/dL  Glucose, capillary     Status: None   Collection Time: 12/10/18  3:42 PM  Result Value Ref Range   Glucose-Capillary 86 70 - 99 mg/dL     Studies/Results: No results found.  Marland Kitchen acetaminophen  1,000 mg Oral TID  . alvimopan  12 mg Oral BID  . amLODipine  5 mg Oral Daily  . anastrozole  1 mg Oral QPM  .  arformoterol  15 mcg Nebulization BID  . chlorhexidine  15 mL Mouth Rinse BID  . Chlorhexidine Gluconate Cloth  6 each Topical Daily  . enoxaparin (LOVENOX) injection  30 mg Subcutaneous Q24H  . feeding supplement  237 mL Oral BID BM  . furosemide  20 mg Oral q morning - 10a  . gabapentin  100 mg Oral BID  . lip balm  1 application Topical BID  . mouth rinse  15 mL Mouth Rinse q12n4p  . metoprolol tartrate  12.5 mg Oral BID  . nortriptyline  25 mg Oral QHS  . psyllium  1 packet Oral Daily  . sodium chloride flush  3 mL Intravenous Q12H     Assessment/Plan: s/p Procedure(s): OPEN SEGMENTAL COLON RESECTION CYSTOSCOPY WITH RETROGRADE PYELOGRAM/URETERAL STENT PLACEMENT  Locally advanced adenocarcinoma of the cecum and  terminal ileum.   -POD #3.  Laparotomy, right colon and distal ileum resection with primary anastomosis, repair serosal injury sigmoid colon, placement right ureteral stent, now removed. -Stable.  Await resolution of expected ileus.  Liquid diet only.  Do not advance. Entereg -PT and OT consult -DVT prophylaxis with Lovenox- -await pathology -Outpatient follow-up with medical oncology.(Dr. Delton Coombes)  Considering advanced age it is likely that risk would outweigh benefit from adjuvant therapies.  Anemia requiring transfusion.  This is acute blood loss anemia superimposed on  chronic anemia. -Hemoglobin 9.5 yesterday.  No indication for transfusion  COPD.  Moderately severe but seems stable.  Followed by Dr. Lake Bells as outpatient. -Currently seems stable on nebulizers. -Appreciate internal medicine management  Right breast cancer.  Status post right lumpectomy May 23, 2015 for stage I a invasive ductal carcinoma, receptor positive, HER-2 negative.  She completed radiation therapy and currently tolerating anastrozole with no evidence of disease  Urothelial carcinoma left ureter, status post robotic resection distal left ureter with reimplantation February 2020 no  evidence of hydronephrosis on either side  Lung nodules  Hypertension  CKD 4 -Creatinine stable  Hypokalemia - K+ 3.3 this am.  Might contribute to ileus.  Will adjust IV's, give KCl runs,, labs tomorrow  DM2  Disposition. -She lives alone and is independent.  She has no children.  She has cousins in the area.  She is strongly motivated to go home to her house. -I told her that we would make decisions with her depending on her ability to perform ADLs and what her fall risk was following assessment -[facility] ACO case management will likely get involved   '@PROBHOSP'$ @  LOS: 6 days    Adin Hector 12/11/2018  . .prob

## 2018-12-11 NOTE — Progress Notes (Signed)
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 Alyson Ingles, ? CAD (patient denies), hypertension, COPD/emphysema who has home 02 for PRN use, prediabetes not on medications, CKD -IV with baseline creatinine around 1.9 and GFR 20-24, LVH ( echo 2018 revealed Mild LVH with LVEF 70-75% and grossly normal diastolic function) presented to [facility] with c/o lower abdominal pain, fatigue and noted to have Hgb of 5.5 at PCP office prompting referral to ED. CT abdomen and pelvis was ordered without contrast-suggested adenocarcinoma of the colonwithnodal metastasis. She received 2 units of PRBC at [facility] and admitted to Hospitalist service with GI, [place] and Oncology consultations. She underwent colonoscopy on 9/23 revealed large ulcerated mass at ascending colon, terminal ileum and cecum.--Biopsy/pathology is consistent with adenocarcinoma. CT of the chest planned as outpatient to help adequate staging of her malignancy through her primary oncologist Dr. Delton Coombes in [place]. GS Dr Dalbert Batman consulted for tumor resection and patient transferred to [facility] overnight. Patient planned for operative intervention this afternoon and Urology anticipated to be involved for intraoperative cystoscopy and placement of right ureteral stent.  Per records at [facility], it appears that patients renal function remained stable, she however was noted to have hypocalcemia (low even when corrected to albumin) and she received IV/po replacement. Hospitalist service requested to perform urgent pre-op risk eval this morning.  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 Colon adencarcinoma:  -Pt now s/p partial colectetomy 9/25 -CEA is 12.8. -Recommend metastaitic w/u at some point through primary oncologist.  -Her last PET scan was in 1/20.   -Surgical pathology from 9/25 confirms adenocarcinoma, moderately differentiated -This AM, pt with post-op ileus, distended abd and decreased BS -Recommendation for maintaining liquid only diet for now and to aggressively correct electrolytes as tolerated -PT/OT as tolerated  2. Acute blood loss anemia on chronic anemia:  -in the setting of #1. S/P PRBC transfusion x 2 at [facility].  -Hgb stable -Will need close monitoring postoperatively and transfuse FOR <7.  -Stable at this time  3. CAD:  -Denies chest pain this AM -Remains stable  4. COPD:  -Stable and without wheezing today -Continue with neb rx and pulmonary toilet post operatively.   5. Hypokalemia/Hypocalcemia:  -Mag level wnl.  -Potassium 3.3 today, will correct  6. CKD-IV:  -creatinine baseline around 1.9 and GFR at 28. -Labs reviewed, Cr currently 1.42  7. Transitional cell left Ureteral :  -s/p resection by Dr Alyson Ingles in 04/2018.  -Pt now s/p intraoperative cystoscopy and placement of right ureteral stent by Urology 9/25  -Seems stable at this time  8. Right Breast cancer, stage 1:  -Right lumpectomy/radiation in 2017, 0.7 cm IDC, grade 1, ER positive, PR negative, HER-2 negative. -Continued on anastrozole at this time.  -Mammogram on 09/19/2018 was BI-RADS Category 2.  9.  Prediabetes:  -Not on any medications at baseline. HgbA1C in 2/20 was 5.2  10. HTN:  -Continue Amlodipine as tolerated  -BP currently suboptimally controlled -Continued on PRN IV labetalol  DVT prophylaxis: Lovenox subQ Code Status: Full Family Communication: Pt in room,family not at bedside Disposition Plan: Uncertain at this time  Consultants:   General Surgery  Urology  Procedures:   Open laparotomy with lysis of adhesions, R colectomy, small bowel resection, ileocolic anastomosis, repair of serosal injury sigmoid colon - 9/25  Cystoscopy with R ureteral stent placement - 9/25  Antimicrobials: Anti-infectives (From admission, onward)   Start     Dose/Rate Route Frequency Ordered Stop   12/09/18 0130  cefoTEtan (CEFOTAN) 2 g in sodium chloride 0.9 % 100 mL IVPB     2 g 200 mL/hr over 30 Minutes Intravenous Every 12 hours 12/08/18 1740 12/09/18 0049   12/08/18 0645  cefoTEtan (CEFOTAN) 2 g in sodium chloride 0.9 % 100 mL IVPB     2 g 200 mL/hr over 30 Minutes Intravenous On call to O.R. 12/08/18 8315 12/08/18 1337   12/08/18 0645  clindamycin (CLEOCIN) 900 mg, gentamicin (GARAMYCIN) 240 mg in sodium chloride 0.9 % 1,000 mL for intraperitoneal lavage  Status:  Discontinued      Irrigation To Surgery 12/08/18 0634 12/08/18 1729      Subjective: No flatus or BM. Reports abd is more distended  Objective: Vitals:   12/10/18 2043 12/11/18 0533 12/11/18 0720 12/11/18 1343  BP: 128/67 111/84  (!) 146/70  Pulse: (!) 104 96  87  Resp: '18 19  18  '$ Temp: 98 F (36.7 C) 97.7 F (36.5 C)  98.2 F (36.8 C)  TempSrc: Oral Oral  Oral  SpO2: 98% 97% 97% 100%  Weight:  62.1 kg    Height:        Intake/Output Summary (Last 24 hours) at 12/11/2018 1854 Last data filed at 12/11/2018 1800 Gross per 24 hour  Intake 480 ml  Output 650 ml  Net -170 ml   Filed Weights   12/09/18 0500 12/10/18 0500 12/11/18 0533  Weight: 61.7 kg 63.8 kg 62.1 kg    Examination: General exam: Conversant, in no acute distress Respiratory system: normal chest rise, clear, no audible wheezing Cardiovascular system:  regular rhythm, s1-s2 Gastrointestinal system: distended, decreased BS, tympanic Central nervous system: No seizures, no tremors Extremities: No cyanosis, no joint deformities Skin: No rashes, no pallor Psychiatry: Affect normal // no auditory hallucinations   Data Reviewed: I have personally reviewed following labs and imaging studies  CBC: Recent Labs  Lab 12/07/18 0929 12/08/18 0541 12/08/18 1636 12/09/18 0158 12/10/18 0151  WBC 12.3* 11.1* 16.5* 15.8* 11.9*  NEUTROABS  --  8.8*  --   --   --   HGB 8.2* 8.0* 10.5* 10.3* 9.5*  HCT 26.7* 26.6* 35.1* 33.3* 30.7*  MCV 94.0 94.3 95.9 93.8 91.4  PLT 358 347 355 350 176   Basic Metabolic Panel: Recent Labs  Lab 12/07/18 0929 12/08/18 0541 12/08/18 1636 12/09/18 0158 12/10/18 0151 12/11/18 0556  NA 137  135 139  --  139 139 140  K 3.1*  2.9* 4.4 4.7 4.5 3.7 3.3*  CL 107  105 112*  --  112* 109 110  CO2 18*  20* 20*  --  18* 18* 17*  GLUCOSE 178*  182* 102*  --  179* 98 83  BUN '22  21 16  '$ --  '17 15 14  '$ CREATININE 1.88*  1.93*  1.60* 1.76* 1.77* 1.66* 1.42*  CALCIUM 6.6*  6.4* 7.2*  --  7.2* 7.1* 7.0*  MG 2.3  --   --   --   --   --   PHOS 2.3*  --   --   --   --   --    GFR: Estimated Creatinine Clearance: 24 mL/min (A) (by C-G formula based on SCr of 1.42 mg/dL (H)). Liver Function Tests: Recent Labs  Lab 12/07/18 0929 12/09/18 0158  AST  --  26  ALT  --  28  ALKPHOS  --  79  BILITOT  --  0.3  PROT  --  5.9*  ALBUMIN 2.8* 2.8*   No results for input(s): LIPASE, AMYLASE in the last 168 hours. No results for input(s): AMMONIA in the last 168 hours. Coagulation Profile: No results for input(s): INR, PROTIME in the last 168 hours. Cardiac Enzymes: No results for input(s): CKTOTAL, CKMB, CKMBINDEX, TROPONINI in the last 168 hours. BNP (last 3 results) No results for input(s): PROBNP in the last 8760 hours. HbA1C: No results for input(s): HGBA1C in the last 72 hours. CBG: Recent Labs  Lab 12/09/18  0506 12/10/18 0802 12/10/18 1216 12/10/18 1542 12/11/18 0750  GLUCAP 161* 82 137* 86 98   Lipid Profile: No results for input(s): CHOL, HDL, LDLCALC, TRIG, CHOLHDL, LDLDIRECT in the last 72 hours. Thyroid Function Tests: No results for input(s): TSH, T4TOTAL, FREET4, T3FREE, THYROIDAB in the last 72 hours. Anemia Panel: No results for input(s): VITAMINB12, FOLATE, FERRITIN, TIBC, IRON, RETICCTPCT in the last 72 hours. Sepsis Labs: No results for input(s): PROCALCITON, LATICACIDVEN in the last 168 hours.  Recent Results (from the past 240 hour(s))  SARS CORONAVIRUS 2 (TAT 6-24 HRS) Nasopharyngeal Nasopharyngeal Swab     Status: None   Collection Time: 12/04/18  4:39 PM   Specimen: Nasopharyngeal Swab  Result Value Ref Range Status   SARS Coronavirus 2 NEGATIVE NEGATIVE Final    Comment: (NOTE) SARS-CoV-2 target nucleic acids are NOT DETECTED. The SARS-CoV-2 RNA is generally detectable in upper and lower respiratory specimens during the acute phase of infection. Negative results do not preclude SARS-CoV-2 infection, do not rule out co-infections with other pathogens, and should not be used as the sole basis for treatment or other patient management decisions. Negative results must be combined with clinical observations, patient history, and epidemiological information. The expected result is Negative. Fact Sheet for Patients: SugarRoll.be Fact Sheet for Healthcare Providers: https://www.woods-mathews.com/ This test is not yet approved or cleared by the [place] FDA and  has been authorized for detection and/or diagnosis of SARS-CoV-2 by FDA under an Emergency Use Authorization (EUA). This EUA will remain  in effect (meaning this test can be used) for the duration of the COVID-19 declaration under Section 56 4(b)(1) of the Act, 21 U.S.C. section 360bbb-3(b)(1), unless the authorization is terminated or revoked sooner. Performed at  [facility], [place] [address], [place], [place] [address]   Surgical pcr screen     Status: None   Collection Time: 12/08/18  6:21 AM   Specimen: Nasal Mucosa; Nasal Swab  Result Value Ref Range Status   MRSA, PCR NEGATIVE NEGATIVE Final   Staphylococcus aureus NEGATIVE NEGATIVE Final    Comment: (NOTE) The Xpert SA Assay (FDA approved for NASAL specimens in patients 72 years of age and older), is one component of a comprehensive surveillance program. It is not intended to diagnose infection nor to guide or monitor treatment. Performed at [facility]  [place] [address] [place], [place] [address]   Urine Culture     Status: Abnormal   Collection Time: 12/08/18  2:07 PM   Specimen: Urine, Random  Result Value Ref Range Status   Specimen Description   Final    URINE, RANDOM Performed at [facility], [place] [address] [name], [place] [address]    Special Requests   Final    NONE Performed at [facility], [place] [address] [place], [place] [address]    Culture >=100,000 COLONIES/mL KLEBSIELLA OXYTOCA (A)  Final   Report Status 12/10/2018 FINAL  Final   Organism ID, Bacteria KLEBSIELLA OXYTOCA (A)  Final      Susceptibility   Klebsiella oxytoca - MIC*    AMPICILLIN RESISTANT Resistant     CEFAZOLIN >=64 RESISTANT Resistant     CEFTRIAXONE <=1 SENSITIVE Sensitive     CIPROFLOXACIN <=0.25 SENSITIVE Sensitive     GENTAMICIN <=1 SENSITIVE Sensitive     IMIPENEM <=0.25 SENSITIVE Sensitive     NITROFURANTOIN 32 SENSITIVE Sensitive     TRIMETH/SULFA <=20 SENSITIVE Sensitive     AMPICILLIN/SULBACTAM 4 SENSITIVE Sensitive     PIP/TAZO <=4 SENSITIVE Sensitive     Extended ESBL NEGATIVE Sensitive     * >=100,000 COLONIES/mL KLEBSIELLA OXYTOCA     Radiology Studies: No results found.  Scheduled Meds: . acetaminophen  1,000 mg Oral TID  . alvimopan  12 mg Oral BID  . amLODipine  5 mg Oral Daily  . arformoterol  15 mcg Nebulization BID   . calcium carbonate  1 tablet Oral Q breakfast  . chlorhexidine  15 mL Mouth Rinse BID  . enoxaparin (LOVENOX) injection  30 mg Subcutaneous Q24H  . feeding supplement  237 mL Oral BID BM  . furosemide  20 mg Oral q morning - 10a  . gabapentin  100 mg Oral BID  . lip balm  1 application Topical BID  . mouth rinse  15 mL Mouth Rinse q12n4p  . metoprolol tartrate  12.5 mg Oral BID  . sodium chloride flush  3 mL Intravenous Q12H   Continuous Infusions: . sodium chloride    . albumin human    . methocarbamol (ROBAXIN) IV       LOS: 6 days   Marylu Lund, MD [facility] Pager On Amion  If 7PM-7AM, please contact night-coverage 12/11/2018, 6:54 PM

## 2018-12-11 NOTE — Evaluation (Signed)
Occupational Therapy Evaluation Patient Details Name: [name] MRN: [id number] DOB: 07-26-1928 Today's Date: 12/11/2018    History of Present Illness 85 y.o. 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 Alyson Ingles, ? CAD (patient denies), hypertension, COPD/emphysema who has home 02 for PRN use, prediabetes not on medications, CKD -IV with baseline creatinine around 1.9 and GFR 20-24, LVH ( echo 2018 revealed Mild LVH with LVEF 70-75% and grossly normal diastolic function) presented to [facility] with c/o lower abdominal pain, fatigue and noted to have Hgb of 5.5 at PCP office prompting referral to ED. CT abdomen and pelvis was ordered without contrast-suggested adenocarcinoma of the colon with nodal metastasis. She received 2 units of PRBC at [facility] and admitted to Hospitalist service with GI, [place] and Oncology consultations. She underwent colonoscopy on 9/23 revealed large ulcerated mass at ascending colon, terminal ileum and cecum.--Biopsy/pathology is consistent with adenocarcinoma. Pt s/p Laparotomy, right colon and distal ileum resection    Clinical Impression   Pt s/pLaparotomy, right colon and distal ileum resection   Pt currently with functional limitations due to the deficits listed below (see OT Problem List).  Pt will benefit from skilled OT to increase their safety and independence with ADL and functional mobility for ADL to facilitate discharge to venue listed below. Pt recognizes she will need A with ADL activity and going home with not A is not ideal. Pt wants to go to [facility] for rehab.     Follow Up Recommendations  SNF    Equipment Recommendations  None recommended by OT    Recommendations for Other Services       Precautions / Restrictions Precautions Precautions: Fall      Mobility Bed Mobility Overal  bed mobility: Needs Assistance Bed Mobility: Sidelying to Sit   Sidelying to sit: Mod assist          Transfers Overall transfer level: Needs assistance   Transfers: Sit to/from Stand;Stand Pivot Transfers Sit to Stand: Mod assist Stand pivot transfers: Mod assist       General transfer comment: VC for sequencing and hand placement        ADL either performed or assessed with clinical judgement   ADL Overall ADL's : Needs assistance/impaired Eating/Feeding: Set up;Sitting   Grooming: Set up;Sitting   Upper Body Bathing: Set up;Sitting   Lower Body Bathing: Maximal assistance;Sit to/from stand;Cueing for safety;Cueing for compensatory techniques;Cueing for sequencing       Lower Body Dressing: Set up;Cueing for sequencing;Cueing for compensatory techniques;Cueing for safety;Sit to/from stand   Toilet Transfer: Moderate assistance;Stand-pivot;Cueing for safety;Cueing for sequencing Toilet Transfer Details (indicate cue type and reason): bed to chair Toileting- Clothing Manipulation and Hygiene: Moderate assistance;Sit to/from stand;Cueing for safety;Cueing for sequencing         General ADL Comments: pt lives alone.  Pt very motivated but will need A with ADL activity. Pt is agreeable to SNF and interested inthe [facility]     Vision Patient Visual Report: No change from baseline              Pertinent Vitals/Pain Pain Assessment: 0-10 Pain Score: 4  Pain Location: abdomen Pain Descriptors / Indicators: Guarding;Grimacing Pain Intervention(s): Monitored during session;Repositioned     Hand Dominance     Extremity/Trunk Assessment Upper Extremity Assessment Upper Extremity Assessment: Generalized weakness  Communication Communication Communication: No difficulties   Cognition Arousal/Alertness: Awake/alert Behavior During Therapy: WFL for tasks assessed/performed Overall Cognitive Status: Within Functional Limits for tasks assessed                                                 Home Living Family/patient expects to be discharged to:: Private residence Living Arrangements: Alone Available Help at Discharge: Friend(s) Type of Home: House Home Access: Stairs to enter [employer] of Steps: [place]: One level     Bathroom Shower/Tub: Teacher, early years/pre: Handicapped height     [place]: [place] - single point          Prior Functioning/Environment Level of Independence: Independent with assistive device(s)        Comments: uses SPC, drives        OT Problem List: Decreased strength;Decreased activity tolerance;Impaired balance (sitting and/or standing)      OT Treatment/Interventions: Self-care/ADL training;Patient/family education;DME and/or AE instruction    OT Goals(Current goals can be found in the care plan section) Acute Rehab OT Goals Patient Stated Goal: get well then go home OT Goal Formulation: With patient Time For Goal Achievement: 12/11/18  OT Frequency: Min 2X/week   Barriers to D/C: Decreased caregiver support             AM-PAC OT "6 Clicks" Daily Activity     Outcome Measure Help from another person eating meals?: None Help from another person taking care of personal grooming?: A Little Help from another person toileting, which includes using toliet, bedpan, or urinal?: A Lot Help from another person bathing (including washing, rinsing, drying)?: A Lot Help from another person to put on and taking off regular upper body clothing?: A Little Help from another person to put on and taking off regular lower body clothing?: A Lot 6 Click Score: 16   End of Session Equipment Utilized During Treatment: Rolling walker Nurse Communication: Mobility status  Activity Tolerance: Patient tolerated treatment well Patient left: in chair;with call bell/phone within reach;with chair alarm set  OT Visit Diagnosis: Unsteadiness on  feet (R26.81);History of falling (Z91.81);Muscle weakness (generalized) (M62.81);Other abnormalities of gait and mobility (R26.89)                Time: 1346-1406 OT Time Calculation (min): 20 min Charges:  OT General Charges $OT Visit: 1 Visit OT Evaluation $OT Eval Moderate Complexity: 1 Mod  Kari Baars, [place] [phone] Office- [phone], Edwena Felty D 12/11/2018, 3:32 PM

## 2018-12-11 NOTE — Plan of Care (Signed)

## 2018-12-11 NOTE — Evaluation (Signed)
Physical Therapy Evaluation Patient Details Name: [name] MRN: [id number] DOB: August 19, 1928 Today's Date: 12/11/2018   History of Present Illness  85 yo female admitted to ED on 9/21 with anemia, colonic adenocarcinoma. Pt with history of urothelial carcinoma with lap nephroureterectomy2/2020 and now s/p cystoscopy and R ureteral stent placement 9/25; open laparoscopy, lysis of adhesions, R colectomy, SB resection, ileocolic anastomosis, repair of serosal injury of sigmoid colon on 9/25. PMH includes breast cancer, COPD, DM, HTN, OA, osteoporosis, peripheral neuropathy.  Clinical Impression   Pt presents with abdominal pain and bloating, difficulty performing bed mobility post-operatively, increased time and effort to mobilize, unsteadiness in standing, and decreased activity tolerance. Pt to benefit from acute PT to address deficits. Pt ambulated hallway distance with RW, requiring one standing rest break for dyspnea and fatigue. PT recommending d/c SNF to address deficits post-acutely, and return pt to PLOF. PT to progress mobility as tolerated, and will continue to follow acutely.      Follow Up Recommendations SNF;Supervision for mobility/OOB    Equipment Recommendations  Other (comment)(defer)    Recommendations for Other Services       Precautions / Restrictions Precautions Precautions: Fall Precaution Comments: abdominal Restrictions Weight Bearing Restrictions: No      Mobility  Bed Mobility Overal bed mobility: Needs Assistance Bed Mobility: Sidelying to Sit;Rolling;Sit to Sidelying Rolling: Min assist Sidelying to sit: Mod assist     Sit to sidelying: Mod assist General bed mobility comments: Min assist for rolling to R for completion of roll onto side. Mod assist for sidelying to and from sit for trunk and LE management. Pt educated on log roll technique for abdominal protection and comfort.  Transfers Overall transfer level: Needs assistance Equipment  used: Rolling walker (2 wheeled) Transfers: Sit to/from Stand Sit to Stand: Min assist Stand pivot transfers: Mod assist       General transfer comment: Min assist for power up, steadying, verbal cuing for hand placement when rising.  Ambulation/Gait Ambulation/Gait assistance: Min assist Gait Distance (Feet): 120 Feet Assistive device: Rolling walker (2 wheeled) Gait Pattern/deviations: Step-through pattern;Decreased stride length;Trunk flexed Gait velocity: decr   General Gait Details: Min assist for steadying, frequent verbal reinforcements for placement in RW. Pt with standing rest break at 70 ft for dyspnea 3/4, SpO2 96% on RA and HR <100 bpm.  Stairs            Wheelchair Mobility    Modified Rankin (Stroke Patients Only)       Balance Overall balance assessment: Needs assistance Sitting-balance support: No upper extremity supported Sitting balance-Leahy Scale: Good     Standing balance support: Bilateral upper extremity supported Standing balance-Leahy Scale: Poor Standing balance comment: reliant on RW for support in stanidng                             Pertinent Vitals/Pain Pain Assessment: 0-10 Pain Score: 5  Pain Location: abdomen Pain Descriptors / Indicators: Discomfort Pain Intervention(s): Limited activity within patient's tolerance;Monitored during session;Repositioned    Home Living Family/patient expects to be discharged to:: Private residence Living Arrangements: Alone Available Help at Discharge: Friend(s) Type of Home: House Home Access: Stairs to enter   Secretary/administrator of Steps: 5 Home Layout: One level Home Equipment: Cane - single point      Prior Function Level of Independence: Independent with assistive device(s)         Comments: uses SPC, drives  Hand Dominance   Dominant Hand: Right    Extremity/Trunk Assessment   Upper Extremity Assessment Upper Extremity Assessment: Generalized  weakness    Lower Extremity Assessment Lower Extremity Assessment: Generalized weakness    Cervical / Trunk Assessment Cervical / Trunk Assessment: Normal  Communication   Communication: No difficulties  Cognition Arousal/Alertness: Awake/alert Behavior During Therapy: WFL for tasks assessed/performed Overall Cognitive Status: Within Functional Limits for tasks assessed                                        General Comments      Exercises     Assessment/Plan    PT Assessment Patient needs continued PT services  PT Problem List Decreased strength;Decreased mobility;Decreased activity tolerance;Decreased balance;Decreased knowledge of use of DME;Pain;Decreased knowledge of precautions       PT Treatment Interventions DME instruction;Therapeutic activities;Gait training;Therapeutic exercise;Patient/family education;Balance training;Functional mobility training    PT Goals (Current goals can be found in the Care Plan section)  Acute Rehab PT Goals Patient Stated Goal: get well then go home PT Goal Formulation: With patient Time For Goal Achievement: 12/25/18 Potential to Achieve Goals: Good    Frequency Min 2X/week   Barriers to discharge Decreased caregiver support      Co-evaluation               AM-PAC PT "6 Clicks" Mobility  Outcome Measure Help needed turning from your back to your side while in a flat bed without using bedrails?: A Lot Help needed moving from lying on your back to sitting on the side of a flat bed without using bedrails?: A Lot Help needed moving to and from a bed to a chair (including a wheelchair)?: A Lot Help needed standing up from a chair using your arms (e.g., wheelchair or bedside chair)?: A Little Help needed to walk in hospital room?: A Little Help needed climbing 3-5 steps with a railing? : A Lot 6 Click Score: 14    End of Session Equipment Utilized During Treatment: Gait belt Activity Tolerance: Patient  limited by fatigue Patient left: in bed;with call bell/phone within reach;with bed alarm set Nurse Communication: Mobility status PT Visit Diagnosis: Other abnormalities of gait and mobility (R26.89);Difficulty in walking, not elsewhere classified (R26.2)    Time: 1308-6578 PT Time Calculation (min) (ACUTE ONLY): 18 min   Charges:   PT Evaluation $PT Eval Low Complexity: 1 Low          Richetta Cubillos Terrial Rhodes, PT Acute Rehabilitation Services Pager [phone]  Office [phone]   Keali Mccraw D Despina [place] 12/11/2018, 5:32 PM

## 2018-12-12 LAB — CBC
HCT: 31.2 % — ABNORMAL LOW (ref 36.0–46.0)
Hemoglobin: 9.6 g/dL — ABNORMAL LOW (ref 12.0–15.0)
MCH: 28.2 pg (ref 26.0–34.0)
MCHC: 30.8 g/dL (ref 30.0–36.0)
MCV: 91.8 fL (ref 80.0–100.0)
Platelets: 351 10*3/uL (ref 150–400)
RBC: 3.4 MIL/uL — ABNORMAL LOW (ref 3.87–5.11)
RDW: 15.9 % — ABNORMAL HIGH (ref 11.5–15.5)
WBC: 7.2 10*3/uL (ref 4.0–10.5)
nRBC: 0 % (ref 0.0–0.2)

## 2018-12-12 LAB — GLUCOSE, CAPILLARY: Glucose-Capillary: 105 mg/dL — ABNORMAL HIGH (ref 70–99)

## 2018-12-12 LAB — BASIC METABOLIC PANEL
Anion gap: 17 — ABNORMAL HIGH (ref 5–15)
BUN: 16 mg/dL (ref 8–23)
CO2: 11 mmol/L — ABNORMAL LOW (ref 22–32)
Calcium: 7.7 mg/dL — ABNORMAL LOW (ref 8.9–10.3)
Chloride: 112 mmol/L — ABNORMAL HIGH (ref 98–111)
Creatinine, Ser: 1.42 mg/dL — ABNORMAL HIGH (ref 0.44–1.00)
GFR calc Af Amer: 38 mL/min — ABNORMAL LOW (ref >60)
GFR calc non Af Amer: 33 mL/min — ABNORMAL LOW (ref >60)
Glucose, Bld: 108 mg/dL — ABNORMAL HIGH (ref 70–99)
Potassium: 4 mmol/L (ref 3.5–5.1)
Sodium: 140 mmol/L (ref 135–145)

## 2018-12-12 LAB — TYPE AND SCREEN
ABO/RH(D): A POS
Antibody Screen: NEGATIVE
Unit division: 0
Unit division: 0

## 2018-12-12 LAB — BPAM RBC
Blood Product Expiration Date: 202010192359
Blood Product Expiration Date: 202010192359
ISSUE DATE / TIME: 202009251101
Unit Type and Rh: 6200
Unit Type and Rh: 6200

## 2018-12-12 NOTE — TOC Initial Note (Signed)
Transition of Care [facility]) - Initial/Assessment Note    Patient Details  Name: [name] MRN: [id number] Date of Birth: Dec 06, 1928  Transition of Care [facility]) CM/SW Contact:    Nila Nephew, LCSW Phone Number: [phone] 12/12/2018, 2:39 PM  Clinical Narrative:    Pt admitted initially to [facility] with acute anemia. Pt had been experiencing abdominal pain, and after work up was transferred to [facility] for surgery for colon mass, now s/p partial colectomy 12/08/18.  Pt has hx of breast cancer and ureteral transitional cellcarcinoma, and she sees Dr Delton Coombes at [facility].  Pt reports she resides alone. She is very independent, reports she made accommodations to her home over the years to make it navigable as she ages. Very motivated to return home.  Aware of SNF short term rehab recommendation and she is agreeable to this, reporting that 24/7 care and daily therapy would help her return home confidently. CSW made SNF referral and will follow up with bed offers.   Pt was alert and orientedx4, very pleasant and motivated. Conversed some with CSW re: her medical and social life history, explaining her husband is deceased, they had no children, and remaining family members "are mostly passed away now except for me and my cousin." Reports she and cousin are emotionally supportive of one another but cousin works therefore cannot provide in-home support for pt. States she is considering hiring private caregiving aides to assist her at home as her needs increase. States, "this is my 3rd bout with cancer. I am lucky so far, I believe God has a plan for me."  Reports she is extremely pleased with her providers both inpatient and outpatient and values their care and recommendations.   .   Expected Discharge Plan: Skilled Nursing Facility Barriers to Discharge: Continued Medical Work up, SNF Pending bed offer   Patient Goals and CMS Choice Patient states their goals for this  hospitalization and ongoing recovery are:: "get the best care I can and be able to go back home" CMS Medicare.gov Compare Post Acute Care list provided to:: Patient Choice offered to / list presented to : Patient  Expected Discharge Plan and Services Expected Discharge Plan: [place] In-house Referral: Clinical Social Work   Post Acute Care Choice: [place] Living arrangements for the past 2 months: [place]                                      Prior Living Arrangements/Services Living arrangements for the past 2 months: Single Family Home Lives with:: Self Patient language and need for interpreter reviewed:: Yes Do you feel safe going back to the place where you live?: Yes      Need for Family Participation in Patient Care: No (Comment)(pt independent and own decision maker.) Care giver support system in place?: No (comment)(pt lives alone, no in-home care needed prior) Current home services: DME Criminal Activity/Legal Involvement Pertinent to Current Situation/Hospitalization: No - Comment as needed  Activities of Daily Living Home Assistive Devices/Equipment: Cane (specify quad or straight), Wheelchair, Eyeglasses, Dentures (specify type) ADL Screening (condition at time of admission) Patient's cognitive ability adequate to safely complete daily activities?: Yes Is the patient deaf or have difficulty hearing?: No Does the patient have difficulty seeing, even when wearing glasses/contacts?: No Does the patient have difficulty concentrating, remembering, or making decisions?: No Patient able to express  need for assistance with ADLs?: Yes Does the patient have difficulty dressing or bathing?: No Independently performs ADLs?: Yes (appropriate for developmental age) Does the patient have difficulty walking or climbing stairs?: No Weakness of Legs: None Weakness of Arms/Hands: None  Permission Sought/Granted                   Emotional Assessment Appearance:: Appears stated age, Well-Groomed Attitude/Demeanor/Rapport: Engaged, Gracious, Charismatic Affect (typically observed): Accepting, Adaptable, Hopeful, Pleasant Orientation: : Oriented to Self, Oriented to Place, Oriented to  Time, Oriented to Situation Alcohol / Substance Use: Not Applicable Psych Involvement: No (comment)  Admission diagnosis:  Iron deficiency anemia due to chronic blood loss [D50.0] S/P colon resection [Z90.49] Patient Active Problem List   Diagnosis Date Noted  . Lower GI bleed from cecal cancer 12/10/2018  . S/P colon resection 12/08/2018  . Iron deficiency anemia due to chronic blood loss   . Urothelial carcinoma of left distal ureter ([place])   . 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 CKD (chronic kidney disease), stage IV ([place]) 12/05/2018  . Acute anemia 12/04/2018  . Ureteral mass 05/11/2018  . Bladder cancer ([place]) 03/20/2018  . 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 Chronic respiratory failure with hypoxia ([place]) 08/13/2016  . Pulmonary nodule 07/19/2016  . Renal bruit 06/29/2016  . Dyspnea 05/04/2016  . COPD (chronic obstructive pulmonary disease) ([place]) 04/26/2016  . Breast cancer, right breast ([place]) 05/01/2015  . Osteoarthritis of both knees 04/21/2015  . Acute bronchitis with COPD ([place]) 03/21/2015  . Abnormal CXR 01/23/2015  . Prediabetes 10/15/2014  . Osteopenia 09/18/2013  . Peripheral neuropathy 08/13/2013  . Hyperglycemia 08/13/2013  . Essential hypertension, benign 09/20/2012  . Carotid artery disease ([place]) 09/20/2012  . Hyperlipidemia 09/20/2012  . Arthritis of right knee 06/08/2007  . KNEE PAIN 06/08/2007   PCP:  Kathyrn Drown, MD Pharmacy:   [facility] [address] [place] [address] Phone: [phone] Fax: [phone]     Social Determinants of  Health (SDOH) Interventions    Readmission Risk Interventions No flowsheet data found.

## 2018-12-12 NOTE — Progress Notes (Signed)
Patient requesting a regular diet, saying that soft diet was limiting a lot of the food that she wanted to eat.  Discussed with Saverio Danker with general surgery and she said that she could have a diet, but to not eat a lot of fiber.  Patient in agreement and agrees to limit the amount of fiber she will eat.

## 2018-12-12 NOTE — NC FL2 (Signed)
[place] MEDICAID FL2 LEVEL OF CARE SCREENING TOOL     IDENTIFICATION  Patient Name: [name] Birthdate: 1929/03/04 Sex: female Admission Date (Current Location): 12/04/2018  [facility] and [place] Number:  Engineer, manufacturing systems and Address:  [facility],  [place], [place]      Provider Number: [id number]  Attending Physician Name and Address:  Donne Hazel, MD  Relative Name and Phone Number:       Current Level of Care: Hospital Recommended Level of Care: [place] Prior Approval Number:    Date Approved/Denied:   PASRR Number: [id number]  Discharge Plan:      Current Diagnoses: Patient Active Problem List   Diagnosis Date Noted  . Lower GI bleed from cecal cancer 12/10/2018  . S/P colon resection 12/08/2018  . Iron deficiency anemia due to chronic blood loss   . Urothelial carcinoma of left distal ureter ([place])   . 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 CKD (chronic kidney disease), stage IV ([place]) 12/05/2018  . Acute anemia 12/04/2018  . Ureteral mass 05/11/2018  . Bladder cancer ([place]) 03/20/2018  . 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 Chronic respiratory failure with hypoxia ([place]) 08/13/2016  . Pulmonary nodule 07/19/2016  . Renal bruit 06/29/2016  . Dyspnea 05/04/2016  . COPD (chronic obstructive pulmonary disease) ([place]) 04/26/2016  . Breast cancer, right breast ([place]) 05/01/2015  . Osteoarthritis of both knees 04/21/2015  . Acute bronchitis with COPD ([place]) 03/21/2015  . Abnormal CXR 01/23/2015  . Prediabetes 10/15/2014  . Osteopenia 09/18/2013  . Peripheral neuropathy 08/13/2013  . Hyperglycemia 08/13/2013  . Essential hypertension, benign 09/20/2012  . Carotid artery disease ([place]) 09/20/2012  . Hyperlipidemia 09/20/2012  . Arthritis of right knee 06/08/2007  . KNEE PAIN 06/08/2007     Orientation RESPIRATION BLADDER Height & Weight     Self, Time, Situation, Place  Normal Continent Weight: 136 lb 14.4 oz (62.1 kg) Height:  5' 2.5" (158.8 cm)  BEHAVIORAL SYMPTOMS/MOOD NEUROLOGICAL BOWEL NUTRITION STATUS      Continent Diet(soft diet)  AMBULATORY STATUS COMMUNICATION OF NEEDS Skin   Extensive Assist Verbally Normal                       Personal Care Assistance Level of Assistance  Bathing, Feeding, Dressing Bathing Assistance: Limited assistance Feeding assistance: Independent Dressing Assistance: Limited assistance     Functional Limitations Info  Sight, Hearing, Speech Sight Info: Adequate Hearing Info: Adequate Speech Info: Adequate    SPECIAL CARE FACTORS FREQUENCY  PT (By licensed PT), OT (By licensed OT)     PT Frequency: 5x OT Frequency: 5x            Contractures Contractures Info: Not present    Additional Factors Info  Code Status, Allergies Code Status Info: full code Allergies Info: Augmentin Amoxicillin-pot Clavulanate, Amoxicillin, Codeine, Morphine, Neomycin-bacitracin Zn-polymyx           Current Medications (12/12/2018):  This is the current hospital active medication list Current Facility-Administered Medications  Medication Dose Route Frequency Provider Last Rate Last Dose  . 0.9 %  sodium chloride infusion  250 mL Intravenous PRN Michael Boston, MD      . acetaminophen (TYLENOL) tablet 1,000 mg  1,000 mg Oral TID Michael Boston, MD   1,000 mg at 12/12/18 3267  . albumin human 5 % solution 12.5 g  12.5 g Intravenous Q6H PRN Michael Boston, MD      . alum & mag hydroxide-simeth (MAALOX/MYLANTA) 200-200-20 MG/5ML suspension 30 mL  30 mL Oral Q6H PRN Michael Boston, MD   30 mL at 12/11/18 1808  . amLODipine (NORVASC) tablet 5 mg  5 mg Oral Daily Donne Hazel, MD   5 mg at 12/12/18 0911  . arformoterol (BROVANA) nebulizer solution 15 mcg  15 mcg Nebulization BID Donne Hazel, MD   15 mcg at 12/12/18 [phone]  . calcium  carbonate (OS-CAL - dosed in mg of elemental calcium) tablet 500 mg of elemental calcium  1 tablet Oral Q breakfast Fanny Skates, MD   500 mg of elemental calcium at 12/12/18 0911  . chlorhexidine (PERIDEX) 0.12 % solution 15 mL  15 mL Mouth Rinse BID Donne Hazel, MD   15 mL at 12/12/18 0911  . enoxaparin (LOVENOX) injection 30 mg  30 mg Subcutaneous Q24H Donne Hazel, MD   30 mg at 12/12/18 1243  . feeding supplement (ENSURE SURGERY) liquid 237 mL  237 mL Oral BID BM Donne Hazel, MD   237 mL at 12/12/18 1341  . fluticasone (FLONASE) 50 MCG/ACT nasal spray 2 spray  2 spray Each Nare Daily PRN Donne Hazel, MD      . furosemide (LASIX) tablet 20 mg  20 mg Oral q morning - 10a Donne Hazel, MD   20 mg at 12/12/18 0912  . gabapentin (NEURONTIN) capsule 100 mg  100 mg Oral BID Michael Boston, MD   100 mg at 12/12/18 0911  . guaiFENesin-dextromethorphan (ROBITUSSIN DM) 100-10 MG/5ML syrup 10 mL  10 mL Oral Q4H PRN Michael Boston, MD      . hydrocortisone (ANUSOL-HC) 2.5 % rectal cream 1 application  1 application Topical QID PRN Michael Boston, MD      . hydrocortisone cream 1 % 1 application  1 application Topical TID PRN Michael Boston, MD      . HYDROmorphone (DILAUDID) injection 0.5 mg  0.5 mg Intravenous Q1H PRN Donne Hazel, MD      . ipratropium-albuterol (DUONEB) 0.5-2.5 (3) MG/3ML nebulizer solution 3 mL  3 mL Nebulization Q2H PRN Donne Hazel, MD      . labetalol (NORMODYNE) injection 5 mg  5 mg Intravenous Q2H PRN Fanny Skates, MD      . lip balm (CARMEX) ointment 1 application  1 application Topical BID Michael Boston, MD   1 application at 28/31/51 0912  . magic mouthwash  15 mL Oral QID PRN Michael Boston, MD      . MEDLINE mouth rinse  15 mL Mouth Rinse q12n4p Donne Hazel, MD   15 mL at 12/12/18 1243  . menthol-cetylpyridinium (CEPACOL) lozenge 3 mg  1 lozenge Oral PRN Michael Boston, MD      . methocarbamol (ROBAXIN) 1,000 mg in dextrose 5 % 50 mL IVPB  1,000 mg  Intravenous Q6H PRN Michael Boston, MD      . metoprolol tartrate (LOPRESSOR) tablet 12.5 mg  12.5 mg Oral BID Donne Hazel, MD   12.5 mg at 12/12/18 0911  . naphazoline-glycerin (CLEAR EYES REDNESS) ophth solution 1-2 drop  1-2 drop Both Eyes QID PRN Michael Boston, MD      . ondansetron [facility]) tablet 4 mg  4 mg Oral Q6H PRN Donne Hazel, MD       Or  . ondansetron [facility]) injection 4 mg  4 mg Intravenous Q6H PRN Wyline Copas,  Orpah Melter, MD   4 mg at 12/08/18 1924  . ondansetron (ZOFRAN) tablet 4 mg  4 mg Oral Q6H PRN Donne Hazel, MD      . phenol (CHLORASEPTIC) mouth spray 1-2 spray  1-2 spray Mouth/Throat PRN Michael Boston, MD      . simethicone (MYLICON) 40 LD/3.5TS suspension 40 mg  40 mg Oral QID PRN Michael Boston, MD   40 mg at 12/11/18 2309  . sodium chloride flush (NS) 0.9 % injection 3 mL  3 mL Intravenous Gorden Harms, MD   3 mL at 12/12/18 0913  . sodium chloride flush (NS) 0.9 % injection 3 mL  3 mL Intravenous PRN Michael Boston, MD   3 mL at 12/11/18 2303  . traMADol (ULTRAM) tablet 50-100 mg  50-100 mg Oral Q6H PRN Michael Boston, MD         Discharge Medications: Please see discharge summary for a list of discharge medications.  Relevant Imaging Results:  Relevant Lab Results:   Additional Information SS# [id number]  Nila Nephew, LCSW

## 2018-12-12 NOTE — Consult Note (Signed)
[facility] CM Inpatient Consult   12/12/2018  [name] [birth year] [id number]    Patient'schart reviewed for 21% risk score for unplanned readmission and hospitalization under her Medicare/ [employer] membership; and to check for possible needs of [facility] Care Management services.  Patient was previously outreached by [facility] telephonic care management coordinator for EMMI follow-up calls.  Review of medical record and MD brief narrative show as: 85 y.o.femalewith medical history significant forbreast cancerS/P lumpectomy/radiation in 2017,PT2PN0 high-grade urothelial transitional cellcarcinomainvolving the left ureter,s/p robotic resection in 2/20by Dr. Ronne Binning,? CAD (patient denies), hypertension, COPD/emphysema who has home 02 for PRN use,prediabetesnot on medications, CKD -IV with baseline creatinine around 1.9 and GFR 20-24, LVH ( echo 2018 revealedMild LVH with LVEF 70-75% and grossly normal diastolic function)    presented to [facility] with c/o lower abdominal pain, fatigue and noted to have Hgb of 5.5 at PCP office prompting referral to ED.CT abdomen and pelvis was ordered without contrast- suggested adenocarcinoma of the colonwithnodal metastasis.   Chart review and PT/ OT notes show current disposition recommendation for skilled nursing facility (SNF).  Will follow for progress and disposition, if there are any changes with disposition/ needs, please refer to [facility] care management for follow-up of needs as appropriate.  Primary care provider is Dr. Lilyan Punt with [facility], listed to provide transition of care.  Plan: Will notify [facility] post acute RN coordinator of disposition to any facility covered by [facility] care management for post discharge follow up of needs.   For questions and referral, please call:  Karin Golden A. Anneth Brunell, BSN, RN-BC [facility] Liaison (covering for [facility]) Cell: [phone]

## 2018-12-12 NOTE — Progress Notes (Signed)
Pharmacy Brief Note - Alvimopan (Entereg)  The standing order set for alvimopan (Entereg) now includes an automatic order to discontinue the drug after the patient has had a bowel movement.  The change was approved by the [place] and the Medical Executive Committee.    This patient has had a bowel movement documented by nursing.  Therefore, alvimopan has been discontinued.  Plan discussed with Saverio Danker, PA.  If there are questions, please contact the pharmacy at [phone].  Thank you- Netta [place], [facility] 12/12/2018 10:38 AM

## 2018-12-12 NOTE — Progress Notes (Addendum)
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 Alyson Ingles, ? CAD (patient denies), hypertension, COPD/emphysema who has home 02 for PRN use, prediabetes not on medications, CKD -IV with baseline creatinine around 1.9 and GFR 20-24, LVH ( echo 2018 revealed Mild LVH with LVEF 70-75% and grossly normal diastolic function) presented to [facility] with c/o lower abdominal pain, fatigue and noted to have Hgb of 5.5 at PCP office prompting referral to ED. CT abdomen and pelvis was ordered without contrast-suggested adenocarcinoma of the colonwithnodal metastasis. She received 2 units of PRBC at [facility] and admitted to Hospitalist service with GI, [facility] and Oncology consultations. She underwent colonoscopy on 9/23 revealed large ulcerated mass at ascending colon, terminal ileum and cecum.--Biopsy/pathology is consistent with adenocarcinoma. CT of the chest planned as outpatient to help adequate staging of her malignancy through her primary oncologist Dr. Delton Coombes in [place]. GS Dr Dalbert Batman consulted for tumor resection and patient transferred to [facility] overnight. Patient planned for operative intervention this afternoon and Urology anticipated to be involved for intraoperative cystoscopy and placement of right ureteral stent.  Per records at [facility], it appears that patients renal function remained stable, she however was noted to have hypocalcemia (low even when corrected to albumin) and she received IV/po replacement. Hospitalist service requested to perform urgent pre-op risk eval this morning.  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 Colon adencarcinoma:  -Pt now s/p partial colectetomy 9/25 -CEA is 12.8. -Recommend metastaitic w/u at some point through primary oncologist.  -Her last PET scan was in 1/20.   -Surgical pathology from 9/25 confirms adenocarcinoma, moderately differentiated, to follow up with Oncology as outpatient -Recent post-op ileus, now resolved -Pt advancing diet per surgery -PT/OT recommendation for SNF, SW following  2. Acute blood loss anemia on chronic anemia:  -in the setting of #1. S/P PRBC transfusion x 2 at [facility].  -Hgb remains stable -Will need close monitoring postoperatively and transfuse FOR <7.   3. CAD:  -Denies chest pain this AM -Remains stable at this time  4. COPD:  -Stable and without wheezing -Continue with neb rx and pulmonary toilet post operatively as needed  5. Hypokalemia/Hypocalcemia:  -Corrected -Repeat lytes in AM  6. CKD-IV:  -creatinine baseline around 1.9 and GFR at 28. -Cr remains 1.42  7. Transitional cell left Ureteral :  -s/p resection by Dr Alyson Ingles in 04/2018.  -Pt now s/p intraoperative cystoscopy and placement of right ureteral stent by Urology 9/25  -Stable at this time  8. Right Breast cancer, stage 1:  -Right lumpectomy/radiation in 2017, 0.7 cm IDC, grade 1, ER positive, PR negative, HER-2 negative. -Continued on anastrozole at this time.  -Mammogram on 09/19/2018 was BI-RADS Category 2. -Seems stable at this time  9.  Prediabetes:  -Not on any medications  at baseline. HgbA1C in 2/20 was 5.2 -Glucose trends have remained stable  10. HTN:  -Continue Amlodipine as tolerated -BP currently  suboptimally controlled -Continue on PRN labetalol as needed  11. Asymptomatic bacteriuria -Pt noted to have >100,000 klebsiella oxytoca on 9/25 urine culture -Pt remains asymptomatic, afebrile, no leukocytosis -Cont to hold off on antimicrobial   DVT prophylaxis: Lovenox subQ Code Status: Full Family Communication: Pt in room,family not at bedside Disposition Plan: Uncertain at this time  Consultants:   General Surgery  Urology  Procedures:   Open laparotomy with lysis of adhesions, R colectomy, small bowel resection, ileocolic anastomosis, repair of serosal injury sigmoid colon - 9/25  Cystoscopy with R ureteral stent placement - 9/25  Antimicrobials: Anti-infectives (From admission, onward)   Start     Dose/Rate Route Frequency Ordered Stop   12/09/18 0130  cefoTEtan (CEFOTAN) 2 g in sodium chloride 0.9 % 100 mL IVPB     2 g 200 mL/hr over 30 Minutes Intravenous Every 12 hours 12/08/18 1740 12/09/18 0049   12/08/18 0645  cefoTEtan (CEFOTAN) 2 g in sodium chloride 0.9 % 100 mL IVPB     2 g 200 mL/hr over 30 Minutes Intravenous On call to O.R. 12/08/18 9798 12/08/18 1337   12/08/18 0645  clindamycin (CLEOCIN) 900 mg, gentamicin (GARAMYCIN) 240 mg in sodium chloride 0.9 % 1,000 mL for intraperitoneal lavage  Status:  Discontinued      Irrigation To Surgery 12/08/18 0634 12/08/18 1729      Subjective: Passed BM overnight with relief  Objective: Vitals:   12/11/18 2033 12/12/18 0526 12/12/18 0952 12/12/18 1410  BP: (!) 153/81 135/77  135/60  Pulse: 91 90  88  Resp:  16  16  Temp: 98.1 F (36.7 C) 98.3 F (36.8 C)  98.3 F (36.8 C)  TempSrc: Oral Oral  Oral  SpO2: 95% 95% 96%   Weight:      Height:        Intake/Output Summary (Last 24 hours) at 12/12/2018 1925 Last data filed at 12/12/2018 1745 Gross per 24 hour  Intake 360 ml  Output -  Net 360 ml   Filed Weights   12/09/18 0500 12/10/18 0500 12/11/18 0533  Weight: 61.7 kg 63.8 kg 62.1 kg     Examination: General exam: Conversant, in no acute distress Respiratory system: normal chest rise, clear, no audible wheezing Cardiovascular system: regular rhythm, s1-s2 Gastrointestinal system: Nondistended, nontender, pos BS Central nervous system: No seizures, no tremors Extremities: No cyanosis, no joint deformities Skin: No rashes, no pallor Psychiatry: Affect normal // no auditory hallucinations   Data Reviewed: I have personally reviewed following labs and imaging studies  CBC: Recent Labs  Lab 12/08/18 0541 12/08/18 1636 12/09/18 0158 12/10/18 0151 12/12/18 0548  WBC 11.1* 16.5* 15.8* 11.9* 7.2  NEUTROABS 8.8*  --   --   --   --   HGB 8.0* 10.5* 10.3* 9.5* 9.6*  HCT 26.6* 35.1* 33.3* 30.7* 31.2*  MCV 94.3 95.9 93.8 91.4 91.8  PLT 347 355 350 349 921   Basic Metabolic Panel: Recent Labs  Lab 12/07/18 0929 12/08/18 0541 12/08/18 1636 12/09/18 0158 12/10/18 0151 12/11/18 0556 12/12/18 0548  NA 137  135 139  --  139 139 140 140  K 3.1*  2.9* 4.4 4.7 4.5 3.7 3.3* 4.0  CL 107  105 112*  --  112* 109 110 112*  CO2 18*  20* 20*  --  18* 18* 17* 11*  GLUCOSE 178*  182* 102*  --  179* 98 83 108*  BUN '22  21 16  '$ --  '17 15 14 16  '$ CREATININE 1.88*  1.93* 1.60* 1.76* 1.77* 1.66* 1.42* 1.42*  CALCIUM 6.6*  6.4* 7.2*  --  7.2* 7.1* 7.0* 7.7*  MG 2.3  --   --   --   --   --   --   PHOS 2.3*  --   --   --   --   --   --    GFR: Estimated Creatinine Clearance: 24 mL/min (A) (by C-G formula based on SCr of 1.42 mg/dL (H)). Liver Function Tests: Recent Labs  Lab 12/07/18 0929 12/09/18 0158  AST  --  26  ALT  --  28  ALKPHOS  --  79  BILITOT  --  0.3  PROT  --  5.9*  ALBUMIN 2.8* 2.8*   No results for input(s): LIPASE, AMYLASE in the last 168 hours. No results for input(s): AMMONIA in the last 168 hours. Coagulation Profile: No results for input(s): INR, PROTIME in the last 168 hours. Cardiac Enzymes: No results for input(s): CKTOTAL, CKMB, CKMBINDEX,  TROPONINI in the last 168 hours. BNP (last 3 results) No results for input(s): PROBNP in the last 8760 hours. HbA1C: No results for input(s): HGBA1C in the last 72 hours. CBG: Recent Labs  Lab 12/10/18 0802 12/10/18 1216 12/10/18 1542 12/11/18 0750 12/12/18 0519  GLUCAP 82 137* 86 98 105*   Lipid Profile: No results for input(s): CHOL, HDL, LDLCALC, TRIG, CHOLHDL, LDLDIRECT in the last 72 hours. Thyroid Function Tests: No results for input(s): TSH, T4TOTAL, FREET4, T3FREE, THYROIDAB in the last 72 hours. Anemia Panel: No results for input(s): VITAMINB12, FOLATE, FERRITIN, TIBC, IRON, RETICCTPCT in the last 72 hours. Sepsis Labs: No results for input(s): PROCALCITON, LATICACIDVEN in the last 168 hours.  Recent Results (from the past 240 hour(s))  SARS CORONAVIRUS 2 (TAT 6-24 HRS) Nasopharyngeal Nasopharyngeal Swab     Status: None   Collection Time: 12/04/18  4:39 PM   Specimen: Nasopharyngeal Swab  Result Value Ref Range Status   SARS Coronavirus 2 NEGATIVE NEGATIVE Final    Comment: (NOTE) SARS-CoV-2 target nucleic acids are NOT DETECTED. The SARS-CoV-2 RNA is generally detectable in upper and lower respiratory specimens during the acute phase of infection. Negative results do not preclude SARS-CoV-2 infection, do not rule out co-infections with other pathogens, and should not be used as the sole basis for treatment or other patient management decisions. Negative results must be combined with clinical observations, patient history, and epidemiological information. The expected result is Negative. Fact Sheet for Patients: SugarRoll.be Fact Sheet for Healthcare Providers: https://www.woods-mathews.com/ This test is not yet approved or cleared by the [place] FDA and  has been authorized for detection and/or diagnosis of SARS-CoV-2 by FDA under an Emergency Use Authorization (EUA). This EUA will remain  in effect (meaning this  test can be used) for the duration of the COVID-19 declaration under Section 56 4(b)(1) of the Act, 21 U.S.C. section 360bbb-3(b)(1), unless the authorization is terminated or revoked sooner. Performed at [facility], [place] [address] [place], [place] [address]   Surgical pcr screen     Status: None   Collection Time: 12/08/18  6:21 AM   Specimen: Nasal Mucosa; Nasal Swab  Result Value Ref Range Status   MRSA, PCR NEGATIVE NEGATIVE Final   Staphylococcus aureus NEGATIVE NEGATIVE Final    Comment: (NOTE) The Xpert SA Assay (FDA approved  for NASAL specimens in patients 60 years of age and older), is one component of a comprehensive surveillance program. It is not intended to diagnose infection nor to guide or monitor treatment. Performed at [facility], [place] [address], [place], [place] [address]   Urine Culture     Status: Abnormal   Collection Time: 12/08/18  2:07 PM   Specimen: Urine, Random  Result Value Ref Range Status   Specimen Description   Final    URINE, RANDOM Performed at [facility], [place] [address] [place], [place] [address]    Special Requests   Final    NONE Performed at [facility], [place] [address], [place], [place] [address]    Culture >=100,000 COLONIES/mL KLEBSIELLA OXYTOCA (A)  Final   Report Status 12/10/2018 FINAL  Final   Organism ID, Bacteria KLEBSIELLA OXYTOCA (A)  Final      Susceptibility   Klebsiella oxytoca - MIC*    AMPICILLIN RESISTANT Resistant     CEFAZOLIN >=64 RESISTANT Resistant     CEFTRIAXONE <=1 SENSITIVE Sensitive     CIPROFLOXACIN <=0.25 SENSITIVE Sensitive     GENTAMICIN <=1 SENSITIVE Sensitive     IMIPENEM <=0.25 SENSITIVE Sensitive     NITROFURANTOIN 32 SENSITIVE Sensitive     TRIMETH/SULFA <=20 SENSITIVE Sensitive     AMPICILLIN/SULBACTAM 4 SENSITIVE Sensitive     PIP/TAZO <=4 SENSITIVE Sensitive     Extended ESBL NEGATIVE Sensitive     * >=100,000 COLONIES/mL KLEBSIELLA  OXYTOCA     Radiology Studies: No results found.  Scheduled Meds: . acetaminophen  1,000 mg Oral TID  . amLODipine  5 mg Oral Daily  . arformoterol  15 mcg Nebulization BID  . calcium carbonate  1 tablet Oral Q breakfast  . chlorhexidine  15 mL Mouth Rinse BID  . enoxaparin (LOVENOX) injection  30 mg Subcutaneous Q24H  . feeding supplement  237 mL Oral BID BM  . furosemide  20 mg Oral q morning - 10a  . gabapentin  100 mg Oral BID  . lip balm  1 application Topical BID  . mouth rinse  15 mL Mouth Rinse q12n4p  . metoprolol tartrate  12.5 mg Oral BID  . sodium chloride flush  3 mL Intravenous Q12H   Continuous Infusions: . sodium chloride    . albumin human    . methocarbamol (ROBAXIN) IV       LOS: 7 days   Marylu Lund, MD [facility] Pager On Amion  If 7PM-7AM, please contact night-coverage 12/12/2018, 7:25 PM

## 2018-12-12 NOTE — Progress Notes (Signed)
Patient ID: [name], female   DOB: 11/20/1928, 85 y.o.   MRN: [id number]    4 Days Post-Op  Subjective: Patient feels the best she has felt so far this hospitalization today.  Had a good size BM last night.  No nausea and feels less bloated.  Tolerating full liquids with no issues.  Objective: Vital signs in last 24 hours: Temp:  [98.1 F (36.7 C)-98.3 F (36.8 C)] 98.3 F (36.8 C) (09/29 0526) Pulse Rate:  [87-91] 90 (09/29 0526) Resp:  [16-18] 16 (09/29 0526) BP: (135-153)/(70-81) 135/77 (09/29 0526) SpO2:  [92 %-100 %] 95 % (09/29 0526) Last BM Date: 12/12/18  Intake/Output from previous day: 09/28 0701 - 09/29 0700 In: 480 [P.O.:480] Out: -  Intake/Output this shift: No intake/output data recorded.  PE: Heart: regular Lungs: CTAB Abd: soft, appropriately tender, +BS, ND, midline incision c/d/i with staples present  Lab Results:  Recent Labs    12/10/18 0151 12/12/18 0548  WBC 11.9* 7.2  HGB 9.5* 9.6*  HCT 30.7* 31.2*  PLT 349 351   BMET Recent Labs    12/11/18 0556 12/12/18 0548  NA 140 140  K 3.3* 4.0  CL 110 112*  CO2 17* 11*  GLUCOSE 83 108*  BUN 14 16  CREATININE 1.42* 1.42*  CALCIUM 7.0* 7.7*   PT/INR No results for input(s): LABPROT, INR in the last 72 hours. CMP     Component Value Date/Time   NA 140 12/12/2018 0548   NA 141 10/09/2018 0918   K 4.0 12/12/2018 0548   CL 112 (H) 12/12/2018 0548   CO2 11 (L) 12/12/2018 0548   GLUCOSE 108 (H) 12/12/2018 0548   BUN 16 12/12/2018 0548   BUN 33 (H) 10/09/2018 0918   CREATININE 1.42 (H) 12/12/2018 0548   CREATININE 0.83 04/03/2014 1556   CALCIUM 7.7 (L) 12/12/2018 0548   PROT 5.9 (L) 12/09/2018 0158   PROT 6.6 10/09/2018 0918   ALBUMIN 2.8 (L) 12/09/2018 0158   ALBUMIN 4.1 10/09/2018 0918   AST 26 12/09/2018 0158   ALT 28 12/09/2018 0158   ALKPHOS 79 12/09/2018 0158   BILITOT 0.3 12/09/2018 0158   BILITOT <0.2 10/09/2018 0918   GFRNONAA 33 (L) 12/12/2018 0548   GFRAA 38 (L)  12/12/2018 0548   Lipase     Component Value Date/Time   LIPASE 32 12/04/2018 1205       Studies/Results: No results found.  Anti-infectives: Anti-infectives (From admission, onward)   Start     Dose/Rate Route Frequency Ordered Stop   12/09/18 0130  cefoTEtan (CEFOTAN) 2 g in sodium chloride 0.9 % 100 mL IVPB     2 g 200 mL/hr over 30 Minutes Intravenous Every 12 hours 12/08/18 1740 12/09/18 0049   12/08/18 0645  cefoTEtan (CEFOTAN) 2 g in sodium chloride 0.9 % 100 mL IVPB     2 g 200 mL/hr over 30 Minutes Intravenous On call to O.R. 12/08/18 0634 12/08/18 1337   12/08/18 0645  clindamycin (CLEOCIN) 900 mg, gentamicin (GARAMYCIN) 240 mg in sodium chloride 0.9 % 1,000 mL for intraperitoneal lavage  Status:  Discontinued      Irrigation To Surgery 12/08/18 0634 12/08/18 1729       Assessment/Plan Locally advanced adenocarcinoma of the cecum and  terminal ileum. -POD #4.  Laparotomy, right colon and distal ileum resection with primary anastomosis, repair serosal injury sigmoid colon, placement right ureteral stent, now removed. -Stable.  having bowel function.  Will adv to regular diet this   Patient ID: [name], female   DOB: 11/20/1928, 85 y.o.   MRN: [id number]    4 Days Post-Op  Subjective: Patient feels the best she has felt so far this hospitalization today.  Had a good size BM last night.  No nausea and feels less bloated.  Tolerating full liquids with no issues.  Objective: Vital signs in last 24 hours: Temp:  [98.1 F (36.7 C)-98.3 F (36.8 C)] 98.3 F (36.8 C) (09/29 0526) Pulse Rate:  [87-91] 90 (09/29 0526) Resp:  [16-18] 16 (09/29 0526) BP: (135-153)/(70-81) 135/77 (09/29 0526) SpO2:  [92 %-100 %] 95 % (09/29 0526) Last BM Date: 12/12/18  Intake/Output from previous day: 09/28 0701 - 09/29 0700 In: 480 [P.O.:480] Out: -  Intake/Output this shift: No intake/output data recorded.  PE: Heart: regular Lungs: CTAB Abd: soft, appropriately tender, +BS, ND, midline incision c/d/i with staples present  Lab Results:  Recent Labs    12/10/18 0151 12/12/18 0548  WBC 11.9* 7.2  HGB 9.5* 9.6*  HCT 30.7* 31.2*  PLT 349 351   BMET Recent Labs    12/11/18 0556 12/12/18 0548  NA 140 140  K 3.3* 4.0  CL 110 112*  CO2 17* 11*  GLUCOSE 83 108*  BUN 14 16  CREATININE 1.42* 1.42*  CALCIUM 7.0* 7.7*   PT/INR No results for input(s): LABPROT, INR in the last 72 hours. CMP     Component Value Date/Time   NA 140 12/12/2018 0548   NA 141 10/09/2018 0918   K 4.0 12/12/2018 0548   CL 112 (H) 12/12/2018 0548   CO2 11 (L) 12/12/2018 0548   GLUCOSE 108 (H) 12/12/2018 0548   BUN 16 12/12/2018 0548   BUN 33 (H) 10/09/2018 0918   CREATININE 1.42 (H) 12/12/2018 0548   CREATININE 0.83 04/03/2014 1556   CALCIUM 7.7 (L) 12/12/2018 0548   PROT 5.9 (L) 12/09/2018 0158   PROT 6.6 10/09/2018 0918   ALBUMIN 2.8 (L) 12/09/2018 0158   ALBUMIN 4.1 10/09/2018 0918   AST 26 12/09/2018 0158   ALT 28 12/09/2018 0158   ALKPHOS 79 12/09/2018 0158   BILITOT 0.3 12/09/2018 0158   BILITOT <0.2 10/09/2018 0918   GFRNONAA 33 (L) 12/12/2018 0548   GFRAA 38 (L)  12/12/2018 0548   Lipase     Component Value Date/Time   LIPASE 32 12/04/2018 1205       Studies/Results: No results found.  Anti-infectives: Anti-infectives (From admission, onward)   Start     Dose/Rate Route Frequency Ordered Stop   12/09/18 0130  cefoTEtan (CEFOTAN) 2 g in sodium chloride 0.9 % 100 mL IVPB     2 g 200 mL/hr over 30 Minutes Intravenous Every 12 hours 12/08/18 1740 12/09/18 0049   12/08/18 0645  cefoTEtan (CEFOTAN) 2 g in sodium chloride 0.9 % 100 mL IVPB     2 g 200 mL/hr over 30 Minutes Intravenous On call to O.R. 12/08/18 0634 12/08/18 1337   12/08/18 0645  clindamycin (CLEOCIN) 900 mg, gentamicin (GARAMYCIN) 240 mg in sodium chloride 0.9 % 1,000 mL for intraperitoneal lavage  Status:  Discontinued      Irrigation To Surgery 12/08/18 0634 12/08/18 1729       Assessment/Plan Locally advanced adenocarcinoma of the cecum and  terminal ileum. -POD #4.  Laparotomy, right colon and distal ileum resection with primary anastomosis, repair serosal injury sigmoid colon, placement right ureteral stent, now removed. -Stable.  having bowel function.  Will adv to regular diet this

## 2018-12-12 NOTE — Plan of Care (Signed)
Plan of care reviewed and discussed with the patient.  Ambulation and frequent OOB to chair encouraged.

## 2018-12-13 ENCOUNTER — Ambulatory Visit: Payer: Medicare Other | Admitting: Urology

## 2018-12-13 LAB — CBC
HCT: 31.8 % — ABNORMAL LOW (ref 36.0–46.0)
Hemoglobin: 9.7 g/dL — ABNORMAL LOW (ref 12.0–15.0)
MCH: 28.4 pg (ref 26.0–34.0)
MCHC: 30.5 g/dL (ref 30.0–36.0)
MCV: 93.3 fL (ref 80.0–100.0)
Platelets: 365 10*3/uL (ref 150–400)
RBC: 3.41 MIL/uL — ABNORMAL LOW (ref 3.87–5.11)
RDW: 15.9 % — ABNORMAL HIGH (ref 11.5–15.5)
WBC: 5.7 10*3/uL (ref 4.0–10.5)
nRBC: 0 % (ref 0.0–0.2)

## 2018-12-13 LAB — BASIC METABOLIC PANEL
Anion gap: 8 (ref 5–15)
BUN: 19 mg/dL (ref 8–23)
CO2: 21 mmol/L — ABNORMAL LOW (ref 22–32)
Calcium: 7.8 mg/dL — ABNORMAL LOW (ref 8.9–10.3)
Chloride: 111 mmol/L (ref 98–111)
Creatinine, Ser: 1.47 mg/dL — ABNORMAL HIGH (ref 0.44–1.00)
GFR calc Af Amer: 37 mL/min — ABNORMAL LOW (ref >60)
GFR calc non Af Amer: 32 mL/min — ABNORMAL LOW (ref >60)
Glucose, Bld: 92 mg/dL (ref 70–99)
Potassium: 3.7 mmol/L (ref 3.5–5.1)
Sodium: 140 mmol/L (ref 135–145)

## 2018-12-13 LAB — NOVEL CORONAVIRUS, NAA (HOSP ORDER, SEND-OUT TO REF LAB; TAT 18-24 HRS): SARS-CoV-2, NAA: NOT DETECTED

## 2018-12-13 LAB — GLUCOSE, CAPILLARY: Glucose-Capillary: 88 mg/dL (ref 70–99)

## 2018-12-13 NOTE — Progress Notes (Signed)
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 Ronne Binning, hypertension, COPD/emphysema who has home 02 for PRN use,CKD -IV with baseline creatinine around 1.9 and GFR 20-24 presented to [facility] with c/o lower abdominal pain, fatigue and noted to have Hgb of 5.5 at PCP office and sent to ED.  CT abdomen and pelvis revealed adenocarcinoma of the colon with nodal metastasis.  She received 2 units of RBC and admitted to hospitalist service with GI, GS and oncology consultation.  Underwent colonoscopy on 9/23 that revealed large ulcerated mass at the ascending colon, terminal ileum and cecum.  Pathology consistent with adenocarcinoma.  CT of the chest planned as outpatient to help adequate staging of her malignancy through her primary oncologist Dr. Ellin Saba in [place].  General surgery, Dr. Elroy Channel consulted for tumor resection and patient transferred to [facility] long.  Patient had partial colectomy by general surgery, & intraoperative cystoscopy and placement of right ureteral stent by urology on 9/25.  Patient had uncomplicated postoperative course.  Evaluated by PT/OT who recommended SNF.  Subjective: No major events overnight of this morning.  No complaints.  Feels better.  Tolerating solid food.  Denies chest pain, dyspnea, GI or GU symptoms.  Sitting on the edge of the bed.  Feels ready to go to rehab once SNF is available.  Objective: Vitals:   12/13/18 0500 12/13/18 0501 12/13/18 0726 12/13/18 1350  BP:  (!) 155/75  132/81  Pulse:  88  85  Resp:  20  18  Temp:  (!) 97.5 F (36.4 C)  97.8 F (36.6 C)  TempSrc:  Oral    SpO2:  98% 96% 99%  Weight: 62.8 kg      Height:        Intake/Output Summary (Last 24 hours) at 12/13/2018 1721 Last data filed at 12/13/2018 1230 Gross per 24 hour  Intake 480 ml  Output 1 ml  Net 479 ml   Filed Weights   12/10/18 0500 12/11/18 0533 12/13/18 0500  Weight: 63.8 kg 62.1 kg 62.8 kg    Examination:  GENERAL: No acute distress.  Appears well.  HEENT: MMM.  Vision and hearing grossly intact.  NECK: Supple.  No apparent JVD.  RESP:  No IWOB. Good air movement bilaterally. CVS:  RRR. Heart sounds normal.  ABD/GI/GU: Bowel sounds present. Soft. Non tender.  MSK/EXT:  Moves extremities. No apparent deformity or edema.  SKIN: no apparent skin lesion or wound NEURO: Awake, alert and oriented appropriately.  No gross deficit.  PSYCH: Calm. Normal affect.   Assessment & Plan: Locally advanced adenocarcinoma of the cecum and terminal ileum -s/p laparotomy, right colon and distal ileum resection with primary anastomosis -Pathology revealed adenocarcinoma, moderately differentiated.  No carcinoma identified in 16 lymph nodes. -Patient to follow-up with her oncologist, Dr. Ellin Saba for further evaluation and treatment. -Final disposition SNF  Transitional cell carcinoma of left ureter -s/p resection by Dr. Ronne Binning in 04/2018 -Intraoperative cystoscopy and placement of right ureteral stent by urology 9/25   History of right breast cancer -Right lumpectomy/radiation in 2017, 0.7 cm IDC, grade 1, ER positive, PR negative, HER-2 negative. -Continued on anastrozole at this time.  -Mammogram on 09/19/2018 was BI-RADS Category 2. -Seems stable at this time  Acute  on chronic blood loss anemia: Likely due to the above. -H&H stable after 2 units at [facility]  Chronic COPD: On PRN oxygen at home.  Stable. -Continue PRN nebulizers  CKD-4: Stable -Avoid nephrotoxic meds.  Essential hypertension: Normotensive -Continue amlodipine and PRN labetalol.  Asymptomatic bacteriuria: urine culture grew Klebsiella oxytoca on  9/25. -Could have benefited from antibiotics given urologic procedure but doubt benefit at this time.  DVT prophylaxis: Subcu Lovenox Code Status: Full code Family Communication: Patient and/or RN. Available if any question.  Disposition Plan: Medically stable for discharge to SNF.  Waiting on insurance authorization. Consultants: Urology, general surgery  Procedures:   Open laparotomy with lysis of adhesions, R colectomy, small bowel resection, ileocolic anastomosis, repair of serosal injury sigmoid colon - 9/25  Cystoscopy with R ureteral stent placement - 9/25  Microbiology summarized: -COVID-19 negative on 9/21 and 9/29 -MRSA PCR negative. -Urine culture Klebsiella oxytoca.   Antimicrobials: Anti-infectives (From admission, onward)   Start     Dose/Rate Route Frequency Ordered Stop   12/09/18 0130  cefoTEtan (CEFOTAN) 2 g in sodium chloride 0.9 % 100 mL IVPB     2 g 200 mL/hr over 30 Minutes Intravenous Every 12 hours 12/08/18 1740 12/09/18 0049   12/08/18 0645  cefoTEtan (CEFOTAN) 2 g in sodium chloride 0.9 % 100 mL IVPB     2 g 200 mL/hr over 30 Minutes Intravenous On call to O.R. 12/08/18 7846 12/08/18 1337   12/08/18 0645  clindamycin (CLEOCIN) 900 mg, gentamicin (GARAMYCIN) 240 mg in sodium chloride 0.9 % 1,000 mL for intraperitoneal lavage  Status:  Discontinued      Irrigation To Surgery 12/08/18 0634 12/08/18 1729      Sch Meds:  Scheduled Meds: . acetaminophen  1,000 mg Oral TID  . amLODipine  5 mg Oral Daily  . arformoterol  15 mcg Nebulization BID  . calcium carbonate  1 tablet Oral Q breakfast  . chlorhexidine  15 mL Mouth Rinse BID  . enoxaparin (LOVENOX) injection  30 mg Subcutaneous Q24H  . feeding supplement  237 mL Oral BID BM  . furosemide  20 mg Oral q morning - 10a  . gabapentin  100 mg Oral BID  . lip balm  1 application Topical BID  . mouth rinse  15 mL Mouth Rinse q12n4p  . metoprolol tartrate  12.5 mg Oral BID  . sodium chloride flush  3 mL  Intravenous Q12H   Continuous Infusions: . sodium chloride    . methocarbamol (ROBAXIN) IV     PRN Meds:.sodium chloride, alum & mag hydroxide-simeth, fluticasone, guaiFENesin-dextromethorphan, hydrocortisone, hydrocortisone cream, HYDROmorphone (DILAUDID) injection, ipratropium-albuterol, labetalol, magic mouthwash, menthol-cetylpyridinium, methocarbamol (ROBAXIN) IV, naphazoline-glycerin, ondansetron **OR** ondansetron (ZOFRAN) IV, ondansetron **OR** [DISCONTINUED] ondansetron (ZOFRAN) IV, phenol, simethicone, sodium chloride flush, traMADol   I have personally reviewed the following labs and images: CBC: Recent Labs  Lab 12/08/18 0541 12/08/18 1636 12/09/18 0158 12/10/18 0151 12/12/18 0548 12/13/18 0638  WBC 11.1* 16.5* 15.8* 11.9* 7.2 5.7  NEUTROABS 8.8*  --   --   --   --   --   HGB 8.0* 10.5* 10.3* 9.5* 9.6* 9.7*  HCT 26.6* 35.1* 33.3* 30.7* 31.2* 31.8*  MCV 94.3 95.9 93.8 91.4 91.8 93.3  PLT 347 355 350 349 351 365   BMP &GFR Recent Labs  Lab 12/07/18 0929  12/09/18 0158 12/10/18 0151 12/11/18 0556 12/12/18 0548 12/13/18 0638  NA 137  135   < > 139 139 140 140 140  K 3.1*  2.9*   < > 4.5 3.7 3.3* 4.0 3.7  CL 107  105   < > 112* 109 110 112* 111  CO2 18*  20*   < > 18* 18* 17* 11* 21*  GLUCOSE 178*  182*   < > 179* 98 83 108* 92  BUN 22  21   < > 17 15 14 16 19   CREATININE 1.88*  1.93*   < > 1.77* 1.66* 1.42* 1.42* 1.47*  CALCIUM 6.6*  6.4*   < > 7.2* 7.1* 7.0* 7.7* 7.8*  MG 2.3  --   --   --   --   --   --   PHOS 2.3*  --   --   --   --   --   --    < > = values in this interval not displayed.   Estimated Creatinine Clearance: 23.3 mL/min (A) (by C-G formula based on SCr of 1.47 mg/dL (H)). Liver & Pancreas: Recent Labs  Lab 12/07/18 0929 12/09/18 0158  AST  --  26  ALT  --  28  ALKPHOS  --  79  BILITOT  --  0.3  PROT  --  5.9*  ALBUMIN 2.8* 2.8*   No results for input(s): LIPASE, AMYLASE in the last 168 hours. No results for input(s):  AMMONIA in the last 168 hours. Diabetic: No results for input(s): HGBA1C in the last 72 hours. Recent Labs  Lab 12/10/18 1216 12/10/18 1542 12/11/18 0750 12/12/18 0519 12/13/18 0503  GLUCAP 137* 86 98 105* 88   Cardiac Enzymes: No results for input(s): CKTOTAL, CKMB, CKMBINDEX, TROPONINI in the last 168 hours. No results for input(s): PROBNP in the last 8760 hours. Coagulation Profile: No results for input(s): INR, PROTIME in the last 168 hours. Thyroid Function Tests: No results for input(s): TSH, T4TOTAL, FREET4, T3FREE, THYROIDAB in the last 72 hours. Lipid Profile: No results for input(s): CHOL, HDL, LDLCALC, TRIG, CHOLHDL, LDLDIRECT in the last 72 hours. Anemia Panel: No results for input(s): VITAMINB12, FOLATE, FERRITIN, TIBC, IRON, RETICCTPCT in the last 72 hours. Urine analysis:    Component Value Date/Time   COLORURINE YELLOW 12/30/2017 1039   APPEARANCEUR CLEAR 12/30/2017 1039   LABSPEC 1.012 12/30/2017 1039   PHURINE 5.0 12/30/2017 1039   GLUCOSEU NEGATIVE 12/30/2017 1039   HGBUR SMALL (A) 12/30/2017 1039   BILIRUBINUR NEGATIVE 12/30/2017 1039   KETONESUR NEGATIVE 12/30/2017 1039   PROTEINUR NEGATIVE 12/30/2017 1039   NITRITE NEGATIVE 12/30/2017 1039   LEUKOCYTESUR NEGATIVE 12/30/2017 1039   Sepsis Labs: Invalid input(s): PROCALCITONIN, LACTICIDVEN  Microbiology: Recent Results (from the past 240 hour(s))  SARS CORONAVIRUS 2 (TAT 6-24 HRS) Nasopharyngeal Nasopharyngeal Swab     Status: None   Collection Time: 12/04/18  4:39 PM   Specimen: Nasopharyngeal Swab  Result Value Ref Range Status   SARS Coronavirus 2 NEGATIVE NEGATIVE Final    Comment: (NOTE) SARS-CoV-2 target nucleic acids are NOT DETECTED. The SARS-CoV-2 RNA is generally detectable in upper and lower respiratory specimens during the acute phase of infection. Negative results do not preclude SARS-CoV-2 infection, do not rule out co-infections with other pathogens, and should not be used as the  sole basis for treatment or other patient management decisions. Negative results must be combined with clinical observations, patient history, and epidemiological information. The expected result is Negative. Fact Sheet for Patients: HairSlick.no Fact Sheet for Healthcare Providers: quierodirigir.com This test is not yet approved or cleared by the [place] FDA and  has been  authorized for detection and/or diagnosis of SARS-CoV-2 by FDA under an Emergency Use Authorization (EUA). This EUA will remain  in effect (meaning this test can be used) for the duration of the COVID-19 declaration under Section 56 4(b)(1) of the Act, 21 U.S.C. section 360bbb-3(b)(1), unless the authorization is terminated or revoked sooner. Performed at [facility], [address] [place], [place] [address]   Surgical pcr screen     Status: None   Collection Time: 12/08/18  6:21 AM   Specimen: Nasal Mucosa; Nasal Swab  Result Value Ref Range Status   MRSA, PCR NEGATIVE NEGATIVE Final   Staphylococcus aureus NEGATIVE NEGATIVE Final    Comment: (NOTE) The Xpert SA Assay (FDA approved for NASAL specimens in patients 1 years of age and older), is one component of a comprehensive surveillance program. It is not intended to diagnose infection nor to guide or monitor treatment. Performed at [facility], [address], [place], [place] [address]   Urine Culture     Status: Abnormal   Collection Time: 12/08/18  2:07 PM   Specimen: Urine, Random  Result Value Ref Range Status   Specimen Description   Final    URINE, RANDOM Performed at [facility], [address] [place], [place] [address]    Special Requests   Final    NONE Performed at [facility], [address], [place], [place] [address]    Culture >=100,000 COLONIES/mL KLEBSIELLA OXYTOCA (A)  Final   Report Status 12/10/2018 FINAL  Final    Organism ID, Bacteria KLEBSIELLA OXYTOCA (A)  Final      Susceptibility   Klebsiella oxytoca - MIC*    AMPICILLIN RESISTANT Resistant     CEFAZOLIN >=64 RESISTANT Resistant     CEFTRIAXONE <=1 SENSITIVE Sensitive     CIPROFLOXACIN <=0.25 SENSITIVE Sensitive     GENTAMICIN <=1 SENSITIVE Sensitive     IMIPENEM <=0.25 SENSITIVE Sensitive     NITROFURANTOIN 32 SENSITIVE Sensitive     TRIMETH/SULFA <=20 SENSITIVE Sensitive     AMPICILLIN/SULBACTAM 4 SENSITIVE Sensitive     PIP/TAZO <=4 SENSITIVE Sensitive     Extended ESBL NEGATIVE Sensitive     * >=100,000 COLONIES/mL KLEBSIELLA OXYTOCA  Novel Coronavirus, NAA (Hosp order, Send-out to Ref Lab; TAT 18-24 hrs     Status: None   Collection Time: 12/12/18  4:00 PM   Specimen: Nasopharyngeal Swab; Respiratory  Result Value Ref Range Status   SARS-CoV-2, NAA NOT DETECTED NOT DETECTED Final    Comment: (NOTE) This nucleic acid amplification test was developed and its performance characteristics determined by [employer]. Nucleic acid amplification tests include PCR and TMA. This test has not been FDA cleared or approved. This test has been authorized by FDA under an Emergency Use Authorization (EUA). This test is only authorized for the duration of time the declaration that circumstances exist justifying the authorization of the emergency use of in vitro diagnostic tests for detection of SARS-CoV-2 virus and/or diagnosis of COVID-19 infection under section 564(b)(1) of the Act, 21 U.S.C. 295JOA-4(Z) (1), unless the authorization is terminated or revoked sooner. When diagnostic testing is negative, the possibility of a false negative result should be considered in the context of a patient's recent exposures and the presence of clinical signs and symptoms consistent with COVID-19. An individual without symptoms of COVID- 19 and who is not shedding SARS-CoV-2 vi rus would expect to have a negative (not detected) result in this  assay.  Performed At: [facility] [address] [place], [place] [id number] Jolene Schimke MD WG:[id number]    Coronavirus Source NASOPHARYNGEAL  Final    Comment: Performed at [facility], [address] [place], [place] [address]    Radiology Studies: No results found.   Bronson Bressman T. Treyvon Blahut [facility]  If 7PM-7AM, please contact night-coverage [email or web address] Password [email or web address] 12/13/2018, 5:21 PM

## 2018-12-13 NOTE — Progress Notes (Addendum)
5 Days Post-Op  Subjective: Alert and stable.  Afebrile.  No tachycardia Mobilizing slowly but still has not ambulated in hall Tolerating solid diet reasonably well Says she has 3-4 loose stools per day, usually right after a meal, states it goes right through her Passing flatus Denies nausea Says her abdomen is sore, but no more sore than it was 2 or 3 days ago. Says her abdomen is a little bit bloated compared to baseline  Labs today look good.  WBC 5700.  Hemoglobin 9.7.  Potassium 3.7.  CO2 up to 21.  Creatinine 1.47.  BUN 19.  Working on disposition.  Current plan SNF at [facility] I have signed FL 2 form. Awaiting confirmation  Objective: Vital signs in last 24 hours: Temp:  [97.5 F (36.4 C)-98.3 F (36.8 C)] 97.5 F (36.4 C) (09/30 0501) Pulse Rate:  [88-95] 88 (09/30 0501) Resp:  [16-20] 20 (09/30 0501) BP: (135-156)/(60-76) 155/75 (09/30 0501) SpO2:  [96 %-99 %] 96 % (09/30 0726) Weight:  [62.8 kg] 62.8 kg (09/30 0500) Last BM Date: 12/12/18  Intake/Output from previous day: 09/29 0701 - 09/30 0700 In: 360 [P.O.:360] Out: -  Intake/Output this shift: No intake/output data recorded.    EXAM: General appearance: Alert and oriented.  Does not appear to be in any distress.  Somewhat deconditioned Resp: clear to auscultation bilaterally GI: Soft.  Mild distention.  No peritoneal signs.  Mild diffuse tenderness.  Midline incision clean and dry. Extremities: extremities normal, atraumatic, no cyanosis or edema     Lab Results:  Results for orders placed or performed during the hospital encounter of 12/04/18 (from the past 24 hour(s))  Glucose, capillary     Status: None   Collection Time: 12/13/18  5:03 AM  Result Value Ref Range   Glucose-Capillary 88 70 - 99 mg/dL  Basic metabolic panel     Status: Abnormal   Collection Time: 12/13/18  6:38 AM  Result Value Ref Range   Sodium 140 135 - 145 mmol/L   Potassium 3.7 3.5 - 5.1 mmol/L   Chloride 111 98 - 111  mmol/L   CO2 21 (L) 22 - 32 mmol/L   Glucose, Bld 92 70 - 99 mg/dL   BUN 19 8 - 23 mg/dL   Creatinine, Ser 1.47 (H) 0.44 - 1.00 mg/dL   Calcium 7.8 (L) 8.9 - 10.3 mg/dL   GFR calc non Af Amer 32 (L) >60 mL/min   GFR calc Af Amer 37 (L) >60 mL/min   Anion gap 8 5 - 15  CBC     Status: Abnormal   Collection Time: 12/13/18  6:38 AM  Result Value Ref Range   WBC 5.7 4.0 - 10.5 K/uL   RBC 3.41 (L) 3.87 - 5.11 MIL/uL   Hemoglobin 9.7 (L) 12.0 - 15.0 g/dL   HCT 31.8 (L) 36.0 - 46.0 %   MCV 93.3 80.0 - 100.0 fL   MCH 28.4 26.0 - 34.0 pg   MCHC 30.5 30.0 - 36.0 g/dL   RDW 15.9 (H) 11.5 - 15.5 %   Platelets 365 150 - 400 K/uL   nRBC 0.0 0.0 - 0.2 %     Studies/Results: No results found.  Marland Kitchen acetaminophen  1,000 mg Oral TID  . amLODipine  5 mg Oral Daily  . arformoterol  15 mcg Nebulization BID  . calcium carbonate  1 tablet Oral Q breakfast  . chlorhexidine  15 mL Mouth Rinse BID  . enoxaparin (LOVENOX) injection  30 mg  Subcutaneous Q24H  . feeding supplement  237 mL Oral BID BM  . furosemide  20 mg Oral q morning - 10a  . gabapentin  100 mg Oral BID  . lip balm  1 application Topical BID  . mouth rinse  15 mL Mouth Rinse q12n4p  . metoprolol tartrate  12.5 mg Oral BID  . sodium chloride flush  3 mL Intravenous Q12H     Assessment/Plan: s/p Procedure(s): OPEN SEGMENTAL COLON RESECTION CYSTOSCOPY WITH RETROGRADE PYELOGRAM/URETERAL STENT PLACEMENT  Locally advanced adenocarcinoma of the cecum and terminal ileum. -POD #5. Laparotomy, right colon and distal ileum resection with primary anastomosis, repair serosal injury sigmoid colon,placement right ureteral stent, now removed. -Stable. having bowel function.    Tolerating soft diet reasonably well.  entereg discontinued since having bowel movements -Appreciate input from OT, PT, case management. -DVT prophylaxis with Lovenox- -pathology:  A. COLON, RIGHT AND ILEUM, RESECTION:  - Adenocarcinoma, moderately  differentiated, 7 cm  - No carcinoma identified in sixteen lymph nodes (0/16)  - Lymphovascular space invasion present  - Margins uninvolved by carcinoma  -Outpatient follow-up with medical oncology.(Dr. Delton Coombes) -Consider genetics -We will need CT chest to complete staging work-up.  This can be done as outpatient -considering advanced age it is likely that risk would outweighbenefit from adjuvant therapies.  -Diarrhea.  Physiologic secondary to resection right colon, ileocecal valve, and 2 to 3 feet of small bowel.  Likely this will resolve over time.  Consider Questran if persists.  Anemia requiring transfusion.This is acute blood loss anemia superimposed on chronic anemia. -Hemoglobin 9.6 9/28.Marland Kitchen No indication for transfusion  COPD. Moderately severe but seems stable.Followed by Dr. Rebecca Eaton outpatient. -Currently seems stable on nebulizers. -Appreciate internal medicine management  Right breast cancer.Status post right lumpectomy May 23, 2015 for stage I a invasive ductal carcinoma, receptor positive, HER-2 negative. She completed radiation therapy and currently tolerating anastrozole with no evidence of disease  Urothelial carcinoma left ureter, status post robotic resection distal left ureter with reimplantation February 2020 no evidence of hydronephrosis on either side  Lung nodules  Hypertension  CKD 4 -Creatinine stable.  1.47 this morning.  Baseline.   Hypokalemia- K+ 4.0, 3.7.  Considering use of Lasix and loose stools, perhaps she should be on a daily dose of potassium supplement.  Defer to internal medicine  DM2 -CBG 86 this morning.  Disposition. -She lives alone and is independent. She has no children. She has cousins in the area. She is now in agreement for SNF and prefers [facility]. -FL 2 signed.  Placement in progress. -Unless new problems arise, she should be ready for discharge tomorrow -[facility] case management   involved    '@PROBHOSP'$ @  LOS: 8 days    Adin Hector 12/13/2018  . .prob

## 2018-12-13 NOTE — Progress Notes (Signed)
Physical Therapy Treatment Patient Details Name: [name] MRN: [id number] DOB: [birth year] Today's Date: 12/13/2018    History of Present Illness 85 yo female admitted to ED on 9/21 with anemia, colonic adenocarcinoma. Pt with history of urothelial carcinoma with lap nephroureterectomy2/2020 and now s/p cystoscopy and R ureteral stent placement 9/25; open laparoscopy, lysis of adhesions, R colectomy, SB resection, ileocolic anastomosis, repair of serosal injury of sigmoid colon on 9/25. PMH includes breast cancer, COPD, DM, HTN, OA, osteoporosis, peripheral neuropathy.    PT Comments    Pt agreeable to PT session. She stated she had just returned from bathroom visit and that it usually makes her feel weak afterwards. Deferred ambulation at this time. She was able to perform seated and standing exercises with rest breaks as needed. Recommend ambulation with nursing assistance as able. Will continue to follow. Continue to recommend SNF.     Follow Up Recommendations  SNF     Equipment Recommendations       Recommendations for Other Services       Precautions / Restrictions Precautions Precautions: Fall Restrictions Weight Bearing Restrictions: No    Mobility  Bed Mobility               General bed mobility comments: oob in recliner  Transfers Overall transfer level: Needs assistance Equipment used: Rolling walker (2 wheeled) Transfers: Sit to/from Stand Sit to Stand: Min guard         General transfer comment: x 4 (did sit to stand for strengthening). VCs safety, hand placement. Increased time.  Ambulation/Gait             General Gait Details: NT-pt fatigued after bathroom visit   Stairs             Wheelchair Mobility    Modified Rankin (Stroke Patients Only)       Balance                                            Cognition Arousal/Alertness: Awake/alert Behavior During Therapy: WFL for tasks  assessed/performed Overall Cognitive Status: Within Functional Limits for tasks assessed                                        Exercises General Exercises - Lower Extremity Ankle Circles/Pumps: AROM;Both;20 reps;Seated Quad Sets: AROM;10 reps;Seated Long Arc Quad: AROM;Both;10 reps;Seated Heel Slides: AROM;Both;Seated;5 reps Hip ABduction/ADduction: AROM;Both;10 reps;Seated Hip Flexion/Marching: AROM;Right;Left;10 reps;Standing Heel Raises: AROM;Both;10 reps;Standing Other Exercises Other Exercises: sit to stand x 3    General Comments        Pertinent Vitals/Pain Pain Assessment: Faces Pain Score: 4  Pain Location: abdomen Pain Descriptors / Indicators: Discomfort;Sore Pain Intervention(s): Monitored during session;Limited activity within patient's tolerance    Home Living                      Prior Function            PT Goals (current goals can now be found in the care plan section) Progress towards PT goals: Progressing toward goals    Frequency    Min 2X/week      PT Plan Current plan remains appropriate    Co-evaluation  AM-PAC PT "6 Clicks" Mobility   Outcome Measure  Help needed turning from your back to your side while in a flat bed without using bedrails?: A Little Help needed moving from lying on your back to sitting on the side of a flat bed without using bedrails?: A Lot Help needed moving to and from a bed to a chair (including a wheelchair)?: A Little Help needed standing up from a chair using your arms (e.g., wheelchair or bedside chair)?: A Little Help needed to walk in hospital room?: A Little Help needed climbing 3-5 steps with a railing? : A Lot 6 Click Score: 16    End of Session Equipment Utilized During Treatment: Gait belt Activity Tolerance: Patient limited by fatigue Patient left: in chair;with call bell/phone within reach   PT Visit Diagnosis: Other abnormalities of gait and  mobility (R26.89);Difficulty in walking, not elsewhere classified (R26.2)     Time: 5797-2820 PT Time Calculation (min) (ACUTE ONLY): 23 min  Charges:  $Therapeutic Exercise: 23-37 mins                        Weston Anna, PT Acute Rehabilitation Services Pager: [phone] Office: [phone]

## 2018-12-14 NOTE — TOC Progression Note (Addendum)
Transition of Care [facility]) - Progression Note    Patient Details  Name: STEVI HOLLINSHEAD MRN: [id number] Date of Birth: 26-May-1928  Transition of Care [facility]) CM/SW [place], [place] Phone Number: [phone] 12/14/2018, 11:42 AM  Clinical Narrative:   Pt accepted at [facility] for rehab and has been looking forward to transitioning there at [place]. However, received word that facility ahd one resident test positive for covid within past day and, while that patient would be isolated and not in any vicinity to rehab admission pts such as [name] or have same staff. Pt wants to proceed with admission "if Dr. Harland Dingwall would still recommend given this information, want to make sure he knows and what he would say." Asked CSW for assistance contacting office- left message with office staff.  Will follow up.   14:42 update: CSW spoke with RN Sunday Spillers at Dr. Darrel Hoover office who was informed about situation and relayed to physician pt's request for physician opinion on her options. RN reported physician advised feels that pt would be safe if covid positive pt not located within same building. This is not currently the case as the facility is isolated units/wings but not separated buildings. Discussed physician's response with pt. Pt states she has been considering in the interim and feels afraid of known positive case, while understanding that at any location a person she is in contact with could be a potential exposure due to nature of pandemic. She feels she wants to pursue other facility options. CSW made referrals and will follow up.   (CSW and pt also discussed possibility of returning home, however pt feels uncomfortable with caregivers entering her home given risk of exposure that way. )    Expected Discharge Plan: [place] Barriers to Discharge: Continued Medical Work up, SNF Pending bed offer  Expected Discharge Plan and Services Expected Discharge Plan:  [place] In-house Referral: Clinical Social Work   Post Acute Care Choice: [place] arrangements for the past 2 months: Single Family Home Expected Discharge Date: 12/14/18                                     Social Determinants of Health (SDOH) Interventions    Readmission Risk Interventions No flowsheet data found.

## 2018-12-14 NOTE — Discharge Instructions (Signed)
[facility], [place] [phone]  OPEN ABDOMINAL SURGERY: POST OP INSTRUCTIONS  Always review your discharge instruction sheet given to you by the facility where your surgery was performed.  IF YOU HAVE DISABILITY OR FAMILY LEAVE FORMS, YOU MUST BRING THEM TO THE OFFICE FOR PROCESSING.  PLEASE DO NOT GIVE THEM TO YOUR DOCTOR.  1. A prescription for pain medication may be given to you upon discharge.  Take your pain medication as prescribed, if needed.  If narcotic pain medicine is not needed, then you may take acetaminophen (Tylenol) or ibuprofen (Advil) as needed. 2. Take your usually prescribed medications unless otherwise directed. 3. If you need a refill on your pain medication, please contact your pharmacy. They will contact our office to request authorization.  Prescriptions will not be filled after 5pm or on week-ends. 4. You should follow a light diet the first few days after arrival home, such as soup and crackers, pudding, etc.unless your doctor has advised otherwise. A high-fiber, low fat diet can be resumed as tolerated.   Be sure to include lots of fluids daily. Most patients will experience some swelling and bruising on the chest and neck area.  Ice packs will help.  Swelling and bruising can take several days to resolve 5. Most patients will experience some swelling and bruising in the area of the incision. Ice pack will help. Swelling and bruising can take several days to resolve..  6. It is common to experience some constipation if taking pain medication after surgery.  Increasing fluid intake and taking a stool softener will usually help or prevent this problem from occurring.  A mild laxative (Milk of Magnesia or Miralax) should be taken according to package directions if there are no bowel movements after 48 hours. 7.  You may have steri-strips (small skin tapes) in place directly over the incision.  These strips should be left on the skin for 7-10  days.  If your surgeon used skin glue on the incision, you may shower in 24 hours.  The glue will flake off over the next 2-3 weeks.  Any sutures or staples will be removed at the office during your follow-up visit. You may find that a light gauze bandage over your incision may keep your staples from being rubbed or pulled. You may shower and replace the bandage daily. 8. ACTIVITIES:  You may resume regular (light) daily activities beginning the next day--such as daily self-care, walking, climbing stairs--gradually increasing activities as tolerated.  You may have sexual intercourse when it is comfortable.  Refrain from any heavy lifting or straining until approved by your doctor. a. You may drive when you no longer are taking prescription pain medication, you can comfortably wear a seatbelt, and you can safely maneuver your car and apply brakes b. Return to Work: ___________________________________ 62. You should see your doctor in the office for a follow-up appointment approximately two weeks after your surgery.  Make sure that you call for this appointment within a day or two after you arrive home to insure a convenient appointment time. OTHER INSTRUCTIONS:  _____________________________________________________________ _____________________________________________________________  WHEN TO CALL YOUR DOCTOR: 1. Fever over 101.0 2. Inability to urinate 3. Nausea and/or vomiting 4. Extreme swelling or bruising 5. Continued bleeding from incision. 6. Increased pain, redness, or drainage from the incision. 7. Difficulty swallowing or breathing 8. Muscle cramping or spasms. 9. Numbness or tingling in hands or feet or around lips.  The clinic staff is available to answer your questions during regular business hours.  Please dont hesitate to call and ask to speak to one of the nurses if you have concerns.  For further questions, please visit  [email or web address]             Managing Your Pain After Surgery Without Opioids    Thank you for participating in our program to help patients manage their pain after surgery without opioids. This is part of our effort to provide you with the best care possible, without exposing you or your family to the risk that opioids pose.  What pain can I expect after surgery? You can expect to have some pain after surgery. This is normal. The pain is typically worse the day after surgery, and quickly begins to get better. Many studies have found that many patients are able to manage their pain after surgery with Over-the-Counter (OTC) medications such as Tylenol and Motrin. If you have a condition that does not allow you to take Tylenol or Motrin, notify your surgical team.  How will I manage my pain? The best strategy for controlling your pain after surgery is around the clock pain control with Tylenol (acetaminophen) and Motrin (ibuprofen or Advil). Alternating these medications with each other allows you to maximize your pain control. In addition to Tylenol and Motrin, you can use heating pads or ice packs on your incisions to help reduce your pain.  How will I alternate your regular strength over-the-counter pain medication? You will take a dose of pain medication every three hours. ; Start by taking 650 mg of Tylenol (2 pills of 325 mg) ; 3 hours later take 600 mg of Motrin (3 pills of 200 mg) ; 3 hours after taking the Motrin take 650 mg of Tylenol ; 3 hours after that take 600 mg of Motrin.   - 1 -  See example - if your first dose of Tylenol is at 12:00 PM   12:00 PM Tylenol 650 mg (2 pills of 325 mg)  3:00 PM Motrin 600 mg (3 pills of 200 mg)  6:00 PM Tylenol 650 mg (2 pills of 325 mg)  9:00 PM Motrin 600 mg (3 pills of 200 mg)  Continue alternating every 3 hours   We recommend that you follow this schedule around-the-clock for at least 3 days after  surgery, or until you feel that it is no longer needed. Use the table on the last page of this handout to keep track of the medications you are taking. Important: Do not take more than 3000mg  of Tylenol or 3200mg  of Motrin in a 24-hour period. Do not take ibuprofen/Motrin if you have a history of bleeding stomach ulcers, severe kidney disease, &/or actively taking a blood thinner  What if I still have pain? If you have pain that is not controlled with the over-the-counter pain medications (Tylenol and Motrin or Advil) you might have what we call breakthrough pain. You will receive a prescription for a small amount of an opioid pain medication such as Oxycodone, Tramadol, or Tylenol with Codeine. Use these opioid pills in the first 24 hours after surgery if you have breakthrough pain. Do not take more than 1 pill every 4-6 hours.  If you still have uncontrolled pain after using all opioid pills, don't hesitate to call our staff using the number provided. We will help make sure you are managing your pain in the best way possible, and if necessary, we can provide a  prescription for additional pain medication.   Day 1    Time  Name of Medication Number of pills taken  Amount of Acetaminophen  Pain Level   Comments  AM PM       AM PM       AM PM       AM PM       AM PM       AM PM       AM PM       AM PM       Total Daily amount of Acetaminophen Do not take more than  3,000 mg per day      Day 2    Time  Name of Medication Number of pills taken  Amount of Acetaminophen  Pain Level   Comments  AM PM       AM PM       AM PM       AM PM       AM PM       AM PM       AM PM       AM PM       Total Daily amount of Acetaminophen Do not take more than  3,000 mg per day      Day 3    Time  Name of Medication Number of pills taken  Amount of Acetaminophen  Pain Level   Comments  AM PM       AM PM       AM PM       AM PM          AM PM       AM PM       AM PM        AM PM       Total Daily amount of Acetaminophen Do not take more than  3,000 mg per day      Day 4    Time  Name of Medication Number of pills taken  Amount of Acetaminophen  Pain Level   Comments  AM PM       AM PM       AM PM       AM PM       AM PM       AM PM       AM PM       AM PM       Total Daily amount of Acetaminophen Do not take more than  3,000 mg per day      Day 5    Time  Name of Medication Number of pills taken  Amount of Acetaminophen  Pain Level   Comments  AM PM       AM PM       AM PM       AM PM       AM PM       AM PM       AM PM       AM PM       Total Daily amount of Acetaminophen Do not take more than  3,000 mg per day       Day 6    Time  Name of Medication Number of pills taken  Amount of Acetaminophen  Pain Level  Comments  AM PM       AM PM       AM PM  AM PM       AM PM       AM PM       AM PM       AM PM       Total Daily amount of Acetaminophen Do not take more than  3,000 mg per day      Day 7    Time  Name of Medication Number of pills taken  Amount of Acetaminophen  Pain Level   Comments  AM PM       AM PM       AM PM       AM PM       AM PM       AM PM       AM PM       AM PM       Total Daily amount of Acetaminophen Do not take more than  3,000 mg per day        For additional information about how and where to safely dispose of unused opioid medications - [employer]  Disclaimer: This document contains information and/or instructional materials adapted from [place] for the typical patient with your condition. It does not replace medical advice from your health care provider because your experience may differ from that of the typical patient. Talk to your health care provider if you have any questions about this document, your condition or your treatment plan. Adapted from [place]    Additional instructions from surgery  1)   stay  well-hydrated. 2) high-fiber, low-fat diet 3) take a multivitamin with iron every day 4)   you may walk up stairs and walk an unlimited amount 5) no lifting more than 10 pounds for 6 weeks 6) no driving for 1 month 7) make an appointment to see Dr. Darrel Hoover office staff in 1 week to have the staples removed 8)   make an appointment to see Dr. Dalbert Batman in about 3 weeks. 9) call Dr. Darrel Hoover office if you have any problems with fever over 100.5, vomiting, drainage from the wound, dizziness.

## 2018-12-14 NOTE — Progress Notes (Addendum)
6 Days Post-Op  Subjective: She has improved and now meets all discharge criteria. Tolerating solid diet. Passing lots of flatus and semi-liquid stool but only 2 or 3 stools per day She says the abdominal bloating has resolved She has no pain, or very little Ambulated in hall yesterday  Hopefully arrangements for disposition to SNF at [facility] can be completed today [place] 2 form signed.  Awaiting confirmation   Objective: Vital signs in last 24 hours: Temp:  [97.8 F (36.6 C)-98.2 F (36.8 C)] 98.1 F (36.7 C) (10/01 0546) Pulse Rate:  [85-92] 86 (10/01 0546) Resp:  [16-19] 16 (10/01 0546) BP: (132-139)/(56-81) 139/63 (10/01 0546) SpO2:  [96 %-100 %] 100 % (10/01 0546) Last BM Date: 12/12/18  Intake/Output from previous day: 09/30 0701 - 10/01 0700 In: 480 [P.O.:480] Out: 1 [Urine:1] Intake/Output this shift: No intake/output data recorded.   EXAM: General appearance: Alert and oriented.    Mental status normal.  Very sharp with good insight.  Does not appear to be in any distress.  Somewhat deconditioned Resp: clear to auscultation bilaterally GI: Soft.  Distention seems to be resolved. No peritoneal signs.  Tenderness minimal..  Midline incision clean and dry. Extremities: extremities normal, atraumatic, no cyanosis or edema    Lab Results:  No results found for this or any previous visit (from the past 24 hour(s)).   Studies/Results: No results found.  Marland Kitchen acetaminophen  1,000 mg Oral TID  . amLODipine  5 mg Oral Daily  . arformoterol  15 mcg Nebulization BID  . calcium carbonate  1 tablet Oral Q breakfast  . chlorhexidine  15 mL Mouth Rinse BID  . enoxaparin (LOVENOX) injection  30 mg Subcutaneous Q24H  . feeding supplement  237 mL Oral BID BM  . furosemide  20 mg Oral q morning - 10a  . lip balm  1 application Topical BID  . mouth rinse  15 mL Mouth Rinse q12n4p  . metoprolol tartrate  12.5 mg Oral BID  . 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 -POD #6. Laparotomy, right colon and distal ileum resection with primary anastomosis, repair serosal injury sigmoid colon,placement right ureteral stent, now removed. -Her condition has improved and she now meets all discharge criteria.  It is safe to discharge from acute care and transfer to SNF at [facility] today, if arrangements can be made.  I have discussed this with the patient's RN who will try to communicate this to case management to see if this can be accomplished today. -Appreciate input from OT, PT, case management. -DVT prophylaxis with Lovenox- -we will see her back in my office in 1 week for staple removal and wound check. -I will then see her back about 2 weeks after that -She does not need any new prescriptions for me.  She does not need any prescription strength pain medicine.  Will leave pain management instructions in her AVS -I advised her to see her PCP, Dr. Sallee Lange, within 7 to 10 days.  This is to review her medical problems and medication profile. -Outpatient follow-up with medical oncology.(Dr. Delton Coombes) -Consider genetics -We will need CT chest to complete staging work-up.  This can be done as outpatient -considering advanced age it is likely that risk would outweighbenefit from adjuvant therapies.  -Diarrhea.  Physiologic secondary to resection right  colon, ileocecal valve, and 2 to 3 feet of small bowel.  Likely this will resolve over time.  Consider Questran if persists.  Anemia requiring transfusion.This is acute blood loss anemia superimposed on chronic anemia. -Hemoglobin 9.69/28.Marland Kitchen No indication for transfusion  COPD. Moderately severe but seems stable.Followed by Dr. Rebecca Eaton outpatient. -Currently seems stable on  nebulizers. -Appreciate internal medicine management  Right breast cancer.Status post right lumpectomy May 23, 2015 for stage I a invasive ductal carcinoma, receptor positive, HER-2 negative. She completed radiation therapy and currently tolerating anastrozole with no evidence of disease  Urothelial carcinoma left ureter, status post robotic resection distal left ureter with reimplantation February 2020 no evidence of hydronephrosis on either side  Lung nodules  Hypertension  CKD 4 -Creatinine stable.  1.47 this morning.  Baseline.   Hypokalemia-K+ 4.0, 3.7.  Considering use of Lasix and loose stools, perhaps she should be on a daily dose of potassium supplement.  Defer to internal medicine  DM2 -CBG 86 this morning.  Disposition. -She lives alone and is independent. She has no children. She has a cousin in the area. She is now in agreement for SNF and prefers [facility] -[place] 2 signed.  Placement in progress. -[facility] case management involved -Inpatient social work also involved -PT and OT remain involved while inpatient.  PT and OT will continue him at [facility]  -She meets all discharge criteria (from a surgical standpoint)  and is ready for discharge today.  I discussed this with her bedside RN who will try to facilitate transition of care. -Will need approval from her admitting/managing service, [facility]. -Follow-up with me in 1 week for wound check and staple removal -Follow-up with PCP, Dr. Sallee Lange within 1 week.  Hopefully comprehensive management of her multiple medical problems will reduce readmission rate -See Dr. Delton Coombes in 2 weeks or so to continue staging work-up and decision making regarding adjuvant interventions versus surveillance -Discharge instructions and follow-up is loaded into her after visit summary. -No new prescriptions from me      _0 @  LOS: 9 days    Adin Hector 12/14/2018  . .prob

## 2018-12-14 NOTE — Progress Notes (Signed)
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 Ronne Binning, hypertension, COPD/emphysema who has home 02 for PRN use,CKD -IV with baseline creatinine around 1.9 and GFR 20-24 presented to [facility] with c/o lower abdominal pain, fatigue and noted to have Hgb of 5.5 at PCP office and sent to ED.  CT abdomen and pelvis revealed adenocarcinoma of the colon with nodal metastasis.  She received 2 units of RBC and admitted to hospitalist service with GI, GS and oncology consultation.  Underwent colonoscopy on 9/23 that revealed large ulcerated mass at the ascending colon, terminal ileum and cecum.  Pathology consistent with adenocarcinoma.  CT of the chest planned as outpatient to help adequate staging of her malignancy through her primary oncologist Dr. Ellin Saba in [place].  General surgery, Dr. Elroy Channel consulted for tumor resection and patient transferred to [facility] long.  Patient had partial colectomy by general surgery, & intraoperative cystoscopy and placement of right ureteral stent by urology on 9/25.  Patient had uncomplicated postoperative course.  Evaluated by PT/OT who recommended SNF.  Subjective: No major events overnight of this morning.  No complaint this morning.  Sitting in bed eating breakfast.  Denies chest pain, dyspnea, GI or GU symptoms.  Having bowel movements and passing gases.  Objective: Vitals:   12/13/18 1933 12/13/18 2035 12/14/18 0546 12/14/18 0906  BP:  (!) 137/56 139/63   Pulse:  92 86   Resp:  19 16   Temp:  98.2 F (36.8 C) 98.1 F (36.7 C)   TempSrc:  Oral Oral   SpO2: 98% 98% 100% 98%  Weight:      Height:        Intake/Output Summary  (Last 24 hours) at 12/14/2018 1351 Last data filed at 12/14/2018 0917 Gross per 24 hour  Intake 123 ml  Output -  Net 123 ml   Filed Weights   12/10/18 0500 12/11/18 0533 12/13/18 0500  Weight: 63.8 kg 62.1 kg 62.8 kg    Examination: GENERAL: No acute distress.  Appears well.  HEENT: MMM.  Vision and hearing grossly intact.  NECK: Supple.  No apparent JVD.  RESP:  No IWOB. Good air movement bilaterally. CVS:  RRR. Heart sounds normal.  ABD/GI/GU: Bowel sounds present. Soft. Non tender.  MSK/EXT:  Moves extremities. No apparent deformity or edema.  SKIN: Surgical wound appears clean and dry. NEURO: Awake, alert and oriented appropriately.  No gross deficit.  PSYCH: Calm. Normal affect.   Assessment & Plan: Locally advanced adenocarcinoma of the cecum and terminal ileum -s/p laparotomy, right colon and distal ileum resection with primary anastomosis -Pathology revealed adenocarcinoma, moderately differentiated.  No carcinoma identified in 16 lymph nodes. -Outpatient follow-up with general surgery for staple removal. -Patient to follow-up with her oncologist, Dr. Ellin Saba for further evaluation and treatment.  Needs CT chest to complete staging work-up -Final disposition SNF  Transitional cell carcinoma of left ureter -s/p robotic resection of distal left ureter with reimplantation by Dr. Ronne Binning in 04/2018 -Intraoperative cystoscopy and placement of right ureteral stent by urology 9/25  History of right breast cancer -Right lumpectomy/radiation in 2017, 0.7 cm IDC, grade 1, ER positive, PR negative, HER-2 negative. -Continued on anastrozole at this time.  -Mammogram on 09/19/2018 was BI-RADS Category 2. -  Seems stable at this time  Acute on chronic blood loss anemia: Likely due to the above. -H&H stable after 2 units at [facility]  Chronic COPD: On PRN oxygen at home.  Stable. -Continue PRN nebulizers  CKD-4: Stable -Avoid nephrotoxic meds. -Stop Lasix.  No history of CHF.  No  edema.  Essential hypertension: Normotensive -Continue amlodipine and PRN labetalol. -Stop Lasix.  Asymptomatic bacteriuria: urine culture grew Klebsiella oxytoca on 9/25.  DVT prophylaxis: Subcu Lovenox Code Status: Full code Family Communication: Patient and/or RN. Available if any question.  Disposition Plan: Medically stable for discharge to SNF.  Waiting on insurance authorization. Consultants: Urology, general surgery  Procedures:   Open laparotomy with lysis of adhesions, R colectomy, small bowel resection, ileocolic anastomosis, repair of serosal injury sigmoid colon - 9/25  Cystoscopy with R ureteral stent placement - 9/25  Microbiology summarized: -COVID-19 negative on 9/21 and 9/29 -MRSA PCR negative. -Urine culture Klebsiella oxytoca.   Antimicrobials: Anti-infectives (From admission, onward)   Start     Dose/Rate Route Frequency Ordered Stop   12/09/18 0130  cefoTEtan (CEFOTAN) 2 g in sodium chloride 0.9 % 100 mL IVPB     2 g 200 mL/hr over 30 Minutes Intravenous Every 12 hours 12/08/18 1740 12/09/18 0049   12/08/18 0645  cefoTEtan (CEFOTAN) 2 g in sodium chloride 0.9 % 100 mL IVPB     2 g 200 mL/hr over 30 Minutes Intravenous On call to O.R. 12/08/18 1601 12/08/18 1337   12/08/18 0645  clindamycin (CLEOCIN) 900 mg, gentamicin (GARAMYCIN) 240 mg in sodium chloride 0.9 % 1,000 mL for intraperitoneal lavage  Status:  Discontinued      Irrigation To Surgery 12/08/18 0634 12/08/18 1729      Sch Meds:  Scheduled Meds: . acetaminophen  1,000 mg Oral TID  . amLODipine  5 mg Oral Daily  . arformoterol  15 mcg Nebulization BID  . calcium carbonate  1 tablet Oral Q breakfast  . chlorhexidine  15 mL Mouth Rinse BID  . enoxaparin (LOVENOX) injection  30 mg Subcutaneous Q24H  . feeding supplement  237 mL Oral BID BM  . furosemide  20 mg Oral q morning - 10a  . lip balm  1 application Topical BID  . mouth rinse  15 mL Mouth Rinse q12n4p  . metoprolol tartrate  12.5  mg Oral BID  . sodium chloride flush  3 mL Intravenous Q12H   Continuous Infusions: . sodium chloride    . methocarbamol (ROBAXIN) IV     PRN Meds:.sodium chloride, alum & mag hydroxide-simeth, fluticasone, guaiFENesin-dextromethorphan, hydrocortisone, hydrocortisone cream, HYDROmorphone (DILAUDID) injection, ipratropium-albuterol, labetalol, magic mouthwash, menthol-cetylpyridinium, methocarbamol (ROBAXIN) IV, naphazoline-glycerin, ondansetron **OR** ondansetron (ZOFRAN) IV, ondansetron **OR** [DISCONTINUED] ondansetron (ZOFRAN) IV, phenol, simethicone, sodium chloride flush, traMADol   I have personally reviewed the following labs and images: CBC: Recent Labs  Lab 12/08/18 0541 12/08/18 1636 12/09/18 0158 12/10/18 0151 12/12/18 0548 12/13/18 0638  WBC 11.1* 16.5* 15.8* 11.9* 7.2 5.7  NEUTROABS 8.8*  --   --   --   --   --   HGB 8.0* 10.5* 10.3* 9.5* 9.6* 9.7*  HCT 26.6* 35.1* 33.3* 30.7* 31.2* 31.8*  MCV 94.3 95.9 93.8 91.4 91.8 93.3  PLT 347 355 350 349 351 365   BMP &GFR Recent Labs  Lab 12/09/18 0158 12/10/18 0151 12/11/18 0556 12/12/18 0548 12/13/18 0638  NA 139 139 140 140 140  K 4.5 3.7 3.3* 4.0 3.7  CL 112* 109 110 112* 111  CO2  18* 18* 17* 11* 21*  GLUCOSE 179* 98 83 108* 92  BUN 17 15 14 16 19   CREATININE 1.77* 1.66* 1.42* 1.42* 1.47*  CALCIUM 7.2* 7.1* 7.0* 7.7* 7.8*   Estimated Creatinine Clearance: 23.3 mL/min (A) (by C-G formula based on SCr of 1.47 mg/dL (H)). Liver & Pancreas: Recent Labs  Lab 12/09/18 0158  AST 26  ALT 28  ALKPHOS 79  BILITOT 0.3  PROT 5.9*  ALBUMIN 2.8*   No results for input(s): LIPASE, AMYLASE in the last 168 hours. No results for input(s): AMMONIA in the last 168 hours. Diabetic: No results for input(s): HGBA1C in the last 72 hours. Recent Labs  Lab 12/10/18 1216 12/10/18 1542 12/11/18 0750 12/12/18 0519 12/13/18 0503  GLUCAP 137* 86 98 105* 88   Cardiac Enzymes: No results for input(s): CKTOTAL, CKMB,  CKMBINDEX, TROPONINI in the last 168 hours. No results for input(s): PROBNP in the last 8760 hours. Coagulation Profile: No results for input(s): INR, PROTIME in the last 168 hours. Thyroid Function Tests: No results for input(s): TSH, T4TOTAL, FREET4, T3FREE, THYROIDAB in the last 72 hours. Lipid Profile: No results for input(s): CHOL, HDL, LDLCALC, TRIG, CHOLHDL, LDLDIRECT in the last 72 hours. Anemia Panel: No results for input(s): VITAMINB12, FOLATE, FERRITIN, TIBC, IRON, RETICCTPCT in the last 72 hours. Urine analysis:    Component Value Date/Time   COLORURINE YELLOW 12/30/2017 1039   APPEARANCEUR CLEAR 12/30/2017 1039   LABSPEC 1.012 12/30/2017 1039   PHURINE 5.0 12/30/2017 1039   GLUCOSEU NEGATIVE 12/30/2017 1039   HGBUR SMALL (A) 12/30/2017 1039   BILIRUBINUR NEGATIVE 12/30/2017 1039   KETONESUR NEGATIVE 12/30/2017 1039   PROTEINUR NEGATIVE 12/30/2017 1039   NITRITE NEGATIVE 12/30/2017 1039   LEUKOCYTESUR NEGATIVE 12/30/2017 1039   Sepsis Labs: Invalid input(s): PROCALCITONIN, LACTICIDVEN  Microbiology: Recent Results (from the past 240 hour(s))  SARS CORONAVIRUS 2 (TAT 6-24 HRS) Nasopharyngeal Nasopharyngeal Swab     Status: None   Collection Time: 12/04/18  4:39 PM   Specimen: Nasopharyngeal Swab  Result Value Ref Range Status   SARS Coronavirus 2 NEGATIVE NEGATIVE Final    Comment: (NOTE) SARS-CoV-2 target nucleic acids are NOT DETECTED. The SARS-CoV-2 RNA is generally detectable in upper and lower respiratory specimens during the acute phase of infection. Negative results do not preclude SARS-CoV-2 infection, do not rule out co-infections with other pathogens, and should not be used as the sole basis for treatment or other patient management decisions. Negative results must be combined with clinical observations, patient history, and epidemiological information. The expected result is Negative. Fact Sheet for Patients:  HairSlick.no Fact Sheet for Healthcare Providers: quierodirigir.com This test is not yet approved or cleared by the [place] FDA and  has been authorized for detection and/or diagnosis of SARS-CoV-2 by FDA under an Emergency Use Authorization (EUA). This EUA will remain  in effect (meaning this test can be used) for the duration of the COVID-19 declaration under Section 56 4(b)(1) of the Act, 21 U.S.C. section 360bbb-3(b)(1), unless the authorization is terminated or revoked sooner. Performed at [facility], [address], [place], [place] [address]   Surgical pcr screen     Status: None   Collection Time: 12/08/18  6:21 AM   Specimen: Nasal Mucosa; Nasal Swab  Result Value Ref Range Status   MRSA, PCR NEGATIVE NEGATIVE Final   Staphylococcus aureus NEGATIVE NEGATIVE Final    Comment: (NOTE) The Xpert SA Assay (FDA approved for NASAL specimens in patients 22 years of  age and older), is one component of a comprehensive surveillance program. It is not intended to diagnose infection nor to guide or monitor treatment. Performed at [facility], [address] [place], [place] [address]   Urine Culture     Status: Abnormal   Collection Time: 12/08/18  2:07 PM   Specimen: Urine, Random  Result Value Ref Range Status   Specimen Description   Final    URINE, RANDOM Performed at [facility], [address], [place], [place] [address]    Special Requests   Final    NONE Performed at [facility], [address], [place], [place] [address]    Culture >=100,000 COLONIES/mL KLEBSIELLA OXYTOCA (A)  Final   Report Status 12/10/2018 FINAL  Final   Organism ID, Bacteria KLEBSIELLA OXYTOCA (A)  Final      Susceptibility   Klebsiella oxytoca - MIC*    AMPICILLIN RESISTANT Resistant     CEFAZOLIN >=64 RESISTANT Resistant     CEFTRIAXONE <=1 SENSITIVE Sensitive     CIPROFLOXACIN  <=0.25 SENSITIVE Sensitive     GENTAMICIN <=1 SENSITIVE Sensitive     IMIPENEM <=0.25 SENSITIVE Sensitive     NITROFURANTOIN 32 SENSITIVE Sensitive     TRIMETH/SULFA <=20 SENSITIVE Sensitive     AMPICILLIN/SULBACTAM 4 SENSITIVE Sensitive     PIP/TAZO <=4 SENSITIVE Sensitive     Extended ESBL NEGATIVE Sensitive     * >=100,000 COLONIES/mL KLEBSIELLA OXYTOCA  Novel Coronavirus, NAA (Hosp order, Send-out to Ref Lab; TAT 18-24 hrs     Status: None   Collection Time: 12/12/18  4:00 PM   Specimen: Nasopharyngeal Swab; Respiratory  Result Value Ref Range Status   SARS-CoV-2, NAA NOT DETECTED NOT DETECTED Final    Comment: (NOTE) This nucleic acid amplification test was developed and its performance characteristics determined by [employer]. Nucleic acid amplification tests include PCR and TMA. This test has not been FDA cleared or approved. This test has been authorized by FDA under an Emergency Use Authorization (EUA). This test is only authorized for the duration of time the declaration that circumstances exist justifying the authorization of the emergency use of in vitro diagnostic tests for detection of SARS-CoV-2 virus and/or diagnosis of COVID-19 infection under section 564(b)(1) of the Act, 21 U.S.C. 401UUV-2(Z) (1), unless the authorization is terminated or revoked sooner. When diagnostic testing is negative, the possibility of a false negative result should be considered in the context of a patient's recent exposures and the presence of clinical signs and symptoms consistent with COVID-19. An individual without symptoms of COVID- 19 and who is not shedding SARS-CoV-2 vi rus would expect to have a negative (not detected) result in this assay. Performed At: [facility] [address] [place], [place] [id number] Jolene Schimke MD QQ:[id number]    Coronavirus Source NASOPHARYNGEAL  Final    Comment: Performed at [facility], [address] [place], [place] [address]    Radiology Studies: No results found.   Preciliano Castell T. Nour Rodrigues [facility]  If 7PM-7AM, please contact night-coverage [email or web address] Password [email or web address] 12/14/2018, 1:51 PM

## 2018-12-14 NOTE — Progress Notes (Signed)
Occupational Therapy Treatment Patient Details Name: Lauren INSCO MRN: [id number] DOB: Jun 22, 1928 Today's Date: 12/14/2018    History of present illness 85 yo female admitted to ED on 9/21 with anemia, colonic adenocarcinoma. Pt with history of urothelial carcinoma with lap nephroureterectomy2/2020 and now s/p cystoscopy and R ureteral stent placement 9/25; open laparoscopy, lysis of adhesions, R colectomy, SB resection, ileocolic anastomosis, repair of serosal injury of sigmoid colon on 9/25. PMH includes breast cancer, COPD, DM, HTN, OA, osteoporosis, peripheral neuropathy.   OT comments  Pt initially requiring encouragement to participate in therapy session. Pt engaging in seated UB/LB exercise for increasing strength/endurance as precursor to ADL/functional transfers. Pt performing sit<>stand and short distance mobility in room using RW with close minguard-minA throughout. Engaged in additional seated grooming ADL with setup assist. Pt expressing concerns about going to [facility] as she reports center now has Covid cases. Continue to recommend SNF at time of discharge. Will follow acutely.   Follow Up Recommendations  SNF    Equipment Recommendations  None recommended by OT          Precautions / Restrictions Precautions Precautions: Fall Precaution Comments: abdominal Restrictions Weight Bearing Restrictions: No       Mobility Bed Mobility Overal bed mobility: Needs Assistance Bed Mobility: Rolling;Sidelying to Sit;Sit to Sidelying Rolling: Min guard Sidelying to sit: Min guard     Sit to sidelying: Min assist General bed mobility comments: use of bedrail to assist, increased time to perform and cues to log roll when transitioning to sitting  Transfers Overall transfer level: Needs assistance Equipment used: Rolling walker (2 wheeled) Transfers: Sit to/from Stand Sit to Stand: Min guard         General transfer comment: x 4 (did sit to stand for  strengthening). VCs safety, hand placement. Increased time.    Balance Overall balance assessment: Needs assistance Sitting-balance support: No upper extremity supported Sitting balance-Leahy Scale: Good     Standing balance support: Bilateral upper extremity supported Standing balance-Leahy Scale: Poor Standing balance comment: reliant on RW for support in stanidng                           ADL either performed or assessed with clinical judgement   ADL Overall ADL's : Needs assistance/impaired     Grooming: Set up;Sitting;Bed level;Wash/dry face                               Functional mobility during ADLs: Min guard;Minimal assistance;Rolling walker General ADL Comments: pt initially declining performing ADL, agreeable to EOB UB/LB exercise and was more agreable to performing grooming ADL end of session     Vision       Perception     Praxis      Cognition Arousal/Alertness: Awake/alert Behavior During Therapy: WFL for tasks assessed/performed Overall Cognitive Status: Within Functional Limits for tasks assessed                                          Exercises Exercises: General Upper Extremity;General Lower Extremity;Other exercises General Exercises - Upper Extremity Shoulder Flexion: AROM;Both;10 reps;Seated Elbow Flexion: AROM;Both;10 reps;Seated Elbow Extension: AROM;Both;10 reps;Seated General Exercises - Lower Extremity Ankle Circles/Pumps: AROM;Both;10 reps;Seated Short Arc Quad: AROM;Both;10 reps;Seated Hip Flexion/Marching: AROM;Both;10 reps;Seated Other Exercises Other Exercises: sit<>stand x4  Shoulder Instructions       General Comments      Pertinent Vitals/ Pain       Pain Assessment: Faces Faces Pain Scale: Hurts little more Pain Location: abdomen Pain Descriptors / Indicators: Discomfort;Sore Pain Intervention(s): Limited activity within patient's tolerance;Monitored during  session;Repositioned  Home Living                                          Prior Functioning/Environment              Frequency  Min 2X/week        Progress Toward Goals  OT Goals(current goals can now be found in the care plan section)  Progress towards OT goals: Progressing toward goals  Acute Rehab OT Goals Patient Stated Goal: get well then go home OT Goal Formulation: With patient Time For Goal Achievement: 12/25/18 ADL Goals Pt Will Perform Grooming: with modified independence;standing Pt Will Perform Lower Body Dressing: with modified independence;sit to/from stand Pt Will Transfer to Toilet: with modified independence;regular height toilet Pt Will Perform Toileting - Clothing Manipulation and hygiene: with modified independence;sit to/from stand  Plan Discharge plan remains appropriate    Co-evaluation                 AM-PAC OT "6 Clicks" Daily Activity     Outcome Measure   Help from another person eating meals?: None Help from another person taking care of personal grooming?: A Little Help from another person toileting, which includes using toliet, bedpan, or urinal?: A Lot Help from another person bathing (including washing, rinsing, drying)?: A Lot Help from another person to put on and taking off regular upper body clothing?: A Little Help from another person to put on and taking off regular lower body clothing?: A Lot 6 Click Score: 16    End of Session Equipment Utilized During Treatment: Rolling walker  OT Visit Diagnosis: Unsteadiness on feet (R26.81);History of falling (Z91.81);Muscle weakness (generalized) (M62.81);Other abnormalities of gait and mobility (R26.89)   Activity Tolerance Patient tolerated treatment well   Patient Left in bed;with call bell/phone within reach;with bed alarm set   Nurse Communication Mobility status        Time: 1045-1101 OT Time Calculation (min): 16 min  Charges: OT General  Charges $OT Visit: 1 Visit OT Treatments $Therapeutic Activity: 8-22 mins  Lou Cal, OT Supplemental Rehabilitation Services Pager [phone] Office [place] 12/14/2018, 11:12 AM

## 2018-12-15 ENCOUNTER — Telehealth: Payer: Self-pay | Admitting: Family Medicine

## 2018-12-15 DIAGNOSIS — C172 Malignant neoplasm of ileum: Secondary | ICD-10-CM | POA: Diagnosis not present

## 2018-12-15 DIAGNOSIS — Z800 Family history of malignant neoplasm of digestive organs: Secondary | ICD-10-CM | POA: Diagnosis not present

## 2018-12-15 DIAGNOSIS — Z923 Personal history of irradiation: Secondary | ICD-10-CM | POA: Diagnosis not present

## 2018-12-15 DIAGNOSIS — C50911 Malignant neoplasm of unspecified site of right female breast: Secondary | ICD-10-CM | POA: Diagnosis not present

## 2018-12-15 DIAGNOSIS — F329 Major depressive disorder, single episode, unspecified: Secondary | ICD-10-CM | POA: Diagnosis not present

## 2018-12-15 DIAGNOSIS — Z9049 Acquired absence of other specified parts of digestive tract: Secondary | ICD-10-CM | POA: Diagnosis not present

## 2018-12-15 DIAGNOSIS — E119 Type 2 diabetes mellitus without complications: Secondary | ICD-10-CM | POA: Diagnosis not present

## 2018-12-15 DIAGNOSIS — Z87891 Personal history of nicotine dependence: Secondary | ICD-10-CM | POA: Diagnosis not present

## 2018-12-15 DIAGNOSIS — M8580 Other specified disorders of bone density and structure, unspecified site: Secondary | ICD-10-CM | POA: Diagnosis not present

## 2018-12-15 DIAGNOSIS — Z79899 Other long term (current) drug therapy: Secondary | ICD-10-CM | POA: Diagnosis not present

## 2018-12-15 DIAGNOSIS — R278 Other lack of coordination: Secondary | ICD-10-CM | POA: Diagnosis not present

## 2018-12-15 DIAGNOSIS — R2681 Unsteadiness on feet: Secondary | ICD-10-CM | POA: Diagnosis not present

## 2018-12-15 DIAGNOSIS — T7840XD Allergy, unspecified, subsequent encounter: Secondary | ICD-10-CM | POA: Diagnosis not present

## 2018-12-15 DIAGNOSIS — D62 Acute posthemorrhagic anemia: Secondary | ICD-10-CM | POA: Diagnosis not present

## 2018-12-15 DIAGNOSIS — M6281 Muscle weakness (generalized): Secondary | ICD-10-CM | POA: Diagnosis not present

## 2018-12-15 DIAGNOSIS — Z170 Estrogen receptor positive status [ER+]: Secondary | ICD-10-CM | POA: Diagnosis not present

## 2018-12-15 DIAGNOSIS — D500 Iron deficiency anemia secondary to blood loss (chronic): Secondary | ICD-10-CM | POA: Diagnosis not present

## 2018-12-15 DIAGNOSIS — C189 Malignant neoplasm of colon, unspecified: Secondary | ICD-10-CM | POA: Diagnosis not present

## 2018-12-15 DIAGNOSIS — N184 Chronic kidney disease, stage 4 (severe): Secondary | ICD-10-CM | POA: Diagnosis not present

## 2018-12-15 DIAGNOSIS — Z743 Need for continuous supervision: Secondary | ICD-10-CM | POA: Diagnosis not present

## 2018-12-15 DIAGNOSIS — R41841 Cognitive communication deficit: Secondary | ICD-10-CM | POA: Diagnosis not present

## 2018-12-15 DIAGNOSIS — C50919 Malignant neoplasm of unspecified site of unspecified female breast: Secondary | ICD-10-CM | POA: Diagnosis not present

## 2018-12-15 DIAGNOSIS — J449 Chronic obstructive pulmonary disease, unspecified: Secondary | ICD-10-CM | POA: Diagnosis not present

## 2018-12-15 DIAGNOSIS — Z8554 Personal history of malignant neoplasm of ureter: Secondary | ICD-10-CM | POA: Diagnosis not present

## 2018-12-15 DIAGNOSIS — I10 Essential (primary) hypertension: Secondary | ICD-10-CM | POA: Diagnosis not present

## 2018-12-15 DIAGNOSIS — I2510 Atherosclerotic heart disease of native coronary artery without angina pectoris: Secondary | ICD-10-CM | POA: Diagnosis not present

## 2018-12-15 DIAGNOSIS — Z90710 Acquired absence of both cervix and uterus: Secondary | ICD-10-CM | POA: Diagnosis not present

## 2018-12-15 DIAGNOSIS — R279 Unspecified lack of coordination: Secondary | ICD-10-CM | POA: Diagnosis not present

## 2018-12-15 DIAGNOSIS — R531 Weakness: Secondary | ICD-10-CM | POA: Diagnosis not present

## 2018-12-15 DIAGNOSIS — C180 Malignant neoplasm of cecum: Secondary | ICD-10-CM | POA: Diagnosis not present

## 2018-12-15 DIAGNOSIS — Z48815 Encounter for surgical aftercare following surgery on the digestive system: Secondary | ICD-10-CM | POA: Diagnosis not present

## 2018-12-15 DIAGNOSIS — Z79811 Long term (current) use of aromatase inhibitors: Secondary | ICD-10-CM | POA: Diagnosis not present

## 2018-12-15 DIAGNOSIS — Z853 Personal history of malignant neoplasm of breast: Secondary | ICD-10-CM | POA: Diagnosis not present

## 2018-12-15 LAB — CREATININE, SERUM
Creatinine, Ser: 1.56 mg/dL — ABNORMAL HIGH (ref 0.44–1.00)
GFR calc Af Amer: 34 mL/min — ABNORMAL LOW (ref >60)
GFR calc non Af Amer: 29 mL/min — ABNORMAL LOW (ref >60)

## 2018-12-15 LAB — GLUCOSE, CAPILLARY: Glucose-Capillary: 126 mg/dL — ABNORMAL HIGH (ref 70–99)

## 2018-12-15 MED ORDER — MULTI-VITAMIN/MINERALS PO TABS: 1.0000 | ORAL_TABLET | Freq: Every day | ORAL | 2 refills | Status: AC

## 2018-12-15 NOTE — TOC Transition Note (Signed)
Transition of Care [facility]) - CM/SW Discharge Note   Patient Details  Name: [name] MRN: [id number] Date of Birth: 1929-03-09  Transition of Care [facility]) CM/SW Contact:  Nila Nephew, LCSW Phone Number: [phone] 12/15/2018, 2:51 PM   Clinical Narrative:   Pt selected for admission at [place] room #311B report [phone] Pt informing her contacts (no close family, but she will let her cousin, PCP, and friends know) Will arrange PTAR transportation  Final next level of care: Skilled Nursing Facility Barriers to Discharge: Barriers Resolved   Patient Goals and CMS Choice Patient states their goals for this hospitalization and ongoing recovery are:: "get the best care I can and be able to go back home" CMS Medicare.gov Compare Post Acute Care list provided to:: Patient Choice offered to / list presented to : Patient  Discharge Placement PASRR number recieved: 12/13/18            Patient chooses bed at: [place] Patient to be transferred to facility by: [place] Name of family member notified: pt informing her cousin and friends Patient and family notified of of transfer: 12/15/18  Discharge Plan and Services In-house Referral: Clinical Social Work   Post Acute Care Choice: Learned                               Social Determinants of Health (SDOH) Interventions     Readmission Risk Interventions No flowsheet data found.

## 2018-12-15 NOTE — Progress Notes (Signed)
Patient IV to left wrist 22 gauge removed per MD orders for discharge, pressure applied to site for 2 minutes, gauze applied and tape.

## 2018-12-15 NOTE — Discharge Summary (Signed)
Physician Discharge Summary  [name] JJH:[id number] DOB: [birth year] DOA: 12/04/2018  PCP: Kathyrn Drown, MD  Admit date: 12/04/2018 Discharge date: 12/15/2018  Admitted From: Home Disposition: SNF  Recommendations for Outpatient Follow-up:  1. Follow up with PCP/general surgery/oncology in 1-2 weeks 2. Follow-up with urology in 3 to 4 weeks. 3. Please obtain CBC/BMP/Mag at follow up 4. 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 Alyson Ingles, hypertension, COPD/emphysema who has home 02 for PRN use,CKD -IV with baseline creatinine around 1.9 and GFR 20-24 presented to [facility] with c/o lower abdominal pain, fatigue and noted to have Hgb of 5.5 at PCP office and sent to ED.  CT abdomen and pelvis revealed adenocarcinoma of the colon with nodal metastasis.  She received 2 units of RBC and admitted to hospitalist service with GI, GS and oncology consultation.  Underwent colonoscopy on 9/23 that revealed large ulcerated mass at the ascending colon, terminal ileum and cecum.  Pathology consistent with adenocarcinoma.  CT of the chest planned as outpatient to help adequate staging of her malignancy through her primary oncologist Dr. Delton Coombes in [place].  General surgery, Dr. Dalbert Batman consulted for tumor resection and patient transferred to [facility] long.  Patient had partial colectomy by general surgery, & intraoperative cystoscopy and placement of right ureteral stent by urology on 9/25.  Patient had uncomplicated postoperative course.  Evaluated by PT/OT who recommended SNF.  See individual problem list below for more hospital  course.  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  No carcinoma identified in 16 lymph nodes. -Outpatient follow-up with general surgery for staple removal. -Follow-up with her oncologist, Dr. Delton Coombes.  Needs CT chest to complete staging work-up -May consider Questran if continues to have increased GI output. -Multivitamin  Transitional cell carcinoma of left ureter -s/p robotic resection of distal left ureter with reimplantation by Dr. Alyson Ingles in 04/2018 -Intraoperative cystoscopy and placement of right ureteral stent by urology 9/25 -Recommend outpatient urology follow-up in 3 to 4 weeks.  History of right breast cancer -Right lumpectomy/radiation in 2017, 0.7 cm IDC, grade 1, ER positive, PR negative, HER-2 negative. -Continued on anastrozole at this time.  -Mammogram on 09/19/2018 was BI-RADS Category 2. -Seems stable at this time  Acute on chronic blood loss anemia: Likely due to the above. -H&H stable after 2 units at [facility] prior to transfer.  Chronic COPD: On PRN oxygen at home.  Stable. -Continue home medications and PRN nebulizers  CKD-4: Stable -Avoid nephrotoxic meds. -Stop Lasix.  No history of CHF.  No edema.  Essential hypertension: Normotensive -Continue amlodipine -Stopped Lasix.  Hydrate for dehydration due to increased GI output  Asymptomatic bacteriuria: urine culture grew Klebsiella oxytoca on 9/25.  Discharge Instructions  Discharge Instructions    Call MD for:  difficulty breathing, headache or visual disturbances   Complete by: As directed    Call MD for:  hives   Complete by: As directed    Call MD for:  persistant dizziness or light-headedness   Complete by: As directed    Call MD for:  persistant nausea and vomiting   Complete by:  As  directed    Call MD for:  redness, tenderness, or signs of infection (pain, swelling, redness, odor or green/yellow discharge around incision site)   Complete by: As directed    Call MD for:  severe uncontrolled pain   Complete by: As directed    Call MD for:  temperature >100.4   Complete by: As directed    Diet - low sodium heart healthy   Complete by: As directed    Discharge wound care:   Complete by: As directed    Keep wound clean and dry for the most part You may wear a bandage or leave it open if there is no drainage You may shower Staples will be removed in the office next week   Driving Restrictions   Complete by: As directed    Do not drive   Increase activity slowly   Complete by: As directed    Lifting restrictions   Complete by: As directed    Limit 10 pounds lifting     Allergies as of 12/15/2018      Reactions   Augmentin [amoxicillin-pot Clavulanate] Nausea And Vomiting   Has patient had a PCN reaction causing immediate rash, facial/tongue/throat swelling, SOB or lightheadedness with hypotension: No Has patient had a PCN reaction causing severe rash involving mucus membranes or skin necrosis: No Has patient had a PCN reaction that required hospitalization: No Has patient had a PCN reaction occurring within the last 10 years: Unknown If all of the above answers are "NO", then may proceed with Cephalosporin use.   Amoxicillin Diarrhea   Has patient had a PCN reaction causing immediate rash, facial/tongue/throat swelling, SOB or lightheadedness with hypotension: No Has patient had a PCN reaction causing severe rash involving mucus membranes or skin necrosis: No Has patient had a PCN reaction that required hospitalization: No Has patient had a PCN reaction occurring within the last 10 years: Unknown If all of the above answers are "NO", then may proceed with Cephalosporin use.   Codeine Nausea And Vomiting   Morphine Nausea And Vomiting   Neomycin-bacitracin  Zn-polymyx Itching, Rash   Unable to use Neosporin      Medication List    STOP taking these medications   docusate sodium 100 MG capsule Commonly known as: COLACE   furosemide 20 MG tablet Commonly known as: LASIX   polyethylene glycol 17 g packet Commonly known as: MIRALAX / GLYCOLAX     TAKE these medications   amLODipine 5 MG tablet Commonly known as: NORVASC TAKE ONE TABLET BY MOUTH ONCE DAILY.   anastrozole 1 MG tablet Commonly known as: ARIMIDEX Take 1 tablet (1 mg total) by mouth daily. What changed: when to take this   fluticasone 50 MCG/ACT nasal spray Commonly known as: FLONASE Place 2 sprays into both nostrils daily as needed for allergies.   levocetirizine 5 MG tablet Commonly known as: XYZAL Take 5 mg by mouth daily as needed for allergies.   multivitamin with minerals tablet Take 1 tablet by mouth daily.   nortriptyline 10 MG capsule Commonly known as: PAMELOR TAKE 2 TO 3 CAPSULES BY MOUTH NIGHTLY. What changed: See the new instructions.   OXYGEN Inhale 2 L into the lungs. As needed for activities requiring exertion, per patient uses very sparingly   ProAir RespiClick 626 (90 Base) MCG/ACT Aepb Generic drug: Albuterol Sulfate INHALE 1 PUFF INTO THE LUNGS EVERY 4 HOURS AS NEEDED. What changed: See the new instructions.   Stiolto Respimat 2.5-2.5 MCG/ACT Aers  Generic drug: Tiotropium Bromide-Olodaterol INHALE 2 PUFFS INTO THE LUNGS ONCE DAILY. What changed: See the new instructions.            Discharge Care Instructions  (From admission, onward)         Start     Ordered   12/14/18 0000  Discharge wound care:    Comments: Keep wound clean and dry for the most part You may wear a bandage or leave it open if there is no drainage You may shower Staples will be removed in the office next week   12/14/18 0741         Follow-up Information    Fanny Skates, MD Follow up in 1 week(s).   Specialty: General Surgery Contact  information: [place] [address] [phone]        Kathyrn Drown, MD. Schedule an appointment as soon as possible for a visit in 1 week(s).   Specialty: Family Medicine Contact information: [place] [address] [phone]        Derek Jack, MD. Schedule an appointment as soon as possible for a visit in 2 week(s).   Specialty: Hematology Contact information: [place] [address] [phone]           Consultations:  General surgery  Urology  Procedures/Studies:  2D Echo: None  Ct Abdomen Pelvis Wo Contrast  Result Date: 12/04/2018 CLINICAL DATA:  85 year old female with right lower quadrant pain and tenderness. History of prior urothelial carcinoma on the left, right breast cancer. Status post left robotic assisted nephro ureterectomy. EXAM: CT ABDOMEN AND PELVIS WITHOUT CONTRAST TECHNIQUE: Multidetector CT imaging of the abdomen and pelvis was performed following the standard protocol without IV contrast. COMPARISON:  Prior CT abdomen/pelvis 02/01/2018 FINDINGS: Lower chest: Essentially stable nonspecific ground-glass attenuation airspace opacity in the posterior right lower lobe presently measures 3.5 x 1.6 cm compared to 3.1 x 1.6 cm in 2019. this lesion was previously evaluated on PET dated April 03, 2018 and was not hypermetabolic. The lung bases are otherwise clear. The intracardiac blood pool is hypodense relative to the adjacent myocardium consistent with anemia. No pericardial effusion. Unremarkable distal thoracic esophagus. Hepatobiliary: Stable 2.5 cm low-attenuation lesion with associated dystrophic calcification arising from the posterior aspect of hepatic segment 7. Numerous additional circumscribed low-attenuation lesions are present scattered throughout the liver. No significant interval change in the size, number or configuration of these lesions. Although incompletely  evaluated in the absence of intravenous contrast material, the stability is highly suggestive of a benign process. Gallbladder is unremarkable. No intra or extrahepatic biliary ductal dilatation. Pancreas: Unremarkable. No pancreatic ductal dilatation or surrounding inflammatory changes. Spleen: Normal in size without focal abnormality. Adrenals/Urinary Tract: No evidence of hydronephrosis. Stable low-attenuation lesion in the upper pole of the right kidney. No ureterectasis. The bladder is unremarkable. Stomach/Bowel: Unremarkable appearance of the stomach and duodenum. No evidence of bowel obstruction. There is a large colonic stool burden suggesting underlying constipation. Marked masslike thickening present in the cecum. The cecal margin is slightly irregular and shaggy with what appears to be hazy infiltration of the adjacent pericolonic fat. At least 1 adjacent ileocecal lymph node is enlarged at 1 cm. Nearby right common iliac chain lymph nodes are also concerning although not technically enlarged by imaging criteria. Vascular/Lymphatic: Limited evaluation in the absence of intravenous contrast. Atherosclerotic vascular calcifications throughout the abdominal aorta. No aneurysm. Reproductive: Surgical changes of prior hysterectomy. Other: Small  fat containing midline ventral hernia.  No ascites. Musculoskeletal: No acute fracture or aggressive appearing lytic or blastic osseous lesion. Multilevel degenerative disc disease. IMPRESSION: 1. Masslike thickening of the cecum with slightly ill-defined border and ileocecal and right common iliac lymphadenopathy. Findings are highly concerning for colon adenocarcinoma with nodal metastatic disease. 2. Multifocal circumscribed low-attenuation liver lesions demonstrate no significant interval change in size, number or configuration compared to prior imaging and are favored to be benign. No definite new lesion identified. 3. The intracardiac blood pool is hypodense  relative to the adjacent myocardium consistent with anemia. 4. Additional ancillary findings as above without significant interval change. Electronically Signed   By: Jacqulynn Cadet M.D.   On: 12/04/2018 16:09   Dg C-arm 1-60 Min-no Report  Result Date: 12/08/2018 Fluoroscopy was utilized by the requesting physician.  No radiographic interpretation.      Subjective: No major events overnight of this morning.  She had some loose bowel movements overnight after drinking Ensure.  Denies abdominal pain.  Denies melena or hematochezia.  Denies chest pain, dyspnea, dizziness or UTI symptoms.   Discharge Exam: Vitals:   12/15/18 0849 12/15/18 0900  BP:  130/69  Pulse:  100  Resp:  20  Temp:  98.1 F (36.7 C)  SpO2: 98%     GENERAL: No acute distress.  Appears well.  HEENT: MMM.  Vision and hearing grossly intact.  NECK: Supple.  No JVD.  LUNGS:  No IWOB. Good air movement bilaterally. HEART:  RRR. Heart sounds normal.  ABD: Bowel sounds present. Soft. Non tender.  Surgical wound appears clean and dry. MSK/EXT:  Moves all extremities. No apparent deformity. No edema bilaterally. SKIN: Abdominal surgical wound appears clean and dry. NEURO: Awake, alert and oriented appropriately.  No gross deficit.  PSYCH: Calm. Normal affect.     The results of significant diagnostics from this hospitalization (including imaging, microbiology, ancillary and laboratory) are listed below for reference.     Microbiology: Recent Results (from the past 240 hour(s))  Surgical pcr screen     Status: None   Collection Time: 12/08/18  6:21 AM   Specimen: Nasal Mucosa; Nasal Swab  Result Value Ref Range Status   MRSA, PCR NEGATIVE NEGATIVE Final   Staphylococcus aureus NEGATIVE NEGATIVE Final    Comment: (NOTE) The Xpert SA Assay (FDA approved for NASAL specimens in patients 25 years of age and older), is one component of a comprehensive surveillance program. It is not intended to diagnose  infection nor to guide or monitor treatment. Performed at [facility], [place] [address], [place], [place] [address]   Urine Culture     Status: Abnormal   Collection Time: 12/08/18  2:07 PM   Specimen: Urine, Random  Result Value Ref Range Status   Specimen Description   Final    URINE, RANDOM Performed at [facility], [place] [address] [place], [place] [address]    Special Requests   Final    NONE Performed at [facility], [place] [address], [place], [place] [address]    Culture >=100,000 COLONIES/mL KLEBSIELLA OXYTOCA (A)  Final   Report Status 12/10/2018 FINAL  Final   Organism ID, Bacteria KLEBSIELLA OXYTOCA (A)  Final      Susceptibility   Klebsiella oxytoca - MIC*    AMPICILLIN RESISTANT Resistant     CEFAZOLIN >=64 RESISTANT Resistant     CEFTRIAXONE <=1 SENSITIVE Sensitive     CIPROFLOXACIN <=0.25 SENSITIVE Sensitive  GENTAMICIN <=1 SENSITIVE Sensitive     IMIPENEM <=0.25 SENSITIVE Sensitive     NITROFURANTOIN 32 SENSITIVE Sensitive     TRIMETH/SULFA <=20 SENSITIVE Sensitive     AMPICILLIN/SULBACTAM 4 SENSITIVE Sensitive     PIP/TAZO <=4 SENSITIVE Sensitive     Extended ESBL NEGATIVE Sensitive     * >=100,000 COLONIES/mL KLEBSIELLA OXYTOCA  Novel Coronavirus, NAA (Hosp order, Send-out to Ref Lab; TAT 18-24 hrs     Status: None   Collection Time: 12/12/18  4:00 PM   Specimen: Nasopharyngeal Swab; Respiratory  Result Value Ref Range Status   SARS-CoV-2, NAA NOT DETECTED NOT DETECTED Final    Comment: (NOTE) This nucleic acid amplification test was developed and its performance characteristics determined by [employer]. Nucleic acid amplification tests include PCR and TMA. This test has not been FDA cleared or approved. This test has been authorized by FDA under an Emergency Use Authorization (EUA). This test is only authorized for the duration of time the declaration that circumstances exist justifying the  authorization of the emergency use of in vitro diagnostic tests for detection of SARS-CoV-2 virus and/or diagnosis of COVID-19 infection under section 564(b)(1) of the Act, 21 U.S.C. 076AUQ-3(F) (1), unless the authorization is terminated or revoked sooner. When diagnostic testing is negative, the possibility of a false negative result should be considered in the context of a patient's recent exposures and the presence of clinical signs and symptoms consistent with COVID-19. An individual without symptoms of COVID- 19 and who is not shedding SARS-CoV-2 vi rus would expect to have a negative (not detected) result in this assay. Performed At: [facility] [place], [place] [id number] Rush Farmer MD SL:[id number]    [place]  Final    Comment: Performed at [place] [address], [place], [place] [address]     Labs: BNP (last 3 results) Recent Labs    03/28/18 1634  BNP 203.5*   Basic Metabolic Panel: Recent Labs  Lab 12/09/18 0158 12/10/18 0151 12/11/18 0556 12/12/18 0548 12/13/18 0638 12/15/18 0600  NA 139 139 140 140 140  --   K 4.5 3.7 3.3* 4.0 3.7  --   CL 112* 109 110 112* 111  --   CO2 18* 18* 17* 11* 21*  --   GLUCOSE 179* 98 83 108* 92  --   BUN _0 --   CREATININE 1.77* 1.66* 1.42* 1.42* 1.47* 1.56*  CALCIUM 7.2* 7.1* 7.0* 7.7* 7.8*  --    Liver Function Tests: Recent Labs  Lab 12/09/18 0158  AST 26  ALT 28  ALKPHOS 79  BILITOT 0.3  PROT 5.9*  ALBUMIN 2.8*   No results for input(s): LIPASE, AMYLASE in the last 168 hours. No results for input(s): AMMONIA in the last 168 hours. CBC: Recent Labs  Lab 12/08/18 1636 12/09/18 0158 12/10/18 0151 12/12/18 0548 12/13/18 0638  WBC 16.5* 15.8* 11.9* 7.2 5.7  HGB 10.5* 10.3* 9.5* 9.6* 9.7*  HCT 35.1* 33.3* 30.7* 31.2* 31.8*  MCV 95.9 93.8 91.4 91.8 93.3  PLT 355 350 349 351 365   Cardiac Enzymes: No results for  input(s): CKTOTAL, CKMB, CKMBINDEX, TROPONINI in the last 168 hours. BNP: Invalid input(s): POCBNP CBG: Recent Labs  Lab 12/10/18 1542 12/11/18 0750 12/12/18 0519 12/13/18 0503 12/15/18 0530  GLUCAP 86 98 105* 88 126*   D-Dimer No results for input(s): DDIMER in the last 72 hours. Hgb A1c No results for input(s): HGBA1C  in the last 72 hours. Lipid Profile No results for input(s): CHOL, HDL, LDLCALC, TRIG, CHOLHDL, LDLDIRECT in the last 72 hours. Thyroid function studies No results for input(s): TSH, T4TOTAL, T3FREE, THYROIDAB in the last 72 hours.  Invalid input(s): FREET3 Anemia work up No results for input(s): VITAMINB12, FOLATE, FERRITIN, TIBC, IRON, RETICCTPCT in the last 72 hours. Urinalysis    Component Value Date/Time   COLORURINE YELLOW 12/30/2017 [place] 12/30/2017 1039   LABSPEC 1.012 12/30/2017 1039   PHURINE 5.0 12/30/2017 1039   GLUCOSEU NEGATIVE 12/30/2017 1039   HGBUR SMALL (A) 12/30/2017 1039   BILIRUBINUR NEGATIVE 12/30/2017 1039   KETONESUR NEGATIVE 12/30/2017 1039   PROTEINUR NEGATIVE 12/30/2017 1039   NITRITE NEGATIVE 12/30/2017 1039   LEUKOCYTESUR NEGATIVE 12/30/2017 1039   Sepsis Labs Invalid input(s): PROCALCITONIN,  WBC,  LACTICIDVEN   Time coordinating discharge: 35 minutes  SIGNED:  Mercy Riding, MD  [facility] 12/15/2018, 11:02 AM  If 7PM-7AM, please contact night-coverage [email or web address] Password [email or web address]

## 2018-12-15 NOTE — Progress Notes (Signed)
PT Cancellation Note  Patient Details Name: [name] MRN: [id number] DOB: June 10, 1928   Cancelled Treatment:     pt declined to walk.  "I'm getting ready to eat".     Rica Koyanagi  PTA Acute  Rehabilitation Services Pager      [phone] Office      [phone]

## 2018-12-15 NOTE — Progress Notes (Addendum)
Surgery:   POD #7 Transfer to [facility] was canceled because of resident testing positive for COVID-19.  Patient is disappointed. Social work looking for other SNF.  She is otherwise stable, doing well, and meets all discharge criteria  She had  diarrhea after drinking Ensure last night. Otherwise had a good day yesterday. Tolerating diet without nausea or pain. Abdominal exam is benign and wound is clean   Plan:  I started her on Questran 4 g packet twice daily due to diarrhea, possible bile salt malabsorption given resection of right colon and 2 to 3 feet of terminal ileum.  Please see my detailed notes from yesterday Please see my follow-up plans and discharge instructions which have been loaded into the AVS  I told her that I would be out of town for the next 10 days While hospitalized, one of my partners will stop by should there be any surgical needs She will be seen in my office next week for staple removal I will personally see her in 3 weeks as outpatient  Discharge per [facility] once bed available at SNF.    Edsel Petrin. Dalbert Batman, M.D., [facility] General and Minimally invasive Surgery Breast and Colorectal Surgery Office:   [phone]

## 2018-12-15 NOTE — Progress Notes (Signed)
Physical Therapy Treatment Patient Details Name: [name] MRN: [id number] DOB: 02-Aug-1928 Today's Date: 12/15/2018    History of Present Illness 85 yo female admitted to ED on 9/21 with anemia, colonic adenocarcinoma. Pt with history of urothelial carcinoma with lap nephroureterectomy2/2020 and now s/p cystoscopy and R ureteral stent placement 9/25; open laparoscopy, lysis of adhesions, R colectomy, SB resection, ileocolic anastomosis, repair of serosal injury of sigmoid colon on 9/25. PMH includes breast cancer, COPD, DM, HTN, OA, osteoporosis, peripheral neuropathy.    PT Comments    Assisted OOB to amb in hallway.   Follow Up Recommendations  SNF     Equipment Recommendations       Recommendations for Other Services       Precautions / Restrictions Precautions Precautions: Fall Precaution Comments: abdominal Restrictions Weight Bearing Restrictions: No    Mobility  Bed Mobility Overal bed mobility: Needs Assistance Bed Mobility: Rolling;Sidelying to Sit;Sit to Sidelying Rolling: Min guard Sidelying to sit: Min guard     Sit to sidelying: Min assist General bed mobility comments: use of bedrail to assist, increased time to perform and cues to log roll when transitioning to sitting  Transfers Overall transfer level: Needs assistance Equipment used: Rolling walker (2 wheeled) Transfers: Sit to/from Stand Sit to Stand: Min guard Stand pivot transfers: Min guard;Min assist       General transfer comment: VC's safety with turns  Ambulation/Gait Ambulation/Gait assistance: Supervision;Min guard Gait Distance (Feet): 127 Feet Assistive device: Rolling walker (2 wheeled) Gait Pattern/deviations: Step-through pattern;Decreased stride length;Trunk flexed Gait velocity: decreased   General Gait Details: x 2 standing rest breaks.  Mild unsteady   Stairs             Wheelchair Mobility    Modified Rankin (Stroke Patients Only)       Balance                                             Cognition Arousal/Alertness: Awake/alert Behavior During Therapy: WFL for tasks assessed/performed Overall Cognitive Status: Within Functional Limits for tasks assessed                                        Exercises      General Comments        Pertinent Vitals/Pain      Home Living                      Prior Function            PT Goals (current goals can now be found in the care plan section) Progress towards PT goals: Progressing toward goals    Frequency           PT Plan Current plan remains appropriate    Co-evaluation              AM-PAC PT "6 Clicks" Mobility   Outcome Measure  Help needed turning from your back to your side while in a flat bed without using bedrails?: A Little Help needed moving from lying on your back to sitting on the side of a flat bed without using bedrails?: A Little Help needed moving to and from a bed to a chair (including a wheelchair)?: A Little Help needed standing  up from a chair using your arms (e.g., wheelchair or bedside chair)?: A Little Help needed to walk in hospital room?: A Little Help needed climbing 3-5 steps with a railing? : A Little 6 Click Score: 18    End of Session Equipment Utilized During Treatment: Gait belt Activity Tolerance: Patient limited by fatigue Patient left: in bed;with call bell/phone within reach   PT Visit Diagnosis: Other abnormalities of gait and mobility (R26.89);Difficulty in walking, not elsewhere classified (R26.2)     Time: 4784-1282 PT Time Calculation (min) (ACUTE ONLY): 16 min  Charges:  $Gait Training: 8-22 mins                     Rica Koyanagi  PTA Acute  Rehabilitation Services Pager      [phone] Office      [phone]

## 2018-12-15 NOTE — Telephone Encounter (Signed)
Nurses Patient being discharged from hospital today and going to skilled nursing.  If possible please try to find out which skilled nursing facility she is going to thank you

## 2018-12-15 NOTE — Progress Notes (Signed)
BP= 144/67. Patient has history of HTN. Medication was given to patient. Patient is alert and oriented x4 .

## 2018-12-15 NOTE — Telephone Encounter (Signed)
[facility]

## 2018-12-15 NOTE — Care Management Important Message (Signed)
Important Message  Patient Details IM Letter given to Sharren Bridge SW to present to the Patient Name: [name] MRN: [id number] Date of Birth: [birth year]   Medicare Important Message Given:  Yes     Kerin Salen 12/15/2018, 11:55 AM

## 2018-12-15 NOTE — Progress Notes (Signed)
Patient has discharged to SNF on 12/15/2018. Discharge instruction including medication and appointment was sent with discharge package. RN called for report to RN Alex at [phone]. No question at this time.

## 2018-12-17 DIAGNOSIS — N184 Chronic kidney disease, stage 4 (severe): Secondary | ICD-10-CM | POA: Diagnosis not present

## 2018-12-17 DIAGNOSIS — D62 Acute posthemorrhagic anemia: Secondary | ICD-10-CM | POA: Diagnosis not present

## 2018-12-17 DIAGNOSIS — J449 Chronic obstructive pulmonary disease, unspecified: Secondary | ICD-10-CM | POA: Diagnosis not present

## 2018-12-17 DIAGNOSIS — I10 Essential (primary) hypertension: Secondary | ICD-10-CM | POA: Diagnosis not present

## 2018-12-17 DIAGNOSIS — C189 Malignant neoplasm of colon, unspecified: Secondary | ICD-10-CM | POA: Diagnosis not present

## 2018-12-17 DIAGNOSIS — C50919 Malignant neoplasm of unspecified site of unspecified female breast: Secondary | ICD-10-CM | POA: Diagnosis not present

## 2018-12-17 DIAGNOSIS — Z9049 Acquired absence of other specified parts of digestive tract: Secondary | ICD-10-CM | POA: Diagnosis not present

## 2018-12-22 ENCOUNTER — Other Ambulatory Visit: Payer: Self-pay | Admitting: *Deleted

## 2018-12-22 NOTE — Patient Outreach (Signed)
Late entry on 12/21/18  Member assessed for potential [facility] Care Management needs as a benefit of  [place] [employer].  Member is currently receiving rehab therapy at [facility] SNF.  Member discussed in weekly telephonic IDT meeting with  facility staff, [facility] UM team, and writer.  Facility reports member will discharge home likely next week. She has a cousin to assist. Facility provided caregiver information. Facility also reports member may need transportation assistance as well.   Will plan outreach to speak with member about [place] Management program and disposition plans.   Marthenia Rolling, MSN-Ed, RN,BSN [place] Acute Care Coordinator [phone] [facility]) [phone]  (Toll free office)

## 2018-12-26 ENCOUNTER — Inpatient Hospital Stay (HOSPITAL_COMMUNITY): Payer: Medicare Other | Attending: Hematology | Admitting: Hematology

## 2018-12-26 ENCOUNTER — Other Ambulatory Visit: Payer: Self-pay

## 2018-12-26 ENCOUNTER — Other Ambulatory Visit: Payer: Self-pay | Admitting: *Deleted

## 2018-12-26 ENCOUNTER — Inpatient Hospital Stay (HOSPITAL_COMMUNITY): Payer: Medicare Other

## 2018-12-26 ENCOUNTER — Encounter (HOSPITAL_COMMUNITY): Payer: Self-pay | Admitting: Hematology

## 2018-12-26 VITALS — BP 126/69 | HR 88 | Temp 98.2°F | Resp 16 | Wt 134.0 lb

## 2018-12-26 DIAGNOSIS — Z79899 Other long term (current) drug therapy: Secondary | ICD-10-CM | POA: Diagnosis not present

## 2018-12-26 DIAGNOSIS — C180 Malignant neoplasm of cecum: Secondary | ICD-10-CM | POA: Insufficient documentation

## 2018-12-26 DIAGNOSIS — Z800 Family history of malignant neoplasm of digestive organs: Secondary | ICD-10-CM | POA: Diagnosis not present

## 2018-12-26 DIAGNOSIS — Z90710 Acquired absence of both cervix and uterus: Secondary | ICD-10-CM | POA: Diagnosis not present

## 2018-12-26 DIAGNOSIS — C189 Malignant neoplasm of colon, unspecified: Secondary | ICD-10-CM

## 2018-12-26 DIAGNOSIS — M8580 Other specified disorders of bone density and structure, unspecified site: Secondary | ICD-10-CM | POA: Insufficient documentation

## 2018-12-26 DIAGNOSIS — Z79811 Long term (current) use of aromatase inhibitors: Secondary | ICD-10-CM | POA: Insufficient documentation

## 2018-12-26 DIAGNOSIS — Z8554 Personal history of malignant neoplasm of ureter: Secondary | ICD-10-CM | POA: Insufficient documentation

## 2018-12-26 DIAGNOSIS — Z170 Estrogen receptor positive status [ER+]: Secondary | ICD-10-CM | POA: Insufficient documentation

## 2018-12-26 DIAGNOSIS — Z87891 Personal history of nicotine dependence: Secondary | ICD-10-CM | POA: Insufficient documentation

## 2018-12-26 DIAGNOSIS — C50911 Malignant neoplasm of unspecified site of right female breast: Secondary | ICD-10-CM | POA: Diagnosis not present

## 2018-12-26 DIAGNOSIS — I10 Essential (primary) hypertension: Secondary | ICD-10-CM

## 2018-12-26 DIAGNOSIS — Z923 Personal history of irradiation: Secondary | ICD-10-CM | POA: Diagnosis not present

## 2018-12-26 DIAGNOSIS — E119 Type 2 diabetes mellitus without complications: Secondary | ICD-10-CM | POA: Insufficient documentation

## 2018-12-26 LAB — COMPREHENSIVE METABOLIC PANEL
ALT: 22 U/L (ref 0–44)
AST: 19 U/L (ref 15–41)
Albumin: 3.4 g/dL — ABNORMAL LOW (ref 3.5–5.0)
Alkaline Phosphatase: 81 U/L (ref 38–126)
Anion gap: 11 (ref 5–15)
BUN: 15 mg/dL (ref 8–23)
CO2: 19 mmol/L — ABNORMAL LOW (ref 22–32)
Calcium: 9.1 mg/dL (ref 8.9–10.3)
Chloride: 110 mmol/L (ref 98–111)
Creatinine, Ser: 1.54 mg/dL — ABNORMAL HIGH (ref 0.44–1.00)
GFR calc Af Amer: 35 mL/min — ABNORMAL LOW (ref >60)
GFR calc non Af Amer: 30 mL/min — ABNORMAL LOW (ref >60)
Glucose, Bld: 118 mg/dL — ABNORMAL HIGH (ref 70–99)
Potassium: 3.2 mmol/L — ABNORMAL LOW (ref 3.5–5.1)
Sodium: 140 mmol/L (ref 135–145)
Total Bilirubin: 0.1 mg/dL — ABNORMAL LOW (ref 0.3–1.2)
Total Protein: 6.7 g/dL (ref 6.5–8.1)

## 2018-12-26 LAB — CBC WITH DIFFERENTIAL/PLATELET
Abs Immature Granulocytes: 0.02 10*3/uL (ref 0.00–0.07)
Basophils Absolute: 0.1 10*3/uL (ref 0.0–0.1)
Basophils Relative: 1 %
Eosinophils Absolute: 0.3 10*3/uL (ref 0.0–0.5)
Eosinophils Relative: 4 %
HCT: 32.3 % — ABNORMAL LOW (ref 36.0–46.0)
Hemoglobin: 9.7 g/dL — ABNORMAL LOW (ref 12.0–15.0)
Immature Granulocytes: 0 %
Lymphocytes Relative: 18 %
Lymphs Abs: 1.4 10*3/uL (ref 0.7–4.0)
MCH: 27.6 pg (ref 26.0–34.0)
MCHC: 30 g/dL (ref 30.0–36.0)
MCV: 92 fL (ref 80.0–100.0)
Monocytes Absolute: 0.6 10*3/uL (ref 0.1–1.0)
Monocytes Relative: 7 %
Neutro Abs: 5.5 10*3/uL (ref 1.7–7.7)
Neutrophils Relative %: 70 %
Platelets: 433 10*3/uL — ABNORMAL HIGH (ref 150–400)
RBC: 3.51 MIL/uL — ABNORMAL LOW (ref 3.87–5.11)
RDW: 17.1 % — ABNORMAL HIGH (ref 11.5–15.5)
WBC: 7.9 10*3/uL (ref 4.0–10.5)
nRBC: 0 % (ref 0.0–0.2)

## 2018-12-26 NOTE — Progress Notes (Signed)
I emailed pathology and ordered BRAF mutation on accession # [id number] per Dr. Tomie China request.

## 2018-12-26 NOTE — Patient Outreach (Signed)
Telephone call received from [place] indicating Mrs. Phegley discharged home today from [facility]. Recommends [facility] Care Management follow up.   Mrs. Skilling lives alone with support of a cousin. Facility previously reported member will have transportation needs.   Writer did not have a mobile phone number on file to contact prior to discharge from SNF.   Telephone call made to [phone]. There was no answer. Unable to leave voicemail message.   It appears in Epic that member is currently in an appointment at [facility].   Will make referral to [facility] for complex case managment and [facility] BSW for transportation needs.  Mrs. Birden has a medical history breast cancer, urethral cancer, COPD, HTN.  Marthenia Rolling, MSN-Ed, RN,BSN [place] Acute Care Coordinator [phone] [facility]) [phone]  (Toll free office)

## 2018-12-26 NOTE — Assessment & Plan Note (Signed)
1.  Stage II (T4BN0) cecal adenocarcinoma: -CTAP on 12/04/2018 showed thickening of the wall of the cecum. - Colonoscopy on 12/06/2018 showed mass in the cecum. - Right hemicolectomy on 12/08/2018 showed moderately differentiated adenocarcinoma, margins clear, 0/16 lymph nodes positive, LVSI positive.  Preoperative CEA of 12.8. - Loss of MLH1 and PMS2 seen. - I have recommended CT of the chest to complete staging work-up.  We will also obtain a CEA level. - We will order BRAF mutation testing on the pathology. - She does not require any adjuvant chemotherapy if the scans are negative for metastatic disease.  2.  Stage I right breast cancer: - Right breast lumpectomy on 05/23/2015, 0.7 cm IDC, grade 1, associated low-grade DCIS, margins negative, ER positive, PR negative, HER-2 negative, Ki-67 10%. -Anastrozole started in June 2017, she is continuing to tolerate with occasional hot flashes. -Mammogram on 09/19/2018 was BI-RADS Category 2.  3.  Osteopenia: -DEXA scan on 08/02/2016 shows T score of -1.3.  DEXA scan on 10/12/2018 shows T score of -2.4. - She was treated with Fosamax which was discontinued in 2019. - Prolia was started on 11/30/2018.  She will continue calcium and vitamin D.  4.  Urothelial carcinoma, high-grade of the left distal ureter: -He underwent left distal ureteral resection on 05/11/2018 by Dr. Alyson Ingles. - Pathology showed PT2PN0 high-grade urothelial carcinoma, 0 out of 1 lymph node involved.

## 2018-12-26 NOTE — Progress Notes (Signed)
[place], [place] [address]   CLINIC:  Medical Oncology/Hematology  PCP:  Kathyrn Drown, MD [address] [place] [address] [phone]   REASON FOR VISIT: Newly diagnosed cecal cancer and history of right breast cancer and left ureteral cancer.  CURRENT THERAPY:  Anastrozole.  BRIEF ONCOLOGIC HISTORY:  Oncology History  Breast cancer, right breast ([place])  04/22/2015 Procedure   Right breast needle core biopsy   04/24/2015 Pathology Results   Invasive ductal carcinoma, grade 1.  ER100%, PR NEGATIVE, Ki-67 10%, HER2 NEGATIVE   05/23/2015 Procedure   Right breast lumpectomy by Dr. Dalbert Batman   05/27/2015 Pathology Results   Breast, lumpectomy, Right - INVASIVE GRADE I DUCTAL CARCINOMA SPANNING 0.7 CM IN GREATEST LINEAR DIMENSION. - ASSOCIATED LOW GRADE DUCTAL CARCINOMA IN SITU. - TUMOR SHOWS ASSOCIATED CALCIFICATIONS. - LOBULAR CARCINOMA IN SITU WITH ASSOCIATED CALCIFICATIONS. - MARGINS ARE NEGATIVE.    - 07/23/2015 Radiation Therapy   Dr. Pablo Ledger   08/01/2015 Imaging   Bone density- BMD as determined from Femur Neck Left is 0.891 g/cm2 with a T-Score of -1.1. This patient is considered osteopenic according to [facility]) criteria.   08/14/2015 -  Anti-estrogen oral therapy   Arimidex   08/02/2016 Imaging   Bone density- This patient is considered osteopenic according to [facility]) criteria.       CANCER STAGING: Cancer Staging Breast cancer, right breast ([place]) Staging form: Breast, AJCC 8th Edition - Pathologic stage from 05/27/2015: Stage IB (pT1b, pN0, cM0, G1, ER: Positive, PR: Negative, HER2: Negative) - Signed by Baird Cancer, PA-C on 03/25/2016  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 Staging form: Colon and Rectum, AJCC 8th Edition - Clinical: Stage IIC (cT4b, cN0, cM0) - Unsigned  Cecal cancer ([place]) Staging form: Colon and Rectum, AJCC 8th  Edition - Clinical: Stage IIC (cT4b, cN0, cM0) - Unsigned    INTERVAL HISTORY:  Ms. Ignatowski 85 y.o. female seen for follow-up of recent hospitalization.  She was found to have cecal mass on CT scan on 12/04/2018.  She underwent colonoscopy on 12/06/2018 which showed mass in the cecum.  Right hemicolectomy on 12/08/2018 showed moderately differentiated adenocarcinoma.  She is discharged today from nursing home in [place].  She has done well in the last couple of weeks and is regaining her strength back.  Appetite is 100%.  Energy levels are 50%.  She does report diarrhea soon after eating any food.  She has a follow-up appointment with Dr. Dalbert Batman tomorrow.  REVIEW OF SYSTEMS:  Review of Systems  Cardiovascular: Positive for leg swelling.  Gastrointestinal: Positive for diarrhea.  All other systems reviewed and are negative.    PAST MEDICAL/SURGICAL HISTORY:  Past Medical History:  Diagnosis Date  . Breast cancer ([place])   . Breast cancer, right breast ([place]) 05/01/2015  . Cancer (HCC)    BREAST CANCER   . Colon polyps   . COPD (chronic obstructive pulmonary disease) ([place])    DR. [place] Bells  . Diabetes mellitus without complication ([place])    PREDIABETIC  CONTROL W/ DIET  . Hypercholesteremia   . Hypertension   . Lung nodules   . Osteoarthritis   . Osteopenia 09/18/2013  . Osteoporosis   . Personal history of radiation therapy   . PONV (postoperative nausea and vomiting)    ' i get so sick with anesthesia"   . Shortness of breath dyspnea    W/ EXERTION  .  Tick bite    3 years ago    Past Surgical History:  Procedure Laterality Date  . ABDOMINAL HYSTERECTOMY    . APPENDECTOMY    . BARTHOLIN GLAND CYST EXCISION    . BIOPSY  12/06/2018   Procedure: BIOPSY;  Surgeon: Rogene Houston, MD;  [facility];  Service: Endoscopy;;  cecal mass biopsies   . BREAST EXCISIONAL BIOPSY Left    benign  . BREAST LUMPECTOMY WITH RADIOACTIVE SEED LOCALIZATION Right 05/23/2015    Procedure: RIGHT BREAST LUMPECTOMY WITH RADIOACTIVE SEED LOCALIZATION;  Surgeon: Fanny Skates, MD;  Location: [place];  Service: General;  Laterality: Right;  . BREAST SURGERY     2000  LEFT BREAST  LUMP REMOVED  . cataract surgeries  Bilateral   . COLON RESECTION N/A 12/08/2018   Procedure: OPEN SEGMENTAL COLON RESECTION;  Surgeon: Fanny Skates, MD;  Location: [facility];  Service: General;  Laterality: N/A;  . colonoscopy    . COLONOSCOPY  03/25/2011   Procedure: COLONOSCOPY;  Surgeon: Rogene Houston, MD;  [facility];  Service: Endoscopy;  Laterality: N/A;  9:30 am  . COLONOSCOPY N/A 12/06/2018   Procedure: COLONOSCOPY;  Surgeon: Rogene Houston, MD;  [facility];  Service: Endoscopy;  Laterality: N/A;  . CYSTOSCOPY W/ URETERAL STENT PLACEMENT Left 02/27/2018   Procedure: CYSTOSCOPY WITH LEFT RETROGRADE PYELOGRAM AND lEFT URETEROSCOPY WITH BIOPSY AND LEFT URETERAL STENT PLACEMENT;  Surgeon: Cleon Gustin, MD;  Location: [facility];  Service: Urology;  Laterality: Left;  . CYSTOSCOPY W/ URETERAL STENT PLACEMENT Right 12/08/2018   Procedure: CYSTOSCOPY WITH RETROGRADE PYELOGRAM/URETERAL STENT PLACEMENT;  Surgeon: Raynelle Bring, MD;  Location[facility];  Service: Urology;  Laterality: Right;  . left inguinal hernia repair    . multiple toe surgeries    . ROBOT ASSITED LAPAROSCOPIC NEPHROURETERECTOMY Left 05/11/2018   Procedure: XI ROBOT ASSITED LAPAROSCOPIC LEFT DISTAL URETERECTOMY, URETERAL  REIMPLANTATION, WITH PSOAS HITCH AND BOARI FLAP. PELVIC LYMPH NODE DISSCETION AND LEFT URETERAL STENT PLACEMENT;  Surgeon: Cleon Gustin, MD;  Location[facility];  Service: Urology;  Laterality: Left;  . TONSILLECTOMY       SOCIAL HISTORY:  Social History   Socioeconomic History  . Marital status: Widowed    Spouse name: Not on file  . Number of children: Not on file  . Years of education: Not on file  . Highest education level: Not on file  Occupational History  . Not  on file  Social Needs  . Financial resource strain: Not on file  . Food insecurity    Worry: Not on file    Inability: Not on file  . Transportation needs    Medical: Not on file    Non-medical: Not on file  Tobacco Use  . Smoking status: Former Smoker    Packs/day: 2.00    Years: 50.00    Pack years: 100.00    Types: Cigarettes    Quit date: 03/15/1988    Years since quitting: 30.8  . Smokeless tobacco: Never Used  Substance and Sexual Activity  . Alcohol use: Yes    Alcohol/week: 0.0 standard drinks    Comment: seldom   . Drug use: No  . Sexual activity: Not on file  Lifestyle  . Physical activity    Days per week: Not on file    Minutes per session: Not on file  . Stress: Not on file  Relationships  . Social Herbalist on  phone: Not on file    Gets together: Not on file    Attends religious service: Not on file    Active member of club or organization: Not on file    Attends meetings of clubs or organizations: Not on file    Relationship status: Not on file  . Intimate partner violence    Fear of current or ex partner: Not on file    Emotionally abused: Not on file    Physically abused: Not on file    Forced sexual activity: Not on file  Other Topics Concern  . Not on file  Social History Narrative  . Not on file    FAMILY HISTORY:  Family History  Problem Relation Age of Onset  . Colon cancer Father     CURRENT MEDICATIONS:  Outpatient Encounter Medications as of 12/26/2018  Medication Sig  . amLODipine (NORVASC) 5 MG tablet TAKE ONE TABLET BY MOUTH ONCE DAILY. (Patient taking differently: Take 5 mg by mouth daily. )  . anastrozole (ARIMIDEX) 1 MG tablet Take 1 tablet (1 mg total) by mouth daily. (Patient taking differently: Take 1 mg by mouth every evening. )  . fluticasone (FLONASE) 50 MCG/ACT nasal spray Place 2 sprays into both nostrils daily as needed for allergies.   Marland Kitchen levocetirizine (XYZAL) 5 MG tablet Take 5 mg by mouth daily as needed  for allergies.   . Multiple Vitamins-Minerals (MULTIVITAMIN WITH MINERALS) tablet Take 1 tablet by mouth daily.  . nortriptyline (PAMELOR) 10 MG capsule TAKE 2 TO 3 CAPSULES BY MOUTH NIGHTLY. (Patient taking differently: Take 20-30 mg by mouth at bedtime. )  . OXYGEN Inhale 2 L into the lungs. As needed for activities requiring exertion, per patient uses very sparingly  . PROAIR RESPICLICK 638 (90 Base) MCG/ACT AEPB INHALE 1 PUFF INTO THE LUNGS EVERY 4 HOURS AS NEEDED. (Patient taking differently: Inhale 1 puff into the lungs every 4 (four) hours as needed (shortness of breath or wheezing). )  . STIOLTO RESPIMAT 2.5-2.5 MCG/ACT AERS INHALE 2 PUFFS INTO THE LUNGS ONCE DAILY. (Patient taking differently: Inhale 2 puffs into the lungs daily as needed (for shortness of breath). )   No facility-administered encounter medications on file as of 12/26/2018.     ALLERGIES:  Allergies  Allergen Reactions  . Augmentin [Amoxicillin-Pot Clavulanate] Nausea And Vomiting    Has patient had a PCN reaction causing immediate rash, facial/tongue/throat swelling, SOB or lightheadedness with hypotension: No Has patient had a PCN reaction causing severe rash involving mucus membranes or skin necrosis: No Has patient had a PCN reaction that required hospitalization: No Has patient had a PCN reaction occurring within the last 10 years: Unknown If all of the above answers are "NO", then may proceed with Cephalosporin use.    Marland Kitchen Amoxicillin Diarrhea    Has patient had a PCN reaction causing immediate rash, facial/tongue/throat swelling, SOB or lightheadedness with hypotension: No Has patient had a PCN reaction causing severe rash involving mucus membranes or skin necrosis: No Has patient had a PCN reaction that required hospitalization: No Has patient had a PCN reaction occurring within the last 10 years: Unknown If all of the above answers are "NO", then may proceed with Cephalosporin use.   . Codeine Nausea And  Vomiting  . Morphine Nausea And Vomiting  . Neomycin-Bacitracin Zn-Polymyx Itching and Rash    Unable to use Neosporin     PHYSICAL EXAM:  ECOG Performance status: 1  Vitals:   12/26/18 1400  BP: 126/69  Pulse: 88  Resp: 16  Temp: 98.2 F (36.8 C)  SpO2: 100%   Filed Weights   12/26/18 1400  Weight: 134 lb (60.8 kg)    Physical Exam Constitutional:      Appearance: Normal appearance. She is normal weight.  Cardiovascular:     Rate and Rhythm: Normal rate and regular rhythm.     Heart sounds: Normal heart sounds.  Pulmonary:     Effort: Pulmonary effort is normal.     Breath sounds: Normal breath sounds.  Abdominal:     General: There is no distension.     Palpations: Abdomen is soft. There is no mass.  Musculoskeletal: Normal range of motion.  Skin:    General: Skin is warm and dry.  Neurological:     Mental Status: She is alert and oriented to person, place, and time. Mental status is at baseline.  Psychiatric:        Mood and Affect: Mood normal.        Behavior: Behavior normal.   Midline suture in the abdomen is well-healed.  Steri-Strips in place.   LABORATORY DATA:  I have reviewed the labs as listed.  CBC    Component Value Date/Time   WBC 7.9 12/26/2018 1543   RBC 3.51 (L) 12/26/2018 1543   HGB 9.7 (L) 12/26/2018 1543   HGB 8.3 (L) 10/09/2018 0918   HCT 32.3 (L) 12/26/2018 1543   HCT 26.1 (L) 10/09/2018 0918   PLT 433 (H) 12/26/2018 1543   PLT 373 10/09/2018 0918   MCV 92.0 12/26/2018 1543   MCV 92 10/09/2018 0918   MCH 27.6 12/26/2018 1543   MCHC 30.0 12/26/2018 1543   RDW 17.1 (H) 12/26/2018 1543   RDW 12.7 10/09/2018 0918   LYMPHSABS 1.4 12/26/2018 1543   LYMPHSABS 1.5 10/09/2018 0918   MONOABS 0.6 12/26/2018 1543   EOSABS 0.3 12/26/2018 1543   EOSABS 0.2 10/09/2018 0918   BASOSABS 0.1 12/26/2018 1543   BASOSABS 0.0 10/09/2018 0918   CMP Latest Ref Rng & Units 12/26/2018 12/15/2018 12/13/2018  Glucose 70 - 99 mg/dL 118(H) - 92  BUN  8 - 23 mg/dL 15 - 19  Creatinine 0.44 - 1.00 mg/dL 1.54(H) 1.56(H) 1.47(H)  Sodium 135 - 145 mmol/L 140 - 140  Potassium 3.5 - 5.1 mmol/L 3.2(L) - 3.7  Chloride 98 - 111 mmol/L 110 - 111  CO2 22 - 32 mmol/L 19(L) - 21(L)  Calcium 8.9 - 10.3 mg/dL 9.1 - 7.8(L)  Total Protein 6.5 - 8.1 g/dL 6.7 - -  Total Bilirubin 0.3 - 1.2 mg/dL 0.1(L) - -  Alkaline Phos 38 - 126 U/L 81 - -  AST 15 - 41 U/L 19 - -  ALT 0 - 44 U/L 22 - -       DIAGNOSTIC IMAGING:  I have independently reviewed the scans and discussed with the patient.    ASSESSMENT & PLAN:   Cecal cancer ([place]) 1.  Stage II (T4BN0) cecal adenocarcinoma: -CTAP on 12/04/2018 showed thickening of the wall of the cecum. - Colonoscopy on 12/06/2018 showed mass in the cecum. - Right hemicolectomy on 12/08/2018 showed moderately differentiated adenocarcinoma, margins clear, 0/16 lymph nodes positive, LVSI positive.  Preoperative CEA of 12.8. - Loss of MLH1 and PMS2 seen. - I have recommended CT of the chest to complete staging work-up.  We will also obtain a CEA level. - We will order BRAF mutation testing on the pathology. - She does not require any adjuvant chemotherapy if  the scans are negative for metastatic disease.  2.  Stage I right breast cancer: - Right breast lumpectomy on 05/23/2015, 0.7 cm IDC, grade 1, associated low-grade DCIS, margins negative, ER positive, PR negative, HER-2 negative, Ki-67 10%. -Anastrozole started in June 2017, she is continuing to tolerate with occasional hot flashes. -Mammogram on 09/19/2018 was BI-RADS Category 2.  3.  Osteopenia: -DEXA scan on 08/02/2016 shows T score of -1.3.  DEXA scan on 10/12/2018 shows T score of -2.4. - She was treated with Fosamax which was discontinued in 2019. - Prolia was started on 11/30/2018.  She will continue calcium and vitamin D.  4.  Urothelial carcinoma, high-grade of the left distal ureter: -He underwent left distal ureteral resection on 05/11/2018 by Dr. Alyson Ingles.  - Pathology showed PT2PN0 high-grade urothelial carcinoma, 0 out of 1 lymph node involved.     Total time spent is 40 minutes with more than 50% of the time spent face-to-face discussing pathology report, further work-up, counseling and coordination of care.    Orders placed this encounter:  Orders Placed This Encounter  Procedures  . CT Chest W Contrast  . CBC with Differential/Platelet  . Comprehensive metabolic panel  . CEA      Derek Jack, MD [place] [phone]

## 2018-12-26 NOTE — Patient Instructions (Addendum)
[place] at [facility] Discharge Instructions  You were seen today by Dr. Delton Coombes. He went over your recent lab results. He will get blood drawn today. He will schedule you for a CT of your chest. He will see you back in 4 weeks for follow up.   Thank you for choosing [place] at [facility] to provide your oncology and hematology care.  To afford each patient quality time with our provider, please arrive at least 15 minutes before your scheduled appointment time.   If you have a lab appointment with the [facility] please come in thru the  Main Entrance and check in at the main information desk  You need to re-schedule your appointment should you arrive 10 or more minutes late.  We strive to give you quality time with our providers, and arriving late affects you and other patients whose appointments are after yours.  Also, if you no show three or more times for appointments you may be dismissed from the clinic at the providers discretion.     Again, thank you for choosing [facility].  Our hope is that these requests will decrease the amount of time that you wait before being seen by our physicians.       _____________________________________________________________  Should you have questions after your visit to [facility], please contact our office at [phone] between the hours of 8:00 a.m. and 4:30 p.m.  Voicemails left after 4:00 p.m. will not be returned until the following business day.  For prescription refill requests, have your pharmacy contact our office and allow 72 hours.    Cancer Center Support Programs:   > Cancer Support Group  2nd Tuesday of the month 1pm-2pm, Journey Room

## 2018-12-27 ENCOUNTER — Other Ambulatory Visit: Payer: Self-pay

## 2018-12-27 ENCOUNTER — Other Ambulatory Visit: Payer: Self-pay | Admitting: *Deleted

## 2018-12-27 LAB — CEA: CEA: 10.1 ng/mL — ABNORMAL HIGH (ref 0.0–4.7)

## 2018-12-27 NOTE — Patient Outreach (Signed)
[facility]) Care Management  12/27/2018  [name] 08/10/1928 [id number]   High priority referral received from [place], Marthenia Rolling, to contact patient regarding transportation needs. Successful outreach to patient today.  Introduced self and reason for call.  Patient denied need for transportation assistance at this time.  Per patient, MID informed her that she should not drive for three days.  Patient reports having friend that will assist with transportation needs until she is able to drive again.  No other social work needs were identified so discipline being closed at this time.  Ronn Melena, BSW Social Worker [phone]

## 2018-12-27 NOTE — Patient Outreach (Signed)
[name] [facility]) Care Management  12/27/2018  [name] March 01, 1929 [id number]   Referral Date: 12/26/2018 Referral Source:  [name] coordinator Date of Admission: 12/15/2018 (to SNF) Diagnosis: Cancer of Cecum, s/p right colectomy Date of Discharge: 10/132020 Facility: [facility] Insurance: Next [employer] attempt # 1, unsuccessful.  Call placed to member for transition of care assessment, no answer, unable to leave a message.   Plan: RN CM will send unsuccessful outreach letter and follow up within the next 3-4 business days.  [name], [place], MSN [place] [phone]

## 2018-12-28 ENCOUNTER — Telehealth: Payer: Self-pay | Admitting: Family Medicine

## 2018-12-28 ENCOUNTER — Other Ambulatory Visit: Payer: Self-pay | Admitting: *Deleted

## 2018-12-28 MED ORDER — FUROSEMIDE 20 MG PO TABS: 20.0000 mg | ORAL_TABLET | Freq: Every day | ORAL | 0 refills | Status: DC

## 2018-12-28 MED ORDER — POTASSIUM CHLORIDE ER 10 MEQ PO TBCR: EXTENDED_RELEASE_TABLET | ORAL | 0 refills | Status: DC

## 2018-12-28 MED ORDER — FUROSEMIDE 20 MG PO TABS: ORAL_TABLET | ORAL | 0 refills | Status: DC

## 2018-12-28 NOTE — Telephone Encounter (Signed)
Pt recently had surgery & spent some time at a nursing home rehab, Dr. Dalbert Batman took pt off of her fluid pill & has since experienced swelling in her feet & legs, not currently having trouble breathing   Pt would like advise from Dr. Nicki Reaper on what to do.  Has office visit here on 01/03/2019, states she has some Lasix at home but didn't want to start taking it until she got Dr. Bary Leriche opinion  Please advise & call pt     [facility]

## 2018-12-28 NOTE — Telephone Encounter (Signed)
Furosemide 20 mg 1 every morning as needed for swelling would be fine Also she should take potassium with this 10 mEq 1 twice daily on the days that she takes Lasix  Does the patient have enough of these medicines to last her until her office visit on Wednesday? Certainly if she is getting worse we could work her in sooner but otherwise Wednesday would be fine

## 2018-12-28 NOTE — Telephone Encounter (Signed)
FYI ONLY  Physical therapist at [facility] called to let us know that patient declined him coming out today or tomorrow stating that she had appts already and he will go one day next week.

## 2018-12-28 NOTE — Telephone Encounter (Signed)
Discussed with pt. Pt verbalized understanding. meds sent to pharm. Pt did not have potassium and she was not sure if she had lasix. Asked to just send in both and put lasix on file in case she needed it. She does have visit on Wednesday. Advised to let us know if she needs anything before then.

## 2018-12-28 NOTE — Telephone Encounter (Signed)
Please advise. Thank you

## 2018-12-28 NOTE — Telephone Encounter (Signed)
FYI

## 2018-12-29 ENCOUNTER — Other Ambulatory Visit: Payer: Self-pay | Admitting: *Deleted

## 2018-12-29 NOTE — Patient Outreach (Signed)
[facility]) Care Management  12/29/2018  [name] 12-26-1928 [id number]  COVERAGE FOR [name] [facility]  EMMI-GENERAL DISCHARGE  RED ON EMMI ALERT Day # 1 Date: 12/28/2018 Red Alert Reason: -Know who to call with problems or concerns/Unfilled medications/questions/problems  Outreach #1 RN spoke with pt today and introduced the purpose for today's call. Pt very receptive as Therapist, sports inquired on the recent emmi call. 1.  RN able to verify pt is aware and confirmed to contact her surgeon with any related surgical issues or encountered problems (Dr. Dalbert Batman). RN offered to provide the contact number however pt has this number and able to call with any issues.  2.  RN inquired on unfilled medications. Pt states she has recently filled all her medications and currently awaiting delivery from her pharmacy. No additional problems at this time. 3.  Other questions or problems. Pt reports she has visited her provider since taking the survey and denies any other problems at this time.   RN inquired on any other issues as pt denied any other issues at this time. RN offered contact if she develops any additional needs and made pt aware that she may receive another outreach call related to her recent discharged. Pt very pleasant and appreciative for the follow up. Again additional needs as this case will be closed from any EMMI related issues.   Raina Mina, RN Care Management Coordinator [place] Office [phone]

## 2019-01-01 ENCOUNTER — Other Ambulatory Visit: Payer: Self-pay | Admitting: *Deleted

## 2019-01-01 NOTE — Patient Outreach (Signed)
[place] [facility]) Care Management  01/01/2019  CATHYRN DEAS [birth year] [id number]   Referral Date: 12/26/2018 Referral Source:  [facility] coordinator Date of Admission: 12/15/2018 (to SNF) Diagnosis: Cancer of Cecum, s/p right colectomy Date of Discharge: 10/132020 Facility: [facility] Insurance: [employer] attempt # 2, unsuccessful.  Call placed to member for transition of care assessment, no answer, unable to leave a message.   Plan:  RNCM will follow up within the next 3-4 business days.  Valente David, [place], MSN [place] [phone]

## 2019-01-03 ENCOUNTER — Ambulatory Visit: Payer: Medicare Other | Admitting: Family Medicine

## 2019-01-03 ENCOUNTER — Ambulatory Visit (INDEPENDENT_AMBULATORY_CARE_PROVIDER_SITE_OTHER): Payer: Medicare Other | Admitting: Family Medicine

## 2019-01-03 ENCOUNTER — Other Ambulatory Visit: Payer: Self-pay

## 2019-01-03 ENCOUNTER — Encounter: Payer: Self-pay | Admitting: Family Medicine

## 2019-01-03 VITALS — BP 122/78 | Temp 95.0°F | Wt 134.6 lb

## 2019-01-03 DIAGNOSIS — C189 Malignant neoplasm of colon, unspecified: Secondary | ICD-10-CM

## 2019-01-03 DIAGNOSIS — I10 Essential (primary) hypertension: Secondary | ICD-10-CM | POA: Diagnosis not present

## 2019-01-03 DIAGNOSIS — R600 Localized edema: Secondary | ICD-10-CM | POA: Diagnosis not present

## 2019-01-03 NOTE — Progress Notes (Signed)
Subjective:    Patient ID: [name], female    DOB: 07/03/1928, 85 y.o.   MRN: [id number]  HPI Pt here today for follow up from being in a rehab/nursing home center. Pt states that she is still having swelling in legs and feet. Pt is back to taking her Lasix. Pt had intestinal surgery.   This patient is been very remarkable what she is gone through.  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  She is followed by local oncology-Dr. Delton Coombes.  She is a very independent person who has been very determined to get her health back.  She is now back on her own she is having some friends help her out she is back to driving.  She states her energy is starting to improve.  She is having some swelling in the legs but not severe.  She does have an upcoming CAT scan of the chest  She is taking Lasix every morning along with potassium twice a day she is also taking her amlodipine her blood pressure overall is doing well she states her oral intake is starting to do better.  She states her breathing is doing good.  She is certainly hopeful that she will have ongoing improvement Review of Systems  Constitutional: Positive for fatigue. Negative for activity change, appetite change and fever.  HENT: Negative for congestion and rhinorrhea.   Respiratory: Negative for cough and shortness of breath.   Cardiovascular: Negative for chest pain and leg swelling.  Gastrointestinal: Negative for abdominal pain and diarrhea.  Endocrine: Negative for polydipsia and polyphagia.  Skin: Negative for color change.  Neurological: Negative for dizziness and weakness.  Psychiatric/Behavioral: Negative for behavioral problems and confusion.       Objective:   Physical Exam Vitals signs reviewed.  Constitutional:      General: She is not in acute distress. HENT:     Head: Normocephalic and atraumatic.  Eyes:     General:        Right eye: No discharge.        Left  eye: No discharge.  Neck:     Trachea: No tracheal deviation.  Cardiovascular:     Rate and Rhythm: Normal rate and regular rhythm.     Heart sounds: Normal heart sounds. No murmur.  Pulmonary:     Effort: Pulmonary effort is normal. No respiratory distress.     Breath sounds: Normal breath sounds.  Lymphadenopathy:     Cervical: No cervical adenopathy.  Skin:    General: Skin is warm and dry.  Neurological:     Mental Status: She is alert.     Coordination: Coordination normal.  Psychiatric:        Behavior: Behavior normal.   Surgical scar on her abdomen looks good not infected abdomen is soft  Some pedal edema. Patient has had lab work just recently.  We will be following this closely.  We plan on rechecking her lab work again in approximately  6 weeks when she follows up.     Assessment & Plan:  Adenocarcinoma has a follow-up scan coming up patient is not nervous she is very strong-willed and looks to continue to do well  Her appetite is picking up the energy level picking up she is using a cane to get around no falls we did talk about activity and exercise she does not want to do rehab because of fear of coronavirus.  Patient currently is able  to meet her needs and has family and friends to help her with food  Pedal edema is noted but not severe continue the Lasix and the potassium   25 minutes was spent with the patient.  This statement verifies that 25 minutes was indeed spent with the patient.  More than 50% of this visit-total duration of the visit-was spent in counseling and coordination of care. The issues that the patient came in for today as reflected in the diagnosis (s) please refer to documentation for further details.

## 2019-01-04 ENCOUNTER — Ambulatory Visit: Payer: Medicare Other | Admitting: Family Medicine

## 2019-01-05 ENCOUNTER — Encounter (HOSPITAL_COMMUNITY): Payer: Self-pay | Admitting: Hematology

## 2019-01-05 ENCOUNTER — Other Ambulatory Visit: Payer: Self-pay | Admitting: *Deleted

## 2019-01-05 LAB — SURGICAL PATHOLOGY

## 2019-01-05 NOTE — Patient Outreach (Signed)
[facility] [facility]) Care Management  01/05/2019  FELICIANA NARAYAN Jul 22, 1928 [id number]     Referral Date:12/26/2018 Referral Source:[facility] coordinator Date of Admission:12/15/2018 (to SNF) Diagnosis:Cancer of Cecum, s/p right colectomy Date of Discharge:10/132020 Facility:[facility] Insurance:[employer]  Outreach attempt #3, unsuccessful.  Call placed to member for transition of care assessment, no answer, unable to leave a message.  If no call back by 10/28 will close due to inability to establish contact.  Valente David, [place], MSN [place] [phone]

## 2019-01-09 ENCOUNTER — Telehealth: Payer: Self-pay | Admitting: Family Medicine

## 2019-01-09 ENCOUNTER — Other Ambulatory Visit (HOSPITAL_COMMUNITY): Payer: Self-pay | Admitting: Hematology

## 2019-01-09 MED ORDER — DIPHENOXYLATE-ATROPINE 2.5-0.025 MG PO TABS: 1.0000 | ORAL_TABLET | Freq: Four times a day (QID) | ORAL | 0 refills | Status: DC | PRN

## 2019-01-09 NOTE — Telephone Encounter (Signed)
It is actually very important that the patient do this CAT scan The actual CAT scan should not take long If she would like to have it rescheduled to the following week please assist patient having oncology rescheduled to next week

## 2019-01-09 NOTE — Telephone Encounter (Signed)
Patient said she doesn't want to do the CT Scan that is scheduled this week because she has constant diarrhea and doesn't feel like she can leave her house.  I told her she could call them and reschedule it but she said that it wouldn't do any good to reschedule because she always has diarrhea.  She didn't want me to cancel it until Dr. Nicki Reaper gave approval.

## 2019-01-09 NOTE — Telephone Encounter (Signed)
Patient advised that Dr Nicki Reaper does not think she should cancell her CT scan. Patient stated she will call and speak with her cancer doctor that ordered it and tell him what is going on and see what he advises

## 2019-01-10 ENCOUNTER — Other Ambulatory Visit: Payer: Self-pay | Admitting: *Deleted

## 2019-01-10 NOTE — Patient Outreach (Signed)
[place] [facility]) Care Management  01/10/2019  [name] December 29, 1928 [id number]   No response from member after multiple unsuccessful outreach attempts and letter sent.  Will close case at this time due to inability to maintain contact.  Will notify member and primary MD of case closure.  Valente David, [place], MSN [place] [phone]

## 2019-01-11 ENCOUNTER — Other Ambulatory Visit: Payer: Self-pay

## 2019-01-11 ENCOUNTER — Ambulatory Visit (HOSPITAL_COMMUNITY)
Admission: RE | Admit: 2019-01-11 | Discharge: 2019-01-11 | Disposition: A | Discharge status: Home / Self Care | Payer: Medicare Other | Source: Ambulatory Visit | Attending: Hematology | Admitting: Hematology

## 2019-01-11 DIAGNOSIS — J439 Emphysema, unspecified: Secondary | ICD-10-CM | POA: Diagnosis not present

## 2019-01-11 DIAGNOSIS — C50919 Malignant neoplasm of unspecified site of unspecified female breast: Secondary | ICD-10-CM | POA: Diagnosis not present

## 2019-01-11 DIAGNOSIS — C189 Malignant neoplasm of colon, unspecified: Secondary | ICD-10-CM | POA: Diagnosis not present

## 2019-01-11 MED ORDER — IOHEXOL 300 MG/ML  SOLN
60.0000 mL | Freq: Once | INTRAMUSCULAR | Status: AC | PRN
  Administered 2019-01-11: 14:00:00 60 mL via INTRAVENOUS

## 2019-01-14 ENCOUNTER — Telehealth: Payer: Self-pay | Admitting: Family Medicine

## 2019-01-14 NOTE — Telephone Encounter (Signed)
Patient called regarding her CAT scan we will review it and send her a message patient does have follow-up with hematology oncology in the second week in November

## 2019-01-23 NOTE — Telephone Encounter (Signed)
I have tried several times over several days to call the patient without success with connecting with her She will be seeing oncology within the next couple days and at that time they will discuss her scan

## 2019-01-24 ENCOUNTER — Other Ambulatory Visit (HOSPITAL_COMMUNITY): Payer: Self-pay | Admitting: Hematology

## 2019-01-24 ENCOUNTER — Ambulatory Visit: Payer: Medicare Other | Admitting: Urology

## 2019-01-24 DIAGNOSIS — R269 Unspecified abnormalities of gait and mobility: Secondary | ICD-10-CM | POA: Diagnosis not present

## 2019-01-24 DIAGNOSIS — M21061 Valgus deformity, not elsewhere classified, right knee: Secondary | ICD-10-CM | POA: Diagnosis not present

## 2019-01-24 DIAGNOSIS — M25661 Stiffness of right knee, not elsewhere classified: Secondary | ICD-10-CM | POA: Diagnosis not present

## 2019-01-24 DIAGNOSIS — Z7689 Persons encountering health services in other specified circumstances: Secondary | ICD-10-CM | POA: Diagnosis not present

## 2019-01-24 DIAGNOSIS — Z79899 Other long term (current) drug therapy: Secondary | ICD-10-CM | POA: Diagnosis not present

## 2019-01-24 DIAGNOSIS — M25461 Effusion, right knee: Secondary | ICD-10-CM | POA: Diagnosis not present

## 2019-01-24 DIAGNOSIS — Z0189 Encounter for other specified special examinations: Secondary | ICD-10-CM | POA: Diagnosis not present

## 2019-01-24 DIAGNOSIS — Z789 Other specified health status: Secondary | ICD-10-CM | POA: Diagnosis not present

## 2019-01-24 DIAGNOSIS — M25561 Pain in right knee: Secondary | ICD-10-CM | POA: Diagnosis not present

## 2019-01-24 DIAGNOSIS — M1711 Unilateral primary osteoarthritis, right knee: Secondary | ICD-10-CM | POA: Diagnosis not present

## 2019-01-25 ENCOUNTER — Encounter (HOSPITAL_COMMUNITY): Payer: Self-pay | Admitting: Hematology

## 2019-01-25 ENCOUNTER — Inpatient Hospital Stay (HOSPITAL_COMMUNITY): Payer: Medicare Other | Attending: Hematology | Admitting: Hematology

## 2019-01-25 ENCOUNTER — Other Ambulatory Visit: Payer: Self-pay

## 2019-01-25 ENCOUNTER — Encounter: Payer: Self-pay | Admitting: Family Medicine

## 2019-01-25 VITALS — BP 162/72 | HR 98 | Temp 97.5°F | Resp 18 | Wt 131.9 lb

## 2019-01-25 DIAGNOSIS — R911 Solitary pulmonary nodule: Secondary | ICD-10-CM | POA: Diagnosis not present

## 2019-01-25 DIAGNOSIS — Z800 Family history of malignant neoplasm of digestive organs: Secondary | ICD-10-CM | POA: Insufficient documentation

## 2019-01-25 DIAGNOSIS — Z87891 Personal history of nicotine dependence: Secondary | ICD-10-CM | POA: Insufficient documentation

## 2019-01-25 DIAGNOSIS — Z8551 Personal history of malignant neoplasm of bladder: Secondary | ICD-10-CM | POA: Diagnosis not present

## 2019-01-25 DIAGNOSIS — M8580 Other specified disorders of bone density and structure, unspecified site: Secondary | ICD-10-CM | POA: Insufficient documentation

## 2019-01-25 DIAGNOSIS — C189 Malignant neoplasm of colon, unspecified: Secondary | ICD-10-CM

## 2019-01-25 DIAGNOSIS — E119 Type 2 diabetes mellitus without complications: Secondary | ICD-10-CM | POA: Insufficient documentation

## 2019-01-25 DIAGNOSIS — Z79899 Other long term (current) drug therapy: Secondary | ICD-10-CM | POA: Insufficient documentation

## 2019-01-25 DIAGNOSIS — Z923 Personal history of irradiation: Secondary | ICD-10-CM | POA: Insufficient documentation

## 2019-01-25 DIAGNOSIS — C180 Malignant neoplasm of cecum: Secondary | ICD-10-CM | POA: Insufficient documentation

## 2019-01-25 DIAGNOSIS — C50911 Malignant neoplasm of unspecified site of right female breast: Secondary | ICD-10-CM | POA: Insufficient documentation

## 2019-01-25 DIAGNOSIS — I10 Essential (primary) hypertension: Secondary | ICD-10-CM | POA: Insufficient documentation

## 2019-01-25 DIAGNOSIS — Z170 Estrogen receptor positive status [ER+]: Secondary | ICD-10-CM | POA: Insufficient documentation

## 2019-01-25 MED ORDER — DIPHENOXYLATE-ATROPINE 2.5-0.025 MG PO TABS: ORAL_TABLET | ORAL | 3 refills | Status: DC

## 2019-01-25 NOTE — Telephone Encounter (Signed)
Tried multiple times speak with patient unable to do so we did send her a letter

## 2019-01-25 NOTE — Progress Notes (Signed)
[place], [place] [address]   CLINIC:  Medical Oncology/Hematology  PCP:  Kathyrn Drown, MD [address] [place] [address] [phone]   REASON FOR VISIT: Newly diagnosed cecal cancer and history of right breast cancer and left ureteral cancer.  CURRENT THERAPY:  Anastrozole.  BRIEF ONCOLOGIC HISTORY:  Oncology History  Breast cancer, right breast ([place])  04/22/2015 Procedure   Right breast needle core biopsy   04/24/2015 Pathology Results   Invasive ductal carcinoma, grade 1.  ER100%, PR NEGATIVE, Ki-67 10%, HER2 NEGATIVE   05/23/2015 Procedure   Right breast lumpectomy by Dr. Dalbert Batman   05/27/2015 Pathology Results   Breast, lumpectomy, Right - INVASIVE GRADE I DUCTAL CARCINOMA SPANNING 0.7 CM IN GREATEST LINEAR DIMENSION. - ASSOCIATED LOW GRADE DUCTAL CARCINOMA IN SITU. - TUMOR SHOWS ASSOCIATED CALCIFICATIONS. - LOBULAR CARCINOMA IN SITU WITH ASSOCIATED CALCIFICATIONS. - MARGINS ARE NEGATIVE.    - 07/23/2015 Radiation Therapy   Dr. Pablo Ledger   08/01/2015 Imaging   Bone density- BMD as determined from Femur Neck Left is 0.891 g/cm2 with a T-Score of -1.1. This patient is considered osteopenic according to [facility]) criteria.   08/14/2015 -  Anti-estrogen oral therapy   Arimidex   08/02/2016 Imaging   Bone density- This patient is considered osteopenic according to [facility]) criteria.       CANCER STAGING: Cancer Staging Breast cancer, right breast ([place]) Staging form: Breast, AJCC 8th Edition - Pathologic stage from 05/27/2015: Stage IB (pT1b, pN0, cM0, G1, ER: Positive, PR: Negative, HER2: Negative) - Signed by Baird Cancer, PA-C on 03/25/2016  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 Staging form: Colon and Rectum, AJCC 8th Edition - Clinical: Stage IIC (cT4b, cN0, cM0) - Unsigned  Cecal cancer ([place]) Staging form: Colon and Rectum, AJCC 8th  Edition - Clinical: Stage IIC (cT4b, cN0, cM0) - Unsigned    INTERVAL HISTORY:  Ms. Mcmartin 85 y.o. female seen for follow-up of her cecal adenocarcinoma.  She underwent a CT scan of the chest on 01/11/2019.  She is having accidents when she has bowel movements.  Appetite is 100%.  Energy levels are 50%.  Leg swelling is stable.  Occasional diarrhea present since surgery.  No new pains reported.  Weight is 131.9 pounds and stable.  Denies any fevers or chills.  No infections reported.  REVIEW OF SYSTEMS:  Review of Systems  Cardiovascular: Positive for leg swelling.  Gastrointestinal: Positive for diarrhea.  All other systems reviewed and are negative.    PAST MEDICAL/SURGICAL HISTORY:  Past Medical History:  Diagnosis Date   Breast cancer ([place])    Breast cancer, right breast ([place]) 05/01/2015   Cancer ([place])    BREAST CANCER    Colon polyps    COPD (chronic obstructive pulmonary disease) ([place])    DR. Lake Bells   Diabetes mellitus without complication ([place])    PREDIABETIC  CONTROL W/ DIET   Hypercholesteremia    Hypertension    Lung nodules    Osteoarthritis    Osteopenia 09/18/2013   Osteoporosis    Personal history of radiation therapy    PONV (postoperative nausea and vomiting)    ' i get so sick with anesthesia"    Shortness of breath dyspnea    W/ EXERTION   Tick bite    3 years ago    Past Surgical History:  Procedure Laterality Date   ABDOMINAL HYSTERECTOMY  APPENDECTOMY     BARTHOLIN GLAND CYST EXCISION     BIOPSY  12/06/2018   Procedure: BIOPSY;  Surgeon: Rogene Houston, MD;  [facility];  Service: Endoscopy;;  cecal mass biopsies    BREAST EXCISIONAL BIOPSY Left    benign   BREAST LUMPECTOMY WITH RADIOACTIVE SEED LOCALIZATION Right 05/23/2015   Procedure: RIGHT BREAST LUMPECTOMY WITH RADIOACTIVE SEED LOCALIZATION;  Surgeon: Fanny Skates, MD;  Location: [place];  Service: General;  Laterality: Right;   BREAST SURGERY      2000  LEFT BREAST  LUMP REMOVED   cataract surgeries  Bilateral    COLON RESECTION N/A 12/08/2018   Procedure: OPEN SEGMENTAL COLON RESECTION;  Surgeon: Fanny Skates, MD;  Location: [facility];  Service: General;  Laterality: N/A;   colonoscopy     COLONOSCOPY  03/25/2011   Procedure: COLONOSCOPY;  Surgeon: Rogene Houston, MD;  [facility];  Service: Endoscopy;  Laterality: N/A;  9:30 am   COLONOSCOPY N/A 12/06/2018   Procedure: COLONOSCOPY;  Surgeon: Rogene Houston, MD;  [facility];  Service: Endoscopy;  Laterality: N/A;   CYSTOSCOPY W/ URETERAL STENT PLACEMENT Left 02/27/2018   Procedure: CYSTOSCOPY WITH LEFT RETROGRADE PYELOGRAM AND lEFT URETEROSCOPY WITH BIOPSY AND LEFT URETERAL STENT PLACEMENT;  Surgeon: Cleon Gustin, MD;  Location: [facility];  Service: Urology;  Laterality: Left;   CYSTOSCOPY W/ URETERAL STENT PLACEMENT Right 12/08/2018   Procedure: CYSTOSCOPY WITH RETROGRADE PYELOGRAM/URETERAL STENT PLACEMENT;  Surgeon: Raynelle Bring, MD;  [facility];  Service: Urology;  Laterality: Right;   left inguinal hernia repair     multiple toe surgeries     ROBOT ASSITED LAPAROSCOPIC NEPHROURETERECTOMY Left 05/11/2018   Procedure: XI ROBOT ASSITED LAPAROSCOPIC LEFT DISTAL URETERECTOMY, URETERAL  REIMPLANTATION, WITH PSOAS HITCH AND BOARI FLAP. PELVIC LYMPH NODE DISSCETION AND LEFT URETERAL STENT PLACEMENT;  Surgeon: Cleon Gustin, MD;  Location[facility];  Service: Urology;  Laterality: Left;   TONSILLECTOMY       SOCIAL HISTORY:  Social History   Socioeconomic History   Marital status: Widowed    Spouse name: Not on file   Number of children: Not on file   Years of education: Not on file   Highest education level: Not on file  Occupational History   Not on file  Social Needs   Financial resource strain: Not on file   Food insecurity    Worry: Never true    Inability: Never true   Transportation needs    Medical: No     Non-medical: No  Tobacco Use   Smoking status: Former Smoker    Packs/day: 2.00    Years: 50.00    Pack years: 100.00    Types: Cigarettes    Quit date: 03/15/1988    Years since quitting: 30.8   Smokeless tobacco: Never Used  Substance and Sexual Activity   Alcohol use: Yes    Alcohol/week: 0.0 standard drinks    Comment: seldom    Drug use: No   Sexual activity: Not on file  Lifestyle   Physical activity    Days per week: Not on file    Minutes per session: Not on file   Stress: Not on file  Relationships   Social connections    Talks on phone: Not on file    Gets together: Not on file    Attends religious service: Not on file    Active member of club or organization: Not on  file    Attends meetings of clubs or organizations: Not on file    Relationship status: Not on file   Intimate partner violence    Fear of current or ex partner: Not on file    Emotionally abused: Not on file    Physically abused: Not on file    Forced sexual activity: Not on file  Other Topics Concern   Not on file  Social History Narrative   Not on file    FAMILY HISTORY:  Family History  Problem Relation Age of Onset   Colon cancer Father     CURRENT MEDICATIONS:  Outpatient Encounter Medications as of 01/25/2019  Medication Sig   amLODipine (NORVASC) 5 MG tablet TAKE ONE TABLET BY MOUTH ONCE DAILY. (Patient taking differently: Take 5 mg by mouth daily. )   anastrozole (ARIMIDEX) 1 MG tablet Take 1 tablet (1 mg total) by mouth daily. (Patient taking differently: Take 1 mg by mouth every evening. )   diphenoxylate-atropine (LOMOTIL) 2.5-0.025 MG tablet TAKE 1 TABLET BY MOUTH 4 TIMES DAILY AS NEEDED FOR DIARRHEA OR LOOSE STOOLS   furosemide (LASIX) 20 MG tablet Take one tablet qam as needed for swelling   Multiple Vitamins-Minerals (MULTIVITAMIN WITH MINERALS) tablet Take 1 tablet by mouth daily.   nortriptyline (PAMELOR) 10 MG capsule TAKE 2 TO 3 CAPSULES BY MOUTH NIGHTLY.  (Patient taking differently: Take 20-30 mg by mouth at bedtime. )   [DISCONTINUED] diphenoxylate-atropine (LOMOTIL) 2.5-0.025 MG tablet TAKE 1 TABLET BY MOUTH 4 TIMES DAILY AS NEEDED FOR DIARRHEA OR LOOSE STOOLS   fluticasone (FLONASE) 50 MCG/ACT nasal spray Place 2 sprays into both nostrils daily as needed for allergies.    hydrocortisone (ANUSOL-HC) 2.5 % rectal cream APPLY TO AFFECTED AREASTTWICE DAILY AS NEEDED FOR RECTAIL PAIN AND ITCHING.   levocetirizine (XYZAL) 5 MG tablet Take 5 mg by mouth daily as needed for allergies.    OXYGEN Inhale 2 L into the lungs. As needed for activities requiring exertion, per patient uses very sparingly   potassium chloride (KLOR-CON) 10 MEQ tablet Take one tablet bid on the days you take lasix ( furosemide) (Patient not taking: Reported on 01/25/2019)   PROAIR RESPICLICK 482 (90 Base) MCG/ACT AEPB INHALE 1 PUFF INTO THE LUNGS EVERY 4 HOURS AS NEEDED. (Patient not taking: No sig reported)   STIOLTO RESPIMAT 2.5-2.5 MCG/ACT AERS INHALE 2 PUFFS INTO THE LUNGS ONCE DAILY. (Patient not taking: No sig reported)   No facility-administered encounter medications on file as of 01/25/2019.     ALLERGIES:  Allergies  Allergen Reactions   Augmentin [Amoxicillin-Pot Clavulanate] Nausea And Vomiting    Has patient had a PCN reaction causing immediate rash, facial/tongue/throat swelling, SOB or lightheadedness with hypotension: No Has patient had a PCN reaction causing severe rash involving mucus membranes or skin necrosis: No Has patient had a PCN reaction that required hospitalization: No Has patient had a PCN reaction occurring within the last 10 years: Unknown If all of the above answers are "NO", then may proceed with Cephalosporin use.     Amoxicillin Diarrhea    Has patient had a PCN reaction causing immediate rash, facial/tongue/throat swelling, SOB or lightheadedness with hypotension: No Has patient had a PCN reaction causing severe rash involving  mucus membranes or skin necrosis: No Has patient had a PCN reaction that required hospitalization: No Has patient had a PCN reaction occurring within the last 10 years: Unknown If all of the above answers are "NO", then may proceed with Cephalosporin  use.    Codeine Nausea And Vomiting   Morphine Nausea And Vomiting   Neomycin-Bacitracin Zn-Polymyx Itching and Rash    Unable to use Neosporin     PHYSICAL EXAM:  ECOG Performance status: 1  Vitals:   01/25/19 1219  BP: (!) 162/72  Pulse: 98  Resp: 18  Temp: (!) 97.5 F (36.4 C)  SpO2: 96%   Filed Weights   01/25/19 1219  Weight: 131 lb 14.4 oz (59.8 kg)    Physical Exam Constitutional:      Appearance: Normal appearance. She is normal weight.  Cardiovascular:     Rate and Rhythm: Normal rate and regular rhythm.     Heart sounds: Normal heart sounds.  Pulmonary:     Effort: Pulmonary effort is normal.     Breath sounds: Normal breath sounds.  Abdominal:     General: There is no distension.     Palpations: Abdomen is soft. There is no mass.  Musculoskeletal: Normal range of motion.  Skin:    General: Skin is warm and dry.  Neurological:     Mental Status: She is alert and oriented to person, place, and time. Mental status is at baseline.  Psychiatric:        Mood and Affect: Mood normal.        Behavior: Behavior normal.   Midline suture in the abdomen is well-healed.  Steri-Strips in place.   LABORATORY DATA:  I have reviewed the labs as listed.  CBC    Component Value Date/Time   WBC 7.9 12/26/2018 1543   RBC 3.51 (L) 12/26/2018 1543   HGB 9.7 (L) 12/26/2018 1543   HGB 8.3 (L) 10/09/2018 0918   HCT 32.3 (L) 12/26/2018 1543   HCT 26.1 (L) 10/09/2018 0918   PLT 433 (H) 12/26/2018 1543   PLT 373 10/09/2018 0918   MCV 92.0 12/26/2018 1543   MCV 92 10/09/2018 0918   MCH 27.6 12/26/2018 1543   MCHC 30.0 12/26/2018 1543   RDW 17.1 (H) 12/26/2018 1543   RDW 12.7 10/09/2018 0918   LYMPHSABS 1.4  12/26/2018 1543   LYMPHSABS 1.5 10/09/2018 0918   MONOABS 0.6 12/26/2018 1543   EOSABS 0.3 12/26/2018 1543   EOSABS 0.2 10/09/2018 0918   BASOSABS 0.1 12/26/2018 1543   BASOSABS 0.0 10/09/2018 0918   CMP Latest Ref Rng & Units 12/26/2018 12/15/2018 12/13/2018  Glucose 70 - 99 mg/dL 118(H) - 92  BUN 8 - 23 mg/dL 15 - 19  Creatinine 0.44 - 1.00 mg/dL 1.54(H) 1.56(H) 1.47(H)  Sodium 135 - 145 mmol/L 140 - 140  Potassium 3.5 - 5.1 mmol/L 3.2(L) - 3.7  Chloride 98 - 111 mmol/L 110 - 111  CO2 22 - 32 mmol/L 19(L) - 21(L)  Calcium 8.9 - 10.3 mg/dL 9.1 - 7.8(L)  Total Protein 6.5 - 8.1 g/dL 6.7 - -  Total Bilirubin 0.3 - 1.2 mg/dL 0.1(L) - -  Alkaline Phos 38 - 126 U/L 81 - -  AST 15 - 41 U/L 19 - -  ALT 0 - 44 U/L 22 - -       DIAGNOSTIC IMAGING:  I have independently reviewed the scans and discussed with the patient.    ASSESSMENT & PLAN:   Cecal cancer ([place]) 1.  Stage II (T4BN0) cecal adenocarcinoma: -CTAP on 12/04/2018 showed thickening of the wall of the cecum. - Colonoscopy on 12/06/2018 showed mass in the cecum. - Right hemicolectomy on 12/08/2018 showed moderately differentiated adenocarcinoma, margins clear, 0/16 lymph nodes positive,  LVSI positive.  Preoperative CEA of 12.8.  CEA on 12/26/2018 came down to 10.3. - Loss of MLH1 and PMS2 seen.  BRAF mutation testing was positive for V600 E. -She does not require any adjuvant chemotherapy. -We reviewed results of the CT of the chest with contrast dated 01/11/2019 which showed progressive subsolid nodule within the posterolateral 2.9 x 1.6 x 2.6 cm.  This is predominantly cystic in appearance with progressive solid component along the superior margin of this nodule measuring 0.8 cm.  She was followed for this nodule by Dr. Lake Bells. -We discussed various options including doing a PET scan versus watchful waiting in 3 months with repeat scan.  She opted for the second option.  We will schedule her for CT scan in 3 months along with  repeat CEA level. -She is having some diarrhea with accidents.  She is very embarrassed about it.  We have given a refill for Lomotil to be taken 4 times a day as needed.  2.  Stage I right breast cancer: - Right breast lumpectomy on 05/23/2015, 0.7 cm IDC, grade 1, associated low-grade DCIS, margins negative, ER positive, PR negative, HER-2 negative, Ki-67 10%. -Anastrozole started in June 2017, she is continuing to tolerate with occasional hot flashes. -Mammogram on 09/19/2018 was BI-RADS Category 2.  3.  Osteopenia: -DEXA scan on 08/02/2016 shows T score of -1.3.  DEXA scan on 10/12/2018 shows T score of -2.4. - She was treated with Fosamax which was discontinued in 2019. - Prolia was started on 11/30/2018.  She will continue calcium and vitamin D.  4.  Urothelial carcinoma, high-grade of the left distal ureter: -He underwent left distal ureteral resection on 05/11/2018 by Dr. Alyson Ingles. - Pathology showed PT2PN0 high-grade urothelial carcinoma, 0 out of 1 lymph node involved.     Total time spent is 25 minutes with more than 50% of the time spent face-to-face discussing scan results, further options, counseling and coordination of care.    Orders placed this encounter:  Orders Placed This Encounter  Procedures   CT Chest W Contrast   CBC with Differential/Platelet   Comprehensive metabolic panel   CEA      Derek Jack, MD [place] [phone]

## 2019-01-25 NOTE — Assessment & Plan Note (Addendum)
1.  Stage II (T4BN0) cecal adenocarcinoma: -CTAP on 12/04/2018 showed thickening of the wall of the cecum. - Colonoscopy on 12/06/2018 showed mass in the cecum. - Right hemicolectomy on 12/08/2018 showed moderately differentiated adenocarcinoma, margins clear, 0/16 lymph nodes positive, LVSI positive.  Preoperative CEA of 12.8.  CEA on 12/26/2018 came down to 10.3. - Loss of MLH1 and PMS2 seen.  BRAF mutation testing was positive for V600 E. -She does not require any adjuvant chemotherapy. -We reviewed results of the CT of the chest with contrast dated 01/11/2019 which showed progressive subsolid nodule within the posterolateral 2.9 x 1.6 x 2.6 cm.  This is predominantly cystic in appearance with progressive solid component along the superior margin of this nodule measuring 0.8 cm.  She was followed for this nodule by Dr. Lake Bells. -We discussed various options including doing a PET scan versus watchful waiting in 3 months with repeat scan.  She opted for the second option.  We will schedule her for CT scan in 3 months along with repeat CEA level. -She is having some diarrhea with accidents.  She is very embarrassed about it.  We have given a refill for Lomotil to be taken 4 times a day as needed.  2.  Stage I right breast cancer: - Right breast lumpectomy on 05/23/2015, 0.7 cm IDC, grade 1, associated low-grade DCIS, margins negative, ER positive, PR negative, HER-2 negative, Ki-67 10%. -Anastrozole started in June 2017, she is continuing to tolerate with occasional hot flashes. -Mammogram on 09/19/2018 was BI-RADS Category 2.  3.  Osteopenia: -DEXA scan on 08/02/2016 shows T score of -1.3.  DEXA scan on 10/12/2018 shows T score of -2.4. - She was treated with Fosamax which was discontinued in 2019. - Prolia was started on 11/30/2018.  She will continue calcium and vitamin D.  4.  Urothelial carcinoma, high-grade of the left distal ureter: -He underwent left distal ureteral resection on 05/11/2018 by Dr.  Alyson Ingles. - Pathology showed PT2PN0 high-grade urothelial carcinoma, 0 out of 1 lymph node involved.

## 2019-01-25 NOTE — Patient Instructions (Addendum)
[facility] at [facility] Discharge Instructions  You were seen today by Dr. Katragadda. He went over your recent lab and scan results. He will see you back in 3 months for labs, scan and follow up.   Thank you for choosing [facility] at [facility] to provide your oncology and hematology care.  To afford each patient quality time with our provider, please arrive at least 15 minutes before your scheduled appointment time.   If you have a lab appointment with the Cancer Center please come in thru the  Main Entrance and check in at the main information desk  You need to re-schedule your appointment should you arrive 10 or more minutes late.  We strive to give you quality time with our providers, and arriving late affects you and other patients whose appointments are after yours.  Also, if you no show three or more times for appointments you may be dismissed from the clinic at the providers discretion.     Again, thank you for choosing [facility].  Our hope is that these requests will decrease the amount of time that you wait before being seen by our physicians.       _____________________________________________________________  Should you have questions after your visit to [facility], please contact our office at [phone] between the hours of 8:00 a.m. and 4:30 p.m.  Voicemails left after 4:00 p.m. will not be returned until the following business day.  For prescription refill requests, have your pharmacy contact our office and allow 72 hours.    Cancer Center Support Programs:   > Cancer Support Group  2nd Tuesday of the month 1pm-2pm, Journey Room    

## 2019-01-31 DIAGNOSIS — M25461 Effusion, right knee: Secondary | ICD-10-CM | POA: Diagnosis not present

## 2019-01-31 DIAGNOSIS — M25561 Pain in right knee: Secondary | ICD-10-CM | POA: Diagnosis not present

## 2019-01-31 DIAGNOSIS — M1711 Unilateral primary osteoarthritis, right knee: Secondary | ICD-10-CM | POA: Diagnosis not present

## 2019-02-06 DIAGNOSIS — M1711 Unilateral primary osteoarthritis, right knee: Secondary | ICD-10-CM | POA: Diagnosis not present

## 2019-02-06 DIAGNOSIS — M25461 Effusion, right knee: Secondary | ICD-10-CM | POA: Diagnosis not present

## 2019-02-06 DIAGNOSIS — M25561 Pain in right knee: Secondary | ICD-10-CM | POA: Diagnosis not present

## 2019-02-14 ENCOUNTER — Other Ambulatory Visit: Payer: Self-pay

## 2019-02-14 ENCOUNTER — Ambulatory Visit (INDEPENDENT_AMBULATORY_CARE_PROVIDER_SITE_OTHER): Payer: Medicare Other | Admitting: Family Medicine

## 2019-02-14 DIAGNOSIS — R197 Diarrhea, unspecified: Secondary | ICD-10-CM | POA: Diagnosis not present

## 2019-02-14 DIAGNOSIS — R630 Anorexia: Secondary | ICD-10-CM

## 2019-02-14 NOTE — Progress Notes (Signed)
   Subjective:    Patient ID: [name], female    DOB: [birth year], 85 y.o.   MRN: [id number]  HPI Pt states she has had diarrhea bad since Thanksgiving day; last night did get better. Pt is not able to eat/drink a lot of things. Pt can eat toast and drink water. Pt cant eat eggs or anything greasy. Pt has not been checking blood pressure.  Unfortunately patient has had a lot of problems with diarrhea.  She has tried Imodium without much success she also tried what sounded like Questran.  Unfortunately she just not getting any success in addition to this her specialist did prescribe Lomotil to her but she did not want to take it because it could potentially be habit-forming she denies abdominal pain bloody stools. Virtual Visit via Telephone Note  I connected with [name] on 02/14/19 at  2:00 PM EST by telephone and verified that I am speaking with the correct person using two identifiers.  Location: Patient: home Provider: office   I discussed the limitations, risks, security and privacy concerns of performing an evaluation and management service by telephone and the availability of in person appointments. I also discussed with the patient that there may be a patient responsible charge related to this service. The patient expressed understanding and agreed to proceed.   History of Present Illness:    Observations/Objective:   Assessment and Plan:   Follow Up Instructions:    I discussed the assessment and treatment plan with the patient. The patient was provided an opportunity to ask questions and all were answered. The patient agreed with the plan and demonstrated an understanding of the instructions.   The patient was advised to call back or seek an in-person evaluation if the symptoms worsen or if the condition fails to improve as anticipated.  I provided 17 minutes of non-face-to-face time during this encounter.   Vicente Males, LPN   Review of Systems   Constitutional: Negative for activity change, fatigue and fever.  HENT: Negative for congestion and rhinorrhea.   Respiratory: Negative for cough, chest tightness and shortness of breath.   Cardiovascular: Negative for chest pain and leg swelling.  Gastrointestinal: Positive for diarrhea. Negative for abdominal pain and nausea.  Skin: Negative for color change.  Neurological: Negative for dizziness and headaches.  Psychiatric/Behavioral: Negative for agitation and behavioral problems.   Fall Risk  01/03/2019 12/04/2018 10/03/2018 06/28/2017 10/25/2016  Falls in the past year? 0 0 1 No No  Comment - - - - -  Number falls in past yr: - 0 0 - -  Injury with Fall? - 0 0 - -  Risk for fall due to : Impaired balance/gait;Impaired mobility - - - -  Follow up Falls evaluation completed - - - -        Objective:   Physical Exam Today's visit was via telephone Physical exam was not possible for this visit   Currently patient does not want to do stool studies     Assessment & Plan:  Diarrhea Probably functional related to the surgery she had Certainly if it keeps giving her trouble getting worse she will need to do stool studies.  Patient does not want to do Imodium or Lomotil but she states she might try the Imodium again she will follow-up with [place] again in 3 weeks time if things are having difficult time for her we will do a in person visit otherwise virtual visit

## 2019-02-15 ENCOUNTER — Other Ambulatory Visit (HOSPITAL_COMMUNITY): Payer: Medicare Other

## 2019-02-22 ENCOUNTER — Ambulatory Visit (HOSPITAL_COMMUNITY): Payer: Medicare Other | Admitting: Hematology

## 2019-02-23 ENCOUNTER — Telehealth: Payer: Self-pay | Admitting: Pulmonary Disease

## 2019-02-23 NOTE — Telephone Encounter (Signed)
Patient is a BQ patient who will need to get established with another provider.   Patient is scheduled for a CT with contrast in February 2021. BQ had ordered a CT w/o contrast back in February 2020 (last time patient was seen).    BQ, please advise if you ok with this and if you have any recommendations for a new provider for her. Thanks!

## 2019-02-23 NOTE — Telephone Encounter (Signed)
CT without contrast is fine  Icard, Kristen Loader

## 2019-02-26 NOTE — Telephone Encounter (Signed)
Just to clarify Dr. Delton Coombes ordered the CT with contrast, does a separate order for a CT without contrast need to be placed, or is the CT with contrast good for what you need Dr. Lake Bells.   Called patient set up appt, unable to reach  Will route to BQ to clarify if patient needs another CT without contrast

## 2019-02-26 NOTE — Telephone Encounter (Signed)
No need to order a separate CT

## 2019-02-27 NOTE — Telephone Encounter (Signed)
Called and spoke to pt. Appt made with Dr. Shearon Stalls for 03/27/2019. Pt states she would prefer to wait for any scans until after seeing Dr. Shearon Stalls. Pt had a CT chest with contrast in 12/2018 by her oncologist. Nothing further needed at this time.   Will forward to Dr. Shearon Stalls as Juluis Rainier.

## 2019-03-01 ENCOUNTER — Telehealth: Payer: Self-pay | Admitting: Family Medicine

## 2019-03-01 NOTE — Telephone Encounter (Signed)
Spoke with patient and pt asked could we fax over the list to Mr.Reubin Milan. Faxed the list over and pt verbalized understanding

## 2019-03-01 NOTE — Telephone Encounter (Signed)
Nurses please see sheet It is fine to let [name] know the list of her diagnosis and her medication list

## 2019-03-01 NOTE — Telephone Encounter (Signed)
Insurance agent Mr.Zeller is going to try to get insurance through a new company. Mr.Zeller is needing to know if pt has been diagnosed with diabetes or prediabetes. Pt states she gives [place] permission to speak with Mr.Zeller and get him info so he can get her a new plan. Please advise. Thank you

## 2019-03-01 NOTE — Telephone Encounter (Signed)
[name] just dropped off a note for Dr. Nicki Reaper regarding patient's insurance.  Put in [employer]

## 2019-03-01 NOTE — Telephone Encounter (Signed)
Diagnosis list was faxed off to [place] office

## 2019-03-01 NOTE — Telephone Encounter (Signed)
Patient said they are trying to cancel her hospital insurance and she needs to know if we have diagnosed her with diabetes or pre-diabetes. (I checked her coding and she did have prediabetes as a diagnosis code in Oct. 2019)  I am not really sure what she needs, patient seemed confused.

## 2019-03-02 NOTE — Telephone Encounter (Signed)
He may also benefit from having her medication list as well thank you

## 2019-03-02 NOTE — Telephone Encounter (Signed)
Contacted [name] and he states that he would not need her med list. He did receive pt diagnosis list yesterday. He states he will not be able to get her in the program that he was hoping to

## 2019-03-07 ENCOUNTER — Ambulatory Visit: Payer: Medicare Other | Admitting: Urology

## 2019-03-07 ENCOUNTER — Ambulatory Visit: Payer: Medicare Other | Admitting: Nutrition

## 2019-03-08 ENCOUNTER — Inpatient Hospital Stay (HOSPITAL_COMMUNITY)
Admission: EM | Admit: 2019-03-08 | Discharge: 2019-03-12 | DRG: 389 | Disposition: A | Discharge status: Home Health | Payer: Medicare Other | Attending: Family Medicine | Admitting: Family Medicine

## 2019-03-08 ENCOUNTER — Emergency Department (HOSPITAL_COMMUNITY): Payer: Medicare Other

## 2019-03-08 ENCOUNTER — Encounter (HOSPITAL_COMMUNITY): Payer: Self-pay | Admitting: *Deleted

## 2019-03-08 DIAGNOSIS — Z79811 Long term (current) use of aromatase inhibitors: Secondary | ICD-10-CM | POA: Diagnosis not present

## 2019-03-08 DIAGNOSIS — C180 Malignant neoplasm of cecum: Secondary | ICD-10-CM | POA: Diagnosis present

## 2019-03-08 DIAGNOSIS — R410 Disorientation, unspecified: Secondary | ICD-10-CM | POA: Diagnosis not present

## 2019-03-08 DIAGNOSIS — C662 Malignant neoplasm of left ureter: Secondary | ICD-10-CM | POA: Diagnosis present

## 2019-03-08 DIAGNOSIS — C50411 Malignant neoplasm of upper-outer quadrant of right female breast: Secondary | ICD-10-CM | POA: Diagnosis present

## 2019-03-08 DIAGNOSIS — K56609 Unspecified intestinal obstruction, unspecified as to partial versus complete obstruction: Secondary | ICD-10-CM | POA: Diagnosis present

## 2019-03-08 DIAGNOSIS — Z880 Allergy status to penicillin: Secondary | ICD-10-CM

## 2019-03-08 DIAGNOSIS — E1122 Type 2 diabetes mellitus with diabetic chronic kidney disease: Secondary | ICD-10-CM | POA: Diagnosis present

## 2019-03-08 DIAGNOSIS — Z8551 Personal history of malignant neoplasm of bladder: Secondary | ICD-10-CM

## 2019-03-08 DIAGNOSIS — Z20828 Contact with and (suspected) exposure to other viral communicable diseases: Secondary | ICD-10-CM | POA: Diagnosis present

## 2019-03-08 DIAGNOSIS — R1084 Generalized abdominal pain: Secondary | ICD-10-CM | POA: Diagnosis not present

## 2019-03-08 DIAGNOSIS — Z800 Family history of malignant neoplasm of digestive organs: Secondary | ICD-10-CM

## 2019-03-08 DIAGNOSIS — Z79899 Other long term (current) drug therapy: Secondary | ICD-10-CM

## 2019-03-08 DIAGNOSIS — Z9013 Acquired absence of bilateral breasts and nipples: Secondary | ICD-10-CM | POA: Diagnosis not present

## 2019-03-08 DIAGNOSIS — J9611 Chronic respiratory failure with hypoxia: Secondary | ICD-10-CM | POA: Diagnosis present

## 2019-03-08 DIAGNOSIS — R109 Unspecified abdominal pain: Secondary | ICD-10-CM

## 2019-03-08 DIAGNOSIS — Z9049 Acquired absence of other specified parts of digestive tract: Secondary | ICD-10-CM | POA: Diagnosis not present

## 2019-03-08 DIAGNOSIS — C679 Malignant neoplasm of bladder, unspecified: Secondary | ICD-10-CM | POA: Diagnosis present

## 2019-03-08 DIAGNOSIS — I10 Essential (primary) hypertension: Secondary | ICD-10-CM | POA: Diagnosis present

## 2019-03-08 DIAGNOSIS — Z923 Personal history of irradiation: Secondary | ICD-10-CM | POA: Diagnosis not present

## 2019-03-08 DIAGNOSIS — Z885 Allergy status to narcotic agent status: Secondary | ICD-10-CM

## 2019-03-08 DIAGNOSIS — C50911 Malignant neoplasm of unspecified site of right female breast: Secondary | ICD-10-CM | POA: Diagnosis present

## 2019-03-08 DIAGNOSIS — R451 Restlessness and agitation: Secondary | ICD-10-CM | POA: Diagnosis not present

## 2019-03-08 DIAGNOSIS — N1832 Chronic kidney disease, stage 3b: Secondary | ICD-10-CM | POA: Diagnosis present

## 2019-03-08 DIAGNOSIS — E860 Dehydration: Secondary | ICD-10-CM | POA: Diagnosis present

## 2019-03-08 DIAGNOSIS — J439 Emphysema, unspecified: Secondary | ICD-10-CM | POA: Diagnosis present

## 2019-03-08 DIAGNOSIS — E876 Hypokalemia: Secondary | ICD-10-CM | POA: Diagnosis present

## 2019-03-08 DIAGNOSIS — I129 Hypertensive chronic kidney disease with stage 1 through stage 4 chronic kidney disease, or unspecified chronic kidney disease: Secondary | ICD-10-CM | POA: Diagnosis present

## 2019-03-08 DIAGNOSIS — K565 Intestinal adhesions [bands], unspecified as to partial versus complete obstruction: Principal | ICD-10-CM | POA: Diagnosis present

## 2019-03-08 DIAGNOSIS — Z90710 Acquired absence of both cervix and uterus: Secondary | ICD-10-CM

## 2019-03-08 DIAGNOSIS — Z170 Estrogen receptor positive status [ER+]: Secondary | ICD-10-CM

## 2019-03-08 DIAGNOSIS — N179 Acute kidney failure, unspecified: Secondary | ICD-10-CM | POA: Diagnosis present

## 2019-03-08 DIAGNOSIS — C50419 Malignant neoplasm of upper-outer quadrant of unspecified female breast: Secondary | ICD-10-CM

## 2019-03-08 DIAGNOSIS — Z87891 Personal history of nicotine dependence: Secondary | ICD-10-CM

## 2019-03-08 DIAGNOSIS — Z8554 Personal history of malignant neoplasm of ureter: Secondary | ICD-10-CM

## 2019-03-08 LAB — URINALYSIS, ROUTINE W REFLEX MICROSCOPIC
Bilirubin Urine: NEGATIVE
Glucose, UA: NEGATIVE mg/dL
Hgb urine dipstick: NEGATIVE
Ketones, ur: 5 mg/dL — AB
Leukocytes,Ua: NEGATIVE
Nitrite: NEGATIVE
Protein, ur: NEGATIVE mg/dL
Specific Gravity, Urine: 1.016 (ref 1.005–1.030)
pH: 5 (ref 5.0–8.0)

## 2019-03-08 LAB — CBC WITH DIFFERENTIAL/PLATELET
Abs Immature Granulocytes: 0.02 10*3/uL (ref 0.00–0.07)
Basophils Absolute: 0 10*3/uL (ref 0.0–0.1)
Basophils Relative: 0 %
Eosinophils Absolute: 0.1 10*3/uL (ref 0.0–0.5)
Eosinophils Relative: 2 %
HCT: 32.3 % — ABNORMAL LOW (ref 36.0–46.0)
Hemoglobin: 10.1 g/dL — ABNORMAL LOW (ref 12.0–15.0)
Immature Granulocytes: 0 %
Lymphocytes Relative: 16 %
Lymphs Abs: 1 10*3/uL (ref 0.7–4.0)
MCH: 29.5 pg (ref 26.0–34.0)
MCHC: 31.3 g/dL (ref 30.0–36.0)
MCV: 94.4 fL (ref 80.0–100.0)
Monocytes Absolute: 0.7 10*3/uL (ref 0.1–1.0)
Monocytes Relative: 10 %
Neutro Abs: 4.5 10*3/uL (ref 1.7–7.7)
Neutrophils Relative %: 72 %
Platelets: 372 10*3/uL (ref 150–400)
RBC: 3.42 MIL/uL — ABNORMAL LOW (ref 3.87–5.11)
RDW: 15.3 % (ref 11.5–15.5)
WBC: 6.3 10*3/uL (ref 4.0–10.5)
nRBC: 0 % (ref 0.0–0.2)

## 2019-03-08 LAB — COMPREHENSIVE METABOLIC PANEL
ALT: 27 U/L (ref 0–44)
AST: 25 U/L (ref 15–41)
Albumin: 3.6 g/dL (ref 3.5–5.0)
Alkaline Phosphatase: 64 U/L (ref 38–126)
Anion gap: 11 (ref 5–15)
BUN: 58 mg/dL — ABNORMAL HIGH (ref 8–23)
CO2: 26 mmol/L (ref 22–32)
Calcium: 7.7 mg/dL — ABNORMAL LOW (ref 8.9–10.3)
Chloride: 96 mmol/L — ABNORMAL LOW (ref 98–111)
Creatinine, Ser: 1.9 mg/dL — ABNORMAL HIGH (ref 0.44–1.00)
GFR calc Af Amer: 27 mL/min — ABNORMAL LOW (ref >60)
GFR calc non Af Amer: 23 mL/min — ABNORMAL LOW (ref >60)
Glucose, Bld: 103 mg/dL — ABNORMAL HIGH (ref 70–99)
Potassium: 3.6 mmol/L (ref 3.5–5.1)
Sodium: 133 mmol/L — ABNORMAL LOW (ref 135–145)
Total Bilirubin: 0.6 mg/dL (ref 0.3–1.2)
Total Protein: 6.5 g/dL (ref 6.5–8.1)

## 2019-03-08 LAB — POC SARS CORONAVIRUS 2 AG -  ED: SARS Coronavirus 2 Ag: NEGATIVE

## 2019-03-08 LAB — LIPASE, BLOOD: Lipase: 39 U/L (ref 11–51)

## 2019-03-08 MED ORDER — KCL IN DEXTROSE-NACL 20-5-0.45 MEQ/L-%-% IV SOLN
INTRAVENOUS | Status: DC
  Filled 2019-03-08 (×12): qty 1000

## 2019-03-08 MED ORDER — METOCLOPRAMIDE HCL 10 MG PO TABS
10.0000 mg | ORAL_TABLET | Freq: Once | ORAL | Status: AC
  Administered 2019-03-08: 10 mg via ORAL
  Filled 2019-03-08: qty 1

## 2019-03-08 MED ORDER — OXYCODONE HCL 5 MG PO TABS: 5.0000 mg | ORAL_TABLET | ORAL | Status: DC | PRN

## 2019-03-08 MED ORDER — FENTANYL CITRATE (PF) 100 MCG/2ML IJ SOLN
50.0000 ug | Freq: Once | INTRAMUSCULAR | Status: AC
  Administered 2019-03-08: 16:00:00 50 ug via INTRAVENOUS
  Filled 2019-03-08: qty 2

## 2019-03-08 MED ORDER — ADULT MULTIVITAMIN W/MINERALS CH
1.0000 | ORAL_TABLET | Freq: Every day | ORAL | Status: DC
  Administered 2019-03-09 – 2019-03-12 (×3): 1 via ORAL
  Filled 2019-03-08 (×6): qty 1

## 2019-03-08 MED ORDER — ONDANSETRON HCL 4 MG/2ML IJ SOLN
4.0000 mg | Freq: Once | INTRAMUSCULAR | Status: AC
  Administered 2019-03-08: 4 mg via INTRAVENOUS
  Filled 2019-03-08: qty 2

## 2019-03-08 MED ORDER — SODIUM CHLORIDE 0.9 % IV SOLN: 250.0000 mL | INTRAVENOUS | Status: DC | PRN

## 2019-03-08 MED ORDER — HEPARIN SODIUM (PORCINE) 5000 UNIT/ML IJ SOLN
5000.0000 [IU] | Freq: Three times a day (TID) | INTRAMUSCULAR | Status: DC
  Administered 2019-03-08 – 2019-03-12 (×9): 5000 [IU] via SUBCUTANEOUS
  Filled 2019-03-08 (×9): qty 1

## 2019-03-08 MED ORDER — SODIUM CHLORIDE 0.9% FLUSH
3.0000 mL | Freq: Two times a day (BID) | INTRAVENOUS | Status: DC
  Administered 2019-03-08 – 2019-03-12 (×3): 3 mL via INTRAVENOUS

## 2019-03-08 MED ORDER — FENTANYL CITRATE (PF) 100 MCG/2ML IJ SOLN
50.0000 ug | Freq: Once | INTRAMUSCULAR | Status: AC
  Administered 2019-03-08: 20:00:00 50 ug via INTRAVENOUS
  Filled 2019-03-08: qty 2

## 2019-03-08 MED ORDER — ANASTROZOLE 1 MG PO TABS
1.0000 mg | ORAL_TABLET | Freq: Every day | ORAL | Status: DC
  Administered 2019-03-09 – 2019-03-12 (×3): 1 mg via ORAL
  Filled 2019-03-08 (×6): qty 1

## 2019-03-08 MED ORDER — TRAZODONE HCL 50 MG PO TABS
50.0000 mg | ORAL_TABLET | Freq: Every evening | ORAL | Status: DC | PRN
  Administered 2019-03-10: 50 mg via ORAL
  Filled 2019-03-08: qty 1

## 2019-03-08 MED ORDER — LABETALOL HCL 5 MG/ML IV SOLN: 10.0000 mg | INTRAVENOUS | Status: DC | PRN

## 2019-03-08 MED ORDER — POLYETHYLENE GLYCOL 3350 17 G PO PACK: 17.0000 g | PACK | Freq: Every day | ORAL | Status: DC | PRN

## 2019-03-08 MED ORDER — ONDANSETRON HCL 4 MG PO TABS: 4.0000 mg | ORAL_TABLET | Freq: Four times a day (QID) | ORAL | Status: DC | PRN

## 2019-03-08 MED ORDER — HYDROMORPHONE HCL 1 MG/ML IJ SOLN
0.5000 mg | INTRAMUSCULAR | Status: DC | PRN
  Filled 2019-03-08: qty 0.5

## 2019-03-08 MED ORDER — ALBUTEROL SULFATE (2.5 MG/3ML) 0.083% IN NEBU: 2.5000 mg | INHALATION_SOLUTION | RESPIRATORY_TRACT | Status: DC | PRN

## 2019-03-08 MED ORDER — ONDANSETRON HCL 4 MG/2ML IJ SOLN
4.0000 mg | Freq: Four times a day (QID) | INTRAMUSCULAR | Status: DC | PRN
  Filled 2019-03-08: qty 2

## 2019-03-08 MED ORDER — ACETAMINOPHEN 650 MG RE SUPP: 650.0000 mg | Freq: Four times a day (QID) | RECTAL | Status: DC | PRN

## 2019-03-08 MED ORDER — ACETAMINOPHEN 325 MG PO TABS: 650.0000 mg | ORAL_TABLET | Freq: Four times a day (QID) | ORAL | Status: DC | PRN

## 2019-03-08 MED ORDER — SODIUM CHLORIDE 0.9% FLUSH: 3.0000 mL | INTRAVENOUS | Status: DC | PRN

## 2019-03-08 MED ORDER — FAMOTIDINE IN NACL 20-0.9 MG/50ML-% IV SOLN
20.0000 mg | Freq: Two times a day (BID) | INTRAVENOUS | Status: DC
  Administered 2019-03-08 – 2019-03-11 (×6): 20 mg via INTRAVENOUS
  Filled 2019-03-08 (×6): qty 50

## 2019-03-08 MED ORDER — SODIUM CHLORIDE 0.9 % IV BOLUS
1000.0000 mL | Freq: Once | INTRAVENOUS | Status: AC
  Administered 2019-03-08: 1000 mL via INTRAVENOUS

## 2019-03-08 NOTE — ED Provider Notes (Signed)
[facility] EMERGENCY DEPARTMENT Provider Note   CSN: [id number] Arrival date & time: 03/08/19  1402     History Chief Complaint  Patient presents with  . Abdominal Pain    [name] is a 85 y.o. female.  Who presents emergency department with chief complaint of abdominal pain nausea and vomiting.  She has a past medical history of breast cancer,: Cancer, status post bilateral mastectomy and colon resection.  She is also diabetic with history of COPD and chronic renal insufficiency.  Patient states that she has had nausea and vomiting for the past week.  She states that her vomiting resolved over the past 2 days but she continues to be very nauseous.  She complains of upper abdominal distention and severe pain.  She is also had a cough for the past 3 days.  She denies fever or chills.  She has no known Covid exposures.  She denies urinary symptoms or back pain.  HPI     Past Medical History:  Diagnosis Date  . Breast cancer ([place])   . Breast cancer, right breast ([place]) 05/01/2015  . Cancer (HCC)    BREAST CANCER   . Colon polyps   . COPD (chronic obstructive pulmonary disease) ([place])    DR. Lake Bells  . Diabetes mellitus without complication ([place])    PREDIABETIC  CONTROL W/ DIET  . Hypercholesteremia   . Hypertension   . Lung nodules   . Osteoarthritis   . Osteopenia 09/18/2013  . Osteoporosis   . Personal history of radiation therapy   . PONV (postoperative nausea and vomiting)    ' i get so sick with anesthesia"   . Shortness of breath dyspnea    W/ EXERTION  . Tick bite    3 years ago     Patient Active Problem List   Diagnosis Date Noted  . 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 Lower GI bleed from cecal cancer 12/10/2018  . S/P colon resection 12/08/2018  . Iron deficiency anemia due to chronic blood loss   . 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 CKD (chronic kidney disease),  stage IV ([place]) 12/05/2018  . Acute anemia 12/04/2018  . Ureteral mass 05/11/2018  . Bladder cancer ([place]) 03/20/2018  . 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 Chronic respiratory failure with hypoxia ([place]) 08/13/2016  . Pulmonary nodule 07/19/2016  . Renal bruit 06/29/2016  . Dyspnea 05/04/2016  . COPD (chronic obstructive pulmonary disease) ([place]) 04/26/2016  . Breast cancer, right breast ([place]) 05/01/2015  . Osteoarthritis of both knees 04/21/2015  . Acute bronchitis with COPD ([place]) 03/21/2015  . Abnormal CXR 01/23/2015  . Prediabetes 10/15/2014  . Osteopenia 09/18/2013  . Peripheral neuropathy 08/13/2013  . Hyperglycemia 08/13/2013  . Essential hypertension, benign 09/20/2012  . Carotid artery disease ([place]) 09/20/2012  . Hyperlipidemia 09/20/2012  . Arthritis of right knee 06/08/2007  . KNEE PAIN 06/08/2007    Past Surgical History:  Procedure Laterality Date  . ABDOMINAL HYSTERECTOMY    . APPENDECTOMY    . BARTHOLIN GLAND CYST EXCISION    . BIOPSY  12/06/2018   Procedure: BIOPSY;  Surgeon: Rogene Houston, MD;  [facility];  Service: Endoscopy;;  cecal mass biopsies   . BREAST EXCISIONAL BIOPSY Left    benign  . BREAST LUMPECTOMY WITH RADIOACTIVE SEED LOCALIZATION Right 05/23/2015   Procedure:  RIGHT BREAST LUMPECTOMY WITH RADIOACTIVE SEED LOCALIZATION;  Surgeon: Fanny Skates, MD;  Location: [facility];  Service: General;  Laterality: Right;  . BREAST SURGERY     2000  LEFT BREAST  LUMP REMOVED  . cataract surgeries  Bilateral   . COLON RESECTION N/A 12/08/2018   Procedure: OPEN SEGMENTAL COLON RESECTION;  Surgeon: Fanny Skates, MD;  Location: [facility];  Service: General;  Laterality: N/A;  . colonoscopy    . COLONOSCOPY  03/25/2011   Procedure: COLONOSCOPY;  Surgeon: Rogene Houston, MD;  [facility];  Service: Endoscopy;  Laterality: N/A;  9:30 am  . COLONOSCOPY N/A 12/06/2018    Procedure: COLONOSCOPY;  Surgeon: Rogene Houston, MD;  [facility];  Service: Endoscopy;  Laterality: N/A;  . CYSTOSCOPY W/ URETERAL STENT PLACEMENT Left 02/27/2018   Procedure: CYSTOSCOPY WITH LEFT RETROGRADE PYELOGRAM AND lEFT URETEROSCOPY WITH BIOPSY AND LEFT URETERAL STENT PLACEMENT;  Surgeon: Cleon Gustin, MD;  Location: [facility];  Service: Urology;  Laterality: Left;  . CYSTOSCOPY W/ URETERAL STENT PLACEMENT Right 12/08/2018   Procedure: CYSTOSCOPY WITH RETROGRADE PYELOGRAM/URETERAL STENT PLACEMENT;  Surgeon: Raynelle Bring, MD;  Location[facility];  Service: Urology;  Laterality: Right;  . left inguinal hernia repair    . multiple toe surgeries    . ROBOT ASSITED LAPAROSCOPIC NEPHROURETERECTOMY Left 05/11/2018   Procedure: XI ROBOT ASSITED LAPAROSCOPIC LEFT DISTAL URETERECTOMY, URETERAL  REIMPLANTATION, WITH PSOAS HITCH AND BOARI FLAP. PELVIC LYMPH NODE DISSCETION AND LEFT URETERAL STENT PLACEMENT;  Surgeon: Cleon Gustin, MD;  Location: [facility];  Service: Urology;  Laterality: Left;  . TONSILLECTOMY       OB History   No obstetric history on file.     Family History  Problem Relation Age of Onset  . Colon cancer Father     Social History   Tobacco Use  . Smoking status: Former Smoker    Packs/day: 2.00    Years: 50.00    Pack years: 100.00    Types: Cigarettes    Quit date: 03/15/1988    Years since quitting: 31.0  . Smokeless tobacco: Never Used  Substance Use Topics  . Alcohol use: Yes    Alcohol/week: 0.0 standard drinks    Comment: seldom   . Drug use: No    Home Medications Prior to Admission medications   Medication Sig Start Date End Date Taking? Authorizing Provider  amLODipine (NORVASC) 5 MG tablet TAKE ONE TABLET BY MOUTH ONCE DAILY. Patient taking differently: Take 5 mg by mouth daily.  11/14/18   Kathyrn Drown, MD  anastrozole (ARIMIDEX) 1 MG tablet Take 1 tablet (1 mg total) by mouth daily. Patient taking differently: Take 1 mg by  mouth every evening.  11/04/17   Derek Jack, MD  fluticasone [facility]) 50 MCG/ACT nasal spray Place 2 sprays into both nostrils daily as needed for allergies.     [provider]  furosemide (LASIX) 20 MG tablet Take one tablet qam as needed for swelling 12/28/18   Luking, Scott A, MD  hydrocortisone (ANUSOL-HC) 2.5 % rectal cream APPLY TO AFFECTED AREASTTWICE DAILY AS NEEDED FOR RECTAIL PAIN AND ITCHING. 12/27/18   [provider]  levocetirizine (XYZAL) 5 MG tablet Take 5 mg by mouth daily as needed for allergies.  10/27/18   [provider]  Multiple Vitamins-Minerals (MULTIVITAMIN WITH MINERALS) tablet Take 1 tablet by mouth daily. 12/15/18 12/15/19  Mercy Riding, MD  nortriptyline (PAMELOR) 10 MG capsule TAKE 2 TO  3 CAPSULES BY MOUTH NIGHTLY. Patient taking differently: Take 20-30 mg by mouth at bedtime.  11/14/18   Kathyrn Drown, MD  OXYGEN Inhale 2 L into the lungs. As needed for activities requiring exertion, per patient uses very sparingly    [provider]  potassium chloride (KLOR-CON) 10 MEQ tablet Take one tablet bid on the days you take lasix ( furosemide) Patient not taking: Reported on 01/25/2019 12/28/18   Kathyrn Drown, MD  PROAIR RESPICLICK 287 (90 Base) MCG/ACT AEPB INHALE 1 PUFF INTO THE LUNGS EVERY 4 HOURS AS NEEDED. Patient not taking: No sig reported 03/24/18   Juanito Doom, MD  STIOLTO RESPIMAT 2.5-2.5 MCG/ACT AERS INHALE 2 PUFFS INTO THE LUNGS ONCE DAILY. Patient not taking: No sig reported 12/22/17   Juanito Doom, MD    Allergies    Augmentin [amoxicillin-pot clavulanate], Amoxicillin, Codeine, Morphine, and Neomycin-bacitracin zn-polymyx  Review of Systems   Review of Systems Ten systems reviewed and are negative for acute change, except as noted in the HPI.   Physical Exam Updated Vital Signs BP 140/80   Pulse 93   Temp 97.6 F (36.4 C) (Oral)   Resp (!) 22   Physical Exam Vitals and nursing note  reviewed.  Constitutional:      General: She is not in acute distress.    Appearance: She is well-developed. She is not diaphoretic.  HENT:     Head: Normocephalic and atraumatic.  Eyes:     General: No scleral icterus.    Conjunctiva/sclera: Conjunctivae normal.  Cardiovascular:     Rate and Rhythm: Normal rate and regular rhythm.     Heart sounds: Normal heart sounds. No murmur. No friction rub. No gallop.   Pulmonary:     Effort: Pulmonary effort is normal. No respiratory distress.     Breath sounds: Normal breath sounds.  Abdominal:     General: Bowel sounds are normal. There is distension.     Palpations: Abdomen is soft. There is no mass.     Tenderness: There is abdominal tenderness in the right upper quadrant, epigastric area and left upper quadrant. There is no guarding.     Comments: Well healed midline surgical scar  Musculoskeletal:     Cervical back: Normal range of motion.  Skin:    General: Skin is warm and dry.  Neurological:     Mental Status: She is alert and oriented to person, place, and time.  Psychiatric:        Behavior: Behavior normal.     ED Results / Procedures / Treatments   Labs (all labs ordered are listed, but only abnormal results are displayed) Labs Reviewed - No data to display  EKG None  Radiology No results found.  Procedures Procedures (including critical care time)  Medications Ordered in ED Medications - No data to display  ED Course  I have reviewed the triage vital signs and the nursing notes.  Pertinent labs & imaging results that were available during my care of the patient were reviewed by me and considered in my medical decision making (see chart for details).  Clinical Course as of Mar 07 2114  Thu Mar 08, 2019  1956 I spoke with Dr. Constance Haw who recommends NG tube placement. Conservative management and medical admission. She will round on the patient in the morning.   [AH]    Clinical Course User Index [AH]  Margarita Mail, PA-C   MDM Rules/Calculators/A&P  CC:n/v/abd pain VS: BP 121/83 (BP Location: Left Arm)   Pulse 90   Temp 97.9 F (36.6 C) (Oral)   Resp 15   SpO2 99%  DH:WYSHUOH is gathered by patient  and emr. DDX:The emergent differential diagnosis for vomiting includes, but is not limited to ACS/MI, Boerhaave's, DKA, Intracranial Hemorrhage, Ischemic bowel, Meningitis, Sepsis, Acute radiation syndrome, Acute gastric dilation, Acetaminophen toxicity, Adrenal insufficiency, Appendicitis, Aspirin toxicity, Bowel obstruction/ileus, Carbon monoxide poisoning, Cholecystitis, CNS tumor. Digoxin toxicity, Electrolyte abnormalities, Elevated ICP, Gastric outlet obstruction, Hyperemesis gravidarum, Pancreatitis, Peritonitis, Ruptured viscus, Testicular torsion/ovarian torsion, Theophyline toxicity, Biliary colic, Cannabinoid hyperemesis syndrome, Chemotherapy, Disulfiram effect, Erythromycin, ETOH, Gastritis, Gastroenteritis, Gastroparesis, Hepatitis, Ibuprofen, Ipecac toxicity, Labyrinthitis, Migraine, Motion sickness, Narcotic withdrawal, Thyroid, Pregnancy, Peptic ulcer disease, Renal colic, and UTI Labs: I reviewed the labs which show mild normocytic anemia without leukocytosis.  Lipase within normal limits, Covid test negative.  Mild hyponatremia, mild AKI with elevated creatinine and BUN.  Urine without evidence of infection Imaging: I personally reviewed the images (1 view chest x-ray which shows no acute abnormalities, CT abdomen pelvis) which show(s) small bowel obstruction emanating in the right lower pelvis EKG:n/a MDM: Patient with small bowel obstruction.  She has no active vomiting here.  Pain is improved.  Mild persistent nausea.  Of ordered more antiemetics.  Patient will have NG tube placed.  Discussed the case with Dr. Denton Brick who will admit the patient  Patient disposition:admit Patient condition: serious. The patient appears reasonably stabilized for admission  considering the current resources, flow, and capabilities available in the ED at this time, and I doubt any other [facility] requiring further screening and/or treatment in the ED prior to admission.  Final Clinical Impression(s) / ED Diagnoses Final diagnoses:  None    Rx / DC Orders ED Discharge Orders    None       Margarita Mail, PA-C 03/08/19 2125    Maudie Flakes, MD 03/08/19 2226

## 2019-03-08 NOTE — ED Triage Notes (Signed)
Abdominal pain with nausea and vomiting for a week

## 2019-03-08 NOTE — H&P (Signed)
Patient Demographics:    [name], is a 85 y.o. 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 -Place NG tube for decompression -IV Dilaudid as needed for pain control -IV Zofran as needed -Surgical consult from Dr. Constance Haw requested -N.p.o. for now -Monitor electrolytes closely -IV fluids while n.p.o.  2)H/o Cecal cancer/Stage II (T4BN0) cecal adenocarcinoma: - Right hemicolectomy on 12/08/2018 showed moderately differentiated adenocarcinoma, margins clear, 0/16 lymph nodes positive, LVSI positive.  Preoperative CEA of 12.8.  CEA on 12/26/2018 came down to 10.1. -As per her oncologist Dr. Delton Coombes patient  does not require any adjuvant chemotherapy. - 3)AKI on CKD III- Creatinine is now 1.90, up from a recent baseline of 1.5 (12/26/18)   -Suspect worsening renal function is due to dehydration in the setting of intractable emesis from SBO and poor oral intake -Hydrate iv while NPO -renally adjust medications, avoid nephrotoxic agents / dehydration / hypotension  4)H/o of chronic hypoxic  respiratory failure secondary to #6 above patient uses home O2 intermittently--- continue oxygen via nasal cannula as needed  5)HTN--hold amlodipine,   -may use IV labetalol when necessary  Every 4 hours for systolic blood pressure over 160 mmhg  6)H/o Stage I right breast cancer: - Right breast lumpectomy on 05/23/2015, 0.7 cm IDC, grade 1, associated low-grade DCIS, margins negative, ER positive, PR negative, HER-2 negative, Ki-67 10%. -status Post prior radiation treatment -Anastrozole started in June 2017, she is continuing to tolerate with occasional hot flashes. -Mammogram on 09/19/2018 was BI-RADS Category 2.  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 Alyson Ingles. - Pathology showed PT2PN0 high-grade urothelial carcinoma, 0 out of 1 lymph node involved.  8)COPD--- stable, no acute exacerbation, bronchodilators as needed  With History of - Reviewed by me  Past Medical History:  Diagnosis Date  . Breast cancer ([place])   . Breast cancer, right breast ([place]) 05/01/2015  . Cancer (H[facility])    BREAST CANCER   . Colon polyps   . COPD (chronic obstructive pulmonary disease) ([place])    DR. [place] Bells  . Diabetes mellitus without complication ([place])    PREDIABETIC  CONTROL W/ DIET  . Hypercholesteremia   .  Hypertension   . Lung nodules   . Osteoarthritis   . Osteopenia 09/18/2013  . Osteoporosis   . Personal history of radiation therapy   . PONV (postoperative nausea and vomiting)    ' i get so sick with anesthesia"   . Shortness of breath dyspnea    W/ EXERTION  . Tick bite    3 years ago       Past Surgical History:  Procedure Laterality Date  . ABDOMINAL HYSTERECTOMY    . APPENDECTOMY    . BARTHOLIN GLAND CYST EXCISION    . BIOPSY  12/06/2018   Procedure: BIOPSY;  Surgeon: Rogene Houston, MD;  [facility];  Service: Endoscopy;;  cecal mass biopsies   . BREAST EXCISIONAL  BIOPSY Left    benign  . BREAST LUMPECTOMY WITH RADIOACTIVE SEED LOCALIZATION Right 05/23/2015   Procedure: RIGHT BREAST LUMPECTOMY WITH RADIOACTIVE SEED LOCALIZATION;  Surgeon: Fanny Skates, MD;  Location: [facility];  Service: General;  Laterality: Right;  . BREAST SURGERY     2000  LEFT BREAST  LUMP REMOVED  . cataract surgeries  Bilateral   . COLON RESECTION N/A 12/08/2018   Procedure: OPEN SEGMENTAL COLON RESECTION;  Surgeon: Fanny Skates, MD;  Location: [facility];  Service: General;  Laterality: N/A;  . colonoscopy    . COLONOSCOPY  03/25/2011   Procedure: COLONOSCOPY;  Surgeon: Rogene Houston, MD;  [facility];  Service: Endoscopy;  Laterality: N/A;  9:30 am  . COLONOSCOPY N/A 12/06/2018   Procedure: COLONOSCOPY;  Surgeon: Rogene Houston, MD;  [facility];  Service: Endoscopy;  Laterality: N/A;  . CYSTOSCOPY W/ URETERAL STENT PLACEMENT Left 02/27/2018   Procedure: CYSTOSCOPY WITH LEFT RETROGRADE PYELOGRAM AND lEFT URETEROSCOPY WITH BIOPSY AND LEFT URETERAL STENT PLACEMENT;  Surgeon: Cleon Gustin, MD;  Location: [facility];  Service: Urology;  Laterality: Left;  . CYSTOSCOPY W/ URETERAL STENT PLACEMENT Right 12/08/2018   Procedure: CYSTOSCOPY WITH RETROGRADE PYELOGRAM/URETERAL STENT PLACEMENT;  Surgeon: Raynelle Bring, MD;  Location[facility];  Service: Urology;  Laterality: Right;  . left inguinal hernia repair    . multiple toe surgeries    . ROBOT ASSITED LAPAROSCOPIC NEPHROURETERECTOMY Left 05/11/2018   Procedure: XI ROBOT ASSITED LAPAROSCOPIC LEFT DISTAL URETERECTOMY, URETERAL  REIMPLANTATION, WITH PSOAS HITCH AND BOARI FLAP. PELVIC LYMPH NODE DISSCETION AND LEFT URETERAL STENT PLACEMENT;  Surgeon: Cleon Gustin, MD;  Location[facility];  Service: Urology;  Laterality: Left;  . TONSILLECTOMY       Chief Complaint  Patient presents with  . Abdominal Pain      HPI:    [name]  is a 85 y.o. female  with PMHx significant for breast cancer,  urothelial cancer involving the left ureter, HTH, , COPD/emphysema, h/o DM, chronic hypoxic respiratory failure requiring as needed oxygen, as well as history of cancer of the cecum status post right-sided colectomy on 12/08/2018 presents with persistent abdominal pain nausea vomiting change 03/02/2019--- -last BM 03/06/2019 (loose, but nonbloody) -Emesis was without bile or blood No fever  Or chills  No chest pains palpitations or dizziness  In ED CT abdomen and pelvis with--Distal small bowel obstruction, suspect related to adhesion in the RIGHT upper pelvis.  In ED--WBC 6.3, H&H is 10.1 and 32.3 with platelets of 372 -Lipase is not elevated -LFTs are not elevated, chemistry with sodium of 133 potassium is 3.6 chloride is 96, bicarb is 26 glucose 103 with a BUN of 58 and a  creatinine of 1.90 up from a baseline of 1.5\ -Anion gap is normal at 11    Review of systems:    In addition to the HPI above,   A full Review of  Systems was done, all other systems reviewed are negative except as noted above in HPI , .    Social History:  Reviewed by me    Social History   Tobacco Use  . Smoking status: Former Smoker    Packs/day: 2.00    Years: 50.00    Pack years: 100.00    Types: Cigarettes    Quit date: 03/15/1988    Years since quitting: 31.0  . Smokeless tobacco: Never Used  Substance Use Topics  . Alcohol use: Yes    Alcohol/week: 0.0 standard drinks    Comment: seldom      Family History :  Reviewed by me    Family History  Problem Relation Age of Onset  . Colon cancer Father     Home Medications:   Prior to Admission medications   Medication Sig Start Date End Date Taking? Authorizing Provider  amLODipine (NORVASC) 5 MG tablet TAKE ONE TABLET BY MOUTH ONCE DAILY. Patient taking differently: Take 5 mg by mouth daily.  11/14/18   Kathyrn Drown, MD  anastrozole (ARIMIDEX) 1 MG tablet Take 1 tablet (1 mg total) by mouth daily. Patient taking differently: Take 1 mg by  mouth every evening.  11/04/17   Derek Jack, MD  fluticasone [facility]) 50 MCG/ACT nasal spray Place 2 sprays into both nostrils daily as needed for allergies.     [provider]  furosemide (LASIX) 20 MG tablet Take one tablet qam as needed for swelling 12/28/18   Luking, Scott A, MD  hydrocortisone (ANUSOL-HC) 2.5 % rectal cream APPLY TO AFFECTED AREASTTWICE DAILY AS NEEDED FOR RECTAIL PAIN AND ITCHING. 12/27/18   [provider]  levocetirizine (XYZAL) 5 MG tablet Take 5 mg by mouth daily as needed for allergies.  10/27/18   [provider]  Multiple Vitamins-Minerals (MULTIVITAMIN WITH MINERALS) tablet Take 1 tablet by mouth daily. 12/15/18 12/15/19  Mercy Riding, MD  nortriptyline (PAMELOR) 10 MG capsule TAKE 2 TO 3 CAPSULES BY MOUTH NIGHTLY. Patient taking differently: Take 20-30 mg by mouth at bedtime.  11/14/18   Kathyrn Drown, MD  OXYGEN Inhale 2 L into the lungs. As needed for activities requiring exertion, per patient uses very sparingly    [provider]  potassium chloride (KLOR-CON) 10 MEQ tablet Take one tablet bid on the days you take lasix ( furosemide) Patient not taking: Reported on 01/25/2019 12/28/18   Kathyrn Drown, MD  PROAIR RESPICLICK 053 (90 Base) MCG/ACT AEPB INHALE 1 PUFF INTO THE LUNGS EVERY 4 HOURS AS NEEDED. Patient not taking: No sig reported 03/24/18   Juanito Doom, MD  STIOLTO RESPIMAT 2.5-2.5 MCG/ACT AERS INHALE 2 PUFFS INTO THE LUNGS ONCE DAILY. Patient not taking: No sig reported 12/22/17   Juanito Doom, MD     Allergies:     Allergies  Allergen Reactions  . Augmentin [Amoxicillin-Pot Clavulanate] Nausea And Vomiting    Has patient had a PCN reaction causing immediate rash, facial/tongue/throat swelling, SOB or lightheadedness with hypotension: No Has patient had a PCN reaction causing severe rash involving mucus membranes or skin necrosis: No Has patient had a PCN reaction that required  hospitalization: No Has patient had a PCN reaction occurring within the last 10 years: Unknown If all of the  above answers are "NO", then may proceed with Cephalosporin use.    Marland Kitchen Amoxicillin Diarrhea    Has patient had a PCN reaction causing immediate rash, facial/tongue/throat swelling, SOB or lightheadedness with hypotension: No Has patient had a PCN reaction causing severe rash involving mucus membranes or skin necrosis: No Has patient had a PCN reaction that required hospitalization: No Has patient had a PCN reaction occurring within the last 10 years: Unknown If all of the above answers are "NO", then may proceed with Cephalosporin use.   . Codeine Nausea And Vomiting  . Morphine Nausea And Vomiting  . Neomycin-Bacitracin Zn-Polymyx Itching and Rash    Unable to use Neosporin     Physical Exam:   Vitals  Blood pressure 140/80, pulse 93, temperature 97.6 F (36.4 C), temperature source Oral, resp. rate (!) 22.  Physical Examination: General appearance - alert, well appearing, and in no distress  Mental status - alert, oriented to person, place, and time,  Eyes - sclera anicteric Nose- Bland 2 L/min Neck - supple, no JVD elevation , Chest - clear  to auscultation bilaterally, symmetrical air movement,  Heart - S1 and S2 normal, regular  Abdomen - soft, healed laparotomy scars, +ve abdominal distention, generalized tenderness especially in the lower abdomen worse on the right,  neurological - screening mental status exam normal, neck supple without rigidity, cranial nerves II through XII intact, DTR's normal and symmetric Extremities - no pedal edema noted, intact peripheral pulses  Skin - warm, dry    Data Review:    CBC Recent Labs  Lab 03/08/19 1522  WBC 6.3  HGB 10.1*  HCT 32.3*  PLT 372  MCV 94.4  MCH 29.5  MCHC 31.3  RDW 15.3  LYMPHSABS 1.0  MONOABS 0.7  EOSABS 0.1  BASOSABS 0.0    ------------------------------------------------------------------------------------------------------------------  Chemistries  Recent Labs  Lab 03/08/19 1522  NA 133*  K 3.6  CL 96*  CO2 26  GLUCOSE 103*  BUN 58*  CREATININE 1.90*  CALCIUM 7.7*  AST 25  ALT 27  ALKPHOS 64  BILITOT 0.6   ------------------------------------------------------------------------------------------------------------------ CrCl cannot be calculated (Unknown ideal weight.). ------------------------------------------------------------------------------------------------------------------ No results for input(s): TSH, T4TOTAL, T3FREE, THYROIDAB in the last 72 hours.  Invalid input(s): FREET3   Coagulation profile No results for input(s): INR, PROTIME in the last 168 hours. ------------------------------------------------------------------------------------------------------------------- No results for input(s): DDIMER in the last 72 hours. -------------------------------------------------------------------------------------------------------------------  Cardiac Enzymes No results for input(s): CKMB, TROPONINI, MYOGLOBIN in the last 168 hours.  Invalid input(s): CK ------------------------------------------------------------------------------------------------------------------    Component Value Date/Time   BNP 181.0 (H) 03/28/2018 1634     ---------------------------------------------------------------------------------------------------------------  Urinalysis    Component Value Date/Time   COLORURINE YELLOW 03/08/2019 1457   APPEARANCEUR CLEAR 03/08/2019 1457   LABSPEC 1.016 03/08/2019 1457   PHURINE 5.0 03/08/2019 1457   GLUCOSEU NEGATIVE 03/08/2019 1457   HGBUR NEGATIVE 03/08/2019 1457   BILIRUBINUR NEGATIVE 03/08/2019 1457   KETONESUR 5 (A) 03/08/2019 1457   PROTEINUR NEGATIVE 03/08/2019 1457   NITRITE NEGATIVE 03/08/2019 1457   LEUKOCYTESUR NEGATIVE 03/08/2019 1457     ----------------------------------------------------------------------------------------------------------------   Imaging Results:    CT ABDOMEN PELVIS WO CONTRAST  Result Date: 03/08/2019 CLINICAL DATA:  Abdominal pain, nausea, and vomiting for a week, history breast cancer, COPD, hypertension, diabetes mellitus EXAM: CT ABDOMEN AND PELVIS WITHOUT CONTRAST TECHNIQUE: Multidetector CT imaging of the abdomen and pelvis was performed following the standard protocol without IV contrast. Sagittal and coronal MPR images reconstructed from axial data set.  No oral contrast was administered. COMPARISON:  12/04/2018 FINDINGS: Lower chest: Chronic atelectasis versus scarring at RIGHT lower lobe. Remaining lung bases clear. Hepatobiliary: Gallbladder unremarkable. Multiple low-attenuation foci within liver likely representing small cysts. Additional cystic lesion with dystrophic calcification 2.6 x 2.4 cm image 17, posterior RIGHT lobe liver, unchanged. Pancreas: Normal appearance Spleen: Normal appearance Adrenals/Urinary Tract: Thickening of adrenal glands without discrete mass. Renal vascular calcifications. No definite renal mass or hydronephrosis. Bladder and ureters unremarkable. Stomach/Bowel: Prior ileocolic resection. Stomach unremarkable. Colon decompressed. Dilated proximal and decompressed distal small bowel loops compatible with small bowel obstruction. Transition from dilated to nondilated small bowel occurs in the RIGHT lower quadrant, favor adhesion. Minimal scattered edema within mesentery. No definite bowel wall thickening. Vascular/Lymphatic: Atherosclerotic calcifications aorta and iliac arteries. Aorta normal caliber. No adenopathy. Reproductive: Uterus surgically absent. Nonvisualization of ovaries. Other: No free air or free fluid.  No hernia. Musculoskeletal: Osseous demineralization. IMPRESSION: Distal small bowel obstruction, suspect related to adhesion in the RIGHT upper pelvis. Stable  hepatic cysts including a 2.6 x 2.4 cm cyst with dystrophic calcification at the posterior RIGHT lobe liver. Aortic Atherosclerosis (ICD10-I70.0). Electronically Signed   By: Lavonia Dana M.D.   On: 03/08/2019 18:58   DG Chest Port 1 View  Result Date: 03/08/2019 CLINICAL DATA:  Abdominal pain with nausea and vomiting. EXAM: PORTABLE CHEST 1 VIEW COMPARISON:  Chest x-ray dated 03/28/2018 FINDINGS: The heart size and pulmonary vascularity are normal. Aortic atherosclerosis. The lungs are clear. No significant bone abnormality. IMPRESSION: 1. No active disease in the chest. 2.  Aortic Atherosclerosis (ICD10-I70.0). Electronically Signed   By: Lorriane Shire M.D.   On: 03/08/2019 16:28    Radiological Exams on Admission: CT ABDOMEN PELVIS WO CONTRAST  Result Date: 03/08/2019 CLINICAL DATA:  Abdominal pain, nausea, and vomiting for a week, history breast cancer, COPD, hypertension, diabetes mellitus EXAM: CT ABDOMEN AND PELVIS WITHOUT CONTRAST TECHNIQUE: Multidetector CT imaging of the abdomen and pelvis was performed following the standard protocol without IV contrast. Sagittal and coronal MPR images reconstructed from axial data set. No oral contrast was administered. COMPARISON:  12/04/2018 FINDINGS: Lower chest: Chronic atelectasis versus scarring at RIGHT lower lobe. Remaining lung bases clear. Hepatobiliary: Gallbladder unremarkable. Multiple low-attenuation foci within liver likely representing small cysts. Additional cystic lesion with dystrophic calcification 2.6 x 2.4 cm image 17, posterior RIGHT lobe liver, unchanged. Pancreas: Normal appearance Spleen: Normal appearance Adrenals/Urinary Tract: Thickening of adrenal glands without discrete mass. Renal vascular calcifications. No definite renal mass or hydronephrosis. Bladder and ureters unremarkable. Stomach/Bowel: Prior ileocolic resection. Stomach unremarkable. Colon decompressed. Dilated proximal and decompressed distal small bowel loops  compatible with small bowel obstruction. Transition from dilated to nondilated small bowel occurs in the RIGHT lower quadrant, favor adhesion. Minimal scattered edema within mesentery. No definite bowel wall thickening. Vascular/Lymphatic: Atherosclerotic calcifications aorta and iliac arteries. Aorta normal caliber. No adenopathy. Reproductive: Uterus surgically absent. Nonvisualization of ovaries. Other: No free air or free fluid.  No hernia. Musculoskeletal: Osseous demineralization. IMPRESSION: Distal small bowel obstruction, suspect related to adhesion in the RIGHT upper pelvis. Stable hepatic cysts including a 2.6 x 2.4 cm cyst with dystrophic calcification at the posterior RIGHT lobe liver. Aortic Atherosclerosis (ICD10-I70.0). Electronically Signed   By: Lavonia Dana M.D.   On: 03/08/2019 18:58   DG Chest Port 1 View  Result Date: 03/08/2019 CLINICAL DATA:  Abdominal pain with nausea and vomiting. EXAM: PORTABLE CHEST 1 VIEW COMPARISON:  Chest x-ray dated 03/28/2018 FINDINGS: The heart size and  pulmonary vascularity are normal. Aortic atherosclerosis. The lungs are clear. No significant bone abnormality. IMPRESSION: 1. No active disease in the chest. 2.  Aortic Atherosclerosis (ICD10-I70.0). Electronically Signed   By: Lorriane Shire M.D.   On: 03/08/2019 16:28    DVT Prophylaxis -SCD  AM Labs Ordered, also please review Full Orders  Family Communication: Admission, patients condition and plan of care including tests being ordered have been discussed with the patient who indicate understanding and agree with the plan   Code Status - Full Code  Likely DC to  TBD  Condition   stable*  Roxan Hockey M.D on 03/08/2019 at 8:18 PM Go to [email or web address] -  for contact info  [facility] - Office  [phone]

## 2019-03-09 ENCOUNTER — Inpatient Hospital Stay (HOSPITAL_COMMUNITY): Payer: Medicare Other

## 2019-03-09 DIAGNOSIS — R1084 Generalized abdominal pain: Secondary | ICD-10-CM

## 2019-03-09 LAB — BASIC METABOLIC PANEL
Anion gap: 15 (ref 5–15)
BUN: 46 mg/dL — ABNORMAL HIGH (ref 8–23)
CO2: 22 mmol/L (ref 22–32)
Calcium: 7.2 mg/dL — ABNORMAL LOW (ref 8.9–10.3)
Chloride: 102 mmol/L (ref 98–111)
Creatinine, Ser: 1.54 mg/dL — ABNORMAL HIGH (ref 0.44–1.00)
GFR calc Af Amer: 35 mL/min — ABNORMAL LOW (ref >60)
GFR calc non Af Amer: 30 mL/min — ABNORMAL LOW (ref >60)
Glucose, Bld: 104 mg/dL — ABNORMAL HIGH (ref 70–99)
Potassium: 3.1 mmol/L — ABNORMAL LOW (ref 3.5–5.1)
Sodium: 139 mmol/L (ref 135–145)

## 2019-03-09 LAB — CBC
HCT: 32.7 % — ABNORMAL LOW (ref 36.0–46.0)
Hemoglobin: 9.7 g/dL — ABNORMAL LOW (ref 12.0–15.0)
MCH: 29.3 pg (ref 26.0–34.0)
MCHC: 29.7 g/dL — ABNORMAL LOW (ref 30.0–36.0)
MCV: 98.8 fL (ref 80.0–100.0)
Platelets: 321 10*3/uL (ref 150–400)
RBC: 3.31 MIL/uL — ABNORMAL LOW (ref 3.87–5.11)
RDW: 15.2 % (ref 11.5–15.5)
WBC: 5.4 10*3/uL (ref 4.0–10.5)
nRBC: 0 % (ref 0.0–0.2)

## 2019-03-09 MED ORDER — PHENOL 1.4 % MT LIQD
1.0000 | OROMUCOSAL | Status: DC | PRN
  Filled 2019-03-09: qty 177

## 2019-03-09 MED ORDER — OXYCODONE HCL 5 MG PO TABS: 5.0000 mg | ORAL_TABLET | Freq: Four times a day (QID) | ORAL | Status: DC | PRN

## 2019-03-09 MED ORDER — LORAZEPAM 2 MG/ML IJ SOLN
INTRAMUSCULAR | Status: AC
  Filled 2019-03-09: qty 1

## 2019-03-09 MED ORDER — LORAZEPAM BOLUS VIA INFUSION
0.5000 mg | INTRAVENOUS | Status: DC | PRN
  Filled 2019-03-09: qty 1

## 2019-03-09 NOTE — ED Notes (Signed)
Pt ambulated to restroom with minimal assistance.

## 2019-03-09 NOTE — Consult Note (Signed)
[facility] Consult  Reason for Consult: Small bowel obstruction  Referring Physician:  Dr. Wynetta Emery  Chief Complaint    Abdominal Pain      HPI: [name] is a 85 y.o. 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 Dalbert Batman. She required no chemotherapy treatment. She came in with acute onset of abdominal pain, nausea and vomiting. She had her last BM 12/22 and this was loose in nature. she was evaluated in the ED after her symptoms continued to worsen and she became more distended. A CT was performed that demonstrated SBO with adhesions in the right pelvis and a patent anastomosis.  I requested and NG placed overnight, and the patient was admitted to the hospitalist.  She reports that she has been having flatus but no BM. She says her pain is improved. There is about 500cc in her canister of dark bilious output. She has never had a SBO prior to this obstruction.   Past Medical History:  Diagnosis Date  . Breast cancer ([place])   . Breast cancer, right breast ([place]) 05/01/2015  . Cancer (HCC)    BREAST CANCER   . Colon polyps   . COPD (chronic obstructive pulmonary disease) ([place])    DR. [place] Bells  . Diabetes mellitus without complication ([place])    PREDIABETIC  CONTROL W/ DIET  . Hypercholesteremia   . Hypertension   . Lung nodules   . Osteoarthritis   . Osteopenia 09/18/2013  . Osteoporosis   . Personal history of radiation therapy   . PONV (postoperative nausea and vomiting)    ' i get so sick with anesthesia"   . Shortness of breath dyspnea    W/ EXERTION  . Tick bite    3 years ago     Past Surgical History:  Procedure Laterality Date  . ABDOMINAL HYSTERECTOMY    . APPENDECTOMY    . BARTHOLIN GLAND CYST EXCISION    . BIOPSY  12/06/2018   Procedure: BIOPSY;  Surgeon: Rogene Houston, MD;  [facility];  Service: Endoscopy;;  cecal mass biopsies   . BREAST EXCISIONAL  BIOPSY Left    benign  . BREAST LUMPECTOMY WITH RADIOACTIVE SEED LOCALIZATION Right 05/23/2015   Procedure: RIGHT BREAST LUMPECTOMY WITH RADIOACTIVE SEED LOCALIZATION;  Surgeon: Fanny Skates, MD;  Location: [place];  Service: General;  Laterality: Right;  . BREAST SURGERY     2000  LEFT BREAST  LUMP REMOVED  . cataract surgeries  Bilateral   . COLON RESECTION N/A 12/08/2018   Procedure: OPEN SEGMENTAL COLON RESECTION;  Surgeon: Fanny Skates, MD;  [facility]: [facility];  Service: General;  Laterality: N/A;  . colonoscopy    . COLONOSCOPY  03/25/2011   Procedure: COLONOSCOPY;  Surgeon: Rogene Houston, MD;  [facility];  Service: Endoscopy;  Laterality: N/A;  9:30 am  . COLONOSCOPY N/A 12/06/2018   Procedure: COLONOSCOPY;  Surgeon: Rogene Houston, MD;  [facility];  Service: Endoscopy;  Laterality: N/A;  . CYSTOSCOPY W/ URETERAL STENT PLACEMENT Left 02/27/2018   Procedure: CYSTOSCOPY WITH LEFT RETROGRADE PYELOGRAM AND lEFT URETEROSCOPY WITH BIOPSY AND LEFT URETERAL STENT PLACEMENT;  Surgeon: Cleon Gustin, MD;  Location[facility];  Service: Urology;  Laterality: Left;  . CYSTOSCOPY W/ URETERAL STENT PLACEMENT Right 12/08/2018   Procedure: CYSTOSCOPY WITH RETROGRADE PYELOGRAM/URETERAL STENT PLACEMENT;  Surgeon: Raynelle Bring, MD;  [facility];  Service: Urology;  Laterality: Right;  . left inguinal hernia repair    . multiple toe surgeries    . ROBOT ASSITED LAPAROSCOPIC NEPHROURETERECTOMY Left 05/11/2018   Procedure: XI ROBOT ASSITED LAPAROSCOPIC LEFT DISTAL URETERECTOMY, URETERAL  REIMPLANTATION, WITH PSOAS HITCH AND BOARI FLAP. PELVIC LYMPH NODE DISSCETION AND LEFT URETERAL STENT PLACEMENT;  Surgeon: Cleon Gustin, MD;  Location[facility];  Service: Urology;  Laterality: Left;  . TONSILLECTOMY      Family History  Problem Relation Age of Onset  . Colon cancer Father     Social History   Tobacco Use  . Smoking status: Former Smoker    Packs/day: 2.00     Years: 50.00    Pack years: 100.00    Types: Cigarettes    Quit date: 03/15/1988    Years since quitting: 31.0  . Smokeless tobacco: Never Used  Substance Use Topics  . Alcohol use: Yes    Alcohol/week: 0.0 standard drinks    Comment: seldom   . Drug use: No    Medications:  Current Facility-Administered Medications  Medication Dose Route Frequency Provider Last Rate Last Admin  . 0.9 %  sodium chloride infusion  250 mL Intravenous PRN Emokpae, Courage, MD      . acetaminophen (TYLENOL) tablet 650 mg  650 mg Oral Q6H PRN Emokpae, Courage, MD       Or  . acetaminophen (TYLENOL) suppository 650 mg  650 mg Rectal Q6H PRN Emokpae, Courage, MD      . albuterol (PROVENTIL) (2.5 MG/3ML) 0.083% nebulizer solution 2.5 mg  2.5 mg Nebulization Q2H PRN Emokpae, Courage, MD      . anastrozole (ARIMIDEX) tablet 1 mg  1 mg Oral Daily Emokpae, Courage, MD   1 mg at 03/09/19 1052  . dextrose 5 % and 0.45 % NaCl with KCl 20 mEq/L infusion   Intravenous Continuous Roxan Hockey, MD 75 mL/hr at 03/09/19 0219 New Bag at 03/09/19 0219  . famotidine (PEPCID) IVPB 20 mg premix  20 mg Intravenous Q12H Emokpae, Courage, MD 100 mL/hr at 03/09/19 1052 20 mg at 03/09/19 1052  . heparin injection 5,000 Units  5,000 Units Subcutaneous Q8H Roxan Hockey, MD   5,000 Units at 03/09/19 [phone]  . HYDROmorphone (DILAUDID) injection 0.5 mg  0.5 mg Intravenous Q4H PRN Emokpae, Courage, MD      . labetalol (NORMODYNE) injection 10 mg  10 mg Intravenous Q4H PRN Emokpae, Courage, MD      . multivitamin with minerals tablet 1 tablet  1 tablet Oral Daily Emokpae, Courage, MD   1 tablet at 03/09/19 1053  . ondansetron (ZOFRAN) tablet 4 mg  4 mg Oral Q6H PRN Emokpae, Courage, MD       Or  . ondansetron (ZOFRAN) injection 4 mg  4 mg Intravenous Q6H PRN Emokpae, Courage, MD      . oxyCODONE (Oxy IR/ROXICODONE) immediate release tablet 5 mg  5 mg Oral Q4H PRN Emokpae, Courage, MD      . phenol (CHLORASEPTIC) mouth spray 1  spray  1 spray Mouth/Throat PRN Johnson, Clanford L, MD      . polyethylene glycol (MIRALAX / GLYCOLAX) packet 17 g  17 g Oral Daily PRN Emokpae, Courage, MD      . sodium chloride flush (NS) 0.9 % injection 3 mL  3 mL Intravenous Q12H Emokpae, Courage, MD   3 mL at 03/08/19 2333  . sodium chloride flush (NS) 0.9 % injection 3 mL  3 mL Intravenous PRN Emokpae, Courage,  MD      . traZODone (DESYREL) tablet 50 mg  50 mg Oral QHS PRN Roxan Hockey, MD       Current Outpatient Medications  Medication Sig Dispense Refill Last Dose  . amLODipine (NORVASC) 5 MG tablet TAKE ONE TABLET BY MOUTH ONCE DAILY. (Patient taking differently: Take 5 mg by mouth daily. ) 30 tablet 3   . anastrozole (ARIMIDEX) 1 MG tablet Take 1 tablet (1 mg total) by mouth daily. (Patient taking differently: Take 1 mg by mouth every evening. ) 90 tablet 3   . fluticasone (FLONASE) 50 MCG/ACT nasal spray Place 2 sprays into both nostrils daily as needed for allergies.      . furosemide (LASIX) 20 MG tablet Take one tablet qam as needed for swelling 30 tablet 0   . hydrocortisone (ANUSOL-HC) 2.5 % rectal cream APPLY TO AFFECTED AREASTTWICE DAILY AS NEEDED FOR RECTAIL PAIN AND ITCHING.     Marland Kitchen levocetirizine (XYZAL) 5 MG tablet Take 5 mg by mouth daily as needed for allergies.      . Multiple Vitamins-Minerals (MULTIVITAMIN WITH MINERALS) tablet Take 1 tablet by mouth daily. 120 tablet 2   . nortriptyline (PAMELOR) 10 MG capsule TAKE 2 TO 3 CAPSULES BY MOUTH NIGHTLY. (Patient taking differently: Take 20-30 mg by mouth at bedtime. ) 90 capsule 3   . OXYGEN Inhale 2 L into the lungs. As needed for activities requiring exertion, per patient uses very sparingly     . potassium chloride (KLOR-CON) 10 MEQ tablet Take one tablet bid on the days you take lasix ( furosemide) (Patient not taking: Reported on 01/25/2019) 60 tablet 0   . PROAIR RESPICLICK 071 (90 Base) MCG/ACT AEPB INHALE 1 PUFF INTO THE LUNGS EVERY 4 HOURS AS NEEDED. (Patient  not taking: No sig reported) 1 each 5   . STIOLTO RESPIMAT 2.5-2.5 MCG/ACT AERS INHALE 2 PUFFS INTO THE LUNGS ONCE DAILY. (Patient not taking: No sig reported) 4 g 5     Allergies  Allergen Reactions  . Augmentin [Amoxicillin-Pot Clavulanate] Nausea And Vomiting    Has patient had a PCN reaction causing immediate rash, facial/tongue/throat swelling, SOB or lightheadedness with hypotension: No Has patient had a PCN reaction causing severe rash involving mucus membranes or skin necrosis: No Has patient had a PCN reaction that required hospitalization: No Has patient had a PCN reaction occurring within the last 10 years: Unknown If all of the above answers are "NO", then may proceed with Cephalosporin use.    Marland Kitchen Amoxicillin Diarrhea    Has patient had a PCN reaction causing immediate rash, facial/tongue/throat swelling, SOB or lightheadedness with hypotension: No Has patient had a PCN reaction causing severe rash involving mucus membranes or skin necrosis: No Has patient had a PCN reaction that required hospitalization: No Has patient had a PCN reaction occurring within the last 10 years: Unknown If all of the above answers are "NO", then may proceed with Cephalosporin use.   . Codeine Nausea And Vomiting  . Morphine Nausea And Vomiting  . Neomycin-Bacitracin Zn-Polymyx Itching and Rash    Unable to use Neosporin     ROS:  A comprehensive review of systems was negative except for: Gastrointestinal: positive for abdominal pain, constipation, nausea and vomiting  Blood pressure 140/78, pulse 88, temperature 97.9 F (36.6 C), temperature source Oral, resp. rate 15, SpO2 95 %. Physical Exam Vitals reviewed.  Constitutional:      Appearance: She is well-developed.  HENT:     Head: Normocephalic.  Eyes:     Extraocular Movements: Extraocular movements intact.     Pupils: Pupils are equal, round, and reactive to light.  Cardiovascular:     Rate and Rhythm: Normal rate and regular  rhythm.  Pulmonary:     Effort: Pulmonary effort is normal.  Abdominal:     General: There is distension.     Palpations: Abdomen is soft.     Tenderness: There is abdominal tenderness in the right lower quadrant and suprapubic area.     Hernia: No hernia is present.  Musculoskeletal:     Comments: Moves all extremities  Skin:    General: Skin is warm and dry.  Neurological:     General: No focal deficit present.     Mental Status: She is alert and oriented to person, place, and time.  Psychiatric:        Mood and Affect: Mood normal.        Behavior: Behavior normal.     Results: Results for orders placed or performed during the hospital encounter of 03/08/19 (from the past 48 hour(s))  Urinalysis, Routine w reflex microscopic     Status: Abnormal   Collection Time: 03/08/19  2:57 PM  Result Value Ref Range   Color, Urine YELLOW YELLOW   APPearance CLEAR CLEAR   Specific Gravity, Urine 1.016 1.005 - 1.030   pH 5.0 5.0 - 8.0   Glucose, UA NEGATIVE NEGATIVE mg/dL   Hgb urine dipstick NEGATIVE NEGATIVE   Bilirubin Urine NEGATIVE NEGATIVE   Ketones, ur 5 (A) NEGATIVE mg/dL   Protein, ur NEGATIVE NEGATIVE mg/dL   Nitrite NEGATIVE NEGATIVE   Leukocytes,Ua NEGATIVE NEGATIVE    Comment: Performed at [facility], [address], [place], [place] [address]  Comprehensive metabolic panel     Status: Abnormal   Collection Time: 03/08/19  3:22 PM  Result Value Ref Range   Sodium 133 (L) 135 - 145 mmol/L   Potassium 3.6 3.5 - 5.1 mmol/L   Chloride 96 (L) 98 - 111 mmol/L   CO2 26 22 - 32 mmol/L   Glucose, Bld 103 (H) 70 - 99 mg/dL   BUN 58 (H) 8 - 23 mg/dL   Creatinine, Ser 1.90 (H) 0.44 - 1.00 mg/dL   Calcium 7.7 (L) 8.9 - 10.3 mg/dL   Total Protein 6.5 6.5 - 8.1 g/dL   Albumin 3.6 3.5 - 5.0 g/dL   AST 25 15 - 41 U/L   ALT 27 0 - 44 U/L   Alkaline Phosphatase 64 38 - 126 U/L   Total Bilirubin 0.6 0.3 - 1.2 mg/dL   GFR calc non Af Amer 23 (L) >60 mL/min   GFR calc Af Amer 27  (L) >60 mL/min   Anion gap 11 5 - 15    Comment: Performed at [facility], [address] [place], [place] [address]  CBC with Differential     Status: Abnormal   Collection Time: 03/08/19  3:22 PM  Result Value Ref Range   WBC 6.3 4.0 - 10.5 K/uL   RBC 3.42 (L) 3.87 - 5.11 MIL/uL   Hemoglobin 10.1 (L) 12.0 - 15.0 g/dL   HCT 32.3 (L) 36.0 - 46.0 %   MCV 94.4 80.0 - 100.0 fL   MCH 29.5 26.0 - 34.0 pg   MCHC 31.3 30.0 - 36.0 g/dL   RDW 15.3 11.5 - 15.5 %   Platelets 372 150 - 400 K/uL   nRBC 0.0 0.0 - 0.2 %   Neutrophils Relative %  72 %   Neutro Abs 4.5 1.7 - 7.7 K/uL   Lymphocytes Relative 16 %   Lymphs Abs 1.0 0.7 - 4.0 K/uL   Monocytes Relative 10 %   Monocytes Absolute 0.7 0.1 - 1.0 K/uL   Eosinophils Relative 2 %   Eosinophils Absolute 0.1 0.0 - 0.5 K/uL   Basophils Relative 0 %   Basophils Absolute 0.0 0.0 - 0.1 K/uL   Immature Granulocytes 0 %   Abs Immature Granulocytes 0.02 0.00 - 0.07 K/uL    Comment: Performed at [facility], [address], [place], [place] [address]  Lipase, blood     Status: None   Collection Time: 03/08/19  3:22 PM  Result Value Ref Range   Lipase 39 11 - 51 U/L    Comment: Performed at [facility] [address] [place], [place] [address]  POC SARS Coronavirus 2 Ag-ED - Nasal Swab (BD Veritor Kit)     Status: None   Collection Time: 03/08/19  4:29 PM  Result Value Ref Range   SARS Coronavirus 2 Ag NEGATIVE NEGATIVE    Comment: (NOTE) SARS-CoV-2 antigen NOT DETECTED.  Negative results are presumptive.  Negative results do not preclude SARS-CoV-2 infection and should not be used as the sole basis for treatment or other patient management decisions, including infection  control decisions, particularly in the presence of clinical signs and  symptoms consistent with COVID-19, or in those who have been in contact with the virus.  Negative results must be combined with clinical observations, patient history, and epidemiological information.  The expected result is Negative. Fact Sheet for Patients: PodPark.tn Fact Sheet for Healthcare Providers: GiftContent.is This test is not yet approved or cleared by the [place] FDA and  has been authorized for detection and/or diagnosis of SARS-CoV-2 by FDA under an Emergency Use Authorization (EUA).  This EUA will remain in effect (meaning this test can be used) for the duration of  the COVID-19 de claration under Section 564(b)(1) of the Act, 21 U.S.C. section 360bbb-3(b)(1), unless the authorization is terminated or revoked sooner.   Basic metabolic panel     Status: Abnormal   Collection Time: 03/09/19  7:54 AM  Result Value Ref Range   Sodium 139 135 - 145 mmol/L   Potassium 3.1 (L) 3.5 - 5.1 mmol/L   Chloride 102 98 - 111 mmol/L   CO2 22 22 - 32 mmol/L   Glucose, Bld 104 (H) 70 - 99 mg/dL   BUN 46 (H) 8 - 23 mg/dL   Creatinine, Ser 1.54 (H) 0.44 - 1.00 mg/dL   Calcium 7.2 (L) 8.9 - 10.3 mg/dL   GFR calc non Af Amer 30 (L) >60 mL/min   GFR calc Af Amer 35 (L) >60 mL/min   Anion gap 15 5 - 15    Comment: Performed at [facility], [address] [place], [place] [address]  CBC     Status: Abnormal   Collection Time: 03/09/19  7:54 AM  Result Value Ref Range   WBC 5.4 4.0 - 10.5 K/uL   RBC 3.31 (L) 3.87 - 5.11 MIL/uL   Hemoglobin 9.7 (L) 12.0 - 15.0 g/dL   HCT 32.7 (L) 36.0 - 46.0 %   MCV 98.8 80.0 - 100.0 fL   MCH 29.3 26.0 - 34.0 pg   MCHC 29.7 (L) 30.0 - 36.0 g/dL   RDW 15.2 11.5 - 15.5 %   Platelets 321 150 - 400 K/uL   nRBC 0.0 0.0 - 0.2 %  Comment: Performed at [facility], [address], [place], [place] [address]   Personally reviewed- dilated small bowel with area in the right pelvis where this is decompression, anastomosis appears patent   AM Xray with no dilated small bowel  CT ABDOMEN PELVIS WO CONTRAST  Result Date: 03/08/2019 CLINICAL DATA:  Abdominal pain, nausea, and vomiting for a  week, history breast cancer, COPD, hypertension, diabetes mellitus EXAM: CT ABDOMEN AND PELVIS WITHOUT CONTRAST TECHNIQUE: Multidetector CT imaging of the abdomen and pelvis was performed following the standard protocol without IV contrast. Sagittal and coronal MPR images reconstructed from axial data set. No oral contrast was administered. COMPARISON:  12/04/2018 FINDINGS: Lower chest: Chronic atelectasis versus scarring at RIGHT lower lobe. Remaining lung bases clear. Hepatobiliary: Gallbladder unremarkable. Multiple low-attenuation foci within liver likely representing small cysts. Additional cystic lesion with dystrophic calcification 2.6 x 2.4 cm image 17, posterior RIGHT lobe liver, unchanged. Pancreas: Normal appearance Spleen: Normal appearance Adrenals/Urinary Tract: Thickening of adrenal glands without discrete mass. Renal vascular calcifications. No definite renal mass or hydronephrosis. Bladder and ureters unremarkable. Stomach/Bowel: Prior ileocolic resection. Stomach unremarkable. Colon decompressed. Dilated proximal and decompressed distal small bowel loops compatible with small bowel obstruction. Transition from dilated to nondilated small bowel occurs in the RIGHT lower quadrant, favor adhesion. Minimal scattered edema within mesentery. No definite bowel wall thickening. Vascular/Lymphatic: Atherosclerotic calcifications aorta and iliac arteries. Aorta normal caliber. No adenopathy. Reproductive: Uterus surgically absent. Nonvisualization of ovaries. Other: No free air or free fluid.  No hernia. Musculoskeletal: Osseous demineralization. IMPRESSION: Distal small bowel obstruction, suspect related to adhesion in the RIGHT upper pelvis. Stable hepatic cysts including a 2.6 x 2.4 cm cyst with dystrophic calcification at the posterior RIGHT lobe liver. Aortic Atherosclerosis (ICD10-I70.0). Electronically Signed   By: Lavonia Dana M.D.   On: 03/08/2019 18:58   DG Chest Port 1 View  Result Date:  03/08/2019 CLINICAL DATA:  Abdominal pain with nausea and vomiting. EXAM: PORTABLE CHEST 1 VIEW COMPARISON:  Chest x-ray dated 03/28/2018 FINDINGS: The heart size and pulmonary vascularity are normal. Aortic atherosclerosis. The lungs are clear. No significant bone abnormality. IMPRESSION: 1. No active disease in the chest. 2.  Aortic Atherosclerosis (ICD10-I70.0). Electronically Signed   By: Lorriane Shire M.D.   On: 03/08/2019 16:28   DG ABD ACUTE 2+V W 1V CHEST  Result Date: 03/09/2019 CLINICAL DATA:  Abdominal pain with nausea and vomiting. EXAM: DG ABDOMEN ACUTE W/ 1V CHEST COMPARISON:  Chest x-ray 03/08/2019 FINDINGS: Lungs are adequately inflated without focal airspace consolidation or effusion. Cardiomediastinal silhouette and remainder of the chest is unchanged. Abdominopelvic images demonstrate a nasogastric tube with tip over the stomach in the left upper quadrant. Bowel gas pattern is nonobstructive. There is no free peritoneal air. There are degenerative changes of the spine and hips. IMPRESSION: Negative abdominal radiographs.  No acute cardiopulmonary disease. Nasogastric tube with tip over the stomach in the left upper quadrant. Electronically Signed   By: Marin Olp M.D.   On: 03/09/2019 10:05     Assessment & Plan:  [name] is a 85 y.o. female with SBO that is hopefully resolving. Her AM Xray has an improved bowel gas pattern and she is having flatus. She says she is feeling better.   We discussed SBO and that this is from scar tissue. We discussed the NPO and NG tube to help correct this obstruction, and that we want to avoid surgery if possible. We discussed the option of a SBFT but we  will give her a few days so that we are not pressed to go to surgery now. She seems to be improving.   -PRN for pain -NPO, NG in place to low intermittent wall suction  -Will wait a few days and do a SBFT on Sunday if she has not resolved -Patient to inform RN if she has a BM and we  can hopefully get the NG out   All questions were answered to the satisfaction of the patient.    Virl Cagey 03/09/2019, 1:17 PM

## 2019-03-09 NOTE — Progress Notes (Signed)
PROGRESS NOTE [facility]   [name]  ZOX:[id number]  DOB: Mar 05, 1929  DOA: 03/08/2019 PCP: Kathyrn Drown, MD   Brief Admission Hx: 85 y.o. female with PMHx significant forbreast cancer, urothelial cancer involving the left ureter, HTH, , COPD/emphysema, h/o DM, chronic hypoxic respiratory failure requiring as needed oxygen, as well as history of cancer of the cecum status post right-sided colectomy on 12/08/2018 presents with persistent abdominal pain nausea vomiting change 03/02/2019--- -last BM 03/06/2019 (loose, but nonbloody) -Emesis was without bile or blood No fever  Or chills No chest pains palpitations or dizziness  In ED CT abdomen and pelvis with--distal small bowel obstruction, suspect related to adhesion in the RIGHT upper pelvis.  MDM/Assessment & Plan:   1. SBO - s/p recent proximal right colectomy 12/08/18 for cecal malignancy -patient has NG tube for decompression and seems to be responding well to these treatments.  Surgery is following.  She is having flatus now but no bowel movement.  Continue to monitor closely.  N.p.o.  On IV fluids for hydration. 2. AKI on CKD stage III-creatinine improving with hydration continue to hydrate. 3. History of cecal cancer status post right hemicolectomy and follows with Dr. Delton Coombes. 4. Essential hypertension-she is n.p.o. at this time and is on IV labetalol as needed for elevated blood pressure readings. 5. History of breast cancer status post right breast lumpectomy. 6. Urothelial carcinoma status post left distal ureteral resection 05/11/2018 by Dr. Alyson Ingles. 7. COPD-stable on home bronchodilators.  DVT prophylaxis: SCD Code Status: Full Family Communication:  Disposition Plan: Inpatient   Consultants:  Surgery  Procedures:  N/A  Antimicrobials:  N/A  Subjective: Patient reports that her abdomen feels a lot better today is soft to touch and she is having flatus.  She has not had any bowel  movements.  Objective: Vitals:   03/09/19 0130 03/09/19 0200 03/09/19 0402 03/09/19 1000  BP: (!) 146/63 (!) 121/50 (!) 145/49 140/78  Pulse: 87 91 88   Resp:   15   Temp:      TempSrc:      SpO2: 91% 96% 95%     Intake/Output Summary (Last 24 hours) at 03/09/2019 1324 Last data filed at 03/09/2019 0730 Gross per 24 hour  Intake 1050 ml  Output 600 ml  Net 450 ml   There were no vitals filed for this visit.  REVIEW OF SYSTEMS  As per history otherwise all reviewed and reported negative  Exam:  General exam: Awake, alert, no apparent distress lying on bed. Respiratory system: Clear. No increased work of breathing. Cardiovascular system: S1 & S2 heard. No JVD, murmurs, gallops, clicks or pedal edema. Gastrointestinal system: Abdomen is nondistended, soft and nontender. Normal bowel sounds heard. Central nervous system: Alert and oriented. No focal neurological deficits. Extremities: no CCE.  Data Reviewed: Basic Metabolic Panel: Recent Labs  Lab 03/08/19 1522 03/09/19 0754  NA 133* 139  K 3.6 3.1*  CL 96* 102  CO2 26 22  GLUCOSE 103* 104*  BUN 58* 46*  CREATININE 1.90* 1.54*  CALCIUM 7.7* 7.2*   Liver Function Tests: Recent Labs  Lab 03/08/19 1522  AST 25  ALT 27  ALKPHOS 64  BILITOT 0.6  PROT 6.5  ALBUMIN 3.6   Recent Labs  Lab 03/08/19 1522  LIPASE 39   No results for input(s): AMMONIA in the last 168 hours. CBC: Recent Labs  Lab 03/08/19 1522 03/09/19 0754  WBC 6.3 5.4  NEUTROABS 4.5  --   HGB  10.1* 9.7*  HCT 32.3* 32.7*  MCV 94.4 98.8  PLT 372 321   Cardiac Enzymes: No results for input(s): CKTOTAL, CKMB, CKMBINDEX, TROPONINI in the last 168 hours. CBG (last 3)  No results for input(s): GLUCAP in the last 72 hours. No results found for this or any previous visit (from the past 240 hour(s)).   Studies: CT ABDOMEN PELVIS WO CONTRAST  Result Date: 03/08/2019 CLINICAL DATA:  Abdominal pain, nausea, and vomiting for a week, history  breast cancer, COPD, hypertension, diabetes mellitus EXAM: CT ABDOMEN AND PELVIS WITHOUT CONTRAST TECHNIQUE: Multidetector CT imaging of the abdomen and pelvis was performed following the standard protocol without IV contrast. Sagittal and coronal MPR images reconstructed from axial data set. No oral contrast was administered. COMPARISON:  12/04/2018 FINDINGS: Lower chest: Chronic atelectasis versus scarring at RIGHT lower lobe. Remaining lung bases clear. Hepatobiliary: Gallbladder unremarkable. Multiple low-attenuation foci within liver likely representing small cysts. Additional cystic lesion with dystrophic calcification 2.6 x 2.4 cm image 17, posterior RIGHT lobe liver, unchanged. Pancreas: Normal appearance Spleen: Normal appearance Adrenals/Urinary Tract: Thickening of adrenal glands without discrete mass. Renal vascular calcifications. No definite renal mass or hydronephrosis. Bladder and ureters unremarkable. Stomach/Bowel: Prior ileocolic resection. Stomach unremarkable. Colon decompressed. Dilated proximal and decompressed distal small bowel loops compatible with small bowel obstruction. Transition from dilated to nondilated small bowel occurs in the RIGHT lower quadrant, favor adhesion. Minimal scattered edema within mesentery. No definite bowel wall thickening. Vascular/Lymphatic: Atherosclerotic calcifications aorta and iliac arteries. Aorta normal caliber. No adenopathy. Reproductive: Uterus surgically absent. Nonvisualization of ovaries. Other: No free air or free fluid.  No hernia. Musculoskeletal: Osseous demineralization. IMPRESSION: Distal small bowel obstruction, suspect related to adhesion in the RIGHT upper pelvis. Stable hepatic cysts including a 2.6 x 2.4 cm cyst with dystrophic calcification at the posterior RIGHT lobe liver. Aortic Atherosclerosis (ICD10-I70.0). Electronically Signed   By: Lavonia Dana M.D.   On: 03/08/2019 18:58   DG Chest Port 1 View  Result Date: 03/08/2019 CLINICAL  DATA:  Abdominal pain with nausea and vomiting. EXAM: PORTABLE CHEST 1 VIEW COMPARISON:  Chest x-ray dated 03/28/2018 FINDINGS: The heart size and pulmonary vascularity are normal. Aortic atherosclerosis. The lungs are clear. No significant bone abnormality. IMPRESSION: 1. No active disease in the chest. 2.  Aortic Atherosclerosis (ICD10-I70.0). Electronically Signed   By: Lorriane Shire M.D.   On: 03/08/2019 16:28   DG ABD ACUTE 2+V W 1V CHEST  Result Date: 03/09/2019 CLINICAL DATA:  Abdominal pain with nausea and vomiting. EXAM: DG ABDOMEN ACUTE W/ 1V CHEST COMPARISON:  Chest x-ray 03/08/2019 FINDINGS: Lungs are adequately inflated without focal airspace consolidation or effusion. Cardiomediastinal silhouette and remainder of the chest is unchanged. Abdominopelvic images demonstrate a nasogastric tube with tip over the stomach in the left upper quadrant. Bowel gas pattern is nonobstructive. There is no free peritoneal air. There are degenerative changes of the spine and hips. IMPRESSION: Negative abdominal radiographs.  No acute cardiopulmonary disease. Nasogastric tube with tip over the stomach in the left upper quadrant. Electronically Signed   By: Marin Olp M.D.   On: 03/09/2019 10:05   Scheduled Meds: . anastrozole  1 mg Oral Daily  . heparin  5,000 Units Subcutaneous Q8H  . multivitamin with minerals  1 tablet Oral Daily  . sodium chloride flush  3 mL Intravenous Q12H   Continuous Infusions: . sodium chloride    . dextrose 5 % and 0.45 % NaCl with KCl 20 mEq/L 75 mL/hr at  03/09/19 0219  . 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  1. Check the care team in [facility] and look for a) attending/consulting TRH provider listed and b) the [facility] team listed 2. Log into [email or web address] and use [place] universal password to access. If you do not have the password, please contact the hospital operator. 3. Locate the [facility] provider you are looking for under [facility] and page to a number that you can be directly reached. 4. If you still have difficulty reaching the provider, please page the [facility] (Director on Call) for the Hospitalists listed on amion for assistance.

## 2019-03-09 NOTE — ED Notes (Signed)
Pt has been up in her room with 1 assist without any trouble

## 2019-03-10 ENCOUNTER — Other Ambulatory Visit: Payer: Self-pay

## 2019-03-10 DIAGNOSIS — Z9049 Acquired absence of other specified parts of digestive tract: Secondary | ICD-10-CM

## 2019-03-10 DIAGNOSIS — I10 Essential (primary) hypertension: Secondary | ICD-10-CM

## 2019-03-10 DIAGNOSIS — C679 Malignant neoplasm of bladder, unspecified: Secondary | ICD-10-CM

## 2019-03-10 DIAGNOSIS — C662 Malignant neoplasm of left ureter: Secondary | ICD-10-CM

## 2019-03-10 LAB — COMPREHENSIVE METABOLIC PANEL
ALT: 21 U/L (ref 0–44)
AST: 20 U/L (ref 15–41)
Albumin: 3.1 g/dL — ABNORMAL LOW (ref 3.5–5.0)
Alkaline Phosphatase: 52 U/L (ref 38–126)
Anion gap: 13 (ref 5–15)
BUN: 36 mg/dL — ABNORMAL HIGH (ref 8–23)
CO2: 24 mmol/L (ref 22–32)
Calcium: 7.1 mg/dL — ABNORMAL LOW (ref 8.9–10.3)
Chloride: 105 mmol/L (ref 98–111)
Creatinine, Ser: 1.46 mg/dL — ABNORMAL HIGH (ref 0.44–1.00)
GFR calc Af Amer: 37 mL/min — ABNORMAL LOW (ref >60)
GFR calc non Af Amer: 32 mL/min — ABNORMAL LOW (ref >60)
Glucose, Bld: 93 mg/dL (ref 70–99)
Potassium: 3.3 mmol/L — ABNORMAL LOW (ref 3.5–5.1)
Sodium: 142 mmol/L (ref 135–145)
Total Bilirubin: 0.5 mg/dL (ref 0.3–1.2)
Total Protein: 5.7 g/dL — ABNORMAL LOW (ref 6.5–8.1)

## 2019-03-10 LAB — CBC WITH DIFFERENTIAL/PLATELET
Abs Immature Granulocytes: 0.04 10*3/uL (ref 0.00–0.07)
Basophils Absolute: 0 10*3/uL (ref 0.0–0.1)
Basophils Relative: 0 %
Eosinophils Absolute: 0.1 10*3/uL (ref 0.0–0.5)
Eosinophils Relative: 3 %
HCT: 30.8 % — ABNORMAL LOW (ref 36.0–46.0)
Hemoglobin: 9.3 g/dL — ABNORMAL LOW (ref 12.0–15.0)
Immature Granulocytes: 1 %
Lymphocytes Relative: 21 %
Lymphs Abs: 1 10*3/uL (ref 0.7–4.0)
MCH: 29.7 pg (ref 26.0–34.0)
MCHC: 30.2 g/dL (ref 30.0–36.0)
MCV: 98.4 fL (ref 80.0–100.0)
Monocytes Absolute: 0.5 10*3/uL (ref 0.1–1.0)
Monocytes Relative: 10 %
Neutro Abs: 3.1 10*3/uL (ref 1.7–7.7)
Neutrophils Relative %: 65 %
Platelets: 345 10*3/uL (ref 150–400)
RBC: 3.13 MIL/uL — ABNORMAL LOW (ref 3.87–5.11)
RDW: 15.3 % (ref 11.5–15.5)
WBC: 4.7 10*3/uL (ref 4.0–10.5)
nRBC: 0 % (ref 0.0–0.2)

## 2019-03-10 LAB — MAGNESIUM: Magnesium: 1.9 mg/dL (ref 1.7–2.4)

## 2019-03-10 MED ORDER — LORAZEPAM 2 MG/ML IJ SOLN
1.0000 mg | Freq: Once | INTRAMUSCULAR | Status: AC
  Administered 2019-03-10: 1 mg via INTRAVENOUS
  Filled 2019-03-10: qty 1

## 2019-03-10 MED ORDER — QUETIAPINE FUMARATE 25 MG PO TABS
25.0000 mg | ORAL_TABLET | Freq: Every day | ORAL | Status: DC
  Administered 2019-03-10: 25 mg via ORAL
  Filled 2019-03-10: qty 1

## 2019-03-10 MED ORDER — POTASSIUM CHLORIDE 10 MEQ/100ML IV SOLN
10.0000 meq | INTRAVENOUS | Status: AC
  Administered 2019-03-10 (×4): 10 meq via INTRAVENOUS
  Filled 2019-03-10 (×4): qty 100

## 2019-03-10 NOTE — Progress Notes (Signed)
[facility] Progress Note     Subjective: Patient confused this am. Only oriented to self. She pulled NG out last night. She is on the phone with her cousin, [name], and I spoke with him and updated him. She is telling Nicki Reaper that if she needs surgery she wants to go to [place].  She reported to me yesterday that she had flatus but no BM and no BM is recorded. No vomiting.   Objective: Vital signs in last 24 hours: Temp:  [97.6 F (36.4 C)] 97.6 F (36.4 C) (12/26 0546) Pulse Rate:  [80] 80 (12/26 0546) Resp:  [18] 18 (12/26 0546) BP: (119)/(50) 119/50 (12/26 0546) Weight:  [55 kg] 55 kg (12/26 0021)    Intake/Output from previous day: 12/25 0701 - 12/26 0700 In: 250 [I.V.:200; IV Piggyback:50] Out: 600 [Emesis/NG output:600] Intake/Output this shift: No intake/output data recorded.  General appearance: alert, mild distress and oriented to self only, arguementative  Resp: normal work of breathing GI: soft, nondistended, nontender   Lab Results:  Recent Labs    03/09/19 0754 03/10/19 0625  WBC 5.4 4.7  HGB 9.7* 9.3*  HCT 32.7* 30.8*  PLT 321 345   BMET Recent Labs    03/09/19 0754 03/10/19 0625  NA 139 142  K 3.1* 3.3*  CL 102 105  CO2 22 24  GLUCOSE 104* 93  BUN 46* 36*  CREATININE 1.54* 1.46*  CALCIUM 7.2* 7.1*   PT/INR No results for input(s): LABPROT, INR in the last 72 hours.  Studies/Results: CT ABDOMEN PELVIS WO CONTRAST  Result Date: 03/08/2019 CLINICAL DATA:  Abdominal pain, nausea, and vomiting for a week, history breast cancer, COPD, hypertension, diabetes mellitus EXAM: CT ABDOMEN AND PELVIS WITHOUT CONTRAST TECHNIQUE: Multidetector CT imaging of the abdomen and pelvis was performed following the standard protocol without IV contrast. Sagittal and coronal MPR images reconstructed from axial data set. No oral contrast was administered. COMPARISON:  12/04/2018 FINDINGS: Lower chest: Chronic atelectasis versus scarring at  RIGHT lower lobe. Remaining lung bases clear. Hepatobiliary: Gallbladder unremarkable. Multiple low-attenuation foci within liver likely representing small cysts. Additional cystic lesion with dystrophic calcification 2.6 x 2.4 cm image 17, posterior RIGHT lobe liver, unchanged. Pancreas: Normal appearance Spleen: Normal appearance Adrenals/Urinary Tract: Thickening of adrenal glands without discrete mass. Renal vascular calcifications. No definite renal mass or hydronephrosis. Bladder and ureters unremarkable. Stomach/Bowel: Prior ileocolic resection. Stomach unremarkable. Colon decompressed. Dilated proximal and decompressed distal small bowel loops compatible with small bowel obstruction. Transition from dilated to nondilated small bowel occurs in the RIGHT lower quadrant, favor adhesion. Minimal scattered edema within mesentery. No definite bowel wall thickening. Vascular/Lymphatic: Atherosclerotic calcifications aorta and iliac arteries. Aorta normal caliber. No adenopathy. Reproductive: Uterus surgically absent. Nonvisualization of ovaries. Other: No free air or free fluid.  No hernia. Musculoskeletal: Osseous demineralization. IMPRESSION: Distal small bowel obstruction, suspect related to adhesion in the RIGHT upper pelvis. Stable hepatic cysts including a 2.6 x 2.4 cm cyst with dystrophic calcification at the posterior RIGHT lobe liver. Aortic Atherosclerosis (ICD10-I70.0). Electronically Signed   By: Lavonia Dana M.D.   On: 03/08/2019 18:58   DG Chest Port 1 View  Result Date: 03/08/2019 CLINICAL DATA:  Abdominal pain with nausea and vomiting. EXAM: PORTABLE CHEST 1 VIEW COMPARISON:  Chest x-ray dated 03/28/2018 FINDINGS: The heart size and pulmonary vascularity are normal. Aortic atherosclerosis. The lungs are clear. No significant bone abnormality. IMPRESSION: 1. No active disease in the chest. 2.  Aortic Atherosclerosis (ICD10-I70.0). Electronically Signed  By: Lorriane Shire M.D.   On: 03/08/2019  16:28   DG ABD ACUTE 2+V W 1V CHEST  Result Date: 03/09/2019 CLINICAL DATA:  Abdominal pain with nausea and vomiting. EXAM: DG ABDOMEN ACUTE W/ 1V CHEST COMPARISON:  Chest x-ray 03/08/2019 FINDINGS: Lungs are adequately inflated without focal airspace consolidation or effusion. Cardiomediastinal silhouette and remainder of the chest is unchanged. Abdominopelvic images demonstrate a nasogastric tube with tip over the stomach in the left upper quadrant. Bowel gas pattern is nonobstructive. There is no free peritoneal air. There are degenerative changes of the spine and hips. IMPRESSION: Negative abdominal radiographs.  No acute cardiopulmonary disease. Nasogastric tube with tip over the stomach in the left upper quadrant. Electronically Signed   By: Marin Olp M.D.   On: 03/09/2019 10:05    Anti-infectives: Anti-infectives (From admission, onward)   None      Assessment/Plan: [name] is a 85 yo with confusion. She has a SBO and has been passing some flatus but no BM. She removed her NG last night and hospitalist held on replacing it.  I spoke with her cousin, [name] and updated him.  NPO, would wait for bowel function before doing a diet, if vomits would replace NG Delirious, reorient and keep awake during the day, delirium precautions placed  K replaced Monitor Lytes  No acute need for surgery, will plan for SBFT if not improving in the next few days     LOS: 2 days    Virl Cagey 03/10/2019

## 2019-03-10 NOTE — Progress Notes (Signed)
PROGRESS NOTE [name]   [name]  WJX:[id number]  DOB: [birth year]  DOA: 03/08/2019 PCP: Kathyrn Drown, MD  Brief Admission Hx: 85 y.o. female with PMHx significant forbreast cancer, urothelial cancer involving the left ureter, HTH, , COPD/emphysema, h/o DM, chronic hypoxic respiratory failure requiring as needed oxygen, as well as history of cancer of the cecum status post right-sided colectomy on 12/08/2018 presents with persistent abdominal pain nausea vomiting change 03/02/2019--- -last BM 03/06/2019 (loose, but nonbloody) -Emesis was without bile or blood No fever  Or chills No chest pains palpitations or dizziness  In ED CT abdomen and pelvis with--distal small bowel obstruction, suspect related to adhesion in the RIGHT upper pelvis.  MDM/Assessment & Plan:   1. SBO - s/p recent proximal right colectomy 12/08/18 for cecal malignancy -patient removed her NG tube last night and she is agitated. It was not replaced.  If she vomits then plan to replace.  Surgery is following.  She is having flatus now but no bowel movement.  Continue to monitor closely.  N.p.o.  On IV fluids for hydration.  Appreciate assistance from Dr. Constance Haw.  Plan to do small bowel follow-through if no significant improvements. 2. AKI on CKD stage III-creatinine improving with hydration continue to hydrate. 3. Acute delirium - pt much more agitated today, she has been placed on delirium precautions and ordered low dose seroquel QHS.  4. History of cecal cancer status post right hemicolectomy and follows with Dr. Delton Coombes. 5. Essential hypertension-she is n.p.o. at this time and is on IV labetalol as needed for elevated blood pressure readings. 6. History of breast cancer status post right breast lumpectomy. 7. Urothelial carcinoma status post left distal ureteral resection 05/11/2018 by Dr. Alyson Ingles. 8. COPD-stable on home bronchodilators.  DVT prophylaxis: SCD Code Status: Full Family  Communication: telephone update Disposition Plan: Inpatient  Consultants:  Surgery  Procedures:  N/A  Antimicrobials:  N/A  Subjective: Patient agitated but mostly complaining of nausea, denies BM, denies emesis, she pulled out NG last night  Objective: Vitals:   03/09/19 1000 03/10/19 0021 03/10/19 0546 03/10/19 1231  BP: 140/78  (!) 119/50 (!) 150/82  Pulse:   80 81  Resp:   18 20  Temp:   97.6 F (36.4 C) 98.1 F (36.7 C)  TempSrc:   Oral Oral  SpO2:    100%  Weight:  55 kg    Height:  5\' 3"  (1.6 m)      Intake/Output Summary (Last 24 hours) at 03/10/2019 1423 Last data filed at 03/10/2019 0300 Gross per 24 hour  Intake 250 ml  Output --  Net 250 ml   Filed Weights   03/10/19 0021  Weight: 55 kg   REVIEW OF SYSTEMS  As per history otherwise all reviewed and reported negative, patient is agitated and not willing to cooperate  Exam:  General exam: Patient is very agitated today and confused.   Respiratory system: Clear. No increased work of breathing. Cardiovascular system: S1 & S2 heard. No JVD, murmurs, gallops, clicks or pedal edema. Gastrointestinal system: Abdomen is nondistended, soft and nontender. BS heard. Central nervous system: Alert and oriented to person. No focal neurological deficits. Extremities: no CCE.  Data Reviewed: Basic Metabolic Panel: Recent Labs  Lab 03/08/19 1522 03/09/19 0754 03/10/19 0625  NA 133* 139 142  K 3.6 3.1* 3.3*  CL 96* 102 105  CO2 26 22 24   GLUCOSE 103* 104* 93  BUN 58* 46* 36*  CREATININE 1.90* 1.54*  1.46*  CALCIUM 7.7* 7.2* 7.1*  MG  --   --  1.9   Liver Function Tests: Recent Labs  Lab 03/08/19 1522 03/10/19 0625  AST 25 20  ALT 27 21  ALKPHOS 64 52  BILITOT 0.6 0.5  PROT 6.5 5.7*  ALBUMIN 3.6 3.1*   Recent Labs  Lab 03/08/19 1522  LIPASE 39   No results for input(s): AMMONIA in the last 168 hours. CBC: Recent Labs  Lab 03/08/19 1522 03/09/19 0754 03/10/19 0625  WBC 6.3 5.4  4.7  NEUTROABS 4.5  --  3.1  HGB 10.1* 9.7* 9.3*  HCT 32.3* 32.7* 30.8*  MCV 94.4 98.8 98.4  PLT 372 321 345   Cardiac Enzymes: No results for input(s): CKTOTAL, CKMB, CKMBINDEX, TROPONINI in the last 168 hours. CBG (last 3)  No results for input(s): GLUCAP in the last 72 hours. No results found for this or any previous visit (from the past 240 hour(s)).   Studies: CT ABDOMEN PELVIS WO CONTRAST  Result Date: 03/08/2019 CLINICAL DATA:  Abdominal pain, nausea, and vomiting for a week, history breast cancer, COPD, hypertension, diabetes mellitus EXAM: CT ABDOMEN AND PELVIS WITHOUT CONTRAST TECHNIQUE: Multidetector CT imaging of the abdomen and pelvis was performed following the standard protocol without IV contrast. Sagittal and coronal MPR images reconstructed from axial data set. No oral contrast was administered. COMPARISON:  12/04/2018 FINDINGS: Lower chest: Chronic atelectasis versus scarring at RIGHT lower lobe. Remaining lung bases clear. Hepatobiliary: Gallbladder unremarkable. Multiple low-attenuation foci within liver likely representing small cysts. Additional cystic lesion with dystrophic calcification 2.6 x 2.4 cm image 17, posterior RIGHT lobe liver, unchanged. Pancreas: Normal appearance Spleen: Normal appearance Adrenals/Urinary Tract: Thickening of adrenal glands without discrete mass. Renal vascular calcifications. No definite renal mass or hydronephrosis. Bladder and ureters unremarkable. Stomach/Bowel: Prior ileocolic resection. Stomach unremarkable. Colon decompressed. Dilated proximal and decompressed distal small bowel loops compatible with small bowel obstruction. Transition from dilated to nondilated small bowel occurs in the RIGHT lower quadrant, favor adhesion. Minimal scattered edema within mesentery. No definite bowel wall thickening. Vascular/Lymphatic: Atherosclerotic calcifications aorta and iliac arteries. Aorta normal caliber. No adenopathy. Reproductive: Uterus  surgically absent. Nonvisualization of ovaries. Other: No free air or free fluid.  No hernia. Musculoskeletal: Osseous demineralization. IMPRESSION: Distal small bowel obstruction, suspect related to adhesion in the RIGHT upper pelvis. Stable hepatic cysts including a 2.6 x 2.4 cm cyst with dystrophic calcification at the posterior RIGHT lobe liver. Aortic Atherosclerosis (ICD10-I70.0). Electronically Signed   By: Lavonia Dana M.D.   On: 03/08/2019 18:58   DG Chest Port 1 View  Result Date: 03/08/2019 CLINICAL DATA:  Abdominal pain with nausea and vomiting. EXAM: PORTABLE CHEST 1 VIEW COMPARISON:  Chest x-ray dated 03/28/2018 FINDINGS: The heart size and pulmonary vascularity are normal. Aortic atherosclerosis. The lungs are clear. No significant bone abnormality. IMPRESSION: 1. No active disease in the chest. 2.  Aortic Atherosclerosis (ICD10-I70.0). Electronically Signed   By: Lorriane Shire M.D.   On: 03/08/2019 16:28   DG ABD ACUTE 2+V W 1V CHEST  Result Date: 03/09/2019 CLINICAL DATA:  Abdominal pain with nausea and vomiting. EXAM: DG ABDOMEN ACUTE W/ 1V CHEST COMPARISON:  Chest x-ray 03/08/2019 FINDINGS: Lungs are adequately inflated without focal airspace consolidation or effusion. Cardiomediastinal silhouette and remainder of the chest is unchanged. Abdominopelvic images demonstrate a nasogastric tube with tip over the stomach in the left upper quadrant. Bowel gas pattern is nonobstructive. There is no free peritoneal air. There are degenerative changes  of the spine and hips. IMPRESSION: Negative abdominal radiographs.  No acute cardiopulmonary disease. Nasogastric tube with tip over the stomach in the left upper quadrant. Electronically Signed   By: Marin Olp M.D.   On: 03/09/2019 10:05   Scheduled Meds:  anastrozole  1 mg Oral Daily   heparin  5,000 Units Subcutaneous Q8H   multivitamin with minerals  1 tablet Oral Daily   QUEtiapine  25 mg Oral QHS   sodium chloride flush  3 mL  Intravenous Q12H   Continuous Infusions:  sodium chloride     dextrose 5 % and 0.45 % NaCl with KCl 20 mEq/L 50 mL/hr at 03/09/19 2344   famotidine (PEPCID) IV 20 mg (03/10/19 0905)   potassium chloride 10 mEq (03/10/19 1337)    Principal Problem:   SBO (small bowel obstruction) (HCC) Active Problems:   Essential hypertension, benign   Breast cancer, right breast ([place])   Bladder cancer ([place])   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)   S/P colon resection   Malignant neoplasm of upper-outer quadrant of breast in female, estrogen receptor positive ([place])  Time spent:   Irwin Brakeman, MD [facility] 03/10/2019, 2:23 PM    LOS: 2 days  How to contact the [facility] Attending or Consulting provider [place] or covering provider during after hours [place], for this patient?  1. Check the care team in [facility] and look for a) attending/consulting TRH provider listed and b) the [facility] team listed 2. Log into [email or web address] and use [place]'s universal password to access. If you do not have the password, please contact the hospital operator. 3. Locate the [facility] provider you are looking for under [facility] and page to a number that you can be directly reached. 4. If you still have difficulty reaching the provider, please page the [facility] (Director on Call) for the Hospitalists listed on amion for assistance.

## 2019-03-10 NOTE — Plan of Care (Signed)

## 2019-03-10 NOTE — Progress Notes (Signed)
  Pt removed NG tube. Covering physician made aware. Physician made aware that patient is very confused and trying to leave the hospital. MD wanting to leave NG Tube out for now until the patient becomes more oriented.

## 2019-03-11 DIAGNOSIS — Z170 Estrogen receptor positive status [ER+]: Secondary | ICD-10-CM

## 2019-03-11 DIAGNOSIS — C50419 Malignant neoplasm of upper-outer quadrant of unspecified female breast: Secondary | ICD-10-CM

## 2019-03-11 MED ORDER — LORAZEPAM 1 MG PO TABS: 1.0000 mg | ORAL_TABLET | Freq: Four times a day (QID) | ORAL | Status: DC | PRN

## 2019-03-11 MED ORDER — FAMOTIDINE IN NACL 20-0.9 MG/50ML-% IV SOLN
20.0000 mg | INTRAVENOUS | Status: DC
  Administered 2019-03-12: 20 mg via INTRAVENOUS
  Filled 2019-03-11: qty 50

## 2019-03-11 MED ORDER — QUETIAPINE FUMARATE 25 MG PO TABS
50.0000 mg | ORAL_TABLET | Freq: Every day | ORAL | Status: DC
  Administered 2019-03-11: 50 mg via ORAL
  Filled 2019-03-11: qty 2

## 2019-03-11 MED ORDER — POTASSIUM CHLORIDE CRYS ER 20 MEQ PO TBCR
40.0000 meq | EXTENDED_RELEASE_TABLET | Freq: Once | ORAL | Status: AC
  Administered 2019-03-11: 40 meq via ORAL
  Filled 2019-03-11: qty 2

## 2019-03-11 MED ORDER — HALOPERIDOL LACTATE 5 MG/ML IJ SOLN
1.0000 mg | Freq: Four times a day (QID) | INTRAMUSCULAR | Status: DC | PRN
  Administered 2019-03-11 (×2): 1 mg via INTRAVENOUS
  Filled 2019-03-11 (×2): qty 1

## 2019-03-11 MED ORDER — AMLODIPINE BESYLATE 5 MG PO TABS
5.0000 mg | ORAL_TABLET | Freq: Every day | ORAL | Status: DC
  Administered 2019-03-11 – 2019-03-12 (×2): 5 mg via ORAL
  Filled 2019-03-11 (×2): qty 1

## 2019-03-11 MED ORDER — LORAZEPAM 2 MG/ML IJ SOLN
1.0000 mg | Freq: Once | INTRAMUSCULAR | Status: AC
  Administered 2019-03-11: 1 mg via INTRAMUSCULAR
  Filled 2019-03-11: qty 1

## 2019-03-11 NOTE — Progress Notes (Signed)
Patient bed alarm was going off and got up out of bed and by the time this nurse got in the room she was sitting on the bedside stool and slipped on the stool and feel of off it onto the floor. She slipped off it softly and stated that she was not hurting anywhere. AC, Tim, assisted me in getting her off the floor back into the bed. There are no physical injuries and she is not complaining of anything other than being hungry. MD made aware.

## 2019-03-11 NOTE — Progress Notes (Addendum)
PROGRESS NOTE [facility]   [name]  UUV:[id number]  DOB: 04/04/1928  DOA: 03/08/2019 PCP: Babs Sciara, MD  Brief Admission Hx: 85 y.o. female with PMHx significant forbreast cancer, urothelial cancer involving the left ureter, HTH, , COPD/emphysema, h/o DM, chronic hypoxic respiratory failure requiring as needed oxygen, as well as history of cancer of the cecum status post right-sided colectomy on 12/08/2018 presents with persistent abdominal pain nausea vomiting change 03/02/2019--- -last BM 03/06/2019 (loose, but nonbloody) -Emesis was without bile or blood No fever  Or chills No chest pains palpitations or dizziness  In ED CT abdomen and pelvis with--distal small bowel obstruction, suspect related to adhesion in the RIGHT upper pelvis.  MDM/Assessment & Plan:   1. SBO - s/p recent proximal right colectomy 12/08/18 for cecal malignancy -patient removed her NG tube with her confusion and agitation. It was not replaced. She had a bowel movement, discussed with surgery, will try clears today.  Surgery is following.    Continue to monitor closely.  2. AKI on CKD stage III-creatinine improving with hydration continue to hydrate. 3. Hypokalemia - oral replacement ordered, recheck in AM. Mg within normal limits.  4. Acute delirium - pt remains confused and agitated, she has been placed on delirium precautions and ordered seroquel QHS, haldol, ativan as needed only.  Palliative consult requested given multiple comorbidities, frailty and high mortality risk.  5. History of cecal cancer status post right hemicolectomy and follows with Dr. Ellin Saba. 6. Essential hypertension-resume home amlodipine 5 mg daily. 7. History of breast cancer status post right breast lumpectomy. 8. Urothelial carcinoma status post left distal ureteral resection 05/11/2018 by Dr. Ronne Binning. 9. COPD-stable on home bronchodilators.  DVT prophylaxis: SCD Code Status: Full Family Communication:  telephone update Disposition Plan: Inpatient  Consultants:  Surgery  Procedures:  N/A  Antimicrobials:  N/A  Subjective: Patient mostly confused, but did have a confirmed bowel movement this morning.  Objective: Vitals:   03/10/19 1231 03/10/19 2138 03/10/19 2149 03/11/19 0702  BP: (!) 150/82 (!) 131/48  113/76  Pulse: 81 79  94  Resp: 20 20  20   Temp: 98.1 F (36.7 C) 98.4 F (36.9 C)  98.2 F (36.8 C)  TempSrc: Oral Oral  Oral  SpO2: 100% 99% 95% 100%  Weight:      Height:        Intake/Output Summary (Last 24 hours) at 03/11/2019 1231 Last data filed at 03/10/2019 1515 Gross per 24 hour  Intake 281.01 ml  Output --  Net 281.01 ml   Filed Weights   03/10/19 0021  Weight: 55 kg   REVIEW OF SYSTEMS  As per history otherwise all reviewed and reported negative, patient is agitated and not willing to cooperate  Exam:  General exam: Patient is very agitated today and confused.   Respiratory system: Clear. No increased work of breathing. Cardiovascular system: S1 & S2 heard. No JVD, murmurs, gallops, clicks or pedal edema. Gastrointestinal system: Abdomen is nondistended, soft and nontender. BS heard. Central nervous system: Alert and oriented to person. No focal neurological deficits. Extremities: no CCE.  Data Reviewed: Basic Metabolic Panel: Recent Labs  Lab 03/08/19 1522 03/09/19 0754 03/10/19 0625  NA 133* 139 142  K 3.6 3.1* 3.3*  CL 96* 102 105  CO2 26 22 24   GLUCOSE 103* 104* 93  BUN 58* 46* 36*  CREATININE 1.90* 1.54* 1.46*  CALCIUM 7.7* 7.2* 7.1*  MG  --   --  1.9   Liver  Function Tests: Recent Labs  Lab 03/08/19 1522 03/10/19 0625  AST 25 20  ALT 27 21  ALKPHOS 64 52  BILITOT 0.6 0.5  PROT 6.5 5.7*  ALBUMIN 3.6 3.1*   Recent Labs  Lab 03/08/19 1522  LIPASE 39   No results for input(s): AMMONIA in the last 168 hours. CBC: Recent Labs  Lab 03/08/19 1522 03/09/19 0754 03/10/19 0625  WBC 6.3 5.4 4.7  NEUTROABS 4.5   --  3.1  HGB 10.1* 9.7* 9.3*  HCT 32.3* 32.7* 30.8*  MCV 94.4 98.8 98.4  PLT 372 321 345   Cardiac Enzymes: No results for input(s): CKTOTAL, CKMB, CKMBINDEX, TROPONINI in the last 168 hours. CBG (last 3)  No results for input(s): GLUCAP in the last 72 hours. No results found for this or any previous visit (from the past 240 hour(s)).   Studies: No results found. Scheduled Meds: . anastrozole  1 mg Oral Daily  . heparin  5,000 Units Subcutaneous Q8H  . multivitamin with minerals  1 tablet Oral Daily  . QUEtiapine  50 mg Oral QHS  . sodium chloride flush  3 mL Intravenous Q12H   Continuous Infusions: . sodium chloride    . dextrose 5 % and 0.45 % NaCl with KCl 20 mEq/L 35 mL/hr at 03/11/19 1057  . 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  1. Check the care team in [facility] and look for a) attending/consulting [facility] provider listed and b) the [facility] team listed 2. Log into [email or web address] and use [place]'s universal password to access. If you do not have the password, please contact the hospital operator. 3. Locate the [facility] provider you are looking for under [facility] and page to a number that you can be directly reached. 4. If you still have difficulty reaching the provider, please page the [facility] (Director on Call) for the Hospitalists listed on amion for  assistance.

## 2019-03-11 NOTE — Progress Notes (Signed)
[facility] Progress Note     Subjective: Doing fair. Still confused. Tolerating clears. Had two BMs. Wants to leave the hospital.   Objective: Vital signs in last 24 hours: Temp:  [97.9 F (36.6 C)-98.4 F (36.9 C)] 97.9 F (36.6 C) (12/27 1330) Pulse Rate:  [79-94] 83 (12/27 1330) Resp:  [20] 20 (12/27 1330) BP: (113-147)/(48-76) 147/68 (12/27 1330) SpO2:  [95 %-100 %] 99 % (12/27 1330) Last BM Date: 03/10/19  Intake/Output from previous day: 12/26 0701 - 12/27 0700 In: 281 [IV Piggyback:281] Out: -  Intake/Output this shift: No intake/output data recorded.  General appearance: alert, no distress and confused but cooperative Resp: normal work of breathing GI: soft, nondistended, nontender  Lab Results:  Recent Labs    03/09/19 0754 03/10/19 0625  WBC 5.4 4.7  HGB 9.7* 9.3*  HCT 32.7* 30.8*  PLT 321 345   BMET Recent Labs    03/09/19 0754 03/10/19 0625  NA 139 142  K 3.1* 3.3*  CL 102 105  CO2 22 24  GLUCOSE 104* 93  BUN 46* 36*  CREATININE 1.54* 1.46*  CALCIUM 7.2* 7.1*    Assessment/Plan: [name] is a 85 yo with a resolving SBO. She is tolerating clears. -Adv diet to soft for the PM -Confused but on precautions  -Getting in her own environment would be beneficial for the delirium but unsure what support there is at home    LOS: 3 days    Virl Cagey 03/11/2019

## 2019-03-11 NOTE — Progress Notes (Signed)
Patient was agitated and continued to climb out of bed unless staff member was consistently present in room after fall. Patient was given PRN dose of Haldol and is now resting peacefully with eyes closed. Lunch tray was held for patient when she awakens.

## 2019-03-11 NOTE — Progress Notes (Signed)
Patient agitated, confused and attempting to climb out of bed several times. Patient stated "need to get out of here and get home." Unable to distract, re-direct patient. PRN Haldol given. Patient in bed at this time. Lights turned down. Floor mats in place, bed alarm is on, and bed in lowest position. Call bell within reach. Will continue to monitor.

## 2019-03-11 NOTE — Progress Notes (Signed)
2205 pt pleasantly confused, but cooperative. Ambulated to bathroom and noted to have a BM but unable to assess d/t pt flushing toilet. She states that she had "a large one." and states that's what we've all been waiting on. Assisted to bed and pt appeared to go to sleep.  2300 pt attempting to get out of bed to get her shoes. Stated "I don't think I'm going to stay here tonight, I'm tired of laying in this bed." Attempted to re-orient, re-direct pt with no success. Notified mid-level and Ativan 1 mg push was given. Pt appeared drowsy but still insisting she was not going to lay in bed and wanted to sit up. Assisted into bed shortly with nurse tech and pt began kicking and punching at [place]. IV noted to be leaking through gauze and appeared to be infiltrated. Stop fluids and applied mits to pt. New order given for an additional 1 mg dose of Ativan IM. Pt sleep within the hr of administering  Bed in low position, bed alarm on, frequently making rounds.

## 2019-03-12 ENCOUNTER — Telehealth: Payer: Self-pay | Admitting: Family Medicine

## 2019-03-12 ENCOUNTER — Encounter (HOSPITAL_COMMUNITY): Payer: Self-pay | Admitting: Family Medicine

## 2019-03-12 LAB — CBC WITH DIFFERENTIAL/PLATELET
Abs Immature Granulocytes: 0.15 10*3/uL — ABNORMAL HIGH (ref 0.00–0.07)
Basophils Absolute: 0 10*3/uL (ref 0.0–0.1)
Basophils Relative: 1 %
Eosinophils Absolute: 0.1 10*3/uL (ref 0.0–0.5)
Eosinophils Relative: 2 %
HCT: 34.3 % — ABNORMAL LOW (ref 36.0–46.0)
Hemoglobin: 10.3 g/dL — ABNORMAL LOW (ref 12.0–15.0)
Immature Granulocytes: 2 %
Lymphocytes Relative: 20 %
Lymphs Abs: 1.7 10*3/uL (ref 0.7–4.0)
MCH: 29.1 pg (ref 26.0–34.0)
MCHC: 30 g/dL (ref 30.0–36.0)
MCV: 96.9 fL (ref 80.0–100.0)
Monocytes Absolute: 0.7 10*3/uL (ref 0.1–1.0)
Monocytes Relative: 8 %
Neutro Abs: 5.9 10*3/uL (ref 1.7–7.7)
Neutrophils Relative %: 67 %
Platelets: 371 10*3/uL (ref 150–400)
RBC: 3.54 MIL/uL — ABNORMAL LOW (ref 3.87–5.11)
RDW: 15.6 % — ABNORMAL HIGH (ref 11.5–15.5)
WBC: 8.6 10*3/uL (ref 4.0–10.5)
nRBC: 0 % (ref 0.0–0.2)

## 2019-03-12 LAB — BASIC METABOLIC PANEL
Anion gap: 14 (ref 5–15)
BUN: 22 mg/dL (ref 8–23)
CO2: 22 mmol/L (ref 22–32)
Calcium: 8.2 mg/dL — ABNORMAL LOW (ref 8.9–10.3)
Chloride: 108 mmol/L (ref 98–111)
Creatinine, Ser: 1.59 mg/dL — ABNORMAL HIGH (ref 0.44–1.00)
GFR calc Af Amer: 33 mL/min — ABNORMAL LOW (ref >60)
GFR calc non Af Amer: 29 mL/min — ABNORMAL LOW (ref >60)
Glucose, Bld: 101 mg/dL — ABNORMAL HIGH (ref 70–99)
Potassium: 5 mmol/L (ref 3.5–5.1)
Sodium: 144 mmol/L (ref 135–145)

## 2019-03-12 MED ORDER — FUROSEMIDE 20 MG PO TABS: 20.0000 mg | ORAL_TABLET | Freq: Every day | ORAL | Status: DC | PRN

## 2019-03-12 NOTE — Telephone Encounter (Signed)
Nurse's-patient recently discharged from the hospital. Please call patient, let them know that we are aware that they were discharged from the hospital. Please schedule them to follow-up with [place] within the next 7 days. Advised the patient to bring all of their medications with him to the visit. Please inquire if they are having any acute issues currently and documented accordingly.  Nurses Call the patient on Tuesday Review her medications with her and see if she has any questions and please document accordingly Please go ahead and set her up for Monday of next week for a in person visit Tuesday at the latest If the patient does not feel she can come to the office we can do a virtual visit if she would rather do so

## 2019-03-12 NOTE — Progress Notes (Signed)
[facility] Progress Note     Subjective: Patient up to the bathroom when I came into room.  Had Bms yesterday. Says this was only urine.  Tolerating soft diet.   Objective: Vital signs in last 24 hours: Temp:  [97.9 F (36.6 C)-98.4 F (36.9 C)] 98.4 F (36.9 C) (12/28 0511) Pulse Rate:  [81-120] 120 (12/28 0840) Resp:  [17-20] 17 (12/28 0840) BP: (142-158)/(48-73) 142/73 (12/28 0840) SpO2:  [94 %-99 %] 99 % (12/28 0840) Last BM Date: 03/11/19  Intake/Output from previous day: 12/27 0701 - 12/28 0700 In: 430 [P.O.:340; I.V.:90] Out: -  Intake/Output this shift: No intake/output data recorded.  General appearance: alert, cooperative, no distress and confused Resp: normal work of breathing GI: soft, nondistended and nontender  Lab Results:  Recent Labs    03/10/19 0625 03/12/19 0548  WBC 4.7 8.6  HGB 9.3* 10.3*  HCT 30.8* 34.3*  PLT 345 371   BMET Recent Labs    03/10/19 0625 03/12/19 0548  NA 142 144  K 3.3* 5.0  CL 105 108  CO2 24 22  GLUCOSE 93 101*  BUN 36* 22  CREATININE 1.46* 1.59*  CALCIUM 7.1* 8.2*    Assessment/Plan: [name] is a 85 yo with a resolving SBO. Having BMs and tolerating diet. Remains delirious. -Continue diet  -Follow up with PCP   LOS: 4 days    Virl Cagey 03/12/2019

## 2019-03-12 NOTE — Discharge Summary (Signed)
Physician Discharge Summary  [name] JGO:[id number] DOB: Dec 20, 1928 DOA: 03/08/2019  PCP: Kathyrn Drown, MD  Admit date: 03/08/2019 Discharge date: 03/12/2019  Admitted From:  HOME  Disposition:  HOME with [facility] PT   Recommendations for Outpatient Follow-up:  1. Follow up with PCP in 1 weeks 2. Outpatient palliative care discussions recommended 3. Please follow up BMP/CBC in 1-2 weeks   Home Health:  PT, RN, SW  Discharge Condition: STABLE   CODE STATUS: FULL    Brief Hospitalization Summary: Please see all hospital notes, images, labs for full details of the hospitalization. 85 y.o.femalewithPMHxsignificant forbreast cancer, urothelial cancer involving the left ureter,HTH, , COPD/emphysema,h/o DM,chronic hypoxic respiratory failure requiring as needed oxygen, as well ashistory of cancer of the cecum status post right-sided colectomy on 12/08/2018 presents with persistent abdominal pain nausea vomiting change12/18/2020--- -lastBM 03/06/2019 (loose,but nonbloody) -Emesis was without bile or blood No fever Or chills No chest pains palpitations or dizziness  In EDCT abdomen and pelvis with--distal small bowel obstruction, suspect related to adhesion in the RIGHT upper pelvis.  MDM/Assessment & Plan:   1. SBO - RESOLVED NOW.  Pt has had multiple BMs and tolerated soft diet.  Will have her continue soft diet for now. Follow up with PCP and oncology team.  s/p recent proximal right colectomy 12/08/18 for cecal malignancy.  2. AKI on CKD stage III-creatinine improved with hydration. 3. Hypokalemia - oral replacement given and was repleted.  Follow up with PCP. 4. Acute delirium - Resolved now.  Pt is much less confused.   Outpatient palliative consult requested given multiple comorbidities, frailty and high mortality risk.  5. History of cecal cancer status post right hemicolectomy and follows with Dr. Delton Coombes. 6. Essential hypertension-resume home amlodipine  5 mg daily. 7. History of breast cancer status post right breast lumpectomy. 8. Urothelial carcinoma status post left distal ureteral resection 05/11/2018 by Dr. Alyson Ingles. 9. COPD-stable on home bronchodilators.  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, estrogen receptor positive ([place])   Discharge Instructions:  Allergies as of 03/12/2019      Reactions   Augmentin [amoxicillin-pot Clavulanate] Nausea And Vomiting   Has patient had a PCN reaction causing immediate rash, facial/tongue/throat swelling, SOB or lightheadedness with hypotension: No Has patient had a PCN reaction causing severe rash involving mucus membranes or skin necrosis: No Has patient had a PCN reaction that required hospitalization: No Has patient had a PCN reaction occurring within the last 10 years: Unknown If all of the above answers are "NO", then may proceed with Cephalosporin use.   Amoxicillin Diarrhea   Has patient had a PCN reaction causing immediate rash, facial/tongue/throat swelling, SOB or lightheadedness with hypotension: No Has patient had a PCN reaction causing severe rash involving mucus membranes or skin necrosis: No Has patient had a PCN reaction that required hospitalization: No Has patient had a PCN reaction occurring within the last 10 years: Unknown If all of the above answers are "NO", then may proceed with Cephalosporin use.  Codeine Nausea And Vomiting   Morphine  Nausea And Vomiting   Neomycin-bacitracin Zn-polymyx Itching, Rash   Unable to use Neosporin      Medication List    TAKE these medications   amLODipine 5 MG tablet Commonly known as: NORVASC TAKE ONE TABLET BY MOUTH ONCE DAILY.   anastrozole 1 MG tablet Commonly known as: ARIMIDEX Take 1 tablet (1 mg total) by mouth daily. What changed: when to take this   fluticasone 50 MCG/ACT nasal spray Commonly known as: FLONASE Place 2 sprays into both nostrils daily as needed for allergies.   furosemide 20 MG tablet Commonly known as: LASIX Take 1 tablet (20 mg total) by mouth daily as needed for fluid.   hydrocortisone 2.5 % rectal cream Commonly known as: ANUSOL-HC Place 1 application rectally 2 (two) times daily as needed for hemorrhoids or anal itching.   levocetirizine 5 MG tablet Commonly known as: XYZAL Take 5 mg by mouth daily as needed for allergies.   multivitamin with minerals tablet Take 1 tablet by mouth daily.   nortriptyline 10 MG capsule Commonly known as: PAMELOR TAKE 2 TO 3 CAPSULES BY MOUTH NIGHTLY. What changed: See the new instructions.   OXYGEN Inhale 2 L into the lungs. As needed for activities requiring exertion, per patient uses very sparingly   potassium chloride 10 MEQ tablet Commonly known as: KLOR-CON Take one tablet bid on the days you take lasix ( furosemide) What changed:   how much to take  how to take this  when to take this   ProAir RespiClick 427 (90 Base) MCG/ACT Aepb Generic drug: Albuterol Sulfate INHALE 1 PUFF INTO THE LUNGS EVERY 4 HOURS AS NEEDED. What changed: See the new instructions.      Follow-up Information    Luking, Elayne Snare, MD. Schedule an appointment as soon as possible for a visit in 1 week(s).   Specialty: Family Medicine Contact information: [place] [address] [phone]          Allergies  Allergen Reactions  . Augmentin [Amoxicillin-Pot Clavulanate] Nausea And  Vomiting    Has patient had a PCN reaction causing immediate rash, facial/tongue/throat swelling, SOB or lightheadedness with hypotension: No Has patient had a PCN reaction causing severe rash involving mucus membranes or skin necrosis: No Has patient had a PCN reaction that required hospitalization: No Has patient had a PCN reaction occurring within the last 10 years: Unknown If all of the above answers are "NO", then may proceed with Cephalosporin use.    Marland Kitchen Amoxicillin Diarrhea    Has patient had a PCN reaction causing immediate rash, facial/tongue/throat swelling, SOB or lightheadedness with hypotension: No Has patient had a PCN reaction causing severe rash involving mucus membranes or skin necrosis: No Has patient had a PCN reaction that required hospitalization: No Has patient had a PCN reaction occurring within the last 10 years: Unknown If all of the above answers are "NO", then may proceed with Cephalosporin use.   . Codeine Nausea And Vomiting  . Morphine Nausea And Vomiting  . Neomycin-Bacitracin Zn-Polymyx Itching and Rash    Unable to use Neosporin   Allergies as of 03/12/2019      Reactions   Augmentin [amoxicillin-pot Clavulanate] Nausea And Vomiting   Has patient had a PCN reaction causing immediate rash, facial/tongue/throat swelling, SOB or lightheadedness with hypotension: No Has patient had a PCN reaction causing severe rash involving mucus membranes or skin necrosis: No Has patient had a PCN  reaction that required hospitalization: No Has patient had a PCN reaction occurring within the last 10 years: Unknown If all of the above answers are "NO", then may proceed with Cephalosporin use.   Amoxicillin Diarrhea   Has patient had a PCN reaction causing immediate rash, facial/tongue/throat swelling, SOB or lightheadedness with hypotension: No Has patient had a PCN reaction causing severe rash involving mucus membranes or skin necrosis: No Has patient had a PCN reaction  that required hospitalization: No Has patient had a PCN reaction occurring within the last 10 years: Unknown If all of the above answers are "NO", then may proceed with Cephalosporin use.   Codeine Nausea And Vomiting   Morphine Nausea And Vomiting   Neomycin-bacitracin Zn-polymyx Itching, Rash   Unable to use Neosporin      Medication List    TAKE these medications   amLODipine 5 MG tablet Commonly known as: NORVASC TAKE ONE TABLET BY MOUTH ONCE DAILY.   anastrozole 1 MG tablet Commonly known as: ARIMIDEX Take 1 tablet (1 mg total) by mouth daily. What changed: when to take this   fluticasone 50 MCG/ACT nasal spray Commonly known as: FLONASE Place 2 sprays into both nostrils daily as needed for allergies.   furosemide 20 MG tablet Commonly known as: LASIX Take 1 tablet (20 mg total) by mouth daily as needed for fluid.   hydrocortisone 2.5 % rectal cream Commonly known as: ANUSOL-HC Place 1 application rectally 2 (two) times daily as needed for hemorrhoids or anal itching.   levocetirizine 5 MG tablet Commonly known as: XYZAL Take 5 mg by mouth daily as needed for allergies.   multivitamin with minerals tablet Take 1 tablet by mouth daily.   nortriptyline 10 MG capsule Commonly known as: PAMELOR TAKE 2 TO 3 CAPSULES BY MOUTH NIGHTLY. What changed: See the new instructions.   OXYGEN Inhale 2 L into the lungs. As needed for activities requiring exertion, per patient uses very sparingly   potassium chloride 10 MEQ tablet Commonly known as: KLOR-CON Take one tablet bid on the days you take lasix ( furosemide) What changed:   how much to take  how to take this  when to take this   ProAir RespiClick 818 (90 Base) MCG/ACT Aepb Generic drug: Albuterol Sulfate INHALE 1 PUFF INTO THE LUNGS EVERY 4 HOURS AS NEEDED. What changed: See the new instructions.       Procedures/Studies: CT ABDOMEN PELVIS WO CONTRAST  Result Date: 03/08/2019 CLINICAL DATA:   Abdominal pain, nausea, and vomiting for a week, history breast cancer, COPD, hypertension, diabetes mellitus EXAM: CT ABDOMEN AND PELVIS WITHOUT CONTRAST TECHNIQUE: Multidetector CT imaging of the abdomen and pelvis was performed following the standard protocol without IV contrast. Sagittal and coronal MPR images reconstructed from axial data set. No oral contrast was administered. COMPARISON:  12/04/2018 FINDINGS: Lower chest: Chronic atelectasis versus scarring at RIGHT lower lobe. Remaining lung bases clear. Hepatobiliary: Gallbladder unremarkable. Multiple low-attenuation foci within liver likely representing small cysts. Additional cystic lesion with dystrophic calcification 2.6 x 2.4 cm image 17, posterior RIGHT lobe liver, unchanged. Pancreas: Normal appearance Spleen: Normal appearance Adrenals/Urinary Tract: Thickening of adrenal glands without discrete mass. Renal vascular calcifications. No definite renal mass or hydronephrosis. Bladder and ureters unremarkable. Stomach/Bowel: Prior ileocolic resection. Stomach unremarkable. Colon decompressed. Dilated proximal and decompressed distal small bowel loops compatible with small bowel obstruction. Transition from dilated to nondilated small bowel occurs in the RIGHT lower quadrant, favor adhesion. Minimal scattered edema within mesentery. No definite bowel  wall thickening. Vascular/Lymphatic: Atherosclerotic calcifications aorta and iliac arteries. Aorta normal caliber. No adenopathy. Reproductive: Uterus surgically absent. Nonvisualization of ovaries. Other: No free air or free fluid.  No hernia. Musculoskeletal: Osseous demineralization. IMPRESSION: Distal small bowel obstruction, suspect related to adhesion in the RIGHT upper pelvis. Stable hepatic cysts including a 2.6 x 2.4 cm cyst with dystrophic calcification at the posterior RIGHT lobe liver. Aortic Atherosclerosis (ICD10-I70.0). Electronically Signed   By: Lavonia Dana M.D.   On: 03/08/2019 18:58    DG Chest Port 1 View  Result Date: 03/08/2019 CLINICAL DATA:  Abdominal pain with nausea and vomiting. EXAM: PORTABLE CHEST 1 VIEW COMPARISON:  Chest x-ray dated 03/28/2018 FINDINGS: The heart size and pulmonary vascularity are normal. Aortic atherosclerosis. The lungs are clear. No significant bone abnormality. IMPRESSION: 1. No active disease in the chest. 2.  Aortic Atherosclerosis (ICD10-I70.0). Electronically Signed   By: Lorriane Shire M.D.   On: 03/08/2019 16:28   DG ABD ACUTE 2+V W 1V CHEST  Result Date: 03/09/2019 CLINICAL DATA:  Abdominal pain with nausea and vomiting. EXAM: DG ABDOMEN ACUTE W/ 1V CHEST COMPARISON:  Chest x-ray 03/08/2019 FINDINGS: Lungs are adequately inflated without focal airspace consolidation or effusion. Cardiomediastinal silhouette and remainder of the chest is unchanged. Abdominopelvic images demonstrate a nasogastric tube with tip over the stomach in the left upper quadrant. Bowel gas pattern is nonobstructive. There is no free peritoneal air. There are degenerative changes of the spine and hips. IMPRESSION: Negative abdominal radiographs.  No acute cardiopulmonary disease. Nasogastric tube with tip over the stomach in the left upper quadrant. Electronically Signed   By: Marin Olp M.D.   On: 03/09/2019 10:05      Subjective: Pt had multiple bowel movements and tolerating soft diet.  No abdominal pain.  Ambulated with PT and HHPT was recommended.   Discharge Exam: Vitals:   03/12/19 0511 03/12/19 0840  BP: (!) 142/73 (!) 142/73  Pulse: 99 (!) 120  Resp: 19 17  Temp: 98.4 F (36.9 C)   SpO2: 99% 99%   Vitals:   03/11/19 1948 03/11/19 2108 03/12/19 0511 03/12/19 0840  BP:  (!) 158/48 (!) 142/73 (!) 142/73  Pulse:  81 99 (!) 120  Resp:  18 19 17   Temp:  98.1 F (36.7 C) 98.4 F (36.9 C)   TempSrc:   Oral   SpO2: 94% 99% 99% 99%  Weight:      Height:       General exam: Patient is seen ambulating with PT today.    Respiratory system:  Clear. No increased work of breathing. Cardiovascular system: S1 & S2 heard. No JVD, murmurs, gallops, clicks or pedal edema. Gastrointestinal system: Abdomen is nondistended, soft and nontender. BS heard. Central nervous system: Alert and oriented to person. No focal neurological deficits. Extremities: no CCE.   The results of significant diagnostics from this hospitalization (including imaging, microbiology, ancillary and laboratory) are listed below for reference.     Microbiology: No results found for this or any previous visit (from the past 240 hour(s)).   Labs: BNP (last 3 results) Recent Labs    03/28/18 1634  BNP 998.3*   Basic Metabolic Panel: Recent Labs  Lab 03/08/19 1522 03/09/19 0754 03/10/19 0625 03/12/19 0548  NA 133* 139 142 144  K 3.6 3.1* 3.3* 5.0  CL 96* 102 105 108  CO2 26 22 24 22   GLUCOSE 103* 104* 93 101*  BUN 58* 46* 36* 22  CREATININE 1.90* 1.54* 1.46* 1.59*  CALCIUM 7.7* 7.2* 7.1* 8.2*  MG  --   --  1.9  --    Liver Function Tests: Recent Labs  Lab 03/08/19 1522 03/10/19 0625  AST 25 20  ALT 27 21  ALKPHOS 64 52  BILITOT 0.6 0.5  PROT 6.5 5.7*  ALBUMIN 3.6 3.1*   Recent Labs  Lab 03/08/19 1522  LIPASE 39   No results for input(s): AMMONIA in the last 168 hours. CBC: Recent Labs  Lab 03/08/19 1522 03/09/19 0754 03/10/19 0625 03/12/19 0548  WBC 6.3 5.4 4.7 8.6  NEUTROABS 4.5  --  3.1 5.9  HGB 10.1* 9.7* 9.3* 10.3*  HCT 32.3* 32.7* 30.8* 34.3*  MCV 94.4 98.8 98.4 96.9  PLT 372 321 345 371   Cardiac Enzymes: No results for input(s): CKTOTAL, CKMB, CKMBINDEX, TROPONINI in the last 168 hours. BNP: Invalid input(s): POCBNP CBG: No results for input(s): GLUCAP in the last 168 hours. D-Dimer No results for input(s): DDIMER in the last 72 hours. Hgb A1c No results for input(s): HGBA1C in the last 72 hours. Lipid Profile No results for input(s): CHOL, HDL, LDLCALC, TRIG, CHOLHDL, LDLDIRECT in the last 72 hours. Thyroid  function studies No results for input(s): TSH, T4TOTAL, T3FREE, THYROIDAB in the last 72 hours.  Invalid input(s): FREET3 Anemia work up No results for input(s): VITAMINB12, FOLATE, FERRITIN, TIBC, IRON, RETICCTPCT in the last 72 hours. Urinalysis    Component Value Date/Time   COLORURINE YELLOW 03/08/2019 1457   APPEARANCEUR CLEAR 03/08/2019 1457   LABSPEC 1.016 03/08/2019 1457   PHURINE 5.0 03/08/2019 1457   GLUCOSEU NEGATIVE 03/08/2019 1457   HGBUR NEGATIVE 03/08/2019 [place] 03/08/2019 1457   KETONESUR 5 (A) 03/08/2019 1457   PROTEINUR NEGATIVE 03/08/2019 1457   NITRITE NEGATIVE 03/08/2019 1457   LEUKOCYTESUR NEGATIVE 03/08/2019 1457   Sepsis Labs Invalid input(s): PROCALCITONIN,  WBC,  LACTICIDVEN Microbiology No results found for this or any previous visit (from the past 240 hour(s)).  Time coordinating discharge: 32 mins  SIGNED:  Irwin Brakeman, MD  [facility] 03/12/2019, 10:10 AM How to contact the [facility] Attending or Consulting provider [place] or covering provider during after hours [place], for this patient?  1. Check the care team in [facility] and look for a) attending/consulting TRH provider listed and b) the [facility] team listed 2. Log into [email or web address] and use [place]'s universal password to access. If you do not have the password, please contact the hospital operator. 3. Locate the [facility] provider you are looking for under [facility] and page to a number that you can be directly reached. 4. If you still have difficulty reaching the provider, please page the [facility] (Director on Call) for the Hospitalists listed on amion for assistance.

## 2019-03-12 NOTE — Telephone Encounter (Signed)
[facility] for appt tomorrow per dr Nicki Reaper. [facility] will call pt and schedule

## 2019-03-12 NOTE — TOC Initial Note (Signed)
Transition of Care [facility]) - Initial/Assessment Note    Patient Details  Name: [name] MRN: [id number] Date of Birth: 05/04/1928  Transition of Care [facility]) CM/SW Contact:    [place], LCSW Phone Number: 03/12/2019, 11:28 AM  Clinical Narrative:     CSW in contact with patient to conduct TOC assessment. Pt reports that she lives at home alone and receives support and assistance from friends and "people I call". Pt is guarded during the time of assessment. Pt is reluctant to answer CSW's questions regarding home care and community supports.   Pt reports that "any of my friends and neighbors" can assist me to my appointments. CSW asked for details regarding her supports and continues to not provide any specifics.  Pt reports that [facility] delivers her mediations and other needs. Pt also reports that she uses a cane to assist with ambulation.   CSW offered pt home health/PT/RN as recommended by PT. Pt declined and expressed [place] and did not want anybody in her home. CSW expressed understanding to her decision.   No futher needs at this time  [facility]  Clinical Social Worker  Ph: [phone]            Expected Discharge Plan: [place] Barriers to Discharge: No Barriers Identified   Patient Goals and CMS Choice Patient states their goals for this hospitalization and ongoing recovery are:: to discharge home with self care CMS Medicare.gov Compare Post Acute Care list provided to:: Patient Choice offered to / list presented to : Patient  Expected Discharge Plan and Services Expected Discharge Plan: [place] In-house Referral: Clinical Social Work   Post Acute Care Choice: [place] arrangements for the past 2 months: [name] Expected Discharge Date: 03/12/19                         HH Arranged: Refused HH          Prior  Living Arrangements/Services Living arrangements for the past 2 months: Single Family Home Lives with:: Self Patient language and need for interpreter reviewed:: Yes Do you feel safe going back to the place where you live?: Yes      Need for Family Participation in Patient Care: No (Comment) Care giver support system in place?: No (comment) Current home services: DME Criminal Activity/Legal Involvement Pertinent to Current Situation/Hospitalization: No - Comment as needed  Activities of Daily Living Home Assistive Devices/Equipment: Walker (specify type) ADL Screening (condition at time of admission) Patient's cognitive ability adequate to safely complete daily activities?: Yes Is the patient deaf or have difficulty hearing?: No Does the patient have difficulty seeing, even when wearing glasses/contacts?: No Does the patient have difficulty concentrating, remembering, or making decisions?: No Patient able to express need for assistance with ADLs?: Yes Does the patient have difficulty dressing or bathing?: No Independently performs ADLs?: Yes (appropriate for developmental age) Does the patient have difficulty walking or climbing stairs?: Yes Weakness of Legs: Both Weakness of Arms/Hands: None  Permission Sought/Granted Permission sought to share information with : Case Manager Permission granted to share information with : Yes, Verbal Permission Granted  Share Information with NAME: Maple Mirza  Permission granted to share info w AGENCY: n/a  Permission granted to share info w Relationship: other  Permission granted to share info w Contact Information: PH:[phone]  Emotional Assessment Appearance:: Appears  stated age Attitude/Demeanor/Rapport: Guarded Affect (typically observed): Stable, Accepting Orientation: : Oriented to Self, Oriented to Place, Oriented to  Time, Oriented to Situation Alcohol / Substance Use: Not Applicable Psych Involvement: No (comment)  Admission  diagnosis:  SBO (small bowel obstruction) ([place]) [K56.609] Abdominal pain [R10.9] Patient Active Problem List   Diagnosis Date Noted  . SBO (small bowel obstruction) ([place]) 03/08/2019  . Cecal cancer ([place]) 12/26/2018  . Lower GI bleed from cecal cancer 12/10/2018  . S/P colon resection 12/08/2018  . Iron deficiency anemia due to chronic blood loss   . Urothelial carcinoma of left distal ureter ([place])   . 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 CKD (chronic kidney disease), stage IV ([place]) 12/05/2018  . Acute anemia 12/04/2018  . Ureteral mass 05/11/2018  . Bladder cancer ([place]) 03/20/2018  . 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 Chronic respiratory failure with hypoxia ([place]) 08/13/2016  . Pulmonary nodule 07/19/2016  . Renal bruit 06/29/2016  . Dyspnea 05/04/2016  . COPD (chronic obstructive pulmonary disease) ([place]) 04/26/2016  . Breast cancer, right breast ([place]) 05/01/2015  . Osteoarthritis of both knees 04/21/2015  . Acute bronchitis with COPD ([place]) 03/21/2015  . Abnormal CXR 01/23/2015  . Prediabetes 10/15/2014  . Osteopenia 09/18/2013  . Peripheral neuropathy 08/13/2013  . Hyperglycemia 08/13/2013  . Essential hypertension, benign 09/20/2012  . Carotid artery disease ([place]) 09/20/2012  . Hyperlipidemia 09/20/2012  . Arthritis of right knee 06/08/2007  . KNEE PAIN 06/08/2007   PCP:  Kathyrn Drown, MD Pharmacy:   [facility], [address] [place] [address] Phone: [phone] Fax: [phone]     Social Determinants of Health (SDOH) Interventions    Readmission Risk Interventions Readmission Risk Prevention Plan 03/12/2019  Transportation Screening Complete  Medication Review ([place]) Complete  PCP or Specialist appointment within 3-5 days of discharge Complete  HRI or Home Care Consult Patient refused  SW  Recovery Care/Counseling Consult Patient refused  Palliative Care Screening Not [place] Not Applicable  Some recent data might be hidden
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
## 2019-03-12 NOTE — Discharge Instructions (Signed)
Please have a soft diet only for the next 2 weeks.   Soft-Food Eating Plan A soft-food eating plan includes foods that are safe and easy to chew and swallow. Your health care provider or dietitian can help you find foods and flavors that fit into this plan. Follow this plan until your health care provider or dietitian says it is safe to start eating other foods and food textures. What are tips for following this plan? General guidelines   Take small bites of food, or cut food into pieces about  inch or smaller. Bite-sized pieces of food are easier to chew and swallow.  Eat moist foods. Avoid overly dry foods.  Avoid foods that: ? Are difficult to swallow, such as dry, chunky, crispy, or sticky foods. ? Are difficult to chew, such as hard, tough, or stringy foods. ? Contain nuts, seeds, or fruits.  Follow instructions from your dietitian about the types of liquids that are safe for you to swallow. You may be allowed to have: ? Thick liquids only. This includes only liquids that are thicker than honey. ? Thin and thick liquids. This includes all beverages and foods that become liquid at room temperature.  To make thick liquids: ? Purchase a commercial liquid thickening powder. These are available at grocery stores and pharmacies. ? Mix the thickener into liquids according to instructions on the label. ? Purchase ready-made thickened liquids. ? Thicken soup by pureeing, straining to remove chunks, and adding flour, potato flakes, or corn starch. ? Add commercial thickener to foods that become liquid at room temperature, such as milk shakes, yogurt, ice cream, gelatin, and sherbet.  Ask your health care provider whether you need to take a fiber supplement. Cooking  Cook meats so they stay tender and moist. Use methods like braising, stewing, or baking in liquid.  Cook vegetables and fruit until they are soft enough to be mashed with a fork.  Peel soft, fresh fruits such as peaches,  nectarines, and melons.  When making soup, make sure chunks of meat and vegetables are smaller than  inch.  Reheat leftover foods slowly so that a tough crust does not form. What foods are allowed? The items listed below may not be a complete list. Talk with your dietitian about what dietary choices are best for you. Grains Breads, muffins, pancakes, or waffles moistened with syrup, jelly, or butter. Dry cereals well-moistened with milk. Moist, cooked cereals. Well-cooked pasta and rice. Vegetables All soft-cooked vegetables. Shredded lettuce. Fruits All canned and cooked fruits. Soft, peeled fresh fruits. Strawberries. Dairy Milk. Cream. Yogurt. Cottage cheese. Soft cheese without the rind. Meats and other protein foods Tender, moist ground meat, poultry, or fish. Meat cooked in gravy or sauces. Eggs. Sweets and desserts Ice cream. Milk shakes. Sherbet. Pudding. Fats and oils Butter. Margarine. Olive, canola, sunflower, and grapeseed oil. Smooth salad dressing. Smooth cream cheese. Mayonnaise. Gravy. What foods are not allowed? The items listed bemay not be a complete list. Talk with your dietitian about what dietary choices are best for you. Grains Coarse or dry cereals, such as bran, granola, and shredded wheat. Tough or chewy crusty breads, such as [place] bread or baguettes. Breads with nuts, seeds, or fruit. Vegetables All raw vegetables. Cooked corn. Cooked vegetables that are tough or stringy. Tough, crisp, fried potatoes and potato skins. Fruits Fresh fruits with skins or seeds, or both, such as apples, pears, and grapes. Stringy, high-pulp fruits, such as papaya, pineapple, coconut, and mango. Fruit leather and all dried  fruit. Dairy Yogurt with nuts or coconut. Meats and other protein foods Hard, dry sausages. Dry meat, poultry, or fish. Meats with gristle. Fish with bones. Fried meat or fish. Lunch meat and hotdogs. Nuts and seeds. Chunky peanut butter or other nut  butters. Sweets and desserts Cakes or cookies that are very dry or chewy. Desserts with dried fruit, nuts, or coconut. Fried pastries. Very rich pastries. Fats and oils Cream cheese with fruit or nuts. Salad dressings with seeds or chunks. Summary  A soft-food eating plan includes foods that are safe and easy to swallow. Generally, the foods should be soft enough to be mashed with a fork.  Avoid foods that are dry, hard to chew, crunchy, sticky, stringy, or crispy.  Ask your health care provider whether you need to thicken your liquids and if you need to take a fiber supplement. This information is not intended to replace advice given to you by your health care provider. Make sure you discuss any questions you have with your health care provider. Document Released: 06/08/2007 Document Revised: 06/22/2018 Document Reviewed: 05/04/2016 Elsevier Patient Education  2020 [employer].  IMPORTANT INFORMATION: PAY CLOSE ATTENTION   PHYSICIAN DISCHARGE INSTRUCTIONS  Follow with Primary care provider  Kathyrn Drown, MD  and other consultants as instructed by your Hospitalist Physician  [place] IF SYMPTOMS COME BACK, WORSEN OR NEW PROBLEM DEVELOPS   Please note: You were cared for by a hospitalist during your hospital stay. Every effort will be made to forward records to your primary care provider.  You can request that your primary care provider send for your hospital records if they have not received them.  Once you are discharged, your primary care physician will handle any further medical issues. Please note that NO REFILLS for any discharge medications will be authorized once you are discharged, as it is imperative that you return to your primary care physician (or establish a relationship with a primary care physician if you do not have one) for your post hospital discharge needs so that they can reassess your need for medications and monitor your lab  values.  Please get a complete blood count and chemistry panel checked by your Primary MD at your next visit, and again as instructed by your Primary MD.  Get Medicines reviewed and adjusted: Please take all your medications with you for your next visit with your Primary MD  Laboratory/radiological data: Please request your Primary MD to go over all hospital tests and procedure/radiological results at the follow up, please ask your primary care provider to get all Hospital records sent to his/her office.  In some cases, they will be blood work, cultures and biopsy results pending at the time of your discharge. Please request that your primary care provider follow up on these results.  If you are diabetic, please bring your blood sugar readings with you to your follow up appointment with primary care.    Please call and make your follow up appointments as soon as possible.    Also Note the following: If you experience worsening of your admission symptoms, develop shortness of breath, life threatening emergency, suicidal or homicidal thoughts you must seek medical attention immediately by calling 911 or calling your MD immediately  if symptoms less severe.  You must read complete instructions/literature along with all the possible adverse reactions/side effects for all the Medicines you take and that have been prescribed to you. Take any new Medicines after you  have completely understood and accpet all the possible adverse reactions/side effects.   Do not drive when taking Pain medications or sleeping medications (Benzodiazepines)  Do not take more than prescribed Pain, Sleep and Anxiety Medications. It is not advisable to combine anxiety,sleep and pain medications without talking with your primary care practitioner  Special Instructions: If you have smoked or chewed Tobacco  in the last 2 yrs please stop smoking, stop any regular Alcohol  and or any Recreational drug use.  Wear Seat belts  while driving.  Do not drive if taking any narcotic, mind altering or controlled substances or recreational drugs or alcohol.

## 2019-03-12 NOTE — Evaluation (Signed)
Physical Therapy Evaluation Patient Details Name: [name] MRN: [id number] DOB: 1928/11/22 Today's Date: 03/12/2019   History of Present Illness  [name]  is a 85 y.o. female  with PMHx significant for breast cancer, urothelial cancer involving the left ureter, HTH, , COPD/emphysema, h/o DM, chronic hypoxic respiratory failure requiring as needed oxygen, as well as history of cancer of the cecum status post right-sided colectomy on 12/08/2018 presents with persistent abdominal pain nausea vomiting change 03/02/2019    Clinical Impression  Patient functioning near baseline for functional mobility and gait, demonstrates slightly labored slow cadence during ambulation without loss of balance and tolerated sitting up in char after therapy - RN aware.  Plan:  Patient to be discharged home today and discharged from physical therapy to care of nursing for ambulation daily as tolerated for length of stay.     Follow Up Recommendations Home health PT;Supervision for mobility/OOB;Supervision - Intermittent    Equipment Recommendations  None recommended by PT    Recommendations for Other Services       Precautions / Restrictions Precautions Precautions: Fall Restrictions Weight Bearing Restrictions: No      Mobility  Bed Mobility Overal bed mobility: Modified Independent             General bed mobility comments: increased time  Transfers Overall transfer level: Needs assistance Equipment used: 1 person hand held assist Transfers: Sit to/from Stand;Stand Pivot Transfers Sit to Stand: Supervision;Min guard Stand pivot transfers: Supervision;Min guard       General transfer comment: slow labored movement  Ambulation/Gait Ambulation/Gait assistance: Min guard Gait Distance (Feet): 45 Feet Assistive device: Rolling walker (2 wheeled) Gait Pattern/deviations: Decreased step length - right;Decreased step length - left;Decreased stride length Gait velocity:  decreased   General Gait Details: slow slightly labored cadence without loss of balance, limited secondary to c/o fatigue  Stairs            Wheelchair Mobility    Modified Rankin (Stroke Patients Only)       Balance Overall balance assessment: Needs assistance Sitting-balance support: Feet supported;No upper extremity supported Sitting balance-Leahy Scale: Good Sitting balance - Comments: seated at EOB   Standing balance support: Bilateral upper extremity supported;During functional activity Standing balance-Leahy Scale: Fair Standing balance comment: using RW                             Pertinent Vitals/Pain Pain Assessment: No/denies pain    Home Living Family/patient expects to be discharged to:: Private residence Living Arrangements: Alone Available Help at Discharge: Friend(s);Family Type of Home: House Home Access: Stairs to enter Entrance Stairs-Rails: Psychiatric nurse of Steps: 5 Home Layout: One level Home Equipment: Cane - single point;Walker - 2 wheels      Prior Function Level of Independence: Independent with assistive device(s)         Comments: Household and short distanced community ambulator with RW     Hand Dominance   Dominant Hand: Right    Extremity/Trunk Assessment   Upper Extremity Assessment Upper Extremity Assessment: Overall WFL for tasks assessed    Lower Extremity Assessment Lower Extremity Assessment: Generalized weakness    Cervical / Trunk Assessment Cervical / Trunk Assessment: Kyphotic  Communication   Communication: No difficulties  Cognition Arousal/Alertness: Awake/alert Behavior During Therapy: WFL for tasks assessed/performed Overall Cognitive Status: Within Functional Limits for tasks assessed  General Comments      Exercises     Assessment/Plan    PT Assessment All further PT needs can be met in the next venue  of care  PT Problem List Decreased strength;Decreased activity tolerance;Decreased balance;Decreased mobility       PT Treatment Interventions      PT Goals (Current goals can be found in the Care Plan section)  Acute Rehab PT Goals Patient Stated Goal: return home with family/friends to assist PT Goal Formulation: With patient Time For Goal Achievement: 03/12/19 Potential to Achieve Goals: Good    Frequency     Barriers to discharge        Co-evaluation               AM-PAC PT "6 Clicks" Mobility  Outcome Measure Help needed turning from your back to your side while in a flat bed without using bedrails?: None Help needed moving from lying on your back to sitting on the side of a flat bed without using bedrails?: None Help needed moving to and from a bed to a chair (including a wheelchair)?: A Little Help needed standing up from a chair using your arms (e.g., wheelchair or bedside chair)?: A Little Help needed to walk in hospital room?: A Little Help needed climbing 3-5 steps with a railing? : A Little 6 Click Score: 20    End of Session   Activity Tolerance: Patient tolerated treatment well;Patient limited by fatigue Patient left: in chair;with call bell/phone within reach;with chair alarm set Nurse Communication: Mobility status PT Visit Diagnosis: Other abnormalities of gait and mobility (R26.89);Muscle weakness (generalized) (M62.81);Unsteadiness on feet (R26.81)    Time: 0413-6438 PT Time Calculation (min) (ACUTE ONLY): 26 min   Charges:   PT Evaluation $PT Eval Moderate Complexity: 1 Mod PT Treatments $Therapeutic Activity: 23-37 mins        12:26 PM, 03/12/19 Lonell Grandchild, MPT Physical Therapist with [facility] [phone] office [phone] mobile phone

## 2019-03-12 NOTE — Progress Notes (Signed)
Patient is to be discharged home and in stable condition. Patient given discharge instructions and verbalized understanding. IV removed, WNL. Patient given discharge instructions and verbalized understanding. Patient's POA, Matt Holmes, updated via telephone and to provide transportation at 1300. Patient to be escorted to awaiting vehicle by staff via wheelchair.  Celestia Khat, RN

## 2019-03-13 ENCOUNTER — Other Ambulatory Visit: Payer: Self-pay

## 2019-03-13 ENCOUNTER — Ambulatory Visit (INDEPENDENT_AMBULATORY_CARE_PROVIDER_SITE_OTHER): Payer: Medicare Other | Admitting: Family Medicine

## 2019-03-13 DIAGNOSIS — R197 Diarrhea, unspecified: Secondary | ICD-10-CM

## 2019-03-13 DIAGNOSIS — I10 Essential (primary) hypertension: Secondary | ICD-10-CM | POA: Diagnosis not present

## 2019-03-13 DIAGNOSIS — N184 Chronic kidney disease, stage 4 (severe): Secondary | ICD-10-CM | POA: Diagnosis not present

## 2019-03-13 NOTE — Addendum Note (Signed)
Addended by: Sallee Lange A on: 03/13/2019 09:31 PM   Modules accepted: Level of Service

## 2019-03-13 NOTE — Progress Notes (Signed)
   Subjective:    Patient ID: [name], female    DOB: 1928-05-28, 85 y.o.   MRN: [id number] Virtual video visit HPIhospital follow up. Pt states abd pain is better but still having diarrhea.  Patient with partial small bowel obstruction Patient had significant troubles with nausea vomiting she had an NG tube she has had progressive weakness she also had confusion while in the hospital she was released yesterday and apparently is fairly weak able to eat and drink some.  Unable to do much in the way of housework.  She states she does have plenty of food at home.  PMH benign Virtual Visit via Telephone Note  I connected with [name] on 03/13/19 at 11:00 AM EST by telephone and verified that I am speaking with the correct person using two identifiers.  Location: Patient: home Provider: office   I discussed the limitations, risks, security and privacy concerns of performing an evaluation and management service by telephone and the availability of in person appointments. I also discussed with the patient that there may be a patient responsible charge related to this service. The patient expressed understanding and agreed to proceed.   History of Present Illness:    Observations/Objective:   Assessment and Plan:   Follow Up Instructions:    I discussed the assessment and treatment plan with the patient. The patient was provided an opportunity to ask questions and all were answered. The patient agreed with the plan and demonstrated an understanding of the instructions.   The patient was advised to call back or seek an in-person evaluation if the symptoms worsen or if the condition fails to improve as anticipated.  I provided 25 minutes of non-face-to-face time during this encounter.          Review of Systems  Constitutional: Negative for activity change and appetite change.  HENT: Negative for congestion and rhinorrhea.   Respiratory: Negative for cough  and shortness of breath.   Cardiovascular: Negative for chest pain and leg swelling.  Gastrointestinal: Positive for diarrhea and nausea. Negative for abdominal pain and vomiting.  Skin: Negative for color change.  Neurological: Negative for dizziness and weakness.  Psychiatric/Behavioral: Negative for agitation and confusion.       Objective:   Physical Exam Patient not in respiratory distress does not appear to be having any major setbacks other than weakness     Assessment & Plan:  Significant diarrhea and fatigue we will need to do stool studies for C. difficile PCR but will also need to do lab work as well consult home health Face-to-face evaluation patient would benefit benefit from home health she would also benefit from lab work being done at the home as well as physical therapy for helping her build up she may also need to have an aide to help her with her care  We will do a home visit with her next week because it is impossible for her to come to the office currently

## 2019-03-15 ENCOUNTER — Inpatient Hospital Stay (HOSPITAL_COMMUNITY): Payer: Medicare Other

## 2019-03-15 ENCOUNTER — Inpatient Hospital Stay (HOSPITAL_COMMUNITY)
Admission: EM | Admit: 2019-03-15 | Discharge: 2019-03-19 | DRG: 389 | Disposition: A | Discharge status: Home / Self Care | Payer: Medicare Other | Attending: Internal Medicine | Admitting: Internal Medicine

## 2019-03-15 ENCOUNTER — Encounter (HOSPITAL_COMMUNITY): Payer: Self-pay | Admitting: Emergency Medicine

## 2019-03-15 ENCOUNTER — Telehealth: Payer: Self-pay | Admitting: *Deleted

## 2019-03-15 ENCOUNTER — Other Ambulatory Visit: Payer: Self-pay

## 2019-03-15 ENCOUNTER — Emergency Department (HOSPITAL_COMMUNITY): Payer: Medicare Other

## 2019-03-15 DIAGNOSIS — E1122 Type 2 diabetes mellitus with diabetic chronic kidney disease: Secondary | ICD-10-CM | POA: Diagnosis present

## 2019-03-15 DIAGNOSIS — M810 Age-related osteoporosis without current pathological fracture: Secondary | ICD-10-CM | POA: Diagnosis present

## 2019-03-15 DIAGNOSIS — K56609 Unspecified intestinal obstruction, unspecified as to partial versus complete obstruction: Secondary | ICD-10-CM | POA: Diagnosis not present

## 2019-03-15 DIAGNOSIS — N179 Acute kidney failure, unspecified: Secondary | ICD-10-CM | POA: Diagnosis present

## 2019-03-15 DIAGNOSIS — Z66 Do not resuscitate: Secondary | ICD-10-CM | POA: Diagnosis present

## 2019-03-15 DIAGNOSIS — N183 Chronic kidney disease, stage 3 unspecified: Secondary | ICD-10-CM | POA: Diagnosis present

## 2019-03-15 DIAGNOSIS — Z8719 Personal history of other diseases of the digestive system: Secondary | ICD-10-CM

## 2019-03-15 DIAGNOSIS — J9611 Chronic respiratory failure with hypoxia: Secondary | ICD-10-CM | POA: Diagnosis present

## 2019-03-15 DIAGNOSIS — J449 Chronic obstructive pulmonary disease, unspecified: Secondary | ICD-10-CM | POA: Diagnosis present

## 2019-03-15 DIAGNOSIS — C679 Malignant neoplasm of bladder, unspecified: Secondary | ICD-10-CM | POA: Diagnosis present

## 2019-03-15 DIAGNOSIS — M1990 Unspecified osteoarthritis, unspecified site: Secondary | ICD-10-CM | POA: Diagnosis present

## 2019-03-15 DIAGNOSIS — G629 Polyneuropathy, unspecified: Secondary | ICD-10-CM | POA: Diagnosis present

## 2019-03-15 DIAGNOSIS — I129 Hypertensive chronic kidney disease with stage 1 through stage 4 chronic kidney disease, or unspecified chronic kidney disease: Secondary | ICD-10-CM | POA: Diagnosis present

## 2019-03-15 DIAGNOSIS — N951 Menopausal and female climacteric states: Secondary | ICD-10-CM | POA: Diagnosis present

## 2019-03-15 DIAGNOSIS — Z85038 Personal history of other malignant neoplasm of large intestine: Secondary | ICD-10-CM

## 2019-03-15 DIAGNOSIS — Z9049 Acquired absence of other specified parts of digestive tract: Secondary | ICD-10-CM

## 2019-03-15 DIAGNOSIS — Z881 Allergy status to other antibiotic agents status: Secondary | ICD-10-CM | POA: Diagnosis not present

## 2019-03-15 DIAGNOSIS — Z923 Personal history of irradiation: Secondary | ICD-10-CM

## 2019-03-15 DIAGNOSIS — Z7951 Long term (current) use of inhaled steroids: Secondary | ICD-10-CM

## 2019-03-15 DIAGNOSIS — E785 Hyperlipidemia, unspecified: Secondary | ICD-10-CM | POA: Diagnosis present

## 2019-03-15 DIAGNOSIS — I10 Essential (primary) hypertension: Secondary | ICD-10-CM | POA: Diagnosis present

## 2019-03-15 DIAGNOSIS — C50911 Malignant neoplasm of unspecified site of right female breast: Secondary | ICD-10-CM | POA: Diagnosis present

## 2019-03-15 DIAGNOSIS — E876 Hypokalemia: Secondary | ICD-10-CM | POA: Diagnosis present

## 2019-03-15 DIAGNOSIS — I779 Disorder of arteries and arterioles, unspecified: Secondary | ICD-10-CM | POA: Diagnosis present

## 2019-03-15 DIAGNOSIS — E870 Hyperosmolality and hypernatremia: Secondary | ICD-10-CM | POA: Diagnosis present

## 2019-03-15 DIAGNOSIS — Z888 Allergy status to other drugs, medicaments and biological substances status: Secondary | ICD-10-CM

## 2019-03-15 DIAGNOSIS — Z885 Allergy status to narcotic agent status: Secondary | ICD-10-CM | POA: Diagnosis not present

## 2019-03-15 DIAGNOSIS — Z800 Family history of malignant neoplasm of digestive organs: Secondary | ICD-10-CM

## 2019-03-15 DIAGNOSIS — K5651 Intestinal adhesions [bands], with partial obstruction: Secondary | ICD-10-CM | POA: Diagnosis present

## 2019-03-15 DIAGNOSIS — Z170 Estrogen receptor positive status [ER+]: Secondary | ICD-10-CM

## 2019-03-15 DIAGNOSIS — Z8550 Personal history of malignant neoplasm of unspecified urinary tract organ: Secondary | ICD-10-CM

## 2019-03-15 DIAGNOSIS — Z90710 Acquired absence of both cervix and uterus: Secondary | ICD-10-CM

## 2019-03-15 DIAGNOSIS — Z79899 Other long term (current) drug therapy: Secondary | ICD-10-CM

## 2019-03-15 DIAGNOSIS — Z87891 Personal history of nicotine dependence: Secondary | ICD-10-CM | POA: Diagnosis not present

## 2019-03-15 DIAGNOSIS — C180 Malignant neoplasm of cecum: Secondary | ICD-10-CM | POA: Diagnosis present

## 2019-03-15 DIAGNOSIS — Z20822 Contact with and (suspected) exposure to covid-19: Secondary | ICD-10-CM | POA: Diagnosis present

## 2019-03-15 DIAGNOSIS — Z853 Personal history of malignant neoplasm of breast: Secondary | ICD-10-CM

## 2019-03-15 DIAGNOSIS — R531 Weakness: Secondary | ICD-10-CM

## 2019-03-15 LAB — CBC
HCT: 32.9 % — ABNORMAL LOW (ref 36.0–46.0)
Hemoglobin: 10.1 g/dL — ABNORMAL LOW (ref 12.0–15.0)
MCH: 29.2 pg (ref 26.0–34.0)
MCHC: 30.7 g/dL (ref 30.0–36.0)
MCV: 95.1 fL (ref 80.0–100.0)
Platelets: 350 10*3/uL (ref 150–400)
RBC: 3.46 MIL/uL — ABNORMAL LOW (ref 3.87–5.11)
RDW: 15.6 % — ABNORMAL HIGH (ref 11.5–15.5)
WBC: 10.9 10*3/uL — ABNORMAL HIGH (ref 4.0–10.5)
nRBC: 0 % (ref 0.0–0.2)

## 2019-03-15 LAB — URINALYSIS, ROUTINE W REFLEX MICROSCOPIC
Bilirubin Urine: NEGATIVE
Glucose, UA: NEGATIVE mg/dL
Hgb urine dipstick: NEGATIVE
Ketones, ur: NEGATIVE mg/dL
Leukocytes,Ua: NEGATIVE
Nitrite: NEGATIVE
Protein, ur: NEGATIVE mg/dL
Specific Gravity, Urine: 1.015 (ref 1.005–1.030)
pH: 5 (ref 5.0–8.0)

## 2019-03-15 LAB — COMPREHENSIVE METABOLIC PANEL
ALT: 24 U/L (ref 0–44)
AST: 19 U/L (ref 15–41)
Albumin: 3.4 g/dL — ABNORMAL LOW (ref 3.5–5.0)
Alkaline Phosphatase: 80 U/L (ref 38–126)
Anion gap: 16 — ABNORMAL HIGH (ref 5–15)
BUN: 57 mg/dL — ABNORMAL HIGH (ref 8–23)
CO2: 19 mmol/L — ABNORMAL LOW (ref 22–32)
Calcium: 7.6 mg/dL — ABNORMAL LOW (ref 8.9–10.3)
Chloride: 101 mmol/L (ref 98–111)
Creatinine, Ser: 2.31 mg/dL — ABNORMAL HIGH (ref 0.44–1.00)
GFR calc Af Amer: 21 mL/min — ABNORMAL LOW (ref >60)
GFR calc non Af Amer: 18 mL/min — ABNORMAL LOW (ref >60)
Glucose, Bld: 104 mg/dL — ABNORMAL HIGH (ref 70–99)
Potassium: 3.8 mmol/L (ref 3.5–5.1)
Sodium: 136 mmol/L (ref 135–145)
Total Bilirubin: 0.4 mg/dL (ref 0.3–1.2)
Total Protein: 6.3 g/dL — ABNORMAL LOW (ref 6.5–8.1)

## 2019-03-15 LAB — LIPASE, BLOOD: Lipase: 52 U/L — ABNORMAL HIGH (ref 11–51)

## 2019-03-15 MED ORDER — HYDROMORPHONE HCL 1 MG/ML IJ SOLN: 1.0000 mg | INTRAMUSCULAR | Status: DC | PRN

## 2019-03-15 MED ORDER — ADULT MULTIVITAMIN W/MINERALS CH: 1.0000 | ORAL_TABLET | Freq: Every day | ORAL | Status: DC

## 2019-03-15 MED ORDER — NORTRIPTYLINE HCL 10 MG PO CAPS: 10.0000 mg | ORAL_CAPSULE | Freq: Every day | ORAL | Status: DC

## 2019-03-15 MED ORDER — AMLODIPINE BESYLATE 5 MG PO TABS: 5.0000 mg | ORAL_TABLET | Freq: Every day | ORAL | Status: DC

## 2019-03-15 MED ORDER — SODIUM CHLORIDE 0.9% FLUSH
3.0000 mL | Freq: Once | INTRAVENOUS | Status: AC
  Administered 2019-03-15: 3 mL via INTRAVENOUS

## 2019-03-15 MED ORDER — NORTRIPTYLINE HCL 10 MG PO CAPS
10.0000 mg | ORAL_CAPSULE | Freq: Every day | ORAL | Status: DC
  Filled 2019-03-15: qty 2

## 2019-03-15 MED ORDER — ONDANSETRON HCL 4 MG/2ML IJ SOLN
4.0000 mg | Freq: Four times a day (QID) | INTRAMUSCULAR | Status: DC | PRN
  Administered 2019-03-17 – 2019-03-18 (×5): 4 mg via INTRAVENOUS
  Filled 2019-03-15 (×5): qty 2

## 2019-03-15 MED ORDER — ADULT MULTIVITAMIN W/MINERALS CH
1.0000 | ORAL_TABLET | Freq: Every day | ORAL | Status: DC
  Filled 2019-03-15: qty 1

## 2019-03-15 MED ORDER — ONDANSETRON HCL 4 MG PO TABS: 4.0000 mg | ORAL_TABLET | Freq: Four times a day (QID) | ORAL | Status: DC | PRN

## 2019-03-15 MED ORDER — ONDANSETRON HCL 4 MG/2ML IJ SOLN
4.0000 mg | Freq: Once | INTRAMUSCULAR | Status: AC
  Administered 2019-03-15: 4 mg via INTRAVENOUS
  Filled 2019-03-15: qty 2

## 2019-03-15 MED ORDER — LACTATED RINGERS IV SOLN
INTRAVENOUS | Status: DC
  Administered 2019-03-15: 999 mL via INTRAVENOUS

## 2019-03-15 MED ORDER — ANASTROZOLE 1 MG PO TABS: 1.0000 mg | ORAL_TABLET | Freq: Every evening | ORAL | Status: DC

## 2019-03-15 MED ORDER — HYDROMORPHONE HCL 1 MG/ML IJ SOLN: 0.5000 mg | INTRAMUSCULAR | Status: DC | PRN

## 2019-03-15 MED ORDER — ANASTROZOLE 1 MG PO TABS
1.0000 mg | ORAL_TABLET | Freq: Every evening | ORAL | Status: DC
  Filled 2019-03-15 (×3): qty 1

## 2019-03-15 MED ORDER — POLYETHYLENE GLYCOL 3350 17 G PO PACK: 17.0000 g | PACK | Freq: Every day | ORAL | Status: DC | PRN

## 2019-03-15 MED ORDER — SODIUM CHLORIDE 0.9% FLUSH
3.0000 mL | Freq: Two times a day (BID) | INTRAVENOUS | Status: DC
  Administered 2019-03-16 – 2019-03-17 (×3): 3 mL via INTRAVENOUS

## 2019-03-15 MED ORDER — SODIUM CHLORIDE 0.9% FLUSH: 3.0000 mL | INTRAVENOUS | Status: DC | PRN

## 2019-03-15 MED ORDER — SODIUM CHLORIDE 0.9 % IV BOLUS
1000.0000 mL | Freq: Once | INTRAVENOUS | Status: AC
  Administered 2019-03-15: 1000 mL via INTRAVENOUS

## 2019-03-15 MED ORDER — ACETAMINOPHEN 650 MG RE SUPP: 650.0000 mg | Freq: Four times a day (QID) | RECTAL | Status: DC | PRN

## 2019-03-15 MED ORDER — SODIUM CHLORIDE 0.9 % IV SOLN: 250.0000 mL | INTRAVENOUS | Status: DC | PRN

## 2019-03-15 MED ORDER — ACETAMINOPHEN 325 MG PO TABS: 650.0000 mg | ORAL_TABLET | Freq: Four times a day (QID) | ORAL | Status: DC | PRN

## 2019-03-15 MED ORDER — HEPARIN SODIUM (PORCINE) 5000 UNIT/ML IJ SOLN
5000.0000 [IU] | Freq: Three times a day (TID) | INTRAMUSCULAR | Status: DC
  Administered 2019-03-15 – 2019-03-19 (×10): 5000 [IU] via SUBCUTANEOUS
  Filled 2019-03-15 (×10): qty 1

## 2019-03-15 MED ORDER — BISACODYL 10 MG RE SUPP: 10.0000 mg | Freq: Every day | RECTAL | Status: DC | PRN

## 2019-03-15 NOTE — ED Triage Notes (Addendum)
Per EMS patient's PCP called EMS to come get patient. Patient called her doctor's office complaining about abdominal pain that was not getting better. Patient denies nausea, but states lower abdominal pain. NAD.  Was seen for the same Dec 24th at [facility] ED.  History of bowel obstruction.

## 2019-03-15 NOTE — ED Provider Notes (Signed)
[facility] EMERGENCY DEPARTMENT Provider Note   CSN: [id number] Arrival date & time: 03/15/19  1456     History Chief Complaint  Patient presents with  . Abdominal Pain    [name] is a 85 y.o. female with a history as outlined below, most significant for COPD, diabetes, hypertension, with a recent history of colon cancer with partial colon resection 3 months ago who was admitted here last week for small bowel obstruction, discharged 3 days ago, but returns for persistent abdominal pain and distention along with nausea and reduced appetite.  She denies fevers or chills, no vomiting, she states she has been unable to tolerate any p.o. intake in the past 24 hours.  She feels very weak and dehydrated.  She contacted her PCP today who recommended she return to the ED.  She has found no alleviators for her symptoms.  She lives alone and has been obtaining assistance from a close relative, but has been having difficulty caring for herself with her current state of weakness.  The history is provided by the patient.       Past Medical History:  Diagnosis Date  . Breast cancer ([place])   . Breast cancer, right breast ([place]) 05/01/2015  . Cancer (HCC)    BREAST CANCER   . Colon polyps   . COPD (chronic obstructive pulmonary disease) ([place])    DR. [place]  . Diabetes mellitus without complication ([place])    PREDIABETIC  CONTROL W/ DIET  . Hypercholesteremia   . Hypertension   . Lung nodules   . Osteoarthritis   . Osteopenia 09/18/2013  . Osteoporosis   . Personal history of radiation therapy   . PONV (postoperative nausea and vomiting)    ' i get so sick with anesthesia"   . Shortness of breath dyspnea    W/ EXERTION  . Tick bite    3 years ago     Patient Active Problem List   Diagnosis Date Noted  . SBO (small bowel obstruction) ([place]) 03/08/2019  . 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 Lower GI bleed from cecal cancer 12/10/2018  . S/P colon resection 12/08/2018  . Iron  deficiency anemia due to chronic blood loss   . 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 CKD (chronic kidney disease), stage IV ([place]) 12/05/2018  . Acute anemia 12/04/2018  . Ureteral mass 05/11/2018  . Bladder cancer ([place]) 03/20/2018  . 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 Chronic respiratory failure with hypoxia ([place]) 08/13/2016  . Pulmonary nodule 07/19/2016  . Renal bruit 06/29/2016  . Dyspnea 05/04/2016  . COPD (chronic obstructive pulmonary disease) ([place]) 04/26/2016  . Breast cancer, right breast ([place]) 05/01/2015  . Osteoarthritis of both knees 04/21/2015  . Acute bronchitis with COPD ([place]) 03/21/2015  . Abnormal CXR 01/23/2015  . Prediabetes 10/15/2014  . Osteopenia 09/18/2013  . Peripheral neuropathy 08/13/2013  . Hyperglycemia 08/13/2013  . Essential hypertension, benign 09/20/2012  . Carotid artery disease ([place]) 09/20/2012  . Hyperlipidemia 09/20/2012  . Arthritis of right knee 06/08/2007  . KNEE PAIN 06/08/2007    Past Surgical History:  Procedure Laterality Date  . ABDOMINAL HYSTERECTOMY    . APPENDECTOMY    . BARTHOLIN GLAND CYST EXCISION    . BIOPSY  12/06/2018   Procedure: BIOPSY;  Surgeon: Rogene Houston, MD;  [facility];  Service:  Endoscopy;;  cecal mass biopsies   . BREAST EXCISIONAL BIOPSY Left    benign  . BREAST LUMPECTOMY WITH RADIOACTIVE SEED LOCALIZATION Right 05/23/2015   Procedure: RIGHT BREAST LUMPECTOMY WITH RADIOACTIVE SEED LOCALIZATION;  Surgeon: Fanny Skates, MD;  Location: [facility];  Service: General;  Laterality: Right;  . BREAST SURGERY     2000  LEFT BREAST  LUMP REMOVED  . cataract surgeries  Bilateral   . COLON RESECTION N/A 12/08/2018   Procedure: OPEN SEGMENTAL COLON RESECTION;  Surgeon: Fanny Skates, MD;  Location: [facility];  Service: General;  Laterality: N/A;  .  colonoscopy    . COLONOSCOPY  03/25/2011   Procedure: COLONOSCOPY;  Surgeon: Rogene Houston, MD;  Location[facility];  Service: Endoscopy;  Laterality: N/A;  9:30 am  . COLONOSCOPY N/A 12/06/2018   Procedure: COLONOSCOPY;  Surgeon: Rogene Houston, MD;  [facility];  Service: Endoscopy;  Laterality: N/A;  . CYSTOSCOPY W/ URETERAL STENT PLACEMENT Left 02/27/2018   Procedure: CYSTOSCOPY WITH LEFT RETROGRADE PYELOGRAM AND lEFT URETEROSCOPY WITH BIOPSY AND LEFT URETERAL STENT PLACEMENT;  Surgeon: Cleon Gustin, MD;  Location: [facility];  Service: Urology;  Laterality: Left;  . CYSTOSCOPY W/ URETERAL STENT PLACEMENT Right 12/08/2018   Procedure: CYSTOSCOPY WITH RETROGRADE PYELOGRAM/URETERAL STENT PLACEMENT;  Surgeon: Raynelle Bring, MD;  Location: [facility];  Service: Urology;  Laterality: Right;  . left inguinal hernia repair    . multiple toe surgeries    . ROBOT ASSITED LAPAROSCOPIC NEPHROURETERECTOMY Left 05/11/2018   Procedure: XI ROBOT ASSITED LAPAROSCOPIC LEFT DISTAL URETERECTOMY, URETERAL  REIMPLANTATION, WITH PSOAS HITCH AND BOARI FLAP. PELVIC LYMPH NODE DISSCETION AND LEFT URETERAL STENT PLACEMENT;  Surgeon: Cleon Gustin, MD;  Location: [facility];  Service: Urology;  Laterality: Left;  . TONSILLECTOMY       OB History   No obstetric history on file.     Family History  Problem Relation Age of Onset  . Colon cancer Father     Social History   Tobacco Use  . Smoking status: Former Smoker    Packs/day: 2.00    Years: 50.00    Pack years: 100.00    Types: Cigarettes    Quit date: 03/15/1988    Years since quitting: 31.0  . Smokeless tobacco: Never Used  Substance Use Topics  . Alcohol use: Yes    Alcohol/week: 0.0 standard drinks    Comment: seldom   . Drug use: No    Home Medications Prior to Admission medications   Medication Sig Start Date End Date Taking? Authorizing Provider  amLODipine (NORVASC) 5 MG tablet TAKE ONE TABLET BY MOUTH ONCE  DAILY. Patient taking differently: Take 5 mg by mouth daily.  11/14/18  Yes Kathyrn Drown, MD  anastrozole (ARIMIDEX) 1 MG tablet Take 1 tablet (1 mg total) by mouth daily. Patient taking differently: Take 1 mg by mouth every evening.  11/04/17  Yes Derek Jack, MD  fluticasone [facility]) 50 MCG/ACT nasal spray Place 2 sprays into both nostrils daily as needed for allergies.    Yes [provider]  furosemide (LASIX) 20 MG tablet Take 1 tablet (20 mg total) by mouth daily as needed for fluid. 03/12/19  Yes Johnson, Clanford L, MD  hydrocortisone (ANUSOL-HC) 2.5 % rectal cream Place 1 application rectally 2 (two) times daily as needed for hemorrhoids or anal itching.  12/27/18  Yes [provider]  levocetirizine (XYZAL) 5 MG tablet Take 5 mg by mouth daily as needed  for allergies.  10/27/18  Yes [provider]  Multiple Vitamins-Minerals (MULTIVITAMIN WITH MINERALS) tablet Take 1 tablet by mouth daily. 12/15/18 12/15/19 Yes Mercy Riding, MD  nortriptyline (PAMELOR) 10 MG capsule TAKE 2 TO 3 CAPSULES BY MOUTH NIGHTLY. Patient taking differently: Take 10-20 mg by mouth at bedtime.  11/14/18  Yes Luking, Elayne Snare, MD  OXYGEN Inhale 2 L into the lungs. As needed for activities requiring exertion, per patient uses very sparingly   Yes [provider]  potassium chloride (KLOR-CON) 10 MEQ tablet Take one tablet bid on the days you take lasix ( furosemide) Patient taking differently: Take 10 mEq by mouth See admin instructions. Take one tablet bid on the days you take lasix ( furosemide) 12/28/18  Yes Luking, Elayne Snare, MD  PROAIR RESPICLICK 970 (90 Base) MCG/ACT AEPB INHALE 1 PUFF INTO THE LUNGS EVERY 4 HOURS AS NEEDED. Patient taking differently: Inhale 1 puff into the lungs every 4 (four) hours as needed (for shortness of breath/wheezing).  03/24/18  Yes Juanito Doom, MD    Allergies    Augmentin [amoxicillin-pot clavulanate], Amoxicillin, Codeine, Morphine,  and Neomycin-bacitracin zn-polymyx  Review of Systems   Review of Systems  Constitutional: Positive for appetite change. Negative for chills and fever.  HENT: Negative for congestion and sore throat.   Eyes: Negative.   Respiratory: Negative for chest tightness and shortness of breath.   Cardiovascular: Negative for chest pain.  Gastrointestinal: Positive for abdominal distention, abdominal pain and nausea. Negative for vomiting.  Genitourinary: Negative.   Musculoskeletal: Negative for arthralgias, joint swelling and neck pain.  Skin: Negative.  Negative for rash and wound.  Neurological: Negative for dizziness, weakness, light-headedness, numbness and headaches.  Psychiatric/Behavioral: Negative.     Physical Exam Updated Vital Signs BP (!) 145/75 (BP Location: Right Arm)   Pulse 95   Temp 98 F (36.7 C) (Oral)   Resp 15   SpO2 95%   Physical Exam Vitals and nursing note reviewed.  Constitutional:      Appearance: She is well-developed.  HENT:     Head: Normocephalic and atraumatic.     Mouth/Throat:     Mouth: Mucous membranes are dry.  Eyes:     Conjunctiva/sclera: Conjunctivae normal.  Cardiovascular:     Rate and Rhythm: Normal rate and regular rhythm.     Heart sounds: Normal heart sounds.  Pulmonary:     Effort: Pulmonary effort is normal.     Breath sounds: Normal breath sounds. No wheezing.  Abdominal:     General: Bowel sounds are increased. There is distension.     Palpations: Abdomen is soft. There is no fluid wave or pulsatile mass.     Tenderness: There is abdominal tenderness in the right upper quadrant, epigastric area and left upper quadrant. There is no guarding or rebound.  Musculoskeletal:        General: Normal range of motion.     Cervical back: Normal range of motion.  Skin:    General: Skin is warm and dry.  Neurological:     Mental Status: She is alert.     ED Results / Procedures / Treatments   Labs (all labs ordered are listed, but  only abnormal results are displayed) Labs Reviewed  LIPASE, BLOOD - Abnormal; Notable for the following components:      Result Value   Lipase 52 (*)    All other components within normal limits  COMPREHENSIVE METABOLIC PANEL - Abnormal; Notable for the  following components:   CO2 19 (*)    Glucose, Bld 104 (*)    BUN 57 (*)    Creatinine, Ser 2.31 (*)    Calcium 7.6 (*)    Total Protein 6.3 (*)    Albumin 3.4 (*)    GFR calc non Af Amer 18 (*)    GFR calc Af Amer 21 (*)    Anion gap 16 (*)    All other components within normal limits  CBC - Abnormal; Notable for the following components:   WBC 10.9 (*)    RBC 3.46 (*)    Hemoglobin 10.1 (*)    HCT 32.9 (*)    RDW 15.6 (*)    All other components within normal limits  URINALYSIS, ROUTINE W REFLEX MICROSCOPIC - Abnormal; Notable for the following components:   APPearance HAZY (*)    All other components within normal limits  SARS CORONAVIRUS 2 (TAT 6-24 HRS)    EKG None  Radiology CT ABDOMEN PELVIS WO CONTRAST  Result Date: 03/15/2019 CLINICAL DATA:  Follow-up small-bowel obstruction EXAM: CT ABDOMEN AND PELVIS WITHOUT CONTRAST TECHNIQUE: Multidetector CT imaging of the abdomen and pelvis was performed following the standard protocol without IV contrast. COMPARISON:  03/08/2019 FINDINGS: Lower chest: No acute abnormality. Hepatobiliary: Gallbladder is partially contracted. Stable appearance of scattered low-density lesions throughout the liver including a subcapsular lesion at the posterior aspect of the right hepatic lobe containing dystrophic calcification. No definite new hepatic lesion identified. No biliary dilatation. Pancreas: Unremarkable. No pancreatic ductal dilatation or surrounding inflammatory changes. Spleen: Normal in size without focal abnormality. Adrenals/Urinary Tract: Adrenal glands are unremarkable. Kidneys are within normal limits without focal lesion or hydronephrosis. There are renal vascular  calcifications. Bladder is unremarkable. Stomach/Bowel: Persistent small bowel obstruction with transition point in the right hemipelvis (series 6, images 30-34). The degree of bowel dilatation is similar to the previous exam. By the colon remains largely collapsed. Stomach is mildly distended as well. Vascular/Lymphatic: Aortic atherosclerosis. No enlarged abdominal or pelvic lymph nodes. Reproductive: Status post hysterectomy. No adnexal masses. Other: No ascites. No pneumoperitoneum. No abdominal wall hernia. Musculoskeletal: No new or acute osseous findings. IMPRESSION: 1. Persistent small bowel obstruction with transition point in the right hemipelvis, likely secondary to adhesion. No free fluid or free air to suggest perforation. 2. Additional chronic findings, as above. Aortic Atherosclerosis (ICD10-I70.0). Electronically Signed   By: Davina Poke D.O.   On: 03/15/2019 18:43    Procedures Procedures (including critical care time)  Medications Ordered in ED Medications  sodium chloride flush (NS) 0.9 % injection 3 mL (3 mLs Intravenous Given 03/15/19 1757)  sodium chloride 0.9 % bolus 1,000 mL (0 mLs Intravenous Stopped 03/15/19 1912)  ondansetron (ZOFRAN) injection 4 mg (4 mg Intravenous Given 03/15/19 1752)    ED Course  I have reviewed the triage vital signs and the nursing notes.  Pertinent labs & imaging results that were available during my care of the patient were reviewed by me and considered in my medical decision making (see chart for details).    MDM Rules/Calculators/A&P                      Labs were reviewed and interpreted by me and consistent with dehydration including elevated BUN and creatinine, although she does have renal insufficiency at baseline her creatinine is 2.31 today, it was 1.59 just 3 days ago.  WBC count is borderline elevated at 10.9.  Lipase also minimally bumped.  Her CT scan was reviewed and is consistent for either a return of or a small bowel  obstruction that never completely resolved.  Patient will require admission, will plan to discuss with general surgery, but anticipated medical admission.  An NG tube will be placed to help decompress her small bowel.  Discussed pt with Dr. Arnoldo Morale who will consult pt.  Requested NG tube 16 or 18 [place] if possible, not smaller.   Discussed with Dr. Scherrie November who accepts pt for admission. Final Clinical Impression(s) / ED Diagnoses Final diagnoses:  SBO (small bowel obstruction) ([place])  Weakness    Rx / DC Orders ED Discharge Orders    None       Landis Martins 03/15/19 Geralyn Flash, MD 03/15/19 2014

## 2019-03-15 NOTE — H&P (Signed)
History and Physical    Patient Demographics:    [name] XBM:[id number] DOB: 1928-03-23 DOA: 03/15/2019  PCP: Babs Sciara, MD  Patient coming from: Home  I have personally briefly reviewed patient's old medical records in [facility]  Chief Complaint: Abdominal pain   Assessment & Plan:     Assessment/Plan Principal Problem:   SBO (small bowel obstruction) (HCC) Active Problems:   Essential hypertension, benign   Carotid artery disease (HCC)   Hyperlipidemia   Peripheral neuropathy   Breast cancer, right breast (HCC)   COPD (chronic obstructive pulmonary disease) (HCC)   Chronic respiratory failure with hypoxia (HCC)   Bladder cancer (HCC)   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     Principal Problem: Small bowel obstruction status post recent proximal right colectomy on 12/08/2018 for cecal malignancy Patient has distal small bowel obstruction which is recurrent, possibly secondary to adhesions.  Had a recent admission from 03/08/2019-03/12/2019 with similar presentation.  Was discharged home and started having recurrent symptoms over the last day.  Case discussed with Dr. Lovell Sheehan from general surgery who is willing to consult on the patient. -Place NG tube for decompression -IV Dilaudid as needed for pain control -IV Zofran as needed -Surgical consult from Dr. Henreitta Leber requested -N.p.o. for now -Monitor electrolytes closely -IV fluids while n.p.o.  Other Active Problems: Acute on chronic renal failure with baseline CKD stage III Creatinine is 2.3 on presentation.  Baseline appears to be in 1.5.  Appears to be worsening renal function secondary to dehydration, decreased p.o. intake.  Will give IV fluid resuscitation and monitor input output, renal function closely.  Avoid nephrotoxic medications.  H/o Cecal cancer/Stage II (T4BN0) cecal adenocarcinoma: -Right hemicolectomy on 12/08/2018 showed moderately differentiated  adenocarcinoma, margins clear, 0/16 lymph nodes positive, LVSI positive. Preoperative CEA of 12.8.CEA on 12/26/2018 came down to 10.1. -As per her oncologist Dr. Ellin Saba patient  does not require any adjuvant chemotherapy.  Hypertension: Continue amlodipine when able to tolerate p.o.  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 Ronne Binning. -Pathology showed PT2PN0 high-grade urothelial carcinoma, 0 out of 1 lymph node involved.  H/o Stage I right breast cancer: -Right breast lumpectomy on 05/23/2015, 0.7 cm IDC, grade 1, associated low-grade DCIS, margins negative, ER positive, PR negative, HER-2 negative, Ki-67 10%. -status Post prior radiation treatment -Anastrozole started in June 2017, she is continuing to tolerate with occasional hot flashes. -Mammogram on 09/19/2018 was BI-RADS Category 2.   DVT prophylaxis: Heparin Code Status:  Full code Family Communication: N/A  Disposition Plan: Admitted as inpatient for recurrent small bowel obstruction, will need 2 to 3-day inpatient stay Consults called: N/A Admission status: Inpatient status    HPI:     HPI: [name] is a 85 y.o. female with medical history significant of cecal adenocarcinoma, hypertension, urothelial carcinoma, breast cancer, COPD, prediabetes who presented to the ER with abdominal pain.  Patient was recently admitted here with a partial small bowel obstruction from 03/08/2019-03/12/2019.  She was discharged after tolerating oral feeds and having multiple bowel movements.  She states she has had recurrent symptoms in the last 24 hours with increased abdominal pain, distention, nausea, reduced appetite.  Unable to tolerate anything p.o. in the last 24 hours.  Feels really weak and dehydrated.  Contacted her PCP who recommended coming back to the ER.  Lives alone and is  usually independent at baseline.  Currently having difficulty caring for herself because of weakness. ED Course:  Vital Signs reviewed on presentation, significant for temperature 98, heart rate 95, blood pressure 145/75, saturation 95% on room air. Labs reviewed, significant for sodium 136, potassium 3.8, BUN 57, creatinine 2.3, bicarb 19, albumin 3.4, LFTs within normal limits, WBC count 10.9, hemoglobin 10.1, hematocrit 32.9, platelets 350, urinalysis is essentially unremarkable. Imaging personally Reviewed, CT of the abdomen pelvis shows persistent small bowel obstruction with transition point in the right hemipelvis likely secondary to adhesion.  No free air or free fluid.  The degree of bowel dilatation is similar to the previous exam.  The colon remains largely collapsed.  Stomach is mildly distended.    Review of systems:    Review of Systems: As per HPI otherwise 10 point review of systems negative.  All other review of systems is negative except the ones noted above in the HPI.    Past Medical and Surgical History:  Reviewed by me  Past Medical History:  Diagnosis Date  . Breast cancer (HCC)   . Breast cancer, right breast (HCC) 05/01/2015  . Cancer (HCC)    BREAST CANCER   . Colon polyps   . COPD (chronic obstructive pulmonary disease) (HCC)    DR. Kendrick Fries  . Diabetes mellitus without complication (HCC)    PREDIABETIC  CONTROL W/ DIET  . Hypercholesteremia   . Hypertension   . Lung nodules   . Osteoarthritis   . Osteopenia 09/18/2013  . Osteoporosis   . Personal history of radiation therapy   . PONV (postoperative nausea and vomiting)    ' i get so sick with anesthesia"   . Shortness of breath dyspnea    W/ EXERTION  . Tick bite    3 years ago     Past Surgical History:  Procedure Laterality Date  . ABDOMINAL HYSTERECTOMY    . APPENDECTOMY    . BARTHOLIN GLAND CYST EXCISION    . BIOPSY  12/06/2018   Procedure: BIOPSY;  Surgeon: Malissa Hippo, MD;  Location:  [facility];  Service: Endoscopy;;  cecal mass biopsies   . BREAST EXCISIONAL BIOPSY Left    benign  . BREAST LUMPECTOMY WITH RADIOACTIVE SEED LOCALIZATION Right 05/23/2015   Procedure: RIGHT BREAST LUMPECTOMY WITH RADIOACTIVE SEED LOCALIZATION;  Surgeon: Claud Kelp, MD;  Location: [facility];  Service: General;  Laterality: Right;  . BREAST SURGERY     2000  LEFT BREAST  LUMP REMOVED  . cataract surgeries  Bilateral   . COLON RESECTION N/A 12/08/2018   Procedure: OPEN SEGMENTAL COLON RESECTION;  Surgeon: Claud Kelp, MD;  Location: [facility];  Service: General;  Laterality: N/A;  . colonoscopy    . COLONOSCOPY  03/25/2011   Procedure: COLONOSCOPY;  Surgeon: Malissa Hippo, MD;  Location: [facility];  Service: Endoscopy;  Laterality: N/A;  9:30 am  . COLONOSCOPY N/A 12/06/2018   Procedure: COLONOSCOPY;  Surgeon: Malissa Hippo, MD;  Location: [facility];  Service: Endoscopy;  Laterality: N/A;  . CYSTOSCOPY W/ URETERAL STENT PLACEMENT Left 02/27/2018   Procedure: CYSTOSCOPY WITH LEFT RETROGRADE PYELOGRAM AND lEFT URETEROSCOPY WITH BIOPSY AND LEFT URETERAL STENT PLACEMENT;  Surgeon: Malen Gauze, MD;  Location: [facility];  Service: Urology;  Laterality: Left;  . CYSTOSCOPY W/ URETERAL STENT PLACEMENT Right 12/08/2018   Procedure: CYSTOSCOPY WITH RETROGRADE PYELOGRAM/URETERAL STENT PLACEMENT;  Surgeon: Heloise Purpura, MD;  Location: [facility];  Service: Urology;  Laterality: Right;  . left inguinal hernia repair    . multiple toe surgeries    . ROBOT ASSITED LAPAROSCOPIC NEPHROURETERECTOMY Left 05/11/2018   Procedure: XI ROBOT ASSITED LAPAROSCOPIC LEFT DISTAL URETERECTOMY, URETERAL  REIMPLANTATION, WITH PSOAS HITCH AND BOARI FLAP. PELVIC LYMPH NODE DISSCETION AND LEFT URETERAL STENT PLACEMENT;  Surgeon: Malen Gauze, MD;  Location: [facility];  Service: Urology;  Laterality: Left;  . TONSILLECTOMY       Social History:  Reviewed by me   reports that she quit smoking about 31 years  ago. Her smoking use included cigarettes. She has a 100.00 pack-year smoking history. She has never used smokeless tobacco. She reports current alcohol use. She reports that she does not use drugs.  Allergies:    Allergies  Allergen Reactions  . Augmentin [Amoxicillin-Pot Clavulanate] Nausea And Vomiting    Has patient had a PCN reaction causing immediate rash, facial/tongue/throat swelling, SOB or lightheadedness with hypotension: No Has patient had a PCN reaction causing severe rash involving mucus membranes or skin necrosis: No Has patient had a PCN reaction that required hospitalization: No Has patient had a PCN reaction occurring within the last 10 years: Unknown If all of the above answers are "NO", then may proceed with Cephalosporin use.    Marland Kitchen Amoxicillin Diarrhea    Has patient had a PCN reaction causing immediate rash, facial/tongue/throat swelling, SOB or lightheadedness with hypotension: No Has patient had a PCN reaction causing severe rash involving mucus membranes or skin necrosis: No Has patient had a PCN reaction that required hospitalization: No Has patient had a PCN reaction occurring within the last 10 years: Unknown If all of the above answers are "NO", then may proceed with Cephalosporin use.   . Codeine Nausea And Vomiting  . Morphine Nausea And Vomiting  . Neomycin-Bacitracin Zn-Polymyx Itching and Rash    Unable to use Neosporin    Family History :   Family History  Problem Relation Age of Onset  . Colon cancer Father    Family history reviewed, noted as above, not pertinent to current presentation.   Home Medications:    Prior to Admission medications   Medication Sig Start Date End Date Taking? Authorizing Provider  amLODipine (NORVASC) 5 MG tablet TAKE ONE TABLET BY MOUTH ONCE DAILY. Patient taking differently: Take 5 mg by mouth daily.  11/14/18  Yes Babs Sciara, MD  anastrozole (ARIMIDEX) 1 MG tablet Take 1 tablet (1 mg total) by mouth  daily. Patient taking differently: Take 1 mg by mouth every evening.  11/04/17  Yes Doreatha Massed, MD  fluticasone [facility]) 50 MCG/ACT nasal spray Place 2 sprays into both nostrils daily as needed for allergies.    Yes [provider]  furosemide (LASIX) 20 MG tablet Take 1 tablet (20 mg total) by mouth daily as needed for fluid. 03/12/19  Yes Johnson, Clanford L, MD  hydrocortisone (ANUSOL-HC) 2.5 % rectal cream Place 1 application rectally 2 (two) times daily as needed for hemorrhoids or anal itching.  12/27/18  Yes [provider]  levocetirizine (XYZAL) 5 MG tablet Take 5 mg by mouth daily as needed for allergies.  10/27/18  Yes [provider]  Multiple Vitamins-Minerals (MULTIVITAMIN WITH MINERALS) tablet Take 1 tablet by mouth daily. 12/15/18 12/15/19 Yes Almon Hercules, MD  nortriptyline (PAMELOR) 10 MG capsule TAKE 2 TO 3 CAPSULES BY MOUTH NIGHTLY. Patient taking differently: Take 10-20 mg by mouth at bedtime.  11/14/18  Yes Luking, Jonna Coup,  MD  OXYGEN Inhale 2 L into the lungs. As needed for activities requiring exertion, per patient uses very sparingly   Yes [provider]  potassium chloride (KLOR-CON) 10 MEQ tablet Take one tablet bid on the days you take lasix ( furosemide) Patient taking differently: Take 10 mEq by mouth See admin instructions. Take one tablet bid on the days you take lasix ( furosemide) 12/28/18  Yes Luking, Jonna Coup, MD  PROAIR RESPICLICK 108 (90 Base) MCG/ACT AEPB INHALE 1 PUFF INTO THE LUNGS EVERY 4 HOURS AS NEEDED. Patient taking differently: Inhale 1 puff into the lungs every 4 (four) hours as needed (for shortness of breath/wheezing).  03/24/18  Yes Lupita Leash, MD    Physical Exam:    Physical Exam: Vitals:   03/15/19 1504  BP: (!) 145/75  Pulse: 95  Resp: 15  Temp: 98 F (36.7 C)  TempSrc: Oral  SpO2: 95%    Constitutional: NAD, calm, comfortable Vitals:   03/15/19 1504  BP: (!) 145/75  Pulse: 95   Resp: 15  Temp: 98 F (36.7 C)  TempSrc: Oral  SpO2: 95%   Eyes: PERRL, lids and conjunctivae normal ENMT: Mucous membranes are moist. Posterior pharynx clear of any exudate or lesions.Normal dentition.  Neck: normal, supple, no masses, no thyromegaly Respiratory: clear to auscultation bilaterally, no wheezing, no crackles. Normal respiratory effort. No accessory muscle use.  Cardiovascular: Regular rate and rhythm, no murmurs / rubs / gallops. No extremity edema. 2+ pedal pulses. No carotid bruits.  Abdomen: healed laparotomy scars, +ve abdominal distention, generalized tenderness especially in the lower abdomen worse on the right Musculoskeletal: no clubbing / cyanosis. No joint deformity upper and lower extremities. Good ROM, no contractures. Normal muscle tone.  Skin: no rashes, lesions, ulcers. No induration Neurologic: CN 2-12 grossly intact. Sensation intact, DTR normal. Strength 5/5 in all 4.  Psychiatric: Normal judgment and insight. Alert and oriented x 3. Normal mood.    Decubitus Ulcers: Not present on admission Catheters and tubes: None  Data Review:    Labs on Admission: I have personally reviewed following labs and imaging studies  CBC: Recent Labs  Lab 03/09/19 0754 03/10/19 0625 03/12/19 0548 03/15/19 1551  WBC 5.4 4.7 8.6 10.9*  NEUTROABS  --  3.1 5.9  --   HGB 9.7* 9.3* 10.3* 10.1*  HCT 32.7* 30.8* 34.3* 32.9*  MCV 98.8 98.4 96.9 95.1  PLT 321 345 371 350   Basic Metabolic Panel: Recent Labs  Lab 03/09/19 0754 03/10/19 0625 03/12/19 0548 03/15/19 1551  NA 139 142 144 136  K 3.1* 3.3* 5.0 3.8  CL 102 105 108 101  CO2 22 24 22  19*  GLUCOSE 104* 93 101* 104*  BUN 46* 36* 22 57*  CREATININE 1.54* 1.46* 1.59* 2.31*  CALCIUM 7.2* 7.1* 8.2* 7.6*  MG  --  1.9  --   --    GFR: Estimated Creatinine Clearance: 13.9 mL/min (A) (by C-G formula based on SCr of 2.31 mg/dL (H)). Liver Function Tests: Recent Labs  Lab 03/10/19 0625 03/15/19 1551  AST  20 19  ALT 21 24  ALKPHOS 52 80  BILITOT 0.5 0.4  PROT 5.7* 6.3*  ALBUMIN 3.1* 3.4*   Recent Labs  Lab 03/15/19 1551  LIPASE 52*   No results for input(s): AMMONIA in the last 168 hours. Coagulation Profile: No results for input(s): INR, PROTIME in the last 168 hours. Cardiac Enzymes: No results for input(s): CKTOTAL, CKMB, CKMBINDEX, TROPONINI in the last 168  hours. BNP (last 3 results) No results for input(s): PROBNP in the last 8760 hours. HbA1C: No results for input(s): HGBA1C in the last 72 hours. CBG: No results for input(s): GLUCAP in the last 168 hours. Lipid Profile: No results for input(s): CHOL, HDL, LDLCALC, TRIG, CHOLHDL, LDLDIRECT in the last 72 hours. Thyroid Function Tests: No results for input(s): TSH, T4TOTAL, FREET4, T3FREE, THYROIDAB in the last 72 hours. Anemia Panel: No results for input(s): VITAMINB12, FOLATE, FERRITIN, TIBC, IRON, RETICCTPCT in the last 72 hours. Urine analysis:    Component Value Date/Time   COLORURINE YELLOW 03/15/2019 1640   APPEARANCEUR HAZY (A) 03/15/2019 1640   LABSPEC 1.015 03/15/2019 1640   PHURINE 5.0 03/15/2019 1640   GLUCOSEU NEGATIVE 03/15/2019 1640   HGBUR NEGATIVE 03/15/2019 1640   BILIRUBINUR NEGATIVE 03/15/2019 1640   KETONESUR NEGATIVE 03/15/2019 1640   PROTEINUR NEGATIVE 03/15/2019 1640   NITRITE NEGATIVE 03/15/2019 1640   LEUKOCYTESUR NEGATIVE 03/15/2019 1640     Imaging Results:      Radiological Exams on Admission: CT ABDOMEN PELVIS WO CONTRAST  Result Date: 03/15/2019 CLINICAL DATA:  Follow-up small-bowel obstruction EXAM: CT ABDOMEN AND PELVIS WITHOUT CONTRAST TECHNIQUE: Multidetector CT imaging of the abdomen and pelvis was performed following the standard protocol without IV contrast. COMPARISON:  03/08/2019 FINDINGS: Lower chest: No acute abnormality. Hepatobiliary: Gallbladder is partially contracted. Stable appearance of scattered low-density lesions throughout the liver including a subcapsular  lesion at the posterior aspect of the right hepatic lobe containing dystrophic calcification. No definite new hepatic lesion identified. No biliary dilatation. Pancreas: Unremarkable. No pancreatic ductal dilatation or surrounding inflammatory changes. Spleen: Normal in size without focal abnormality. Adrenals/Urinary Tract: Adrenal glands are unremarkable. Kidneys are within normal limits without focal lesion or hydronephrosis. There are renal vascular calcifications. Bladder is unremarkable. Stomach/Bowel: Persistent small bowel obstruction with transition point in the right hemipelvis (series 6, images 30-34). The degree of bowel dilatation is similar to the previous exam. By the colon remains largely collapsed. Stomach is mildly distended as well. Vascular/Lymphatic: Aortic atherosclerosis. No enlarged abdominal or pelvic lymph nodes. Reproductive: Status post hysterectomy. No adnexal masses. Other: No ascites. No pneumoperitoneum. No abdominal wall hernia. Musculoskeletal: No new or acute osseous findings. IMPRESSION: 1. Persistent small bowel obstruction with transition point in the right hemipelvis, likely secondary to adhesion. No free fluid or free air to suggest perforation. 2. Additional chronic findings, as above. Aortic Atherosclerosis (ICD10-I70.0). Electronically Signed   By: Duanne Guess D.O.   On: 03/15/2019 18:43      Gelisa Tieken Cyndie Chime MD [facility]  If 7PM-7AM, please contact night-coverage   03/15/2019, 8:15 PM

## 2019-03-15 NOTE — Telephone Encounter (Signed)
Patient called stating she felt like she may need to be in hospital. Patient states she has not been able to eat in days and is so weak she can barely lift her head and get dizzy if she sits up. Advised patient to call 911 and go straight to the hospital but she was hesitant and wanted advise from Dr Nicki Reaper. Dr Nicki Reaper spoke with patient and she agreed to go to hospital. 911 was called and information given for them to transport patient to hospital.

## 2019-03-16 DIAGNOSIS — K56609 Unspecified intestinal obstruction, unspecified as to partial versus complete obstruction: Secondary | ICD-10-CM

## 2019-03-16 LAB — CBC
HCT: 28.7 % — ABNORMAL LOW (ref 36.0–46.0)
Hemoglobin: 8.6 g/dL — ABNORMAL LOW (ref 12.0–15.0)
MCH: 29.3 pg (ref 26.0–34.0)
MCHC: 30 g/dL (ref 30.0–36.0)
MCV: 97.6 fL (ref 80.0–100.0)
Platelets: 246 K/uL (ref 150–400)
RBC: 2.94 MIL/uL — ABNORMAL LOW (ref 3.87–5.11)
RDW: 15.7 % — ABNORMAL HIGH (ref 11.5–15.5)
WBC: 9.6 K/uL (ref 4.0–10.5)
nRBC: 0 % (ref 0.0–0.2)

## 2019-03-16 LAB — COMPREHENSIVE METABOLIC PANEL
ALT: 20 U/L (ref 0–44)
AST: 16 U/L (ref 15–41)
Albumin: 2.7 g/dL — ABNORMAL LOW (ref 3.5–5.0)
Alkaline Phosphatase: 67 U/L (ref 38–126)
Anion gap: 10 (ref 5–15)
BUN: 52 mg/dL — ABNORMAL HIGH (ref 8–23)
CO2: 20 mmol/L — ABNORMAL LOW (ref 22–32)
Calcium: 6.8 mg/dL — ABNORMAL LOW (ref 8.9–10.3)
Chloride: 108 mmol/L (ref 98–111)
Creatinine, Ser: 2.09 mg/dL — ABNORMAL HIGH (ref 0.44–1.00)
GFR calc Af Amer: 24 mL/min — ABNORMAL LOW (ref >60)
GFR calc non Af Amer: 21 mL/min — ABNORMAL LOW (ref >60)
Glucose, Bld: 85 mg/dL (ref 70–99)
Potassium: 3.6 mmol/L (ref 3.5–5.1)
Sodium: 138 mmol/L (ref 135–145)
Total Bilirubin: 0.8 mg/dL (ref 0.3–1.2)
Total Protein: 5.3 g/dL — ABNORMAL LOW (ref 6.5–8.1)

## 2019-03-16 LAB — SARS CORONAVIRUS 2 (TAT 6-24 HRS): SARS Coronavirus 2: NEGATIVE

## 2019-03-16 LAB — CBG MONITORING, ED: Glucose-Capillary: 72 mg/dL (ref 70–99)

## 2019-03-16 MED ORDER — LABETALOL HCL 5 MG/ML IV SOLN: 10.0000 mg | INTRAVENOUS | Status: DC | PRN

## 2019-03-16 NOTE — ED Notes (Signed)
Small light stool noted this am

## 2019-03-16 NOTE — Consult Note (Signed)
Reason for Consult: Small bowel obstruction Referring Physician: Dr. Ara Kussmaul is an 86 y.o. female.  HPI: Patient is an 86 year old white female recently discharged from the hospital with a partial bowel obstruction most likely secondary to adhesive disease.  She is status post a right hemicolectomy for colon cancer in September of this year.  She is being followed by oncology.  She recently was discharged and states that she continued to have bowel movements, collection got weaker.  She was discharged to a skilled nursing facility.  She states they did not have room for her and then she might have ended up home.  She denies any fever or chills.  She is unsure when her last bowel movement is she was seen in the emergency room and was found on repeat CT scan to have a small bowel obstruction with adhesion in the lower pelvis.  An NG tube was placed and she states she is now passing gas.  She denies any abdominal pain.  Past Medical History:  Diagnosis Date  . Breast cancer ([place])   . Breast cancer, right breast ([place]) 05/01/2015  . Cancer (H[facility])    BREAST CANCER   . Colon polyps   . COPD (chronic obstructive pulmonary disease) ([place])    DR. [place] Bells  . Diabetes mellitus without complication ([place])    PREDIABETIC  CONTROL W/ DIET  . Hypercholesteremia   . Hypertension   . Lung nodules   . Osteoarthritis   . Osteopenia 09/18/2013  . Osteoporosis   . Personal history of radiation therapy   . PONV (postoperative nausea and vomiting)    ' i get so sick with anesthesia"   . Shortness of breath dyspnea    W/ EXERTION  . Tick bite    3 years ago     Past Surgical History:  Procedure Laterality Date  . ABDOMINAL HYSTERECTOMY    . APPENDECTOMY    . BARTHOLIN GLAND CYST EXCISION    . BIOPSY  12/06/2018   Procedure: BIOPSY;  Surgeon: Rogene Houston, MD;  [facility];  Service: Endoscopy;;  cecal mass biopsies   . BREAST EXCISIONAL BIOPSY Left    benign  . BREAST  LUMPECTOMY WITH RADIOACTIVE SEED LOCALIZATION Right 05/23/2015   Procedure: RIGHT BREAST LUMPECTOMY WITH RADIOACTIVE SEED LOCALIZATION;  Surgeon: Fanny Skates, MD;  Location: [facility];  Service: General;  Laterality: Right;  . BREAST SURGERY     2000  LEFT BREAST  LUMP REMOVED  . cataract surgeries  Bilateral   . COLON RESECTION N/A 12/08/2018   Procedure: OPEN SEGMENTAL COLON RESECTION;  Surgeon: Fanny Skates, MD;  [facility];  Service: General;  Laterality: N/A;  . colonoscopy    . COLONOSCOPY  03/25/2011   Procedure: COLONOSCOPY;  Surgeon: Rogene Houston, MD;  [facility];  Service: Endoscopy;  Laterality: N/A;  9:30 am  . COLONOSCOPY N/A 12/06/2018   Procedure: COLONOSCOPY;  Surgeon: Rogene Houston, MD;  [facility];  Service: Endoscopy;  Laterality: N/A;  . CYSTOSCOPY W/ URETERAL STENT PLACEMENT Left 02/27/2018   Procedure: CYSTOSCOPY WITH LEFT RETROGRADE PYELOGRAM AND lEFT URETEROSCOPY WITH BIOPSY AND LEFT URETERAL STENT PLACEMENT;  Surgeon: Cleon Gustin, MD;  Location[facility];  Service: Urology;  Laterality: Left;  . CYSTOSCOPY W/ URETERAL STENT PLACEMENT Right 12/08/2018   Procedure: CYSTOSCOPY WITH RETROGRADE PYELOGRAM/URETERAL STENT PLACEMENT;  Surgeon: Raynelle Bring, MD;  [facility];  Service: Urology;  Laterality: Right;  .  left inguinal hernia repair    . multiple toe surgeries    . ROBOT ASSITED LAPAROSCOPIC NEPHROURETERECTOMY Left 05/11/2018   Procedure: XI ROBOT ASSITED LAPAROSCOPIC LEFT DISTAL URETERECTOMY, URETERAL  REIMPLANTATION, WITH PSOAS HITCH AND BOARI FLAP. PELVIC LYMPH NODE DISSCETION AND LEFT URETERAL STENT PLACEMENT;  Surgeon: Cleon Gustin, MD;  [facility];  Service: Urology;  Laterality: Left;  . TONSILLECTOMY      Family History  Problem Relation Age of Onset  . Colon cancer Father     Social History:  reports that she quit smoking about 31 years ago. Her smoking use included cigarettes. She has a 100.00  pack-year smoking history. She has never used smokeless tobacco. She reports current alcohol use. She reports that she does not use drugs.  Allergies:  Allergies  Allergen Reactions  . Augmentin [Amoxicillin-Pot Clavulanate] Nausea And Vomiting    Has patient had a PCN reaction causing immediate rash, facial/tongue/throat swelling, SOB or lightheadedness with hypotension: No Has patient had a PCN reaction causing severe rash involving mucus membranes or skin necrosis: No Has patient had a PCN reaction that required hospitalization: No Has patient had a PCN reaction occurring within the last 10 years: Unknown If all of the above answers are "NO", then may proceed with Cephalosporin use.    Marland Kitchen Amoxicillin Diarrhea    Has patient had a PCN reaction causing immediate rash, facial/tongue/throat swelling, SOB or lightheadedness with hypotension: No Has patient had a PCN reaction causing severe rash involving mucus membranes or skin necrosis: No Has patient had a PCN reaction that required hospitalization: No Has patient had a PCN reaction occurring within the last 10 years: Unknown If all of the above answers are "NO", then may proceed with Cephalosporin use.   . Codeine Nausea And Vomiting  . Morphine Nausea And Vomiting  . Neomycin-Bacitracin Zn-Polymyx Itching and Rash    Unable to use Neosporin    Medications: I have reviewed the patient's current medications.  Results for orders placed or performed during the hospital encounter of 03/15/19 (from the past 48 hour(s))  Lipase, blood     Status: Abnormal   Collection Time: 03/15/19  3:51 PM  Result Value Ref Range   Lipase 52 (H) 11 - 51 U/L    Comment: Performed at [facility], [address] [place], [place] [address]  Comprehensive metabolic panel     Status: Abnormal   Collection Time: 03/15/19  3:51 PM  Result Value Ref Range   Sodium 136 135 - 145 mmol/L   Potassium 3.8 3.5 - 5.1 mmol/L   Chloride 101 98 - 111 mmol/L   CO2  19 (L) 22 - 32 mmol/L   Glucose, Bld 104 (H) 70 - 99 mg/dL   BUN 57 (H) 8 - 23 mg/dL   Creatinine, Ser 2.31 (H) 0.44 - 1.00 mg/dL   Calcium 7.6 (L) 8.9 - 10.3 mg/dL   Total Protein 6.3 (L) 6.5 - 8.1 g/dL   Albumin 3.4 (L) 3.5 - 5.0 g/dL   AST 19 15 - 41 U/L   ALT 24 0 - 44 U/L   Alkaline Phosphatase 80 38 - 126 U/L   Total Bilirubin 0.4 0.3 - 1.2 mg/dL   GFR calc non Af Amer 18 (L) >60 mL/min   GFR calc Af Amer 21 (L) >60 mL/min   Anion gap 16 (H) 5 - 15    Comment: Performed at [facility], [address], [place], [place] [address]  CBC  Status: Abnormal   Collection Time: 03/15/19  3:51 PM  Result Value Ref Range   WBC 10.9 (H) 4.0 - 10.5 K/uL   RBC 3.46 (L) 3.87 - 5.11 MIL/uL   Hemoglobin 10.1 (L) 12.0 - 15.0 g/dL   HCT 32.9 (L) 36.0 - 46.0 %   MCV 95.1 80.0 - 100.0 fL   MCH 29.2 26.0 - 34.0 pg   MCHC 30.7 30.0 - 36.0 g/dL   RDW 15.6 (H) 11.5 - 15.5 %   Platelets 350 150 - 400 K/uL   nRBC 0.0 0.0 - 0.2 %    Comment: Performed at [place] [facility], [address], [place], [place] [address]  Urinalysis, Routine w reflex microscopic     Status: Abnormal   Collection Time: 03/15/19  4:40 PM  Result Value Ref Range   Color, Urine YELLOW YELLOW   APPearance HAZY (A) CLEAR   Specific Gravity, Urine 1.015 1.005 - 1.030   pH 5.0 5.0 - 8.0   Glucose, UA NEGATIVE NEGATIVE mg/dL   Hgb urine dipstick NEGATIVE NEGATIVE   Bilirubin Urine NEGATIVE NEGATIVE   Ketones, ur NEGATIVE NEGATIVE mg/dL   Protein, ur NEGATIVE NEGATIVE mg/dL   Nitrite NEGATIVE NEGATIVE   Leukocytes,Ua NEGATIVE NEGATIVE    Comment: Performed at [facility], [address], [place], [place] [address]  CBC     Status: Abnormal   Collection Time: 03/16/19  4:20 AM  Result Value Ref Range   WBC 9.6 4.0 - 10.5 K/uL   RBC 2.94 (L) 3.87 - 5.11 MIL/uL   Hemoglobin 8.6 (L) 12.0 - 15.0 g/dL   HCT 28.7 (L) 36.0 - 46.0 %   MCV 97.6 80.0 - 100.0 fL   MCH 29.3 26.0 - 34.0 pg   MCHC 30.0 30.0 - 36.0 g/dL   RDW 15.7 (H)  11.5 - 15.5 %   Platelets 246 150 - 400 K/uL   nRBC 0.0 0.0 - 0.2 %    Comment: Performed at [facility], [address] [place], [place] [address]  Comprehensive metabolic panel     Status: Abnormal   Collection Time: 03/16/19  4:20 AM  Result Value Ref Range   Sodium 138 135 - 145 mmol/L   Potassium 3.6 3.5 - 5.1 mmol/L   Chloride 108 98 - 111 mmol/L   CO2 20 (L) 22 - 32 mmol/L   Glucose, Bld 85 70 - 99 mg/dL   BUN 52 (H) 8 - 23 mg/dL   Creatinine, Ser 2.09 (H) 0.44 - 1.00 mg/dL   Calcium 6.8 (L) 8.9 - 10.3 mg/dL   Total Protein 5.3 (L) 6.5 - 8.1 g/dL   Albumin 2.7 (L) 3.5 - 5.0 g/dL   AST 16 15 - 41 U/L   ALT 20 0 - 44 U/L   Alkaline Phosphatase 67 38 - 126 U/L   Total Bilirubin 0.8 0.3 - 1.2 mg/dL   GFR calc non Af Amer 21 (L) >60 mL/min   GFR calc Af Amer 24 (L) >60 mL/min   Anion gap 10 5 - 15    Comment: Performed at [facility], [address] [place], [place] [address]  CBG monitoring, ED     Status: None   Collection Time: 03/16/19  8:27 AM  Result Value Ref Range   Glucose-Capillary 72 70 - 99 mg/dL    CT ABDOMEN PELVIS WO CONTRAST  Result Date: 03/15/2019 CLINICAL DATA:  Follow-up small-bowel obstruction EXAM: CT ABDOMEN AND PELVIS WITHOUT CONTRAST TECHNIQUE: Multidetector CT imaging of the  abdomen and pelvis was performed following the standard protocol without IV contrast. COMPARISON:  03/08/2019 FINDINGS: Lower chest: No acute abnormality. Hepatobiliary: Gallbladder is partially contracted. Stable appearance of scattered low-density lesions throughout the liver including a subcapsular lesion at the posterior aspect of the right hepatic lobe containing dystrophic calcification. No definite new hepatic lesion identified. No biliary dilatation. Pancreas: Unremarkable. No pancreatic ductal dilatation or surrounding inflammatory changes. Spleen: Normal in size without focal abnormality. Adrenals/Urinary Tract: Adrenal glands are unremarkable. Kidneys are within normal  limits without focal lesion or hydronephrosis. There are renal vascular calcifications. Bladder is unremarkable. Stomach/Bowel: Persistent small bowel obstruction with transition point in the right hemipelvis (series 6, images 30-34). The degree of bowel dilatation is similar to the previous exam. By the colon remains largely collapsed. Stomach is mildly distended as well. Vascular/Lymphatic: Aortic atherosclerosis. No enlarged abdominal or pelvic lymph nodes. Reproductive: Status post hysterectomy. No adnexal masses. Other: No ascites. No pneumoperitoneum. No abdominal wall hernia. Musculoskeletal: No new or acute osseous findings. IMPRESSION: 1. Persistent small bowel obstruction with transition point in the right hemipelvis, likely secondary to adhesion. No free fluid or free air to suggest perforation. 2. Additional chronic findings, as above. Aortic Atherosclerosis (ICD10-I70.0). Electronically Signed   By: Davina Poke D.O.   On: 03/15/2019 18:43   DG Chest Portable 1 View  Result Date: 03/15/2019 CLINICAL DATA:  NG tube placement EXAM: PORTABLE CHEST 1 VIEW COMPARISON:  03/08/2019 FINDINGS: Suspected skin fold artifact over the right upper thorax. Clips at the right chest. No acute consolidation, pleural effusion or pneumothorax. Stable cardiomediastinal silhouette with aortic atherosclerosis. Esophageal tube tip is below the diaphragm but incompletely visualized. IMPRESSION: 1. Esophageal tube tip below the diaphragm but incompletely visualized. 2. No acute airspace disease. Electronically Signed   By: Donavan Foil M.D.   On: 03/15/2019 21:53    ROS:  Pertinent items are noted in HPI.  Blood pressure 122/62, pulse 81, temperature 98 F (36.7 C), temperature source Oral, resp. rate 16, SpO2 100 %. Physical Exam: Fatigued elderly white female no acute distress Head is normocephalic, atraumatic Lungs clear to auscultation with good breath sounds bilaterally Heart examination reveals regular  rate and rhythm without S3, S4, murmurs Abdomen is soft but distended.  There are bowel sounds appreciated.  They are not hyperactive.  No rigidity is noted.  CT scan images personally reviewed  Assessment/Plan: Impression: Partial small bowel obstruction secondary to adhesive disease, acute kidney injury, deconditioning Plan: Continue NG tube decompression for now.  May need small bowel series in the next few days.  I did discuss with the patient the possibility of needing surgical exploration, but she would like to have surgery down at [facility] health with [facility] if it comes to that.  She does not need acute surgical invention at this time.  Aviva Signs 03/16/2019, 8:59 AM

## 2019-03-16 NOTE — ED Notes (Signed)
Ambulated to Br with one person assist.  Tolerated well/gait steady.

## 2019-03-16 NOTE — Progress Notes (Signed)
PROGRESS NOTE    [name]  MWU:[id number] DOB: Mar 22, 1928 DOA: 03/15/2019 PCP: Babs Sciara, MD   Brief Narrative:  Per HPI: [name] is a 86 y.o. female with medical history significant of cecal adenocarcinoma, hypertension, urothelial carcinoma, breast cancer, COPD, prediabetes who presented to the ER with abdominal pain.  Patient was recently admitted here with a partial small bowel obstruction from 03/08/2019-03/12/2019.  She was discharged after tolerating oral feeds and having multiple bowel movements.  She states she has had recurrent symptoms in the last 24 hours with increased abdominal pain, distention, nausea, reduced appetite.  Unable to tolerate anything p.o. in the last 24 hours.  Feels really weak and dehydrated.  Contacted her PCP who recommended coming back to the ER.  Lives alone and is usually independent at baseline.  Currently having difficulty caring for herself because of weakness.  1/1: Patient was admitted with partial small bowel obstruction secondary to adhesive disease along with AKI on CKD stage III and deconditioning in the setting of cecal adenocarcinoma.  She remains on NG tube to low intermittent suction with IV fluid hydration.  She has been seen by general surgery with plans to monitor conservatively for now with small bowel follow-through in the next 1-2 days.  No need for operative intervention at this time.  Assessment & Plan:   Principal Problem:   SBO (small bowel obstruction) (HCC) Active Problems:   Essential hypertension, benign   Carotid artery disease (HCC)   Hyperlipidemia   Peripheral neuropathy   Breast cancer, right breast (HCC)   COPD (chronic obstructive pulmonary disease) (HCC)   Chronic respiratory failure with hypoxia (HCC)   Bladder cancer (HCC)   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   Partial small bowel obstruction likely sec (baseline ondary to adhesions -Status post recent proximal right  colectomy on 11/2018 for cecal malignancy -Continue NG tube for decompression -Continue IV Dilaudid as needed for pain control and Zofran as needed for nausea or vomiting -Appreciate ongoing general surgery consultation; patient does state that she would like to go to [place] with operative intervention as needed -Continue n.p.o. and IV fluid hydration -Monitor electrolytes  AKI on CKD stage III (baseline creatinine 1.5) -Appears to be improving and I will plan to continue IV fluid -Continue to monitor a.m. labs  Hypertension-stable -Hold amlodipine for now with labetalol as needed for.  Cecal adenocarcinoma stage II -Prior right hemicolectomy 12/08/2018 -Follows with oncology Dr. Ellin Saba and apparently patient does not require any adjuvant chemotherapy  Urothelial carcinoma-high-grade to left distal ureter -Follows with urology  History of stage I right breast cancer -Continue to follow-up with oncology  DVT prophylaxis: Heparin Code Status: DNR Family Communication: None at bedside Disposition Plan: Continue conservative management of SBO with plans for small bowel follow-through in the near future.   Consultants:   General surgery  Procedures:   None  Antimicrobials:   None   Subjective: Patient seen and evaluated today with no new acute complaints or concerns. No acute concerns or events noted overnight.  She states that she is feeling better with the use of NG tube to low intermittent suction.  She denies any nausea or vomiting or abdominal pain.  She states that she is passing some flatus.  Objective: Vitals:   03/16/19 0730 03/16/19 0745 03/16/19 0830 03/16/19 0900  BP: (!) 130/58  127/61 (!) 122/54  Pulse:  80 77 74  Resp:   16   Temp:   98.2  F (36.8 C)   TempSrc:   Oral   SpO2:  100% 96% 99%   No intake or output data in the 24 hours ending 03/16/19 0956 There were no vitals filed for this visit.  Examination:  General exam: Appears calm and  comfortable  Respiratory system: Clear to auscultation. Respiratory effort normal.  Currently on room air. Cardiovascular system: S1 & S2 heard, RRR. No JVD, murmurs, rubs, gallops or clicks. No pedal edema. Gastrointestinal system: Abdomen is minimally distended, soft and nontender. No organomegaly or masses felt. Normal bowel sounds heard.  NG tube to low intermittent suction noted. Central nervous system: Alert and oriented. No focal neurological deficits. Extremities: Symmetric 5 x 5 power. Skin: No rashes, lesions or ulcers Psychiatry: Judgement and insight appear normal. Mood & affect appropriate.     Data Reviewed: I have personally reviewed following labs and imaging studies  CBC: Recent Labs  Lab 03/10/19 0625 03/12/19 0548 03/15/19 1551 03/16/19 0420  WBC 4.7 8.6 10.9* 9.6  NEUTROABS 3.1 5.9  --   --   HGB 9.3* 10.3* 10.1* 8.6*  HCT 30.8* 34.3* 32.9* 28.7*  MCV 98.4 96.9 95.1 97.6  PLT 345 371 350 246   Basic Metabolic Panel: Recent Labs  Lab 03/10/19 0625 03/12/19 0548 03/15/19 1551 03/16/19 0420  NA 142 144 136 138  K 3.3* 5.0 3.8 3.6  CL 105 108 101 108  CO2 24 22 19* 20*  GLUCOSE 93 101* 104* 85  BUN 36* 22 57* 52*  CREATININE 1.46* 1.59* 2.31* 2.09*  CALCIUM 7.1* 8.2* 7.6* 6.8*  MG 1.9  --   --   --    GFR: Estimated Creatinine Clearance: 15.4 mL/min (A) (by C-G formula based on SCr of 2.09 mg/dL (H)). Liver Function Tests: Recent Labs  Lab 03/10/19 0625 03/15/19 1551 03/16/19 0420  AST 20 19 16   ALT 21 24 20   ALKPHOS 52 80 67  BILITOT 0.5 0.4 0.8  PROT 5.7* 6.3* 5.3*  ALBUMIN 3.1* 3.4* 2.7*   Recent Labs  Lab 03/15/19 1551  LIPASE 52*   No results for input(s): AMMONIA in the last 168 hours. Coagulation Profile: No results for input(s): INR, PROTIME in the last 168 hours. Cardiac Enzymes: No results for input(s): CKTOTAL, CKMB, CKMBINDEX, TROPONINI in the last 168 hours. BNP (last 3 results) No results for input(s): PROBNP in the  last 8760 hours. HbA1C: No results for input(s): HGBA1C in the last 72 hours. CBG: Recent Labs  Lab 03/16/19 0827  GLUCAP 72   Lipid Profile: No results for input(s): CHOL, HDL, LDLCALC, TRIG, CHOLHDL, LDLDIRECT in the last 72 hours. Thyroid Function Tests: No results for input(s): TSH, T4TOTAL, FREET4, T3FREE, THYROIDAB in the last 72 hours. Anemia Panel: No results for input(s): VITAMINB12, FOLATE, FERRITIN, TIBC, IRON, RETICCTPCT in the last 72 hours. Sepsis Labs: No results for input(s): PROCALCITON, LATICACIDVEN in the last 168 hours.  No results found for this or any previous visit (from the past 240 hour(s)).       Radiology Studies: CT ABDOMEN PELVIS WO CONTRAST  Result Date: 03/15/2019 CLINICAL DATA:  Follow-up small-bowel obstruction EXAM: CT ABDOMEN AND PELVIS WITHOUT CONTRAST TECHNIQUE: Multidetector CT imaging of the abdomen and pelvis was performed following the standard protocol without IV contrast. COMPARISON:  03/08/2019 FINDINGS: Lower chest: No acute abnormality. Hepatobiliary: Gallbladder is partially contracted. Stable appearance of scattered low-density lesions throughout the liver including a subcapsular lesion at the posterior aspect of the right hepatic lobe containing dystrophic calcification.  No definite new hepatic lesion identified. No biliary dilatation. Pancreas: Unremarkable. No pancreatic ductal dilatation or surrounding inflammatory changes. Spleen: Normal in size without focal abnormality. Adrenals/Urinary Tract: Adrenal glands are unremarkable. Kidneys are within normal limits without focal lesion or hydronephrosis. There are renal vascular calcifications. Bladder is unremarkable. Stomach/Bowel: Persistent small bowel obstruction with transition point in the right hemipelvis (series 6, images 30-34). The degree of bowel dilatation is similar to the previous exam. By the colon remains largely collapsed. Stomach is mildly distended as well.  Vascular/Lymphatic: Aortic atherosclerosis. No enlarged abdominal or pelvic lymph nodes. Reproductive: Status post hysterectomy. No adnexal masses. Other: No ascites. No pneumoperitoneum. No abdominal wall hernia. Musculoskeletal: No new or acute osseous findings. IMPRESSION: 1. Persistent small bowel obstruction with transition point in the right hemipelvis, likely secondary to adhesion. No free fluid or free air to suggest perforation. 2. Additional chronic findings, as above. Aortic Atherosclerosis (ICD10-I70.0). Electronically Signed   By: Duanne Guess D.O.   On: 03/15/2019 18:43   DG Chest Portable 1 View  Result Date: 03/15/2019 CLINICAL DATA:  NG tube placement EXAM: PORTABLE CHEST 1 VIEW COMPARISON:  03/08/2019 FINDINGS: Suspected skin fold artifact over the right upper thorax. Clips at the right chest. No acute consolidation, pleural effusion or pneumothorax. Stable cardiomediastinal silhouette with aortic atherosclerosis. Esophageal tube tip is below the diaphragm but incompletely visualized. IMPRESSION: 1. Esophageal tube tip below the diaphragm but incompletely visualized. 2. No acute airspace disease. Electronically Signed   By: Jasmine Pang M.D.   On: 03/15/2019 21:53        Scheduled Meds: . heparin  5,000 Units Subcutaneous Q8H  . sodium chloride flush  3 mL Intravenous Q12H   Continuous Infusions: . sodium chloride    . lactated ringers 125 mL/hr at 03/16/19 0526     LOS: 1 day    Time spent: 30 minutes    Alveena Taira Hoover Brunette, DO [facility] Pager [phone]  If 7PM-7AM, please contact night-coverage [email or web address] Password [facility] 03/16/2019, 9:56 AM

## 2019-03-17 LAB — COMPREHENSIVE METABOLIC PANEL
ALT: 20 U/L (ref 0–44)
AST: 18 U/L (ref 15–41)
Albumin: 2.5 g/dL — ABNORMAL LOW (ref 3.5–5.0)
Alkaline Phosphatase: 62 U/L (ref 38–126)
Anion gap: 19 — ABNORMAL HIGH (ref 5–15)
BUN: 38 mg/dL — ABNORMAL HIGH (ref 8–23)
CO2: 24 mmol/L (ref 22–32)
Calcium: 7.5 mg/dL — ABNORMAL LOW (ref 8.9–10.3)
Chloride: 105 mmol/L (ref 98–111)
Creatinine, Ser: 1.63 mg/dL — ABNORMAL HIGH (ref 0.44–1.00)
GFR calc Af Amer: 32 mL/min — ABNORMAL LOW (ref >60)
GFR calc non Af Amer: 28 mL/min — ABNORMAL LOW (ref >60)
Glucose, Bld: 79 mg/dL (ref 70–99)
Potassium: 3 mmol/L — ABNORMAL LOW (ref 3.5–5.1)
Sodium: 148 mmol/L — ABNORMAL HIGH (ref 135–145)
Total Bilirubin: 1 mg/dL (ref 0.3–1.2)
Total Protein: 4.9 g/dL — ABNORMAL LOW (ref 6.5–8.1)

## 2019-03-17 LAB — MAGNESIUM: Magnesium: 1.6 mg/dL — ABNORMAL LOW (ref 1.7–2.4)

## 2019-03-17 LAB — CBC
HCT: 29 % — ABNORMAL LOW (ref 36.0–46.0)
Hemoglobin: 9 g/dL — ABNORMAL LOW (ref 12.0–15.0)
MCH: 29.4 pg (ref 26.0–34.0)
MCHC: 31 g/dL (ref 30.0–36.0)
MCV: 94.8 fL (ref 80.0–100.0)
Platelets: 278 10*3/uL (ref 150–400)
RBC: 3.06 MIL/uL — ABNORMAL LOW (ref 3.87–5.11)
RDW: 15.5 % (ref 11.5–15.5)
WBC: 8.9 10*3/uL (ref 4.0–10.5)
nRBC: 0 % (ref 0.0–0.2)

## 2019-03-17 MED ORDER — MAGNESIUM SULFATE 2 GM/50ML IV SOLN
2.0000 g | Freq: Once | INTRAVENOUS | Status: AC
  Administered 2019-03-17: 18:00:00 2 g via INTRAVENOUS
  Filled 2019-03-17: qty 50

## 2019-03-17 MED ORDER — SODIUM CHLORIDE 0.45 % IV SOLN: INTRAVENOUS | Status: DC

## 2019-03-17 MED ORDER — POTASSIUM CHLORIDE 10 MEQ/100ML IV SOLN
10.0000 meq | INTRAVENOUS | Status: AC
  Administered 2019-03-17 (×6): 10 meq via INTRAVENOUS
  Filled 2019-03-17: qty 100

## 2019-03-17 NOTE — Progress Notes (Signed)
Subjective: Patient denies any abdominal pain.  She states she did have 2 bowel movements.  Objective: Vital signs in last 24 hours: Temp:  [97.5 F (36.4 C)-97.8 F (36.6 C)] 97.5 F (36.4 C) (01/02 3149) Pulse Rate:  [75-91] 91 (01/02 0638) Resp:  [16-18] 18 (01/01 2201) BP: (121-157)/(47-81) 157/81 (01/02 0638) SpO2:  [97 %-100 %] 97 % (01/02 7026) Weight:  [50.5 kg] 50.5 kg (01/02 0500) Last BM Date: 03/16/19  Intake/Output from previous day: 01/01 0701 - 01/02 0700 In: 2022.5 [I.V.:2022.5] Out: 1800 [Emesis/NG output:1800] Intake/Output this shift: No intake/output data recorded.  General appearance: alert, cooperative, appears stated age and no distress GI: soft, non-tender; bowel sounds normal; no masses,  no organomegaly  Lab Results:  Recent Labs    03/16/19 0420 03/17/19 0559  WBC 9.6 8.9  HGB 8.6* 9.0*  HCT 28.7* 29.0*  PLT 246 278   BMET Recent Labs    03/16/19 0420 03/17/19 0559  NA 138 148*  K 3.6 3.0*  CL 108 105  CO2 20* 24  GLUCOSE 85 79  BUN 52* 38*  CREATININE 2.09* 1.63*  CALCIUM 6.8* 7.5*   PT/INR No results for input(s): LABPROT, INR in the last 72 hours.  Studies/Results: CT ABDOMEN PELVIS WO CONTRAST  Result Date: 03/15/2019 CLINICAL DATA:  Follow-up small-bowel obstruction EXAM: CT ABDOMEN AND PELVIS WITHOUT CONTRAST TECHNIQUE: Multidetector CT imaging of the abdomen and pelvis was performed following the standard protocol without IV contrast. COMPARISON:  03/08/2019 FINDINGS: Lower chest: No acute abnormality. Hepatobiliary: Gallbladder is partially contracted. Stable appearance of scattered low-density lesions throughout the liver including a subcapsular lesion at the posterior aspect of the right hepatic lobe containing dystrophic calcification. No definite new hepatic lesion identified. No biliary dilatation. Pancreas: Unremarkable. No pancreatic ductal dilatation or surrounding inflammatory changes. Spleen: Normal in size  without focal abnormality. Adrenals/Urinary Tract: Adrenal glands are unremarkable. Kidneys are within normal limits without focal lesion or hydronephrosis. There are renal vascular calcifications. Bladder is unremarkable. Stomach/Bowel: Persistent small bowel obstruction with transition point in the right hemipelvis (series 6, images 30-34). The degree of bowel dilatation is similar to the previous exam. By the colon remains largely collapsed. Stomach is mildly distended as well. Vascular/Lymphatic: Aortic atherosclerosis. No enlarged abdominal or pelvic lymph nodes. Reproductive: Status post hysterectomy. No adnexal masses. Other: No ascites. No pneumoperitoneum. No abdominal wall hernia. Musculoskeletal: No new or acute osseous findings. IMPRESSION: 1. Persistent small bowel obstruction with transition point in the right hemipelvis, likely secondary to adhesion. No free fluid or free air to suggest perforation. 2. Additional chronic findings, as above. Aortic Atherosclerosis (ICD10-I70.0). Electronically Signed   By: Davina Poke D.O.   On: 03/15/2019 18:43   DG Chest Portable 1 View  Result Date: 03/15/2019 CLINICAL DATA:  NG tube placement EXAM: PORTABLE CHEST 1 VIEW COMPARISON:  03/08/2019 FINDINGS: Suspected skin fold artifact over the right upper thorax. Clips at the right chest. No acute consolidation, pleural effusion or pneumothorax. Stable cardiomediastinal silhouette with aortic atherosclerosis. Esophageal tube tip is below the diaphragm but incompletely visualized. IMPRESSION: 1. Esophageal tube tip below the diaphragm but incompletely visualized. 2. No acute airspace disease. Electronically Signed   By: Donavan Foil M.D.   On: 03/15/2019 21:53    Anti-infectives: Anti-infectives (From admission, onward)   None      Assessment/Plan: Impression: Small bowel obstruction, resolving.  Patient still has significant NG tube output.  She also has hypokalemia and hypomagnesemia.  I have  addressed the low  magnesium level.  Would like to keep the NG tube in for another 24 hours.  Patient understands and agrees.  No need for surgical intervention at this time.  LOS: 2 days    Aviva Signs 03/17/2019

## 2019-03-17 NOTE — Progress Notes (Signed)
PROGRESS NOTE    [name]  WJX:[id number] DOB: Apr 25, 1928 DOA: 03/15/2019 PCP: Lauren Sciara, MD   Brief Narrative:  Per HPI: [name] a 86 y.o.femalewith medical history significant ofcecal adenocarcinoma, hypertension, urothelial carcinoma, breast cancer, COPD, prediabetes who presented to the ER with abdominal pain. Patient was recently admitted here with a partial small bowel obstruction from 03/08/2019-03/12/2019. She was discharged after tolerating oral feeds and having multiple bowel movements. She states she has had recurrent symptoms in the last 24 hours with increased abdominal pain, distention, nausea, reduced appetite. Unable to tolerate anything p.o. in the last 24 hours. Feels really weak and dehydrated. Contacted her PCP who recommended coming back to the ER. Lives alone and is usually independent at baseline. Currently having difficulty caring for herself because of weakness.  1/1: Patient was admitted with partial small bowel obstruction secondary to adhesive disease along with AKI on CKD stage III and deconditioning in the setting of cecal adenocarcinoma.  She remains on NG tube to low intermittent suction with IV fluid hydration.  She has been seen by general surgery with plans to monitor conservatively for now with small bowel follow-through in the next 1-2 days.  No need for operative intervention at this time.  1/2: Noted to have some hypomagnesemia and hypokalemia this morning which will be repleted.  Otherwise doing well and passing flatus with 2 bowel movements.  Continue NG tube to suction with high output still noted per general surgery.  Assessment & Plan:   Principal Problem:   SBO (small bowel obstruction) (HCC) Active Problems:   Essential hypertension, benign   Carotid artery disease (HCC)   Hyperlipidemia   Peripheral neuropathy   Breast cancer, right breast (HCC)   COPD (chronic obstructive pulmonary disease) (HCC)  Chronic respiratory failure with hypoxia (HCC)   Bladder cancer (HCC)   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   Partial small bowel obstruction likely sec (baseline ondary to adhesions -Status post recent proximal right colectomy on 11/2018 for cecal malignancy -Continue NG tube for decompression -Continue IV Dilaudid as needed for pain control and Zofran as needed for nausea or vomiting -Appreciate ongoing general surgery consultation; patient does state that she would like to go to [place] with operative intervention as needed -Continue n.p.o. and IV fluid hydration -Monitor electrolytes  Hypomagnesemia/hypokalemia -Being repleted and will reevaluate in a.m.  Hypernatremia -Switch IV fluid to half-normal saline and monitor  AKI on CKD stage III (baseline creatinine 1.5)-improving -Appears to be improving and I will plan to continue IV fluid -Continue to monitor a.m. labs  Hypertension-stable -Hold amlodipine for now with labetalol as needed for elevations  Cecal adenocarcinoma stage II -Prior right hemicolectomy 12/08/2018 -Follows with oncology Dr. Ellin Saba and apparently patient does not require any adjuvant chemotherapy  Urothelial carcinoma-high-grade to left distal ureter -Follows with urology  History of stage I right breast cancer -Continue to follow-up with oncology  DVT prophylaxis: Heparin Code Status: DNR Family Communication: None at bedside Disposition Plan: Continue conservative management of SBO with plans for small bowel follow-through in the near future.   Consultants:   General surgery  Procedures:   None  Antimicrobials:   None  Subjective: Patient seen and evaluated today with no new acute complaints or concerns. No acute concerns or events noted overnight.  She is passing flatus and has had 2 small bowel movements.  Still has ongoing high output through NG tube.  Objective: Vitals:   03/16/19 1100 03/16/19  2201 03/17/19 0500 03/17/19 0638  BP: (!) 121/47 134/64  (!) 157/81  Pulse: 75 80  91  Resp:  18    Temp:  97.8 F (36.6 C)  (!) 97.5 F (36.4 C)  TempSrc:  Oral  Oral  SpO2: 100% 97%  97%  Weight:   50.5 kg     Intake/Output Summary (Last 24 hours) at 03/17/2019 1103 Last data filed at 03/17/2019 0020 Gross per 24 hour  Intake 2022.51 ml  Output 1800 ml  Net 222.51 ml   Filed Weights   03/17/19 0500  Weight: 50.5 kg    Examination:  General exam: Appears calm and comfortable  Respiratory system: Clear to auscultation. Respiratory effort normal. Cardiovascular system: S1 & S2 heard, RRR. No JVD, murmurs, rubs, gallops or clicks. No pedal edema. Gastrointestinal system: Abdomen is nondistended, soft and nontender. No organomegaly or masses felt. Normal bowel sounds heard.  NG tube with high output. Central nervous system: Alert and oriented. No focal neurological deficits. Extremities: Symmetric 5 x 5 power. Skin: No rashes, lesions or ulcers Psychiatry: Judgement and insight appear normal. Mood & affect appropriate.     Data Reviewed: I have personally reviewed following labs and imaging studies  CBC: Recent Labs  Lab 03/12/19 0548 03/15/19 1551 03/16/19 0420 03/17/19 0559  WBC 8.6 10.9* 9.6 8.9  NEUTROABS 5.9  --   --   --   HGB 10.3* 10.1* 8.6* 9.0*  HCT 34.3* 32.9* 28.7* 29.0*  MCV 96.9 95.1 97.6 94.8  PLT 371 350 246 278   Basic Metabolic Panel: Recent Labs  Lab 03/12/19 0548 03/15/19 1551 03/16/19 0420 03/17/19 0559  NA 144 136 138 148*  K 5.0 3.8 3.6 3.0*  CL 108 101 108 105  CO2 22 19* 20* 24  GLUCOSE 101* 104* 85 79  BUN 22 57* 52* 38*  CREATININE 1.59* 2.31* 2.09* 1.63*  CALCIUM 8.2* 7.6* 6.8* 7.5*  MG  --   --   --  1.6*   GFR: Estimated Creatinine Clearance: 19 mL/min (A) (by C-G formula based on SCr of 1.63 mg/dL (H)). Liver Function Tests: Recent Labs  Lab 03/15/19 1551 03/16/19 0420 03/17/19 0559  AST 19 16 18   ALT 24 20 20     ALKPHOS 80 67 62  BILITOT 0.4 0.8 1.0  PROT 6.3* 5.3* 4.9*  ALBUMIN 3.4* 2.7* 2.5*   Recent Labs  Lab 03/15/19 1551  LIPASE 52*   No results for input(s): AMMONIA in the last 168 hours. Coagulation Profile: No results for input(s): INR, PROTIME in the last 168 hours. Cardiac Enzymes: No results for input(s): CKTOTAL, CKMB, CKMBINDEX, TROPONINI in the last 168 hours. BNP (last 3 results) No results for input(s): PROBNP in the last 8760 hours. HbA1C: No results for input(s): HGBA1C in the last 72 hours. CBG: Recent Labs  Lab 03/16/19 0827  GLUCAP 72   Lipid Profile: No results for input(s): CHOL, HDL, LDLCALC, TRIG, CHOLHDL, LDLDIRECT in the last 72 hours. Thyroid Function Tests: No results for input(s): TSH, T4TOTAL, FREET4, T3FREE, THYROIDAB in the last 72 hours. Anemia Panel: No results for input(s): VITAMINB12, FOLATE, FERRITIN, TIBC, IRON, RETICCTPCT in the last 72 hours. Sepsis Labs: No results for input(s): PROCALCITON, LATICACIDVEN in the last 168 hours.  Recent Results (from the past 240 hour(s))  SARS CORONAVIRUS 2 (TAT 6-24 HRS) Nasopharyngeal Nasopharyngeal Swab     Status: None   Collection Time: 03/15/19  8:57 PM   Specimen: Nasopharyngeal Swab  Result Value Ref Range  Status   SARS Coronavirus 2 NEGATIVE NEGATIVE Final    Comment: (NOTE) SARS-CoV-2 target nucleic acids are NOT DETECTED. The SARS-CoV-2 RNA is generally detectable in upper and lower respiratory specimens during the acute phase of infection. Negative results do not preclude SARS-CoV-2 infection, do not rule out co-infections with other pathogens, and should not be used as the sole basis for treatment or other patient management decisions. Negative results must be combined with clinical observations, patient history, and epidemiological information. The expected result is Negative. Fact Sheet for Patients: HairSlick.no Fact Sheet for Healthcare  Providers: quierodirigir.com This test is not yet approved or cleared by the [place] FDA and  has been authorized for detection and/or diagnosis of SARS-CoV-2 by FDA under an Emergency Use Authorization (EUA). This EUA will remain  in effect (meaning this test can be used) for the duration of the COVID-19 declaration under Section 56 4(b)(1) of the Act, 21 U.S.C. section 360bbb-3(b)(1), unless the authorization is terminated or revoked sooner. Performed at [facility], [address], [place], [place] [address]          Radiology Studies: CT ABDOMEN PELVIS WO CONTRAST  Result Date: 03/15/2019 CLINICAL DATA:  Follow-up small-bowel obstruction EXAM: CT ABDOMEN AND PELVIS WITHOUT CONTRAST TECHNIQUE: Multidetector CT imaging of the abdomen and pelvis was performed following the standard protocol without IV contrast. COMPARISON:  03/08/2019 FINDINGS: Lower chest: No acute abnormality. Hepatobiliary: Gallbladder is partially contracted. Stable appearance of scattered low-density lesions throughout the liver including a subcapsular lesion at the posterior aspect of the right hepatic lobe containing dystrophic calcification. No definite new hepatic lesion identified. No biliary dilatation. Pancreas: Unremarkable. No pancreatic ductal dilatation or surrounding inflammatory changes. Spleen: Normal in size without focal abnormality. Adrenals/Urinary Tract: Adrenal glands are unremarkable. Kidneys are within normal limits without focal lesion or hydronephrosis. There are renal vascular calcifications. Bladder is unremarkable. Stomach/Bowel: Persistent small bowel obstruction with transition point in the right hemipelvis (series 6, images 30-34). The degree of bowel dilatation is similar to the previous exam. By the colon remains largely collapsed. Stomach is mildly distended as well. Vascular/Lymphatic: Aortic atherosclerosis. No enlarged abdominal or pelvic lymph  nodes. Reproductive: Status post hysterectomy. No adnexal masses. Other: No ascites. No pneumoperitoneum. No abdominal wall hernia. Musculoskeletal: No new or acute osseous findings. IMPRESSION: 1. Persistent small bowel obstruction with transition point in the right hemipelvis, likely secondary to adhesion. No free fluid or free air to suggest perforation. 2. Additional chronic findings, as above. Aortic Atherosclerosis (ICD10-I70.0). Electronically Signed   By: Duanne Guess D.O.   On: 03/15/2019 18:43   DG Chest Portable 1 View  Result Date: 03/15/2019 CLINICAL DATA:  NG tube placement EXAM: PORTABLE CHEST 1 VIEW COMPARISON:  03/08/2019 FINDINGS: Suspected skin fold artifact over the right upper thorax. Clips at the right chest. No acute consolidation, pleural effusion or pneumothorax. Stable cardiomediastinal silhouette with aortic atherosclerosis. Esophageal tube tip is below the diaphragm but incompletely visualized. IMPRESSION: 1. Esophageal tube tip below the diaphragm but incompletely visualized. 2. No acute airspace disease. Electronically Signed   By: Jasmine Pang M.D.   On: 03/15/2019 21:53        Scheduled Meds: . heparin  5,000 Units Subcutaneous Q8H  . sodium chloride flush  3 mL Intravenous Q12H   Continuous Infusions: . sodium chloride    . sodium chloride    . magnesium sulfate bolus IVPB    . potassium chloride 10 mEq (03/17/19 0943)  LOS: 2 days    Time spent: 30 minutes    Rosene Pilling Hoover Brunette, DO [facility] Pager [phone]  If 7PM-7AM, please contact night-coverage [email or web address] Password [facility] 03/17/2019, 11:03 AM

## 2019-03-18 LAB — COMPREHENSIVE METABOLIC PANEL
ALT: 21 U/L (ref 0–44)
AST: 18 U/L (ref 15–41)
Albumin: 2.7 g/dL — ABNORMAL LOW (ref 3.5–5.0)
Alkaline Phosphatase: 64 U/L (ref 38–126)
Anion gap: 24 — ABNORMAL HIGH (ref 5–15)
BUN: 33 mg/dL — ABNORMAL HIGH (ref 8–23)
CO2: 28 mmol/L (ref 22–32)
Calcium: 7.5 mg/dL — ABNORMAL LOW (ref 8.9–10.3)
Chloride: 100 mmol/L (ref 98–111)
Creatinine, Ser: 1.76 mg/dL — ABNORMAL HIGH (ref 0.44–1.00)
GFR calc Af Amer: 29 mL/min — ABNORMAL LOW (ref >60)
GFR calc non Af Amer: 25 mL/min — ABNORMAL LOW (ref >60)
Glucose, Bld: 83 mg/dL (ref 70–99)
Potassium: 3 mmol/L — ABNORMAL LOW (ref 3.5–5.1)
Sodium: 152 mmol/L — ABNORMAL HIGH (ref 135–145)
Total Bilirubin: 1.1 mg/dL (ref 0.3–1.2)
Total Protein: 5.3 g/dL — ABNORMAL LOW (ref 6.5–8.1)

## 2019-03-18 LAB — CBC
HCT: 31.5 % — ABNORMAL LOW (ref 36.0–46.0)
Hemoglobin: 9.5 g/dL — ABNORMAL LOW (ref 12.0–15.0)
MCH: 29.3 pg (ref 26.0–34.0)
MCHC: 30.2 g/dL (ref 30.0–36.0)
MCV: 97.2 fL (ref 80.0–100.0)
Platelets: 348 10*3/uL (ref 150–400)
RBC: 3.24 MIL/uL — ABNORMAL LOW (ref 3.87–5.11)
RDW: 15.5 % (ref 11.5–15.5)
WBC: 11.1 10*3/uL — ABNORMAL HIGH (ref 4.0–10.5)
nRBC: 0 % (ref 0.0–0.2)

## 2019-03-18 LAB — MAGNESIUM: Magnesium: 2.5 mg/dL — ABNORMAL HIGH (ref 1.7–2.4)

## 2019-03-18 MED ORDER — KCL IN DEXTROSE-NACL 20-5-0.45 MEQ/L-%-% IV SOLN: INTRAVENOUS | Status: DC

## 2019-03-18 MED ORDER — POTASSIUM CHLORIDE 10 MEQ/100ML IV SOLN
10.0000 meq | INTRAVENOUS | Status: AC
  Administered 2019-03-18 (×4): 10 meq via INTRAVENOUS
  Filled 2019-03-18: qty 100

## 2019-03-18 MED ORDER — POLYETHYLENE GLYCOL 3350 17 G PO PACK
17.0000 g | PACK | Freq: Every day | ORAL | Status: DC
  Administered 2019-03-18 – 2019-03-19 (×2): 17 g via ORAL
  Filled 2019-03-18 (×2): qty 1

## 2019-03-18 NOTE — Progress Notes (Signed)
  Subjective: Patient has no complaints.  Is having bowel movements and passing flatus.  No abdominal pain is noted.  Objective: Vital signs in last 24 hours: Temp:  [98.3 F (36.8 C)-98.4 F (36.9 C)] 98.4 F (36.9 C) (01/03 0524) Pulse Rate:  [82-87] 86 (01/03 0524) Resp:  [18] 18 (01/03 0524) BP: (113-137)/(51-68) 113/51 (01/03 0524) SpO2:  [98 %-99 %] 99 % (01/03 0524) Last BM Date: 03/17/19  Intake/Output from previous day: 01/02 0701 - 01/03 0700 In: 985.6 [I.V.:617.1; IV Piggyback:368.5] Out: 850 [Emesis/NG output:850] Intake/Output this shift: No intake/output data recorded.  General appearance: alert, cooperative and no distress GI: soft, non-tender; bowel sounds normal; no masses,  no organomegaly  Lab Results:  Recent Labs    03/17/19 0559 03/18/19 0617  WBC 8.9 11.1*  HGB 9.0* 9.5*  HCT 29.0* 31.5*  PLT 278 348   BMET Recent Labs    03/17/19 0559 03/18/19 0617  NA 148* 152*  K 3.0* 3.0*  CL 105 100  CO2 24 28  GLUCOSE 79 83  BUN 38* 33*  CREATININE 1.63* 1.76*  CALCIUM 7.5* 7.5*   PT/INR No results for input(s): LABPROT, INR in the last 72 hours.  Studies/Results: No results found.  Anti-infectives: Anti-infectives (From admission, onward)   None      Assessment/Plan: Impression: Small bowel obstruction, resolving Plan: Have removed NG tube.  Will start clear liquid diet.  Will get patient up in chair.  Have adjusted IV fluids and supplement potassium.  Hypomagnesemia resolved.  LOS: 3 days    Aviva Signs 03/18/2019

## 2019-03-18 NOTE — Progress Notes (Signed)
PROGRESS NOTE    [name]  MWU:[id number] DOB: 02/05/1929 DOA: 03/15/2019 PCP: Babs Sciara, MD   Brief Narrative:  Per HPI: Lauren Pro McAlisteris a 86 y.o.femalewith medical history significant ofcecal adenocarcinoma, hypertension, urothelial carcinoma, breast cancer, COPD, prediabetes who presented to the ER with abdominal pain. Patient was recently admitted here with a partial small bowel obstruction from 03/08/2019-03/12/2019. She was discharged after tolerating oral feeds and having multiple bowel movements. She states she has had recurrent symptoms in the last 24 hours with increased abdominal pain, distention, nausea, reduced appetite. Unable to tolerate anything p.o. in the last 24 hours. Feels really weak and dehydrated. Contacted her PCP who recommended coming back to the ER. Lives alone and is usually independent at baseline. Currently having difficulty caring for herself because of weakness.  1/1:Patient was admitted with partial small bowel obstruction secondary to adhesive disease along with AKI on CKD stage III and deconditioning in the setting of cecal adenocarcinoma. She remains on NG tube to low intermittent suction with IV fluid hydration. She has been seen by general surgery with plans to monitor conservatively for now with small bowel follow-through in the next 1-2 days. No need for operative intervention at this time.  1/2: Noted to have some hypomagnesemia and hypokalemia this morning which will be repleted.  Otherwise doing well and passing flatus with 2 bowel movements.  Continue NG tube to suction with high output still noted per general surgery.  1/3: No further hypomagnesemia noted, however patient still hypokalemic.  Further replacement ordered by general surgery.  NG tube has been removed and clear liquid diet started today.  She appears to be a tolerating clears today.  Assessment & Plan:   Principal Problem:   SBO (small bowel  obstruction) (HCC) Active Problems:   Essential hypertension, benign   Carotid artery disease (HCC)   Hyperlipidemia   Peripheral neuropathy   Breast cancer, right breast (HCC)   COPD (chronic obstructive pulmonary disease) (HCC)   Chronic respiratory failure with hypoxia (HCC)   Bladder cancer (HCC)   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   Partial small bowel obstruction likely secondary to adhesions -Status post recent proximal right colectomy on 11/2018 for cecal malignancy -NG tube removed by general surgery 1/3 -Continue IV Dilaudid as needed for pain control and Zofran as needed for nausea or vomiting -Appreciate ongoing general surgery consultation;patient does state that she would like to go to [place] with operative intervention as needed -Continue IV fluid hydration as well as clear liquid diet and advance as tolerated -Monitor electrolytes in a.m.  Hypokalemia -Continue IV repletion and monitor in a.m. -Magnesium repleted  Hypernatremia -IV fluid to D5 half-normal saline and will continue to monitor while on clear liquids  AKI on CKD stage III (baseline creatinine 1.5)-stable -Continue IV fluid and repeat a.m. labs -Fluid balance currently +1.3 L  Hypertension-stable -Hold amlodipine for now with labetalol as needed for elevations  Cecal adenocarcinoma stage II -Prior right hemicolectomy 12/08/2018 -Follows with oncology Dr. Ellin Saba and apparently patient does not require any adjuvant chemotherapy  Urothelial carcinoma-high-grade to left distal ureter -Follows with urology  History of stage I right breast cancer -Continue to follow-up with oncology  DVT prophylaxis:Heparin Code Status:DNR Family Communication:None at bedside Disposition Plan:Continue conservative management of SBO with plans for small bowel follow-through in the near future.  Per general surgery.  Anticipate discharge in next 1-2 days depending on clinical  course.   Consultants:  General surgery  Procedures:  None  Antimicrobials:   None  Subjective: Patient seen and evaluated today with no new acute complaints or concerns. No acute concerns or events noted overnight.  Objective: Vitals:   03/17/19 0638 03/17/19 1353 03/17/19 2134 03/18/19 0524  BP: (!) 157/81 137/68 122/66 (!) 113/51  Pulse: 91 87 82 86  Resp:  18 18 18   Temp: (!) 97.5 F (36.4 C) 98.3 F (36.8 C) 98.3 F (36.8 C) 98.4 F (36.9 C)  TempSrc: Oral     SpO2: 97% 98% 99% 99%  Weight:        Intake/Output Summary (Last 24 hours) at 03/18/2019 1044 Last data filed at 03/18/2019 0300 Gross per 24 hour  Intake 985.61 ml  Output 850 ml  Net 135.61 ml   Filed Weights   03/17/19 0500  Weight: 50.5 kg    Examination:  General exam: Appears calm and comfortable  Respiratory system: Clear to auscultation. Respiratory effort normal.  Currently on room air. Cardiovascular system: S1 & S2 heard, RRR. No JVD, murmurs, rubs, gallops or clicks. No pedal edema. Gastrointestinal system: Abdomen is nondistended, soft and nontender. No organomegaly or masses felt. Normal bowel sounds heard. Central nervous system: Alert and oriented. No focal neurological deficits. Extremities: Symmetric 5 x 5 power. Skin: No rashes, lesions or ulcers Psychiatry: Judgement and insight appear normal. Mood & affect appropriate.     Data Reviewed: I have personally reviewed following labs and imaging studies  CBC: Recent Labs  Lab 03/12/19 0548 03/15/19 1551 03/16/19 0420 03/17/19 0559 03/18/19 0617  WBC 8.6 10.9* 9.6 8.9 11.1*  NEUTROABS 5.9  --   --   --   --   HGB 10.3* 10.1* 8.6* 9.0* 9.5*  HCT 34.3* 32.9* 28.7* 29.0* 31.5*  MCV 96.9 95.1 97.6 94.8 97.2  PLT 371 350 246 278 348   Basic Metabolic Panel: Recent Labs  Lab 03/12/19 0548 03/15/19 1551 03/16/19 0420 03/17/19 0559 03/18/19 0617  NA 144 136 138 148* 152*  K 5.0 3.8 3.6 3.0* 3.0*  CL 108 101 108  105 100  CO2 22 19* 20* 24 28  GLUCOSE 101* 104* 85 79 83  BUN 22 57* 52* 38* 33*  CREATININE 1.59* 2.31* 2.09* 1.63* 1.76*  CALCIUM 8.2* 7.6* 6.8* 7.5* 7.5*  MG  --   --   --  1.6* 2.5*   GFR: Estimated Creatinine Clearance: 17.6 mL/min (A) (by C-G formula based on SCr of 1.76 mg/dL (H)). Liver Function Tests: Recent Labs  Lab 03/15/19 1551 03/16/19 0420 03/17/19 0559 03/18/19 0617  AST 19 16 18 18   ALT 24 20 20 21   ALKPHOS 80 67 62 64  BILITOT 0.4 0.8 1.0 1.1  PROT 6.3* 5.3* 4.9* 5.3*  ALBUMIN 3.4* 2.7* 2.5* 2.7*   Recent Labs  Lab 03/15/19 1551  LIPASE 52*   No results for input(s): AMMONIA in the last 168 hours. Coagulation Profile: No results for input(s): INR, PROTIME in the last 168 hours. Cardiac Enzymes: No results for input(s): CKTOTAL, CKMB, CKMBINDEX, TROPONINI in the last 168 hours. BNP (last 3 results) No results for input(s): PROBNP in the last 8760 hours. HbA1C: No results for input(s): HGBA1C in the last 72 hours. CBG: Recent Labs  Lab 03/16/19 0827  GLUCAP 72   Lipid Profile: No results for input(s): CHOL, HDL, LDLCALC, TRIG, CHOLHDL, LDLDIRECT in the last 72 hours. Thyroid Function Tests: No results for input(s): TSH, T4TOTAL, FREET4, T3FREE, THYROIDAB in the last 72 hours. Anemia Panel: No  results for input(s): VITAMINB12, FOLATE, FERRITIN, TIBC, IRON, RETICCTPCT in the last 72 hours. Sepsis Labs: No results for input(s): PROCALCITON, LATICACIDVEN in the last 168 hours.  Recent Results (from the past 240 hour(s))  SARS CORONAVIRUS 2 (TAT 6-24 HRS) Nasopharyngeal Nasopharyngeal Swab     Status: None   Collection Time: 03/15/19  8:57 PM   Specimen: Nasopharyngeal Swab  Result Value Ref Range Status   SARS Coronavirus 2 NEGATIVE NEGATIVE Final    Comment: (NOTE) SARS-CoV-2 target nucleic acids are NOT DETECTED. The SARS-CoV-2 RNA is generally detectable in upper and lower respiratory specimens during the acute phase of infection.  Negative results do not preclude SARS-CoV-2 infection, do not rule out co-infections with other pathogens, and should not be used as the sole basis for treatment or other patient management decisions. Negative results must be combined with clinical observations, patient history, and epidemiological information. The expected result is Negative. Fact Sheet for Patients: HairSlick.no Fact Sheet for Healthcare Providers: quierodirigir.com This test is not yet approved or cleared by the [place] FDA and  has been authorized for detection and/or diagnosis of SARS-CoV-2 by FDA under an Emergency Use Authorization (EUA). This EUA will remain  in effect (meaning this test can be used) for the duration of the COVID-19 declaration under Section 56 4(b)(1) of the Act, 21 U.S.C. section 360bbb-3(b)(1), unless the authorization is terminated or revoked sooner. Performed at [facility], [address] [place], [place] [address]          Radiology Studies: No results found.      Scheduled Meds: . heparin  5,000 Units Subcutaneous Q8H  . polyethylene glycol  17 g Oral Daily  . sodium chloride flush  3 mL Intravenous Q12H   Continuous Infusions: . sodium chloride    . dextrose 5 % and 0.45 % NaCl with KCl 20 mEq/L 75 mL/hr at 03/18/19 0846  . potassium chloride 10 mEq (03/18/19 0943)     LOS: 3 days    Time spent: 30 minutes    Tannon Peerson Hoover Brunette, DO [facility] Pager [phone]  If 7PM-7AM, please contact night-coverage [email or web address] Password [facility] 03/18/2019, 10:44 AM

## 2019-03-19 ENCOUNTER — Ambulatory Visit: Payer: Medicare Other | Admitting: Family Medicine

## 2019-03-19 LAB — BASIC METABOLIC PANEL
Anion gap: 10 (ref 5–15)
BUN: 25 mg/dL — ABNORMAL HIGH (ref 8–23)
CO2: 32 mmol/L (ref 22–32)
Calcium: 7.1 mg/dL — ABNORMAL LOW (ref 8.9–10.3)
Chloride: 102 mmol/L (ref 98–111)
Creatinine, Ser: 1.66 mg/dL — ABNORMAL HIGH (ref 0.44–1.00)
GFR calc Af Amer: 32 mL/min — ABNORMAL LOW (ref >60)
GFR calc non Af Amer: 27 mL/min — ABNORMAL LOW (ref >60)
Glucose, Bld: 122 mg/dL — ABNORMAL HIGH (ref 70–99)
Potassium: 3.8 mmol/L (ref 3.5–5.1)
Sodium: 144 mmol/L (ref 135–145)

## 2019-03-19 LAB — CBC
HCT: 26.4 % — ABNORMAL LOW (ref 36.0–46.0)
Hemoglobin: 7.8 g/dL — ABNORMAL LOW (ref 12.0–15.0)
MCH: 29.4 pg (ref 26.0–34.0)
MCHC: 29.5 g/dL — ABNORMAL LOW (ref 30.0–36.0)
MCV: 99.6 fL (ref 80.0–100.0)
Platelets: 283 10*3/uL (ref 150–400)
RBC: 2.65 MIL/uL — ABNORMAL LOW (ref 3.87–5.11)
RDW: 15.6 % — ABNORMAL HIGH (ref 11.5–15.5)
WBC: 8.9 10*3/uL (ref 4.0–10.5)
nRBC: 0 % (ref 0.0–0.2)

## 2019-03-19 LAB — MAGNESIUM: Magnesium: 2 mg/dL (ref 1.7–2.4)

## 2019-03-19 MED ORDER — ONDANSETRON HCL 4 MG PO TABS: 4.0000 mg | ORAL_TABLET | Freq: Four times a day (QID) | ORAL | 0 refills | Status: AC | PRN

## 2019-03-19 NOTE — Progress Notes (Signed)
  Subjective: Wake up patient denies any nausea, vomiting, or abdominal pain.  She is having bowel movements.  Objective: Vital signs in last 24 hours: Temp:  [97.6 F (36.4 C)-99 F (37.2 C)] 98 F (36.7 C) (01/04 0549) Pulse Rate:  [66-82] 66 (01/04 0549) Resp:  [17-18] 18 (01/04 0549) BP: (122-125)/(54-59) 125/58 (01/04 0549) SpO2:  [100 %] 100 % (01/04 0549) Weight:  [55.2 kg] 55.2 kg (01/04 0549) Last BM Date: 03/17/19  Intake/Output from previous day: 01/03 0701 - 01/04 0700 In: 2172 [P.O.:720; I.V.:1065.8; IV Piggyback:386.2] Out: 3 [Urine:3] Intake/Output this shift: No intake/output data recorded.  General appearance: alert, cooperative and no distress GI: soft, non-tender; bowel sounds normal; no masses,  no organomegaly  Lab Results:  Recent Labs    03/18/19 0617 03/19/19 0555  WBC 11.1* 8.9  HGB 9.5* 7.8*  HCT 31.5* 26.4*  PLT 348 283   BMET Recent Labs    03/18/19 0617 03/19/19 0555  NA 152* 144  K 3.0* 3.8  CL 100 102  CO2 28 32  GLUCOSE 83 122*  BUN 33* 25*  CREATININE 1.76* 1.66*  CALCIUM 7.5* 7.1*   PT/INR No results for input(s): LABPROT, INR in the last 72 hours.  Studies/Results: No results found.  Anti-infectives: Anti-infectives (From admission, onward)   None      Assessment/Plan: Impression: Recurrent small bowel obstruction, resolved Plan: Will advance to soft diet this morning.  Should she tolerate this, she desires to go home which is fine with me.  Discussed with Dr. Manuella Ghazi.  LOS: 4 days    Aviva Signs 03/19/2019

## 2019-03-19 NOTE — Discharge Summary (Signed)
Physician Discharge Summary  [name] DJS:[id number] DOB: 06-26-1928 DOA: 03/15/2019  PCP: Kathyrn Drown, MD  Admit date: 03/15/2019  Discharge date: 03/19/2019  Admitted From:Home  Disposition:  Home  Recommendations for Outpatient Follow-up:  1. Follow up with PCP in 1-2 weeks repeat BMP and magnesium in 1 week 2. Follow-up with general surgeon in [place] once scheduled 3. Use Zofran as needed for nausea or vomiting  Home Health: None  Equipment/Devices: None  Discharge Condition: Stable  CODE STATUS: DNR  Diet recommendation: Heart Healthy  Brief/Interim Summary: Per HPI: [name] McAlisteris a 86 y.o.femalewith medical history significant ofcecal adenocarcinoma, hypertension, urothelial carcinoma, breast cancer, COPD, prediabetes who presented to the ER with abdominal pain. Patient was recently admitted here with a partial small bowel obstruction from 03/08/2019-03/12/2019. She was discharged after tolerating oral feeds and having multiple bowel movements. She states she has had recurrent symptoms in the last 24 hours with increased abdominal pain, distention, nausea, reduced appetite. Unable to tolerate anything p.o. in the last 24 hours. Feels really weak and dehydrated. Contacted her PCP who recommended coming back to the ER. Lives alone and is usually independent at baseline. Currently having difficulty caring for herself because of weakness.  1/1:Patient was admitted with partial small bowel obstruction secondary to adhesive disease along with AKI on CKD stage III and deconditioning in the setting of cecal adenocarcinoma. She remains on NG tube to low intermittent suction with IV fluid hydration. She has been seen by general surgery with plans to monitor conservatively for now with small bowel follow-through in the next 1-2 days. No need for operative intervention at this time.  1/2:Noted to have some hypomagnesemia and hypokalemia this morning  which will be repleted. Otherwise doing well and passing flatus with 2 bowel movements. Continue NG tube to suction with high output still noted per general surgery.  1/3: No further hypomagnesemia noted, however patient still hypokalemic.  Further replacement ordered by general surgery.  NG tube has been removed and clear liquid diet started today.  She appears to be a tolerating clears today.  1/4: Patient has been advanced to soft diet by general surgery and she appears to be doing quite well with no further nausea or vomiting or abdominal distention.  She is passing bowel movements and flatus and is ready to go home.  Potassium and magnesium have normalized.  Will need recheck in 1 week.  Zofran given as needed for nausea or vomiting.  Discharge Diagnoses:  Principal Problem:   SBO (small bowel obstruction) (HCC) Active Problems:   Essential hypertension, benign   Carotid artery disease (HCC)   Hyperlipidemia   Peripheral neuropathy   Breast cancer, right breast (HCC)   COPD (chronic obstructive pulmonary disease) (HCC)   Chronic respiratory failure with hypoxia (HCC)   Bladder cancer (HCC)   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  Principal discharge diagnosis: Partial small bowel obstruction likely secondary to adhesions  Discharge Instructions  Discharge Instructions    Diet - low sodium heart healthy   Complete by: As directed    Increase activity slowly   Complete by: As directed      Allergies as of 03/19/2019      Reactions   Augmentin [amoxicillin-pot Clavulanate] Nausea And Vomiting   Has patient had a PCN reaction causing immediate rash, facial/tongue/throat swelling, SOB or lightheadedness with hypotension: No Has patient had a PCN reaction causing severe rash involving mucus membranes or skin necrosis: No Has patient had  a PCN reaction that required hospitalization: No Has patient had a PCN reaction occurring within the last 10 years: Unknown If  all of the above answers are "NO", then may proceed with Cephalosporin use.   Amoxicillin Diarrhea   Has patient had a PCN reaction causing immediate rash, facial/tongue/throat swelling, SOB or lightheadedness with hypotension: No Has patient had a PCN reaction causing severe rash involving mucus membranes or skin necrosis: No Has patient had a PCN reaction that required hospitalization: No Has patient had a PCN reaction occurring within the last 10 years: Unknown If all of the above answers are "NO", then may proceed with Cephalosporin use.   Codeine Nausea And Vomiting   Morphine Nausea And Vomiting   Neomycin-bacitracin Zn-polymyx Itching, Rash   Unable to use Neosporin      Medication List    TAKE these medications   amLODipine 5 MG tablet Commonly known as: NORVASC TAKE ONE TABLET BY MOUTH ONCE DAILY.   anastrozole 1 MG tablet Commonly known as: ARIMIDEX Take 1 tablet (1 mg total) by mouth daily. What changed: when to take this   fluticasone 50 MCG/ACT nasal spray Commonly known as: FLONASE Place 2 sprays into both nostrils daily as needed for allergies.   furosemide 20 MG tablet Commonly known as: LASIX Take 1 tablet (20 mg total) by mouth daily as needed for fluid.   hydrocortisone 2.5 % rectal cream Commonly known as: ANUSOL-HC Place 1 application rectally 2 (two) times daily as needed for hemorrhoids or anal itching.   levocetirizine 5 MG tablet Commonly known as: XYZAL Take 5 mg by mouth daily as needed for allergies.   multivitamin with minerals tablet Take 1 tablet by mouth daily.   nortriptyline 10 MG capsule Commonly known as: PAMELOR TAKE 2 TO 3 CAPSULES BY MOUTH NIGHTLY. What changed: See the new instructions.   ondansetron 4 MG tablet Commonly known as: ZOFRAN Take 1 tablet (4 mg total) by mouth every 6 (six) hours as needed for nausea.   OXYGEN Inhale 2 L into the lungs. As needed for activities requiring exertion, per patient uses very  sparingly   potassium chloride 10 MEQ tablet Commonly known as: KLOR-CON Take one tablet bid on the days you take lasix ( furosemide) What changed:   how much to take  how to take this  when to take this   ProAir RespiClick 852 (90 Base) MCG/ACT Aepb Generic drug: Albuterol Sulfate INHALE 1 PUFF INTO THE LUNGS EVERY 4 HOURS AS NEEDED. What changed: See the new instructions.      Follow-up Information    Kathyrn Drown, MD Follow up in 1 week(s).   Specialty: Family Medicine Contact information: [place] [address] [phone]          Allergies  Allergen Reactions  . Augmentin [Amoxicillin-Pot Clavulanate] Nausea And Vomiting    Has patient had a PCN reaction causing immediate rash, facial/tongue/throat swelling, SOB or lightheadedness with hypotension: No Has patient had a PCN reaction causing severe rash involving mucus membranes or skin necrosis: No Has patient had a PCN reaction that required hospitalization: No Has patient had a PCN reaction occurring within the last 10 years: Unknown If all of the above answers are "NO", then may proceed with Cephalosporin use.    Marland Kitchen Amoxicillin Diarrhea    Has patient had a PCN reaction causing immediate rash, facial/tongue/throat swelling, SOB or lightheadedness with hypotension: No Has patient had a PCN reaction causing severe rash involving  mucus membranes or skin necrosis: No Has patient had a PCN reaction that required hospitalization: No Has patient had a PCN reaction occurring within the last 10 years: Unknown If all of the above answers are "NO", then may proceed with Cephalosporin use.   . Codeine Nausea And Vomiting  . Morphine Nausea And Vomiting  . Neomycin-Bacitracin Zn-Polymyx Itching and Rash    Unable to use Neosporin    Consultations:  General surgery   Procedures/Studies: CT ABDOMEN PELVIS WO CONTRAST  Result Date: 03/15/2019 CLINICAL DATA:  Follow-up small-bowel  obstruction EXAM: CT ABDOMEN AND PELVIS WITHOUT CONTRAST TECHNIQUE: Multidetector CT imaging of the abdomen and pelvis was performed following the standard protocol without IV contrast. COMPARISON:  03/08/2019 FINDINGS: Lower chest: No acute abnormality. Hepatobiliary: Gallbladder is partially contracted. Stable appearance of scattered low-density lesions throughout the liver including a subcapsular lesion at the posterior aspect of the right hepatic lobe containing dystrophic calcification. No definite new hepatic lesion identified. No biliary dilatation. Pancreas: Unremarkable. No pancreatic ductal dilatation or surrounding inflammatory changes. Spleen: Normal in size without focal abnormality. Adrenals/Urinary Tract: Adrenal glands are unremarkable. Kidneys are within normal limits without focal lesion or hydronephrosis. There are renal vascular calcifications. Bladder is unremarkable. Stomach/Bowel: Persistent small bowel obstruction with transition point in the right hemipelvis (series 6, images 30-34). The degree of bowel dilatation is similar to the previous exam. By the colon remains largely collapsed. Stomach is mildly distended as well. Vascular/Lymphatic: Aortic atherosclerosis. No enlarged abdominal or pelvic lymph nodes. Reproductive: Status post hysterectomy. No adnexal masses. Other: No ascites. No pneumoperitoneum. No abdominal wall hernia. Musculoskeletal: No new or acute osseous findings. IMPRESSION: 1. Persistent small bowel obstruction with transition point in the right hemipelvis, likely secondary to adhesion. No free fluid or free air to suggest perforation. 2. Additional chronic findings, as above. Aortic Atherosclerosis (ICD10-I70.0). Electronically Signed   By: Davina Poke D.O.   On: 03/15/2019 18:43   CT ABDOMEN PELVIS WO CONTRAST  Result Date: 03/08/2019 CLINICAL DATA:  Abdominal pain, nausea, and vomiting for a week, history breast cancer, COPD, hypertension, diabetes mellitus  EXAM: CT ABDOMEN AND PELVIS WITHOUT CONTRAST TECHNIQUE: Multidetector CT imaging of the abdomen and pelvis was performed following the standard protocol without IV contrast. Sagittal and coronal MPR images reconstructed from axial data set. No oral contrast was administered. COMPARISON:  12/04/2018 FINDINGS: Lower chest: Chronic atelectasis versus scarring at RIGHT lower lobe. Remaining lung bases clear. Hepatobiliary: Gallbladder unremarkable. Multiple low-attenuation foci within liver likely representing small cysts. Additional cystic lesion with dystrophic calcification 2.6 x 2.4 cm image 17, posterior RIGHT lobe liver, unchanged. Pancreas: Normal appearance Spleen: Normal appearance Adrenals/Urinary Tract: Thickening of adrenal glands without discrete mass. Renal vascular calcifications. No definite renal mass or hydronephrosis. Bladder and ureters unremarkable. Stomach/Bowel: Prior ileocolic resection. Stomach unremarkable. Colon decompressed. Dilated proximal and decompressed distal small bowel loops compatible with small bowel obstruction. Transition from dilated to nondilated small bowel occurs in the RIGHT lower quadrant, favor adhesion. Minimal scattered edema within mesentery. No definite bowel wall thickening. Vascular/Lymphatic: Atherosclerotic calcifications aorta and iliac arteries. Aorta normal caliber. No adenopathy. Reproductive: Uterus surgically absent. Nonvisualization of ovaries. Other: No free air or free fluid.  No hernia. Musculoskeletal: Osseous demineralization. IMPRESSION: Distal small bowel obstruction, suspect related to adhesion in the RIGHT upper pelvis. Stable hepatic cysts including a 2.6 x 2.4 cm cyst with dystrophic calcification at the posterior RIGHT lobe liver. Aortic Atherosclerosis (ICD10-I70.0). Electronically Signed   By: Crist Infante.D.  On: 03/08/2019 18:58   DG Chest Portable 1 View  Result Date: 03/15/2019 CLINICAL DATA:  NG tube placement EXAM: PORTABLE CHEST 1  VIEW COMPARISON:  03/08/2019 FINDINGS: Suspected skin fold artifact over the right upper thorax. Clips at the right chest. No acute consolidation, pleural effusion or pneumothorax. Stable cardiomediastinal silhouette with aortic atherosclerosis. Esophageal tube tip is below the diaphragm but incompletely visualized. IMPRESSION: 1. Esophageal tube tip below the diaphragm but incompletely visualized. 2. No acute airspace disease. Electronically Signed   By: Donavan Foil M.D.   On: 03/15/2019 21:53   DG Chest Port 1 View  Result Date: 03/08/2019 CLINICAL DATA:  Abdominal pain with nausea and vomiting. EXAM: PORTABLE CHEST 1 VIEW COMPARISON:  Chest x-ray dated 03/28/2018 FINDINGS: The heart size and pulmonary vascularity are normal. Aortic atherosclerosis. The lungs are clear. No significant bone abnormality. IMPRESSION: 1. No active disease in the chest. 2.  Aortic Atherosclerosis (ICD10-I70.0). Electronically Signed   By: Lorriane Shire M.D.   On: 03/08/2019 16:28   DG ABD ACUTE 2+V W 1V CHEST  Result Date: 03/09/2019 CLINICAL DATA:  Abdominal pain with nausea and vomiting. EXAM: DG ABDOMEN ACUTE W/ 1V CHEST COMPARISON:  Chest x-ray 03/08/2019 FINDINGS: Lungs are adequately inflated without focal airspace consolidation or effusion. Cardiomediastinal silhouette and remainder of the chest is unchanged. Abdominopelvic images demonstrate a nasogastric tube with tip over the stomach in the left upper quadrant. Bowel gas pattern is nonobstructive. There is no free peritoneal air. There are degenerative changes of the spine and hips. IMPRESSION: Negative abdominal radiographs.  No acute cardiopulmonary disease. Nasogastric tube with tip over the stomach in the left upper quadrant. Electronically Signed   By: Marin Olp M.D.   On: 03/09/2019 10:05     Discharge Exam: Vitals:   03/18/19 2123 03/19/19 0549  BP: (!) 123/59 (!) 125/58  Pulse: 70 66  Resp: 17 18  Temp: 97.6 F (36.4 C) 98 F (36.7 C)   SpO2: 100% 100%   Vitals:   03/18/19 0524 03/18/19 1401 03/18/19 2123 03/19/19 0549  BP: (!) 113/51 (!) 122/54 (!) 123/59 (!) 125/58  Pulse: 86 82 70 66  Resp: 18 17 17 18   Temp: 98.4 F (36.9 C) 99 F (37.2 C) 97.6 F (36.4 C) 98 F (36.7 C)  TempSrc:  Oral Oral Oral  SpO2: 99% 100% 100% 100%  Weight:    55.2 kg    General: Pt is alert, awake, not in acute distress Cardiovascular: RRR, S1/S2 +, no rubs, no gallops Respiratory: CTA bilaterally, no wheezing, no rhonchi Abdominal: Soft, NT, ND, bowel sounds + Extremities: no edema, no cyanosis    The results of significant diagnostics from this hospitalization (including imaging, microbiology, ancillary and laboratory) are listed below for reference.     Microbiology: Recent Results (from the past 240 hour(s))  SARS CORONAVIRUS 2 (TAT 6-24 HRS) Nasopharyngeal Nasopharyngeal Swab     Status: None   Collection Time: 03/15/19  8:57 PM   Specimen: Nasopharyngeal Swab  Result Value Ref Range Status   SARS Coronavirus 2 NEGATIVE NEGATIVE Final    Comment: (NOTE) SARS-CoV-2 target nucleic acids are NOT DETECTED. The SARS-CoV-2 RNA is generally detectable in upper and lower respiratory specimens during the acute phase of infection. Negative results do not preclude SARS-CoV-2 infection, do not rule out co-infections with other pathogens, and should not be used as the sole basis for treatment or other patient management decisions. Negative results must be combined with clinical observations, patient history,  and epidemiological information. The expected result is Negative. Fact Sheet for Patients: SugarRoll.be Fact Sheet for Healthcare Providers: https://www.woods-mathews.com/ This test is not yet approved or cleared by the [place] FDA and  has been authorized for detection and/or diagnosis of SARS-CoV-2 by FDA under an Emergency Use Authorization (EUA). This EUA will remain  in  effect (meaning this test can be used) for the duration of the COVID-19 declaration under Section 56 4(b)(1) of the Act, 21 U.S.C. section 360bbb-3(b)(1), unless the authorization is terminated or revoked sooner. Performed at [facility], [place] [address], [place], [place] [address]      Labs: BNP (last 3 results) Recent Labs    03/28/18 1634  BNP 993.7*   Basic Metabolic Panel: Recent Labs  Lab 03/15/19 1551 03/16/19 0420 03/17/19 0559 03/18/19 0617 03/19/19 0555  NA 136 138 148* 152* 144  K 3.8 3.6 3.0* 3.0* 3.8  CL 101 108 105 100 102  CO2 19* 20* 24 28 32  GLUCOSE 104* 85 79 83 122*  BUN 57* 52* 38* 33* 25*  CREATININE 2.31* 2.09* 1.63* 1.76* 1.66*  CALCIUM 7.6* 6.8* 7.5* 7.5* 7.1*  MG  --   --  1.6* 2.5* 2.0   Liver Function Tests: Recent Labs  Lab 03/15/19 1551 03/16/19 0420 03/17/19 0559 03/18/19 0617  AST 19 16 18 18   ALT 24 20 20 21   ALKPHOS 80 67 62 64  BILITOT 0.4 0.8 1.0 1.1  PROT 6.3* 5.3* 4.9* 5.3*  ALBUMIN 3.4* 2.7* 2.5* 2.7*   Recent Labs  Lab 03/15/19 1551  LIPASE 52*   No results for input(s): AMMONIA in the last 168 hours. CBC: Recent Labs  Lab 03/15/19 1551 03/16/19 0420 03/17/19 0559 03/18/19 0617 03/19/19 0555  WBC 10.9* 9.6 8.9 11.1* 8.9  HGB 10.1* 8.6* 9.0* 9.5* 7.8*  HCT 32.9* 28.7* 29.0* 31.5* 26.4*  MCV 95.1 97.6 94.8 97.2 99.6  PLT 350 246 278 348 283   Cardiac Enzymes: No results for input(s): CKTOTAL, CKMB, CKMBINDEX, TROPONINI in the last 168 hours. BNP: Invalid input(s): POCBNP CBG: Recent Labs  Lab 03/16/19 0827  GLUCAP 72   D-Dimer No results for input(s): DDIMER in the last 72 hours. Hgb A1c No results for input(s): HGBA1C in the last 72 hours. Lipid Profile No results for input(s): CHOL, HDL, LDLCALC, TRIG, CHOLHDL, LDLDIRECT in the last 72 hours. Thyroid function studies No results for input(s): TSH, T4TOTAL, T3FREE, THYROIDAB in the last 72 hours.  Invalid input(s): FREET3 Anemia work  up No results for input(s): VITAMINB12, FOLATE, FERRITIN, TIBC, IRON, RETICCTPCT in the last 72 hours. Urinalysis    Component Value Date/Time   COLORURINE YELLOW 03/15/2019 1640   APPEARANCEUR HAZY (A) 03/15/2019 1640   LABSPEC 1.015 03/15/2019 1640   PHURINE 5.0 03/15/2019 1640   GLUCOSEU NEGATIVE 03/15/2019 1640   HGBUR NEGATIVE 03/15/2019 1640   BILIRUBINUR NEGATIVE 03/15/2019 1640   KETONESUR NEGATIVE 03/15/2019 1640   PROTEINUR NEGATIVE 03/15/2019 1640   NITRITE NEGATIVE 03/15/2019 1640   LEUKOCYTESUR NEGATIVE 03/15/2019 1640   Sepsis Labs Invalid input(s): PROCALCITONIN,  WBC,  LACTICIDVEN Microbiology Recent Results (from the past 240 hour(s))  SARS CORONAVIRUS 2 (TAT 6-24 HRS) Nasopharyngeal Nasopharyngeal Swab     Status: None   Collection Time: 03/15/19  8:57 PM   Specimen: Nasopharyngeal Swab  Result Value Ref Range Status   SARS Coronavirus 2 NEGATIVE NEGATIVE Final    Comment: (NOTE) SARS-CoV-2 target nucleic acids are NOT DETECTED. The SARS-CoV-2 RNA is  generally detectable in upper and lower respiratory specimens during the acute phase of infection. Negative results do not preclude SARS-CoV-2 infection, do not rule out co-infections with other pathogens, and should not be used as the sole basis for treatment or other patient management decisions. Negative results must be combined with clinical observations, patient history, and epidemiological information. The expected result is Negative. Fact Sheet for Patients: SugarRoll.be Fact Sheet for Healthcare Providers: https://www.woods-mathews.com/ This test is not yet approved or cleared by the [place] FDA and  has been authorized for detection and/or diagnosis of SARS-CoV-2 by FDA under an Emergency Use Authorization (EUA). This EUA will remain  in effect (meaning this test can be used) for the duration of the COVID-19 declaration under Section 56 4(b)(1) of the  Act, 21 U.S.C. section 360bbb-3(b)(1), unless the authorization is terminated or revoked sooner. Performed at [facility], [place] [address], [place], [place] [address]      Time coordinating discharge: 35 minutes  SIGNED:   Rodena Goldmann, DO [facility] 03/19/2019, 10:41 AM  If 7PM-7AM, please contact night-coverage [email or web address]

## 2019-03-21 ENCOUNTER — Other Ambulatory Visit: Payer: Self-pay

## 2019-03-21 ENCOUNTER — Emergency Department (HOSPITAL_COMMUNITY): Payer: PPO

## 2019-03-21 ENCOUNTER — Inpatient Hospital Stay (HOSPITAL_COMMUNITY)
Admission: EM | Admit: 2019-03-21 | Discharge: 2019-04-03 | DRG: 329 | Disposition: A | Discharge status: Skilled Nursing Facility | Payer: PPO | Attending: Family Medicine | Admitting: Family Medicine

## 2019-03-21 ENCOUNTER — Encounter (HOSPITAL_COMMUNITY): Payer: Self-pay

## 2019-03-21 DIAGNOSIS — J1282 Pneumonia due to coronavirus disease 2019: Secondary | ICD-10-CM | POA: Diagnosis not present

## 2019-03-21 DIAGNOSIS — J189 Pneumonia, unspecified organism: Secondary | ICD-10-CM | POA: Diagnosis not present

## 2019-03-21 DIAGNOSIS — J9621 Acute and chronic respiratory failure with hypoxia: Secondary | ICD-10-CM | POA: Diagnosis not present

## 2019-03-21 DIAGNOSIS — E785 Hyperlipidemia, unspecified: Secondary | ICD-10-CM | POA: Diagnosis present

## 2019-03-21 DIAGNOSIS — U071 COVID-19: Secondary | ICD-10-CM | POA: Diagnosis not present

## 2019-03-21 DIAGNOSIS — E860 Dehydration: Secondary | ICD-10-CM | POA: Diagnosis present

## 2019-03-21 DIAGNOSIS — Z906 Acquired absence of other parts of urinary tract: Secondary | ICD-10-CM

## 2019-03-21 DIAGNOSIS — E1142 Type 2 diabetes mellitus with diabetic polyneuropathy: Secondary | ICD-10-CM | POA: Diagnosis present

## 2019-03-21 DIAGNOSIS — R5381 Other malaise: Secondary | ICD-10-CM | POA: Diagnosis present

## 2019-03-21 DIAGNOSIS — R918 Other nonspecific abnormal finding of lung field: Secondary | ICD-10-CM | POA: Diagnosis present

## 2019-03-21 DIAGNOSIS — M7989 Other specified soft tissue disorders: Secondary | ICD-10-CM | POA: Diagnosis not present

## 2019-03-21 DIAGNOSIS — Z9981 Dependence on supplemental oxygen: Secondary | ICD-10-CM

## 2019-03-21 DIAGNOSIS — C801 Malignant (primary) neoplasm, unspecified: Secondary | ICD-10-CM | POA: Diagnosis not present

## 2019-03-21 DIAGNOSIS — R195 Other fecal abnormalities: Secondary | ICD-10-CM | POA: Diagnosis present

## 2019-03-21 DIAGNOSIS — Z9049 Acquired absence of other specified parts of digestive tract: Secondary | ICD-10-CM

## 2019-03-21 DIAGNOSIS — R7303 Prediabetes: Secondary | ICD-10-CM

## 2019-03-21 DIAGNOSIS — K56609 Unspecified intestinal obstruction, unspecified as to partial versus complete obstruction: Secondary | ICD-10-CM

## 2019-03-21 DIAGNOSIS — K56600 Partial intestinal obstruction, unspecified as to cause: Secondary | ICD-10-CM | POA: Diagnosis not present

## 2019-03-21 DIAGNOSIS — K219 Gastro-esophageal reflux disease without esophagitis: Secondary | ICD-10-CM | POA: Diagnosis present

## 2019-03-21 DIAGNOSIS — Z515 Encounter for palliative care: Secondary | ICD-10-CM

## 2019-03-21 DIAGNOSIS — E1122 Type 2 diabetes mellitus with diabetic chronic kidney disease: Secondary | ICD-10-CM | POA: Diagnosis present

## 2019-03-21 DIAGNOSIS — R1111 Vomiting without nausea: Secondary | ICD-10-CM | POA: Diagnosis not present

## 2019-03-21 DIAGNOSIS — Z960 Presence of urogenital implants: Secondary | ICD-10-CM | POA: Diagnosis present

## 2019-03-21 DIAGNOSIS — I10 Essential (primary) hypertension: Secondary | ICD-10-CM | POA: Diagnosis not present

## 2019-03-21 DIAGNOSIS — C662 Malignant neoplasm of left ureter: Secondary | ICD-10-CM

## 2019-03-21 DIAGNOSIS — R112 Nausea with vomiting, unspecified: Secondary | ICD-10-CM | POA: Diagnosis not present

## 2019-03-21 DIAGNOSIS — D509 Iron deficiency anemia, unspecified: Secondary | ICD-10-CM | POA: Diagnosis not present

## 2019-03-21 DIAGNOSIS — Z888 Allergy status to other drugs, medicaments and biological substances status: Secondary | ICD-10-CM

## 2019-03-21 DIAGNOSIS — D649 Anemia, unspecified: Secondary | ICD-10-CM

## 2019-03-21 DIAGNOSIS — C786 Secondary malignant neoplasm of retroperitoneum and peritoneum: Secondary | ICD-10-CM | POA: Diagnosis present

## 2019-03-21 DIAGNOSIS — Z923 Personal history of irradiation: Secondary | ICD-10-CM

## 2019-03-21 DIAGNOSIS — K760 Fatty (change of) liver, not elsewhere classified: Secondary | ICD-10-CM | POA: Diagnosis not present

## 2019-03-21 DIAGNOSIS — Z85038 Personal history of other malignant neoplasm of large intestine: Secondary | ICD-10-CM

## 2019-03-21 DIAGNOSIS — I129 Hypertensive chronic kidney disease with stage 1 through stage 4 chronic kidney disease, or unspecified chronic kidney disease: Secondary | ICD-10-CM | POA: Diagnosis present

## 2019-03-21 DIAGNOSIS — M810 Age-related osteoporosis without current pathological fracture: Secondary | ICD-10-CM | POA: Diagnosis present

## 2019-03-21 DIAGNOSIS — J9611 Chronic respiratory failure with hypoxia: Secondary | ICD-10-CM | POA: Diagnosis present

## 2019-03-21 DIAGNOSIS — Z90710 Acquired absence of both cervix and uterus: Secondary | ICD-10-CM

## 2019-03-21 DIAGNOSIS — C784 Secondary malignant neoplasm of small intestine: Principal | ICD-10-CM | POA: Diagnosis present

## 2019-03-21 DIAGNOSIS — J069 Acute upper respiratory infection, unspecified: Secondary | ICD-10-CM | POA: Diagnosis not present

## 2019-03-21 DIAGNOSIS — N179 Acute kidney failure, unspecified: Secondary | ICD-10-CM | POA: Diagnosis present

## 2019-03-21 DIAGNOSIS — R159 Full incontinence of feces: Secondary | ICD-10-CM | POA: Diagnosis present

## 2019-03-21 DIAGNOSIS — N184 Chronic kidney disease, stage 4 (severe): Secondary | ICD-10-CM

## 2019-03-21 DIAGNOSIS — G629 Polyneuropathy, unspecified: Secondary | ICD-10-CM

## 2019-03-21 DIAGNOSIS — K660 Peritoneal adhesions (postprocedural) (postinfection): Secondary | ICD-10-CM | POA: Diagnosis not present

## 2019-03-21 DIAGNOSIS — Z79899 Other long term (current) drug therapy: Secondary | ICD-10-CM

## 2019-03-21 DIAGNOSIS — I779 Disorder of arteries and arterioles, unspecified: Secondary | ICD-10-CM

## 2019-03-21 DIAGNOSIS — C7989 Secondary malignant neoplasm of other specified sites: Secondary | ICD-10-CM | POA: Diagnosis not present

## 2019-03-21 DIAGNOSIS — Z853 Personal history of malignant neoplasm of breast: Secondary | ICD-10-CM

## 2019-03-21 DIAGNOSIS — R110 Nausea: Secondary | ICD-10-CM | POA: Diagnosis not present

## 2019-03-21 DIAGNOSIS — E119 Type 2 diabetes mellitus without complications: Secondary | ICD-10-CM | POA: Diagnosis not present

## 2019-03-21 DIAGNOSIS — C180 Malignant neoplasm of cecum: Secondary | ICD-10-CM

## 2019-03-21 DIAGNOSIS — R197 Diarrhea, unspecified: Secondary | ICD-10-CM | POA: Diagnosis not present

## 2019-03-21 DIAGNOSIS — E876 Hypokalemia: Secondary | ICD-10-CM | POA: Diagnosis present

## 2019-03-21 DIAGNOSIS — Z87891 Personal history of nicotine dependence: Secondary | ICD-10-CM

## 2019-03-21 DIAGNOSIS — K56699 Other intestinal obstruction unspecified as to partial versus complete obstruction: Secondary | ICD-10-CM | POA: Diagnosis not present

## 2019-03-21 DIAGNOSIS — Z7189 Other specified counseling: Secondary | ICD-10-CM | POA: Diagnosis not present

## 2019-03-21 DIAGNOSIS — D500 Iron deficiency anemia secondary to blood loss (chronic): Secondary | ICD-10-CM | POA: Diagnosis present

## 2019-03-21 DIAGNOSIS — Z66 Do not resuscitate: Secondary | ICD-10-CM | POA: Diagnosis present

## 2019-03-21 DIAGNOSIS — R109 Unspecified abdominal pain: Secondary | ICD-10-CM

## 2019-03-21 DIAGNOSIS — K7689 Other specified diseases of liver: Secondary | ICD-10-CM | POA: Diagnosis present

## 2019-03-21 DIAGNOSIS — K922 Gastrointestinal hemorrhage, unspecified: Secondary | ICD-10-CM

## 2019-03-21 DIAGNOSIS — J449 Chronic obstructive pulmonary disease, unspecified: Secondary | ICD-10-CM | POA: Diagnosis present

## 2019-03-21 DIAGNOSIS — Z881 Allergy status to other antibiotic agents status: Secondary | ICD-10-CM

## 2019-03-21 DIAGNOSIS — Z800 Family history of malignant neoplasm of digestive organs: Secondary | ICD-10-CM | POA: Diagnosis not present

## 2019-03-21 DIAGNOSIS — Z20822 Contact with and (suspected) exposure to covid-19: Secondary | ICD-10-CM | POA: Diagnosis not present

## 2019-03-21 DIAGNOSIS — Z7401 Bed confinement status: Secondary | ICD-10-CM | POA: Diagnosis not present

## 2019-03-21 DIAGNOSIS — Z885 Allergy status to narcotic agent status: Secondary | ICD-10-CM

## 2019-03-21 DIAGNOSIS — R58 Hemorrhage, not elsewhere classified: Secondary | ICD-10-CM | POA: Diagnosis not present

## 2019-03-21 LAB — PROTIME-INR
INR: 0.9 (ref 0.8–1.2)
Prothrombin Time: 12.2 seconds (ref 11.4–15.2)

## 2019-03-21 LAB — CBC WITH DIFFERENTIAL/PLATELET
Abs Immature Granulocytes: 0.04 10*3/uL (ref 0.00–0.07)
Basophils Absolute: 0 10*3/uL (ref 0.0–0.1)
Basophils Relative: 0 %
Eosinophils Absolute: 0 10*3/uL (ref 0.0–0.5)
Eosinophils Relative: 0 %
HCT: 25.1 % — ABNORMAL LOW (ref 36.0–46.0)
Hemoglobin: 7.7 g/dL — ABNORMAL LOW (ref 12.0–15.0)
Immature Granulocytes: 1 %
Lymphocytes Relative: 14 %
Lymphs Abs: 1.1 10*3/uL (ref 0.7–4.0)
MCH: 30.2 pg (ref 26.0–34.0)
MCHC: 30.7 g/dL (ref 30.0–36.0)
MCV: 98.4 fL (ref 80.0–100.0)
Monocytes Absolute: 0.3 10*3/uL (ref 0.1–1.0)
Monocytes Relative: 4 %
Neutro Abs: 6.3 10*3/uL (ref 1.7–7.7)
Neutrophils Relative %: 81 %
Platelets: 268 10*3/uL (ref 150–400)
RBC: 2.55 MIL/uL — ABNORMAL LOW (ref 3.87–5.11)
RDW: 15.3 % (ref 11.5–15.5)
WBC: 7.8 10*3/uL (ref 4.0–10.5)
nRBC: 0 % (ref 0.0–0.2)

## 2019-03-21 LAB — COMPREHENSIVE METABOLIC PANEL
ALT: 23 U/L (ref 0–44)
AST: 25 U/L (ref 15–41)
Albumin: 2.3 g/dL — ABNORMAL LOW (ref 3.5–5.0)
Alkaline Phosphatase: 58 U/L (ref 38–126)
Anion gap: 12 (ref 5–15)
BUN: 41 mg/dL — ABNORMAL HIGH (ref 8–23)
CO2: 26 mmol/L (ref 22–32)
Calcium: 7.5 mg/dL — ABNORMAL LOW (ref 8.9–10.3)
Chloride: 104 mmol/L (ref 98–111)
Creatinine, Ser: 2.01 mg/dL — ABNORMAL HIGH (ref 0.44–1.00)
GFR calc Af Amer: 25 mL/min — ABNORMAL LOW (ref >60)
GFR calc non Af Amer: 22 mL/min — ABNORMAL LOW (ref >60)
Glucose, Bld: 93 mg/dL (ref 70–99)
Potassium: 3.8 mmol/L (ref 3.5–5.1)
Sodium: 142 mmol/L (ref 135–145)
Total Bilirubin: 0.7 mg/dL (ref 0.3–1.2)
Total Protein: 4.5 g/dL — ABNORMAL LOW (ref 6.5–8.1)

## 2019-03-21 LAB — SARS CORONAVIRUS 2 (TAT 6-24 HRS): SARS Coronavirus 2: NEGATIVE

## 2019-03-21 LAB — TYPE AND SCREEN
ABO/RH(D): A POS
Antibody Screen: NEGATIVE

## 2019-03-21 LAB — POC OCCULT BLOOD, ED: Fecal Occult Bld: POSITIVE — AB

## 2019-03-21 LAB — LACTIC ACID, PLASMA: Lactic Acid, Venous: 0.5 mmol/L (ref 0.5–1.9)

## 2019-03-21 MED ORDER — SODIUM CHLORIDE 0.9 % IV BOLUS
500.0000 mL | Freq: Once | INTRAVENOUS | Status: AC
  Administered 2019-03-21: 500 mL via INTRAVENOUS

## 2019-03-21 MED ORDER — QUETIAPINE FUMARATE 25 MG PO TABS
25.0000 mg | ORAL_TABLET | Freq: Every day | ORAL | Status: DC
  Administered 2019-03-21 – 2019-03-26 (×6): 25 mg via ORAL
  Filled 2019-03-21 (×6): qty 1

## 2019-03-21 MED ORDER — ANASTROZOLE 1 MG PO TABS
1.0000 mg | ORAL_TABLET | Freq: Every evening | ORAL | Status: DC
  Administered 2019-03-21 – 2019-03-26 (×6): 1 mg via ORAL
  Filled 2019-03-21 (×7): qty 1

## 2019-03-21 MED ORDER — ONDANSETRON HCL 4 MG PO TABS: 4.0000 mg | ORAL_TABLET | Freq: Four times a day (QID) | ORAL | Status: DC | PRN

## 2019-03-21 MED ORDER — PANTOPRAZOLE SODIUM 40 MG IV SOLR
40.0000 mg | Freq: Two times a day (BID) | INTRAVENOUS | Status: DC
  Administered 2019-03-21 – 2019-03-24 (×7): 40 mg via INTRAVENOUS
  Filled 2019-03-21 (×8): qty 40

## 2019-03-21 MED ORDER — ALBUTEROL SULFATE 108 (90 BASE) MCG/ACT IN AEPB
1.0000 | INHALATION_SPRAY | RESPIRATORY_TRACT | Status: DC | PRN
  Filled 2019-03-21: qty 1

## 2019-03-21 MED ORDER — ACETAMINOPHEN 650 MG RE SUPP: 650.0000 mg | Freq: Four times a day (QID) | RECTAL | Status: DC | PRN

## 2019-03-21 MED ORDER — ONDANSETRON HCL 4 MG/2ML IJ SOLN
4.0000 mg | Freq: Once | INTRAMUSCULAR | Status: AC
  Administered 2019-03-21: 4 mg via INTRAVENOUS
  Filled 2019-03-21: qty 2

## 2019-03-21 MED ORDER — ONDANSETRON HCL 4 MG/2ML IJ SOLN
4.0000 mg | Freq: Four times a day (QID) | INTRAMUSCULAR | Status: DC | PRN
  Administered 2019-03-24 – 2019-03-26 (×2): 4 mg via INTRAVENOUS
  Filled 2019-03-21 (×4): qty 2

## 2019-03-21 MED ORDER — ADULT MULTIVITAMIN W/MINERALS CH
1.0000 | ORAL_TABLET | Freq: Every day | ORAL | Status: DC
  Administered 2019-03-21 – 2019-03-26 (×6): 1 via ORAL
  Filled 2019-03-21 (×7): qty 1

## 2019-03-21 MED ORDER — HYDROCORTISONE (PERIANAL) 2.5 % EX CREA
1.0000 "application " | TOPICAL_CREAM | Freq: Two times a day (BID) | CUTANEOUS | Status: DC | PRN
  Filled 2019-03-21: qty 28.35

## 2019-03-21 MED ORDER — SODIUM CHLORIDE 0.9 % IV SOLN: INTRAVENOUS | Status: DC

## 2019-03-21 MED ORDER — AMLODIPINE BESYLATE 5 MG PO TABS
5.0000 mg | ORAL_TABLET | Freq: Every day | ORAL | Status: DC
  Administered 2019-03-22 – 2019-03-26 (×5): 5 mg via ORAL
  Filled 2019-03-21 (×7): qty 1

## 2019-03-21 MED ORDER — ACETAMINOPHEN 325 MG PO TABS: 650.0000 mg | ORAL_TABLET | Freq: Four times a day (QID) | ORAL | Status: DC | PRN

## 2019-03-21 NOTE — H&P (Signed)
History and Physical  [facility]  [name] RJJ:[id number] DOB: 01/04/1929 DOA: 03/21/2019  PCP: Babs Sciara, MD  Patient coming from: Home-lives alone  I have personally briefly reviewed patient's old medical records in [facility]  Chief Complaint: weakness   HPI: [name] is a 86 y.o. 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  She reports that she has been having difficulty eating and drinking and having nausea vomiting and diarrhea.  The patient reports that she has been having difficulty with ADLs at home because she has been so weak.  She has been too weak to go to see her outpatient provider.  She returned to the emergency department because she did not know what else to do.  She has some cramping in her abdomen but is not having the abdominal bloating and distention that she had when she was diagnosed with SBO.       ED Course: Patient reports that she feels very weak.  Patient has a temperature of 97.3, pulse 82, blood pressure 122/63, pulse ox 98% on room air.  Patient was sent for abdominal x-rays but there were no findings of small bowel obstruction and there was a normal bowel gas pattern.  Patient was strongly Hemoccult positive.  Her hemoglobin has drifted down in the last 2 weeks to 7.7 down from 10.1 in late December 2020.  Patient was given a bolus of IV fluids and GI was consulted.  Review of  Systems: As per HPI otherwise 10 point review of systems negative.   Past Medical History:  Diagnosis Date  . Breast cancer (HCC)   . Breast cancer, right breast (HCC) 05/01/2015  . Cancer (HCC)    BREAST CANCER   . Colon polyps   . COPD (chronic obstructive pulmonary disease) (HCC)    DR. Kendrick Fries  . Diabetes mellitus without complication (HCC)    PREDIABETIC  CONTROL W/ DIET  . Hypercholesteremia   . Hypertension   . Lung nodules   . Osteoarthritis   . Osteopenia 09/18/2013  . Osteoporosis   . Personal history of radiation therapy   . PONV (postoperative nausea and vomiting)    ' i get so sick with anesthesia"   . Shortness of breath dyspnea    W/ EXERTION  . Tick bite    3 years ago     Past Surgical History:  Procedure Laterality Date  . ABDOMINAL HYSTERECTOMY    . APPENDECTOMY    . BARTHOLIN GLAND CYST EXCISION    . BIOPSY  12/06/2018   Procedure: BIOPSY;  Surgeon: Malissa Hippo, MD;  [facility];  Service: Endoscopy;;  cecal mass biopsies   . BREAST EXCISIONAL BIOPSY Left    benign  . BREAST LUMPECTOMY WITH RADIOACTIVE SEED LOCALIZATION Right 05/23/2015   Procedure: RIGHT BREAST LUMPECTOMY WITH RADIOACTIVE SEED LOCALIZATION;  Surgeon: Claud Kelp, MD;  Location: [facility] OR;  Service: General;  Laterality: Right;  . BREAST SURGERY     2000  LEFT BREAST  LUMP REMOVED  . cataract  surgeries  Bilateral   . COLON RESECTION N/A 12/08/2018   Procedure: OPEN SEGMENTAL COLON RESECTION;  Surgeon: Claud Kelp, MD;  Location: [facility];  Service: General;  Laterality: N/A;  . colonoscopy    . COLONOSCOPY  03/25/2011   Procedure: COLONOSCOPY;  Surgeon: Malissa Hippo, MD;  Location: [facility];  Service: Endoscopy;  Laterality: N/A;  9:30 am  . COLONOSCOPY N/A 12/06/2018   Procedure: COLONOSCOPY;  Surgeon: Malissa Hippo, MD;  [facility];  Service: Endoscopy;  Laterality: N/A;  . CYSTOSCOPY W/ URETERAL STENT PLACEMENT Left 02/27/2018   Procedure:  CYSTOSCOPY WITH LEFT RETROGRADE PYELOGRAM AND lEFT URETEROSCOPY WITH BIOPSY AND LEFT URETERAL STENT PLACEMENT;  Surgeon: Malen Gauze, MD;  Location: [facility];  Service: Urology;  Laterality: Left;  . CYSTOSCOPY W/ URETERAL STENT PLACEMENT Right 12/08/2018   Procedure: CYSTOSCOPY WITH RETROGRADE PYELOGRAM/URETERAL STENT PLACEMENT;  Surgeon: Heloise Purpura, MD;  Location: [facility];  Service: Urology;  Laterality: Right;  . left inguinal hernia repair    . multiple toe surgeries    . ROBOT ASSITED LAPAROSCOPIC NEPHROURETERECTOMY Left 05/11/2018   Procedure: XI ROBOT ASSITED LAPAROSCOPIC LEFT DISTAL URETERECTOMY, URETERAL  REIMPLANTATION, WITH PSOAS HITCH AND BOARI FLAP. PELVIC LYMPH NODE DISSCETION AND LEFT URETERAL STENT PLACEMENT;  Surgeon: Malen Gauze, MD;  Location: [facility];  Service: Urology;  Laterality: Left;  . TONSILLECTOMY       reports that she quit smoking about 31 years ago. Her smoking use included cigarettes. She has a 100.00 pack-year smoking history. She has never used smokeless tobacco. She reports current alcohol use. She reports that she does not use drugs.  Allergies  Allergen Reactions  . Augmentin [Amoxicillin-Pot Clavulanate] Nausea And Vomiting    Has patient had a PCN reaction causing immediate rash, facial/tongue/throat swelling, SOB or lightheadedness with hypotension: No Has patient had a PCN reaction causing severe rash involving mucus membranes or skin necrosis: No Has patient had a PCN reaction that required hospitalization: No Has patient had a PCN reaction occurring within the last 10 years: Unknown If all of the above answers are "NO", then may proceed with Cephalosporin use.    Marland Kitchen Amoxicillin Diarrhea    Has patient had a PCN reaction causing immediate rash, facial/tongue/throat swelling, SOB or lightheadedness with hypotension: No Has patient had a PCN reaction causing severe rash involving mucus membranes or skin necrosis: No Has patient had a PCN  reaction that required hospitalization: No Has patient had a PCN reaction occurring within the last 10 years: Unknown If all of the above answers are "NO", then may proceed with Cephalosporin use.   . Codeine Nausea And Vomiting  . Morphine Nausea And Vomiting  . Neomycin-Bacitracin Zn-Polymyx Itching and Rash    Unable to use Neosporin    Family History  Problem Relation Age of Onset  . Colon cancer Father      Prior to Admission medications   Medication Sig Start Date End Date Taking? Authorizing Provider  amLODipine (NORVASC) 5 MG tablet TAKE ONE TABLET BY MOUTH ONCE DAILY. Patient taking differently: Take 5 mg by mouth daily.  11/14/18  Yes Babs Sciara, MD  anastrozole (ARIMIDEX) 1 MG tablet Take 1 tablet (1 mg total) by mouth daily. Patient taking differently: Take 1 mg by mouth every evening.  11/04/17  Yes Doreatha Massed, MD  fluticasone [facility]) 50 MCG/ACT nasal spray Place 2 sprays into both nostrils daily as needed for allergies.    Yes [provider]  furosemide (LASIX) 20 MG tablet Take 1 tablet (20 mg total) by mouth daily as needed for fluid. 03/12/19  Yes Casilda Pickerill L, MD  hydrocortisone (ANUSOL-HC) 2.5 % rectal cream Place 1 application rectally 2 (two) times daily as needed for hemorrhoids or anal itching.  12/27/18  Yes [provider]  levocetirizine (XYZAL) 5 MG tablet Take 5 mg by mouth daily as needed for allergies.  10/27/18  Yes [provider]  Multiple Vitamins-Minerals (MULTIVITAMIN WITH MINERALS) tablet Take 1 tablet by mouth daily. 12/15/18 12/15/19 Yes Almon Hercules, MD  nortriptyline (PAMELOR) 10 MG capsule TAKE 2 TO 3 CAPSULES BY MOUTH NIGHTLY. Patient taking differently: Take 10-20 mg by mouth at bedtime.  11/14/18  Yes Luking, Jonna Coup, MD  ondansetron (ZOFRAN) 4 MG tablet Take 1 tablet (4 mg total) by mouth every 6 (six) hours as needed for nausea. 03/19/19  Yes Shah, Pratik D, DO  OXYGEN Inhale 2 L into the lungs. As  needed for activities requiring exertion, per patient uses very sparingly   Yes [provider]  potassium chloride (KLOR-CON) 10 MEQ tablet Take one tablet bid on the days you take lasix ( furosemide) Patient taking differently: Take 10 mEq by mouth See admin instructions. Take one tablet bid on the days you take lasix ( furosemide) 12/28/18  Yes Luking, Jonna Coup, MD  PROAIR RESPICLICK 108 (90 Base) MCG/ACT AEPB INHALE 1 PUFF INTO THE LUNGS EVERY 4 HOURS AS NEEDED. Patient taking differently: Inhale 1 puff into the lungs every 4 (four) hours as needed (for shortness of breath/wheezing).  03/24/18  Yes Lupita Leash, MD    Physical Exam: Vitals:   03/21/19 1045 03/21/19 1100 03/21/19 1115 03/21/19 1130  BP:  (!) 132/57  105/82  Pulse: 75 80 75 76  Resp:      Temp:      TempSrc:      SpO2: 97% 100% 100% 98%  Weight:      Height:        Constitutional: NAD, calm, comfortable Eyes: PERRL, lids and conjunctivae normal ENMT: Mucous membranes are moist. Posterior pharynx clear of any exudate or lesions.Normal dentition.  Neck: normal, supple, no masses, no thyromegaly Respiratory: clear to auscultation bilaterally, no wheezing, no crackles. Normal respiratory effort. No accessory muscle use.  Cardiovascular: Regular rate and rhythm, no murmurs / rubs / gallops. No extremity edema. 2+ pedal pulses. No carotid bruits.  Abdomen: no tenderness, no masses palpated. No hepatosplenomegaly. Bowel sounds positive.  Musculoskeletal: no clubbing / cyanosis. No joint deformity upper and lower extremities. Good ROM, no contractures. Normal muscle tone.  Skin: no rashes, lesions, ulcers. No induration Neurologic: CN 2-12 grossly intact. Sensation intact, DTR normal. Strength 5/5 in all 4.  Psychiatric: Normal judgment and insight. Alert and oriented x 3. Normal mood.   Labs on Admission: I have personally reviewed following labs and imaging studies  CBC: Recent Labs  Lab 03/16/19 0420  03/17/19 0559 03/18/19 0617 03/19/19 0555 03/21/19 1050  WBC 9.6 8.9 11.1* 8.9 7.8  NEUTROABS  --   --   --   --  6.3  HGB 8.6* 9.0* 9.5* 7.8* 7.7*  HCT 28.7* 29.0* 31.5* 26.4* 25.1*  MCV 97.6 94.8 97.2 99.6 98.4  PLT 246 278 348 283 268   Basic Metabolic Panel: Recent Labs  Lab 03/16/19 0420 03/17/19 0559 03/18/19 0617 03/19/19 0555 03/21/19 1050  NA 138 148* 152* 144 142  K 3.6 3.0* 3.0* 3.8 3.8  CL 108 105 100 102 104  CO2 20* 24 28 32 26  GLUCOSE 85 79 83 122* 93  BUN 52* 38* 33* 25* 41*  CREATININE 2.09* 1.63* 1.76* 1.66* 2.01*  CALCIUM 6.8* 7.5* 7.5* 7.1* 7.5*  MG  --  1.6* 2.5* 2.0  --    GFR: Estimated Creatinine Clearance: 16 mL/min (A) (by C-G formula based on SCr of 2.01 mg/dL (H)). Liver Function Tests: Recent Labs  Lab 03/15/19 1551 03/16/19 0420 03/17/19 0559 03/18/19 0617 03/21/19 1050  AST 19 16 18 18 25   ALT 24 20 20 21 23   ALKPHOS 80 67 62 64 58  BILITOT 0.4 0.8 1.0 1.1 0.7  PROT 6.3* 5.3* 4.9* 5.3* 4.5*  ALBUMIN 3.4* 2.7* 2.5* 2.7* 2.3*   Recent Labs  Lab 03/15/19 1551  LIPASE 52*   No results for input(s): AMMONIA in the last 168 hours. Coagulation Profile: No results for input(s): INR, PROTIME in the last 168 hours. Cardiac Enzymes: No results for input(s): CKTOTAL, CKMB, CKMBINDEX, TROPONINI in the last 168 hours. BNP (last 3 results) No results for input(s): PROBNP in the last 8760 hours. HbA1C: No results for input(s): HGBA1C in the last 72 hours. CBG: Recent Labs  Lab 03/16/19 0827  GLUCAP 72   Lipid Profile: No results for input(s): CHOL, HDL, LDLCALC, TRIG, CHOLHDL, LDLDIRECT in the last 72 hours. Thyroid Function Tests: No results for input(s): TSH, T4TOTAL, FREET4, T3FREE, THYROIDAB in the last 72 hours. Anemia Panel: No results for input(s): VITAMINB12, FOLATE, FERRITIN, TIBC, IRON, RETICCTPCT in the last 72 hours. Urine analysis:    Component Value Date/Time   COLORURINE YELLOW 03/15/2019 1640   APPEARANCEUR  HAZY (A) 03/15/2019 1640   LABSPEC 1.015 03/15/2019 1640   PHURINE 5.0 03/15/2019 1640   GLUCOSEU NEGATIVE 03/15/2019 1640   HGBUR NEGATIVE 03/15/2019 1640   BILIRUBINUR NEGATIVE 03/15/2019 1640   KETONESUR NEGATIVE 03/15/2019 1640   PROTEINUR NEGATIVE 03/15/2019 1640   NITRITE NEGATIVE 03/15/2019 1640   LEUKOCYTESUR NEGATIVE 03/15/2019 1640    Radiological Exams on Admission: Acute Abd Series  Result Date: 03/21/2019 CLINICAL DATA:  Nausea, vomiting, diarrhea, abdominal pain EXAM: DG ABDOMEN ACUTE W/ 1V CHEST COMPARISON:  03/09/2019 FINDINGS: There is no evidence of dilated bowel loops or free intraperitoneal air. Heart size and mediastinal contours are within normal limits. Both lungs are clear. IMPRESSION: Negative abdominal radiographs.  No acute cardiopulmonary disease. Electronically Signed   By: Guadlupe Spanish M.D.   On: 03/21/2019 08:52   Assessment/Plan Principal Problem:   Guaiac positive stools Active Problems:   Carotid artery disease (HCC)   Hyperlipidemia   Peripheral neuropathy   Prediabetes   COPD (chronic obstructive pulmonary disease) (HCC)   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   CKD (chronic kidney disease), stage IV (HCC)   Iron deficiency anemia due to chronic blood loss   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)   S/P colon resection   Lower GI bleed from cecal cancer   DNR (do not resuscitate)   1. Mild dehydration-patient has had poor oral intake since being at home.  She is also had diarrhea and nausea and vomiting.  She has a mild AKI.  Treating with IV fluid hydration.  Monitor electrolytes closely. 2. Nausea vomiting diarrhea-she has been placed on enteric precautions.  No recent antibiotic use.  Suspect this is related to hemicolectomy.  Continue to monitor. 3. Guaiac positive stools-patient has has a hemoglobin that is slowly trending down over the  last 2 weeks.  She is not on any anticoagulants at this time.  She is heme  positive.  GI is going to see her.  With her history of colon cancer and it appears that the cancer was removed with clear margins in September 2020.  Type and screen and follow. 4. Iron deficiency anemia due to chronic blood loss-type and screen and transfuse if needed. 5. COPD-stable resume home bronchodilators. 6. Stage IV CKD-monitor creatinine daily with hydration. 7. DNR on admission-palliative medicine consult as patient needs to be at the end of life given multiple cancers and progressive debility and inability to care for herself at home. 8. Physical debility-PT evaluation requested.  DVT prophylaxis: SCDs Code Status: Full Family Communication:   Disposition Plan: admit for IV fluids and GI consult Consults called:   Admission status:  INP  Demar Shad MD [facility] How to contact the [facility] Attending or Consulting provider 7A - 7P or covering provider during after hours 7P -7A, for this patient?  1. Check the care team in [facility] [place] and look for a) attending/consulting [facility] provider listed and b) the [facility] team listed 2. Log into [email or web address] and use [place]'s universal password to access. If you do not have the password, please contact the hospital operator. 3. Locate the [facility] provider you are looking for under [facility] and page to a number that you can be directly reached. 4. If you still have difficulty reaching the provider, please page the [facility] (Director on Call) for the Hospitalists listed on amion for assistance.   If 7PM-7AM, please contact night-coverage [email or web address] Password [email or web address]  03/21/2019, 1:59 PM

## 2019-03-21 NOTE — ED Triage Notes (Signed)
Pt reports admitted recentl for SBO.  Pt says has had n/v/d since yesterday.  C/O abd pain.

## 2019-03-21 NOTE — ED Notes (Signed)
Placed external catheter on patient. 

## 2019-03-21 NOTE — Consult Note (Signed)
Referring Provider: Irwin Brakeman, MD Primary Care Physician:  Kathyrn Drown, MD Primary Gastroenterologist:  Dr. 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 Fanny Skates.  Patient is recovery has been slow.  She was seen by Dr. Delton Coombes of oncology service and opted observation instead of chemotherapy.  She had evidence of lymphovascular infiltration but all 16 lymph nodes were negative.  Patient states that she has remained with intermittent diarrhea and has continued to lose weight despite fair appetite.  Patient presented to emergency room on Christmas eve with 1 week history of abdominal pain nausea and vomiting.  She was found to have small bowel obstruction with transition zone in the right lower quadrant.  Ileocolonic anastomosis was felt to be patent.  Small bowel was felt to be secondary to adhesions.  She did not have adenopathy or evidence of hepatic metastatic disease.  She was seen in consultation by Dr. Constance Haw of surgical service.  She was treated with IV fluids and NG tube was placed.  She responded to medical treatment and was discharged 4 days later on 03/12/2019 but returned 3 days later on New Year's Eve with recurrent lower abdominal pain.  CT scan was repeated and and once again revealed findings consistent with small bowel obstruction.  Patient was hospitalized NG tube was placed for decompression.  She was seen in consultation by Dr. Arnoldo Morale of surgical service.  Once again patient improved with medical therapy and was able to go home 2 days ago. Patient says she did fine the day she was discharged.  She actually was able to eat hamburger. Yesterday morning when she woke up she did not feel well.  She took some liquids at breakfast and lunch.  Around 6 PM yesterday she began  to vomit.  She had multiple episodes.  She vomited bilious fluid.  She also began to have loose watery stools.  She did not experience hematemesis melena or rectal bleeding.  Patient was brought to emergency room by EMS this morning.  Lab studies are pertinent for hemoglobin of 7.7 g and hematocrit of 25.1.  Hemoglobin prior to discharge was 7.8 g.  Electrolytes were normal but BUN was 41 and creatinine 2.01.  Serum albumin was 2.3.  BUN and creatinine were 25 and 1.662 days ago prior to discharge.  Serum lactate level was normal.  Acute abdominal series did not reveal changes of small bowel obstruction.  Patient's stool was noted to be strongly heme positive. Patient has been admitted to Dr. Wynetta Emery service. Patient states she has lost several pounds.  She states her baseline weight was 173 pounds.  Review of her records revealed that she has lost 22 pounds in the last 13 weeks. She denies heartburn chest pain shortness of breath fever or chills.  She has been experienced pain across the lower abdomen but primarily in right lower quadrant.  She is presently pain-free.  She has not experienced vomiting or diarrhea since she has been in the emergency room.  She has been passing flatus. She does not take OTC NSAIDs. Patient lives alone.  She does not have any children.  She worked at a [employer], academic.  She has been retired for many years.  She does not smoke cigarettes or drink alcohol. Family history significant for CRC in her father and first cousin.  Her father was in his late 6s at the time of diagnosis and lived 3 years.  Her first cousin was late 76s who died few years later of metastatic disease.   Past Medical History:  Diagnosis Date  . Breast cancer ([place])   . Breast cancer, right breast ([place]) 05/01/2015  . Cancer ([facility])    BREAST CANCER   . Colon polyps   . COPD (chronic obstructive pulmonary disease) ([place])    DR. [place]  . Diabetes mellitus without complication ([place])    PREDIABETIC  CONTROL  W/ DIET  . Hypercholesteremia   . Hypertension   . Lung nodules   . Osteoarthritis   . Osteopenia 09/18/2013  . Osteoporosis   . Personal history of radiation therapy   . PONV (postoperative nausea and vomiting)    ' i get so sick with anesthesia"   . Shortness of breath dyspnea    W/ EXERTION  . Tick bite    3 years ago     Past Surgical History:  Procedure Laterality Date  . ABDOMINAL HYSTERECTOMY    . APPENDECTOMY    . BARTHOLIN GLAND CYST EXCISION    . BIOPSY  12/06/2018   Procedure: BIOPSY;  Surgeon: Rogene Houston, MD;  [facility];  Service: Endoscopy;;  cecal mass biopsies   . BREAST EXCISIONAL BIOPSY Left    benign  . BREAST LUMPECTOMY WITH RADIOACTIVE SEED LOCALIZATION Right 05/23/2015   Procedure: RIGHT BREAST LUMPECTOMY WITH RADIOACTIVE SEED LOCALIZATION;  Surgeon: Fanny Skates, MD;  Location: [facility];  Service: General;  Laterality: Right;  . BREAST SURGERY     2000  LEFT BREAST  LUMP REMOVED  . cataract surgeries  Bilateral   . COLON RESECTION N/A 12/08/2018   Procedure: OPEN SEGMENTAL COLON RESECTION;  Surgeon: Fanny Skates, MD;  [facility]: [facility];  Service: General;  Laterality: N/A;  . colonoscopy    . COLONOSCOPY  03/25/2011   Procedure: COLONOSCOPY;  Surgeon: Rogene Houston, MD;  [facility];  Service: Endoscopy;  Laterality: N/A;  9:30 am  . COLONOSCOPY N/A 12/06/2018   Procedure: COLONOSCOPY;  Surgeon: Rogene Houston, MD;  [facility];  Service: Endoscopy;  Laterality: N/A;  . CYSTOSCOPY W/ URETERAL STENT PLACEMENT Left 02/27/2018   Procedure: CYSTOSCOPY WITH LEFT RETROGRADE PYELOGRAM AND lEFT URETEROSCOPY WITH BIOPSY AND LEFT URETERAL STENT PLACEMENT;  Surgeon: Cleon Gustin, MD;  Location: [facility];  Service: Urology;  Laterality: Left;  . CYSTOSCOPY W/ URETERAL STENT PLACEMENT Right 12/08/2018   Procedure: CYSTOSCOPY WITH RETROGRADE PYELOGRAM/URETERAL STENT PLACEMENT;  Surgeon: Raynelle Bring, MD;  [facility];  Service: Urology;  Laterality: Right;  . left inguinal hernia repair    . multiple toe surgeries    . ROBOT ASSITED LAPAROSCOPIC NEPHROURETERECTOMY Left 05/11/2018   Procedure: XI ROBOT ASSITED LAPAROSCOPIC LEFT DISTAL URETERECTOMY, URETERAL  REIMPLANTATION, WITH PSOAS HITCH AND BOARI FLAP. PELVIC LYMPH NODE DISSCETION AND LEFT URETERAL STENT PLACEMENT;  Surgeon: Cleon Gustin, MD;  [facility];  Service: Urology;  Laterality: Left;  . TONSILLECTOMY      Prior to Admission medications   Medication Sig Start Date End Date Taking? Authorizing Provider  amLODipine (NORVASC) 5 MG tablet TAKE ONE TABLET BY MOUTH ONCE DAILY. Patient taking differently: Take 5 mg by mouth daily.  11/14/18  Yes Kathyrn Drown, MD  anastrozole (ARIMIDEX) 1 MG tablet Take 1 tablet (1 mg total) by mouth daily. Patient taking differently: Take 1 mg  by mouth every evening.  11/04/17  Yes Derek Jack, MD  fluticasone [facility]) 50 MCG/ACT nasal spray Place 2 sprays into both nostrils daily as needed for allergies.    Yes [provider]  furosemide (LASIX) 20 MG tablet Take 1 tablet (20 mg total) by mouth daily as needed for fluid. 03/12/19  Yes Johnson, Clanford L, MD  hydrocortisone (ANUSOL-HC) 2.5 % rectal cream Place 1 application rectally 2 (two) times daily as needed for hemorrhoids or anal itching.  12/27/18  Yes [provider]  levocetirizine (XYZAL) 5 MG tablet Take 5 mg by mouth daily as needed for allergies.  10/27/18  Yes [provider]  Multiple Vitamins-Minerals (MULTIVITAMIN WITH MINERALS) tablet Take 1 tablet by mouth daily. 12/15/18 12/15/19 Yes Mercy Riding, MD  nortriptyline (PAMELOR) 10 MG capsule TAKE 2 TO 3 CAPSULES BY MOUTH NIGHTLY. Patient taking differently: Take 10-20 mg by mouth at bedtime.  11/14/18  Yes Luking, Elayne Snare, MD  ondansetron (ZOFRAN) 4 MG tablet Take 1 tablet (4 mg total) by mouth every 6 (six) hours as needed for nausea. 03/19/19  Yes Shah,  Pratik D, DO  OXYGEN Inhale 2 L into the lungs. As needed for activities requiring exertion, per patient uses very sparingly   Yes [provider]  potassium chloride (KLOR-CON) 10 MEQ tablet Take one tablet bid on the days you take lasix ( furosemide) Patient taking differently: Take 10 mEq by mouth See admin instructions. Take one tablet bid on the days you take lasix ( furosemide) 12/28/18  Yes Luking, Elayne Snare, MD  PROAIR RESPICLICK 956 (90 Base) MCG/ACT AEPB INHALE 1 PUFF INTO THE LUNGS EVERY 4 HOURS AS NEEDED. Patient taking differently: Inhale 1 puff into the lungs every 4 (four) hours as needed (for shortness of breath/wheezing).  03/24/18  Yes Juanito Doom, MD    No current facility-administered medications for this encounter.   Current Outpatient Medications  Medication Sig Dispense Refill  . amLODipine (NORVASC) 5 MG tablet TAKE ONE TABLET BY MOUTH ONCE DAILY. (Patient taking differently: Take 5 mg by mouth daily. ) 30 tablet 3  . anastrozole (ARIMIDEX) 1 MG tablet Take 1 tablet (1 mg total) by mouth daily. (Patient taking differently: Take 1 mg by mouth every evening. ) 90 tablet 3  . fluticasone (FLONASE) 50 MCG/ACT nasal spray Place 2 sprays into both nostrils daily as needed for allergies.     . furosemide (LASIX) 20 MG tablet Take 1 tablet (20 mg total) by mouth daily as needed for fluid.    . hydrocortisone (ANUSOL-HC) 2.5 % rectal cream Place 1 application rectally 2 (two) times daily as needed for hemorrhoids or anal itching.     . levocetirizine (XYZAL) 5 MG tablet Take 5 mg by mouth daily as needed for allergies.     . Multiple Vitamins-Minerals (MULTIVITAMIN WITH MINERALS) tablet Take 1 tablet by mouth daily. 120 tablet 2  . nortriptyline (PAMELOR) 10 MG capsule TAKE 2 TO 3 CAPSULES BY MOUTH NIGHTLY. (Patient taking differently: Take 10-20 mg by mouth at bedtime. ) 90 capsule 3  . ondansetron (ZOFRAN) 4 MG tablet Take 1 tablet (4 mg total) by mouth every 6 (six)  hours as needed for nausea. 20 tablet 0  . OXYGEN Inhale 2 L into the lungs. As needed for activities requiring exertion, per patient uses very sparingly    . potassium chloride (KLOR-CON) 10 MEQ tablet Take one tablet bid on the days you take lasix ( furosemide) (Patient taking  differently: Take 10 mEq by mouth See admin instructions. Take one tablet bid on the days you take lasix ( furosemide)) 60 tablet 0  . PROAIR RESPICLICK 778 (90 Base) MCG/ACT AEPB INHALE 1 PUFF INTO THE LUNGS EVERY 4 HOURS AS NEEDED. (Patient taking differently: Inhale 1 puff into the lungs every 4 (four) hours as needed (for shortness of breath/wheezing). ) 1 each 5    Allergies as of 03/21/2019 - Review Complete 03/21/2019  Allergen Reaction Noted  . Augmentin [amoxicillin-pot clavulanate] Nausea And Vomiting 10/07/2015  . Amoxicillin Diarrhea 10/16/2015  . Codeine Nausea And Vomiting 06/07/2007  . Morphine Nausea And Vomiting   . Neomycin-bacitracin zn-polymyx Itching and Rash     Family History  Problem Relation Age of Onset  . Colon cancer Father     Social History   Socioeconomic History  . Marital status: Widowed    Spouse name: Not on file  . Number of children: Not on file  . Years of education: Not on file  . Highest education level: Not on file  Occupational History  . Not on file  Tobacco Use  . Smoking status: Former Smoker    Packs/day: 2.00    Years: 50.00    Pack years: 100.00    Types: Cigarettes    Quit date: 03/15/1988    Years since quitting: 31.0  . Smokeless tobacco: Never Used  Substance and Sexual Activity  . Alcohol use: Yes    Alcohol/week: 0.0 standard drinks    Comment: seldom   . Drug use: No  . Sexual activity: Not on file  Other Topics Concern  . Not on file  Social History Narrative  . Not on file   Social Determinants of Health   Financial Resource Strain:   . Difficulty of Paying Living Expenses: Not on file  Food Insecurity: No Food Insecurity  . Worried  About Charity fundraiser in the Last Year: Never true  . Ran Out of Food in the Last Year: Never true  Transportation Needs: No Transportation Needs  . Lack of Transportation (Medical): No  . Lack of Transportation (Non-Medical): No  Physical Activity:   . Days of Exercise per Week: Not on file  . Minutes of Exercise per Session: Not on file  Stress:   . Feeling of Stress : Not on file  Social Connections:   . Frequency of Communication with Friends and Family: Not on file  . Frequency of Social Gatherings with Friends and Family: Not on file  . Attends Religious Services: Not on file  . Active Member of Clubs or Organizations: Not on file  . Attends Archivist Meetings: Not on file  . Marital Status: Not on file  Intimate Partner Violence:   . Fear of Current or Ex-Partner: Not on file  . Emotionally Abused: Not on file  . Physically Abused: Not on file  . Sexually Abused: Not on file    Review of Systems: See HPI, otherwise normal ROS  Physical Exam: Temp:  [97.3 F (36.3 C)] 97.3 F (36.3 C) (01/06 0750) Pulse Rate:  [72-82] 76 (01/06 1130) Resp:  [18] 18 (01/06 0750) BP: (105-132)/(51-82) 105/82 (01/06 1130) SpO2:  [94 %-100 %] 98 % (01/06 1130) Weight:  [55.2 kg] 55.2 kg (01/06 0747)   Patient was seen in emergency room. Patient is alert in no acute distress She has generalized muscle wasting. Conjunctiva is pale.  Sclerae nonicteric. Oropharyngeal mucosa is normal. No neck masses thyromegaly or lymphadenopathy  noted. Cardiac exam with regular rhythm normal S1 and S2.  Faint systolic murmur noted at aortic area. Auscultation of lungs revealed vesicular breath sounds bilaterally. Abdomen is full.  She has low midline scar.  Bowel sounds are normal.  On palpation abdomen is soft and nontender with no organomegaly or masses. Extremities are thin without clubbing.  She has trace edema around ankles.  Lab Results: Recent Labs    03/19/19 0555  03/21/19 1050  WBC 8.9 7.8  HGB 7.8* 7.7*  HCT 26.4* 25.1*  PLT 283 268   BMET Recent Labs    03/19/19 0555 03/21/19 1050  NA 144 142  K 3.8 3.8  CL 102 104  CO2 32 26  GLUCOSE 122* 93  BUN 25* 41*  CREATININE 1.66* 2.01*  CALCIUM 7.1* 7.5*   LFT Recent Labs    03/21/19 1050  PROT 4.5*  ALBUMIN 2.3*  AST 25  ALT 23  ALKPHOS 58  BILITOT 0.7   PT/INR No results for input(s): LABPROT, INR in the last 72 hours. Hepatitis Panel No results for input(s): HEPBSAG, HCVAB, HEPAIGM, HEPBIGM in the last 72 hours.  Studies/Results: Acute Abd Series  Result Date: 03/21/2019 CLINICAL DATA:  Nausea, vomiting, diarrhea, abdominal pain EXAM: DG ABDOMEN ACUTE W/ 1V CHEST COMPARISON:  03/09/2019 FINDINGS: There is no evidence of dilated bowel loops or free intraperitoneal air. Heart size and mediastinal contours are within normal limits. Both lungs are clear. IMPRESSION: Negative abdominal radiographs.  No acute cardiopulmonary disease. Electronically Signed   By: Macy Mis M.D.   On: 03/21/2019 08:52   CT images from 03/08/2019, 03/15/2019 and acute abdominal series from this morning reviewed.  CT images showed multiple dilated loops of small bowel with transition proximal to ileocolonic anastomosis. Acute abdominal series from this morning revealed nonspecific gas pattern.  Assessment;  Patient is 86 year old Caucasian female who presents with nausea vomiting and nonbloody diarrhea.  Patient's GI history significant for right hemicolectomy about 13 weeks ago for cecal carcinoma, 2 hospitalizations recently for small bowel obstruction responding to medical therapy.  She was just discharged 2 days ago following her second hospitalization for small bowel obstruction.  Acute abdominal series do not show any changes of small bowel obstruction.  Her symptoms are suggestive of gastroenteritis or she could have colitis given that she has diarrhea.  Stool studies have been  requested.  Anemia.  Patient's hemoglobin 2 weeks ago was 10.1 g.  2 days ago it was 7.8 g and now it is 7.7.  Given that she is dehydrated hemoglobin is expected to drop with hydration.  Her stool is guaiac positive but no history of overt GI bleed.  Differential diagnosis includes peptic ulcer disease but given history of abdominal pain and diarrhea she could have enteritis or colitis. No indication for urgent endoscopic evaluation.  Will empirically treat her with IV pantoprazole while work-up is ongoing.  History of colon carcinoma.  Status post open right hemicolectomy on 12/08/2018.  She has no evidence of residual disease.  CEA was mildly elevated at 12.8 prior to surgery and 2 months ago was down to 10.1.  She could have occult disease.  However I do not see that would be source of her symptoms.  Recommendations;  Begin clear liquids. Pantoprazole 40 mg IV every 12 hours. Wait for results of GI pathogen panel and C. difficile stool studies. We will also request fecal lactoferrin. CEA with next blood work. Serial H&H.  If hemoglobin drops below 7 g she  will need blood transfusion. Patient will be reevaluated in a.m.  Patient's nephew [name] was informed of patient's condition.   LOS: 0 days   Avin Gibbons  03/21/2019, 2:45 PM

## 2019-03-21 NOTE — ED Provider Notes (Signed)
[facility] EMERGENCY DEPARTMENT Provider Note   CSN: [id number] Arrival date & time: 03/21/19  0109     History Chief Complaint  Patient presents with  . Diarrhea    [name] is a 86 y.o. female with a history significant for diabetes, hypertension and partial colectomy secondary to colon CA who was admitted here for small bowel obstruction, discharged home just 2 days ago.  Her symptoms were improved at time of discharge, but once home she developed nausea, vomiting and uncontrolled diarrhea.  She reports she was up all last night with intermittent episodes of dark watery stools.  She has been unable to tolerate p.o. intake secondary to nausea and vomiting.  She denies abdominal distention, she does have intermittent abdominal cramping, but denies distention or pain suggesting a return of her bowel obstruction.  She was sent home with a prescription for Zofran, but she felt too weak to even get this filled.  She was able to eat a boiled egg last night which did not cause vomiting, but she suspects triggered her worsening diarrhea.  She has had no medications for her symptoms.  She does endorse generalized weakness, she does not report dizziness with standing, denies fevers.  Also no dysuria, hematuria, back pain.  The history is provided by the patient.       Past Medical History:  Diagnosis Date  . Breast cancer ([place])   . Breast cancer, right breast ([place]) 05/01/2015  . Cancer (HCC)    BREAST CANCER   . Colon polyps   . COPD (chronic obstructive pulmonary disease) ([place])    DR. [place]  . Diabetes mellitus without complication ([place])    PREDIABETIC  CONTROL W/ DIET  . Hypercholesteremia   . Hypertension   . Lung nodules   . Osteoarthritis   . Osteopenia 09/18/2013  . Osteoporosis   . Personal history of radiation therapy   . PONV (postoperative nausea and vomiting)    ' i get so sick with anesthesia"   . Shortness of breath dyspnea    W/ EXERTION  . Tick bite    3  years ago     Patient Active Problem List   Diagnosis Date Noted  . SBO (small bowel obstruction) ([place]) 03/08/2019  . 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 Lower GI bleed from cecal cancer 12/10/2018  . S/P colon resection 12/08/2018  . Iron deficiency anemia due to chronic blood loss   . 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 CKD (chronic kidney disease), stage IV ([place]) 12/05/2018  . Acute anemia 12/04/2018  . Ureteral mass 05/11/2018  . Bladder cancer ([place]) 03/20/2018  . 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 Chronic respiratory failure with hypoxia ([place]) 08/13/2016  . Pulmonary nodule 07/19/2016  . Renal bruit 06/29/2016  . Dyspnea 05/04/2016  . COPD (chronic obstructive pulmonary disease) ([place]) 04/26/2016  . Breast cancer, right breast ([place]) 05/01/2015  . Osteoarthritis of both knees 04/21/2015  . Acute bronchitis with COPD ([place]) 03/21/2015  . Abnormal CXR 01/23/2015  . Prediabetes 10/15/2014  . Osteopenia 09/18/2013  . Peripheral neuropathy 08/13/2013  . Hyperglycemia 08/13/2013  . Essential hypertension, benign 09/20/2012  . Carotid artery disease ([place]) 09/20/2012  . Hyperlipidemia 09/20/2012  . Arthritis of right knee 06/08/2007  . KNEE PAIN 06/08/2007    Past Surgical History:  Procedure Laterality Date  .  ABDOMINAL HYSTERECTOMY    . APPENDECTOMY    . BARTHOLIN GLAND CYST EXCISION    . BIOPSY  12/06/2018   Procedure: BIOPSY;  Surgeon: Rogene Houston, MD;  Location[facility];  Service: Endoscopy;;  cecal mass biopsies   . BREAST EXCISIONAL BIOPSY Left    benign  . BREAST LUMPECTOMY WITH RADIOACTIVE SEED LOCALIZATION Right 05/23/2015   Procedure: RIGHT BREAST LUMPECTOMY WITH RADIOACTIVE SEED LOCALIZATION;  Surgeon: Fanny Skates, MD;  Location: [facility];  Service: General;  Laterality: Right;  . BREAST SURGERY      2000  LEFT BREAST  LUMP REMOVED  . cataract surgeries  Bilateral   . COLON RESECTION N/A 12/08/2018   Procedure: OPEN SEGMENTAL COLON RESECTION;  Surgeon: Fanny Skates, MD;  Location: [facility];  Service: General;  Laterality: N/A;  . colonoscopy    . COLONOSCOPY  03/25/2011   Procedure: COLONOSCOPY;  Surgeon: Rogene Houston, MD;  Location: [facility];  Service: Endoscopy;  Laterality: N/A;  9:30 am  . COLONOSCOPY N/A 12/06/2018   Procedure: COLONOSCOPY;  Surgeon: Rogene Houston, MD;  [facility];  Service: Endoscopy;  Laterality: N/A;  . CYSTOSCOPY W/ URETERAL STENT PLACEMENT Left 02/27/2018   Procedure: CYSTOSCOPY WITH LEFT RETROGRADE PYELOGRAM AND lEFT URETEROSCOPY WITH BIOPSY AND LEFT URETERAL STENT PLACEMENT;  Surgeon: Cleon Gustin, MD;  Location: [facility];  Service: Urology;  Laterality: Left;  . CYSTOSCOPY W/ URETERAL STENT PLACEMENT Right 12/08/2018   Procedure: CYSTOSCOPY WITH RETROGRADE PYELOGRAM/URETERAL STENT PLACEMENT;  Surgeon: Raynelle Bring, MD;  Location: [facility];  Service: Urology;  Laterality: Right;  . left inguinal hernia repair    . multiple toe surgeries    . ROBOT ASSITED LAPAROSCOPIC NEPHROURETERECTOMY Left 05/11/2018   Procedure: XI ROBOT ASSITED LAPAROSCOPIC LEFT DISTAL URETERECTOMY, URETERAL  REIMPLANTATION, WITH PSOAS HITCH AND BOARI FLAP. PELVIC LYMPH NODE DISSCETION AND LEFT URETERAL STENT PLACEMENT;  Surgeon: Cleon Gustin, MD;  Location: [facility];  Service: Urology;  Laterality: Left;  . TONSILLECTOMY       OB History   No obstetric history on file.     Family History  Problem Relation Age of Onset  . Colon cancer Father     Social History   Tobacco Use  . Smoking status: Former Smoker    Packs/day: 2.00    Years: 50.00    Pack years: 100.00    Types: Cigarettes    Quit date: 03/15/1988    Years since quitting: 31.0  . Smokeless tobacco: Never Used  Substance Use Topics  . Alcohol use: Yes    Alcohol/week: 0.0 standard  drinks    Comment: seldom   . Drug use: No    Home Medications Prior to Admission medications   Medication Sig Start Date End Date Taking? Authorizing Provider  amLODipine (NORVASC) 5 MG tablet TAKE ONE TABLET BY MOUTH ONCE DAILY. Patient taking differently: Take 5 mg by mouth daily.  11/14/18  Yes Kathyrn Drown, MD  anastrozole (ARIMIDEX) 1 MG tablet Take 1 tablet (1 mg total) by mouth daily. Patient taking differently: Take 1 mg by mouth every evening.  11/04/17  Yes Derek Jack, MD  fluticasone [facility]) 50 MCG/ACT nasal spray Place 2 sprays into both nostrils daily as needed for allergies.    Yes [provider]  furosemide (LASIX) 20 MG tablet Take 1 tablet (20 mg total) by mouth daily as needed for fluid. 03/12/19  Yes Johnson, Clanford L, MD  hydrocortisone (ANUSOL-HC) 2.5 %  rectal cream Place 1 application rectally 2 (two) times daily as needed for hemorrhoids or anal itching.  12/27/18  Yes [provider]  levocetirizine (XYZAL) 5 MG tablet Take 5 mg by mouth daily as needed for allergies.  10/27/18  Yes [provider]  Multiple Vitamins-Minerals (MULTIVITAMIN WITH MINERALS) tablet Take 1 tablet by mouth daily. 12/15/18 12/15/19 Yes Mercy Riding, MD  nortriptyline (PAMELOR) 10 MG capsule TAKE 2 TO 3 CAPSULES BY MOUTH NIGHTLY. Patient taking differently: Take 10-20 mg by mouth at bedtime.  11/14/18  Yes Luking, Elayne Snare, MD  ondansetron (ZOFRAN) 4 MG tablet Take 1 tablet (4 mg total) by mouth every 6 (six) hours as needed for nausea. 03/19/19  Yes Shah, Pratik D, DO  OXYGEN Inhale 2 L into the lungs. As needed for activities requiring exertion, per patient uses very sparingly   Yes [provider]  potassium chloride (KLOR-CON) 10 MEQ tablet Take one tablet bid on the days you take lasix ( furosemide) Patient taking differently: Take 10 mEq by mouth See admin instructions. Take one tablet bid on the days you take lasix ( furosemide) 12/28/18  Yes  Luking, Elayne Snare, MD  PROAIR RESPICLICK 314 (90 Base) MCG/ACT AEPB INHALE 1 PUFF INTO THE LUNGS EVERY 4 HOURS AS NEEDED. Patient taking differently: Inhale 1 puff into the lungs every 4 (four) hours as needed (for shortness of breath/wheezing).  03/24/18  Yes Juanito Doom, MD    Allergies    Augmentin [amoxicillin-pot clavulanate], Amoxicillin, Codeine, Morphine, and Neomycin-bacitracin zn-polymyx  Review of Systems   Review of Systems  Constitutional: Positive for fatigue. Negative for chills and fever.  HENT: Negative for congestion and sore throat.   Eyes: Negative.   Respiratory: Negative for chest tightness and shortness of breath.   Cardiovascular: Negative for chest pain.  Gastrointestinal: Positive for abdominal pain, diarrhea, nausea and vomiting. Negative for abdominal distention.  Genitourinary: Negative.   Musculoskeletal: Negative for arthralgias, joint swelling and neck pain.  Skin: Negative.  Negative for rash and wound.  Neurological: Positive for weakness. Negative for dizziness, light-headedness, numbness and headaches.  Psychiatric/Behavioral: Negative.     Physical Exam Updated Vital Signs BP 105/82   Pulse 76   Temp (!) 97.3 F (36.3 C) (Oral)   Resp 18   Ht 5\' 3"  (1.6 m)   Wt 55.2 kg   SpO2 98%   BMI 21.56 kg/m   Physical Exam Vitals and nursing note reviewed.  Constitutional:      Appearance: She is well-developed.  HENT:     Head: Normocephalic and atraumatic.  Eyes:     Conjunctiva/sclera: Conjunctivae normal.  Cardiovascular:     Rate and Rhythm: Normal rate and regular rhythm.     Heart sounds: Normal heart sounds.  Pulmonary:     Effort: Pulmonary effort is normal.     Breath sounds: Normal breath sounds. No wheezing.  Abdominal:     General: Bowel sounds are decreased.     Palpations: Abdomen is soft. There is no fluid wave, hepatomegaly or mass.     Tenderness: There is no abdominal tenderness.     Comments: Abdomen is soft,  without distention, no increased tympany.  Bowel sounds are present but reduced throughout all quadrants.  Bruit appreciated right pelvis.  No guarding or rebound present.   Musculoskeletal:        General: Normal range of motion.     Cervical back: Normal range of motion.  Skin:  General: Skin is warm and dry.  Neurological:     Mental Status: She is alert.     ED Results / Procedures / Treatments   Labs (all labs ordered are listed, but only abnormal results are displayed) Labs Reviewed  CBC WITH DIFFERENTIAL/PLATELET - Abnormal; Notable for the following components:      Result Value   RBC 2.55 (*)    Hemoglobin 7.7 (*)    HCT 25.1 (*)    All other components within normal limits  COMPREHENSIVE METABOLIC PANEL - Abnormal; Notable for the following components:   BUN 41 (*)    Creatinine, Ser 2.01 (*)    Calcium 7.5 (*)    Total Protein 4.5 (*)    Albumin 2.3 (*)    GFR calc non Af Amer 22 (*)    GFR calc Af Amer 25 (*)    All other components within normal limits  SARS CORONAVIRUS 2 (TAT 6-24 HRS)  GI PATHOGEN PANEL BY PCR, STOOL  C DIFFICILE QUICK SCREEN W PCR REFLEX  LACTIC ACID, PLASMA  POC OCCULT BLOOD, ED  TYPE AND SCREEN   Bedside Hemoccult performed by me and is strongly Hemoccult positive.  EKG None  Radiology Acute Abd Series  Result Date: 03/21/2019 CLINICAL DATA:  Nausea, vomiting, diarrhea, abdominal pain EXAM: DG ABDOMEN ACUTE W/ 1V CHEST COMPARISON:  03/09/2019 FINDINGS: There is no evidence of dilated bowel loops or free intraperitoneal air. Heart size and mediastinal contours are within normal limits. Both lungs are clear. IMPRESSION: Negative abdominal radiographs.  No acute cardiopulmonary disease. Electronically Signed   By: Macy Mis M.D.   On: 03/21/2019 08:52    Procedures Procedures (including critical care time)  Medications Ordered in ED Medications  sodium chloride 0.9 % bolus 500 mL (0 mLs Intravenous Stopped 03/21/19 1152)   ondansetron (ZOFRAN) injection 4 mg (4 mg Intravenous Given 03/21/19 2992)    ED Course  I have reviewed the triage vital signs and the nursing notes.  Pertinent labs & imaging results that were available during my care of the patient were reviewed by me and considered in my medical decision making (see chart for details).    MDM Rules/Calculators/A&P                      Patient's results reviewed.  It is noted that she is significantly anemic dropping from a hemoglobin of 10.1-7.6 today since here admission here on 12/31.  She is strongly Hemoccult positive.  She was given Zofran here and nausea has resolved.  Discussed patient with Dr. Laural Golden who will follow patient but requests medical admission. Requested stool studies which were ordered.    Call placed to Dr. Wynetta Emery who accepts pt for admission. Screening covid ordered.    Final Clinical Impression(s) / ED Diagnoses Final diagnoses:  Nausea vomiting and diarrhea  Gastrointestinal hemorrhage, unspecified gastrointestinal hemorrhage type  Anemia, unspecified type    Rx / DC Orders ED Discharge Orders    None       Landis Martins 03/21/19 1404    Horton, Barbette Hair, MD 03/21/19 1554

## 2019-03-21 NOTE — ED Notes (Signed)
Fluids given to pt to drink. Tolerating well.

## 2019-03-22 ENCOUNTER — Encounter (INDEPENDENT_AMBULATORY_CARE_PROVIDER_SITE_OTHER): Payer: Self-pay

## 2019-03-22 ENCOUNTER — Encounter (HOSPITAL_COMMUNITY): Payer: Self-pay | Admitting: Family Medicine

## 2019-03-22 DIAGNOSIS — J9611 Chronic respiratory failure with hypoxia: Secondary | ICD-10-CM | POA: Diagnosis present

## 2019-03-22 DIAGNOSIS — Z7189 Other specified counseling: Secondary | ICD-10-CM

## 2019-03-22 DIAGNOSIS — M810 Age-related osteoporosis without current pathological fracture: Secondary | ICD-10-CM | POA: Diagnosis present

## 2019-03-22 DIAGNOSIS — K56609 Unspecified intestinal obstruction, unspecified as to partial versus complete obstruction: Secondary | ICD-10-CM | POA: Diagnosis not present

## 2019-03-22 DIAGNOSIS — R5381 Other malaise: Secondary | ICD-10-CM | POA: Diagnosis present

## 2019-03-22 DIAGNOSIS — R197 Diarrhea, unspecified: Secondary | ICD-10-CM

## 2019-03-22 DIAGNOSIS — I129 Hypertensive chronic kidney disease with stage 1 through stage 4 chronic kidney disease, or unspecified chronic kidney disease: Secondary | ICD-10-CM | POA: Diagnosis present

## 2019-03-22 DIAGNOSIS — D649 Anemia, unspecified: Secondary | ICD-10-CM | POA: Diagnosis not present

## 2019-03-22 DIAGNOSIS — K922 Gastrointestinal hemorrhage, unspecified: Secondary | ICD-10-CM | POA: Diagnosis present

## 2019-03-22 DIAGNOSIS — U071 COVID-19: Secondary | ICD-10-CM | POA: Diagnosis not present

## 2019-03-22 DIAGNOSIS — K7689 Other specified diseases of liver: Secondary | ICD-10-CM | POA: Diagnosis present

## 2019-03-22 DIAGNOSIS — K219 Gastro-esophageal reflux disease without esophagitis: Secondary | ICD-10-CM | POA: Diagnosis present

## 2019-03-22 DIAGNOSIS — C180 Malignant neoplasm of cecum: Secondary | ICD-10-CM | POA: Diagnosis not present

## 2019-03-22 DIAGNOSIS — R112 Nausea with vomiting, unspecified: Secondary | ICD-10-CM | POA: Diagnosis not present

## 2019-03-22 DIAGNOSIS — Z515 Encounter for palliative care: Secondary | ICD-10-CM

## 2019-03-22 DIAGNOSIS — R159 Full incontinence of feces: Secondary | ICD-10-CM | POA: Diagnosis present

## 2019-03-22 DIAGNOSIS — C786 Secondary malignant neoplasm of retroperitoneum and peritoneum: Secondary | ICD-10-CM | POA: Diagnosis present

## 2019-03-22 DIAGNOSIS — R918 Other nonspecific abnormal finding of lung field: Secondary | ICD-10-CM | POA: Diagnosis present

## 2019-03-22 DIAGNOSIS — N179 Acute kidney failure, unspecified: Secondary | ICD-10-CM | POA: Diagnosis present

## 2019-03-22 DIAGNOSIS — E1142 Type 2 diabetes mellitus with diabetic polyneuropathy: Secondary | ICD-10-CM | POA: Diagnosis present

## 2019-03-22 DIAGNOSIS — J449 Chronic obstructive pulmonary disease, unspecified: Secondary | ICD-10-CM | POA: Diagnosis present

## 2019-03-22 DIAGNOSIS — D500 Iron deficiency anemia secondary to blood loss (chronic): Secondary | ICD-10-CM | POA: Diagnosis present

## 2019-03-22 DIAGNOSIS — C784 Secondary malignant neoplasm of small intestine: Secondary | ICD-10-CM | POA: Diagnosis present

## 2019-03-22 DIAGNOSIS — E785 Hyperlipidemia, unspecified: Secondary | ICD-10-CM | POA: Diagnosis present

## 2019-03-22 DIAGNOSIS — N184 Chronic kidney disease, stage 4 (severe): Secondary | ICD-10-CM | POA: Diagnosis present

## 2019-03-22 DIAGNOSIS — J1282 Pneumonia due to coronavirus disease 2019: Secondary | ICD-10-CM | POA: Diagnosis not present

## 2019-03-22 DIAGNOSIS — J9621 Acute and chronic respiratory failure with hypoxia: Secondary | ICD-10-CM | POA: Diagnosis not present

## 2019-03-22 DIAGNOSIS — E860 Dehydration: Secondary | ICD-10-CM | POA: Diagnosis present

## 2019-03-22 DIAGNOSIS — Z66 Do not resuscitate: Secondary | ICD-10-CM | POA: Diagnosis present

## 2019-03-22 DIAGNOSIS — Z800 Family history of malignant neoplasm of digestive organs: Secondary | ICD-10-CM | POA: Diagnosis not present

## 2019-03-22 DIAGNOSIS — Z853 Personal history of malignant neoplasm of breast: Secondary | ICD-10-CM | POA: Diagnosis not present

## 2019-03-22 DIAGNOSIS — R195 Other fecal abnormalities: Secondary | ICD-10-CM | POA: Diagnosis present

## 2019-03-22 DIAGNOSIS — Z9049 Acquired absence of other specified parts of digestive tract: Secondary | ICD-10-CM | POA: Diagnosis not present

## 2019-03-22 DIAGNOSIS — E1122 Type 2 diabetes mellitus with diabetic chronic kidney disease: Secondary | ICD-10-CM | POA: Diagnosis present

## 2019-03-22 LAB — MAGNESIUM: Magnesium: 1.9 mg/dL (ref 1.7–2.4)

## 2019-03-22 LAB — CBC WITH DIFFERENTIAL/PLATELET
Abs Immature Granulocytes: 0.04 10*3/uL (ref 0.00–0.07)
Basophils Absolute: 0 10*3/uL (ref 0.0–0.1)
Basophils Relative: 0 %
Eosinophils Absolute: 0.1 10*3/uL (ref 0.0–0.5)
Eosinophils Relative: 2 %
HCT: 28.7 % — ABNORMAL LOW (ref 36.0–46.0)
Hemoglobin: 8.2 g/dL — ABNORMAL LOW (ref 12.0–15.0)
Immature Granulocytes: 1 %
Lymphocytes Relative: 14 %
Lymphs Abs: 1 10*3/uL (ref 0.7–4.0)
MCH: 29.5 pg (ref 26.0–34.0)
MCHC: 28.6 g/dL — ABNORMAL LOW (ref 30.0–36.0)
MCV: 103.2 fL — ABNORMAL HIGH (ref 80.0–100.0)
Monocytes Absolute: 0.4 10*3/uL (ref 0.1–1.0)
Monocytes Relative: 5 %
Neutro Abs: 5.6 10*3/uL (ref 1.7–7.7)
Neutrophils Relative %: 78 %
Platelets: 250 10*3/uL (ref 150–400)
RBC: 2.78 MIL/uL — ABNORMAL LOW (ref 3.87–5.11)
RDW: 15.5 % (ref 11.5–15.5)
WBC: 7.1 10*3/uL (ref 4.0–10.5)
nRBC: 0 % (ref 0.0–0.2)

## 2019-03-22 LAB — COMPREHENSIVE METABOLIC PANEL
ALT: 23 U/L (ref 0–44)
AST: 26 U/L (ref 15–41)
Albumin: 2.3 g/dL — ABNORMAL LOW (ref 3.5–5.0)
Alkaline Phosphatase: 60 U/L (ref 38–126)
Anion gap: 9 (ref 5–15)
BUN: 35 mg/dL — ABNORMAL HIGH (ref 8–23)
CO2: 25 mmol/L (ref 22–32)
Calcium: 7.4 mg/dL — ABNORMAL LOW (ref 8.9–10.3)
Chloride: 108 mmol/L (ref 98–111)
Creatinine, Ser: 1.81 mg/dL — ABNORMAL HIGH (ref 0.44–1.00)
GFR calc Af Amer: 28 mL/min — ABNORMAL LOW (ref >60)
GFR calc non Af Amer: 25 mL/min — ABNORMAL LOW (ref >60)
Glucose, Bld: 71 mg/dL (ref 70–99)
Potassium: 3.6 mmol/L (ref 3.5–5.1)
Sodium: 142 mmol/L (ref 135–145)
Total Bilirubin: 0.6 mg/dL (ref 0.3–1.2)
Total Protein: 4.5 g/dL — ABNORMAL LOW (ref 6.5–8.1)

## 2019-03-22 MED ORDER — ALBUTEROL SULFATE (2.5 MG/3ML) 0.083% IN NEBU: 2.5000 mg | INHALATION_SOLUTION | RESPIRATORY_TRACT | Status: DC | PRN

## 2019-03-22 MED ORDER — CYANOCOBALAMIN 1000 MCG/ML IJ SOLN
1000.0000 ug | Freq: Once | INTRAMUSCULAR | Status: AC
  Administered 2019-03-22: 1000 ug via INTRAMUSCULAR
  Filled 2019-03-22: qty 1

## 2019-03-22 NOTE — Plan of Care (Signed)
  Problem: Acute Rehab PT Goals(only PT should resolve) Goal: Pt Will Go Supine/Side To Sit Outcome: Progressing Flowsheets (Taken 03/22/2019 1434) Pt will go Supine/Side to Sit: with min guard assist Goal: Patient Will Transfer Sit To/From Stand Outcome: Progressing Flowsheets (Taken 03/22/2019 1434) Patient will transfer sit to/from stand: with min guard assist Goal: Pt Will Transfer Bed To Chair/Chair To Bed Outcome: Progressing Flowsheets (Taken 03/22/2019 1434) Pt will Transfer Bed to Chair/Chair to Bed: min guard assist Goal: Pt Will Ambulate Outcome: Progressing Flowsheets (Taken 03/22/2019 1434) Pt will Ambulate:  75 feet  with min guard assist  with minimal assist  with rolling walker  with cane   2:34 PM, 03/22/19 Lonell Grandchild, MPT Physical Therapist with [facility] [phone] office [phone] mobile phone

## 2019-03-22 NOTE — Consult Note (Signed)
Consultation Note Date: 03/22/2019   Patient Name: [name]  DOB: 05/10/1928  MRN: [id number]  Age / Sex: 86 y.o., female  PCP: Lauren Drown, MD Referring Physician: Murlean Iba, MD  Reason for Consultation: Establishing goals of care and Psychosocial/spiritual support  HPI/Patient Profile: 86 y.o. 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   Clinical Assessment and Goals of Care: I have reviewed medical records including EPIC notes, labs and imaging, assessed the patient and then met at the bedside to discuss diagnosis prognosis, GOC, EOL wishes, disposition and options.  I introduced Palliative Medicine as specialized medical care for people living with serious illness. It focuses on providing relief from the symptoms and stress of a serious illness. The goal is to improve quality of life for both the patient and the family.  We discussed a brief life review of the patient.  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  Her spouse died approximately 25 years ago.  She has no children.  She has a cousin who is much younger who assist her at times, but she is fiercely independent.  As far as functional and nutritional status, prior to this admission [name] had been independent with IADLs/ADLs.  We discussed her current illness and what it means in the larger  context of her on-going co-morbidities.  Natural disease trajectory and expectations at EOL were discussed.  [name] is willing to follow-up with oncology for repeat scans mid February, but tells me that she would not take any chemotherapy or radiation.  I attempted to elicit values and goals of care important to the patient.  [name] goal is to live in her own home for the rest of her life.  Hospice and Palliative Care services outpatient were explained and offered.  [name] would [name] in-home services.  Questions and concerns were addressed.  Encouraged to call with questions or concerns.   Conference with attending, bedside nursing, RN CM related to patient condition, needs, discharge plan.  HCPOA    HCPOA -friend, [name], who is both durable and healthcare power of attorney.    SUMMARY OF RECOMMENDATIONS   Home with the benefits of hospice of [place] Planning:  DNR  Symptom Management:   Per hospitalist, no additional needs at this time.  Palliative Prophylaxis:   No special needs at this time  Additional Recommendations (Limitations, Scope, Preferences):  Treat the treatable, but no CPR, no intubation.  States she would not take chemo or radiation  Psycho-social/Spiritual:   Desire for further Chaplaincy  support:no  Additional Recommendations: Caregiving  Support/Resources and Education on Hospice  Prognosis:   Unable to determine, based on outcomes.  6 months or less would not be surprising based on cancer burden, 4 hospitalizations in 6 months, declining functional status.  Discharge Planning: Home with hospice of [facility]      Primary Diagnoses: Present on Admission: . Guaiac positive stools . Carotid artery disease ([place]) . Hyperlipidemia . Prediabetes . COPD (chronic obstructive pulmonary disease) ([place]) . Cancer of cecum s/p proximal right colectomy  12/08/2018 . CKD (chronic kidney disease), stage IV ([place]) . Iron deficiency anemia due to chronic blood loss . Urothelial carcinoma of left distal ureter ([place]) . Lower GI bleed from cecal cancer . DNR (do not resuscitate)   I have reviewed the medical record, interviewed the patient and family, and examined the patient. The following aspects are pertinent.  Past Medical History:  Diagnosis Date  . Breast cancer ([place])   . Breast cancer, right breast ([place]) 05/01/2015  . Cancer (HCC)    BREAST CANCER   . Colon polyps   . COPD (chronic obstructive pulmonary disease) ([place])    DR. [place]  . Diabetes mellitus without complication ([place])    PREDIABETIC  CONTROL W/ DIET  . Hypercholesteremia   . Hypertension   . Lung nodules   . Osteoarthritis   . Osteopenia 09/18/2013  . Osteoporosis   . Personal history of radiation therapy   . PONV (postoperative nausea and vomiting)    ' i get so sick with anesthesia"   . Shortness of breath dyspnea    W/ EXERTION  . Tick bite    3 years ago    Social History   Socioeconomic History  . Marital status: Widowed    Spouse name: Not on file  . Number of children: Not on file  . Years of education: Not on file  . Highest education level: Not on file  Occupational History  . Not on file  Tobacco Use  . Smoking status: Former Smoker    Packs/day: 2.00    Years: 50.00    Pack years: 100.00    Types: Cigarettes    Quit date: 03/15/1988    Years since quitting: 31.0  . Smokeless tobacco: Never Used  Substance and Sexual Activity  . Alcohol use: Yes    Alcohol/week: 0.0 standard drinks    Comment: seldom   . Drug use: No  . Sexual activity: Not on file  Other Topics Concern  . Not on file  Social History Narrative  . Not on file   Social Determinants of Health   Financial Resource Strain:   . Difficulty of Paying Living Expenses: Not on file  Food Insecurity: No Food Insecurity  . Worried About Charity fundraiser in the Last Year:  Never true  . Ran Out of Food in the Last Year: Never true  Transportation Needs: No Transportation Needs  . Lack of Transportation (Medical): No  . Lack of Transportation (Non-Medical): No  Physical Activity:   . Days of Exercise per Week: Not on file  . Minutes of Exercise per Session: Not on file  Stress:   . Feeling of Stress : Not on file  Social Connections:   . Frequency of Communication with Friends and Family: Not on file  . Frequency of Social Gatherings with Friends and Family: Not on file  . Attends Religious Services: Not on file  . Active Member of Clubs or Organizations: Not on  file  . Attends Archivist Meetings: Not on file  . Marital Status: Not on file   Family History  Problem Relation Age of Onset  . Colon cancer Father    Scheduled Meds: . amLODipine  5 mg Oral Daily  . anastrozole  1 mg Oral QPM  . cyanocobalamin  1,000 mcg Intramuscular Once  . multivitamin with minerals  1 tablet Oral Daily  . pantoprazole (PROTONIX) IV  40 mg Intravenous Q12H  . QUEtiapine  25 mg Oral QHS   Continuous Infusions: . sodium chloride 35 mL/hr at 03/22/19 0943   PRN Meds:.acetaminophen **OR** acetaminophen, albuterol, hydrocortisone, ondansetron **OR** ondansetron (ZOFRAN) IV Medications Prior to Admission:  Prior to Admission medications   Medication Sig Start Date End Date Taking? Authorizing Provider  amLODipine (NORVASC) 5 MG tablet TAKE ONE TABLET BY MOUTH ONCE DAILY. Patient taking differently: Take 5 mg by mouth daily.  11/14/18  Yes Lauren Drown, MD  anastrozole (ARIMIDEX) 1 MG tablet Take 1 tablet (1 mg total) by mouth daily. Patient taking differently: Take 1 mg by mouth every evening.  11/04/17  Yes Derek Jack, MD  fluticasone [facility]) 50 MCG/ACT nasal spray Place 2 sprays into both nostrils daily as needed for allergies.    Yes [provider]  furosemide (LASIX) 20 MG tablet Take 1 tablet (20 mg total) by mouth daily as needed  for fluid. 03/12/19  Yes Johnson, Clanford L, MD  hydrocortisone (ANUSOL-HC) 2.5 % rectal cream Place 1 application rectally 2 (two) times daily as needed for hemorrhoids or anal itching.  12/27/18  Yes [provider]  levocetirizine (XYZAL) 5 MG tablet Take 5 mg by mouth daily as needed for allergies.  10/27/18  Yes [provider]  Multiple Vitamins-Minerals (MULTIVITAMIN WITH MINERALS) tablet Take 1 tablet by mouth daily. 12/15/18 12/15/19 Yes Mercy Riding, MD  nortriptyline (PAMELOR) 10 MG capsule TAKE 2 TO 3 CAPSULES BY MOUTH NIGHTLY. Patient taking differently: Take 10-20 mg by mouth at bedtime.  11/14/18  Yes Luking, Elayne Snare, MD  ondansetron (ZOFRAN) 4 MG tablet Take 1 tablet (4 mg total) by mouth every 6 (six) hours as needed for nausea. 03/19/19  Yes Shah, Pratik D, DO  OXYGEN Inhale 2 L into the lungs. As needed for activities requiring exertion, per patient uses very sparingly   Yes [provider]  potassium chloride (KLOR-CON) 10 MEQ tablet Take one tablet bid on the days you take lasix ( furosemide) Patient taking differently: Take 10 mEq by mouth See admin instructions. Take one tablet bid on the days you take lasix ( furosemide) 12/28/18  Yes Luking, Elayne Snare, MD  PROAIR RESPICLICK 562 (90 Base) MCG/ACT AEPB INHALE 1 PUFF INTO THE LUNGS EVERY 4 HOURS AS NEEDED. Patient taking differently: Inhale 1 puff into the lungs every 4 (four) hours as needed (for shortness of breath/wheezing).  03/24/18  Yes Juanito Doom, MD   Allergies  Allergen Reactions  . Augmentin [Amoxicillin-Pot Clavulanate] Nausea And Vomiting    Has patient had a PCN reaction causing immediate rash, facial/tongue/throat swelling, SOB or lightheadedness with hypotension: No Has patient had a PCN reaction causing severe rash involving mucus membranes or skin necrosis: No Has patient had a PCN reaction that required hospitalization: No Has patient had a PCN reaction occurring within the last 10  years: Unknown If all of the above answers are "NO", then may proceed with Cephalosporin use.    Marland Kitchen Amoxicillin Diarrhea    Has patient  had a PCN reaction causing immediate rash, facial/tongue/throat swelling, SOB or lightheadedness with hypotension: No Has patient had a PCN reaction causing severe rash involving mucus membranes or skin necrosis: No Has patient had a PCN reaction that required hospitalization: No Has patient had a PCN reaction occurring within the last 10 years: Unknown If all of the above answers are "NO", then may proceed with Cephalosporin use.   . Codeine Nausea And Vomiting  . Morphine Nausea And Vomiting  . Neomycin-Bacitracin Zn-Polymyx Itching and Rash    Unable to use Neosporin   Review of Systems  Unable to perform ROS: Age    Physical Exam Vitals and nursing note reviewed.  Constitutional:      General: She is not in acute distress.    Appearance: She is normal weight. She is not ill-appearing.  Cardiovascular:     Rate and Rhythm: Normal rate.  Abdominal:     General: Abdomen is flat.  Skin:    General: Skin is warm and dry.  Neurological:     Mental Status: She is alert and oriented to person, place, and time.  Psychiatric:        Mood and Affect: Mood normal.        Behavior: Behavior normal.     Vital Signs: BP (!) 129/52 (BP Location: Right Arm)   Pulse 66   Temp 98.2 F (36.8 C) (Oral)   Resp 18   Ht '5\' 4"'$  (1.626 m)   Wt 53.1 kg   SpO2 97%   BMI 20.09 kg/m  Pain Scale: 0-10   Pain Score: 0-No pain   SpO2: SpO2: 97 % O2 Device:SpO2: 97 % O2 Flow Rate: .   IO: Intake/output summary:   Intake/Output Summary (Last 24 hours) at 03/22/2019 1321 Last data filed at 03/22/2019 1696 Gross per 24 hour  Intake 1069.4 ml  Output 1250 ml  Net -180.6 ml    LBM: Last BM Date: 03/21/19 Baseline Weight: Weight: 55.2 kg Most recent weight: Weight: 53.1 kg     Palliative Assessment/Data:   Flowsheet Rows     Most Recent Value    Intake Tab  Referral Department  Hospitalist  Unit at Time of Referral  Med/Surg Unit  Palliative Care Primary Diagnosis  Cardiac  Date Notified  03/21/19  Palliative Care Type  New Palliative care  Reason for referral  Clarify Goals of Care  Date of Admission  03/21/19  Date first seen by Palliative Care  03/22/19  # of days Palliative referral response time  1 Day(s)  # of days IP prior to Palliative referral  0  Clinical Assessment  Palliative Performance Scale Score  40%  Pain Max last 24 hours  Not able to report  Pain Min Last 24 hours  Not able to report  Dyspnea Max Last 24 Hours  Not able to report  Dyspnea Min Last 24 hours  Not able to report  Psychosocial & Spiritual Assessment  Palliative Care Outcomes      Time In: 0920 Time Out: 1010 Time Total: 70 minutes  Greater than 50%  of this time was spent counseling and coordinating care related to the above assessment and plan.  Signed by: Drue Novel, NP   Please contact Palliative Medicine Team phone at [phone] for questions and concerns.  For individual provider: See Shea Evans

## 2019-03-22 NOTE — Progress Notes (Signed)
Subjective:  Patient denies nausea vomiting or abdominal pain.  She did pass liquid stool.  According to nursing staff she was incontinent.  No melena or rectal bleeding reported.  Patient states she is hungry.  She is also worried about her bills if she has to go to a nursing home for rehab.  She says she paid a premium for many years and missed 1 and her insurance was canceled.  Current Facility-Administered Medications  Medication Dose Route Frequency Provider Last Rate Last Admin  . 0.9 %  sodium chloride infusion   Intravenous Continuous Wynetta Emery, Clanford L, MD 35 mL/hr at 03/22/19 0943 Rate Change at 03/22/19 0943  . acetaminophen (TYLENOL) tablet 650 mg  650 mg Oral Q6H PRN Johnson, Clanford L, MD       Or  . acetaminophen (TYLENOL) suppository 650 mg  650 mg Rectal Q6H PRN Johnson, Clanford L, MD      . albuterol (PROVENTIL) (2.5 MG/3ML) 0.083% nebulizer solution 2.5 mg  2.5 mg Nebulization Q4H PRN Johnson, Clanford L, MD      . amLODipine (NORVASC) tablet 5 mg  5 mg Oral Daily Johnson, Clanford L, MD   5 mg at 03/22/19 0944  . anastrozole (ARIMIDEX) tablet 1 mg  1 mg Oral QPM Johnson, Clanford L, MD   1 mg at 03/21/19 2144  . cyanocobalamin ((VITAMIN B-12)) injection 1,000 mcg  1,000 mcg Intramuscular Once Tamana Hatfield U, MD      . hydrocortisone (ANUSOL-HC) 2.5 % rectal cream 1 application  1 application Rectal BID PRN Johnson, Clanford L, MD      . multivitamin with minerals tablet 1 tablet  1 tablet Oral Daily Wynetta Emery, Clanford L, MD   1 tablet at 03/22/19 0944  . ondansetron (ZOFRAN) tablet 4 mg  4 mg Oral Q6H PRN Johnson, Clanford L, MD       Or  . ondansetron (ZOFRAN) injection 4 mg  4 mg Intravenous Q6H PRN Johnson, Clanford L, MD      . pantoprazole (PROTONIX) injection 40 mg  40 mg Intravenous Q12H Johnson, Clanford L, MD   40 mg at 03/22/19 0944  . QUEtiapine (SEROQUEL) tablet 25 mg  25 mg Oral QHS Johnson, Clanford L, MD   25 mg at 03/21/19 2143     Objective: Blood  pressure (!) 129/52, pulse 66, temperature 98.2 F (36.8 C), temperature source Oral, resp. rate 18, height '5\' 4"'$  (1.626 m), weight 53.1 kg, SpO2 97 %. Patient is alert and in no acute distress. Conjunctiva is pink. Sclera is nonicteric Oropharyngeal mucosa is normal. No neck masses or thyromegaly noted. Cardiac exam with regular rhythm normal S1 and S2.  Grade 2/6 systolic murmur at aortic area.  It is unchanged since yesterday Lungs are clear to auscultation. Abdomen is symmetrical with lower midline scar extending to above the level of umbilicus.  Bowel sounds are hypoactive.  On palpation abdomen is soft and nontender somewhat tympanic on percussion.  No organomegaly or masses. No LE edema or clubbing noted.  Labs/studies Results:  CBC Latest Ref Rng & Units 03/22/2019 03/21/2019 03/19/2019  WBC 4.0 - 10.5 K/uL 7.1 7.8 8.9  Hemoglobin 12.0 - 15.0 g/dL 8.2(L) 7.7(L) 7.8(L)  Hematocrit 36.0 - 46.0 % 28.7(L) 25.1(L) 26.4(L)  Platelets 150 - 400 K/uL 250 268 283    CMP Latest Ref Rng & Units 03/22/2019 03/21/2019 03/19/2019  Glucose 70 - 99 mg/dL 71 93 122(H)  BUN 8 - 23 mg/dL 35(H) 41(H) 25(H)  Creatinine 0.44 -  1.00 mg/dL 1.81(H) 2.01(H) 1.66(H)  Sodium 135 - 145 mmol/L 142 142 144  Potassium 3.5 - 5.1 mmol/L 3.6 3.8 3.8  Chloride 98 - 111 mmol/L 108 104 102  CO2 22 - 32 mmol/L 25 26 32  Calcium 8.9 - 10.3 mg/dL 7.4(L) 7.5(L) 7.1(L)  Total Protein 6.5 - 8.1 g/dL 4.5(L) 4.5(L) -  Total Bilirubin 0.3 - 1.2 mg/dL 0.6 0.7 -  Alkaline Phos 38 - 126 U/L 60 58 -  AST 15 - 41 U/L 26 25 -  ALT 0 - 44 U/L 23 23 -    Hepatic Function Latest Ref Rng & Units 03/22/2019 03/21/2019 03/18/2019  Total Protein 6.5 - 8.1 g/dL 4.5(L) 4.5(L) 5.3(L)  Albumin 3.5 - 5.0 g/dL 2.3(L) 2.3(L) 2.7(L)  AST 15 - 41 U/L '26 25 18  '$ ALT 0 - 44 U/L '23 23 21  '$ Alk Phosphatase 38 - 126 U/L 60 58 64  Total Bilirubin 0.3 - 1.2 mg/dL 0.6 0.7 1.1  Bilirubin, Direct 0.0 - 0.2 mg/dL - - -    Magnesium 1.93 INR 0.9 CEA  pending  Stool studies are pending.   Assessment:  #1.  Nausea vomiting and diarrhea.  She was hospitalized twice for small bowel obstruction within the last 2 weeks.  Small bowel obstruction felt to be due to adhesions related to right hemicolectomy that she had in September 2020 for cecal carcinoma.  Both occasions she responded to medical therapy.  Her symptoms and exam do not suggest recurrent bowel obstruction.  She could have gastroenteritis.  Stool studies are pending. I wonder if she has developed small intestinal bacterial overgrowth secondary to loss of ileocecal valve.  If stool studies are negative may consider empiric trial with low-dose metronidazole.  #2.  Anemia.  Hemoglobin has not dropped with hydration.  No significant change in hemoglobin over the last 3 days.  Her hemoglobin was 7.8 g when she was discharged 3 days ago.  Stool is guaiac positive.  No evidence of overt GI bleed.  MCV has has increased.  She states she has not had a B12 shot in few months.  #3.  Acute on chronic kidney injury secondary to dehydration.  Renal function is improving with hydration.  #4.  History of cecal carcinoma.  Status post right hemicolectomy in September 2020.  No evidence of residual disease.  She has mildly elevated CEA which dropped since surgery but has not normalized.  Repeat is pending.   Recommendations  B12 1 mg IM today. CBC and chemistry panel in a.m. Advance diet to full liquids. Requested the nursing staff try to obtain stool specimen as soon as possible. Simethicone 160 mg x 4 doses.

## 2019-03-22 NOTE — Plan of Care (Signed)

## 2019-03-22 NOTE — TOC Initial Note (Signed)
Transition of Care [facility]) - Initial/Assessment Note    Patient Details  Name: [name] MRN: [id number] Date of Birth: 10-Aug-1928  Transition of Care [facility]) CM/SW Contact:    Boneta Lucks, RN Phone Number: 03/22/2019, 2:33 PM  Clinical Narrative:         Patient readmitted after 2 days home.  Patient lives alone. PT is recommending SNF, Patient refusing, states she was so sick her insurance premium was late and insurance cancelled.  CM called [facility] her insurance is active.  Advised patient, she was tearful and thankful, she had been very worried.  Patient still wants to go home, very independent. Palliative is recommending home with hospice, patient is agreeable with this plan, due to refusing treatments.  Sent a referral to  [place] at [facility] as requested by patient.           Expected Discharge Plan: Home w Hospice Care Barriers to Discharge: Continued Medical Work up   Patient Goals and CMS Choice Patient states their goals for this hospitalization and ongoing recovery are:: to go home.      Expected Discharge Plan and Services Expected Discharge Plan: [place] Arrangements/Services   Lives with:: Self Patient language and need for interpreter reviewed:: Yes        Need for Family Participation in Patient Care: Yes (Comment) Care giver support system in place?: Yes (comment) Current home services: DME Criminal Activity/Legal Involvement Pertinent to Current Situation/Hospitalization: No - Comment as needed  Activities of Daily Living Home Assistive Devices/Equipment: Cane (specify quad or straight), Walker (specify type), Oxygen ADL Screening (condition at time of admission) Patient's cognitive ability adequate to safely complete daily activities?: Yes Is the patient deaf or have difficulty hearing?: No Does the patient have difficulty seeing, even when wearing glasses/contacts?: No Does the patient have difficulty  concentrating, remembering, or making decisions?: No Patient able to express need for assistance with ADLs?: Yes Does the patient have difficulty dressing or bathing?: No Independently performs ADLs?: Yes (appropriate for developmental age) Does the patient have difficulty walking or climbing stairs?: Yes Weakness of Legs: Both Weakness of Arms/Hands: None  Permission Sought/Granted    Emotional Assessment     Affect (typically observed): Accepting Orientation: : Oriented to Self, Oriented to Place, Oriented to Situation, Oriented to  Time Alcohol / Substance Use: Not Applicable Psych Involvement: No (comment)  Admission diagnosis:  Guaiac positive stools [R19.5] Nausea vomiting and diarrhea [R11.2, R19.7] Gastrointestinal hemorrhage, unspecified gastrointestinal hemorrhage type [K92.2] Anemia, unspecified type [D64.9] Patient Active Problem List   Diagnosis Date Noted  . Goals of care, counseling/discussion   . Palliative care by specialist   . Encounter for hospice care discussion   . Anemia   . Gastrointestinal hemorrhage   . Nausea vomiting and diarrhea   . Guaiac positive stools 03/21/2019  . DNR (do not resuscitate) 03/21/2019  . SBO (small bowel obstruction) ([place]) 03/08/2019  . Cecal cancer ([place]) 12/26/2018  . Lower GI bleed from cecal cancer 12/10/2018  . S/P colon resection 12/08/2018  . Iron deficiency anemia due to chronic blood loss   . Urothelial carcinoma of left distal ureter ([place])   . 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 CKD (chronic kidney disease), stage IV ([place]) 12/05/2018  . Acute anemia 12/04/2018  . Ureteral mass 05/11/2018  . Bladder cancer ([place]) 03/20/2018  . 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 Chronic respiratory failure with hypoxia ([place]) 08/13/2016  . Pulmonary nodule 07/19/2016  . Renal bruit 06/29/2016  . Dyspnea 05/04/2016  . COPD  (chronic obstructive pulmonary disease) ([place]) 04/26/2016  . Breast cancer, right breast ([place]) 05/01/2015  . Osteoarthritis of both knees 04/21/2015  . Acute bronchitis with COPD ([place]) 03/21/2015  . Abnormal CXR 01/23/2015  . Prediabetes 10/15/2014  . Osteopenia 09/18/2013  . Peripheral neuropathy 08/13/2013  . Hyperglycemia 08/13/2013  . Essential hypertension, benign 09/20/2012  . Carotid artery disease ([place]) 09/20/2012  . Hyperlipidemia 09/20/2012  . Arthritis of right knee 06/08/2007  . KNEE PAIN 06/08/2007   PCP:  Kathyrn Drown, MD Pharmacy:   [place], [place] [address] Phone: [phone] Fax: [phone]     Social Determinants of Health (SDOH) Interventions    Readmission Risk Interventions Readmission Risk Prevention Plan 03/12/2019  Transportation Screening Complete  Medication Review ([place]) Complete  PCP or Specialist appointment within 3-5 days of discharge Complete  HRI or Home Care Consult Patient refused  SW Recovery Care/Counseling Consult Patient refused  Palliative Care Screening Not [place] Not Applicable  Some recent data might be hidden

## 2019-03-22 NOTE — Progress Notes (Signed)
PROGRESS NOTE [facility]   [name]  EPP:[id number]  DOB: Nov 01, 1928  DOA: 03/21/2019 PCP: Kathyrn Drown, MD   Brief Admission Hx: 86 y.o. 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  She reports that she has been having difficulty eating and drinking and having nausea vomiting and diarrhea.   MDM/Assessment & Plan:    1. Mild dehydration-Improving.  Patient has had poor oral intake since being at home.  She presented with diarrhea and nausea and vomiting.  She has a mild AKI.  Treating with IV fluid hydration.  Monitor electrolytes closely. 2. AKI - improving with IV fluids.  3. Nausea vomiting diarrhea-she has been placed on enteric precautions.  No recent antibiotic use.  GI panel pending. 4. Guaiac positive stools-She is heme positive.  GI is following. Hg stable.  With her history of colon cancer and it appears that the cancer was removed with clear margins in September 2020.  Type and screen and follow. 5. Iron deficiency anemia due to chronic blood loss-type and screen and transfuse if needed. 6. COPD-stable resume home bronchodilators. 7. Stage IV CKD-monitor creatinine daily with hydration. 8. DNR on admission-palliative medicine consult as patient needs to be at the end of life given multiple cancers and progressive debility. 9. Physical debility-PT recommending SNF but patient declining to go.    DVT  prophylaxis: SCDs Code Status: Full Family Communication:   Disposition Plan: admit for IV fluids and GI consult Consults called:   Admission status:  INP  Consultants:  GI  palliative  Procedures:    Antimicrobials:     Subjective: Pt reports that she feels much better today.   Objective: Vitals:   03/21/19 1936 03/21/19 1957 03/21/19 2249 03/22/19 0556  BP: (!) 109/51  (!) 114/59 (!) 129/52  Pulse: 73  75 66  Resp: 20  20 18   Temp: 98.1 F (36.7 C)  98.9 F (37.2 C) 98.2 F (36.8 C)  TempSrc: Oral  Oral Oral  SpO2: 100% 98% 97% 97%  Weight: 53.1 kg     Height: 5\' 4"  (1.626 m)       Intake/Output Summary (Last 24 hours) at 03/22/2019 1359 Last data filed at 03/22/2019 0943 Gross per 24 hour  Intake 1069.4 ml  Output 1250 ml  Net -180.6 ml   Filed Weights   03/21/19 0747 03/21/19 1936  Weight: 55.2 kg 53.1 kg   REVIEW OF SYSTEMS  As per history otherwise all reviewed and reported negative  Exam:  General exam: frail elderly female, awake, alert, NAD.  Respiratory system:  No increased work of breathing. Cardiovascular system: normal S1 & S2 heard. No JVD, murmurs, gallops, clicks or pedal edema. Gastrointestinal system: Abdomen is nondistended, soft and nontender. Normal bowel sounds heard. Central nervous system: Alert and oriented. No focal neurological deficits. Extremities: no CCE.  Data Reviewed: Basic Metabolic Panel: Recent Labs  Lab 03/17/19 0559 03/18/19 0617 03/19/19 0555 03/21/19 1050 03/22/19 0544  NA 148* 152* 144 142 142  K 3.0* 3.0* 3.8 3.8 3.6  CL 105 100  102 104 108  CO2 24 28 32 26 25  GLUCOSE 79 83 122* 93 71  BUN 38* 33* 25* 41* 35*  CREATININE 1.63* 1.76* 1.66* 2.01* 1.81*  CALCIUM 7.5* 7.5* 7.1* 7.5* 7.4*  MG 1.6* 2.5* 2.0  --  1.9   Liver Function Tests: Recent Labs  Lab 03/16/19 0420 03/17/19 0559 03/18/19 0617 03/21/19 1050 03/22/19 0544  AST 16 18 18 25 26   ALT 20 20 21 23 23   ALKPHOS 67 62 64 58 60    BILITOT 0.8 1.0 1.1 0.7 0.6  PROT 5.3* 4.9* 5.3* 4.5* 4.5*  ALBUMIN 2.7* 2.5* 2.7* 2.3* 2.3*   Recent Labs  Lab 03/15/19 1551  LIPASE 52*   No results for input(s): AMMONIA in the last 168 hours. CBC: Recent Labs  Lab 03/17/19 0559 03/18/19 0617 03/19/19 0555 03/21/19 1050 03/22/19 0544  WBC 8.9 11.1* 8.9 7.8 7.1  NEUTROABS  --   --   --  6.3 5.6  HGB 9.0* 9.5* 7.8* 7.7* 8.2*  HCT 29.0* 31.5* 26.4* 25.1* 28.7*  MCV 94.8 97.2 99.6 98.4 103.2*  PLT 278 348 283 268 250   Cardiac Enzymes: No results for input(s): CKTOTAL, CKMB, CKMBINDEX, TROPONINI in the last 168 hours. CBG (last 3)  No results for input(s): GLUCAP in the last 72 hours. Recent Results (from the past 240 hour(s))  SARS CORONAVIRUS 2 (TAT 6-24 HRS) Nasopharyngeal Nasopharyngeal Swab     Status: None   Collection Time: 03/15/19  8:57 PM   Specimen: Nasopharyngeal Swab  Result Value Ref Range Status   SARS Coronavirus 2 NEGATIVE NEGATIVE Final    Comment: (NOTE) SARS-CoV-2 target nucleic acids are NOT DETECTED. The SARS-CoV-2 RNA is generally detectable in upper and lower respiratory specimens during the acute phase of infection. Negative results do not preclude SARS-CoV-2 infection, do not rule out co-infections with other pathogens, and should not be used as the sole basis for treatment or other patient management decisions. Negative results must be combined with clinical observations, patient history, and epidemiological information. The expected result is Negative. Fact Sheet for Patients: SugarRoll.be Fact Sheet for Healthcare Providers: https://www.woods-mathews.com/ This test is not yet approved or cleared by the [place] FDA and  has been authorized for detection and/or diagnosis of SARS-CoV-2 by FDA under an Emergency Use Authorization (EUA). This EUA will remain  in effect (meaning this test can be used) for the duration of the COVID-19 declaration  under Section 56 4(b)(1) of the Act, 21 U.S.C. section 360bbb-3(b)(1), unless the authorization is terminated or revoked sooner. Performed at [facility], [place] [address], [place], [place] [address]   SARS CORONAVIRUS 2 (TAT 6-24 HRS) Nasopharyngeal Nasopharyngeal Swab     Status: None   Collection Time: 03/21/19  1:17 PM   Specimen: Nasopharyngeal Swab  Result Value Ref Range Status   SARS Coronavirus 2 NEGATIVE NEGATIVE Final    Comment: (NOTE) SARS-CoV-2 target nucleic acids are NOT DETECTED. The SARS-CoV-2 RNA is generally detectable in upper and lower respiratory specimens during the acute phase of infection. Negative results do not preclude SARS-CoV-2 infection, do not rule out co-infections with other pathogens, and should not be used as the sole basis for treatment or other patient management decisions. Negative results must be combined with clinical observations, patient history, and epidemiological information. The expected result is Negative. Fact Sheet for Patients: SugarRoll.be Fact Sheet for Healthcare Providers: https://www.woods-mathews.com/ This test is not yet approved or cleared by the [place] FDA and  has been authorized for detection and/or diagnosis of SARS-CoV-2 by FDA under an Emergency Use Authorization (EUA). This EUA will remain  in effect (meaning this test can be used) for the duration of the COVID-19 declaration under Section 56 4(b)(1) of the Act, 21 U.S.C. section 360bbb-3(b)(1), unless the authorization is terminated or revoked sooner. Performed at [facility], [place] [address] [place], [place] [address]      Studies: Acute Abd Series  Result Date: 03/21/2019 CLINICAL DATA:  Nausea, vomiting, diarrhea, abdominal pain EXAM: DG ABDOMEN ACUTE W/ 1V CHEST COMPARISON:  03/09/2019 FINDINGS: There is no evidence of dilated bowel loops or free intraperitoneal air. Heart size and mediastinal  contours are within normal limits. Both lungs are clear. IMPRESSION: Negative abdominal radiographs.  No acute cardiopulmonary disease. Electronically Signed   By: Macy Mis M.D.   On: 03/21/2019 08:52     Scheduled Meds: . amLODipine  5 mg Oral Daily  . anastrozole  1 mg Oral QPM  . cyanocobalamin  1,000 mcg Intramuscular Once  . multivitamin with minerals  1 tablet Oral Daily  . pantoprazole (PROTONIX) IV  40 mg Intravenous Q12H  . QUEtiapine  25 mg Oral QHS   Continuous Infusions: . sodium chloride 35 mL/hr at 03/22/19 2841    Principal Problem:   Guaiac positive stools Active Problems:   Carotid artery disease (HCC)   Hyperlipidemia   Peripheral neuropathy   Prediabetes   COPD (chronic obstructive pulmonary disease) (HCC)   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   CKD (chronic kidney disease), stage IV (HCC)   Iron deficiency anemia due to chronic blood loss   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)   S/P colon resection   Lower GI bleed from cecal cancer   DNR (do not resuscitate)   Goals of care, counseling/discussion   Palliative care by specialist   Encounter for hospice care discussion   Time spent:   Irwin Brakeman, MD [facility] 03/22/2019, 1:59 PM    LOS: 0 days  How to contact the [facility] Attending or Consulting provider [place] or covering provider during after hours [place], for this patient?  1. Check the care team in [facility] and look for a) attending/consulting TRH provider listed and b) the [facility] team listed 2. Log into [email or web address] and use [place]'s universal password to access. If you do not have the password, please contact the hospital operator. 3. Locate the [facility] provider you are looking for under [facility] and page to a number that you can be directly reached. 4. If you still have difficulty reaching the provider, please page the [facility] (Director on Call) for the Hospitalists listed on amion for  assistance.

## 2019-03-22 NOTE — Evaluation (Signed)
Physical Therapy Evaluation Patient Details Name: [name] MRN: [id number] DOB: 09-Jan-1929 Today's Date: 03/22/2019   History of Present Illness  [name] is a 86 y.o. female with medical history significant of cecal adenocarcinoma, hypertension, urothelial carcinoma, breast cancer, COPD, prediabetes who presented to the ER with abdominal pain.  Patient was recently admitted here with a partial small bowel obstruction from 03/08/2019-03/12/2019.  She was discharged after tolerating oral feeds and having multiple bowel movements.  She states she has had recurrent symptoms in the last 24 hours with increased abdominal pain, distention, nausea, reduced appetite.  Unable to tolerate anything p.o. in the last 24 hours.  Feels really weak and dehydrated.  Contacted her PCP who recommended coming back to the ER.  Lives alone and is usually independent at baseline.  Currently having difficulty caring for herself because of weakness.    Clinical Impression  Patient demonstrates slow labored movement for sitting up at bedside, very unsteady on feet and unable to maintain standing balance using SPC only, required hand held assist to ambulate in room and hallway due to fall risk.  Patient incontinent of stool and bowel, limited for ambulation due to fatigue and tolerated sitting up in chair after therapy.  Patient will benefit from continued physical therapy in hospital and recommended venue below to increase strength, balance, endurance for safe ADLs and gait.    Follow Up Recommendations SNF;Supervision for mobility/OOB;Supervision/Assistance - 24 hour    Equipment Recommendations  None recommended by PT    Recommendations for Other Services       Precautions / Restrictions Precautions Precautions: Fall Restrictions Weight Bearing Restrictions: No      Mobility  Bed Mobility Overal bed mobility: Needs Assistance Bed Mobility: Supine to Sit     Supine to sit: Min assist      General bed mobility comments: increased time, labored movement  Transfers Overall transfer level: Needs assistance Equipment used: 1 person hand held assist;Straight cane Transfers: Sit to/from Stand;Stand Pivot Transfers Sit to Stand: Min assist Stand pivot transfers: Min assist       General transfer comment: slow labored unsteady movement  Ambulation/Gait Ambulation/Gait assistance: Min guard Gait Distance (Feet): 40 Feet Assistive device: Straight cane;1 person hand held assist Gait Pattern/deviations: Decreased step length - right;Decreased step length - left;Decreased stride length Gait velocity: decreased   General Gait Details: unsteady slow labored cadence, has to lean on nearby objects for support while using SPC, required hand held assistance for safety and limited secondary to fatigue  Stairs            Wheelchair Mobility    Modified Rankin (Stroke Patients Only)       Balance Overall balance assessment: Needs assistance Sitting-balance support: Feet supported;No upper extremity supported Sitting balance-Leahy Scale: Fair Sitting balance - Comments: fair/good seated at EOB   Standing balance support: During functional activity;Single extremity supported Standing balance-Leahy Scale: Poor Standing balance comment: fair using SPC and hand held assist                             Pertinent Vitals/Pain Pain Assessment: No/denies pain    Home Living Family/patient expects to be discharged to:: Private residence Living Arrangements: Alone Available Help at Discharge: Friend(s);Family;Available PRN/intermittently Type of Home: House Home Access: Stairs to enter Entrance Stairs-Rails: Right;Left;Can reach both Entrance Stairs-Number of Steps: 5 Home Layout: One level Home Equipment: Cane - single point;Walker - 2 wheels  Prior Function Level of Independence: Independent with assistive device(s)         Comments: Household and  short distanced community ambulator with RW     Hand Dominance   Dominant Hand: Right    Extremity/Trunk Assessment   Upper Extremity Assessment Upper Extremity Assessment: Generalized weakness    Lower Extremity Assessment Lower Extremity Assessment: Generalized weakness    Cervical / Trunk Assessment Cervical / Trunk Assessment: Kyphotic  Communication   Communication: No difficulties  Cognition Arousal/Alertness: Awake/alert Behavior During Therapy: WFL for tasks assessed/performed Overall Cognitive Status: Within Functional Limits for tasks assessed                                        General Comments      Exercises     Assessment/Plan    PT Assessment Patient needs continued PT services  PT Problem List Decreased strength;Decreased activity tolerance;Decreased balance;Decreased mobility       PT Treatment Interventions Gait training;Balance training;Stair training;Functional mobility training;Therapeutic activities;Patient/family education    PT Goals (Current goals can be found in the Care Plan section)  Acute Rehab PT Goals Patient Stated Goal: return home with family/friends to assist PT Goal Formulation: With patient Time For Goal Achievement: 04/05/19 Potential to Achieve Goals: Good    Frequency Min 3X/week   Barriers to discharge        Co-evaluation               AM-PAC PT "6 Clicks" Mobility  Outcome Measure Help needed turning from your back to your side while in a flat bed without using bedrails?: A Little Help needed moving from lying on your back to sitting on the side of a flat bed without using bedrails?: A Little Help needed moving to and from a bed to a chair (including a wheelchair)?: A Lot Help needed standing up from a chair using your arms (e.g., wheelchair or bedside chair)?: A Little Help needed to walk in hospital room?: A Lot Help needed climbing 3-5 steps with a railing? : A Lot 6 Click Score:  15    End of Session   Activity Tolerance: Patient tolerated treatment well;Patient limited by fatigue Patient left: in chair;with call bell/phone within reach;with chair alarm set Nurse Communication: Mobility status PT Visit Diagnosis: Other abnormalities of gait and mobility (R26.89);Muscle weakness (generalized) (M62.81);Unsteadiness on feet (R26.81)    Time: 2831-5176 PT Time Calculation (min) (ACUTE ONLY): 32 min   Charges:   PT Evaluation $PT Eval Moderate Complexity: 1 Mod PT Treatments $Therapeutic Activity: 23-37 mins        2:32 PM, 03/22/19 Lonell Grandchild, MPT Physical Therapist with [facility] [phone] office [phone] mobile phone

## 2019-03-23 DIAGNOSIS — G6289 Other specified polyneuropathies: Secondary | ICD-10-CM

## 2019-03-23 DIAGNOSIS — Z515 Encounter for palliative care: Secondary | ICD-10-CM

## 2019-03-23 LAB — CBC WITH DIFFERENTIAL/PLATELET
Abs Immature Granulocytes: 0.03 10*3/uL (ref 0.00–0.07)
Basophils Absolute: 0 10*3/uL (ref 0.0–0.1)
Basophils Relative: 0 %
Eosinophils Absolute: 0.1 10*3/uL (ref 0.0–0.5)
Eosinophils Relative: 1 %
HCT: 28.3 % — ABNORMAL LOW (ref 36.0–46.0)
Hemoglobin: 8.2 g/dL — ABNORMAL LOW (ref 12.0–15.0)
Immature Granulocytes: 1 %
Lymphocytes Relative: 21 %
Lymphs Abs: 1.1 10*3/uL (ref 0.7–4.0)
MCH: 29.4 pg (ref 26.0–34.0)
MCHC: 29 g/dL — ABNORMAL LOW (ref 30.0–36.0)
MCV: 101.4 fL — ABNORMAL HIGH (ref 80.0–100.0)
Monocytes Absolute: 0.4 10*3/uL (ref 0.1–1.0)
Monocytes Relative: 7 %
Neutro Abs: 3.8 10*3/uL (ref 1.7–7.7)
Neutrophils Relative %: 70 %
Platelets: 247 10*3/uL (ref 150–400)
RBC: 2.79 MIL/uL — ABNORMAL LOW (ref 3.87–5.11)
RDW: 15.4 % (ref 11.5–15.5)
WBC: 5.4 10*3/uL (ref 4.0–10.5)
nRBC: 0 % (ref 0.0–0.2)

## 2019-03-23 LAB — BASIC METABOLIC PANEL
Anion gap: 6 (ref 5–15)
BUN: 24 mg/dL — ABNORMAL HIGH (ref 8–23)
CO2: 25 mmol/L (ref 22–32)
Calcium: 7.4 mg/dL — ABNORMAL LOW (ref 8.9–10.3)
Chloride: 108 mmol/L (ref 98–111)
Creatinine, Ser: 1.59 mg/dL — ABNORMAL HIGH (ref 0.44–1.00)
GFR calc Af Amer: 33 mL/min — ABNORMAL LOW (ref >60)
GFR calc non Af Amer: 29 mL/min — ABNORMAL LOW (ref >60)
Glucose, Bld: 82 mg/dL (ref 70–99)
Potassium: 3.5 mmol/L (ref 3.5–5.1)
Sodium: 139 mmol/L (ref 135–145)

## 2019-03-23 LAB — CEA: CEA: 11 ng/mL — ABNORMAL HIGH (ref 0.0–4.7)

## 2019-03-23 LAB — MAGNESIUM: Magnesium: 1.6 mg/dL — ABNORMAL LOW (ref 1.7–2.4)

## 2019-03-23 MED ORDER — METRONIDAZOLE 500 MG PO TABS
250.0000 mg | ORAL_TABLET | Freq: Three times a day (TID) | ORAL | Status: DC
  Administered 2019-03-23 – 2019-03-26 (×11): 250 mg via ORAL
  Filled 2019-03-23 (×12): qty 1

## 2019-03-23 MED ORDER — PANTOPRAZOLE SODIUM 40 MG PO TBEC: 40.0000 mg | DELAYED_RELEASE_TABLET | Freq: Every day | ORAL | 1 refills | Status: AC

## 2019-03-23 MED ORDER — NORTRIPTYLINE HCL 10 MG PO CAPS: 10.0000 mg | ORAL_CAPSULE | Freq: Every day | ORAL | Status: AC

## 2019-03-23 NOTE — Plan of Care (Signed)

## 2019-03-23 NOTE — TOC Transition Note (Addendum)
Transition of Care [facility]) - CM/SW Discharge Note   Patient Details  Name: [name] MRN: [id number] Date of Birth: 1928/11/16  [facility] CM/SW Contact:  Boneta Lucks, RN Phone Number: 03/23/2019, 11:16 AM   Clinical Narrative:   Patient has no equipment needs, medically ready for discharge, hospice has spoke with the patient and ready as well.  Patient discharging home with hospice today.  [facility] will admit her at home.   Addendum: Dr Laural Golden wants to keep patient another day.  Hospice updated, discharge will be tomorrow. Confirmed with patient home with hospice plan she is agreeable for the help they can offer, refusing SNF and Home Health.     Final next level of care: Home w Hospice Care Barriers to Discharge: Barriers Resolved   Patient Goals and CMS Choice Patient states their goals for this hospitalization and ongoing recovery are:: to go home with hospice.   Discharge Placement          Patient and family notified of of transfer: 03/23/19  Discharge Plan and Services    Readmission Risk Interventions Readmission Risk Prevention Plan 03/12/2019  Transportation Screening Complete  Medication Review ([place]) Complete  PCP or Specialist appointment within 3-5 days of discharge Complete  HRI or Home Care Consult Patient refused  SW Recovery Care/Counseling Consult Patient refused  Palliative Care Screening Not [place] Not Applicable  Some recent data might be hidden

## 2019-03-23 NOTE — Progress Notes (Signed)
Physical Therapy Treatment Patient Details Name: [name] MRN: [id number] DOB: 06/26/1928 Today's Date: 03/23/2019    History of Present Illness [name] is a 86 y.o. female with medical history significant of cecal adenocarcinoma, hypertension, urothelial carcinoma, breast cancer, COPD, prediabetes who presented to the ER with abdominal pain.  Patient was recently admitted here with a partial small bowel obstruction from 03/08/2019-03/12/2019.  She was discharged after tolerating oral feeds and having multiple bowel movements.  She states she has had recurrent symptoms in the last 24 hours with increased abdominal pain, distention, nausea, reduced appetite.  Unable to tolerate anything p.o. in the last 24 hours.  Feels really weak and dehydrated.  Contacted her PCP who recommended coming back to the ER.  Lives alone and is usually independent at baseline.  Currently having difficulty caring for herself because of weakness.    PT Comments    Patient able to transition to seated with min guard and use of bed rails. She completes exercises EOB without difficulty and is educated on completing while sitting in chair/bed for the remainder of stay in hospital and upon returning home. She transfers to standing with use of cane and is unsteady upon standing. She is limited to a few small steps with cane secondary to R knee pain. She ambulates with improved gait with use of RW and is not limited by pain but is limited by fatigue. Patient returned to chair at end of session and is educated on using RW for safety until she is ready to sit down in chair. Patient will benefit from continued physical therapy in hospital and recommended venue below to increase strength, balance, endurance for safe ADLs and gait.    Follow Up Recommendations  SNF;Supervision for mobility/OOB;Supervision/Assistance - 24 hour     Equipment Recommendations  None recommended by PT    Recommendations for Other  Services       Precautions / Restrictions Precautions Precautions: Fall Restrictions Weight Bearing Restrictions: No    Mobility  Bed Mobility Overal bed mobility: Needs Assistance Bed Mobility: Supine to Sit     Supine to sit: Min guard     General bed mobility comments: increased time, labored movement, uses bed rail  Transfers Overall transfer level: Needs assistance Equipment used: 1 person hand held assist;Straight cane;Rolling walker (2 wheeled) Transfers: Sit to/from [facility] Sit to Stand: Min assist Stand pivot transfers: Min assist       General transfer comment: slow labored unsteady movement, patient unsteady with use of straight caine, with transition to seated she does not use RW properly and is educated on ambulating to chair with walker, pivoting with walker and reaching back to chair when sitting  Ambulation/Gait Ambulation/Gait assistance: Min guard Gait Distance (Feet): 60 Feet Assistive device: Straight cane;1 person hand held assist;Rolling walker (2 wheeled) Gait Pattern/deviations: Decreased step length - right;Decreased step length - left;Decreased stride length Gait velocity: decreased   General Gait Details: unsteady slow labored cadence, has to lean on nearby objects for support while using SPC, required hand held assistance for safety and limited secondary to fatigue; gait improves with use of RW   Stairs             Wheelchair Mobility    Modified Rankin (Stroke Patients Only)       Balance Overall balance assessment: Needs assistance Sitting-balance support: Feet supported;No upper extremity supported Sitting balance-Leahy Scale: Good Sitting balance - Comments: fair/good seated at EOB   Standing balance  support: During functional activity;Bilateral upper extremity supported Standing balance-Leahy Scale: Fair Standing balance comment: using RW                            Cognition                                               Exercises General Exercises - Lower Extremity Ankle Circles/Pumps: AROM;Both;20 reps;Seated Long Arc Quad: AROM;Both;20 reps;Seated Hip Flexion/Marching: AROM;Both;20 reps;Seated Toe Raises: AROM;Both;20 reps;Seated Heel Raises: AROM;Both;20 reps;Seated    General Comments        Pertinent Vitals/Pain Pain Assessment: No/denies pain    Home Living                      Prior Function            PT Goals (current goals can now be found in the care plan section) Acute Rehab PT Goals Patient Stated Goal: return home with family/friends to assist PT Goal Formulation: With patient Time For Goal Achievement: 04/05/19 Potential to Achieve Goals: Good Progress towards PT goals: Progressing toward goals    Frequency    Min 3X/week      PT Plan Current plan remains appropriate    Co-evaluation              AM-PAC PT "6 Clicks" Mobility   Outcome Measure  Help needed turning from your back to your side while in a flat bed without using bedrails?: A Little Help needed moving from lying on your back to sitting on the side of a flat bed without using bedrails?: A Little Help needed moving to and from a bed to a chair (including a wheelchair)?: A Little Help needed standing up from a chair using your arms (e.g., wheelchair or bedside chair)?: A Little Help needed to walk in hospital room?: A Lot Help needed climbing 3-5 steps with a railing? : A Lot 6 Click Score: 16    End of Session Equipment Utilized During Treatment: Gait belt Activity Tolerance: Patient tolerated treatment well;Patient limited by fatigue Patient left: in chair;with call bell/phone within reach Nurse Communication: Mobility status PT Visit Diagnosis: Other abnormalities of gait and mobility (R26.89);Muscle weakness (generalized) (M62.81);Unsteadiness on feet (R26.81)     Time: 5449-2010 PT Time Calculation (min) (ACUTE ONLY): 20  min  Charges:  $Therapeutic Activity: 8-22 mins                     3:43 PM, 03/23/19 Mearl Latin PT, DPT Physical Therapist at [facility]

## 2019-03-23 NOTE — Progress Notes (Signed)
Subjective:  Patient has no complaints.  She states she ate most of her breakfast and lunch.  She was with regular food.  She denies nausea vomiting or abdominal pain.  She still has not had stool sample collected.  She states she had a bowel movement this morning and she was incontinent.  She says it was moderate to large amount.  No melena or rectal bleeding.  She is convinced that she does not have insurance coverage that would pay for rehab in a nursing home and therefore declines to go there.  She wants to go home.  She does not feel that she is ready.  Objective: Blood pressure (!) 121/53, pulse 73, temperature 98.4 F (36.9 C), temperature source Oral, resp. rate 16, height '5\' 4"'$  (1.626 m), weight 53.1 kg, SpO2 100 %. Patient is alert and in no acute distress Abdomen is full but not distended.  Bowel sounds are hyperactive.  On palpation abdomen is soft.  She has mild tenderness in right lower quadrant on deep palpation.  No mass noted. Rectal examination performed.  No stool in the vault.  Foley's catheter was placed in the rectum with escape of flatus but no stool sample could be collected. No peripheral edema noted  Labs/studies Results:  CBC Latest Ref Rng & Units 03/23/2019 03/22/2019 03/21/2019  WBC 4.0 - 10.5 K/uL 5.4 7.1 7.8  Hemoglobin 12.0 - 15.0 g/dL 8.2(L) 8.2(L) 7.7(L)  Hematocrit 36.0 - 46.0 % 28.3(L) 28.7(L) 25.1(L)  Platelets 150 - 400 K/uL 247 250 268    CMP Latest Ref Rng & Units 03/23/2019 03/22/2019 03/21/2019  Glucose 70 - 99 mg/dL 82 71 93  BUN 8 - 23 mg/dL 24(H) 35(H) 41(H)  Creatinine 0.44 - 1.00 mg/dL 1.59(H) 1.81(H) 2.01(H)  Sodium 135 - 145 mmol/L 139 142 142  Potassium 3.5 - 5.1 mmol/L 3.5 3.6 3.8  Chloride 98 - 111 mmol/L 108 108 104  CO2 22 - 32 mmol/L '25 25 26  '$ Calcium 8.9 - 10.3 mg/dL 7.4(L) 7.4(L) 7.5(L)  Total Protein 6.5 - 8.1 g/dL - 4.5(L) 4.5(L)  Total Bilirubin 0.3 - 1.2 mg/dL - 0.6 0.7  Alkaline Phos 38 - 126 U/L - 60 58  AST 15 - 41 U/L - 26 25   ALT 0 - 44 U/L - 23 23    Hepatic Function Latest Ref Rng & Units 03/22/2019 03/21/2019 03/18/2019  Total Protein 6.5 - 8.1 g/dL 4.5(L) 4.5(L) 5.3(L)  Albumin 3.5 - 5.0 g/dL 2.3(L) 2.3(L) 2.7(L)  AST 15 - 41 U/L '26 25 18  '$ ALT 0 - 44 U/L '23 23 21  '$ Alk Phosphatase 38 - 126 U/L 60 58 64  Total Bilirubin 0.3 - 1.2 mg/dL 0.6 0.7 1.1  Bilirubin, Direct 0.0 - 0.2 mg/dL - - -    Stool studies are pending.  CEA 11.   Assessment:  #1.  Nausea vomiting and diarrhea.  Nausea vomiting has resolved with diarrhea has not.  Stool studies are still pending.  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  She was recently hospitalized for small bowel obstruction twice with this illness does not appear to be due to SBO.  Unable to obtain stool sample for placing Foley's cath in the rectum. I do not believe she is ready for discharge.  Would like to monitor for another 24 hours and make sure that she does not develop abdominal pain nausea or vomiting  #2.  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  CEA remains mildly elevated.  No evidence of residual disease.  #3.  Anemia.  Stool was heme positive on admission.  No overt GI bleed noted.  Her hemoglobin has remained stable during this admission.  If anything it has improved despite hydration.  She was noted to have rising MCV.  She has not taken B12 in a while.  She was given a dose yesterday.  #4.  Chronic kidney disease.  Continued improvement in renal function with hydration.   Recommendations  Nursing staff to make another effort to obtain stool sample after which she would be started on metronidazole to 50 mg by mouth 3 times a day. If she does well today she can go home tomorrow. She will need to continue metronidazole for 10 to 14 days.

## 2019-03-23 NOTE — Discharge Summary (Signed)
Physician Discharge Summary  [name] YKZ:[id number] DOB: 10/04/1928 DOA: 03/21/2019  PCP: Kathyrn Drown, MD GI: Rehman  Admit date: 03/21/2019 Discharge date: 03/23/2019  Admitted From:  Home Disposition:  Home with hospice   Recommendations for Outpatient Follow-up:  1. Follow up with PCP in 2 weeks  Discharge Condition: Hospice   CODE STATUS: DNR    Brief Hospitalization Summary: Please see all hospital notes, images, labs for full details of the hospitalization. HPI: [name] is a 86 y.o. 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  She reports that she has been having difficulty eating and drinking and having nausea vomiting and diarrhea.  The patient reports that she has been having difficulty with ADLs at home because she has been so weak.  She has been too weak to go to see her outpatient provider.  She returned to the emergency department because she did not know what else to do.  She has some cramping in her abdomen but is not having the abdominal bloating and distention that she had when she was diagnosed with SBO.       ED Course: Patient reports that she feels very weak.  Patient has a temperature of 97.3, pulse 82, blood pressure 122/63, pulse ox 98% on room air.  Patient was sent for abdominal x-rays but there were no findings of small bowel obstruction and there was a normal bowel gas pattern.  Patient was  strongly Hemoccult positive.  Her hemoglobin has drifted down in the last 2 weeks to 7.7 down from 10.1 in late December 2020.  Patient was given a bolus of IV fluids and GI was consulted.  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 She reports that she has been having difficulty eating and drinking and having nausea vomiting and diarrhea.   MDM/Assessment & Plan:   1. Mild dehydration-Resolved now.  Patient had poor oral intake  at home. She presented with diarrhea and nausea and vomiting. She had a mild AKI. Treating with IV fluid hydration. She is feeling much better and wanting to go home. 2. AKI - improved with IV fluids.  3. Nausea vomiting diarrhea-RESOLVED.  She was placed on enteric precautions. No recent antibiotic use.  4. Guaiac positive stools-She is heme positive. GI has seen her and bleeding likely from recent SBO. Hg has remained stable and no blood transfusion was needed. Hg stable. With her history of colon cancer and it appears that the cancer was removed with clear margins in September 2020.  5. Iron deficiency anemia due to chronic blood loss-Hg has been stable.  6. COPD-stable resume  home bronchodilators. 7. Stage IV CKD-creatinine stable. 8. DNR on admission-palliative medicine consult as patient needs to be at the end of life given multiple cancers and progressive debility.  Pt was  seen by palliative and elected to go home with hospice services.  9. Physical debility-PT recommending SNF but patient declining to go and prefers to go home with hospice services.    DVT prophylaxis:SCDs Code Status:Full Family Communication: Disposition Plan:Home with hospice services Consults called: Admission status:INP  Consultants:  GI  palliative  Procedures:    Antimicrobials:     Discharge Diagnoses:  Principal Problem:   Guaiac positive stools Active Problems:   Carotid artery disease (HCC)   Hyperlipidemia   Peripheral neuropathy   Prediabetes   COPD (chronic obstructive pulmonary disease) ([place])   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   CKD (chronic kidney disease), stage IV (HCC)   Iron deficiency anemia due to chronic blood loss   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)   S/P colon resection   Lower GI bleed from cecal cancer   DNR (do not resuscitate)   Goals of care, counseling/discussion   Palliative care by specialist   Encounter for hospice care discussion   Anemia   Gastrointestinal hemorrhage   Nausea vomiting and diarrhea   Discharge Instructions:  Allergies as of 03/23/2019      Reactions   Augmentin [amoxicillin-pot Clavulanate] Nausea And Vomiting   Has patient had a PCN reaction causing immediate rash, facial/tongue/throat swelling, SOB or lightheadedness with hypotension: No Has patient had a PCN reaction causing severe rash involving mucus membranes or skin necrosis: No Has patient had a PCN reaction that required hospitalization: No Has patient had a PCN reaction occurring within the last 10 years: Unknown If all of the above answers are "NO", then may proceed with Cephalosporin use.   Amoxicillin Diarrhea   Has patient had a PCN reaction causing immediate rash, facial/tongue/throat swelling, SOB or lightheadedness with hypotension: No Has patient had a PCN reaction causing severe rash  involving mucus membranes or skin necrosis: No Has patient had a PCN reaction that required hospitalization: No Has patient had a PCN reaction occurring within the last 10 years: Unknown If all of the above answers are "NO", then may proceed with Cephalosporin use.   Codeine Nausea And Vomiting   Morphine Nausea And Vomiting   Neomycin-bacitracin Zn-polymyx Itching, Rash   Unable to use Neosporin      Medication List    STOP taking these medications   furosemide 20 MG tablet Commonly known as: LASIX   potassium chloride 10 MEQ tablet Commonly known as: KLOR-CON     TAKE these medications   amLODipine 5 MG tablet Commonly known as: NORVASC TAKE ONE TABLET BY MOUTH ONCE DAILY.   anastrozole 1 MG tablet Commonly known as: ARIMIDEX Take 1 tablet (1 mg total) by mouth daily. What changed: when to take this   fluticasone 50 MCG/ACT nasal spray Commonly known as: FLONASE Place 2 sprays into both nostrils daily as needed for allergies.   hydrocortisone 2.5 % rectal cream Commonly known as: ANUSOL-HC Place 1 application rectally 2 (two) times daily as needed for hemorrhoids or anal itching.   levocetirizine 5 MG tablet Commonly known as: XYZAL Take 5 mg by mouth daily as needed for allergies.   multivitamin with minerals tablet Take 1 tablet by mouth daily.   nortriptyline 10 MG capsule Commonly known as: PAMELOR Take 1-2 capsules (10-20 mg total) by mouth  at bedtime. What changed: See the new instructions.   ondansetron 4 MG tablet Commonly known as: ZOFRAN Take 1 tablet (4 mg total) by mouth every 6 (six) hours as needed for nausea.   OXYGEN Inhale 2 L into the lungs. As needed for activities requiring exertion, per patient uses very sparingly   pantoprazole 40 MG tablet Commonly known as: Protonix Take 1 tablet (40 mg total) by mouth daily.   ProAir RespiClick 008 (90 Base) MCG/ACT Aepb Generic drug: Albuterol Sulfate INHALE 1 PUFF INTO THE LUNGS EVERY 4 HOURS  AS NEEDED. What changed: See the new instructions.       Contact information for follow-up providers    Luking, Elayne Snare, MD. Schedule an appointment as soon as possible for a visit in 2 week(s).   Specialty: Family Medicine Contact information: [place] [address] [phone]            Contact information for after-discharge care    [place] .   Service: Inpatient Hospice Contact information: [address] [address] [phone]                 Allergies  Allergen Reactions  . Augmentin [Amoxicillin-Pot Clavulanate] Nausea And Vomiting    Has patient had a PCN reaction causing immediate rash, facial/tongue/throat swelling, SOB or lightheadedness with hypotension: No Has patient had a PCN reaction causing severe rash involving mucus membranes or skin necrosis: No Has patient had a PCN reaction that required hospitalization: No Has patient had a PCN reaction occurring within the last 10 years: Unknown If all of the above answers are "NO", then may proceed with Cephalosporin use.    Marland Kitchen Amoxicillin Diarrhea    Has patient had a PCN reaction causing immediate rash, facial/tongue/throat swelling, SOB or lightheadedness with hypotension: No Has patient had a PCN reaction causing severe rash involving mucus membranes or skin necrosis: No Has patient had a PCN reaction that required hospitalization: No Has patient had a PCN reaction occurring within the last 10 years: Unknown If all of the above answers are "NO", then may proceed with Cephalosporin use.   . Codeine Nausea And Vomiting  . Morphine Nausea And Vomiting  . Neomycin-Bacitracin Zn-Polymyx Itching and Rash    Unable to use Neosporin   Allergies as of 03/23/2019      Reactions   Augmentin [amoxicillin-pot Clavulanate] Nausea And Vomiting   Has patient had a PCN reaction causing immediate rash, facial/tongue/throat  swelling, SOB or lightheadedness with hypotension: No Has patient had a PCN reaction causing severe rash involving mucus membranes or skin necrosis: No Has patient had a PCN reaction that required hospitalization: No Has patient had a PCN reaction occurring within the last 10 years: Unknown If all of the above answers are "NO", then may proceed with Cephalosporin use.   Amoxicillin Diarrhea   Has patient had a PCN reaction causing immediate rash, facial/tongue/throat swelling, SOB or lightheadedness with hypotension: No Has patient had a PCN reaction causing severe rash involving mucus membranes or skin necrosis: No Has patient had a PCN reaction that required hospitalization: No Has patient had a PCN reaction occurring within the last 10 years: Unknown If all of the above answers are "NO", then may proceed with Cephalosporin use.   Codeine Nausea And Vomiting   Morphine Nausea And Vomiting   Neomycin-bacitracin Zn-polymyx Itching, Rash   Unable to use Neosporin  Medication List    STOP taking these medications   furosemide 20 MG tablet Commonly known as: LASIX   potassium chloride 10 MEQ tablet Commonly known as: KLOR-CON     TAKE these medications   amLODipine 5 MG tablet Commonly known as: NORVASC TAKE ONE TABLET BY MOUTH ONCE DAILY.   anastrozole 1 MG tablet Commonly known as: ARIMIDEX Take 1 tablet (1 mg total) by mouth daily. What changed: when to take this   fluticasone 50 MCG/ACT nasal spray Commonly known as: FLONASE Place 2 sprays into both nostrils daily as needed for allergies.   hydrocortisone 2.5 % rectal cream Commonly known as: ANUSOL-HC Place 1 application rectally 2 (two) times daily as needed for hemorrhoids or anal itching.   levocetirizine 5 MG tablet Commonly known as: XYZAL Take 5 mg by mouth daily as needed for allergies.   multivitamin with minerals tablet Take 1 tablet by mouth daily.   nortriptyline 10 MG capsule Commonly known as:  PAMELOR Take 1-2 capsules (10-20 mg total) by mouth at bedtime. What changed: See the new instructions.   ondansetron 4 MG tablet Commonly known as: ZOFRAN Take 1 tablet (4 mg total) by mouth every 6 (six) hours as needed for nausea.   OXYGEN Inhale 2 L into the lungs. As needed for activities requiring exertion, per patient uses very sparingly   pantoprazole 40 MG tablet Commonly known as: Protonix Take 1 tablet (40 mg total) by mouth daily.   ProAir RespiClick 992 (90 Base) MCG/ACT Aepb Generic drug: Albuterol Sulfate INHALE 1 PUFF INTO THE LUNGS EVERY 4 HOURS AS NEEDED. What changed: See the new instructions.       Procedures/Studies: CT ABDOMEN PELVIS WO CONTRAST  Result Date: 03/15/2019 CLINICAL DATA:  Follow-up small-bowel obstruction EXAM: CT ABDOMEN AND PELVIS WITHOUT CONTRAST TECHNIQUE: Multidetector CT imaging of the abdomen and pelvis was performed following the standard protocol without IV contrast. COMPARISON:  03/08/2019 FINDINGS: Lower chest: No acute abnormality. Hepatobiliary: Gallbladder is partially contracted. Stable appearance of scattered low-density lesions throughout the liver including a subcapsular lesion at the posterior aspect of the right hepatic lobe containing dystrophic calcification. No definite new hepatic lesion identified. No biliary dilatation. Pancreas: Unremarkable. No pancreatic ductal dilatation or surrounding inflammatory changes. Spleen: Normal in size without focal abnormality. Adrenals/Urinary Tract: Adrenal glands are unremarkable. Kidneys are within normal limits without focal lesion or hydronephrosis. There are renal vascular calcifications. Bladder is unremarkable. Stomach/Bowel: Persistent small bowel obstruction with transition point in the right hemipelvis (series 6, images 30-34). The degree of bowel dilatation is similar to the previous exam. By the colon remains largely collapsed. Stomach is mildly distended as well.  Vascular/Lymphatic: Aortic atherosclerosis. No enlarged abdominal or pelvic lymph nodes. Reproductive: Status post hysterectomy. No adnexal masses. Other: No ascites. No pneumoperitoneum. No abdominal wall hernia. Musculoskeletal: No new or acute osseous findings. IMPRESSION: 1. Persistent small bowel obstruction with transition point in the right hemipelvis, likely secondary to adhesion. No free fluid or free air to suggest perforation. 2. Additional chronic findings, as above. Aortic Atherosclerosis (ICD10-I70.0). Electronically Signed   By: Davina Poke D.O.   On: 03/15/2019 18:43   CT ABDOMEN PELVIS WO CONTRAST  Result Date: 03/08/2019 CLINICAL DATA:  Abdominal pain, nausea, and vomiting for a week, history breast cancer, COPD, hypertension, diabetes mellitus EXAM: CT ABDOMEN AND PELVIS WITHOUT CONTRAST TECHNIQUE: Multidetector CT imaging of the abdomen and pelvis was performed following the standard protocol without IV contrast. Sagittal and coronal MPR images reconstructed from  axial data set. No oral contrast was administered. COMPARISON:  12/04/2018 FINDINGS: Lower chest: Chronic atelectasis versus scarring at RIGHT lower lobe. Remaining lung bases clear. Hepatobiliary: Gallbladder unremarkable. Multiple low-attenuation foci within liver likely representing small cysts. Additional cystic lesion with dystrophic calcification 2.6 x 2.4 cm image 17, posterior RIGHT lobe liver, unchanged. Pancreas: Normal appearance Spleen: Normal appearance Adrenals/Urinary Tract: Thickening of adrenal glands without discrete mass. Renal vascular calcifications. No definite renal mass or hydronephrosis. Bladder and ureters unremarkable. Stomach/Bowel: Prior ileocolic resection. Stomach unremarkable. Colon decompressed. Dilated proximal and decompressed distal small bowel loops compatible with small bowel obstruction. Transition from dilated to nondilated small bowel occurs in the RIGHT lower quadrant, favor adhesion.  Minimal scattered edema within mesentery. No definite bowel wall thickening. Vascular/Lymphatic: Atherosclerotic calcifications aorta and iliac arteries. Aorta normal caliber. No adenopathy. Reproductive: Uterus surgically absent. Nonvisualization of ovaries. Other: No free air or free fluid.  No hernia. Musculoskeletal: Osseous demineralization. IMPRESSION: Distal small bowel obstruction, suspect related to adhesion in the RIGHT upper pelvis. Stable hepatic cysts including a 2.6 x 2.4 cm cyst with dystrophic calcification at the posterior RIGHT lobe liver. Aortic Atherosclerosis (ICD10-I70.0). Electronically Signed   By: Lavonia Dana M.D.   On: 03/08/2019 18:58   DG Chest Portable 1 View  Result Date: 03/15/2019 CLINICAL DATA:  NG tube placement EXAM: PORTABLE CHEST 1 VIEW COMPARISON:  03/08/2019 FINDINGS: Suspected skin fold artifact over the right upper thorax. Clips at the right chest. No acute consolidation, pleural effusion or pneumothorax. Stable cardiomediastinal silhouette with aortic atherosclerosis. Esophageal tube tip is below the diaphragm but incompletely visualized. IMPRESSION: 1. Esophageal tube tip below the diaphragm but incompletely visualized. 2. No acute airspace disease. Electronically Signed   By: Donavan Foil M.D.   On: 03/15/2019 21:53   DG Chest Port 1 View  Result Date: 03/08/2019 CLINICAL DATA:  Abdominal pain with nausea and vomiting. EXAM: PORTABLE CHEST 1 VIEW COMPARISON:  Chest x-ray dated 03/28/2018 FINDINGS: The heart size and pulmonary vascularity are normal. Aortic atherosclerosis. The lungs are clear. No significant bone abnormality. IMPRESSION: 1. No active disease in the chest. 2.  Aortic Atherosclerosis (ICD10-I70.0). Electronically Signed   By: Lorriane Shire M.D.   On: 03/08/2019 16:28   Acute Abd Series  Result Date: 03/21/2019 CLINICAL DATA:  Nausea, vomiting, diarrhea, abdominal pain EXAM: DG ABDOMEN ACUTE W/ 1V CHEST COMPARISON:  03/09/2019 FINDINGS: There  is no evidence of dilated bowel loops or free intraperitoneal air. Heart size and mediastinal contours are within normal limits. Both lungs are clear. IMPRESSION: Negative abdominal radiographs.  No acute cardiopulmonary disease. Electronically Signed   By: Macy Mis M.D.   On: 03/21/2019 08:52   DG ABD ACUTE 2+V W 1V CHEST  Result Date: 03/09/2019 CLINICAL DATA:  Abdominal pain with nausea and vomiting. EXAM: DG ABDOMEN ACUTE W/ 1V CHEST COMPARISON:  Chest x-ray 03/08/2019 FINDINGS: Lungs are adequately inflated without focal airspace consolidation or effusion. Cardiomediastinal silhouette and remainder of the chest is unchanged. Abdominopelvic images demonstrate a nasogastric tube with tip over the stomach in the left upper quadrant. Bowel gas pattern is nonobstructive. There is no free peritoneal air. There are degenerative changes of the spine and hips. IMPRESSION: Negative abdominal radiographs.  No acute cardiopulmonary disease. Nasogastric tube with tip over the stomach in the left upper quadrant. Electronically Signed   By: Marin Olp M.D.   On: 03/09/2019 10:05      Subjective: Pt says she is feeling much better, wants to go  home with hospice services  Discharge Exam: Vitals:   03/22/19 1941 03/23/19 0608  BP:  (!) 121/53  Pulse:  73  Resp:  16  Temp:  98.4 F (36.9 C)  SpO2: 92% 100%   Vitals:   03/22/19 0556 03/22/19 1540 03/22/19 1941 03/23/19 0608  BP: (!) 129/52 120/64  (!) 121/53  Pulse: 66 76  73  Resp: 18 17  16   Temp: 98.2 F (36.8 C) 98.4 F (36.9 C)  98.4 F (36.9 C)  TempSrc: Oral Oral  Oral  SpO2: 97% 100% 92% 100%  Weight:      Height:       General: Pt is alert, awake, not in acute distress Cardiovascular: normal S1/S2 +, no rubs, no gallops Respiratory: CTA bilaterally, no wheezing, no rhonchi Abdominal: Soft, NT, ND, bowel sounds + Extremities: no edema, no cyanosis   The results of significant diagnostics from this hospitalization  (including imaging, microbiology, ancillary and laboratory) are listed below for reference.     Microbiology: Recent Results (from the past 240 hour(s))  SARS CORONAVIRUS 2 (TAT 6-24 HRS) Nasopharyngeal Nasopharyngeal Swab     Status: None   Collection Time: 03/15/19  8:57 PM   Specimen: Nasopharyngeal Swab  Result Value Ref Range Status   SARS Coronavirus 2 NEGATIVE NEGATIVE Final    Comment: (NOTE) SARS-CoV-2 target nucleic acids are NOT DETECTED. The SARS-CoV-2 RNA is generally detectable in upper and lower respiratory specimens during the acute phase of infection. Negative results do not preclude SARS-CoV-2 infection, do not rule out co-infections with other pathogens, and should not be used as the sole basis for treatment or other patient management decisions. Negative results must be combined with clinical observations, patient history, and epidemiological information. The expected result is Negative. Fact Sheet for Patients: SugarRoll.be Fact Sheet for Healthcare Providers: https://www.woods-mathews.com/ This test is not yet approved or cleared by the [place] FDA and  has been authorized for detection and/or diagnosis of SARS-CoV-2 by FDA under an Emergency Use Authorization (EUA). This EUA will remain  in effect (meaning this test can be used) for the duration of the COVID-19 declaration under Section 56 4(b)(1) of the Act, 21 U.S.C. section 360bbb-3(b)(1), unless the authorization is terminated or revoked sooner. Performed at [facility], [place] [address], [place], [place] [address]   SARS CORONAVIRUS 2 (TAT 6-24 HRS) Nasopharyngeal Nasopharyngeal Swab     Status: None   Collection Time: 03/21/19  1:17 PM   Specimen: Nasopharyngeal Swab  Result Value Ref Range Status   SARS Coronavirus 2 NEGATIVE NEGATIVE Final    Comment: (NOTE) SARS-CoV-2 target nucleic acids are NOT DETECTED. The SARS-CoV-2 RNA is generally  detectable in upper and lower respiratory specimens during the acute phase of infection. Negative results do not preclude SARS-CoV-2 infection, do not rule out co-infections with other pathogens, and should not be used as the sole basis for treatment or other patient management decisions. Negative results must be combined with clinical observations, patient history, and epidemiological information. The expected result is Negative. Fact Sheet for Patients: SugarRoll.be Fact Sheet for Healthcare Providers: https://www.woods-mathews.com/ This test is not yet approved or cleared by the [place] FDA and  has been authorized for detection and/or diagnosis of SARS-CoV-2 by FDA under an Emergency Use Authorization (EUA). This EUA will remain  in effect (meaning this test can be used) for the duration of the COVID-19 declaration under Section 56 4(b)(1) of the Act, 21 U.S.C. section 360bbb-3(b)(1), unless the authorization  is terminated or revoked sooner. Performed at [facility], [place] [address], [place], [place] [address]      Labs: BNP (last 3 results) Recent Labs    03/28/18 1634  BNP 626.9*   Basic Metabolic Panel: Recent Labs  Lab 03/17/19 0559 03/18/19 0617 03/19/19 0555 03/21/19 1050 03/22/19 0544 03/23/19 0751  NA 148* 152* 144 142 142 139  K 3.0* 3.0* 3.8 3.8 3.6 3.5  CL 105 100 102 104 108 108  CO2 24 28 32 26 25 25   GLUCOSE 79 83 122* 93 71 82  BUN 38* 33* 25* 41* 35* 24*  CREATININE 1.63* 1.76* 1.66* 2.01* 1.81* 1.59*  CALCIUM 7.5* 7.5* 7.1* 7.5* 7.4* 7.4*  MG 1.6* 2.5* 2.0  --  1.9 1.6*   Liver Function Tests: Recent Labs  Lab 03/17/19 0559 03/18/19 0617 03/21/19 1050 03/22/19 0544  AST 18 18 25 26   ALT 20 21 23 23   ALKPHOS 62 64 58 60  BILITOT 1.0 1.1 0.7 0.6  PROT 4.9* 5.3* 4.5* 4.5*  ALBUMIN 2.5* 2.7* 2.3* 2.3*   No results for input(s): LIPASE, AMYLASE in the last 168 hours. No results for  input(s): AMMONIA in the last 168 hours. CBC: Recent Labs  Lab 03/18/19 0617 03/19/19 0555 03/21/19 1050 03/22/19 0544 03/23/19 0751  WBC 11.1* 8.9 7.8 7.1 5.4  NEUTROABS  --   --  6.3 5.6 3.8  HGB 9.5* 7.8* 7.7* 8.2* 8.2*  HCT 31.5* 26.4* 25.1* 28.7* 28.3*  MCV 97.2 99.6 98.4 103.2* 101.4*  PLT 348 283 268 250 247   Cardiac Enzymes: No results for input(s): CKTOTAL, CKMB, CKMBINDEX, TROPONINI in the last 168 hours. BNP: Invalid input(s): POCBNP CBG: No results for input(s): GLUCAP in the last 168 hours. D-Dimer No results for input(s): DDIMER in the last 72 hours. Hgb A1c No results for input(s): HGBA1C in the last 72 hours. Lipid Profile No results for input(s): CHOL, HDL, LDLCALC, TRIG, CHOLHDL, LDLDIRECT in the last 72 hours. Thyroid function studies No results for input(s): TSH, T4TOTAL, T3FREE, THYROIDAB in the last 72 hours.  Invalid input(s): FREET3 Anemia work up No results for input(s): VITAMINB12, FOLATE, FERRITIN, TIBC, IRON, RETICCTPCT in the last 72 hours. Urinalysis    Component Value Date/Time   COLORURINE YELLOW 03/15/2019 1640   APPEARANCEUR HAZY (A) 03/15/2019 1640   LABSPEC 1.015 03/15/2019 1640   PHURINE 5.0 03/15/2019 1640   GLUCOSEU NEGATIVE 03/15/2019 1640   HGBUR NEGATIVE 03/15/2019 1640   BILIRUBINUR NEGATIVE 03/15/2019 1640   KETONESUR NEGATIVE 03/15/2019 1640   PROTEINUR NEGATIVE 03/15/2019 1640   NITRITE NEGATIVE 03/15/2019 1640   LEUKOCYTESUR NEGATIVE 03/15/2019 1640   Sepsis Labs Invalid input(s): PROCALCITONIN,  WBC,  LACTICIDVEN Microbiology Recent Results (from the past 240 hour(s))  SARS CORONAVIRUS 2 (TAT 6-24 HRS) Nasopharyngeal Nasopharyngeal Swab     Status: None   Collection Time: 03/15/19  8:57 PM   Specimen: Nasopharyngeal Swab  Result Value Ref Range Status   SARS Coronavirus 2 NEGATIVE NEGATIVE Final    Comment: (NOTE) SARS-CoV-2 target nucleic acids are NOT DETECTED. The SARS-CoV-2 RNA is generally detectable in  upper and lower respiratory specimens during the acute phase of infection. Negative results do not preclude SARS-CoV-2 infection, do not rule out co-infections with other pathogens, and should not be used as the sole basis for treatment or other patient management decisions. Negative results must be combined with clinical observations, patient history, and epidemiological information. The expected result is Negative. Fact Sheet for  Patients: SugarRoll.be Fact Sheet for Healthcare Providers: https://www.woods-mathews.com/ This test is not yet approved or cleared by the [place] FDA and  has been authorized for detection and/or diagnosis of SARS-CoV-2 by FDA under an Emergency Use Authorization (EUA). This EUA will remain  in effect (meaning this test can be used) for the duration of the COVID-19 declaration under Section 56 4(b)(1) of the Act, 21 U.S.C. section 360bbb-3(b)(1), unless the authorization is terminated or revoked sooner. Performed at [facility], [place] [address] [place], [place] [address]   SARS CORONAVIRUS 2 (TAT 6-24 HRS) Nasopharyngeal Nasopharyngeal Swab     Status: None   Collection Time: 03/21/19  1:17 PM   Specimen: Nasopharyngeal Swab  Result Value Ref Range Status   SARS Coronavirus 2 NEGATIVE NEGATIVE Final    Comment: (NOTE) SARS-CoV-2 target nucleic acids are NOT DETECTED. The SARS-CoV-2 RNA is generally detectable in upper and lower respiratory specimens during the acute phase of infection. Negative results do not preclude SARS-CoV-2 infection, do not rule out co-infections with other pathogens, and should not be used as the sole basis for treatment or other patient management decisions. Negative results must be combined with clinical observations, patient history, and epidemiological information. The expected result is Negative. Fact Sheet for Patients: SugarRoll.be Fact  Sheet for Healthcare Providers: https://www.woods-mathews.com/ This test is not yet approved or cleared by the [place] FDA and  has been authorized for detection and/or diagnosis of SARS-CoV-2 by FDA under an Emergency Use Authorization (EUA). This EUA will remain  in effect (meaning this test can be used) for the duration of the COVID-19 declaration under Section 56 4(b)(1) of the Act, 21 U.S.C. section 360bbb-3(b)(1), unless the authorization is terminated or revoked sooner. Performed at [facility], [employer] [employer] [address] [place], [place] [address]    Time coordinating discharge: 34 minutes   SIGNED:  Irwin Brakeman, MD  [facility] 03/23/2019, 11:24 AM How to contact the [facility] Attending or Consulting provider [place] or covering provider during after hours [place], for this patient?  1. Check the care team in [facility] and look for a) attending/consulting TRH provider listed and b) the [facility] team listed 2. Log into [email or web address] and use [place]'s universal password to access. If you do not have the password, please contact the hospital operator. 3. Locate the [facility] provider you are looking for under [facility] and page to a number that you can be directly reached. 4. If you still have difficulty reaching the provider, please page the [facility] (Director on Call) for the Hospitalists listed on amion for assistance.

## 2019-03-23 NOTE — Care Management Important Message (Signed)
Important Message  Patient Details  Name: [name] MRN: [id number] Date of Birth: 06/16/1928   Medicare Important Message Given:  Yes(Shannon, RN will deliver to patient due to contact precautions)     Tommy Medal 03/23/2019, 2:51 PM

## 2019-03-24 ENCOUNTER — Inpatient Hospital Stay (HOSPITAL_COMMUNITY): Payer: PPO

## 2019-03-24 DIAGNOSIS — K56609 Unspecified intestinal obstruction, unspecified as to partial versus complete obstruction: Secondary | ICD-10-CM | POA: Diagnosis present

## 2019-03-24 LAB — CBC WITH DIFFERENTIAL/PLATELET
Abs Immature Granulocytes: 0.04 10*3/uL (ref 0.00–0.07)
Basophils Absolute: 0 10*3/uL (ref 0.0–0.1)
Basophils Relative: 0 %
Eosinophils Absolute: 0 10*3/uL (ref 0.0–0.5)
Eosinophils Relative: 1 %
HCT: 28.2 % — ABNORMAL LOW (ref 36.0–46.0)
Hemoglobin: 8.4 g/dL — ABNORMAL LOW (ref 12.0–15.0)
Immature Granulocytes: 1 %
Lymphocytes Relative: 19 %
Lymphs Abs: 1.3 10*3/uL (ref 0.7–4.0)
MCH: 29.8 pg (ref 26.0–34.0)
MCHC: 29.8 g/dL — ABNORMAL LOW (ref 30.0–36.0)
MCV: 100 fL (ref 80.0–100.0)
Monocytes Absolute: 0.4 10*3/uL (ref 0.1–1.0)
Monocytes Relative: 6 %
Neutro Abs: 4.9 10*3/uL (ref 1.7–7.7)
Neutrophils Relative %: 73 %
Platelets: 223 10*3/uL (ref 150–400)
RBC: 2.82 MIL/uL — ABNORMAL LOW (ref 3.87–5.11)
RDW: 15.5 % (ref 11.5–15.5)
WBC: 6.6 10*3/uL (ref 4.0–10.5)
nRBC: 0 % (ref 0.0–0.2)

## 2019-03-24 LAB — LACTOFERRIN, FECAL, QUALITATIVE: Lactoferrin, Fecal, Qual: NEGATIVE

## 2019-03-24 LAB — MAGNESIUM: Magnesium: 2 mg/dL (ref 1.7–2.4)

## 2019-03-24 MED ORDER — ALUM & MAG HYDROXIDE-SIMETH 200-200-20 MG/5ML PO SUSP
30.0000 mL | Freq: Once | ORAL | Status: AC
  Administered 2019-03-24: 30 mL via ORAL
  Filled 2019-03-24: qty 30

## 2019-03-24 MED ORDER — PANTOPRAZOLE SODIUM 40 MG IV SOLR
40.0000 mg | INTRAVENOUS | Status: DC
  Administered 2019-03-25 – 2019-03-26 (×2): 40 mg via INTRAVENOUS
  Filled 2019-03-24 (×3): qty 40

## 2019-03-24 MED ORDER — SIMETHICONE 80 MG PO CHEW
80.0000 mg | CHEWABLE_TABLET | Freq: Once | ORAL | Status: AC
  Administered 2019-03-24: 80 mg via ORAL
  Filled 2019-03-24: qty 1

## 2019-03-24 NOTE — Progress Notes (Signed)
PROGRESS NOTE [facility] [place] [facility]   [name]  XLK:[id number]  DOB: [birth year]  DOA: 03/21/2019 PCP: Kathyrn Drown, MD   Brief Admission Hx: 86 y.o. 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  She reports that she has been having difficulty eating and drinking and having nausea vomiting and diarrhea.   MDM/Assessment & Plan:    1. Recurrent SBO - pt started having recurrent symptoms after lunch, xray confirms recurrence, patient having flatus, no BM today, change diet to clears, will ask surgery to see in AM, I worry that patient will have to be on permanent liquid diet.  Repeat abdominal xrays in AM.  2. Mild dehydration-Improved with IV fluids.   Patient has had poor oral intake since being at home.  She presented with diarrhea and nausea and vomiting which has resolved.  She has a mild AKI.  Treating with IV fluid hydration.  Monitor electrolytes closely. 3. AKI - improved with IV fluids.  4. Nausea vomiting diarrhea-resolved now.   No recent antibiotic use.  GI panel pending. 5. Abdominal pain - secondary to mild recurrent SBO.  Following.  6. Guaiac positive stools-She is heme positive.  GI is following. Hg stable.  With her history of colon cancer and it appears that the cancer was removed with clear margins in September 2020.  recheck CBC in AM.  7. Iron deficiency anemia due to chronic blood loss-type and screen and  transfuse if needed. 8. COPD-stable resume home bronchodilators. 9. Stage IV CKD-monitor creatinine daily with hydration. 10. DNR on admission-palliative medicine consult as patient needs to be at the end of life given multiple cancers and progressive debility. 11. Physical debility-PT recommending SNF but patient declining to go but agreed to hospice.    DVT prophylaxis: SCDs Code Status: Full Family Communication:   Disposition Plan: admit for IV fluids and GI consult Consults called:   Admission status:  INP  Consultants:  GI  palliative  Procedures:    Antimicrobials:     Subjective: Pt reports that after lunch she started having abdominal pain and passing flatus, but no BM.   Objective: Vitals:   03/23/19 0608 03/23/19 1406 03/23/19 2231 03/24/19 0547  BP: (!) 121/53 129/68 (!) 119/54 116/61  Pulse: 73 77 81 88  Resp: 16 18 16 17   Temp: 98.4 F (36.9 C) 98.6 F (37 C) 98.9 F (37.2 C) 99.4 F (37.4 C)  TempSrc: Oral Oral Oral Oral  SpO2: 100% 100% 97% 96%  Weight:      Height:        Intake/Output Summary (Last 24 hours) at 03/24/2019 1400 Last data filed at 03/24/2019 1319 Gross per 24 hour  Intake 360 ml  Output 900 ml  Net -540 ml   Filed Weights   03/21/19 0747 03/21/19 1936  Weight: 55.2 kg 53.1 kg   REVIEW OF SYSTEMS  As per history otherwise all reviewed and reported negative  Exam:  General exam: frail elderly female, awake, alert, NAD.  Respiratory system:  No increased work of breathing. Cardiovascular system: normal  S1 & S2 heard. No JVD, murmurs, gallops, clicks or pedal edema. Gastrointestinal system: Abdomen is nondistended, soft and nontender. Normal bowel sounds heard. Central nervous system: Alert and oriented. No focal neurological deficits. Extremities: no CCE.   Data Reviewed: Basic Metabolic Panel: Recent Labs  Lab 03/18/19 0617 03/19/19 0555 03/21/19 1050 03/22/19 0544 03/23/19 0751 03/24/19 0813  NA 152* 144 142  142 139  --   K 3.0* 3.8 3.8 3.6 3.5  --   CL 100 102 104 108 108  --   CO2 28 32 26 25 25   --   GLUCOSE 83 122* 93 71 82  --   BUN 33* 25* 41* 35* 24*  --   CREATININE 1.76* 1.66* 2.01* 1.81* 1.59*  --   CALCIUM 7.5* 7.1* 7.5* 7.4* 7.4*  --   MG 2.5* 2.0  --  1.9 1.6* 2.0   Liver Function Tests: Recent Labs  Lab 03/18/19 0617 03/21/19 1050 03/22/19 0544  AST 18 25 26   ALT 21 23 23   ALKPHOS 64 58 60  BILITOT 1.1 0.7 0.6  PROT 5.3* 4.5* 4.5*  ALBUMIN 2.7* 2.3* 2.3*   No results for input(s): LIPASE, AMYLASE in the last 168 hours. No results for input(s): AMMONIA in the last 168 hours. CBC: Recent Labs  Lab 03/18/19 0617 03/19/19 0555 03/21/19 1050 03/22/19 0544 03/23/19 0751  WBC 11.1* 8.9 7.8 7.1 5.4  NEUTROABS  --   --  6.3 5.6 3.8  HGB 9.5* 7.8* 7.7* 8.2* 8.2*  HCT 31.5* 26.4* 25.1* 28.7* 28.3*  MCV 97.2 99.6 98.4 103.2* 101.4*  PLT 348 283 268 250 247   Cardiac Enzymes: No results for input(s): CKTOTAL, CKMB, CKMBINDEX, TROPONINI in the last 168 hours. CBG (last 3)  No results for input(s): GLUCAP in the last 72 hours. Recent Results (from the past 240 hour(s))  SARS CORONAVIRUS 2 (TAT 6-24 HRS) Nasopharyngeal Nasopharyngeal Swab     Status: None   Collection Time: 03/15/19  8:57 PM   Specimen: Nasopharyngeal Swab  Result Value Ref Range Status   SARS Coronavirus 2 NEGATIVE NEGATIVE Final    Comment: (NOTE) SARS-CoV-2 target nucleic acids are NOT DETECTED. The SARS-CoV-2 RNA is generally detectable in upper and lower respiratory specimens during the acute phase of infection. Negative results do not preclude SARS-CoV-2 infection, do not rule out co-infections with other pathogens, and should not be used as the sole basis for treatment or other patient management decisions. Negative results must be combined with clinical observations, patient history, and epidemiological information. The expected result is Negative. Fact Sheet for  Patients: SugarRoll.be Fact Sheet for Healthcare Providers: https://www.woods-mathews.com/ This test is not yet approved or cleared by the [place] FDA and  has been authorized for detection and/or diagnosis of SARS-CoV-2 by FDA under an Emergency Use Authorization (EUA). This EUA will remain  in effect (meaning this test can be used) for the duration of the COVID-19 declaration under Section 56 4(b)(1) of the Act, 21 U.S.C. section 360bbb-3(b)(1), unless the authorization is terminated or revoked sooner. Performed at [facility], [place] [address], [place], [place] [address]   SARS CORONAVIRUS 2 (TAT 6-24 HRS) Nasopharyngeal Nasopharyngeal Swab     Status: None   Collection Time: 03/21/19  1:17 PM   Specimen: Nasopharyngeal Swab  Result Value Ref Range Status   SARS Coronavirus 2 NEGATIVE NEGATIVE Final    Comment: (NOTE) SARS-CoV-2 target nucleic acids are NOT DETECTED. The SARS-CoV-2 RNA is generally detectable in upper  and lower respiratory specimens during the acute phase of infection. Negative results do not preclude SARS-CoV-2 infection, do not rule out co-infections with other pathogens, and should not be used as the sole basis for treatment or other patient management decisions. Negative results must be combined with clinical observations, patient history, and epidemiological information. The expected result is Negative. Fact Sheet for Patients: SugarRoll.be Fact Sheet for Healthcare Providers: https://www.woods-mathews.com/ This test is not yet approved or cleared by the [place] FDA and  has been authorized for detection and/or diagnosis of SARS-CoV-2 by FDA under an Emergency Use Authorization (EUA). This EUA will remain  in effect (meaning this test can be used) for the duration of the COVID-19 declaration under Section 56 4(b)(1) of the Act, 21 U.S.C. section  360bbb-3(b)(1), unless the authorization is terminated or revoked sooner. Performed at [facility], [place] [address] [place], [place] [address]      Studies: DG Abd Portable 1V  Result Date: 03/24/2019 CLINICAL DATA:  History of colonic resection September 2020. History of small-bowel obstructions. History of breast cancer. EXAM: PORTABLE ABDOMEN - 1 VIEW COMPARISON:  March 21, 2019 FINDINGS: Dilated loops of small bowel are identified consistent with small-bowel obstruction. Evaluation for free air is limited due to the lack of upright imaging or decubitus imaging. IMPRESSION: Small bowel obstruction. Evaluation for free air is limited as above. Electronically Signed   By: Dorise Bullion III M.D   On: 03/24/2019 13:06   Scheduled Meds: . amLODipine  5 mg Oral Daily  . anastrozole  1 mg Oral QPM  . metroNIDAZOLE  250 mg Oral TID PC  . multivitamin with minerals  1 tablet Oral Daily  . [START ON 03/25/2019] pantoprazole (PROTONIX) IV  40 mg Intravenous Q24H  . QUEtiapine  25 mg Oral QHS   Continuous Infusions: . 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  1. Check the care team in [facility] and look for a) attending/consulting TRH provider listed and b) the [facility] team listed 2. Log into [email or web address] and use [place]'s universal password to access. If you do not have the password, please contact the hospital operator. 3. Locate the [facility] provider you are looking for under [facility] and page to a number that you can be directly reached. 4. If you still have difficulty reaching the provider, please page the [facility] (Director on Call) for the Hospitalists listed on amion for assistance.

## 2019-03-24 NOTE — TOC Transition Note (Signed)
Transition of Care [facility]) - CM/SW Discharge Note   Patient Details  Name: [name] MRN: [id number] Date of Birth: November 24, 1928  Transition of Care [facility]) CM/SW Contact:  Ihor Gully, LCSW Phone Number: 03/24/2019, 12:03 PM   Clinical Narrative:    Damaris Schooner with Shirlean Mylar at [facility]. Advised that patient was discharging today. Shirlean Mylar will notify on-call nurse of discharge.  TOC Signing off.   Final next level of care: Home w Hospice Care Barriers to Discharge: Barriers Resolved   Patient Goals and CMS Choice Patient states their goals for this hospitalization and ongoing recovery are:: to go home with hospice.      Discharge Placement                    Patient and family notified of of transfer: 03/23/19  Discharge Plan and Services                                     Social Determinants of Health (SDOH) Interventions     Readmission Risk Interventions Readmission Risk Prevention Plan 03/12/2019  Transportation Screening Complete  Medication Review Press photographer) Complete  PCP or Specialist appointment within 3-5 days of discharge Complete  HRI or Home Care Consult Patient refused  SW Recovery Care/Counseling Consult Patient refused  Palliative Care Screening Not [place] Not Applicable  Some recent data might be hidden

## 2019-03-24 NOTE — TOC Progression Note (Signed)
Transition of Care [facility]) - Progression Note    Patient Details  Name: [name] MRN: [id number] Date of Birth: 08/29/1928  Transition of Care [facility]) CM/SW Contact  Ihor Gully, LCSW Phone Number: 03/24/2019, 1:49 PM  Clinical Narrative:    Cecilie Lowers at [facility] that patient will not discharge today due to abdominal complaints.    Expected Discharge Plan: Home w Hospice Care Barriers to Discharge: Barriers Resolved  Expected Discharge Plan and Services Expected Discharge Plan: [place]         Expected Discharge Date: 03/23/19                                     Social Determinants of Health (SDOH) Interventions    Readmission Risk Interventions Readmission Risk Prevention Plan 03/12/2019  Transportation Screening Complete  Medication Review Press photographer) Complete  PCP or Specialist appointment within 3-5 days of discharge Complete  HRI or Home Care Consult Patient refused  SW Recovery Care/Counseling Consult Patient refused  Palliative Care Screening Not [place] Not Applicable  Some recent data might be hidden

## 2019-03-25 ENCOUNTER — Inpatient Hospital Stay (HOSPITAL_COMMUNITY): Payer: PPO

## 2019-03-25 DIAGNOSIS — K56600 Partial intestinal obstruction, unspecified as to cause: Secondary | ICD-10-CM | POA: Diagnosis not present

## 2019-03-25 LAB — CBC WITH DIFFERENTIAL/PLATELET
Abs Immature Granulocytes: 0.04 10*3/uL (ref 0.00–0.07)
Basophils Absolute: 0 10*3/uL (ref 0.0–0.1)
Basophils Relative: 0 %
Eosinophils Absolute: 0 10*3/uL (ref 0.0–0.5)
Eosinophils Relative: 0 %
HCT: 28.7 % — ABNORMAL LOW (ref 36.0–46.0)
Hemoglobin: 8.5 g/dL — ABNORMAL LOW (ref 12.0–15.0)
Immature Granulocytes: 1 %
Lymphocytes Relative: 16 %
Lymphs Abs: 1.1 10*3/uL (ref 0.7–4.0)
MCH: 29.6 pg (ref 26.0–34.0)
MCHC: 29.6 g/dL — ABNORMAL LOW (ref 30.0–36.0)
MCV: 100 fL (ref 80.0–100.0)
Monocytes Absolute: 0.4 10*3/uL (ref 0.1–1.0)
Monocytes Relative: 6 %
Neutro Abs: 5.5 10*3/uL (ref 1.7–7.7)
Neutrophils Relative %: 77 %
Platelets: 225 10*3/uL (ref 150–400)
RBC: 2.87 MIL/uL — ABNORMAL LOW (ref 3.87–5.11)
RDW: 15.6 % — ABNORMAL HIGH (ref 11.5–15.5)
WBC: 7.1 10*3/uL (ref 4.0–10.5)
nRBC: 0 % (ref 0.0–0.2)

## 2019-03-25 LAB — BASIC METABOLIC PANEL
Anion gap: 8 (ref 5–15)
BUN: 27 mg/dL — ABNORMAL HIGH (ref 8–23)
CO2: 24 mmol/L (ref 22–32)
Calcium: 7.8 mg/dL — ABNORMAL LOW (ref 8.9–10.3)
Chloride: 107 mmol/L (ref 98–111)
Creatinine, Ser: 1.83 mg/dL — ABNORMAL HIGH (ref 0.44–1.00)
GFR calc Af Amer: 28 mL/min — ABNORMAL LOW (ref >60)
GFR calc non Af Amer: 24 mL/min — ABNORMAL LOW (ref >60)
Glucose, Bld: 104 mg/dL — ABNORMAL HIGH (ref 70–99)
Potassium: 3.5 mmol/L (ref 3.5–5.1)
Sodium: 139 mmol/L (ref 135–145)

## 2019-03-25 MED ORDER — POLYETHYLENE GLYCOL 3350 17 G PO PACK
17.0000 g | PACK | Freq: Two times a day (BID) | ORAL | Status: DC
  Administered 2019-03-25 – 2019-03-26 (×4): 17 g via ORAL
  Filled 2019-03-25 (×5): qty 1

## 2019-03-25 MED ORDER — ENSURE ENLIVE PO LIQD
237.0000 mL | Freq: Two times a day (BID) | ORAL | Status: DC
  Administered 2019-03-25 – 2019-03-26 (×2): 237 mL via ORAL

## 2019-03-25 MED ORDER — FENTANYL CITRATE (PF) 100 MCG/2ML IJ SOLN
12.5000 ug | INTRAMUSCULAR | Status: DC | PRN
  Administered 2019-03-26: 12.5 ug via INTRAVENOUS
  Filled 2019-03-25: qty 2

## 2019-03-25 NOTE — Consult Note (Signed)
Reason for Consult: Recurrent partial bowel obstruction Referring Physician: Dr. [name] is an 86 y.o. female.  HPI: Patient is an 86 year old white female well-known to our service who was readmitted to the hospital with partial small bowel obstruction, nausea and vomiting, deconditioning, stage III chronic kidney disease, history of colon cancer, and history of urothelial carcinoma was also noted to have some anemia.  Her diet was advanced, but yesterday started having nausea and vomiting.  She was placed back on a clear liquid diet.  In talking with the patient, she is very frustrated that she has not gotten better.  She has stated multiple in the past that if surgery was needed, she went to have surgery performed by [facility].  She is frustrated that we have not been able to get her better, required multiple admissions.  I have reiterated to her that we can only do so much from a medical standpoint and that surgical intervention for adhesive disease is an option.  She has consistently refused any surgical option.  She currently has minimal abdominal pain.  Past Medical History:  Diagnosis Date  . Breast cancer ([place])   . Breast cancer, right breast ([place]) 05/01/2015  . Cancer (HCC)    BREAST CANCER   . Colon polyps   . COPD (chronic obstructive pulmonary disease) ([place])    DR. [place]  . Diabetes mellitus without complication ([place])    PREDIABETIC  CONTROL W/ DIET  . Hypercholesteremia   . Hypertension   . Lung nodules   . Osteoarthritis   . Osteopenia 09/18/2013  . Osteoporosis   . Personal history of radiation therapy   . PONV (postoperative nausea and vomiting)    ' i get so sick with anesthesia"   . Shortness of breath dyspnea    W/ EXERTION  . Tick bite    3 years ago     Past Surgical History:  Procedure Laterality Date  . ABDOMINAL HYSTERECTOMY    . APPENDECTOMY    . BARTHOLIN GLAND CYST EXCISION    . BIOPSY  12/06/2018   Procedure:  BIOPSY;  Surgeon: Rogene Houston, MD;  [facility];  Service: Endoscopy;;  cecal mass biopsies   . BREAST EXCISIONAL BIOPSY Left    benign  . BREAST LUMPECTOMY WITH RADIOACTIVE SEED LOCALIZATION Right 05/23/2015   Procedure: RIGHT BREAST LUMPECTOMY WITH RADIOACTIVE SEED LOCALIZATION;  Surgeon: Fanny Skates, MD;  Location: [place];  Service: General;  Laterality: Right;  . BREAST SURGERY     2000  LEFT BREAST  LUMP REMOVED  . cataract surgeries  Bilateral   . COLON RESECTION N/A 12/08/2018   Procedure: OPEN SEGMENTAL COLON RESECTION;  Surgeon: Fanny Skates, MD;  [facility];  Service: General;  Laterality: N/A;  . colonoscopy    . COLONOSCOPY  03/25/2011   Procedure: COLONOSCOPY;  Surgeon: Rogene Houston, MD;  [facility];  Service: Endoscopy;  Laterality: N/A;  9:30 am  . COLONOSCOPY N/A 12/06/2018   Procedure: COLONOSCOPY;  Surgeon: Rogene Houston, MD;  [facility];  Service: Endoscopy;  Laterality: N/A;  . CYSTOSCOPY W/ URETERAL STENT PLACEMENT Left 02/27/2018   Procedure: CYSTOSCOPY WITH LEFT RETROGRADE PYELOGRAM AND lEFT URETEROSCOPY WITH BIOPSY AND LEFT URETERAL STENT PLACEMENT;  Surgeon: Cleon Gustin, MD;  [facility];  Service: Urology;  Laterality: Left;  . CYSTOSCOPY W/ URETERAL STENT PLACEMENT Right 12/08/2018   Procedure: CYSTOSCOPY WITH RETROGRADE PYELOGRAM/URETERAL STENT PLACEMENT;  Surgeon: Raynelle Bring, MD;  [facility];  Service: Urology;  Laterality: Right;  . left inguinal hernia repair    . multiple toe surgeries    . ROBOT ASSITED LAPAROSCOPIC NEPHROURETERECTOMY Left 05/11/2018   Procedure: XI ROBOT ASSITED LAPAROSCOPIC LEFT DISTAL URETERECTOMY, URETERAL  REIMPLANTATION, WITH PSOAS HITCH AND BOARI FLAP. PELVIC LYMPH NODE DISSCETION AND LEFT URETERAL STENT PLACEMENT;  Surgeon: Cleon Gustin, MD;  [facility];  Service: Urology;  Laterality: Left;  . TONSILLECTOMY      Family History  Problem  Relation Age of Onset  . Colon cancer Father     Social History:  reports that she quit smoking about 31 years ago. Her smoking use included cigarettes. She has a 100.00 pack-year smoking history. She has never used smokeless tobacco. She reports current alcohol use. She reports that she does not use drugs.  Allergies:  Allergies  Allergen Reactions  . Augmentin [Amoxicillin-Pot Clavulanate] Nausea And Vomiting    Has patient had a PCN reaction causing immediate rash, facial/tongue/throat swelling, SOB or lightheadedness with hypotension: No Has patient had a PCN reaction causing severe rash involving mucus membranes or skin necrosis: No Has patient had a PCN reaction that required hospitalization: No Has patient had a PCN reaction occurring within the last 10 years: Unknown If all of the above answers are "NO", then may proceed with Cephalosporin use.    Marland Kitchen Amoxicillin Diarrhea    Has patient had a PCN reaction causing immediate rash, facial/tongue/throat swelling, SOB or lightheadedness with hypotension: No Has patient had a PCN reaction causing severe rash involving mucus membranes or skin necrosis: No Has patient had a PCN reaction that required hospitalization: No Has patient had a PCN reaction occurring within the last 10 years: Unknown If all of the above answers are "NO", then may proceed with Cephalosporin use.   . Codeine Nausea And Vomiting  . Morphine Nausea And Vomiting  . Neomycin-Bacitracin Zn-Polymyx Itching and Rash    Unable to use Neosporin    Medications: I have reviewed the patient's current medications.  Results for orders placed or performed during the hospital encounter of 03/21/19 (from the past 48 hour(s))  Lactoferrin, Fecal,Qualitative     Status: None   Collection Time: 03/23/19  1:56 PM  Result Value Ref Range   Lactoferrin, Fecal, Qual NEGATIVE NEGATIVE    Comment: Performed at [facility], [address] [place], [place] [address]   Magnesium     Status: None   Collection Time: 03/24/19  8:13 AM  Result Value Ref Range   Magnesium 2.0 1.7 - 2.4 mg/dL    Comment: Performed at [facility], [address] [place], [place] [address]  CBC with Differential/Platelet     Status: Abnormal   Collection Time: 03/24/19  4:39 PM  Result Value Ref Range   WBC 6.6 4.0 - 10.5 K/uL   RBC 2.82 (L) 3.87 - 5.11 MIL/uL   Hemoglobin 8.4 (L) 12.0 - 15.0 g/dL   HCT 28.2 (L) 36.0 - 46.0 %   MCV 100.0 80.0 - 100.0 fL   MCH 29.8 26.0 - 34.0 pg   MCHC 29.8 (L) 30.0 - 36.0 g/dL   RDW 15.5 11.5 - 15.5 %   Platelets 223 150 - 400 K/uL   nRBC 0.0 0.0 - 0.2 %   Neutrophils Relative % 73 %   Neutro Abs 4.9 1.7 - 7.7 K/uL   Lymphocytes Relative 19 %   Lymphs Abs 1.3 0.7 - 4.0  K/uL   Monocytes Relative 6 %   Monocytes Absolute 0.4 0.1 - 1.0 K/uL   Eosinophils Relative 1 %   Eosinophils Absolute 0.0 0.0 - 0.5 K/uL   Basophils Relative 0 %   Basophils Absolute 0.0 0.0 - 0.1 K/uL   Immature Granulocytes 1 %   Abs Immature Granulocytes 0.04 0.00 - 0.07 K/uL    Comment: Performed at [facility], [address], [place], [place] [address]  CBC WITH DIFFERENTIAL     Status: Abnormal   Collection Time: 03/25/19  6:31 AM  Result Value Ref Range   WBC 7.1 4.0 - 10.5 K/uL   RBC 2.87 (L) 3.87 - 5.11 MIL/uL   Hemoglobin 8.5 (L) 12.0 - 15.0 g/dL   HCT 28.7 (L) 36.0 - 46.0 %   MCV 100.0 80.0 - 100.0 fL   MCH 29.6 26.0 - 34.0 pg   MCHC 29.6 (L) 30.0 - 36.0 g/dL   RDW 15.6 (H) 11.5 - 15.5 %   Platelets 225 150 - 400 K/uL   nRBC 0.0 0.0 - 0.2 %   Neutrophils Relative % 77 %   Neutro Abs 5.5 1.7 - 7.7 K/uL   Lymphocytes Relative 16 %   Lymphs Abs 1.1 0.7 - 4.0 K/uL   Monocytes Relative 6 %   Monocytes Absolute 0.4 0.1 - 1.0 K/uL   Eosinophils Relative 0 %   Eosinophils Absolute 0.0 0.0 - 0.5 K/uL   Basophils Relative 0 %   Basophils Absolute 0.0 0.0 - 0.1 K/uL   Immature Granulocytes 1 %   Abs Immature Granulocytes 0.04 0.00 - 0.07 K/uL     Comment: Performed at [facility], [address], [place], [place] [address]  Basic metabolic panel     Status: Abnormal   Collection Time: 03/25/19  6:31 AM  Result Value Ref Range   Sodium 139 135 - 145 mmol/L   Potassium 3.5 3.5 - 5.1 mmol/L   Chloride 107 98 - 111 mmol/L   CO2 24 22 - 32 mmol/L   Glucose, Bld 104 (H) 70 - 99 mg/dL   BUN 27 (H) 8 - 23 mg/dL   Creatinine, Ser 1.83 (H) 0.44 - 1.00 mg/dL   Calcium 7.8 (L) 8.9 - 10.3 mg/dL   GFR calc non Af Amer 24 (L) >60 mL/min   GFR calc Af Amer 28 (L) >60 mL/min   Anion gap 8 5 - 15    Comment: Performed at [facility], [address], [place], [place] [address]    DG Abd 2 Views  Result Date: 03/25/2019 CLINICAL DATA:  Small bowel obstruction. EXAM: ABDOMEN - 2 VIEW COMPARISON:  03/24/2019 FINDINGS: There is moderate dilatation of multiple small bowel loops without significant interval change. No intraperitoneal free air is identified. There is mild lumbar scoliosis. IMPRESSION: Unchanged small bowel dilatation consistent with obstruction. Electronically Signed   By: Logan Bores M.D.   On: 03/25/2019 07:54   DG Abd Portable 1V  Result Date: 03/24/2019 CLINICAL DATA:  History of colonic resection September 2020. History of small-bowel obstructions. History of breast cancer. EXAM: PORTABLE ABDOMEN - 1 VIEW COMPARISON:  March 21, 2019 FINDINGS: Dilated loops of small bowel are identified consistent with small-bowel obstruction. Evaluation for free air is limited due to the lack of upright imaging or decubitus imaging. IMPRESSION: Small bowel obstruction. Evaluation for free air is limited as above. Electronically Signed   By: Dorise Bullion III M.D   On: 03/24/2019 13:06    ROS:  Pertinent items are noted in HPI.  Blood pressure 127/66, pulse 89, temperature 98.5 F (36.9 C), temperature source Oral, resp. rate 18, height 5\' 4"  (1.626 m), weight 53.1 kg, SpO2 94 %. Physical Exam: Elderly white female in no acute distress Head is  normocephalic, atraumatic Lungs clear to auscultation with good breath sounds bilaterally Heart examination reveals regular rate and rhythm Abdomen is soft with minimal bowel sounds appreciated.  Slight distention is noted.  There is no point tenderness present.  KUB this morning reveals no significant interval change in her small bowel dilatation  Assessment/Plan: Impression: Recurrent partial bowel obstruction most likely secondary to adhesive disease.  Deconditioning with evidence of bowel dysfunction that may be related to her other comorbidities as well as adhesive disease.  Patient is a high risk for any surgical intervention given her overall state.  I understand that she is being evaluated for palliative care.  She does not need acute surgical intervention.  Will add MiraLAX and try a full liquid diet with Ensure supplementation.  Will repeat KUB in a.m.  [name] 03/25/2019, 9:41 AM

## 2019-03-25 NOTE — Progress Notes (Signed)
PROGRESS NOTE [name]   [name]  OXB:[id number]  DOB: September 05, 1928  DOA: 03/21/2019 PCP: Kathyrn Drown, MD   Brief Admission Hx: 86 y.o. 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  She reports that she has been having difficulty eating and drinking and having nausea vomiting and diarrhea.   MDM/Assessment & Plan:    1. Recurrent partial SBO - pt started having recurrent symptoms after lunch 1/9, xray confirms recurrence of partial SBO, patient having flatus, no BMs, continue liquid diet, appreciate surgery consult, I worry that patient will have to be on permanent liquid diet and does not tolerate soft diet at all.  Repeat abdominal xrays in AM.  2. Mild dehydration-Improved with IV fluids.   Patient has had poor oral intake since being at home.  She presented with diarrhea and nausea and vomiting which has resolved.  She has a mild AKI.  Treating with IV fluid hydration.  Monitor electrolytes closely. 3. AKI - improved with IV fluids.  4. Nausea vomiting diarrhea-resolved now.   No recent antibiotic use.  GI panel pending. 5. Abdominal pain - secondary to recurrent partial SBO.  IV pain meds as needed. 6. Guaiac positive stools-She is heme positive.  GI is following. Hg stable.  With her history of colon cancer and it appears that the cancer was removed with clear margins in September 2020.  recheck CBC in AM.  7. Iron  deficiency anemia due to chronic blood loss-type and screen and transfuse if needed. 8. COPD-stable resume home bronchodilators. 9. Stage IV CKD-monitor creatinine daily with hydration. 10. DNR on admission-palliative medicine consult as patient needs to be at the end of life and progressive debility.  She had elected home hospice services.   11. Physical debility-PT recommending SNF but patient declining to go but agreed to home hospice.    DVT prophylaxis: SCDs Code Status: DNR  Family Communication:   Disposition Plan: admit for IV fluids and GI consult Admission status:  INP  Consultants:  GI  palliative  Procedures:    Antimicrobials:     Subjective: Pt reports persistent abdominal bloating and discomfort.  No BM but having flatus.    Objective: Vitals:   03/24/19 2009 03/25/19 0433 03/25/19 0820 03/25/19 1306  BP: (!) 123/57 127/66  (!) 126/54  Pulse: 91 89  83  Resp: 18 18  16   Temp: 98.4 F (36.9 C) 98.5 F (36.9 C)  97.7 F (36.5 C)  TempSrc: Oral Oral  Oral  SpO2: 93%  94% 96%  Weight:      Height:        Intake/Output Summary (Last 24 hours) at 03/25/2019 1549 Last data filed at 03/25/2019 1429 Gross per 24 hour  Intake 840 ml  Output 1000 ml  Net -160 ml   Filed Weights   03/21/19 0747 03/21/19 1936  Weight: 55.2 kg 53.1 kg   REVIEW OF SYSTEMS  As per history otherwise all reviewed and reported negative  Exam:  General exam: frail elderly female, awake, alert, NAD.  Respiratory system:  No increased work of breathing. Cardiovascular system: normal S1 & S2 heard. No JVD, murmurs, gallops, clicks or pedal edema. Gastrointestinal system: Abdomen with mild distension, soft and RLQ and epigastric tenderness. Active bowel sounds heard. Central nervous system: Alert and oriented. No focal neurological deficits. Extremities: no CCE.   Data Reviewed: Basic Metabolic Panel: Recent Labs  Lab 03/19/19 0555 03/21/19 1050 03/22/19 0544  03/23/19 0751 03/24/19 0813 03/25/19 0631  NA 144 142 142 139  --  139  K 3.8 3.8 3.6 3.5  --  3.5  CL 102 104 108 108  --  107  CO2 32 26 25 25   --  24  GLUCOSE 122* 93 71 82  --  104*  BUN 25* 41* 35* 24*  --  27*  CREATININE 1.66* 2.01* 1.81* 1.59*  --  1.83*  CALCIUM 7.1* 7.5* 7.4* 7.4*  --  7.8*  MG 2.0  --  1.9 1.6* 2.0  --    Liver Function Tests: Recent Labs  Lab 03/21/19 1050 03/22/19 0544  AST 25 26  ALT 23 23  ALKPHOS 58 60  BILITOT 0.7 0.6  PROT 4.5* 4.5*  ALBUMIN 2.3* 2.3*   No results for input(s): LIPASE, AMYLASE in the last 168 hours. No results for input(s): AMMONIA in the last 168 hours. CBC: Recent Labs  Lab 03/21/19 1050 03/22/19 0544 03/23/19 0751 03/24/19 1639 03/25/19 0631  WBC 7.8 7.1 5.4 6.6 7.1  NEUTROABS 6.3 5.6 3.8 4.9 5.5  HGB 7.7* 8.2* 8.2* 8.4* 8.5*  HCT 25.1* 28.7* 28.3* 28.2* 28.7*  MCV 98.4 103.2* 101.4* 100.0 100.0  PLT 268 250 247 223 225   Cardiac Enzymes: No results for input(s): CKTOTAL, CKMB, CKMBINDEX, TROPONINI in the last 168 hours. CBG (last 3)  No results for input(s): GLUCAP in the last 72 hours. Recent Results (from the past 240 hour(s))  SARS CORONAVIRUS 2 (TAT 6-24 HRS) Nasopharyngeal Nasopharyngeal Swab     Status: None   Collection Time: 03/15/19  8:57 PM   Specimen: Nasopharyngeal Swab  Result Value Ref Range Status   SARS Coronavirus 2 NEGATIVE NEGATIVE Final    Comment: (NOTE) SARS-CoV-2 target nucleic acids are NOT DETECTED. The SARS-CoV-2 RNA is generally detectable in upper and lower respiratory specimens during the acute phase of infection. Negative results do not preclude SARS-CoV-2 infection, do not rule out co-infections with other pathogens, and should not be used as the sole basis for treatment or other patient management decisions. Negative results must be combined with clinical observations, patient history, and epidemiological information. The expected result is Negative. Fact Sheet for  Patients: SugarRoll.be Fact Sheet for Healthcare Providers: https://www.woods-mathews.com/ This test is not yet approved or cleared by the [place] FDA and  has been authorized for detection and/or diagnosis of SARS-CoV-2 by FDA under an Emergency Use Authorization (EUA). This EUA will remain  in effect (meaning this test can be used) for the duration of the COVID-19 declaration under Section 56 4(b)(1) of the Act, 21 U.S.C. section 360bbb-3(b)(1), unless the authorization is terminated or revoked sooner. Performed at [facility], [place] [address], [place], [place] [address]   SARS CORONAVIRUS 2 (TAT 6-24 HRS) Nasopharyngeal Nasopharyngeal Swab     Status: None   Collection Time: 03/21/19  1:17 PM   Specimen: Nasopharyngeal Swab  Result Value Ref Range Status   SARS Coronavirus 2 NEGATIVE NEGATIVE Final    Comment: (NOTE) SARS-CoV-2 target nucleic acids are NOT DETECTED. The SARS-CoV-2 RNA is generally detectable  in upper and lower respiratory specimens during the acute phase of infection. Negative results do not preclude SARS-CoV-2 infection, do not rule out co-infections with other pathogens, and should not be used as the sole basis for treatment or other patient management decisions. Negative results must be combined with clinical observations, patient history, and epidemiological information. The expected result is Negative. Fact Sheet for Patients: SugarRoll.be Fact Sheet for Healthcare Providers: https://www.woods-mathews.com/ This test is not yet approved or cleared by the [place] FDA and  has been authorized for detection and/or diagnosis of SARS-CoV-2 by FDA under an Emergency Use Authorization (EUA). This EUA will remain  in effect (meaning this test can be used) for the duration of the COVID-19 declaration under Section 56 4(b)(1) of the Act, 21 U.S.C. section  360bbb-3(b)(1), unless the authorization is terminated or revoked sooner. Performed at [facility], [place] [address], [place], [place] [address]      Studies: DG Abd 2 Views  Result Date: 03/25/2019 CLINICAL DATA:  Small bowel obstruction. EXAM: ABDOMEN - 2 VIEW COMPARISON:  03/24/2019 FINDINGS: There is moderate dilatation of multiple small bowel loops without significant interval change. No intraperitoneal free air is identified. There is mild lumbar scoliosis. IMPRESSION: Unchanged small bowel dilatation consistent with obstruction. Electronically Signed   By: Logan Bores M.D.   On: 03/25/2019 07:54   DG Abd Portable 1V  Result Date: 03/24/2019 CLINICAL DATA:  History of colonic resection September 2020. History of small-bowel obstructions. History of breast cancer. EXAM: PORTABLE ABDOMEN - 1 VIEW COMPARISON:  March 21, 2019 FINDINGS: Dilated loops of small bowel are identified consistent with small-bowel obstruction. Evaluation for free air is limited due to the lack of upright imaging or decubitus imaging. IMPRESSION: Small bowel obstruction. Evaluation for free air is limited as above. Electronically Signed   By: Dorise Bullion III M.D   On: 03/24/2019 13:06   Scheduled Meds: . amLODipine  5 mg Oral Daily  . anastrozole  1 mg Oral QPM  . feeding supplement (ENSURE ENLIVE)  237 mL Oral BID BM  . metroNIDAZOLE  250 mg Oral TID PC  . multivitamin with minerals  1 tablet Oral Daily  . pantoprazole (PROTONIX) IV  40 mg Intravenous Q24H  . polyethylene glycol  17 g Oral BID  . QUEtiapine  25 mg Oral QHS   Continuous Infusions: . 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  1. Check the  care team in [facility] and look for a) attending/consulting [place] provider listed and b) the [facility] team listed 2. Log into [email or web address] and use [place]'s universal password to access. If you do not have the password, please contact the hospital operator. 3. Locate the [facility] provider you are looking for under [facility] and page to a number that you can be directly reached. 4. If you still have difficulty reaching the provider, please page the [facility] (Director on Call) for the Hospitalists listed on amion for assistance.

## 2019-03-26 ENCOUNTER — Inpatient Hospital Stay (HOSPITAL_COMMUNITY): Payer: PPO

## 2019-03-26 DIAGNOSIS — K56609 Unspecified intestinal obstruction, unspecified as to partial versus complete obstruction: Secondary | ICD-10-CM

## 2019-03-26 LAB — BASIC METABOLIC PANEL
Anion gap: 8 (ref 5–15)
BUN: 27 mg/dL — ABNORMAL HIGH (ref 8–23)
CO2: 24 mmol/L (ref 22–32)
Calcium: 7.9 mg/dL — ABNORMAL LOW (ref 8.9–10.3)
Chloride: 107 mmol/L (ref 98–111)
Creatinine, Ser: 1.69 mg/dL — ABNORMAL HIGH (ref 0.44–1.00)
GFR calc Af Amer: 31 mL/min — ABNORMAL LOW (ref >60)
GFR calc non Af Amer: 27 mL/min — ABNORMAL LOW (ref >60)
Glucose, Bld: 99 mg/dL (ref 70–99)
Potassium: 3.9 mmol/L (ref 3.5–5.1)
Sodium: 139 mmol/L (ref 135–145)

## 2019-03-26 LAB — CBC WITH DIFFERENTIAL/PLATELET
Abs Immature Granulocytes: 0.04 10*3/uL (ref 0.00–0.07)
Basophils Absolute: 0 10*3/uL (ref 0.0–0.1)
Basophils Relative: 0 %
Eosinophils Absolute: 0.1 10*3/uL (ref 0.0–0.5)
Eosinophils Relative: 1 %
HCT: 29 % — ABNORMAL LOW (ref 36.0–46.0)
Hemoglobin: 8.5 g/dL — ABNORMAL LOW (ref 12.0–15.0)
Immature Granulocytes: 1 %
Lymphocytes Relative: 19 %
Lymphs Abs: 1.3 10*3/uL (ref 0.7–4.0)
MCH: 29.2 pg (ref 26.0–34.0)
MCHC: 29.3 g/dL — ABNORMAL LOW (ref 30.0–36.0)
MCV: 99.7 fL (ref 80.0–100.0)
Monocytes Absolute: 0.5 10*3/uL (ref 0.1–1.0)
Monocytes Relative: 7 %
Neutro Abs: 4.9 10*3/uL (ref 1.7–7.7)
Neutrophils Relative %: 72 %
Platelets: 191 10*3/uL (ref 150–400)
RBC: 2.91 MIL/uL — ABNORMAL LOW (ref 3.87–5.11)
RDW: 15.3 % (ref 11.5–15.5)
WBC: 6.8 10*3/uL (ref 4.0–10.5)
nRBC: 0 % (ref 0.0–0.2)

## 2019-03-26 LAB — GI PATHOGEN PANEL BY PCR, STOOL

## 2019-03-26 LAB — SURGICAL PCR SCREEN
MRSA, PCR: NEGATIVE
Staphylococcus aureus: NEGATIVE

## 2019-03-26 MED ORDER — CIPROFLOXACIN IN D5W 400 MG/200ML IV SOLN
400.0000 mg | INTRAVENOUS | Status: AC
  Administered 2019-03-27: 400 mg via INTRAVENOUS
  Filled 2019-03-26: qty 200

## 2019-03-26 MED ORDER — CHLORHEXIDINE GLUCONATE CLOTH 2 % EX PADS
6.0000 | MEDICATED_PAD | Freq: Once | CUTANEOUS | Status: AC
  Administered 2019-03-27: 6 via TOPICAL

## 2019-03-26 NOTE — Care Management Important Message (Signed)
Important Message  Patient Details  Name: [name] MRN: [id number] Date of Birth: [birth year]   Medicare Important Message Given:  Yes(Patricia, RN will deliver letter to patient due to contact precautions)     Tommy Medal 03/26/2019, 1:28 PM

## 2019-03-26 NOTE — Progress Notes (Signed)
  Subjective: No significant change since yesterday.  No significant bowel movement.  Intermittent nausea.  Objective: Vital signs in last 24 hours: Temp:  [97.7 F (36.5 C)-98.5 F (36.9 C)] 98.5 F (36.9 C) (01/11 0916) Pulse Rate:  [73-83] 83 (01/11 0916) Resp:  [16-19] 19 (01/11 0916) BP: (115-126)/(54-79) 115/79 (01/11 0916) SpO2:  [93 %-97 %] 93 % (01/11 0916) Last BM Date: 03/23/19  Intake/Output from previous day: 01/10 0701 - 01/11 0700 In: 960 [P.O.:960] Out: 1100 [Urine:1100] Intake/Output this shift: No intake/output data recorded.  General appearance: alert, cooperative and no distress GI: Soft, distended.  Bowel sounds are present.  Lab Results:  Recent Labs    03/25/19 0631 03/26/19 0549  WBC 7.1 6.8  HGB 8.5* 8.5*  HCT 28.7* 29.0*  PLT 225 191   BMET Recent Labs    03/25/19 0631 03/26/19 0549  NA 139 139  K 3.5 3.9  CL 107 107  CO2 24 24  GLUCOSE 104* 99  BUN 27* 27*  CREATININE 1.83* 1.69*  CALCIUM 7.8* 7.9*   PT/INR No results for input(s): LABPROT, INR in the last 72 hours.  Studies/Results: DG Abd 1 View  Result Date: 03/26/2019 CLINICAL DATA:  Small-bowel obstruction EXAM: ABDOMEN - 1 VIEW COMPARISON:  March 25, 2019 FINDINGS: There remains mild air-filled dilated loops of small bowel seen within the mid abdomen measuring up to 4.4 cm. There is air seen to the level of the rectum. No acute osseous abnormality. IMPRESSION: No significant change in findings suggestive of partial small bowel obstruction Electronically Signed   By: Prudencio Pair M.D.   On: 03/26/2019 06:04   DG Abd 2 Views  Result Date: 03/25/2019 CLINICAL DATA:  Small bowel obstruction. EXAM: ABDOMEN - 2 VIEW COMPARISON:  03/24/2019 FINDINGS: There is moderate dilatation of multiple small bowel loops without significant interval change. No intraperitoneal free air is identified. There is mild lumbar scoliosis. IMPRESSION: Unchanged small bowel dilatation consistent with  obstruction. Electronically Signed   By: Logan Bores M.D.   On: 03/25/2019 07:54   DG Abd Portable 1V  Result Date: 03/24/2019 CLINICAL DATA:  History of colonic resection September 2020. History of small-bowel obstructions. History of breast cancer. EXAM: PORTABLE ABDOMEN - 1 VIEW COMPARISON:  March 21, 2019 FINDINGS: Dilated loops of small bowel are identified consistent with small-bowel obstruction. Evaluation for free air is limited due to the lack of upright imaging or decubitus imaging. IMPRESSION: Small bowel obstruction. Evaluation for free air is limited as above. Electronically Signed   By: Dorise Bullion III M.D   On: 03/24/2019 13:06    Anti-infectives: Anti-infectives (From admission, onward)   Start     Dose/Rate Route Frequency Ordered Stop   03/23/19 1345  metroNIDAZOLE (FLAGYL) tablet 250 mg     250 mg Oral 3 times daily after meals 03/23/19 1343        Assessment/Plan: Impression: Partial small bowel obstruction, not resolving.  Failing conservative management. I have extensive discussion with the patient that she has failed conservative management.  She now realizes this and is amenable to surgical exploration at [facility].  We will proceed with exploratory laparotomy with lysis of adhesions tomorrow.  The risks and benefits of the procedure including bleeding, infection, cardiopulmonary difficulties, heart attack, and pneumonia were fully explained to the patient, who gave informed consent.  LOS: 4 days    Aviva Signs 03/26/2019

## 2019-03-26 NOTE — H&P (View-Only) (Signed)
  Subjective: No significant change since yesterday.  No significant bowel movement.  Intermittent nausea.  Objective: Vital signs in last 24 hours: Temp:  [97.7 F (36.5 C)-98.5 F (36.9 C)] 98.5 F (36.9 C) (01/11 0916) Pulse Rate:  [73-83] 83 (01/11 0916) Resp:  [16-19] 19 (01/11 0916) BP: (115-126)/(54-79) 115/79 (01/11 0916) SpO2:  [93 %-97 %] 93 % (01/11 0916) Last BM Date: 03/23/19  Intake/Output from previous day: 01/10 0701 - 01/11 0700 In: 960 [P.O.:960] Out: 1100 [Urine:1100] Intake/Output this shift: No intake/output data recorded.  General appearance: alert, cooperative and no distress GI: Soft, distended.  Bowel sounds are present.  Lab Results:  Recent Labs    03/25/19 0631 03/26/19 0549  WBC 7.1 6.8  HGB 8.5* 8.5*  HCT 28.7* 29.0*  PLT 225 191   BMET Recent Labs    03/25/19 0631 03/26/19 0549  NA 139 139  K 3.5 3.9  CL 107 107  CO2 24 24  GLUCOSE 104* 99  BUN 27* 27*  CREATININE 1.83* 1.69*  CALCIUM 7.8* 7.9*   PT/INR No results for input(s): LABPROT, INR in the last 72 hours.  Studies/Results: DG Abd 1 View  Result Date: 03/26/2019 CLINICAL DATA:  Small-bowel obstruction EXAM: ABDOMEN - 1 VIEW COMPARISON:  March 25, 2019 FINDINGS: There remains mild air-filled dilated loops of small bowel seen within the mid abdomen measuring up to 4.4 cm. There is air seen to the level of the rectum. No acute osseous abnormality. IMPRESSION: No significant change in findings suggestive of partial small bowel obstruction Electronically Signed   By: Prudencio Pair M.D.   On: 03/26/2019 06:04   DG Abd 2 Views  Result Date: 03/25/2019 CLINICAL DATA:  Small bowel obstruction. EXAM: ABDOMEN - 2 VIEW COMPARISON:  03/24/2019 FINDINGS: There is moderate dilatation of multiple small bowel loops without significant interval change. No intraperitoneal free air is identified. There is mild lumbar scoliosis. IMPRESSION: Unchanged small bowel dilatation consistent with  obstruction. Electronically Signed   By: Logan Bores M.D.   On: 03/25/2019 07:54   DG Abd Portable 1V  Result Date: 03/24/2019 CLINICAL DATA:  History of colonic resection September 2020. History of small-bowel obstructions. History of breast cancer. EXAM: PORTABLE ABDOMEN - 1 VIEW COMPARISON:  March 21, 2019 FINDINGS: Dilated loops of small bowel are identified consistent with small-bowel obstruction. Evaluation for free air is limited due to the lack of upright imaging or decubitus imaging. IMPRESSION: Small bowel obstruction. Evaluation for free air is limited as above. Electronically Signed   By: Dorise Bullion III M.D   On: 03/24/2019 13:06    Anti-infectives: Anti-infectives (From admission, onward)   Start     Dose/Rate Route Frequency Ordered Stop   03/23/19 1345  metroNIDAZOLE (FLAGYL) tablet 250 mg     250 mg Oral 3 times daily after meals 03/23/19 1343        Assessment/Plan: Impression: Partial small bowel obstruction, not resolving.  Failing conservative management. I have extensive discussion with the patient that she has failed conservative management.  She now realizes this and is amenable to surgical exploration at [facility].  We will proceed with exploratory laparotomy with lysis of adhesions tomorrow.  The risks and benefits of the procedure including bleeding, infection, cardiopulmonary difficulties, heart attack, and pneumonia were fully explained to the patient, who gave informed consent.  LOS: 4 days    Aviva Signs 03/26/2019

## 2019-03-26 NOTE — Progress Notes (Signed)
PROGRESS NOTE [place] [facility]   [name]  DSK:[id number]  DOB: 04-10-1928  DOA: 03/21/2019 PCP: Kathyrn Drown, MD   Brief Admission Hx: 86 y.o. 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  She reports that she has been having difficulty eating and drinking and having nausea vomiting and diarrhea.   MDM/Assessment & Plan:    1. Recurrent partial SBO - pt started having recurrent symptoms after lunch 1/9, xray confirms recurrence of partial SBO.  Patient was seen by general surgeon Dr. Arnoldo Morale who recommending operative management.  Patient is agreeable.  Planning to OR 03/27/19.  2. Mild dehydration-Improved with IV fluids.   Patient has had poor oral intake since being at home.  She presented with diarrhea and nausea and vomiting which has resolved.  She has a mild AKI.  Treating with IV fluid hydration.  Monitor electrolytes closely. 3. AKI - improved with IV fluids.  4. Nausea vomiting diarrhea-resolved now.   No recent antibiotic use.  GI panel pending. 5. Abdominal pain - secondary to recurrent partial SBO.  IV pain meds as needed. 6. Guaiac positive stools-She is heme positive.  GI is following. Hg stable.  With her history of colon cancer and it appears that the cancer was removed with clear margins in September 2020.  recheck CBC in AM.  7. Iron deficiency anemia due to chronic blood loss-type and screen and transfuse if  needed. 8. COPD-stable resume home bronchodilators. 9. Stage IV CKD-monitor creatinine daily with hydration. 10. DNR on admission-palliative medicine consult as patient needs to be at the end of life and progressive debility.  She had elected home hospice services.   11. Physical debility-PT recommending SNF but patient declining to go but agreed to home hospice.    DVT prophylaxis: SCDs Code Status: DNR  Family Communication:   Disposition Plan: admit for IV fluids and GI consult Admission status:  INP  Consultants:  GI  palliative  Procedures:    Antimicrobials:     Subjective: Pt remains very uncomfortable with abdominal bloating and pain and no BM.  Pt not able to eat.   Objective: Vitals:   03/26/19 0511 03/26/19 0843 03/26/19 0916 03/26/19 1339  BP: (!) 124/56  115/79 130/76  Pulse: 73  83 76  Resp: 16  19 19   Temp: 98.4 F (36.9 C)  98.5 F (36.9 C) 98.3 F (36.8 C)  TempSrc: Oral  Oral Oral  SpO2: 96% 97% 93% 96%  Weight:      Height:        Intake/Output Summary (Last 24 hours) at 03/26/2019 1540 Last data filed at 03/26/2019 1250 Gross per 24 hour  Intake 840 ml  Output 1100 ml  Net -260 ml   Filed Weights   03/21/19 0747 03/21/19 1936  Weight: 55.2 kg 53.1 kg   REVIEW OF SYSTEMS  As per history otherwise all reviewed and reported negative  Exam:  General exam: frail elderly female, awake, alert, NAD.  Respiratory system:  No increased work of breathing. Cardiovascular system: normal S1 & S2  heard. No JVD, murmurs, gallops, clicks or pedal edema. Gastrointestinal system: Abdomen more distended and tender with palpation soft and RLQ and epigastric tenderness. Active bowel sounds heard. Central nervous system: Alert and oriented. No focal neurological deficits. Extremities: no CCE.   Data Reviewed: Basic Metabolic Panel: Recent Labs  Lab 03/21/19 1050 03/22/19 0544 03/23/19 0751 03/24/19 0813 03/25/19 0631 03/26/19 0549  NA 142 142  139  --  139 139  K 3.8 3.6 3.5  --  3.5 3.9  CL 104 108 108  --  107 107  CO2 26 25 25   --  24 24  GLUCOSE 93 71 82  --  104* 99  BUN 41* 35* 24*  --  27* 27*  CREATININE 2.01* 1.81* 1.59*  --  1.83* 1.69*  CALCIUM 7.5* 7.4* 7.4*  --  7.8* 7.9*  MG  --  1.9 1.6* 2.0  --   --    Liver Function Tests: Recent Labs  Lab 03/21/19 1050 03/22/19 0544  AST 25 26  ALT 23 23  ALKPHOS 58 60  BILITOT 0.7 0.6  PROT 4.5* 4.5*  ALBUMIN 2.3* 2.3*   No results for input(s): LIPASE, AMYLASE in the last 168 hours. No results for input(s): AMMONIA in the last 168 hours. CBC: Recent Labs  Lab 03/22/19 0544 03/23/19 0751 03/24/19 1639 03/25/19 0631 03/26/19 0549  WBC 7.1 5.4 6.6 7.1 6.8  NEUTROABS 5.6 3.8 4.9 5.5 4.9  HGB 8.2* 8.2* 8.4* 8.5* 8.5*  HCT 28.7* 28.3* 28.2* 28.7* 29.0*  MCV 103.2* 101.4* 100.0 100.0 99.7  PLT 250 247 223 225 191   Cardiac Enzymes: No results for input(s): CKTOTAL, CKMB, CKMBINDEX, TROPONINI in the last 168 hours. CBG (last 3)  No results for input(s): GLUCAP in the last 72 hours. Recent Results (from the past 240 hour(s))  SARS CORONAVIRUS 2 (TAT 6-24 HRS) Nasopharyngeal Nasopharyngeal Swab     Status: None   Collection Time: 03/21/19  1:17 PM   Specimen: Nasopharyngeal Swab  Result Value Ref Range Status   SARS Coronavirus 2 NEGATIVE NEGATIVE Final    Comment: (NOTE) SARS-CoV-2 target nucleic acids are NOT DETECTED. The SARS-CoV-2 RNA is generally detectable in upper and lower respiratory specimens during the acute phase of infection. Negative results do not preclude SARS-CoV-2 infection, do not rule out co-infections with other pathogens, and should not be used as the sole basis for treatment or other patient management decisions. Negative results must be combined with clinical observations, patient history, and epidemiological information. The expected result is Negative. Fact Sheet for Patients: SugarRoll.be Fact  Sheet for Healthcare Providers: https://www.woods-mathews.com/ This test is not yet approved or cleared by the [place] FDA and  has been authorized for detection and/or diagnosis of SARS-CoV-2 by FDA under an Emergency Use Authorization (EUA). This EUA will remain  in effect (meaning this test can be used) for the duration of the COVID-19 declaration under Section 56 4(b)(1) of the Act, 21 U.S.C. section 360bbb-3(b)(1), unless the authorization is terminated or revoked sooner. Performed at [facility], [place] [address] [place], [place] [address]   GI pathogen panel by PCR, stool     Status: None   Collection Time: 03/23/19  1:56 PM   Specimen: Stool  Result Value Ref Range Status   Plesiomonas shigelloides NOT DETECTED NOT DETECTED Final   Yersinia enterocolitica NOT DETECTED NOT DETECTED Final   Vibrio NOT DETECTED NOT DETECTED Final   Enteropathogenic E coli NOT DETECTED NOT DETECTED Final  E coli (ETEC) LT/ST NOT DETECTED NOT DETECTED Final   E coli 2876 by PCR Not applicable NOT DETECTED Final   Cryptosporidium by PCR NOT DETECTED NOT DETECTED Final   Entamoeba histolytica NOT DETECTED NOT DETECTED Final   Adenovirus F 40/41 NOT DETECTED NOT DETECTED Final   Norovirus GI/GII NOT DETECTED NOT DETECTED Final   Sapovirus NOT DETECTED NOT DETECTED Final    Comment: (NOTE) Performed At: [facility] [place], [place] [id number] Rush Farmer MD BT:[id number]    Vibrio cholerae NOT DETECTED NOT DETECTED Final   Campylobacter by PCR NOT DETECTED NOT DETECTED Final   Salmonella by PCR NOT DETECTED NOT DETECTED Final   E coli (STEC) NOT DETECTED NOT DETECTED Final   Enteroaggregative E coli NOT DETECTED NOT DETECTED Final   Shigella by PCR NOT DETECTED NOT DETECTED Final   Cyclospora cayetanensis NOT DETECTED NOT DETECTED Final   Astrovirus NOT DETECTED NOT DETECTED Final   G lamblia by PCR NOT DETECTED NOT DETECTED Final   Rotavirus  A by PCR NOT DETECTED NOT DETECTED Final     Studies: DG Abd 1 View  Result Date: 03/26/2019 CLINICAL DATA:  Small-bowel obstruction EXAM: ABDOMEN - 1 VIEW COMPARISON:  March 25, 2019 FINDINGS: There remains mild air-filled dilated loops of small bowel seen within the mid abdomen measuring up to 4.4 cm. There is air seen to the level of the rectum. No acute osseous abnormality. IMPRESSION: No significant change in findings suggestive of partial small bowel obstruction Electronically Signed   By: Prudencio Pair M.D.   On: 03/26/2019 06:04   DG Abd 2 Views  Result Date: 03/25/2019 CLINICAL DATA:  Small bowel obstruction. EXAM: ABDOMEN - 2 VIEW COMPARISON:  03/24/2019 FINDINGS: There is moderate dilatation of multiple small bowel loops without significant interval change. No intraperitoneal free air is identified. There is mild lumbar scoliosis. IMPRESSION: Unchanged small bowel dilatation consistent with obstruction. Electronically Signed   By: Logan Bores M.D.   On: 03/25/2019 07:54   Scheduled Meds: . amLODipine  5 mg Oral Daily  . anastrozole  1 mg Oral QPM  . feeding supplement (ENSURE ENLIVE)  237 mL Oral BID BM  . metroNIDAZOLE  250 mg Oral TID PC  . multivitamin with minerals  1 tablet Oral Daily  . pantoprazole (PROTONIX) IV  40 mg Intravenous Q24H  . polyethylene glycol  17 g Oral BID  . QUEtiapine  25 mg Oral QHS   Continuous Infusions: . 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)   S/P colon resection   Lower GI bleed from cecal cancer   DNR (do not resuscitate)    Goals of care, counseling/discussion   Palliative care by specialist   Encounter for hospice care discussion   Anemia   Gastrointestinal hemorrhage   Nausea vomiting and diarrhea   Small bowel obstruction ([place])   Recurrent partial small bowel obstruction  Time spent:   Irwin Brakeman, MD [facility] 03/26/2019, 3:40 PM    LOS: 4 days  How to contact the [facility] Attending  or Consulting provider [place] or covering provider during after hours [place], for this patient?  1. Check the care team in [facility] and look for a) attending/consulting [facility] provider listed and b) the [facility] team listed 2. Log into [email or web address] and use [place] universal password to access. If you do not have the password, please contact the hospital operator. 3. Locate the [facility] provider you are looking for under [facility] and page to a number that you can be directly reached. 4. If you still have difficulty reaching the provider, please page the [facility] (Director on Call) for the Hospitalists listed on amion for assistance.

## 2019-03-26 NOTE — Progress Notes (Signed)
Subjective:  Patient feels better this morning.  She said she had sharp pain in her lower abdomen during the night and she asked for pain medication.  She was given fentanyl.  Now she complains of mild pain in right lower quadrant.  She is trying to eat her breakfast.  She says she took 2 tablespoonful and she feels full and bloated.  Current Medications: Current Facility-Administered Medications  Medication Dose Route Frequency Provider Last Rate Last Admin  . 0.9 %  sodium chloride infusion   Intravenous Continuous Johnson, Clanford L, MD 50 mL/hr at 03/26/19 0300 New Bag at 03/26/19 0300  . acetaminophen (TYLENOL) tablet 650 mg  650 mg Oral Q6H PRN Johnson, Clanford L, MD       Or  . acetaminophen (TYLENOL) suppository 650 mg  650 mg Rectal Q6H PRN Johnson, Clanford L, MD      . albuterol (PROVENTIL) (2.5 MG/3ML) 0.083% nebulizer solution 2.5 mg  2.5 mg Nebulization Q4H PRN Johnson, Clanford L, MD      . amLODipine (NORVASC) tablet 5 mg  5 mg Oral Daily Johnson, Clanford L, MD   5 mg at 03/25/19 0928  . anastrozole (ARIMIDEX) tablet 1 mg  1 mg Oral QPM Johnson, Clanford L, MD   1 mg at 03/25/19 1703  . feeding supplement (ENSURE ENLIVE) (ENSURE ENLIVE) liquid 237 mL  237 mL Oral BID BM Aviva Signs, MD   237 mL at 03/25/19 1031  . fentaNYL (SUBLIMAZE) injection 12.5 mcg  12.5 mcg Intravenous Q2H PRN Wynetta Emery, Clanford L, MD   12.5 mcg at 03/26/19 0108  . hydrocortisone (ANUSOL-HC) 2.5 % rectal cream 1 application  1 application Rectal BID PRN Johnson, Clanford L, MD      . metroNIDAZOLE (FLAGYL) tablet 250 mg  250 mg Oral TID PC Lezli Danek U, MD   250 mg at 03/25/19 1703  . multivitamin with minerals tablet 1 tablet  1 tablet Oral Daily Wynetta Emery, Clanford L, MD   1 tablet at 03/25/19 0928  . ondansetron (ZOFRAN) tablet 4 mg  4 mg Oral Q6H PRN Johnson, Clanford L, MD       Or  . ondansetron (ZOFRAN) injection 4 mg  4 mg Intravenous Q6H PRN Johnson, Clanford L, MD   4 mg at 03/24/19 1658   . pantoprazole (PROTONIX) injection 40 mg  40 mg Intravenous Q24H Johnson, Clanford L, MD   40 mg at 03/25/19 0928  . polyethylene glycol (MIRALAX / GLYCOLAX) packet 17 g  17 g Oral BID Aviva Signs, MD   17 g at 03/25/19 2012  . QUEtiapine (SEROQUEL) tablet 25 mg  25 mg Oral QHS Johnson, Clanford L, MD   25 mg at 03/25/19 2012     Objective: Blood pressure (!) 124/56, pulse 73, temperature 98.4 F (36.9 C), temperature source Oral, resp. rate 16, height '5\' 4"'$  (1.626 m), weight 53.1 kg, SpO2 97 %. Patient is alert and in no acute distress. Conjunctiva is pink. Sclera is nonicteric Oropharyngeal mucosa is normal. No neck masses or thyromegaly noted. Cardiac exam with regular rhythm normal S1 and S2.  Grade 2/6 systolic murmur at aortic area. Lungs are clear to auscultation. Abdomen she has midline scar.  Abdomen is mildly distended.  Bowel sounds are present.  On palpation she has mild tenderness of right lower quadrant.  No organomegaly or masses. No LE edema or clubbing noted.  Labs/studies Results:  CBC Latest Ref Rng & Units 03/26/2019 03/25/2019 03/24/2019  WBC 4.0 - 10.5 K/uL  6.8 7.1 6.6  Hemoglobin 12.0 - 15.0 g/dL 8.5(L) 8.5(L) 8.4(L)  Hematocrit 36.0 - 46.0 % 29.0(L) 28.7(L) 28.2(L)  Platelets 150 - 400 K/uL 191 225 223    CMP Latest Ref Rng & Units 03/26/2019 03/25/2019 03/23/2019  Glucose 70 - 99 mg/dL 99 104(H) 82  BUN 8 - 23 mg/dL 27(H) 27(H) 24(H)  Creatinine 0.44 - 1.00 mg/dL 1.69(H) 1.83(H) 1.59(H)  Sodium 135 - 145 mmol/L 139 139 139  Potassium 3.5 - 5.1 mmol/L 3.9 3.5 3.5  Chloride 98 - 111 mmol/L 107 107 108  CO2 22 - 32 mmol/L _0 Calcium 8.9 - 10.3 mg/dL 7.9(L) 7.8(L) 7.4(L)  Total Protein 6.5 - 8.1 g/dL - - -  Total Bilirubin 0.3 - 1.2 mg/dL - - -  Alkaline Phos 38 - 126 U/L - - -  AST 15 - 41 U/L - - -  ALT 0 - 44 U/L - - -    Hepatic Function Latest Ref Rng & Units 03/22/2019 03/21/2019 03/18/2019  Total Protein 6.5 - 8.1 g/dL 4.5(L) 4.5(L) 5.3(L)  Albumin  3.5 - 5.0 g/dL 2.3(L) 2.3(L) 2.7(L)  AST 15 - 41 U/L _1 ALT 0 - 44 U/L _2 Alk Phosphatase 38 - 126 U/L 60 58 64  Total Bilirubin 0.3 - 1.2 mg/dL 0.6 0.7 1.1  Bilirubin, Direct 0.0 - 0.2 mg/dL - - -    Abdominal series reviewed from last 3 days.  Single view revealing dilated loops of small bowel with stepladder pattern.  No significant change in the last 24 hours.  Fecal lactoferrin negative. GI pathogen panel pending.  Assessment:  #1.  Recurrent small bowel obstruction.  She has partial obstruction.  This is the third episode in the last 3 weeks.  She has undergone 2 abdominal pelvic CTs which did not reveal any abnormality to suggest she will.  Suspect she has adhesive disease.  Appreciate Dr. Arnoldo Morale seeing the patient over the weekend.  I am afraid patient will need surgical intervention.  Her surgeon Dr. Fanny Skates has retired and she is agreeable to receive the surgery here. Patient is high risk for surgery.   #2.  Anemia.  Stool was guaiac positive on admission.  H&H is coming up.  She was given single dose of vitamin K 1 mg subcu last week.  Patient is on IV pantoprazole.  #3.  Diarrhea.  Intermittent diarrhea may be related to recurrent small bowel obstruction.  #4.  Chronic kidney disease.  Recommendations  Patient will need surgical intervention. Discussed with Dr. Arnoldo Morale and Dr. Wynetta Emery.

## 2019-03-27 ENCOUNTER — Inpatient Hospital Stay (HOSPITAL_COMMUNITY): Payer: PPO | Admitting: Anesthesiology

## 2019-03-27 ENCOUNTER — Encounter (HOSPITAL_COMMUNITY)
Admission: EM | Disposition: A | Discharge status: Skilled Nursing Facility | Payer: Self-pay | Source: Home / Self Care | Attending: General Surgery

## 2019-03-27 ENCOUNTER — Encounter (HOSPITAL_COMMUNITY): Payer: Self-pay | Admitting: Family Medicine

## 2019-03-27 ENCOUNTER — Ambulatory Visit: Payer: Medicare Other | Admitting: Internal Medicine

## 2019-03-27 HISTORY — PX: BOWEL RESECTION: SHX1257

## 2019-03-27 HISTORY — PX: LIVER BIOPSY: SHX301

## 2019-03-27 LAB — GLUCOSE, CAPILLARY: Glucose-Capillary: 104 mg/dL — ABNORMAL HIGH (ref 70–99)

## 2019-03-27 SURGERY — EXCISION, SMALL INTESTINE
Anesthesia: General | Site: Abdomen

## 2019-03-27 MED ORDER — ENOXAPARIN SODIUM 30 MG/0.3ML ~~LOC~~ SOLN
30.0000 mg | SUBCUTANEOUS | Status: DC
  Administered 2019-03-28: 30 mg via SUBCUTANEOUS
  Filled 2019-03-27 (×2): qty 0.3

## 2019-03-27 MED ORDER — POVIDONE-IODINE 10 % OINT PACKET
TOPICAL_OINTMENT | CUTANEOUS | Status: DC | PRN
  Administered 2019-03-27: 1 via TOPICAL

## 2019-03-27 MED ORDER — ONDANSETRON HCL 4 MG/2ML IJ SOLN
INTRAMUSCULAR | Status: DC | PRN
  Administered 2019-03-27: 4 mg via INTRAVENOUS

## 2019-03-27 MED ORDER — LORAZEPAM 2 MG/ML IJ SOLN
0.5000 mg | INTRAMUSCULAR | Status: DC | PRN
  Administered 2019-03-29: 0.5 mg via INTRAVENOUS
  Filled 2019-03-27: qty 1

## 2019-03-27 MED ORDER — LIDOCAINE HCL (CARDIAC) PF 50 MG/5ML IV SOSY
PREFILLED_SYRINGE | INTRAVENOUS | Status: DC | PRN
  Administered 2019-03-27: 40 mg via INTRAVENOUS

## 2019-03-27 MED ORDER — PROPOFOL 10 MG/ML IV BOLUS
INTRAVENOUS | Status: DC | PRN
  Administered 2019-03-27: 90 mg via INTRAVENOUS

## 2019-03-27 MED ORDER — SUCCINYLCHOLINE 20MG/ML (10ML) SYRINGE FOR MEDFUSION PUMP - OPTIME
INTRAMUSCULAR | Status: DC | PRN
  Administered 2019-03-27: 80 mg via INTRAVENOUS

## 2019-03-27 MED ORDER — ONDANSETRON HCL 4 MG/2ML IJ SOLN
4.0000 mg | Freq: Four times a day (QID) | INTRAMUSCULAR | Status: DC | PRN
  Administered 2019-03-27: 4 mg via INTRAVENOUS

## 2019-03-27 MED ORDER — ALBUTEROL SULFATE (2.5 MG/3ML) 0.083% IN NEBU: 2.5000 mg | INHALATION_SOLUTION | RESPIRATORY_TRACT | Status: DC | PRN

## 2019-03-27 MED ORDER — FENTANYL CITRATE (PF) 100 MCG/2ML IJ SOLN
INTRAMUSCULAR | Status: DC | PRN
  Administered 2019-03-27: 50 ug via INTRAVENOUS
  Administered 2019-03-27 (×4): 25 ug via INTRAVENOUS

## 2019-03-27 MED ORDER — KETOROLAC TROMETHAMINE 15 MG/ML IJ SOLN
INTRAMUSCULAR | Status: AC
  Filled 2019-03-27: qty 1

## 2019-03-27 MED ORDER — POVIDONE-IODINE 10 % EX OINT
TOPICAL_OINTMENT | CUTANEOUS | Status: AC
  Filled 2019-03-27: qty 1

## 2019-03-27 MED ORDER — SODIUM CHLORIDE 0.9 % IV SOLN: INTRAVENOUS | Status: DC

## 2019-03-27 MED ORDER — KETOROLAC TROMETHAMINE 30 MG/ML IJ SOLN: 15.0000 mg | Freq: Once | INTRAMUSCULAR | Status: DC

## 2019-03-27 MED ORDER — BUPIVACAINE LIPOSOME 1.3 % IJ SUSP
INTRAMUSCULAR | Status: AC
  Filled 2019-03-27: qty 20

## 2019-03-27 MED ORDER — SUGAMMADEX SODIUM 200 MG/2ML IV SOLN
INTRAVENOUS | Status: DC | PRN
  Administered 2019-03-27: 111.6 mg via INTRAVENOUS

## 2019-03-27 MED ORDER — TRAMADOL HCL 50 MG PO TABS
50.0000 mg | ORAL_TABLET | Freq: Four times a day (QID) | ORAL | Status: DC | PRN
  Administered 2019-03-30 – 2019-04-02 (×2): 50 mg via ORAL
  Filled 2019-03-27 (×2): qty 1

## 2019-03-27 MED ORDER — MIDAZOLAM HCL 2 MG/2ML IJ SOLN: 0.5000 mg | Freq: Once | INTRAMUSCULAR | Status: DC | PRN

## 2019-03-27 MED ORDER — LACTATED RINGERS IV SOLN: INTRAVENOUS | Status: DC

## 2019-03-27 MED ORDER — FENTANYL CITRATE (PF) 100 MCG/2ML IJ SOLN
12.5000 ug | INTRAMUSCULAR | Status: DC | PRN
  Administered 2019-03-28 – 2019-03-29 (×2): 12.5 ug via INTRAVENOUS
  Filled 2019-03-27 (×3): qty 2

## 2019-03-27 MED ORDER — SODIUM CHLORIDE 0.9 % IR SOLN
Status: DC | PRN
  Administered 2019-03-27: 1000 mL
  Administered 2019-03-27: 2000 mL

## 2019-03-27 MED ORDER — FENTANYL CITRATE (PF) 250 MCG/5ML IJ SOLN
INTRAMUSCULAR | Status: AC
  Filled 2019-03-27: qty 5

## 2019-03-27 MED ORDER — HEMOSTATIC AGENTS (NO CHARGE) OPTIME
TOPICAL | Status: DC | PRN
  Administered 2019-03-27: 1 via TOPICAL

## 2019-03-27 MED ORDER — PHENYLEPHRINE HCL (PRESSORS) 10 MG/ML IV SOLN
INTRAVENOUS | Status: DC | PRN
  Administered 2019-03-27: 80 ug via INTRAVENOUS

## 2019-03-27 MED ORDER — ONDANSETRON 4 MG PO TBDP: 4.0000 mg | ORAL_TABLET | Freq: Four times a day (QID) | ORAL | Status: DC | PRN

## 2019-03-27 MED ORDER — DIPHENHYDRAMINE HCL 50 MG/ML IJ SOLN: 12.5000 mg | Freq: Four times a day (QID) | INTRAMUSCULAR | Status: DC | PRN

## 2019-03-27 MED ORDER — ROCURONIUM 10MG/ML (10ML) SYRINGE FOR MEDFUSION PUMP - OPTIME
INTRAVENOUS | Status: DC | PRN
  Administered 2019-03-27: 20 mg via INTRAVENOUS

## 2019-03-27 MED ORDER — DEXAMETHASONE SODIUM PHOSPHATE 4 MG/ML IJ SOLN
INTRAMUSCULAR | Status: DC | PRN
  Administered 2019-03-27: 8 mg via INTRAVENOUS

## 2019-03-27 MED ORDER — AMLODIPINE BESYLATE 5 MG PO TABS
5.0000 mg | ORAL_TABLET | Freq: Every day | ORAL | Status: DC
  Administered 2019-03-28 – 2019-04-03 (×7): 5 mg via ORAL
  Filled 2019-03-27 (×7): qty 1

## 2019-03-27 MED ORDER — SIMETHICONE 80 MG PO CHEW: 40.0000 mg | CHEWABLE_TABLET | Freq: Four times a day (QID) | ORAL | Status: DC | PRN

## 2019-03-27 MED ORDER — PANTOPRAZOLE SODIUM 40 MG PO TBEC
40.0000 mg | DELAYED_RELEASE_TABLET | Freq: Every day | ORAL | Status: DC
  Administered 2019-03-28 – 2019-04-03 (×7): 40 mg via ORAL
  Filled 2019-03-27 (×7): qty 1

## 2019-03-27 MED ORDER — PROMETHAZINE HCL 25 MG/ML IJ SOLN: 6.2500 mg | INTRAMUSCULAR | Status: DC | PRN

## 2019-03-27 MED ORDER — ACETAMINOPHEN 325 MG PO TABS: 650.0000 mg | ORAL_TABLET | Freq: Four times a day (QID) | ORAL | Status: DC | PRN

## 2019-03-27 MED ORDER — KETOROLAC TROMETHAMINE 15 MG/ML IJ SOLN
15.0000 mg | Freq: Once | INTRAMUSCULAR | Status: AC
  Administered 2019-03-27: 15 mg via INTRAVENOUS

## 2019-03-27 MED ORDER — PROPOFOL 10 MG/ML IV BOLUS
INTRAVENOUS | Status: AC
  Filled 2019-03-27: qty 20

## 2019-03-27 MED ORDER — SCOPOLAMINE 1 MG/3DAYS TD PT72
1.0000 | MEDICATED_PATCH | TRANSDERMAL | Status: DC
  Administered 2019-03-27: 1.5 mg via TRANSDERMAL
  Filled 2019-03-27: qty 1

## 2019-03-27 MED ORDER — DIPHENHYDRAMINE HCL 12.5 MG/5ML PO ELIX: 12.5000 mg | ORAL_SOLUTION | Freq: Four times a day (QID) | ORAL | Status: DC | PRN

## 2019-03-27 MED ORDER — SODIUM CHLORIDE 0.9 % IV BOLUS: 1000.0000 mL | Freq: Once | INTRAVENOUS | Status: DC

## 2019-03-27 MED ORDER — ACETAMINOPHEN 650 MG RE SUPP: 650.0000 mg | Freq: Four times a day (QID) | RECTAL | Status: DC | PRN

## 2019-03-27 MED ORDER — ALUM & MAG HYDROXIDE-SIMETH 200-200-20 MG/5ML PO SUSP: 15.0000 mL | ORAL | Status: DC | PRN

## 2019-03-27 MED ORDER — BUPIVACAINE LIPOSOME 1.3 % IJ SUSP
INTRAMUSCULAR | Status: DC | PRN
  Administered 2019-03-27: 20 mL

## 2019-03-27 MED ORDER — CHLORHEXIDINE GLUCONATE CLOTH 2 % EX PADS
6.0000 | MEDICATED_PAD | Freq: Every day | CUTANEOUS | Status: DC
  Administered 2019-03-28 – 2019-04-03 (×7): 6 via TOPICAL

## 2019-03-27 SURGICAL SUPPLY — 46 items
CELLS DAT CNTRL 66122 CELL SVR (MISCELLANEOUS) ×2 IMPLANT
CHLORAPREP W/TINT 26 (MISCELLANEOUS) ×3 IMPLANT
CLOTH BEACON ORANGE TIMEOUT ST (SAFETY) ×3 IMPLANT
COVER LIGHT HANDLE STERIS (MISCELLANEOUS) ×6 IMPLANT
COVER WAND RF STERILE (DRAPES) ×2 IMPLANT
DRAPE WARM FLUID 44X44 (DRAPES) ×3 IMPLANT
DRSG OPSITE POSTOP 4X10 (GAUZE/BANDAGES/DRESSINGS) ×3 IMPLANT
ELECT BLADE 6 FLAT ULTRCLN (ELECTRODE) ×2 IMPLANT
ELECT REM PT RETURN 9FT ADLT (ELECTROSURGICAL) ×3
ELECTRODE REM PT RTRN 9FT ADLT (ELECTROSURGICAL) ×2 IMPLANT
GAUZE SPONGE 4X4 12PLY STRL (GAUZE/BANDAGES/DRESSINGS) ×3 IMPLANT
GLOVE BIO SURGEON STRL SZ7 (GLOVE) ×4 IMPLANT
GLOVE BIOGEL PI IND STRL 7.0 (GLOVE) ×5 IMPLANT
GLOVE BIOGEL PI INDICATOR 7.0 (GLOVE) ×3
GLOVE SURG SS PI 7.5 STRL IVOR (GLOVE) ×3 IMPLANT
GOWN STRL REUS W/TWL LRG LVL3 (GOWN DISPOSABLE) ×9 IMPLANT
HANDLE SUCTION POOLE (INSTRUMENTS) ×1 IMPLANT
HEMOSTAT SNOW SURGICEL 2X4 (HEMOSTASIS) ×2 IMPLANT
INST SET MAJOR GENERAL (KITS) ×3 IMPLANT
KIT TURNOVER KIT A (KITS) ×3 IMPLANT
LIGASURE IMPACT 36 18CM CVD LR (INSTRUMENTS) ×3 IMPLANT
MANIFOLD NEPTUNE II (INSTRUMENTS) ×3 IMPLANT
NDL BIOPSY 14GX4.5 SOFT TIS (NEEDLE) IMPLANT
NDL HYPO 18GX1.5 BLUNT FILL (NEEDLE) ×1 IMPLANT
NEEDLE BIOPSY 14GX4.5 SOFT TIS (NEEDLE) ×3 IMPLANT
NEEDLE HYPO 18GX1.5 BLUNT FILL (NEEDLE) ×3 IMPLANT
NS IRRIG 1000ML POUR BTL (IV SOLUTION) ×6 IMPLANT
PACK ABDOMINAL MAJOR (CUSTOM PROCEDURE TRAY) ×3 IMPLANT
PAD ARMBOARD 7.5X6 YLW CONV (MISCELLANEOUS) ×3 IMPLANT
RELOAD LINEAR CUT PROX 55 BLUE (ENDOMECHANICALS) ×6 IMPLANT
RELOAD STAPLE 55 3.8 BLU REG (ENDOMECHANICALS) IMPLANT
RETRACTOR WND ALEXIS 18 MED (MISCELLANEOUS) ×1 IMPLANT
RETRACTOR WND ALEXIS-O 25 LRG (MISCELLANEOUS) ×1 IMPLANT
RTRCTR WOUND ALEXIS 18CM MED (MISCELLANEOUS) ×3
RTRCTR WOUND ALEXIS O 25CM LRG (MISCELLANEOUS) ×3
SET BASIN LINEN APH (SET/KITS/TRAYS/PACK) ×3 IMPLANT
SPONGE LAP 18X18 RF (DISPOSABLE) ×3 IMPLANT
STAPLER GUN LINEAR PROX 60 (STAPLE) ×2 IMPLANT
STAPLER PROXIMATE 55 BLUE (STAPLE) ×2 IMPLANT
STAPLER VISISTAT (STAPLE) ×3 IMPLANT
SUCTION POOLE HANDLE (INSTRUMENTS) ×3
SUT CHROMIC 2 0 SH (SUTURE) ×2 IMPLANT
SUT PDS AB 0 CTX 60 (SUTURE) ×4 IMPLANT
SUT SILK 3 0 SH CR/8 (SUTURE) ×2 IMPLANT
SYR 20ML LL LF (SYRINGE) ×3 IMPLANT
TRAY FOLEY MTR SLVR 16FR STAT (SET/KITS/TRAYS/PACK) ×3 IMPLANT

## 2019-03-27 NOTE — Anesthesia Postprocedure Evaluation (Signed)
Anesthesia Post Note  Patient: [name]  Procedure(s) Performed: PARTIAL SMALL BOWEL RESECTION (N/A Abdomen) LIVER BIOPSY (N/A Abdomen)  Patient location during evaluation: PACU Anesthesia Type: General Level of consciousness: awake and alert and oriented Pain management: pain level controlled Vital Signs Assessment: post-procedure vital signs reviewed and stable Respiratory status: spontaneous breathing and patient connected to face mask oxygen Cardiovascular status: stable : Some nausea; zofran repeated in PACU. Anesthetic complications: no     Last Vitals:  Vitals:   03/27/19 0855 03/27/19 1145  BP: 136/65 (!) 125/59  Pulse: 84 82  Resp: 16 (!) 23  Temp: 36.7 C 36.5 C  SpO2: 93% 100%    Last Pain:  Vitals:   03/27/19 0855  TempSrc: Oral  PainSc: 2                  Hadlyn Amero A

## 2019-03-27 NOTE — Anesthesia Procedure Notes (Signed)
Procedure Name: Intubation Date/Time: 03/27/2019 10:04 AM Performed by: Ollen Bowl, CRNA Pre-anesthesia Checklist: Patient identified, Patient being monitored, Timeout performed, Emergency Drugs available and Suction available Patient Re-evaluated:Patient Re-evaluated prior to induction Oxygen Delivery Method: Circle system utilized Preoxygenation: Pre-oxygenation with 100% oxygen Induction Type: IV induction Ventilation: Mask ventilation without difficulty Laryngoscope Size: Mac and 3 Grade View: Grade I Tube type: Oral Tube size: 7.0 mm Number of attempts: 1 Airway Equipment and Method: Stylet Placement Confirmation: ETT inserted through vocal cords under direct vision,  positive ETCO2 and breath sounds checked- equal and bilateral Secured at: 18 cm Tube secured with: Tape Dental Injury: Teeth and Oropharynx as per pre-operative assessment

## 2019-03-27 NOTE — Progress Notes (Signed)
Patient has had approximately 100cc of urinary output since beginning of shift at 1900.  Vitals otherwise stable, patient is alert and oriented.  Notified MD of patient output.  Received order for normal saline bolus.  Will continue to monitor patient.

## 2019-03-27 NOTE — Interval H&P Note (Signed)
History and Physical Interval Note:  03/27/2019 8:59 AM  [name]  has presented today for surgery, with the diagnosis of small bowel obstruction.  The various methods of treatment have been discussed with the patient and family. After consideration of risks, benefits and other options for treatment, the patient has consented to  Procedure(s): EXPLORATORY LAPAROTOMY (N/A) as a surgical intervention.  The patient's history has been reviewed, patient examined, no change in status, stable for surgery.  I have reviewed the patient's chart and labs.  Questions were answered to the patient's satisfaction.     Aviva Signs

## 2019-03-27 NOTE — Progress Notes (Signed)
PROGRESS NOTE [facility]   LAURIANA POTTEIGER  UVO:[id number]  DOB: 08/02/1928  DOA: 03/21/2019 PCP: Babs Sciara, MD   Brief Admission Hx: 86 y.o. 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  She reports that she has been having difficulty eating and drinking and having nausea vomiting and diarrhea.   MDM/Assessment & Plan:    1. Recurrent partial SBO - pt started having recurrent symptoms after lunch 1/9, xray confirms recurrence of partial SBO.  Patient was seen by general surgeon Dr. Lovell Sheehan taking to OR 03/27/19 for exploratory laparotomy and lysis of adhesions.  2. Mild dehydration-Improved with IV fluids.   Patient has had poor oral intake for quite some time. She presented with diarrhea and nausea and vomiting which has resolved.  She has a mild AKI.  Treating with IV fluid hydration.  Monitor electrolytes closely. 3. AKI - improved with IV fluids.  4. Nausea vomiting diarrhea-resolved now.   No recent antibiotic use.   5. Abdominal pain - secondary to recurrent partial SBO.  IV pain meds as needed. 6. Guaiac positive stools-She was heme positive on admission.  GI is following. Hg stable.  With her history of colon cancer and it appears that the cancer was removed with clear margins in September 2020.  recheck CBC in AM.  7. Iron deficiency anemia due to chronic blood loss-type and screen and transfuse if needed. 8.  COPD-stable resume home bronchodilators. 9. Stage IV CKD-monitor creatinine daily with hydration. 10. DNR on admission-palliative medicine consult as patient needs to be at the end of life and progressive debility.  She had elected home hospice services.   11. Physical debility-PT recommending SNF but patient declining to go but agreed to home hospice.    DVT prophylaxis: SCDs Code Status: DNR  Family Communication:   Disposition Plan:  Patient cannot be discharged today because she is having surgery today for exploratory laparotomy and lysis of adhesion Admission status:  INP  Consultants:  GI  palliative  Procedures:    Antimicrobials:     Subjective: Pt reports much less abdominal bloating and distention and pain.  Patient is agreeable to surgery today.  Objective: Vitals:   03/26/19 2042 03/27/19 0523 03/27/19 0600 03/27/19 0855  BP: 119/60 (!) 108/57  136/65  Pulse: 79 78  84  Resp: 20 20  16   Temp: 98.4 F (36.9 C) 98.9 F (37.2 C)  98.1 F (36.7 C)  TempSrc: Oral Oral  Oral  SpO2: 95% 97%  93%  Weight:   55.8 kg   Height:        Intake/Output Summary (Last 24 hours) at 03/27/2019 1100 Last data filed at 03/27/2019 1047 Gross per 24 hour  Intake 360 ml  Output 550 ml  Net -190 ml   Filed Weights   03/21/19 0747 03/21/19 1936 03/27/19 0600  Weight: 55.2 kg 53.1 kg 55.8 kg   REVIEW OF SYSTEMS  As per history otherwise all reviewed and reported negative  Exam:  General exam: frail elderly female, awake, alert, NAD.  Respiratory system:  No increased work of breathing. Cardiovascular system: normal S1 & S2 heard. No JVD, murmurs, gallops, clicks or pedal edema. Gastrointestinal system: Abdomen less distended and persistently tender with palpation soft and RLQ and epigastric tenderness. Active bowel sounds heard. Central nervous system: Alert and oriented. No focal neurological deficits. Extremities: no CCE.   Data Reviewed: Basic Metabolic Panel:  Recent Labs  Lab 03/21/19 1050 03/22/19 0544 03/23/19 0751 03/24/19 0813 03/25/19 0631 03/26/19 0549  NA 142 142 139  --  139 139  K 3.8 3.6 3.5  --  3.5 3.9  CL 104 108 108  --  107 107  CO2 26 25 25   --  24 24  GLUCOSE 93 71 82  --  104* 99  BUN 41* 35* 24*  --  27* 27*  CREATININE 2.01* 1.81* 1.59*  --  1.83* 1.69*  CALCIUM 7.5* 7.4* 7.4*  --  7.8* 7.9*  MG  --  1.9 1.6* 2.0  --   --    Liver Function Tests: Recent Labs  Lab 03/21/19 1050 03/22/19 0544  AST 25 26  ALT 23 23  ALKPHOS 58 60  BILITOT 0.7 0.6  PROT 4.5* 4.5*  ALBUMIN 2.3* 2.3*   No results for input(s): LIPASE, AMYLASE in the last 168 hours. No results for input(s): AMMONIA in the last 168 hours. CBC: Recent Labs  Lab 03/22/19 0544 03/23/19 0751 03/24/19 1639 03/25/19 0631 03/26/19 0549  WBC 7.1 5.4 6.6 7.1 6.8  NEUTROABS 5.6 3.8 4.9 5.5 4.9  HGB 8.2* 8.2* 8.4* 8.5* 8.5*  HCT 28.7* 28.3* 28.2* 28.7* 29.0*  MCV 103.2* 101.4* 100.0 100.0 99.7  PLT 250 247 223 225 191   Cardiac Enzymes: No results for input(s): CKTOTAL, CKMB, CKMBINDEX, TROPONINI in the last 168 hours. CBG (last 3)  No results for input(s): GLUCAP in the last 72 hours. Recent Results (from the past 240 hour(s))  SARS CORONAVIRUS 2 (TAT 6-24 HRS) Nasopharyngeal Nasopharyngeal Swab     Status: None   Collection Time: 03/21/19  1:17 PM   Specimen: Nasopharyngeal Swab  Result Value Ref Range Status   SARS Coronavirus 2 NEGATIVE NEGATIVE Final    Comment: (NOTE) SARS-CoV-2 target nucleic acids are NOT DETECTED. The SARS-CoV-2 RNA is generally detectable in upper and lower respiratory specimens during the acute phase of infection. Negative results do not preclude SARS-CoV-2 infection, do not rule out co-infections with other pathogens, and should not be used as the sole basis for treatment or other patient management decisions. Negative results must be combined with clinical observations, patient history, and epidemiological  information. The expected result is Negative. Fact Sheet for Patients: HairSlick.no Fact Sheet for Healthcare Providers: quierodirigir.com This test is not yet approved or cleared by the [place] FDA and  has been authorized for detection and/or diagnosis of SARS-CoV-2 by FDA under an Emergency Use Authorization (EUA). This EUA will remain  in effect (meaning this test can be used) for the duration of the COVID-19 declaration under Section 56 4(b)(1) of the Act, 21 U.S.C. section 360bbb-3(b)(1), unless the authorization is terminated or revoked sooner. Performed at [facility], [address] [place], [place] [address]   GI pathogen panel by PCR, stool     Status: None   Collection Time: 03/23/19  1:56 PM   Specimen: Stool  Result Value Ref Range Status   Plesiomonas shigelloides NOT DETECTED NOT DETECTED Final   Yersinia enterocolitica NOT DETECTED NOT DETECTED Final   Vibrio NOT DETECTED NOT  DETECTED Final   Enteropathogenic E coli NOT DETECTED NOT DETECTED Final   E coli (ETEC) LT/ST NOT DETECTED NOT DETECTED Final   E coli 0157 by PCR Not applicable NOT DETECTED Final   Cryptosporidium by PCR NOT DETECTED NOT DETECTED Final   Entamoeba histolytica NOT DETECTED NOT DETECTED Final   Adenovirus F 40/41 NOT DETECTED NOT DETECTED Final   Norovirus GI/GII NOT DETECTED NOT DETECTED Final   Sapovirus NOT DETECTED NOT DETECTED Final    Comment: (NOTE) Performed At: [facility] [address] [place], [place] [id number] Jolene Schimke MD WU:[id number]    Vibrio cholerae NOT DETECTED NOT DETECTED Final   Campylobacter by PCR NOT DETECTED NOT DETECTED Final   Salmonella by PCR NOT DETECTED NOT DETECTED Final   E coli (STEC) NOT DETECTED NOT DETECTED Final   Enteroaggregative E coli NOT DETECTED NOT DETECTED Final   Shigella by PCR NOT DETECTED NOT DETECTED Final   Cyclospora cayetanensis NOT DETECTED NOT  DETECTED Final   Astrovirus NOT DETECTED NOT DETECTED Final   G lamblia by PCR NOT DETECTED NOT DETECTED Final   Rotavirus A by PCR NOT DETECTED NOT DETECTED Final  Surgical pcr screen     Status: None   Collection Time: 03/26/19  6:46 PM   Specimen: Nasal Mucosa; Nasal Swab  Result Value Ref Range Status   MRSA, PCR NEGATIVE NEGATIVE Final   Staphylococcus aureus NEGATIVE NEGATIVE Final    Comment: (NOTE) The Xpert SA Assay (FDA approved for NASAL specimens in patients 20 years of age and older), is one component of a comprehensive surveillance program. It is not intended to diagnose infection nor to guide or monitor treatment. Performed at [facility], [address], [place], [place] [address]      Studies: DG Abd 1 View  Result Date: 03/26/2019 CLINICAL DATA:  Small-bowel obstruction EXAM: ABDOMEN - 1 VIEW COMPARISON:  March 25, 2019 FINDINGS: There remains mild air-filled dilated loops of small bowel seen within the mid abdomen measuring up to 4.4 cm. There is air seen to the level of the rectum. No acute osseous abnormality. IMPRESSION: No significant change in findings suggestive of partial small bowel obstruction Electronically Signed   By: Jonna Clark M.D.   On: 03/26/2019 06:04   Scheduled Meds: . [MAR Hold] amLODipine  5 mg Oral Daily  . [MAR Hold] anastrozole  1 mg Oral QPM  . [MAR Hold] feeding supplement (ENSURE ENLIVE)  237 mL Oral BID BM  . [MAR Hold] multivitamin with minerals  1 tablet Oral Daily  . [MAR Hold] pantoprazole (PROTONIX) IV  40 mg Intravenous Q24H  . [MAR Hold] polyethylene glycol  17 g Oral BID  . [MAR Hold] QUEtiapine  25 mg Oral QHS   Continuous Infusions: . sodium chloride 50 mL/hr at 03/26/19 0300  . 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  1. Check the care team in [facility] and look for a) attending/consulting [facility] provider listed and b) the [facility] team listed 2. Log into [email or web address] and use [facility]'s universal password to access. If you do not have the password, please contact the hospital operator. 3. Locate the [facility] provider you are looking for under [facility] and page to a number that you can be directly reached. 4. If you still have difficulty reaching the provider, please page the [facility] (Director on Call) for the Hospitalists listed on amion for assistance.

## 2019-03-27 NOTE — Anesthesia Preprocedure Evaluation (Signed)
Anesthesia Evaluation  Patient identified by MRN, date of birth, ID band Patient awake  General Assessment Comment:Pt with personal and FH of PONV  Reviewed: Allergy & Precautions, NPO status , Patient's Chart, lab work & pertinent test results  History of Anesthesia Complications (+) PONV and history of anesthetic complications  Airway Mallampati: I  TM Distance: >3 FB Neck ROM: Full    Dental no notable dental hx. (+) Edentulous Upper, Edentulous Lower   Pulmonary shortness of breath and with exertion, COPD,  COPD inhaler and oxygen dependent, former smoker,  Known pulm nodules watching    Pulmonary exam normal breath sounds clear to auscultation       Cardiovascular Exercise Tolerance: Poor hypertension, Pt. on medications negative cardio ROS Normal cardiovascular examII Rhythm:Regular Rate:Normal  Normal Echo 04/2018  Denies known cardiac issues    Neuro/Psych  Neuromuscular disease negative psych ROS   GI/Hepatic Neg liver ROS, GERD  Medicated and Controlled,Here for Exp lap for recurrent SBO Hopeful just LOA, possible resection   Endo/Other  negative endocrine ROSdiabetes  Renal/GU Renal InsufficiencyRenal disease  negative genitourinary   Musculoskeletal  (+) Arthritis , Osteoarthritis,    Abdominal   Peds negative pediatric ROS (+)  Hematology negative hematology ROS (+) anemia ,   Anesthesia Other Findings H/o Breast Ca -seeds   Reproductive/Obstetrics negative OB ROS                             Anesthesia Physical Anesthesia Plan  ASA: IV  Anesthesia Plan: General   Post-op Pain Management:    Induction: Intravenous  PONV Risk Score and Plan: 4 or greater and Ondansetron, Dexamethasone, Scopolamine patch - Pre-op, Treatment may vary due to age or medical condition and Promethazine  Airway Management Planned: Oral ETT  Additional Equipment:   Intra-op Plan:    Post-operative Plan: Extubation in OR  Informed Consent: I have reviewed the patients History and Physical, chart, labs and discussed the procedure including the risks, benefits and alternatives for the proposed anesthesia with the patient or authorized representative who has indicated his/her understanding and acceptance.   Patient has DNR.  Suspend DNR.   Dental advisory given  Plan Discussed with: CRNA  Anesthesia Plan Comments: (Plan Full PPE use Plan GETA D/W PT -WTP with same after Q&A  Plan rescind DNR in periop period -d/w pt -voiced understanding WTP D/w possible need for postop ventilation is a possibility -voiced understanding WTP)        Anesthesia Quick Evaluation

## 2019-03-27 NOTE — Progress Notes (Signed)
  Subjective:  Patient complains of pain in both iliac fossae.  She says pain is mild.  She had 2 loose bowel movements during the night.  She denies nausea vomiting chest pain or shortness of breath.  Objective: Blood pressure (!) 108/57, pulse 78, temperature 98.9 F (37.2 C), temperature source Oral, resp. rate 20, height '5\' 4"'$  (1.626 m), weight 55.8 kg, SpO2 97 %. Patient is alert and in no acute distress. Cardiac exam with regular rhythm normal S1 and S2.  Systolic murmur at aortic area is unchanged. Auscultation lungs reveal vesicular breath sounds bilaterally. Abdomen is full.  Midline scar noted.  Bowel sounds are hypoactive.  On palpation abdomen is soft with mild generalized tenderness but no guarding rebound.  Percussion note is very templated.  No organomegaly or masses. No LE edema noted.  Labs/studies Results:  CBC Latest Ref Rng & Units 03/26/2019 03/25/2019 03/24/2019  WBC 4.0 - 10.5 K/uL 6.8 7.1 6.6  Hemoglobin 12.0 - 15.0 g/dL 8.5(L) 8.5(L) 8.4(L)  Hematocrit 36.0 - 46.0 % 29.0(L) 28.7(L) 28.2(L)  Platelets 150 - 400 K/uL 191 225 223    CMP Latest Ref Rng & Units 03/26/2019 03/25/2019 03/23/2019  Glucose 70 - 99 mg/dL 99 104(H) 82  BUN 8 - 23 mg/dL 27(H) 27(H) 24(H)  Creatinine 0.44 - 1.00 mg/dL 1.69(H) 1.83(H) 1.59(H)  Sodium 135 - 145 mmol/L 139 139 139  Potassium 3.5 - 5.1 mmol/L 3.9 3.5 3.5  Chloride 98 - 111 mmol/L 107 107 108  CO2 22 - 32 mmol/L '24 24 25  '$ Calcium 8.9 - 10.3 mg/dL 7.9(L) 7.8(L) 7.4(L)  Total Protein 6.5 - 8.1 g/dL - - -  Total Bilirubin 0.3 - 1.2 mg/dL - - -  Alkaline Phos 38 - 126 U/L - - -  AST 15 - 41 U/L - - -  ALT 0 - 44 U/L - - -    Hepatic Function Latest Ref Rng & Units 03/22/2019 03/21/2019 03/18/2019  Total Protein 6.5 - 8.1 g/dL 4.5(L) 4.5(L) 5.3(L)  Albumin 3.5 - 5.0 g/dL 2.3(L) 2.3(L) 2.7(L)  AST 15 - 41 U/L '26 25 18  '$ ALT 0 - 44 U/L '23 23 21  '$ Alk Phosphatase 38 - 126 U/L 60 58 64  Total Bilirubin 0.3 - 1.2 mg/dL 0.6 0.7 1.1  Bilirubin,  Direct 0.0 - 0.2 mg/dL - - -    GI pathogen panel is negative. Blood work from this morning is pending.  Assessment:  #1.  Recurrent small bowel obstruction.  She now appears to have partial small bowel obstruction.  This is the third admission.  Hopefully all she has is adhesive disease and would not require another bowel resection.  She has been evaluated by surgical service.  She is having surgery by Dr. Arnoldo Morale later this morning.  #2.  History of colon carcinoma.  She is status post open right hemicolectomy in September 2020.  No evidence of residual disease.  She has mildly elevated but stable CEA level.  Preoperative is 12 and hours around 11.  #3.  Nausea vomiting and diarrhea would appear to be due to recurrent bowel obstruction.  No evidence of infection.  #4.  Anemia.  She was heme positive on admission.  Hemoglobin has gradually improved.  Occult GI bleed may be related to small bowel injury due to recurrent bowel obstruction.  No symptoms to suggest peptic ulcer disease but she remains on IV pantoprazole.  #5.  Chronic kidney disease.  Recommendations  Discontinue metronidazole.

## 2019-03-27 NOTE — Transfer of Care (Signed)
Immediate Anesthesia Transfer of Care Note  Patient: [name]  Procedure(s) Performed: PARTIAL SMALL BOWEL RESECTION (N/A Abdomen) LIVER BIOPSY (N/A Abdomen)  Patient Location: PACU  Anesthesia Type:General  Level of Consciousness: awake, oriented and patient cooperative  Airway & Oxygen Therapy: Patient Spontanous Breathing and Patient connected to face mask oxygen  Post-op Assessment: Report given to RN and Post -op Vital signs reviewed and stable  Post vital signs: Reviewed and stable  Last Vitals:  Vitals Value Taken Time  BP 125/59 03/27/19 1145  Temp    Pulse 84 03/27/19 1149  Resp 16 03/27/19 1149  SpO2 100 % 03/27/19 1149  Vitals shown include unvalidated device data.  Last Pain:  Vitals:   03/27/19 0855  TempSrc: Oral  PainSc: 2       Patients Stated Pain Goal: 1 (05/05/77 8102)  Complications: No apparent anesthesia complications

## 2019-03-27 NOTE — Op Note (Signed)
Patient:  [name]  DOB:  Jul 16, 1928  MRN:  [id number]   Preop Diagnosis: Small bowel obstruction  Postop Diagnosis: Same  Procedure: Exploratory laparotomy, partial small bowel resection, wedge liver biopsy  Surgeon: Aviva Signs, MD  Anes: General endotracheal  Indications: Patient is an 86 year old white female status post an ileocecectomy at another facility in the fall 2020 who now presents with recurrent episodes of partial small bowel obstruction.  She has failed medical therapy and now presents for an exploratory laparotomy.  The risks and benefits of the procedure including bleeding, infection, bowel resection, cardiopulmonary difficulties, and the possibility of a blood transfusion were fully explained to the patient, who gave informed consent.  Procedure note: The patient was placed in the supine position.  After induction of general endotracheal anesthesia, the abdomen was prepped and draped using the usual sterile technique with ChloraPrep.  Surgical site confirmation was performed.  A midline incision was made starting from just above the patient's previous surgical incision site and carried down to below the umbilicus.  Peritoneal cavity was entered into without difficulty.  There was a significant amount of free fluid consistent with her chronic bowel obstruction present.  The bowel was noted to be dilated proximally.  The bowel was eviscerated and was inspected from the ligament of Treitz to the terminal ileum.  Approximately 1 to 2 feet from the terminal ileum, there were adhesive bands of bowel into the pelvis and into the right pelvis.  There appeared to be necrotic omentum that was attached to the right inguinal region.  It was difficult to tell whether this was just necrotic omentum or tumor implant given her previous diagnosis of colon cancer.  The right colon was mobilized along its peritoneal reflection.  The anastomosis that was done previously was widely  patent.  The small bowel was grossly adherent to this right lower quadrant granulomatous mass and during removal, the bowel was entered into.  Minimal spillage was noted.  Once the distal small bowel was relieved of age attachments to the right lower quadrant, a small bowel resection was performed.  This encompassed approximately 8 to 12 inches of small bowel.  This was proximal to the ileocolic anastomosis.  GIA staplers were placed proximally and distally around the affected bowel.  Mesentery was then divided using the LigaSure.  The specimen was then removed from the operative field.  A side to side enteroenterotomy was performed using the GIA 55 stapler.  The enterotomy was closed using a TA 60 stapler.  The staple line was bolstered using 3-0 silk Lembert sutures.  The mesenteric defect was closed using a 2-0 Chromic Gut running suture.  The bowel was then returned into the abdominal cavity in an orderly fashion.  The right lobe of the liver had multiple small hard nodules present.  I elected to excise 1 of those nodules along the lateral aspect of the right lobe of the liver.  This was done using Bovie electrocautery.  The specimen was sent to pathology further examination.  A bleeding was controlled using Bovie electrocautery.  Surgicel snow was then placed over the biopsy site.  I was also able to excise the granulomatous mass in the right lower quadrant of the abdomen that was causing infiltration into the small bowel and resultant bowel obstruction.  All were sent to pathology to rule out metastatic disease.  The abdominal cavity was then copiously irrigated with normal saline.  All operating personnel then changed their gloves.  The fascia  was reapproximated using a looped 0 PDS running suture.  Subcutaneous layer was irrigated with normal saline.  Exparel was instilled into the surrounding wound.  The skin was closed using staples.  Betadine ointment and dry sterile dressings were applied.  All tape  and needle counts were correct at the end of the procedure.  Patient was extubated in the operating room and transferred to PACU in stable condition.  Complications: None  EBL: 100 cc  Specimen: Small bowel, tumor implant right lower quadrant, liver nodule

## 2019-03-28 ENCOUNTER — Ambulatory Visit (INDEPENDENT_AMBULATORY_CARE_PROVIDER_SITE_OTHER): Payer: Medicare Other | Admitting: Gastroenterology

## 2019-03-28 ENCOUNTER — Other Ambulatory Visit: Payer: Self-pay

## 2019-03-28 ENCOUNTER — Ambulatory Visit: Payer: Medicare Other | Admitting: Internal Medicine

## 2019-03-28 LAB — CBC
HCT: 29.3 % — ABNORMAL LOW (ref 36.0–46.0)
Hemoglobin: 8.7 g/dL — ABNORMAL LOW (ref 12.0–15.0)
MCH: 29.9 pg (ref 26.0–34.0)
MCHC: 29.7 g/dL — ABNORMAL LOW (ref 30.0–36.0)
MCV: 100.7 fL — ABNORMAL HIGH (ref 80.0–100.0)
Platelets: 235 10*3/uL (ref 150–400)
RBC: 2.91 MIL/uL — ABNORMAL LOW (ref 3.87–5.11)
RDW: 15.2 % (ref 11.5–15.5)
WBC: 13.1 10*3/uL — ABNORMAL HIGH (ref 4.0–10.5)
nRBC: 0 % (ref 0.0–0.2)

## 2019-03-28 LAB — BASIC METABOLIC PANEL
Anion gap: 9 (ref 5–15)
BUN: 32 mg/dL — ABNORMAL HIGH (ref 8–23)
CO2: 20 mmol/L — ABNORMAL LOW (ref 22–32)
Calcium: 7.3 mg/dL — ABNORMAL LOW (ref 8.9–10.3)
Chloride: 111 mmol/L (ref 98–111)
Creatinine, Ser: 1.99 mg/dL — ABNORMAL HIGH (ref 0.44–1.00)
GFR calc Af Amer: 25 mL/min — ABNORMAL LOW (ref >60)
GFR calc non Af Amer: 22 mL/min — ABNORMAL LOW (ref >60)
Glucose, Bld: 94 mg/dL (ref 70–99)
Potassium: 4.7 mmol/L (ref 3.5–5.1)
Sodium: 140 mmol/L (ref 135–145)

## 2019-03-28 LAB — SURGICAL PATHOLOGY

## 2019-03-28 LAB — MAGNESIUM: Magnesium: 1.6 mg/dL — ABNORMAL LOW (ref 1.7–2.4)

## 2019-03-28 LAB — PHOSPHORUS: Phosphorus: 3.4 mg/dL (ref 2.5–4.6)

## 2019-03-28 MED ORDER — SODIUM CHLORIDE 0.9 % IV BOLUS
1000.0000 mL | Freq: Once | INTRAVENOUS | Status: AC
  Administered 2019-03-28: 1000 mL via INTRAVENOUS

## 2019-03-28 NOTE — Progress Notes (Signed)
1 Day Post-Op  Subjective: [name] is a very sweet 86 year old female 1 day post op s/p partial small bowel resection with liver biopsy. Pt is feeling well overall, with some diffuse abdominal pain. She is tolerating clears well. Endorses flatus but has not had a bowel movement. Has not sat up in a chair or ambulated yet, but feels she would like to. She endorses some mild conversational dyspnea, denies any fevers/chills/chest pain. Has been using incentive spirometer.   Objective: Vital signs in last 24 hours: Temp:  [97.3 F (36.3 C)-97.8 F (36.6 C)] 97.6 F (36.4 C) (01/13 0601) Pulse Rate:  [75-99] 99 (01/13 0601) Resp:  [18-26] 18 (01/13 0601) BP: (105-125)/(52-69) 118/69 (01/13 0601) SpO2:  [92 %-100 %] 98 % (01/13 0601) Last BM Date: 03/24/19  Intake/Output from previous day: 01/12 0701 - 01/13 0700 In: 3058.5 [I.V.:2058.5; IV Piggyback:1000] Out: 225 [Urine:125; Blood:100] Intake/Output this shift: No intake/output data recorded.  Physical Exam:  General: Well appearing, alert 86 y/o female in no acute distress Cardiac: S1, S2, RRR, no murmurs/rubs/gallops appreciated on this exam Respiratory: Lungs CTAB, pt breathing comfortably on room air and speaking in full sentences Abdominal: NABS, abdomen diffusely tender and mildly distended. Incision healing well. Extremities: SCDs in place   Lab Results:  Recent Labs    03/26/19 0549 03/28/19 0609  WBC 6.8 13.1*  HGB 8.5* 8.7*  HCT 29.0* 29.3*  PLT 191 235   BMET Recent Labs    03/26/19 0549 03/28/19 0609  NA 139 140  K 3.9 4.7  CL 107 111  CO2 24 20*  GLUCOSE 99 94  BUN 27* 32*  CREATININE 1.69* 1.99*  CALCIUM 7.9* 7.3*   PT/INR No results for input(s): LABPROT, INR in the last 72 hours.  Studies/Results: No results found.  Anti-infectives: Anti-infectives (From admission, onward)   Start     Dose/Rate Route Frequency Ordered Stop   03/27/19 0600  ciprofloxacin (CIPRO) IVPB 400 mg     400  mg 200 mL/hr over 60 Minutes Intravenous On call to O.R. 03/26/19 1550 03/27/19 0955   03/23/19 1345  metroNIDAZOLE (FLAGYL) tablet 250 mg  Status:  Discontinued     250 mg Oral 3 times daily after meals 03/23/19 1343 03/27/19 0735      Assessment/Plan: s/p Procedure(s): PARTIAL SMALL BOWEL RESECTION LIVER BIOPSY  [name] is recovering well on POD 1. Plan to replete magnesium and await return of bowel function before advancing diet. Macrocytic anemia noted-- improved from yesterday and seems to be around pt's baseline. B12 and folate wnl September 2020. Will continue to monitor. Urine output still low in the setting of a fluid challenge, pt has a baseline elevated creatinine and may have some underlying kidney disease. Will continue to monitor. Encouraging ambulation today as well as use of incentive spirometer.    LOS: 6 days    Madelaine Etienne MSIII 03/28/2019

## 2019-03-28 NOTE — Care Management Important Message (Signed)
Important Message  Patient Details  Name: [name] MRN: [id number] Date of Birth: March 02, 1929   Medicare Important Message Given:  Yes     Tommy Medal 03/28/2019, 2:54 PM

## 2019-03-28 NOTE — Progress Notes (Signed)
  Subjective:  Patient complains of pain/soreness across lower abdomen.  She states she had 1 bowel movement earlier today.  Was loose.  She has passed flatus multiple times.  She states she sat up in a chair.  She feels weak.  She denies nausea or vomiting.  She is not having any difficulty with liquids. Objective: Blood pressure 137/69, pulse (!) 102, temperature 97.6 F (36.4 C), temperature source Oral, resp. rate 19, height _0  (1.626 m), weight 55.8 kg, SpO2 98 %. Patient is alert and in no acute distress. Cardiac exam with regular rhythm normal S1 and S2.  Systolic murmur at aortic area is unchanged. Auscultation lungs reveal vesicular breath sounds bilaterally. Abdomen is is mildly distended.  Bowel sounds are normal.  She has mild generalized tenderness.  No rebound.  Labs/studies Results:   CBC Latest Ref Rng & Units 03/28/2019 03/26/2019 03/25/2019  WBC 4.0 - 10.5 K/uL 13.1(H) 6.8 7.1  Hemoglobin 12.0 - 15.0 g/dL 8.7(L) 8.5(L) 8.5(L)  Hematocrit 36.0 - 46.0 % 29.3(L) 29.0(L) 28.7(L)  Platelets 150 - 400 K/uL 235 191 225    CMP Latest Ref Rng & Units 03/28/2019 03/26/2019 03/25/2019  Glucose 70 - 99 mg/dL 94 99 104(H)  BUN 8 - 23 mg/dL 32(H) 27(H) 27(H)  Creatinine 0.44 - 1.00 mg/dL 1.99(H) 1.69(H) 1.83(H)  Sodium 135 - 145 mmol/L 140 139 139  Potassium 3.5 - 5.1 mmol/L 4.7 3.9 3.5  Chloride 98 - 111 mmol/L 111 107 107  CO2 22 - 32 mmol/L 20(L) 24 24  Calcium 8.9 - 10.3 mg/dL 7.3(L) 7.9(L) 7.8(L)  Total Protein 6.5 - 8.1 g/dL - - -  Total Bilirubin 0.3 - 1.2 mg/dL - - -  Alkaline Phos 38 - 126 U/L - - -  AST 15 - 41 U/L - - -  ALT 0 - 44 U/L - - -    Hepatic Function Latest Ref Rng & Units 03/22/2019 03/21/2019 03/18/2019  Total Protein 6.5 - 8.1 g/dL 4.5(L) 4.5(L) 5.3(L)  Albumin 3.5 - 5.0 g/dL 2.3(L) 2.3(L) 2.7(L)  AST 15 - 41 U/L _1 ALT 0 - 44 U/L _2 Alk Phosphatase 38 - 126 U/L 60 58 64  Total Bilirubin 0.3 - 1.2 mg/dL 0.6 0.7 1.1  Bilirubin, Direct 0.0 -  0.2 mg/dL - - -     Assessment:  #1.  Small bowel obstruction.  She is status post exploratory laparotomy with section of segment of small bowel and she also had wedge biopsy of a small liver nodule.  Path is pending.  Bowel function appears to be recovering rapidly.  #2.  History of colon carcinoma.  She is status post open right hemicolectomy in September 2020.  No evidence of residual disease.  She has mildly elevated but stable CEA level.  Preoperative is 12 and hours around 11.  #3.  Anemia.  She had heme positive stool on admission but no overt GI bleed.  She has received single dose of vitamin B12 last week.  Hemoglobin is gradually creeping up.  #5.  Chronic kidney disease.  Some rise in serum creatinine since surgery.  Labs to be repeated in the morning.  Recommendations  For repeat lab in a.m. Await results of path.

## 2019-03-29 ENCOUNTER — Ambulatory Visit: Payer: Medicare Other | Admitting: Nutrition

## 2019-03-29 ENCOUNTER — Telehealth: Payer: Self-pay | Admitting: Nutrition

## 2019-03-29 ENCOUNTER — Ambulatory Visit (HOSPITAL_COMMUNITY): Payer: BLUE CROSS/BLUE SHIELD | Admitting: Hematology

## 2019-03-29 ENCOUNTER — Inpatient Hospital Stay: Payer: Self-pay

## 2019-03-29 LAB — CBC
HCT: 22.9 % — ABNORMAL LOW (ref 36.0–46.0)
Hemoglobin: 6.9 g/dL — CL (ref 12.0–15.0)
MCH: 29.7 pg (ref 26.0–34.0)
MCHC: 30.1 g/dL (ref 30.0–36.0)
MCV: 98.7 fL (ref 80.0–100.0)
Platelets: 233 10*3/uL (ref 150–400)
RBC: 2.32 MIL/uL — ABNORMAL LOW (ref 3.87–5.11)
RDW: 15.7 % — ABNORMAL HIGH (ref 11.5–15.5)
WBC: 12.6 10*3/uL — ABNORMAL HIGH (ref 4.0–10.5)
nRBC: 0 % (ref 0.0–0.2)

## 2019-03-29 LAB — BASIC METABOLIC PANEL
Anion gap: 7 (ref 5–15)
BUN: 40 mg/dL — ABNORMAL HIGH (ref 8–23)
CO2: 18 mmol/L — ABNORMAL LOW (ref 22–32)
Calcium: 6.7 mg/dL — ABNORMAL LOW (ref 8.9–10.3)
Chloride: 113 mmol/L — ABNORMAL HIGH (ref 98–111)
Creatinine, Ser: 2.38 mg/dL — ABNORMAL HIGH (ref 0.44–1.00)
GFR calc Af Amer: 20 mL/min — ABNORMAL LOW (ref >60)
GFR calc non Af Amer: 18 mL/min — ABNORMAL LOW (ref >60)
Glucose, Bld: 112 mg/dL — ABNORMAL HIGH (ref 70–99)
Potassium: 3.9 mmol/L (ref 3.5–5.1)
Sodium: 138 mmol/L (ref 135–145)

## 2019-03-29 LAB — MAGNESIUM: Magnesium: 1.7 mg/dL (ref 1.7–2.4)

## 2019-03-29 LAB — TYPE AND SCREEN: ABO/RH(D): A POS

## 2019-03-29 LAB — PHOSPHORUS: Phosphorus: 3.3 mg/dL (ref 2.5–4.6)

## 2019-03-29 LAB — PREPARE RBC (CROSSMATCH)

## 2019-03-29 MED ORDER — SODIUM CHLORIDE 0.9% IV SOLUTION: Freq: Once | INTRAVENOUS | Status: DC

## 2019-03-29 MED ORDER — SODIUM CHLORIDE 0.9% FLUSH
10.0000 mL | Freq: Two times a day (BID) | INTRAVENOUS | Status: DC
  Administered 2019-03-30 – 2019-04-03 (×7): 10 mL

## 2019-03-29 MED ORDER — SODIUM CHLORIDE 0.9% FLUSH: 10.0000 mL | INTRAVENOUS | Status: DC | PRN

## 2019-03-29 NOTE — Progress Notes (Signed)
CRITICAL VALUE ALERT  Critical Value:  Hgb 6.9  Date & Time Notied:  03/29/2019 @ 0756  Provider Notified: Arnoldo Morale   Orders Received/Actions taken: Awaiting order from doctor.

## 2019-03-29 NOTE — Telephone Encounter (Signed)
NO answer for phone visit. NO VM available.

## 2019-03-29 NOTE — Progress Notes (Signed)
Peripherally Inserted Central Catheter/Midline Placement  The IV Nurse has discussed with the patient and/or persons authorized to consent for the patient, the purpose of this procedure and the potential benefits and risks involved with this procedure.  The benefits include less needle sticks, lab draws from the catheter, and the patient may be discharged home with the catheter. Risks include, but not limited to, infection, bleeding, blood clot (thrombus formation), and puncture of an artery; nerve damage and irregular heartbeat and possibility to perform a PICC exchange if needed/ordered by physician.  Alternatives to this procedure were also discussed.  Bard Power PICC patient education guide, fact sheet on infection prevention and patient information card has been provided to patient /or left at bedside.    PICC/Midline Placement Documentation  PICC Single Lumen 03/29/19 PICC Right Brachial 33 cm 0 cm (Active)  Indication for Insertion or Continuance of Line Poor Vasculature-patient has had multiple peripheral attempts or PIVs lasting less than 24 hours 03/29/19 1655  Exposed Catheter (cm) 0 cm 03/29/19 1655  Site Assessment Clean;Dry;Intact 03/29/19 1655  Line Status Flushed;Saline locked;Blood return noted 03/29/19 1655  Dressing Type Transparent;Gauze;Occlusive 03/29/19 1655  Dressing Status Clean;Dry;Intact;Antimicrobial disc in place 03/29/19 1655  Dressing Change Due 03/31/19 03/29/19 1655       Gordan Payment 03/29/2019, 4:57 PM

## 2019-03-29 NOTE — Progress Notes (Signed)
Subjective:  Patient complains of feeling weak.  She denies chest pain or shortness of breath.  She says abdominal pain is less than it was yesterday.  It is primarily across lower abdomen.  She states she is passing flatus.  Patient states she ate 50% of her lunch.  Current Medications:  Current Facility-Administered Medications:  .  0.9 %  sodium chloride infusion (Manually program via Guardrails IV Fluids), , Intravenous, Once, Aviva Signs, MD .  0.9 %  sodium chloride infusion, , Intravenous, Continuous, Aviva Signs, MD, Last Rate: 100 mL/hr at 03/29/19 0300, Rate Verify at 03/29/19 0300 .  acetaminophen (TYLENOL) tablet 650 mg, 650 mg, Oral, Q6H PRN **OR** acetaminophen (TYLENOL) suppository 650 mg, 650 mg, Rectal, Q6H PRN, Aviva Signs, MD .  albuterol (PROVENTIL) (2.5 MG/3ML) 0.083% nebulizer solution 2.5 mg, 2.5 mg, Nebulization, Q4H PRN, Aviva Signs, MD .  alum & mag hydroxide-simeth (MAALOX/MYLANTA) 200-200-20 MG/5ML suspension 15 mL, 15 mL, Oral, Q4H PRN, Aviva Signs, MD .  amLODipine (NORVASC) tablet 5 mg, 5 mg, Oral, Daily, Aviva Signs, MD, 5 mg at 03/29/19 1004 .  Chlorhexidine Gluconate Cloth 2 % PADS 6 each, 6 each, Topical, Daily, Aviva Signs, MD, 6 each at 03/28/19 1600 .  diphenhydrAMINE (BENADRYL) 12.5 MG/5ML elixir 12.5 mg, 12.5 mg, Oral, Q6H PRN **OR** diphenhydrAMINE (BENADRYL) injection 12.5 mg, 12.5 mg, Intravenous, Q6H PRN, Aviva Signs, MD .  fentaNYL (SUBLIMAZE) injection 12.5 mcg, 12.5 mcg, Intravenous, Q2H PRN, Aviva Signs, MD, 12.5 mcg at 03/29/19 0039 .  LORazepam (ATIVAN) injection 0.5 mg, 0.5 mg, Intravenous, Q4H PRN, Aviva Signs, MD, 0.5 mg at 03/29/19 0405 .  ondansetron (ZOFRAN-ODT) disintegrating tablet 4 mg, 4 mg, Oral, Q6H PRN **OR** ondansetron (ZOFRAN) injection 4 mg, 4 mg, Intravenous, Q6H PRN, Aviva Signs, MD, 4 mg at 03/27/19 1215 .  pantoprazole (PROTONIX) EC tablet 40 mg, 40 mg, Oral, Q1200, Aviva Signs, MD, 40 mg at 03/29/19  1248 .  simethicone (MYLICON) chewable tablet 40 mg, 40 mg, Oral, Q6H PRN, Aviva Signs, MD .  traMADol Veatrice Bourbon) tablet 50 mg, 50 mg, Oral, Q6H PRN, Aviva Signs, MD   Objective: Blood pressure (!) 114/52, pulse 77, temperature 97.7 F (36.5 C), resp. rate 16, height '5\' 4"'$  (1.626 m), weight 55.8 kg, SpO2 100 %. Patient is sitting in a recliner. She appears pale. Cardiac exam with regular rhythm normal S1 and S2.  She has grade 2/6 systolic murmur best heard at aortic area. Auscultation lungs reveal vesicular breath sounds bilaterally. Abdomen appears to be less distended than yesterday.  Bowel sounds are present.  Midline surgical incision.  She has mild tenderness across lower abdomen.   Labs/studies Results:  CBC Latest Ref Rng & Units 03/29/2019 03/28/2019 03/26/2019  WBC 4.0 - 10.5 K/uL 12.6(H) 13.1(H) 6.8  Hemoglobin 12.0 - 15.0 g/dL 6.9(LL) 8.7(L) 8.5(L)  Hematocrit 36.0 - 46.0 % 22.9(L) 29.3(L) 29.0(L)  Platelets 150 - 400 K/uL 233 235 191    CMP Latest Ref Rng & Units 03/29/2019 03/28/2019 03/26/2019  Glucose 70 - 99 mg/dL 112(H) 94 99  BUN 8 - 23 mg/dL 40(H) 32(H) 27(H)  Creatinine 0.44 - 1.00 mg/dL 2.38(H) 1.99(H) 1.69(H)  Sodium 135 - 145 mmol/L 138 140 139  Potassium 3.5 - 5.1 mmol/L 3.9 4.7 3.9  Chloride 98 - 111 mmol/L 113(H) 111 107  CO2 22 - 32 mmol/L 18(L) 20(L) 24  Calcium 8.9 - 10.3 mg/dL 6.7(L) 7.3(L) 7.9(L)  Total Protein 6.5 - 8.1 g/dL - - -  Total Bilirubin 0.3 -  1.2 mg/dL - - -  Alkaline Phos 38 - 126 U/L - - -  AST 15 - 41 U/L - - -  ALT 0 - 44 U/L - - -    Hepatic Function Latest Ref Rng & Units 03/22/2019 03/21/2019 03/18/2019  Total Protein 6.5 - 8.1 g/dL 4.5(L) 4.5(L) 5.3(L)  Albumin 3.5 - 5.0 g/dL 2.3(L) 2.3(L) 2.7(L)  AST 15 - 41 U/L '26 25 18  '$ ALT 0 - 44 U/L '23 23 21  '$ Alk Phosphatase 38 - 126 U/L 60 58 64  Total Bilirubin 0.3 - 1.2 mg/dL 0.6 0.7 1.1  Bilirubin, Direct 0.0 - 0.2 mg/dL - - -    Path results discussed with Dr. Arnoldo Morale earlier  today.  Assessment:  #1.  Recurrent small bowel obstruction.  Patient status post resection of small bowel.  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  It appears to be due to colon primary and not urothelial.  Patient has not yet been informed of this diagnosis. Patient is tolerating soft diet.  #2.  Anemia.  Hemoglobin has dropped significantly in the last 24 hours.  She has not required transfusion during this admission.  Dr. Arnoldo Morale has 42 units of PRBCs to be transfused.  #3.  Chronic kidney disease.  Creatinine is gradually creeping up.  Recommendations  Monitor for evidence of overt GI bleed. Repeat lab in a.m.

## 2019-03-29 NOTE — Progress Notes (Signed)
2 Days Post-Op  Subjective: Patient slightly confused this morning.  She does know she had surgery.  Objective: Vital signs in last 24 hours: Temp:  [98.2 F (36.8 C)-98.4 F (36.9 C)] 98.4 F (36.9 C) (01/14 0426) Pulse Rate:  [86-103] 86 (01/14 0426) Resp:  [16-19] 16 (01/14 0426) BP: (117-137)/(52-69) 117/52 (01/14 0426) SpO2:  [95 %-98 %] 96 % (01/14 0426) Last BM Date: 03/24/19  Intake/Output from previous day: 01/13 0701 - 01/14 0700 In: 3545.4 [P.O.:600; I.V.:1945.4; IV Piggyback:1000] Out: 100 [Urine:100] Intake/Output this shift: No intake/output data recorded.  General appearance: alert, cooperative and no distress Resp: clear to auscultation bilaterally Cardio: regular rate and rhythm, S1, S2 normal, no murmur, click, rub or gallop GI: Soft but slightly distended.  Incision healing well.  Bowel sounds present.  Lab Results:  Recent Labs    03/28/19 0609 03/29/19 0646  WBC 13.1* 12.6*  HGB 8.7* 6.9*  HCT 29.3* 22.9*  PLT 235 233   BMET Recent Labs    03/28/19 0609 03/29/19 0646  NA 140 138  K 4.7 3.9  CL 111 113*  CO2 20* 18*  GLUCOSE 94 112*  BUN 32* 40*  CREATININE 1.99* 2.38*  CALCIUM 7.3* 6.7*   PT/INR No results for input(s): LABPROT, INR in the last 72 hours.  Studies/Results: [place] EKG SITE RITE  Result Date: 03/29/2019 If Site Rite image not attached, placement could not be confirmed due to current cardiac rhythm.   Anti-infectives: Anti-infectives (From admission, onward)   Start     Dose/Rate Route Frequency Ordered Stop   03/27/19 0600  ciprofloxacin (CIPRO) IVPB 400 mg     400 mg 200 mL/hr over 60 Minutes Intravenous On call to O.R. 03/26/19 1550 03/27/19 0955   03/23/19 1345  metroNIDAZOLE (FLAGYL) tablet 250 mg  Status:  Discontinued     250 mg Oral 3 times daily after meals 03/23/19 1343 03/27/19 0735      Assessment/Plan: s/p Procedure(s): PARTIAL SMALL BOWEL RESECTION LIVER BIOPSY Impression: Postoperative day 2.   Patient has had a drop in her hemoglobin as well as her urine output.  Hemoglobin is 6.9.  Will receive 2 units of packed red blood cells.  She has acute on chronic anemia.  Her renal function is slightly worse than her baseline.  Have stopped Lovenox.  Will order PICC line due to need for more IV access.  She has consented to receive blood transfusions.  Will leave Foley in due to need for strict I's and O's.  LOS: 7 days    Aviva Signs 03/29/2019

## 2019-03-30 ENCOUNTER — Ambulatory Visit: Payer: Medicare Other | Admitting: Internal Medicine

## 2019-03-30 LAB — TYPE AND SCREEN
ABO/RH(D): A POS
Antibody Screen: NEGATIVE
Unit division: 0
Unit division: 0

## 2019-03-30 LAB — CBC
HCT: 33.5 % — ABNORMAL LOW (ref 36.0–46.0)
Hemoglobin: 10.7 g/dL — ABNORMAL LOW (ref 12.0–15.0)
MCH: 30.4 pg (ref 26.0–34.0)
MCHC: 31.9 g/dL (ref 30.0–36.0)
MCV: 95.2 fL (ref 80.0–100.0)
Platelets: 194 10*3/uL (ref 150–400)
RBC: 3.52 MIL/uL — ABNORMAL LOW (ref 3.87–5.11)
RDW: 15.6 % — ABNORMAL HIGH (ref 11.5–15.5)
WBC: 11.4 10*3/uL — ABNORMAL HIGH (ref 4.0–10.5)
nRBC: 0 % (ref 0.0–0.2)

## 2019-03-30 LAB — BPAM RBC
Blood Product Expiration Date: 202102092359
Blood Product Expiration Date: 202102092359
ISSUE DATE / TIME: 202101141527
ISSUE DATE / TIME: 202101141827
Unit Type and Rh: 6200
Unit Type and Rh: 6200

## 2019-03-30 LAB — BASIC METABOLIC PANEL
Anion gap: 8 (ref 5–15)
BUN: 40 mg/dL — ABNORMAL HIGH (ref 8–23)
CO2: 16 mmol/L — ABNORMAL LOW (ref 22–32)
Calcium: 7.1 mg/dL — ABNORMAL LOW (ref 8.9–10.3)
Chloride: 112 mmol/L — ABNORMAL HIGH (ref 98–111)
Creatinine, Ser: 2.28 mg/dL — ABNORMAL HIGH (ref 0.44–1.00)
GFR calc Af Amer: 22 mL/min — ABNORMAL LOW (ref >60)
GFR calc non Af Amer: 19 mL/min — ABNORMAL LOW (ref >60)
Glucose, Bld: 85 mg/dL (ref 70–99)
Potassium: 3.4 mmol/L — ABNORMAL LOW (ref 3.5–5.1)
Sodium: 136 mmol/L (ref 135–145)

## 2019-03-30 LAB — PHOSPHORUS: Phosphorus: 3.1 mg/dL (ref 2.5–4.6)

## 2019-03-30 LAB — MAGNESIUM: Magnesium: 1.7 mg/dL (ref 1.7–2.4)

## 2019-03-30 NOTE — Care Management Important Message (Signed)
Important Message  Patient Details  Name: [name] MRN: [id number] Date of Birth: Dec 25, 1928   Medicare Important Message Given:  Yes     Tommy Medal 03/30/2019, 12:25 PM

## 2019-03-30 NOTE — NC FL2 (Signed)
[place] MEDICAID FL2 LEVEL OF CARE SCREENING TOOL     IDENTIFICATION  Patient Name: [name] Birthdate: 01-02-1929 Sex: female Admission Date (Current Location): 03/21/2019  [facility] and [place] Number:  [employer] and Address:  [place] [address] [place]      Provider Number: [id number]  Attending Physician Name and Address:  Aviva Signs, MD  Relative Name and Phone Number:  Matt Holmes [facility]: [phone]    Current Level of Care: Hospital Recommended Level of Care: [place] Prior Approval Number:    Date Approved/Denied:   PASRR Number:    Discharge Plan: SNF    Current Diagnoses: Patient Active Problem List   Diagnosis Date Noted  . Recurrent partial small bowel obstruction 03/25/2019  . Small bowel obstruction ([place]) 03/24/2019  . Goals of care, counseling/discussion   . Palliative care by specialist   . Encounter for hospice care discussion   . Anemia   . Gastrointestinal hemorrhage   . Nausea vomiting and diarrhea   . Guaiac positive stools 03/21/2019  . DNR (do not resuscitate) 03/21/2019  . SBO (small bowel obstruction) ([place]) 03/08/2019  . 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 Lower GI bleed from cecal cancer 12/10/2018  . S/P colon resection 12/08/2018  . Iron deficiency anemia due to chronic blood loss   . 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 CKD (chronic kidney disease), stage IV ([place]) 12/05/2018  . Acute anemia 12/04/2018  . Ureteral mass 05/11/2018  . Bladder cancer ([place]) 03/20/2018  . 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 Chronic respiratory failure with hypoxia ([place]) 08/13/2016  . Pulmonary nodule 07/19/2016  . Renal bruit 06/29/2016  . Dyspnea 05/04/2016  . COPD (chronic obstructive pulmonary disease) ([place]) 04/26/2016  .  Breast cancer, right breast ([place]) 05/01/2015  . Osteoarthritis of both knees 04/21/2015  . Acute bronchitis with COPD ([place]) 03/21/2015  . Abnormal CXR 01/23/2015  . Prediabetes 10/15/2014  . Osteopenia 09/18/2013  . Peripheral neuropathy 08/13/2013  . Hyperglycemia 08/13/2013  . Essential hypertension, benign 09/20/2012  . Carotid artery disease ([place]) 09/20/2012  . Hyperlipidemia 09/20/2012  . Arthritis of right knee 06/08/2007  . KNEE PAIN 06/08/2007    Orientation RESPIRATION BLADDER Height & Weight     Self, Time, Situation, Place  Normal Continent Weight: 123 lb 0.3 oz (55.8 kg) Height:  5\' 4"  (162.6 cm)  BEHAVIORAL SYMPTOMS/MOOD NEUROLOGICAL BOWEL NUTRITION STATUS      Continent    AMBULATORY STATUS COMMUNICATION OF NEEDS Skin   Extensive Assist Verbally Normal, Other (Comment)(Ecchymosis)                       Personal Care Assistance Level of Assistance  Bathing, Feeding, Dressing Bathing Assistance: Limited assistance Feeding assistance: Independent Dressing Assistance: Limited assistance     Functional Limitations Info  Sight, Hearing, Speech Sight Info: Adequate Hearing Info: Adequate Speech Info: Adequate    SPECIAL CARE FACTORS FREQUENCY  PT (By licensed PT), OT (By licensed OT)     PT Frequency: 3x weekly OT Frequency: 3x weekly            Contractures Contractures Info: Not present    Additional Factors Info  Code Status, Allergies Code Status Info: DNR Allergies Info: Augmentin (Amoxicillin-pot Clavulanate) Amoxicillin,Codeine,Morphine, Neomycin-bacitracin Zn-polymyx  Current Medications (03/30/2019):  This is the current hospital active medication list Current Facility-Administered Medications  Medication Dose Route Frequency Provider Last Rate Last Admin  . 0.9 %  sodium chloride infusion (Manually program via Guardrails IV Fluids)   Intravenous Once Aviva Signs, MD      . 0.9 %  sodium chloride infusion   Intravenous  Continuous Aviva Signs, MD 50 mL/hr at 03/30/19 0954 Rate Change at 03/30/19 0954  . acetaminophen (TYLENOL) tablet 650 mg  650 mg Oral Q6H PRN Aviva Signs, MD       Or  . acetaminophen (TYLENOL) suppository 650 mg  650 mg Rectal Q6H PRN Aviva Signs, MD      . albuterol (PROVENTIL) (2.5 MG/3ML) 0.083% nebulizer solution 2.5 mg  2.5 mg Nebulization Q4H PRN Aviva Signs, MD      . alum & mag hydroxide-simeth (MAALOX/MYLANTA) 200-200-20 MG/5ML suspension 15 mL  15 mL Oral Q4H PRN Aviva Signs, MD      . amLODipine (NORVASC) tablet 5 mg  5 mg Oral Daily Aviva Signs, MD   5 mg at 03/30/19 0900  . Chlorhexidine Gluconate Cloth 2 % PADS 6 each  6 each Topical Daily Aviva Signs, MD   6 each at 03/29/19 1038  . diphenhydrAMINE (BENADRYL) 12.5 MG/5ML elixir 12.5 mg  12.5 mg Oral Q6H PRN Aviva Signs, MD       Or  . diphenhydrAMINE (BENADRYL) injection 12.5 mg  12.5 mg Intravenous Q6H PRN Aviva Signs, MD      . fentaNYL (SUBLIMAZE) injection 12.5 mcg  12.5 mcg Intravenous Q2H PRN Aviva Signs, MD   12.5 mcg at 03/29/19 0039  . LORazepam (ATIVAN) injection 0.5 mg  0.5 mg Intravenous Q4H PRN Aviva Signs, MD   0.5 mg at 03/29/19 0405  . ondansetron (ZOFRAN-ODT) disintegrating tablet 4 mg  4 mg Oral Q6H PRN Aviva Signs, MD       Or  . ondansetron [facility]) injection 4 mg  4 mg Intravenous Q6H PRN Aviva Signs, MD   4 mg at 03/27/19 1215  . pantoprazole (PROTONIX) EC tablet 40 mg  40 mg Oral Q1200 Aviva Signs, MD   40 mg at 03/30/19 1236  . simethicone (MYLICON) chewable tablet 40 mg  40 mg Oral Q6H PRN Aviva Signs, MD      . sodium chloride flush (NS) 0.9 % injection 10-40 mL  10-40 mL Intracatheter Q12H Aviva Signs, MD      . sodium chloride flush (NS) 0.9 % injection 10-40 mL  10-40 mL Intracatheter PRN Aviva Signs, MD      . traMADol Veatrice Bourbon) tablet 50 mg  50 mg Oral Q6H PRN Aviva Signs, MD         Discharge Medications: Please see discharge summary for a list of discharge  medications.  Relevant Imaging Results:  Relevant Lab Results:   Additional Information SS# [id number]  [place], [place]

## 2019-03-30 NOTE — Plan of Care (Signed)
  Problem: Acute Rehab PT Goals(only PT should resolve) Goal: Pt Will Go Supine/Side To Sit Outcome: Progressing Flowsheets (Taken 03/30/2019 1216) Pt will go Supine/Side to Sit: with minimal assist Goal: Patient Will Transfer Sit To/From Stand Outcome: Progressing Flowsheets (Taken 03/30/2019 1216) Patient will transfer sit to/from stand: with minimal assist Goal: Pt Will Transfer Bed To Chair/Chair To Bed Outcome: Progressing Flowsheets (Taken 03/30/2019 1216) Pt will Transfer Bed to Chair/Chair to Bed: with min assist Goal: Pt Will Ambulate Outcome: Progressing Flowsheets (Taken 03/30/2019 1216) Pt will Ambulate:  75 feet  with minimal assist  with least restrictive assistive device  12:17 PM, 03/30/19 Mearl Latin PT, DPT Physical Therapist at [facility]

## 2019-03-30 NOTE — Progress Notes (Signed)
Subjective:  Patient says she feels better today.  She states she slept well last night and this morning.  She states she had a bowel movement earlier today.  According to her her nurse she had soft mushy brown stool.  No melena or rectal bleeding reported.  Patient complains of intermittent pain across lower abdomen.  She says pain is less than yesterday.  She denies nausea or vomiting.  She ate 50% of her breakfast and 25% of lunch according to nursing staff. According to nursing staff she has been confused since yesterday.   Current Medications:  Current Facility-Administered Medications:  .  0.9 %  sodium chloride infusion (Manually program via Guardrails IV Fluids), , Intravenous, Once, Aviva Signs, MD .  0.9 %  sodium chloride infusion, , Intravenous, Continuous, Aviva Signs, MD, Last Rate: 50 mL/hr at 03/30/19 0954, Rate Change at 03/30/19 0954 .  acetaminophen (TYLENOL) tablet 650 mg, 650 mg, Oral, Q6H PRN **OR** acetaminophen (TYLENOL) suppository 650 mg, 650 mg, Rectal, Q6H PRN, Aviva Signs, MD .  albuterol (PROVENTIL) (2.5 MG/3ML) 0.083% nebulizer solution 2.5 mg, 2.5 mg, Nebulization, Q4H PRN, Aviva Signs, MD .  alum & mag hydroxide-simeth (MAALOX/MYLANTA) 200-200-20 MG/5ML suspension 15 mL, 15 mL, Oral, Q4H PRN, Aviva Signs, MD .  amLODipine (NORVASC) tablet 5 mg, 5 mg, Oral, Daily, Aviva Signs, MD, 5 mg at 03/30/19 0900 .  Chlorhexidine Gluconate Cloth 2 % PADS 6 each, 6 each, Topical, Daily, Aviva Signs, MD, 6 each at 03/29/19 1038 .  diphenhydrAMINE (BENADRYL) 12.5 MG/5ML elixir 12.5 mg, 12.5 mg, Oral, Q6H PRN **OR** diphenhydrAMINE (BENADRYL) injection 12.5 mg, 12.5 mg, Intravenous, Q6H PRN, Aviva Signs, MD .  fentaNYL (SUBLIMAZE) injection 12.5 mcg, 12.5 mcg, Intravenous, Q2H PRN, Aviva Signs, MD, 12.5 mcg at 03/29/19 0039 .  LORazepam (ATIVAN) injection 0.5 mg, 0.5 mg, Intravenous, Q4H PRN, Aviva Signs, MD, 0.5 mg at 03/29/19 0405 .  ondansetron  (ZOFRAN-ODT) disintegrating tablet 4 mg, 4 mg, Oral, Q6H PRN **OR** ondansetron (ZOFRAN) injection 4 mg, 4 mg, Intravenous, Q6H PRN, Aviva Signs, MD, 4 mg at 03/27/19 1215 .  pantoprazole (PROTONIX) EC tablet 40 mg, 40 mg, Oral, Q1200, Aviva Signs, MD, 40 mg at 03/30/19 1236 .  simethicone (MYLICON) chewable tablet 40 mg, 40 mg, Oral, Q6H PRN, Aviva Signs, MD .  sodium chloride flush (NS) 0.9 % injection 10-40 mL, 10-40 mL, Intracatheter, Q12H, Aviva Signs, MD .  sodium chloride flush (NS) 0.9 % injection 10-40 mL, 10-40 mL, Intracatheter, PRN, Aviva Signs, MD .  traMADol Veatrice Bourbon) tablet 50 mg, 50 mg, Oral, Q6H PRN, Aviva Signs, MD   Objective: Blood pressure (!) 142/73, pulse 97, temperature 97.9 F (36.6 C), temperature source Oral, resp. rate 20, height _0  (1.626 m), weight 55.8 kg, SpO2 95 %. Patient is sitting in a recliner. Patient is awake and responds appropriate to questions Her abdomen remains mildly distended but bowel sounds are present.  On palpation it is soft.  She had tenderness noted on around surgical incision. She does not have peripheral edema.  Labs/studies Results:   CBC Latest Ref Rng & Units 03/30/2019 03/29/2019 03/28/2019  WBC 4.0 - 10.5 K/uL 11.4(H) 12.6(H) 13.1(H)  Hemoglobin 12.0 - 15.0 g/dL 10.7(L) 6.9(LL) 8.7(L)  Hematocrit 36.0 - 46.0 % 33.5(L) 22.9(L) 29.3(L)  Platelets 150 - 400 K/uL 194 233 235    CMP Latest Ref Rng & Units 03/30/2019 03/29/2019 03/28/2019  Glucose 70 - 99 mg/dL 85 112(H) 94  BUN 8 - 23 mg/dL 40(H) 40(H)  32(H)  Creatinine 0.44 - 1.00 mg/dL 2.28(H) 2.38(H) 1.99(H)  Sodium 135 - 145 mmol/L 136 138 140  Potassium 3.5 - 5.1 mmol/L 3.4(L) 3.9 4.7  Chloride 98 - 111 mmol/L 112(H) 113(H) 111  CO2 22 - 32 mmol/L 16(L) 18(L) 20(L)  Calcium 8.9 - 10.3 mg/dL 7.1(L) 6.7(L) 7.3(L)  Total Protein 6.5 - 8.1 g/dL - - -  Total Bilirubin 0.3 - 1.2 mg/dL - - -  Alkaline Phos 38 - 126 U/L - - -  AST 15 - 41 U/L - - -  ALT 0 - 44 U/L - - -     Hepatic Function Latest Ref Rng & Units 03/22/2019 03/21/2019 03/18/2019  Total Protein 6.5 - 8.1 g/dL 4.5(L) 4.5(L) 5.3(L)  Albumin 3.5 - 5.0 g/dL 2.3(L) 2.3(L) 2.7(L)  AST 15 - 41 U/L _0 ALT 0 - 44 U/L _1 Alk Phosphatase 38 - 126 U/L 60 58 64  Total Bilirubin 0.3 - 1.2 mg/dL 0.6 0.7 1.1  Bilirubin, Direct 0.0 - 0.2 mg/dL - - -      Assessment:  #1.  Small bowel obstruction.  She is status post a laparotomy with resection of segment of small bowel and omentum 3 days ago.  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  She is tolerating oral feeding but intake still not adequate. Patient has not yet been informed about this diagnosis.  I did talk with her nephew [name] and brought him up-to-date about her condition and path findings.  #2.  Anemia.  Patient received 2 units of PRBCs yesterday.  Her stool was guaiac positive on admission.  No evidence of gross GI bleed.  Hemoglobin is now 10.7  #3.  Chronic kidney disease.  Renal function is improved somewhat since yesterday.  #4.  Confusion.  Suspect hospital psychosis in an elderly patient.  She did receive a dose of fentanyl and lorazepam yesterday.  #5.  Deconditioning.  She is being evaluated by PT. she will need a lot of help before she gets to baseline  Recommendations  Continue pantoprazole at current dose of 40 mg by mouth daily   Dr. Gala Romney will be seeing patient over the weekend.

## 2019-03-30 NOTE — Progress Notes (Deleted)
[name]    [id number]    11-27-1928  Primary Care Physician:Luking, Elayne Snare, MD Date of Appointment: 03/30/2019 Established Patient Visit  Chief complaint:  No chief complaint on file.    HPI: Former patient of Dr. Anastasia Pall for COPD. Here to re-establish care.   Interval Updates: ***  I have reviewed the patient's family social and past medical history and updated as appropriate.   Past Medical History:  Diagnosis Date  . Breast cancer ([place])   . Breast cancer, right breast ([place]) 05/01/2015  . Cancer ([facility])    BREAST CANCER   . Colon polyps   . COPD (chronic obstructive pulmonary disease) ([place])    DR. [place]  . Diabetes mellitus without complication ([place])    PREDIABETIC  CONTROL W/ DIET  . Hypercholesteremia   . Hypertension   . Lung nodules   . Osteoarthritis   . Osteopenia 09/18/2013  . Osteoporosis   . Personal history of radiation therapy   . PONV (postoperative nausea and vomiting)    ' i get so sick with anesthesia"   . Shortness of breath dyspnea    W/ EXERTION  . Tick bite    3 years ago     Past Surgical History:  Procedure Laterality Date  . ABDOMINAL HYSTERECTOMY    . APPENDECTOMY    . BARTHOLIN GLAND CYST EXCISION    . BIOPSY  12/06/2018   Procedure: BIOPSY;  Surgeon: Rogene Houston, MD;  [facility];  Service: Endoscopy;;  cecal mass biopsies   . BOWEL RESECTION N/A 03/27/2019   Procedure: PARTIAL SMALL BOWEL RESECTION;  Surgeon: Aviva Signs, MD;  Location: [facility];  Service: General;  Laterality: N/A;  . BREAST EXCISIONAL BIOPSY Left    benign  . BREAST LUMPECTOMY WITH RADIOACTIVE SEED LOCALIZATION Right 05/23/2015   Procedure: RIGHT BREAST LUMPECTOMY WITH RADIOACTIVE SEED LOCALIZATION;  Surgeon: Fanny Skates, MD;  Location: [place];  Service: General;  Laterality: Right;  . BREAST SURGERY     2000  LEFT BREAST  LUMP REMOVED  . cataract surgeries  Bilateral   . COLON RESECTION N/A 12/08/2018   Procedure: OPEN  SEGMENTAL COLON RESECTION;  Surgeon: Fanny Skates, MD;  Location: [facility];  Service: General;  Laterality: N/A;  . colonoscopy    . COLONOSCOPY  03/25/2011   Procedure: COLONOSCOPY;  Surgeon: Rogene Houston, MD;  [facility];  Service: Endoscopy;  Laterality: N/A;  9:30 am  . COLONOSCOPY N/A 12/06/2018   Procedure: COLONOSCOPY;  Surgeon: Rogene Houston, MD;  [facility];  Service: Endoscopy;  Laterality: N/A;  . CYSTOSCOPY W/ URETERAL STENT PLACEMENT Left 02/27/2018   Procedure: CYSTOSCOPY WITH LEFT RETROGRADE PYELOGRAM AND lEFT URETEROSCOPY WITH BIOPSY AND LEFT URETERAL STENT PLACEMENT;  Surgeon: Cleon Gustin, MD;  Location[facility];  Service: Urology;  Laterality: Left;  . CYSTOSCOPY W/ URETERAL STENT PLACEMENT Right 12/08/2018   Procedure: CYSTOSCOPY WITH RETROGRADE PYELOGRAM/URETERAL STENT PLACEMENT;  Surgeon: Raynelle Bring, MD;  [facility];  Service: Urology;  Laterality: Right;  . left inguinal hernia repair    . LIVER BIOPSY N/A 03/27/2019   Procedure: LIVER BIOPSY;  Surgeon: Aviva Signs, MD;  Location[facility];  Service: General;  Laterality: N/A;  . multiple toe surgeries    . ROBOT ASSITED LAPAROSCOPIC NEPHROURETERECTOMY Left 05/11/2018   Procedure: XI ROBOT ASSITED LAPAROSCOPIC LEFT DISTAL URETERECTOMY, URETERAL  REIMPLANTATION, WITH PSOAS HITCH AND BOARI FLAP.  PELVIC LYMPH NODE DISSCETION AND LEFT URETERAL STENT PLACEMENT;  Surgeon: Cleon Gustin, MD;  Location[facility];  Service: Urology;  Laterality: Left;  . TONSILLECTOMY      Family History  Problem Relation Age of Onset  . Colon cancer Father     Social History   Occupational History  . Not on file  Tobacco Use  . Smoking status: Former Smoker    Packs/day: 2.00    Years: 50.00    Pack years: 100.00    Types: Cigarettes    Quit date: 03/15/1988    Years since quitting: 31.0  . Smokeless tobacco: Never Used  Substance and Sexual Activity  . Alcohol use: Yes     Alcohol/week: 0.0 standard drinks    Comment: seldom   . Drug use: No  . Sexual activity: Not on file    Review of systems: Constitutional: ***No fevers, chills, night sweats, or weight loss. CV: ***No chest pain, or palpitations. Resp: ***No hemoptysis.  Physical Exam: There were no vitals taken for this visit.  Gen:      No acute distress Eyes: EOMI, sclera anicteric ENT:  ***no nasal polyps, mucus membranes moist Neck:     Supple, no thyromegaly Lungs:    No increased respiratory effort, c symmetric chest wall excursion, lear to auscultation bilaterally, ***no wheezes or crackles CV:         Regular rate and rhythm; no murmurs, rubs, or gallops.  No pedal edema Abd:      + bowel sounds; soft, non-tender; no distension MSK: no acute synovitis of DIP or PIP joints, no mechanics hands.  Skin:      Warm and dry; no rashes*** Neuro: normal speech, no focal facial asymmetry Psych: alert and oriented x3, normal mood and affect  Data Reviewed: Imaging: I have personally reviewed the ***  PFTs: PFT Results Latest Ref Rng & Units 08/13/2016  FVC-Pre L 1.78  FVC-Predicted Pre % 82  FVC-Post L 1.86  FVC-Predicted Post % 85  Pre FEV1/FVC % % 67  Post FEV1/FCV % % 67  FEV1-Pre L 1.19  FEV1-Predicted Pre % 74  FEV1-Post L 1.25  DLCO UNC% % 58  DLCO COR %Predicted % 78  TLC L 4.66  TLC % Predicted % 96  RV % Predicted % 109   I have personally reviewed the patient's PFTs and ***  Labs:  Immunization status: Immunization History  Administered Date(s) Administered  . Fluad Quad(high Dose 65+) 11/23/2018  . Influenza Split 01/30/2013  . Influenza,inj,Quad PF,6+ Mos 12/12/2013, 01/02/2015, 12/16/2015, 12/28/2016, 12/13/2017  . Influenza-Unspecified 12/14/2011, 01/13/2012  . Pneumococcal Conjugate-13 09/18/2013  . Pneumococcal Polysaccharide-23 01/07/2010  . Tdap 12/14/2010  . Zoster 03/16/2011  . Zoster Recombinat (Shingrix) 10/28/2016, 02/04/2017   {immuniz  status:315306::"up to date and documented"}.{immuniz status:315306::"up to date and documented"}.  Assessment:  ***  Plan/Recommendations: ***   I spent *** minutes on 03/30/2019 in care of this patient including face to face time and non-face to face time spent charting, review of outside records, and coordination of care. 32202 10-19 minutes or Straightforward MDM 54270 20-29 minutes or Low level MDM 62376 30-39 minutes or Moderate level MDM 28315  40-54 minutes or High level MDM    Return to Care: No follow-ups on file.   Lenice Llamas, MD Pulmonary and [place]

## 2019-03-30 NOTE — Evaluation (Signed)
Physical Therapy Evaluation Patient Details Name: [name] MRN: [id number] DOB: [birth year] Today's Date: 03/30/2019   History of Present Illness  [name] is a 86 y.o. female with medical history significant of cecal adenocarcinoma, hypertension, urothelial carcinoma, breast cancer, COPD, prediabetes who presented to the ER with abdominal pain.  Patient was recently admitted here with a partial small bowel obstruction from 03/08/2019-03/12/2019.  She was discharged after tolerating oral feeds and having multiple bowel movements.  She states she has had recurrent symptoms in the last 24 hours with increased abdominal pain, distention, nausea, reduced appetite.  Unable to tolerate anything p.o. in the last 24 hours.  Feels really weak and dehydrated.  Contacted her PCP who recommended coming back to the ER.  Lives alone and is usually independent at baseline.  Currently having difficulty caring for herself because of weakness.    Clinical Impression  Patient limited for functional mobility as stated below secondary to BLE weakness, fatigue and poor sitting balance. Patient lethargic throughout today's session and does not appear to be at baseline. She is able to complete exercises in supine with frequent verbal cueing. She requires mod assist and verbal and tactile cueing to transition to seated EOB. Upon seated, she requires assist to remain seated due to impaired postural control. Patient fatigued by that point and requires max assist to return to supine. Patient will benefit from continued physical therapy in hospital and recommended venue below to increase strength, balance, endurance for safe ADLs and gait.     Follow Up Recommendations SNF    Equipment Recommendations  None recommended by PT    Recommendations for Other Services       Precautions / Restrictions Precautions Precautions: Fall Restrictions Weight Bearing Restrictions: No      Mobility  Bed  Mobility Overal bed mobility: Needs Assistance Bed Mobility: Supine to Sit;Sit to Supine     Supine to sit: Mod assist Sit to supine: Max assist   General bed mobility comments: slow, labored, uses bed rail, requires frequent verbal cueing and assist for LE movement and uprighting trunk  Transfers                    Ambulation/Gait                Stairs            Wheelchair Mobility    Modified Rankin (Stroke Patients Only)       Balance Overall balance assessment: Needs assistance Sitting-balance support: Feet supported;Bilateral upper extremity supported Sitting balance-Leahy Scale: Poor Sitting balance - Comments: patient requires mod assist from therapist to remain seated, frequent verbal cueing to use UE for support with limited carry over, Patient with posterior lean and impaired postural control Postural control: Posterior lean                                   Pertinent Vitals/Pain Pain Assessment: No/denies pain    Home Living Family/patient expects to be discharged to:: Private residence Living Arrangements: Alone Available Help at Discharge: Friend(s);Family;Available PRN/intermittently Type of Home: House Home Access: Stairs to enter Entrance Stairs-Rails: Right;Left;Can reach both Entrance Stairs-Number of Steps: 5 Home Layout: One level Home Equipment: Cane - single point;Walker - 2 wheels      Prior Function Level of Independence: Independent with assistive device(s)         Comments: Household and short distanced  community ambulator with RW     Hand Dominance   Dominant Hand: Right    Extremity/Trunk Assessment   Upper Extremity Assessment Upper Extremity Assessment: Generalized weakness    Lower Extremity Assessment Lower Extremity Assessment: Generalized weakness    Cervical / Trunk Assessment Cervical / Trunk Assessment: Kyphotic  Communication   Communication: No difficulties  Cognition  Arousal/Alertness: Lethargic Behavior During Therapy: Flat affect Overall Cognitive Status: Impaired/Different from baseline Area of Impairment: Attention                               General Comments: Patient lethargic today with decreased levels of attention and focus      General Comments      Exercises General Exercises - Lower Extremity Ankle Circles/Pumps: AROM;Both;10 reps;Supine Heel Slides: AROM;Both;10 reps;Supine Hip Flexion/Marching: AAROM;Both;10 reps;Seated   Assessment/Plan    PT Assessment Patient needs continued PT services  PT Problem List Decreased strength;Decreased activity tolerance;Decreased balance;Decreased mobility       PT Treatment Interventions Gait training;Balance training;Stair training;Functional mobility training;Therapeutic activities;Patient/family education;DME instruction;Therapeutic exercise;Neuromuscular re-education    PT Goals (Current goals can be found in the Care Plan section)  Acute Rehab PT Goals Patient Stated Goal: return home with family/friends to assist PT Goal Formulation: With patient Time For Goal Achievement: 04/13/19 Potential to Achieve Goals: Fair    Frequency Min 3X/week   Barriers to discharge        Co-evaluation               AM-PAC PT "6 Clicks" Mobility  Outcome Measure Help needed turning from your back to your side while in a flat bed without using bedrails?: A Little Help needed moving from lying on your back to sitting on the side of a flat bed without using bedrails?: A Little Help needed moving to and from a bed to a chair (including a wheelchair)?: A Lot Help needed standing up from a chair using your arms (e.g., wheelchair or bedside chair)?: A Lot Help needed to walk in hospital room?: A Lot Help needed climbing 3-5 steps with a railing? : Total 6 Click Score: 13    End of Session   Activity Tolerance: Patient tolerated treatment well;Patient limited by lethargy Patient  left: in bed;with call bell/phone within reach;with bed alarm set Nurse Communication: Mobility status PT Visit Diagnosis: Other abnormalities of gait and mobility (R26.89);Muscle weakness (generalized) (M62.81);Unsteadiness on feet (R26.81)    Time: 4076-8088 PT Time Calculation (min) (ACUTE ONLY): 20 min   Charges:   PT Evaluation $PT Eval Moderate Complexity: 1 Mod PT Treatments $Therapeutic Activity: 8-22 mins        12:15 PM, 03/30/19 Mearl Latin PT, DPT Physical Therapist at [facility]

## 2019-03-30 NOTE — Progress Notes (Addendum)
Foley catheter removed at 1015 this morning and purwick placed.  Small amount of urine in canister and mushy bm just now with lots of liquid covering sheet  Unable to tell if liquid stool or urine .  Bladder scan showed 243.  Contacted Dr. Constance Haw and she ordered I&O cath.  Cath yielded 200 mls clear yellow urine.  She voided a little before being catherized. Dressing to abd dry and intact with old blood.

## 2019-03-30 NOTE — Progress Notes (Signed)
3 Days Post-Op  Subjective: Patient more alert this morning.  Denies any abdominal pain or shortness of breath.  Is willing to be placed in the skilled nursing facility short-term for rehab.  Objective: Vital signs in last 24 hours: Temp:  [97.7 F (36.5 C)-98.7 F (37.1 C)] 97.9 F (36.6 C) (01/15 0504) Pulse Rate:  [76-97] 97 (01/15 0504) Resp:  [16-20] 20 (01/14 2144) BP: (114-142)/(52-73) 142/73 (01/15 0504) SpO2:  [94 %-100 %] 95 % (01/15 0504) Last BM Date: 03/28/19  Intake/Output from previous day: 01/14 0701 - 01/15 0700 In: 1147.5 [P.O.:240; Blood:907.5] Out: 900 [Urine:900] Intake/Output this shift: No intake/output data recorded.  General appearance: alert, cooperative and no distress Resp: clear to auscultation bilaterally Cardio: regular rate and rhythm, S1, S2 normal, no murmur, click, rub or gallop GI: Soft, bowel sounds active.  Incision healing well.  Lab Results:  Recent Labs    03/29/19 0646 03/30/19 0007  WBC 12.6* 11.4*  HGB 6.9* 10.7*  HCT 22.9* 33.5*  PLT 233 194   BMET Recent Labs    03/29/19 0646 03/30/19 0007  NA 138 136  K 3.9 3.4*  CL 113* 112*  CO2 18* 16*  GLUCOSE 112* 85  BUN 40* 40*  CREATININE 2.38* 2.28*  CALCIUM 6.7* 7.1*   PT/INR No results for input(s): LABPROT, INR in the last 72 hours.  Studies/Results: [place] EKG SITE RITE  Result Date: 03/29/2019 If Site Rite image not attached, placement could not be confirmed due to current cardiac rhythm.   Anti-infectives: Anti-infectives (From admission, onward)   Start     Dose/Rate Route Frequency Ordered Stop   03/27/19 0600  ciprofloxacin (CIPRO) IVPB 400 mg     400 mg 200 mL/hr over 60 Minutes Intravenous On call to O.R. 03/26/19 1550 03/27/19 0955   03/23/19 1345  metroNIDAZOLE (FLAGYL) tablet 250 mg  Status:  Discontinued     250 mg Oral 3 times daily after meals 03/23/19 1343 03/27/19 0735      Assessment/Plan: s/p Procedure(s): PARTIAL SMALL BOWEL  RESECTION LIVER BIOPSY Impression: Hemoglobin has normalized.  Patient's urine output has significantly increased.  Renal function has plateaued.  Hopefully this will improve with her increased urine output.  I did tell the patient that she did have recurrent metastatic colon cancer that involve the small bowel.  She is deconditioned and I do recommend skilled nursing facility placement upon discharge to help out with recovery.  She has accepted this.  Will remove Foley catheter.  Continue to monitor electrolytes and hemoglobin.  LOS: 8 days    Aviva Signs 03/30/2019

## 2019-03-31 DIAGNOSIS — R195 Other fecal abnormalities: Secondary | ICD-10-CM

## 2019-03-31 LAB — BASIC METABOLIC PANEL
Anion gap: 9 (ref 5–15)
BUN: 38 mg/dL — ABNORMAL HIGH (ref 8–23)
CO2: 17 mmol/L — ABNORMAL LOW (ref 22–32)
Calcium: 7.5 mg/dL — ABNORMAL LOW (ref 8.9–10.3)
Chloride: 116 mmol/L — ABNORMAL HIGH (ref 98–111)
Creatinine, Ser: 2.22 mg/dL — ABNORMAL HIGH (ref 0.44–1.00)
GFR calc Af Amer: 22 mL/min — ABNORMAL LOW (ref >60)
GFR calc non Af Amer: 19 mL/min — ABNORMAL LOW (ref >60)
Glucose, Bld: 79 mg/dL (ref 70–99)
Potassium: 3.6 mmol/L (ref 3.5–5.1)
Sodium: 142 mmol/L (ref 135–145)

## 2019-03-31 LAB — CBC
HCT: 35.8 % — ABNORMAL LOW (ref 36.0–46.0)
Hemoglobin: 11.2 g/dL — ABNORMAL LOW (ref 12.0–15.0)
MCH: 29.6 pg (ref 26.0–34.0)
MCHC: 31.3 g/dL (ref 30.0–36.0)
MCV: 94.7 fL (ref 80.0–100.0)
Platelets: 148 10*3/uL — ABNORMAL LOW (ref 150–400)
RBC: 3.78 MIL/uL — ABNORMAL LOW (ref 3.87–5.11)
RDW: 16.2 % — ABNORMAL HIGH (ref 11.5–15.5)
WBC: 9.3 10*3/uL (ref 4.0–10.5)
nRBC: 0 % (ref 0.0–0.2)

## 2019-03-31 MED ORDER — HEPARIN SODIUM (PORCINE) 5000 UNIT/ML IJ SOLN
5000.0000 [IU] | Freq: Three times a day (TID) | INTRAMUSCULAR | Status: DC
  Administered 2019-03-31 – 2019-04-03 (×10): 5000 [IU] via SUBCUTANEOUS
  Filled 2019-03-31 (×10): qty 1

## 2019-03-31 NOTE — Progress Notes (Signed)
[facility] Progress Note  4 Days Post-Op  Subjective: Doing fair. Had some retention and was I&O once after foley removed. Now having output with purwick in place. Overall no complaints. Says she is eating and drinking, plate looks like 83%. RN reports getting patient more water and tea. Creatinine remains elevated but relatively stable. Urine in canister.   Objective: Vital signs in last 24 hours: Temp:  [98.3 F (36.8 C)-99.7 F (37.6 C)] 99.1 F (37.3 C) (01/16 0624) Pulse Rate:  [83-103] 103 (01/16 0624) Resp:  [18-20] 18 (01/16 0624) BP: (119-146)/(64-70) 146/64 (01/16 0624) SpO2:  [94 %-100 %] 97 % (01/16 1120) Last BM Date: 03/30/19  Intake/Output from previous day: 01/15 0701 - 01/16 0700 In: 480 [P.O.:480] Out: 250 [Urine:250] Intake/Output this shift: Total I/O In: 240 [P.O.:240] Out: -   General appearance: alert, cooperative and no distress Resp: normal work of breathing GI: soft, nondistended, appropriately tender, honeycomb with betadine staining no erythema or drainage  Lab Results:  Recent Labs    03/30/19 0007 03/31/19 0753  WBC 11.4* 9.3  HGB 10.7* 11.2*  HCT 33.5* 35.8*  PLT 194 148*   BMET Recent Labs    03/30/19 0007 03/31/19 0753  NA 136 142  K 3.4* 3.6  CL 112* 116*  CO2 16* 17*  GLUCOSE 85 79  BUN 40* 38*  CREATININE 2.28* 2.22*  CALCIUM 7.1* 7.5*    Anti-infectives: Anti-infectives (From admission, onward)   Start     Dose/Rate Route Frequency Ordered Stop   03/27/19 0600  ciprofloxacin (CIPRO) IVPB 400 mg     400 mg 200 mL/hr over 60 Minutes Intravenous On call to O.R. 03/26/19 1550 03/27/19 0955   03/23/19 1345  metroNIDAZOLE (FLAGYL) tablet 250 mg  Status:  Discontinued     250 mg Oral 3 times daily after meals 03/23/19 1343 03/27/19 0735      Assessment/Plan: [name] is a 86 yo POD 4 s/p Ex lap, partial small bowel resection and liver wedge biopsy for small bowel obstruction related to  metastasis from colon cancer.  Doing fair.  PRN tramadol and fentanyl for pain control, requiring minimal IS, OOB to chair and working with PT HD ok, on home norvasc  Soft diet having BM NS @ 50, intake ok but Cr remains elevated and BUN elevated, does take some lasix PRN but do not see that she is really fluid overloaded at this time, monitor  Repeat BMP in the AM  H&H stabilizing out after 2U pRBC no signs of bleeding, platelets drifting down, monitor, no leukocytosis  SCDs, given cancer and minimal ambulation, will add back heparin sq as she is well over her baseline Hgb of about 8 and there are not signs of bleeding  Will repeat CBC in the AM to ensure H&H and platelets remain stable  Case workers looking for SNF placement, no facility found yet, will need COVID repeated and they will let me know    LOS: 9 days    [name] 03/31/2019

## 2019-03-31 NOTE — Progress Notes (Signed)
She has no complaints.  Wants to go home. Seems to be tolerating advancement of her diet so far.  Vital signs in last 24 hours: Temp:  [98.3 F (36.8 C)-99.7 F (37.6 C)] 99.1 F (37.3 C) (01/16 0624) Pulse Rate:  [83-103] 103 (01/16 0624) Resp:  [18-20] 18 (01/16 0624) BP: (119-146)/(64-70) 146/64 (01/16 0624) SpO2:  [94 %-100 %] 97 % (01/16 1120) Last BM Date: 03/30/19 General:   Frail elderly lady who appears pleasant and in no acute distress. Abdomen:   Extremities:  Without clubbing or edema.    Intake/Output from previous day: 01/15 0701 - 01/16 0700 In: 480 [P.O.:480] Out: 250 [Urine:250] Intake/Output this shift: Total I/O In: 240 [P.O.:240] Out: -   Lab Results: Recent Labs    03/29/19 0646 03/30/19 0007 03/31/19 0753  WBC 12.6* 11.4* 9.3  HGB 6.9* 10.7* 11.2*  HCT 22.9* 33.5* 35.8*  PLT 233 194 148*   BMET Recent Labs    03/29/19 0646 03/30/19 0007 03/31/19 0753  NA 138 136 142  K 3.9 3.4* 3.6  CL 113* 112* 116*  CO2 18* 16* 17*  GLUCOSE 112* 85 79  BUN 40* 40* 38*  CREATININE 2.38* 2.28* 2.22*  CALCIUM 6.7* 7.1* 7.5*    Impression: Pleasant elderly lady with recent laparotomy with resection of a segment of small bowel /omentum related to metastatic colon cancer producing a small bowel obstruction. Anemia responded to packed RBC transfusion.  No overt GI bleeding at this time. She seems to be improving postoperatively albeit somewhat slowly.  Recommendations: Continue PPI empirically and monitor for recurrent bleeding.  We will continue along with you.

## 2019-04-01 LAB — CBC
HCT: 36.3 % (ref 36.0–46.0)
Hemoglobin: 11.3 g/dL — ABNORMAL LOW (ref 12.0–15.0)
MCH: 29.8 pg (ref 26.0–34.0)
MCHC: 31.1 g/dL (ref 30.0–36.0)
MCV: 95.8 fL (ref 80.0–100.0)
Platelets: 150 10*3/uL (ref 150–400)
RBC: 3.79 MIL/uL — ABNORMAL LOW (ref 3.87–5.11)
RDW: 16.2 % — ABNORMAL HIGH (ref 11.5–15.5)
WBC: 9.3 10*3/uL (ref 4.0–10.5)
nRBC: 0 % (ref 0.0–0.2)

## 2019-04-01 LAB — BASIC METABOLIC PANEL
Anion gap: 8 (ref 5–15)
BUN: 39 mg/dL — ABNORMAL HIGH (ref 8–23)
CO2: 16 mmol/L — ABNORMAL LOW (ref 22–32)
Calcium: 7.7 mg/dL — ABNORMAL LOW (ref 8.9–10.3)
Chloride: 118 mmol/L — ABNORMAL HIGH (ref 98–111)
Creatinine, Ser: 2.02 mg/dL — ABNORMAL HIGH (ref 0.44–1.00)
GFR calc Af Amer: 25 mL/min — ABNORMAL LOW (ref >60)
GFR calc non Af Amer: 21 mL/min — ABNORMAL LOW (ref >60)
Glucose, Bld: 86 mg/dL (ref 70–99)
Potassium: 3.5 mmol/L (ref 3.5–5.1)
Sodium: 142 mmol/L (ref 135–145)

## 2019-04-01 NOTE — TOC Progression Note (Signed)
Transition of Care [facility]) - Progression Note    Patient Details  Name: [name] MRN: [id number] Date of Birth: May 20, 1928  Transition of Care [facility]) CM/SW Contact  Roda Shutters Margretta Sidle, RN Phone Number: 04/01/2019, 8:21 AM  Clinical Narrative:     CM spoke with [facility] rep who says they would tentively have 1-2 beds on Monday 1/18.

## 2019-04-01 NOTE — Progress Notes (Signed)
[facility] Progress Note  5 Days Post-Op  Subjective: Doing fair. Says she wants to get up and walk. PT not around this weekend.  Tolerating diet and having Bms. UOP up with the canister with 500 now. Cr down.   Objective: Vital signs in last 24 hours: Temp:  [97.5 F (36.4 C)-98.5 F (36.9 C)] 98.1 F (36.7 C) (01/17 0542) Pulse Rate:  [98-104] 104 (01/17 0542) Resp:  [18-19] 19 (01/17 0542) BP: (125-145)/(65-85) 145/85 (01/17 0542) SpO2:  [91 %-95 %] 93 % (01/17 1116) Last BM Date: 03/31/19  Intake/Output from previous day: 01/16 0701 - 01/17 0700 In: 720 [P.O.:720] Out: 1350 [Urine:1350] Intake/Output this shift: Total I/O In: 240 [P.O.:240] Out: -   General appearance: alert, cooperative and no distress Resp: normal work of breathing, IS pulling 750 but not consistent GI: soft, appropriate tender, midline with honeycomb dressing, betadine staining, no erythema or drainage  Lab Results:  Recent Labs    03/31/19 0753 04/01/19 0623  WBC 9.3 9.3  HGB 11.2* 11.3*  HCT 35.8* 36.3  PLT 148* 150   BMET Recent Labs    03/31/19 0753 04/01/19 0623  NA 142 142  K 3.6 3.5  CL 116* 118*  CO2 17* 16*  GLUCOSE 79 86  BUN 38* 39*  CREATININE 2.22* 2.02*  CALCIUM 7.5* 7.7*    Anti-infectives: Anti-infectives (From admission, onward)   Start     Dose/Rate Route Frequency Ordered Stop   03/27/19 0600  ciprofloxacin (CIPRO) IVPB 400 mg     400 mg 200 mL/hr over 60 Minutes Intravenous On call to O.R. 03/26/19 1550 03/27/19 0955   03/23/19 1345  metroNIDAZOLE (FLAGYL) tablet 250 mg  Status:  Discontinued     250 mg Oral 3 times daily after meals 03/23/19 1343 03/27/19 0735      Assessment/Plan: Ms. Guest is a 86 yo POD 5 s/p Ex lap, partial small bowel resection and liver wedge biopsy for small bowel obstruction related to metastasis from colon cancer.  PRN tramadol and fentanyl for pain control IS, OOB to chair today and try to move as much  as possible, RN going to help with IS later also  HD ok, norvasc  Soft diet having BM NS @ 50, intake improving, UOP improving Cr going down H&H stable, no leukocytosis, platelets stable  SCDs, heparin sq Case workers looking for SNF placement, possibly a bed for tomorrow, discussed with hospitalist and ordered appropriate COVID test that should be good for 72 hours for the placement    LOS: 10 days    Virl Cagey 04/01/2019

## 2019-04-02 ENCOUNTER — Inpatient Hospital Stay (HOSPITAL_COMMUNITY): Payer: PPO

## 2019-04-02 DIAGNOSIS — J1282 Pneumonia due to coronavirus disease 2019: Secondary | ICD-10-CM

## 2019-04-02 DIAGNOSIS — U071 COVID-19: Secondary | ICD-10-CM

## 2019-04-02 LAB — COMPREHENSIVE METABOLIC PANEL
ALT: 18 U/L (ref 0–44)
AST: 20 U/L (ref 15–41)
Albumin: 1.4 g/dL — ABNORMAL LOW (ref 3.5–5.0)
Alkaline Phosphatase: 80 U/L (ref 38–126)
Anion gap: 6 (ref 5–15)
BUN: 37 mg/dL — ABNORMAL HIGH (ref 8–23)
CO2: 17 mmol/L — ABNORMAL LOW (ref 22–32)
Calcium: 7.7 mg/dL — ABNORMAL LOW (ref 8.9–10.3)
Chloride: 119 mmol/L — ABNORMAL HIGH (ref 98–111)
Creatinine, Ser: 2.03 mg/dL — ABNORMAL HIGH (ref 0.44–1.00)
GFR calc Af Amer: 25 mL/min — ABNORMAL LOW (ref >60)
GFR calc non Af Amer: 21 mL/min — ABNORMAL LOW (ref >60)
Glucose, Bld: 104 mg/dL — ABNORMAL HIGH (ref 70–99)
Potassium: 3.1 mmol/L — ABNORMAL LOW (ref 3.5–5.1)
Sodium: 142 mmol/L (ref 135–145)
Total Bilirubin: 0.6 mg/dL (ref 0.3–1.2)
Total Protein: 4.3 g/dL — ABNORMAL LOW (ref 6.5–8.1)

## 2019-04-02 LAB — CBC WITH DIFFERENTIAL/PLATELET
Abs Immature Granulocytes: 0.05 10*3/uL (ref 0.00–0.07)
Basophils Absolute: 0 10*3/uL (ref 0.0–0.1)
Basophils Relative: 0 %
Eosinophils Absolute: 0 10*3/uL (ref 0.0–0.5)
Eosinophils Relative: 0 %
HCT: 38 % (ref 36.0–46.0)
Hemoglobin: 12.1 g/dL (ref 12.0–15.0)
Immature Granulocytes: 1 %
Lymphocytes Relative: 7 %
Lymphs Abs: 0.6 10*3/uL — ABNORMAL LOW (ref 0.7–4.0)
MCH: 30 pg (ref 26.0–34.0)
MCHC: 31.8 g/dL (ref 30.0–36.0)
MCV: 94.1 fL (ref 80.0–100.0)
Monocytes Absolute: 0.2 10*3/uL (ref 0.1–1.0)
Monocytes Relative: 2 %
Neutro Abs: 7.8 10*3/uL — ABNORMAL HIGH (ref 1.7–7.7)
Neutrophils Relative %: 90 %
Platelets: 178 10*3/uL (ref 150–400)
RBC: 4.04 MIL/uL (ref 3.87–5.11)
RDW: 16 % — ABNORMAL HIGH (ref 11.5–15.5)
WBC: 8.6 10*3/uL (ref 4.0–10.5)
nRBC: 0 % (ref 0.0–0.2)

## 2019-04-02 LAB — RESPIRATORY PANEL BY RT PCR (FLU A&B, COVID)
Influenza A by PCR: NEGATIVE
Influenza B by PCR: NEGATIVE
SARS Coronavirus 2 by RT PCR: POSITIVE — AB

## 2019-04-02 LAB — HEMOGLOBIN A1C
Hgb A1c MFr Bld: 5.7 % — ABNORMAL HIGH (ref 4.8–5.6)
Mean Plasma Glucose: 116.89 mg/dL

## 2019-04-02 LAB — FERRITIN: Ferritin: 918 ng/mL — ABNORMAL HIGH (ref 11–307)

## 2019-04-02 LAB — D-DIMER, QUANTITATIVE: D-Dimer, Quant: 9.73 ug/mL-FEU — ABNORMAL HIGH (ref 0.00–0.50)

## 2019-04-02 LAB — GLUCOSE, CAPILLARY
Glucose-Capillary: 84 mg/dL (ref 70–99)
Glucose-Capillary: 91 mg/dL (ref 70–99)

## 2019-04-02 LAB — SARS CORONAVIRUS 2 (TAT 6-24 HRS): SARS Coronavirus 2: POSITIVE — AB

## 2019-04-02 LAB — ABO/RH: ABO/RH(D): A POS

## 2019-04-02 LAB — PHOSPHORUS: Phosphorus: 4.1 mg/dL (ref 2.5–4.6)

## 2019-04-02 LAB — C-REACTIVE PROTEIN: CRP: 20.5 mg/dL — ABNORMAL HIGH (ref <1.0)

## 2019-04-02 LAB — MAGNESIUM: Magnesium: 1.5 mg/dL — ABNORMAL LOW (ref 1.7–2.4)

## 2019-04-02 MED ORDER — HYDROCOD POLST-CPM POLST ER 10-8 MG/5ML PO SUER: 5.0000 mL | Freq: Two times a day (BID) | ORAL | Status: DC | PRN

## 2019-04-02 MED ORDER — ZINC SULFATE 220 (50 ZN) MG PO CAPS: 220.0000 mg | ORAL_CAPSULE | Freq: Every day | ORAL | Status: DC

## 2019-04-02 MED ORDER — ADULT MULTIVITAMIN W/MINERALS CH
1.0000 | ORAL_TABLET | Freq: Every day | ORAL | Status: DC
  Administered 2019-04-02 – 2019-04-03 (×2): 1 via ORAL
  Filled 2019-04-02 (×2): qty 1

## 2019-04-02 MED ORDER — ZINC SULFATE 220 (50 ZN) MG PO CAPS
220.0000 mg | ORAL_CAPSULE | Freq: Every day | ORAL | Status: DC
  Administered 2019-04-02 – 2019-04-03 (×2): 220 mg via ORAL
  Filled 2019-04-02 (×2): qty 1

## 2019-04-02 MED ORDER — INSULIN ASPART 100 UNIT/ML ~~LOC~~ SOLN: 0.0000 [IU] | Freq: Three times a day (TID) | SUBCUTANEOUS | Status: DC

## 2019-04-02 MED ORDER — MAGNESIUM SULFATE 2 GM/50ML IV SOLN
2.0000 g | Freq: Once | INTRAVENOUS | Status: AC
  Administered 2019-04-02: 2 g via INTRAVENOUS
  Filled 2019-04-02: qty 50

## 2019-04-02 MED ORDER — DEXAMETHASONE SODIUM PHOSPHATE 10 MG/ML IJ SOLN
6.0000 mg | INTRAMUSCULAR | Status: DC
  Administered 2019-04-02 – 2019-04-03 (×2): 6 mg via INTRAVENOUS
  Filled 2019-04-02 (×2): qty 1

## 2019-04-02 MED ORDER — ASCORBIC ACID 500 MG PO TABS
500.0000 mg | ORAL_TABLET | Freq: Every day | ORAL | Status: DC
  Administered 2019-04-02 – 2019-04-03 (×2): 500 mg via ORAL
  Filled 2019-04-02 (×2): qty 1

## 2019-04-02 MED ORDER — SODIUM CHLORIDE 0.9 % IV SOLN
200.0000 mg | Freq: Once | INTRAVENOUS | Status: AC
  Administered 2019-04-02: 200 mg via INTRAVENOUS
  Filled 2019-04-02: qty 40

## 2019-04-02 MED ORDER — SODIUM CHLORIDE 0.9 % IV SOLN
100.0000 mg | Freq: Every day | INTRAVENOUS | Status: DC
  Administered 2019-04-03: 100 mg via INTRAVENOUS
  Filled 2019-04-02: qty 20

## 2019-04-02 MED ORDER — INSULIN ASPART 100 UNIT/ML ~~LOC~~ SOLN: 0.0000 [IU] | Freq: Every day | SUBCUTANEOUS | Status: DC

## 2019-04-02 MED ORDER — GUAIFENESIN-DM 100-10 MG/5ML PO SYRP: 10.0000 mL | ORAL_SOLUTION | ORAL | Status: DC | PRN

## 2019-04-02 MED ORDER — ASCORBIC ACID 500 MG PO TABS: 500.0000 mg | ORAL_TABLET | Freq: Every day | ORAL | Status: DC

## 2019-04-02 MED ORDER — IPRATROPIUM-ALBUTEROL 20-100 MCG/ACT IN AERS
1.0000 | INHALATION_SPRAY | Freq: Three times a day (TID) | RESPIRATORY_TRACT | Status: DC
  Administered 2019-04-02 – 2019-04-03 (×4): 1 via RESPIRATORY_TRACT
  Filled 2019-04-02: qty 4

## 2019-04-02 NOTE — Progress Notes (Signed)
CSW in contact with [employer] to start authorization. Rep states that although it is a 3 day expected turn around, that they usually try to get them processed as quick as possible.   [facility]  Clinical Social Worker  Ph: [phone]

## 2019-04-02 NOTE — Care Management Important Message (Signed)
Important Message  Patient Details  Name: [name] MRN: [id number] Date of Birth: Sep 27, 1928   Medicare Important Message Given:  Yes(Emily, RN will deliver letter to patient due to airborne and contact precautions)     Tommy Medal 04/02/2019, 3:40 PM

## 2019-04-02 NOTE — Progress Notes (Signed)
[facility] called with results of patient's latest COVID test, patient is now COVID positive.  MD notified, patient moved to [place].  AC notified as well. Patient informed of reason for move and then moved.

## 2019-04-02 NOTE — Plan of Care (Signed)

## 2019-04-02 NOTE — Progress Notes (Signed)
Patient Demographics:    [name], is a 86 y.o. female, DOB - 1928/03/25, BSJ:[id number]  Admit date - 03/21/2019   Admitting Physician Murlean Iba, MD  Outpatient Primary MD for the patient is Kathyrn Drown, MD  LOS - 11   Chief Complaint  Patient presents with  . Diarrhea        Subjective:    [name] today has no fevers, no emesis,  No chest pain, shortness of breath and cough noted, -Requiring 2 to 3 L of oxygen via nasal cannula -Oral intake is fair -Patient tested positive for COVID-19, general surgeon Dr. 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   Palliative care by specialist   Recurrent partial small bowel obstruction   Acute respiratory disease due to COVID-19 virus   COPD (chronic obstructive pulmonary disease) ([place])   Iron deficiency anemia due to chronic blood loss   S/P colon resection   DNR (do not resuscitate)   Encounter for hospice care discussion   Carotid artery disease ([place])   Hyperlipidemia   Peripheral neuropathy   Prediabetes   CKD (chronic kidney disease), stage IV (H[facility])   Urothelial carcinoma of left distal ureter ([place])   Lower GI bleed from cecal cancer   Guaiac positive stools   Goals of care, counseling/discussion   Anemia   Gastrointestinal hemorrhage   Nausea vomiting and diarrhea   Small bowel obstruction ([place])   Brief Admission Hx: 86 y.o.femalewith medical history significantfor 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 12/08/2018 that showed moderately differentiated adenocarcinoma with clear margins, several recent admissions for small bowel obstruction, stage III CKD, chronic hypoxic respiratory  failure, hypertension, COPD, history of urothelial carcinoma involving the left distal ureter status post resection, history of right breast cancer who was readmitted with SBO on 03/21/2019 -Underwent Exploratory laparotomy, partial small bowel resection, wedge liver biopsy on 03/27/2019 -Tested negative for COVID-19 on 03/08/2019, again negative on 03/15/2019 and again negative on 03/21/2019 -Covid testing on 03/31/2018 prior to transfer to SNF came back positive--- chest x-ray with Covid pneumonia and patient has hypoxia and dyspnea Patient tested positive for COVID-19, general surgeon Dr. Arnoldo Morale requesting transfer back to hospitalist service for management of Covid pneumonia on 04/02/19   A/p   1)Acute on Chronic  hypoxic respiratory failure secondary to COVID-19 infection/pneumonia--- The treatment plan and use of medications  for treatment of COVID-19 infection and possible side effects were discussed with patient,  explained that there is No proven definitive treatment for COVID-19 infection, any medications used here are based on published clinical articles/anecdotal data which at times and not yet peer-reviewed or randomized control trials. Complete risks and long-term side effects are unknown, however in the best clinical judgment they seem to be of some clinical benefit . --potential side effects of Remdesivir including, but not limited to allergic reaction, nausea, vomiting, elevated LFTs discussed with patient ,also discussed potential steroid side effect including elevated blood sugars, elevated blood pressure, psychosis/anxiety,  insomnia --Patient verbalizes understanding and agrees to treatment protocols   --  Patient is positive for COVID-19 infection, chest x-ray with findings of infiltrates/opacities,  patient is hypoxic and requiring continuous supplemental oxygen---patient meets criteria for initiation of Remdesivir AND Decadron/Steroid therapy per protocol  -Elevated inflammatory  markers noted including -Ferritin of 918, D-dimer 9.73, CRP 20.5 -WBC is 8.6 --Check and trend fibrinogen, CRP, pro calcitonin, CBC, BMP, d-dimer, LDH, ferritin and LFTs --Supplemental oxygen to keep O2 sats above 93% -Follow serial chest x-rays and ABGs as indicated --- Encourage prone positioning for More than 16 hours/day in increments of 2 to 3 hours at a time if able to tolerate --Attempt to maintain euvolemic state --Zinc and vitamin C as ordered -Albuterol inhaler as needed -Covid test is positive on 03/31/2018, repeat Covid test on 04/01/2018 is positive  2)Recurrent partial SBO -  S/p Exploratory laparotomy, partial small bowel resection, wedge liver biopsy on 03/27/2019.Path reveals metastatic adenocarcinoma involving small bowel and omentum.  It appears to be due to colon primary and not urothelial--- patient previously declined hospice involvement -Tolerating soft diet well, having BMs  3) chronic iron deficiency anemia in the setting of colon malignancy and guaiac positive stools/chronic GI blood loss --- transfuse as clinically indicated, no evidence of significant bleeding at this time  4)CKD stage IV -currently stable with creatinine around 2.0,, renally adjust medications, avoid nephrotoxic agents / dehydration  / hypotension  5)Social/Ethics--- DNR on admission-palliative medicine consult  for end-of-life discussions appreciated  -States that she may consider hospice in the future  6)Hypomagnesemia--- Magnesium is 1.5, replace and recheck  Disposition/Need for in-Hospital Stay- patient unable to be discharged at this time due to --Covid PNA requiring IV Decadron and remdesivir, awaiting transfer to SNF rehab  Code Status : DNR  Family Communication:    (patient is alert, awake and coherent)  POA--- [name]-- [phone]--- left voicemail x 2   Disposition Plan  : SNF Rehab  Consults  :  Gen Surgery/Gi//Palliative  DVT Prophylaxis  :   - Heparin - SCDs    Lab Results  Component Value Date   PLT 178 04/02/2019    Inpatient Medications  Scheduled Meds: . sodium chloride   Intravenous Once  . amLODipine  5 mg Oral Daily  . vitamin C  500 mg Oral Daily  . vitamin C  500 mg Oral Daily  . Chlorhexidine Gluconate Cloth  6 each Topical Daily  . dexamethasone (DECADRON) injection  6 mg Intravenous Q24H  . heparin injection (subcutaneous)  5,000 Units Subcutaneous Q8H  . insulin aspart  0-5 Units Subcutaneous QHS  . insulin aspart  0-6 Units Subcutaneous TID WC  . Ipratropium-Albuterol  1 puff Inhalation TID  . multivitamin with minerals  1 tablet Oral Daily  . pantoprazole  40 mg Oral Q1200  . sodium chloride flush  10-40 mL Intracatheter Q12H  . zinc sulfate  220 mg Oral Daily  . zinc sulfate  220 mg Oral Daily   Continuous Infusions: . sodium chloride 10 mL/hr at 04/02/19 1105  . magnesium sulfate bolus IVPB    . remdesivir 200 mg in sodium chloride 0.9% 250 mL IVPB     Followed by  . [START ON 04/03/2019] remdesivir 100 mg in NS 100 mL     PRN Meds:.acetaminophen **OR** acetaminophen, albuterol, alum & mag hydroxide-simeth, chlorpheniramine-HYDROcodone, chlorpheniramine-HYDROcodone, diphenhydrAMINE **OR** diphenhydrAMINE, fentaNYL (SUBLIMAZE) injection, guaiFENesin-dextromethorphan, guaiFENesin-dextromethorphan, LORazepam, ondansetron **OR** ondansetron (ZOFRAN) IV, simethicone, sodium chloride flush, traMADol    Anti-infectives (From admission, onward)   Start     Dose/Rate Route  Frequency Ordered Stop   04/03/19 1000  remdesivir 100 mg in sodium chloride 0.9 % 100 mL IVPB     100 mg 200 mL/hr over 30 Minutes Intravenous Daily 04/02/19 1613 04/07/19 0959   04/02/19 1615  remdesivir 200 mg in sodium chloride 0.9% 250 mL IVPB     200 mg 580 mL/hr over 30 Minutes Intravenous Once 04/02/19 1613     03/27/19 0600  ciprofloxacin (CIPRO) IVPB 400 mg     400 mg 200 mL/hr over 60 Minutes Intravenous On call to O.R. 03/26/19 1550  03/27/19 0955   03/23/19 1345  metroNIDAZOLE (FLAGYL) tablet 250 mg  Status:  Discontinued     250 mg Oral 3 times daily after meals 03/23/19 1343 03/27/19 0735        Objective:   Vitals:   04/01/19 1454 04/01/19 2142 04/02/19 0612 04/02/19 1201  BP: 128/61 (!) 144/78 137/71 135/69  Pulse: 100 (!) 102 (!) 107 (!) 104  Resp: 18 16 20 18   Temp: 97.7 F (36.5 C) 98.5 F (36.9 C) (!) 97.5 F (36.4 C) 98.1 F (36.7 C)  TempSrc: Oral Oral Oral Oral  SpO2: 99% 97% 94%   Weight:      Height:        Wt Readings from Last 3 Encounters:  03/27/19 55.8 kg  03/19/19 55.2 kg  03/10/19 55 kg     Intake/Output Summary (Last 24 hours) at 04/02/2019 1616 Last data filed at 04/02/2019 1300 Gross per 24 hour  Intake 720 ml  Output 700 ml  Net 20 ml     Physical Exam  Gen:- Awake Alert, frail-appearing HEENT:- [place].AT, No sclera icterus Nose- [place] 3 L/min Neck-Supple Neck,No JVD,.  Lungs-diminished in bases, scattered rhonchi CV- S1, S2 normal, regular  Abd-  +ve B.Sounds, Abd Soft, No tenderness, healing midline abdominal wound    Extremity/Skin:- No  edema, pedal pulses present  Psych-affect is appropriate, oriented x3 Neuro-generalized weakness, no new focal deficits, no tremors   Data Review:   Micro Results Recent Results (from the past 240 hour(s))  Surgical pcr screen     Status: None   Collection Time: 03/26/19  6:46 PM   Specimen: Nasal Mucosa; Nasal Swab  Result Value Ref Range Status   MRSA, PCR NEGATIVE NEGATIVE Final   Staphylococcus aureus NEGATIVE NEGATIVE Final    Comment: (NOTE) The Xpert SA Assay (FDA approved for NASAL specimens in patients 26 years of age and older), is one component of a comprehensive surveillance program. It is not intended to diagnose infection nor to guide or monitor treatment. Performed at [facility], [address], [place],  [address]   SARS CORONAVIRUS 2 (TAT 6-24 HRS) Nasopharyngeal Nasopharyngeal Swab     Status:  Abnormal   Collection Time: 04/01/19  1:10 PM   Specimen: Nasopharyngeal Swab  Result Value Ref Range Status   SARS Coronavirus 2 POSITIVE (A) NEGATIVE Final    Comment: RESULT CALLED TO, READ BACK BY AND VERIFIED WITH: R. BONDURANT,RN 0301 04/02/2019 T. TYSOR (NOTE) SARS-CoV-2 target nucleic acids are DETECTED. The SARS-CoV-2 RNA is generally detectable in upper and lower respiratory specimens during the acute phase of infection. Positive results are indicative of the presence of SARS-CoV-2 RNA. Clinical correlation with patient history and other diagnostic information is  necessary to determine patient infection status. Positive results do not rule out bacterial infection or co-infection with other viruses.  The expected result is Negative. Fact Sheet for Patients: SugarRoll.be Fact Sheet for Healthcare  Providers: https://www.woods-mathews.com/ This test is not yet approved or cleared by the [place] and  has been authorized for detection and/or diagnosis of SARS-CoV-2 by FDA under an Emergency Use Authorization (EUA). This EUA will remain  in effect (meaning this test can be used) fo r the duration of the COVID-19 declaration under Section 564(b)(1) of the Act, 21 U.S.C. section 360bbb-3(b)(1), unless the authorization is terminated or revoked sooner. Performed at [facility], [place] [address], [place], [place] [address]   Respiratory Panel by RT PCR (Flu A&B, Covid) - Nasopharyngeal Swab     Status: Abnormal   Collection Time: 04/02/19 11:04 AM   Specimen: Nasopharyngeal Swab  Result Value Ref Range Status   SARS Coronavirus 2 by RT PCR POSITIVE (A) NEGATIVE Final    Comment: RESULT CALLED TO, READ BACK BY AND VERIFIED WITH: WRIGHT E. AT 1243 ON [id number] BY THOMPSON S.    Influenza A by PCR NEGATIVE NEGATIVE Final   Influenza B by PCR NEGATIVE NEGATIVE Final    Comment: (NOTE) The Xpert Xpress SARS-CoV-2/FLU/RSV assay is  intended as an aid in  the diagnosis of influenza from Nasopharyngeal swab specimens and  should not be used as a sole basis for treatment. Nasal washings and  aspirates are unacceptable for Xpert Xpress SARS-CoV-2/FLU/RSV  testing. Fact Sheet for Patients: PinkCheek.be Fact Sheet for Healthcare Providers: GravelBags.it This test is not yet approved or cleared by the [place] FDA and  has been authorized for detection and/or diagnosis of SARS-CoV-2 by  FDA under an Emergency Use Authorization (EUA). This EUA will remain  in effect (meaning this test can be used) for the duration of the  Covid-19 declaration under Section 564(b)(1) of the Act, 21  U.S.C. section 360bbb-3(b)(1), unless the authorization is  terminated or revoked. Performed at [facility], [address], [place], [place] [address]     Radiology Reports CT ABDOMEN PELVIS WO CONTRAST  Result Date: 03/15/2019 CLINICAL DATA:  Follow-up small-bowel obstruction EXAM: CT ABDOMEN AND PELVIS WITHOUT CONTRAST TECHNIQUE: Multidetector CT imaging of the abdomen and pelvis was performed following the standard protocol without IV contrast. COMPARISON:  03/08/2019 FINDINGS: Lower chest: No acute abnormality. Hepatobiliary: Gallbladder is partially contracted. Stable appearance of scattered low-density lesions throughout the liver including a subcapsular lesion at the posterior aspect of the right hepatic lobe containing dystrophic calcification. No definite new hepatic lesion identified. No biliary dilatation. Pancreas: Unremarkable. No pancreatic ductal dilatation or surrounding inflammatory changes. Spleen: Normal in size without focal abnormality. Adrenals/Urinary Tract: Adrenal glands are unremarkable. Kidneys are within normal limits without focal lesion or hydronephrosis. There are renal vascular calcifications. Bladder is unremarkable. Stomach/Bowel: Persistent small bowel  obstruction with transition point in the right hemipelvis (series 6, images 30-34). The degree of bowel dilatation is similar to the previous exam. By the colon remains largely collapsed. Stomach is mildly distended as well. Vascular/Lymphatic: Aortic atherosclerosis. No enlarged abdominal or pelvic lymph nodes. Reproductive: Status post hysterectomy. No adnexal masses. Other: No ascites. No pneumoperitoneum. No abdominal wall hernia. Musculoskeletal: No new or acute osseous findings. IMPRESSION: 1. Persistent small bowel obstruction with transition point in the right hemipelvis, likely secondary to adhesion. No free fluid or free air to suggest perforation. 2. Additional chronic findings, as above. Aortic Atherosclerosis (ICD10-I70.0). Electronically Signed   By: Davina Poke D.O.   On: 03/15/2019 18:43   CT ABDOMEN PELVIS WO CONTRAST  Result Date: 03/08/2019 CLINICAL DATA:  Abdominal pain, nausea, and vomiting for a  week, history breast cancer, COPD, hypertension, diabetes mellitus EXAM: CT ABDOMEN AND PELVIS WITHOUT CONTRAST TECHNIQUE: Multidetector CT imaging of the abdomen and pelvis was performed following the standard protocol without IV contrast. Sagittal and coronal MPR images reconstructed from axial data set. No oral contrast was administered. COMPARISON:  12/04/2018 FINDINGS: Lower chest: Chronic atelectasis versus scarring at RIGHT lower lobe. Remaining lung bases clear. Hepatobiliary: Gallbladder unremarkable. Multiple low-attenuation foci within liver likely representing small cysts. Additional cystic lesion with dystrophic calcification 2.6 x 2.4 cm image 17, posterior RIGHT lobe liver, unchanged. Pancreas: Normal appearance Spleen: Normal appearance Adrenals/Urinary Tract: Thickening of adrenal glands without discrete mass. Renal vascular calcifications. No definite renal mass or hydronephrosis. Bladder and ureters unremarkable. Stomach/Bowel: Prior ileocolic resection. Stomach  unremarkable. Colon decompressed. Dilated proximal and decompressed distal small bowel loops compatible with small bowel obstruction. Transition from dilated to nondilated small bowel occurs in the RIGHT lower quadrant, favor adhesion. Minimal scattered edema within mesentery. No definite bowel wall thickening. Vascular/Lymphatic: Atherosclerotic calcifications aorta and iliac arteries. Aorta normal caliber. No adenopathy. Reproductive: Uterus surgically absent. Nonvisualization of ovaries. Other: No free air or free fluid.  No hernia. Musculoskeletal: Osseous demineralization. IMPRESSION: Distal small bowel obstruction, suspect related to adhesion in the RIGHT upper pelvis. Stable hepatic cysts including a 2.6 x 2.4 cm cyst with dystrophic calcification at the posterior RIGHT lobe liver. Aortic Atherosclerosis (ICD10-I70.0). Electronically Signed   By: Lavonia Dana M.D.   On: 03/08/2019 18:58   DG Abd 1 View  Result Date: 03/26/2019 CLINICAL DATA:  Small-bowel obstruction EXAM: ABDOMEN - 1 VIEW COMPARISON:  March 25, 2019 FINDINGS: There remains mild air-filled dilated loops of small bowel seen within the mid abdomen measuring up to 4.4 cm. There is air seen to the level of the rectum. No acute osseous abnormality. IMPRESSION: No significant change in findings suggestive of partial small bowel obstruction Electronically Signed   By: Prudencio Pair M.D.   On: 03/26/2019 06:04   DG CHEST PORT 1 VIEW  Result Date: 04/02/2019 CLINICAL DATA:  Acute respiratory disease due to COVID-19. EXAM: PORTABLE CHEST 1 VIEW COMPARISON:  March 15, 2019 FINDINGS: The heart size borderline. The hila and mediastinum are normal. Right PICC line terminates in the SVC. No pneumothorax. Increased interstitial opacities are seen in both lungs, more focal in the right upper lobe in the left base. There is probably a small effusion on the left. No other acute abnormalities. IMPRESSION: Bilateral diffuse interstitial opacities with  more focal opacity in the right upper lobe in left base are favored to represent sequela of the patient's COVID-19 status. Pulmonary edema could have a similar appearance. Recommend clinical correlation. Electronically Signed   By: Dorise Bullion III M.D   On: 04/02/2019 13:05   DG Chest Portable 1 View  Result Date: 03/15/2019 CLINICAL DATA:  NG tube placement EXAM: PORTABLE CHEST 1 VIEW COMPARISON:  03/08/2019 FINDINGS: Suspected skin fold artifact over the right upper thorax. Clips at the right chest. No acute consolidation, pleural effusion or pneumothorax. Stable cardiomediastinal silhouette with aortic atherosclerosis. Esophageal tube tip is below the diaphragm but incompletely visualized. IMPRESSION: 1. Esophageal tube tip below the diaphragm but incompletely visualized. 2. No acute airspace disease. Electronically Signed   By: Donavan Foil M.D.   On: 03/15/2019 21:53   DG Chest Port 1 View  Result Date: 03/08/2019 CLINICAL DATA:  Abdominal pain with nausea and vomiting. EXAM: PORTABLE CHEST 1 VIEW COMPARISON:  Chest x-ray dated 03/28/2018 FINDINGS: The heart size  and pulmonary vascularity are normal. Aortic atherosclerosis. The lungs are clear. No significant bone abnormality. IMPRESSION: 1. No active disease in the chest. 2.  Aortic Atherosclerosis (ICD10-I70.0). Electronically Signed   By: Lorriane Shire M.D.   On: 03/08/2019 16:28   DG Abd 2 Views  Result Date: 03/25/2019 CLINICAL DATA:  Small bowel obstruction. EXAM: ABDOMEN - 2 VIEW COMPARISON:  03/24/2019 FINDINGS: There is moderate dilatation of multiple small bowel loops without significant interval change. No intraperitoneal free air is identified. There is mild lumbar scoliosis. IMPRESSION: Unchanged small bowel dilatation consistent with obstruction. Electronically Signed   By: Logan Bores M.D.   On: 03/25/2019 07:54   Acute Abd Series  Result Date: 03/21/2019 CLINICAL DATA:  Nausea, vomiting, diarrhea, abdominal pain EXAM: DG  ABDOMEN ACUTE W/ 1V CHEST COMPARISON:  03/09/2019 FINDINGS: There is no evidence of dilated bowel loops or free intraperitoneal air. Heart size and mediastinal contours are within normal limits. Both lungs are clear. IMPRESSION: Negative abdominal radiographs.  No acute cardiopulmonary disease. Electronically Signed   By: Macy Mis M.D.   On: 03/21/2019 08:52   DG ABD ACUTE 2+V W 1V CHEST  Result Date: 03/09/2019 CLINICAL DATA:  Abdominal pain with nausea and vomiting. EXAM: DG ABDOMEN ACUTE W/ 1V CHEST COMPARISON:  Chest x-ray 03/08/2019 FINDINGS: Lungs are adequately inflated without focal airspace consolidation or effusion. Cardiomediastinal silhouette and remainder of the chest is unchanged. Abdominopelvic images demonstrate a nasogastric tube with tip over the stomach in the left upper quadrant. Bowel gas pattern is nonobstructive. There is no free peritoneal air. There are degenerative changes of the spine and hips. IMPRESSION: Negative abdominal radiographs.  No acute cardiopulmonary disease. Nasogastric tube with tip over the stomach in the left upper quadrant. Electronically Signed   By: Marin Olp M.D.   On: 03/09/2019 10:05   DG Abd Portable 1V  Result Date: 03/24/2019 CLINICAL DATA:  History of colonic resection September 2020. History of small-bowel obstructions. History of breast cancer. EXAM: PORTABLE ABDOMEN - 1 VIEW COMPARISON:  March 21, 2019 FINDINGS: Dilated loops of small bowel are identified consistent with small-bowel obstruction. Evaluation for free air is limited due to the lack of upright imaging or decubitus imaging. IMPRESSION: Small bowel obstruction. Evaluation for free air is limited as above. Electronically Signed   By: Dorise Bullion III M.D   On: 03/24/2019 13:06   [place] EKG SITE RITE  Result Date: 03/29/2019 If Site Rite image not attached, placement could not be confirmed due to current cardiac rhythm.    CBC Recent Labs  Lab 03/29/19 0646 03/30/19 0007  03/31/19 0753 04/01/19 0623 04/02/19 1215  WBC 12.6* 11.4* 9.3 9.3 8.6  HGB 6.9* 10.7* 11.2* 11.3* 12.1  HCT 22.9* 33.5* 35.8* 36.3 38.0  PLT 233 194 148* 150 178  MCV 98.7 95.2 94.7 95.8 94.1  MCH 29.7 30.4 29.6 29.8 30.0  MCHC 30.1 31.9 31.3 31.1 31.8  RDW 15.7* 15.6* 16.2* 16.2* 16.0*  LYMPHSABS  --   --   --   --  0.6*  MONOABS  --   --   --   --  0.2  EOSABS  --   --   --   --  0.0  BASOSABS  --   --   --   --  0.0    Chemistries  Recent Labs  Lab 03/28/19 0609 03/28/19 0609 03/29/19 0646 03/30/19 0007 03/31/19 0753 04/01/19 0623 04/02/19 1215  NA 140   < > 138  136 142 142 142  K 4.7   < > 3.9 3.4* 3.6 3.5 3.1*  CL 111   < > 113* 112* 116* 118* 119*  CO2 20*   < > 18* 16* 17* 16* 17*  GLUCOSE 94   < > 112* 85 79 86 104*  BUN 32*   < > 40* 40* 38* 39* 37*  CREATININE 1.99*   < > 2.38* 2.28* 2.22* 2.02* 2.03*  CALCIUM 7.3*   < > 6.7* 7.1* 7.5* 7.7* 7.7*  MG 1.6*  --  1.7 1.7  --   --  1.5*  AST  --   --   --   --   --   --  20  ALT  --   --   --   --   --   --  18  ALKPHOS  --   --   --   --   --   --  80  BILITOT  --   --   --   --   --   --  0.6   < > = values in this interval not displayed.   ------------------------------------------------------------------------------------------------------------------ No results for input(s): CHOL, HDL, LDLCALC, TRIG, CHOLHDL, LDLDIRECT in the last 72 hours.  Lab Results  Component Value Date   HGBA1C 5.2 05/08/2018   ------------------------------------------------------------------------------------------------------------------ No results for input(s): TSH, T4TOTAL, T3FREE, THYROIDAB in the last 72 hours.  Invalid input(s): FREET3 ------------------------------------------------------------------------------------------------------------------ Recent Labs    04/02/19 1215  FERRITIN 918*    Coagulation profile No results for input(s): INR, PROTIME in the last 168 hours.  Recent Labs    04/02/19 1215    DDIMER 9.73*    Cardiac Enzymes No results for input(s): CKMB, TROPONINI, MYOGLOBIN in the last 168 hours.  Invalid input(s): CK ------------------------------------------------------------------------------------------------------------------    Component Value Date/Time   BNP 181.0 (H) 03/28/2018 1634     Roxan Hockey M.D on 04/02/2019 at 4:16 PM  Go to [email or web address] - for contact info  [facility] - Office  [phone]

## 2019-04-02 NOTE — Progress Notes (Signed)
6 Days Post-Op  Subjective:   Patient transferred to Covid unit due to positive test.  States she is a little short of breath.  Objective: Vital signs in last 24 hours: Temp:  [97.5 F (36.4 C)-98.5 F (36.9 C)] 97.5 F (36.4 C) (01/18 0612) Pulse Rate:  [100-107] 107 (01/18 0612) Resp:  [16-20] 20 (01/18 0612) BP: (128-144)/(61-78) 137/71 (01/18 0612) SpO2:  [93 %-99 %] 94 % (01/18 0612) Last BM Date: 03/31/19  Intake/Output from previous day: 01/17 0701 - 01/18 0700 In: 720 [P.O.:720] Out: 700 [Urine:700] Intake/Output this shift: No intake/output data recorded.  General appearance: cooperative, appears stated age and fatigued GI: Soft, incision healing well.  Nontender.  Lab Results:  Recent Labs    03/31/19 0753 04/01/19 0623  WBC 9.3 9.3  HGB 11.2* 11.3*  HCT 35.8* 36.3  PLT 148* 150   BMET Recent Labs    03/31/19 0753 04/01/19 0623  NA 142 142  K 3.6 3.5  CL 116* 118*  CO2 17* 16*  GLUCOSE 79 86  BUN 38* 39*  CREATININE 2.22* 2.02*  CALCIUM 7.5* 7.7*   PT/INR No results for input(s): LABPROT, INR in the last 72 hours.  Studies/Results: No results found.  Anti-infectives: Anti-infectives (From admission, onward)   Start     Dose/Rate Route Frequency Ordered Stop   03/27/19 0600  ciprofloxacin (CIPRO) IVPB 400 mg     400 mg 200 mL/hr over 60 Minutes Intravenous On call to O.R. 03/26/19 1550 03/27/19 0955   03/23/19 1345  metroNIDAZOLE (FLAGYL) tablet 250 mg  Status:  Discontinued     250 mg Oral 3 times daily after meals 03/23/19 1343 03/27/19 0735      Assessment/Plan: s/p Procedure(s): PARTIAL SMALL BOWEL RESECTION LIVER BIOPSY Impression: Patient turned positive with a Covid test that was being done for discharge planning and is now in the Covid unit.  Dr. Denton Brick of the hospitalist has been directly consulted and saw the patient with me.  Discharge on hold until it is determined whether she needs further therapy for Covid.  LOS: 11  days    Aviva Signs 04/02/2019

## 2019-04-02 NOTE — Progress Notes (Signed)
CSW in contact with patient on room phone to inform her that [facility] is no longer an option for her discharge destination. Patient referral is currently being reviewed by [facility]. CSW will continue to follow patient for discharge related needs.  [place] [facility]  Clinical Social Worker  Ph: [phone]

## 2019-04-02 NOTE — Progress Notes (Signed)
CSW attempted to contact Carie, Education officer, museum at [place] [facility] regarding possible admission. CSW left VM requesting call back. CSW will continue following patient for discharge related needs.  [place] [facility]  Clinical Social Worker  Ph: [phone]

## 2019-04-03 DIAGNOSIS — N179 Acute kidney failure, unspecified: Secondary | ICD-10-CM | POA: Diagnosis not present

## 2019-04-03 DIAGNOSIS — D649 Anemia, unspecified: Secondary | ICD-10-CM | POA: Diagnosis not present

## 2019-04-03 DIAGNOSIS — J069 Acute upper respiratory infection, unspecified: Secondary | ICD-10-CM | POA: Diagnosis not present

## 2019-04-03 DIAGNOSIS — E785 Hyperlipidemia, unspecified: Secondary | ICD-10-CM | POA: Diagnosis not present

## 2019-04-03 DIAGNOSIS — C50911 Malignant neoplasm of unspecified site of right female breast: Secondary | ICD-10-CM | POA: Diagnosis not present

## 2019-04-03 DIAGNOSIS — N184 Chronic kidney disease, stage 4 (severe): Secondary | ICD-10-CM | POA: Diagnosis not present

## 2019-04-03 DIAGNOSIS — K56609 Unspecified intestinal obstruction, unspecified as to partial versus complete obstruction: Secondary | ICD-10-CM | POA: Diagnosis not present

## 2019-04-03 DIAGNOSIS — I129 Hypertensive chronic kidney disease with stage 1 through stage 4 chronic kidney disease, or unspecified chronic kidney disease: Secondary | ICD-10-CM | POA: Diagnosis not present

## 2019-04-03 DIAGNOSIS — U071 COVID-19: Secondary | ICD-10-CM | POA: Diagnosis not present

## 2019-04-03 DIAGNOSIS — Z853 Personal history of malignant neoplasm of breast: Secondary | ICD-10-CM | POA: Diagnosis not present

## 2019-04-03 DIAGNOSIS — C180 Malignant neoplasm of cecum: Secondary | ICD-10-CM | POA: Diagnosis not present

## 2019-04-03 DIAGNOSIS — C669 Malignant neoplasm of unspecified ureter: Secondary | ICD-10-CM | POA: Diagnosis not present

## 2019-04-03 DIAGNOSIS — J1282 Pneumonia due to coronavirus disease 2019: Secondary | ICD-10-CM | POA: Diagnosis not present

## 2019-04-03 DIAGNOSIS — J9621 Acute and chronic respiratory failure with hypoxia: Secondary | ICD-10-CM | POA: Diagnosis not present

## 2019-04-03 DIAGNOSIS — J9601 Acute respiratory failure with hypoxia: Secondary | ICD-10-CM | POA: Diagnosis not present

## 2019-04-03 DIAGNOSIS — E1122 Type 2 diabetes mellitus with diabetic chronic kidney disease: Secondary | ICD-10-CM | POA: Diagnosis not present

## 2019-04-03 DIAGNOSIS — R7989 Other specified abnormal findings of blood chemistry: Secondary | ICD-10-CM | POA: Diagnosis not present

## 2019-04-03 DIAGNOSIS — R0902 Hypoxemia: Secondary | ICD-10-CM | POA: Diagnosis not present

## 2019-04-03 DIAGNOSIS — I10 Essential (primary) hypertension: Secondary | ICD-10-CM | POA: Diagnosis not present

## 2019-04-03 DIAGNOSIS — E872 Acidosis: Secondary | ICD-10-CM | POA: Diagnosis not present

## 2019-04-03 DIAGNOSIS — I959 Hypotension, unspecified: Secondary | ICD-10-CM | POA: Diagnosis not present

## 2019-04-03 DIAGNOSIS — Z9049 Acquired absence of other specified parts of digestive tract: Secondary | ICD-10-CM | POA: Diagnosis not present

## 2019-04-03 DIAGNOSIS — J9600 Acute respiratory failure, unspecified whether with hypoxia or hypercapnia: Secondary | ICD-10-CM | POA: Diagnosis present

## 2019-04-03 DIAGNOSIS — C799 Secondary malignant neoplasm of unspecified site: Secondary | ICD-10-CM | POA: Diagnosis not present

## 2019-04-03 DIAGNOSIS — C787 Secondary malignant neoplasm of liver and intrahepatic bile duct: Secondary | ICD-10-CM | POA: Diagnosis not present

## 2019-04-03 DIAGNOSIS — J449 Chronic obstructive pulmonary disease, unspecified: Secondary | ICD-10-CM | POA: Diagnosis not present

## 2019-04-03 DIAGNOSIS — N170 Acute kidney failure with tubular necrosis: Secondary | ICD-10-CM | POA: Diagnosis not present

## 2019-04-03 DIAGNOSIS — Z66 Do not resuscitate: Secondary | ICD-10-CM | POA: Diagnosis not present

## 2019-04-03 DIAGNOSIS — K921 Melena: Secondary | ICD-10-CM | POA: Diagnosis not present

## 2019-04-03 DIAGNOSIS — C189 Malignant neoplasm of colon, unspecified: Secondary | ICD-10-CM | POA: Diagnosis not present

## 2019-04-03 DIAGNOSIS — E875 Hyperkalemia: Secondary | ICD-10-CM | POA: Diagnosis not present

## 2019-04-03 DIAGNOSIS — J189 Pneumonia, unspecified organism: Secondary | ICD-10-CM | POA: Diagnosis not present

## 2019-04-03 DIAGNOSIS — D500 Iron deficiency anemia secondary to blood loss (chronic): Secondary | ICD-10-CM | POA: Diagnosis not present

## 2019-04-03 DIAGNOSIS — G9341 Metabolic encephalopathy: Secondary | ICD-10-CM | POA: Diagnosis not present

## 2019-04-03 DIAGNOSIS — K922 Gastrointestinal hemorrhage, unspecified: Secondary | ICD-10-CM | POA: Diagnosis not present

## 2019-04-03 DIAGNOSIS — Z7401 Bed confinement status: Secondary | ICD-10-CM | POA: Diagnosis not present

## 2019-04-03 DIAGNOSIS — R0602 Shortness of breath: Secondary | ICD-10-CM | POA: Diagnosis not present

## 2019-04-03 DIAGNOSIS — I779 Disorder of arteries and arterioles, unspecified: Secondary | ICD-10-CM | POA: Diagnosis not present

## 2019-04-03 DIAGNOSIS — C662 Malignant neoplasm of left ureter: Secondary | ICD-10-CM | POA: Diagnosis not present

## 2019-04-03 DIAGNOSIS — J440 Chronic obstructive pulmonary disease with acute lower respiratory infection: Secondary | ICD-10-CM | POA: Diagnosis not present

## 2019-04-03 DIAGNOSIS — Z515 Encounter for palliative care: Secondary | ICD-10-CM | POA: Diagnosis not present

## 2019-04-03 DIAGNOSIS — K56600 Partial intestinal obstruction, unspecified as to cause: Secondary | ICD-10-CM | POA: Diagnosis not present

## 2019-04-03 DIAGNOSIS — E8809 Other disorders of plasma-protein metabolism, not elsewhere classified: Secondary | ICD-10-CM | POA: Diagnosis not present

## 2019-04-03 LAB — COMPREHENSIVE METABOLIC PANEL
ALT: 17 U/L (ref 0–44)
AST: 19 U/L (ref 15–41)
Albumin: 1.3 g/dL — ABNORMAL LOW (ref 3.5–5.0)
Alkaline Phosphatase: 74 U/L (ref 38–126)
Anion gap: 9 (ref 5–15)
BUN: 39 mg/dL — ABNORMAL HIGH (ref 8–23)
CO2: 17 mmol/L — ABNORMAL LOW (ref 22–32)
Calcium: 7.8 mg/dL — ABNORMAL LOW (ref 8.9–10.3)
Chloride: 116 mmol/L — ABNORMAL HIGH (ref 98–111)
Creatinine, Ser: 2.12 mg/dL — ABNORMAL HIGH (ref 0.44–1.00)
GFR calc Af Amer: 23 mL/min — ABNORMAL LOW (ref >60)
GFR calc non Af Amer: 20 mL/min — ABNORMAL LOW (ref >60)
Glucose, Bld: 136 mg/dL — ABNORMAL HIGH (ref 70–99)
Potassium: 3.1 mmol/L — ABNORMAL LOW (ref 3.5–5.1)
Sodium: 142 mmol/L (ref 135–145)
Total Bilirubin: 0.4 mg/dL (ref 0.3–1.2)
Total Protein: 4.2 g/dL — ABNORMAL LOW (ref 6.5–8.1)

## 2019-04-03 LAB — CBC WITH DIFFERENTIAL/PLATELET
Abs Immature Granulocytes: 0.07 10*3/uL (ref 0.00–0.07)
Basophils Absolute: 0 10*3/uL (ref 0.0–0.1)
Basophils Relative: 0 %
Eosinophils Absolute: 0 10*3/uL (ref 0.0–0.5)
Eosinophils Relative: 0 %
HCT: 36.6 % (ref 36.0–46.0)
Hemoglobin: 11.5 g/dL — ABNORMAL LOW (ref 12.0–15.0)
Immature Granulocytes: 1 %
Lymphocytes Relative: 5 %
Lymphs Abs: 0.5 10*3/uL — ABNORMAL LOW (ref 0.7–4.0)
MCH: 29.5 pg (ref 26.0–34.0)
MCHC: 31.4 g/dL (ref 30.0–36.0)
MCV: 93.8 fL (ref 80.0–100.0)
Monocytes Absolute: 0.1 10*3/uL (ref 0.1–1.0)
Monocytes Relative: 2 %
Neutro Abs: 8 10*3/uL — ABNORMAL HIGH (ref 1.7–7.7)
Neutrophils Relative %: 92 %
Platelets: 188 10*3/uL (ref 150–400)
RBC: 3.9 MIL/uL (ref 3.87–5.11)
RDW: 15.9 % — ABNORMAL HIGH (ref 11.5–15.5)
WBC: 8.7 10*3/uL (ref 4.0–10.5)
nRBC: 0 % (ref 0.0–0.2)

## 2019-04-03 LAB — D-DIMER, QUANTITATIVE: D-Dimer, Quant: 9.54 ug/mL-FEU — ABNORMAL HIGH (ref 0.00–0.50)

## 2019-04-03 LAB — MAGNESIUM: Magnesium: 2 mg/dL (ref 1.7–2.4)

## 2019-04-03 LAB — GLUCOSE, CAPILLARY
Glucose-Capillary: 139 mg/dL — ABNORMAL HIGH (ref 70–99)
Glucose-Capillary: 121 mg/dL — ABNORMAL HIGH (ref 70–99)
Glucose-Capillary: 80 mg/dL (ref 70–99)

## 2019-04-03 LAB — FERRITIN: Ferritin: 798 ng/mL — ABNORMAL HIGH (ref 11–307)

## 2019-04-03 LAB — C-REACTIVE PROTEIN: CRP: 19 mg/dL — ABNORMAL HIGH (ref <1.0)

## 2019-04-03 LAB — PHOSPHORUS: Phosphorus: 5 mg/dL — ABNORMAL HIGH (ref 2.5–4.6)

## 2019-04-03 MED ORDER — ZINC SULFATE 220 (50 ZN) MG PO CAPS: 220.0000 mg | ORAL_CAPSULE | Freq: Every day | ORAL | 2 refills | Status: AC

## 2019-04-03 MED ORDER — ASCORBIC ACID 500 MG PO TABS: 500.0000 mg | ORAL_TABLET | Freq: Every day | ORAL | 2 refills | Status: AC

## 2019-04-03 MED ORDER — DEXAMETHASONE 6 MG PO TABS: 6.0000 mg | ORAL_TABLET | Freq: Every day | ORAL | 0 refills | Status: AC

## 2019-04-03 MED ORDER — INSULIN ASPART 100 UNIT/ML FLEXPEN: 0.0000 [IU] | PEN_INJECTOR | Freq: Three times a day (TID) | SUBCUTANEOUS | 11 refills | Status: AC

## 2019-04-03 MED ORDER — GUAIFENESIN-DM 100-10 MG/5ML PO SYRP: 10.0000 mL | ORAL_SOLUTION | ORAL | 0 refills | Status: AC | PRN

## 2019-04-03 MED ORDER — TRAMADOL HCL 50 MG PO TABS: 50.0000 mg | ORAL_TABLET | Freq: Four times a day (QID) | ORAL | 0 refills | Status: AC | PRN

## 2019-04-03 MED ORDER — GUAIFENESIN ER 600 MG PO TB12: 600.0000 mg | ORAL_TABLET | Freq: Two times a day (BID) | ORAL | 0 refills | Status: AC

## 2019-04-03 MED ORDER — ACETAMINOPHEN 325 MG PO TABS: 650.0000 mg | ORAL_TABLET | Freq: Four times a day (QID) | ORAL | 0 refills | Status: AC | PRN

## 2019-04-03 MED ORDER — SENNOSIDES-DOCUSATE SODIUM 8.6-50 MG PO TABS: 2.0000 | ORAL_TABLET | Freq: Every day | ORAL | 1 refills | Status: AC

## 2019-04-03 NOTE — Progress Notes (Signed)
Patient scheduled for outpatient Remdesivir infusion at 11:30 on Wednesday, 1/20, Thursday, 1/21, and Friday, 1/22.   Please advise them to report to [facility] at [address]  Drive to the security guard and tell them you are here for an infusion. They will direct you to the front entrance where we will come and get you.  For questions call [phone].  Thanks

## 2019-04-03 NOTE — Discharge Instructions (Addendum)
1)Patient scheduled for outpatient Remdesivir infusion at 11:30 on Wednesday, 1/20, Thursday, 1/21, and Friday, 1/22.   Please advise them to report to [facility] at [address].  Drive to the security guard and tell them you are here for an infusion. They will direct you to the front entrance where we will come and get you.  For questions call [phone].  2)Avoid ibuprofen/Advil/Aleve/Motrin/Goody Powders/Naproxen/BC powders/Meloxicam/Diclofenac/Indomethacin and other Nonsteroidal anti-inflammatory medications as these will make you more likely to bleed and can cause stomach ulcers, can also cause Kidney problems.   3)insulin aspart (novoLOG) injection 0-10 Units  0-10 Units Subcutaneous, 3 times daily with meals  CBG < 70: Implement Hypoglycemia Standing Orders and refer to Hypoglycemia Standing Orders sidebar report   CBG 70 - 120: 0 unit CBG 121 - 150: 0 unit  CBG 151 - 200: 1 unit  CBG 201 - 250: 2 units  CBG 251 - 300: 4 units  CBG 301 - 350: 6 units   CBG 351 - 400: 8 units  CBG > 400: 10 units  4)Please check CBC and CMP blood test every Friday starting 04/06/2019  5) outpatient follow-up with general surgeon Dr. Arnoldo Morale in 1 to 2 weeks  6) You are strongly advised to  to isolate/quarantine for at least 21 days from the date of your diagnoses (04/01/2019) with COVID-19 infection--please always wear a mask if you have to go outside the house  7) patient has right arm PICC line placed on 03/29/2019--- please remove PICC line on 04/06/2019 after completing IV remdesivir infusions   You are scheduled for an outpatient infusion of Remdesivir at 11:30 on Wednesday, 1/20, Thursday, 1/21, and Friday, 1/22.  Please [facility] to [facility] at [address]  Drive to the security guard and tell them you are here for an infusion. They will direct you to the front entrance where we will come and get you.  For questions call [phone].  Thanks         SOFT  DIET RECOMMENDED   Nausea and Vomiting, Adult Nausea is feeling sick to your stomach or feeling that you are about to throw up (vomit). Vomiting is when food in your stomach is thrown up and out of the mouth. Throwing up can make you feel weak. It can also make you lose too much water in your body (get dehydrated). If you lose too much water in your body, you may:  Feel tired.  Feel thirsty.  Have a dry mouth.  Have cracked lips.  Go pee (urinate) less often. Older adults and people with other diseases or a weak body defense system (immune system) are at higher risk for losing too much water in the body. If you feel sick to your stomach and you throw up, it is important to follow instructions from your doctor about how to take care of yourself. Follow these instructions at home: Watch your symptoms for any changes. Tell your doctor about them. Follow these instructions to care for yourself at home. Eating and drinking      Take an ORS (oral rehydration solution). This is a drink that is sold at pharmacies and stores.  Drink clear fluids in small amounts as you are able, such as: ? Water. ? Ice chips. ? Fruit juice that has water added (diluted fruit juice). ? Low-calorie sports drinks.  Eat bland, easy-to-digest foods in small amounts as you are able, such as: ? Bananas. ? Applesauce. ? Rice. ?  Low-fat (lean) meats. ? Toast. ? Crackers.  Avoid drinking fluids that have a lot of sugar or caffeine in them. This includes energy drinks, sports drinks, and soda.  Avoid alcohol.  Avoid spicy or fatty foods. General instructions  Take over-the-counter and prescription medicines only as told by your doctor.  Drink enough fluid to keep your pee (urine) pale yellow.  Wash your hands often with soap and water. If you cannot use soap and water, use hand sanitizer.  Make sure that all people in your home wash their hands well and often.  Rest at home while you get  better.  Watch your condition for any changes.  Take slow and deep breaths when you feel sick to your stomach.  Keep all follow-up visits as told by your doctor. This is important. Contact a doctor if:  Your symptoms get worse.  You have new symptoms.  You have a fever.  You cannot drink fluids without throwing up.  You feel sick to your stomach for more than 2 days.  You feel light-headed or dizzy.  You have a headache.  You have muscle cramps.  You have a rash.  You have pain while peeing. Get help right away if:  You have pain in your chest, neck, arm, or jaw.  You feel very weak or you pass out (faint).  You throw up again and again.  You have throw up that is bright red or looks like black coffee grounds.  You have bloody or black poop (stools) or poop that looks like tar.  You have a very bad headache, a stiff neck, or both.  You have very bad pain, cramping, or bloating in your belly (abdomen).  You have trouble breathing.  You are breathing very quickly.  Your heart is beating very quickly.  Your skin feels cold and clammy.  You feel confused.  You have signs of losing too much water in your body, such as: ? Dark pee, very little pee, or no pee. ? Cracked lips. ? Dry mouth. ? Sunken eyes. ? Sleepiness. ? Weakness. These symptoms may be an emergency. Do not wait to see if the symptoms will go away. Get medical help right away. Call your local emergency services (911 in the [place] Do not drive yourself to the hospital. Summary  Nausea is feeling sick to your stomach or feeling that you are about to throw up (vomit). Vomiting is when food in your stomach is thrown up and out of the mouth.  Follow instructions from your doctor about eating and drinking to keep from losing too much water in your body.  Take over-the-counter and prescription medicines only as told by your doctor.  Contact your doctor if your symptoms get worse or you have new  symptoms.  Keep all follow-up visits as told by your doctor. This is important. This information is not intended to replace advice given to you by your health care provider. Make sure you discuss any questions you have with your health care provider. Document Revised: 06/23/2018 Document Reviewed: 08/09/2017 Elsevier Patient Education  [place].   IMPORTANT INFORMATION: PAY CLOSE ATTENTION   PHYSICIAN DISCHARGE INSTRUCTIONS  Follow with Primary care provider  Kathyrn Drown, MD  and other consultants as instructed by your Hospitalist Physician  [place] IF SYMPTOMS COME BACK, WORSEN OR NEW PROBLEM DEVELOPS   Please note: You were cared for by a hospitalist during your hospital stay. Every effort will  be made to forward records to your primary care provider.  You can request that your primary care provider send for your hospital records if they have not received them.  Once you are discharged, your primary care physician will handle any further medical issues. Please note that NO REFILLS for any discharge medications will be authorized once you are discharged, as it is imperative that you return to your primary care physician (or establish a relationship with a primary care physician if you do not have one) for your post hospital discharge needs so that they can reassess your need for medications and monitor your lab values.  Please get a complete blood count and chemistry panel checked by your Primary MD at your next visit, and again as instructed by your Primary MD.  Get Medicines reviewed and adjusted: Please take all your medications with you for your next visit with your Primary MD  Laboratory/radiological data: Please request your Primary MD to go over all hospital tests and procedure/radiological results at the follow up, please ask your primary care provider to get all Hospital records sent to his/her office.  In some cases, they will  be blood work, cultures and biopsy results pending at the time of your discharge. Please request that your primary care provider follow up on these results.  If you are diabetic, please bring your blood sugar readings with you to your follow up appointment with primary care.    Please call and make your follow up appointments as soon as possible.    Also Note the following: If you experience worsening of your admission symptoms, develop shortness of breath, life threatening emergency, suicidal or homicidal thoughts you must seek medical attention immediately by calling 911 or calling your MD immediately  if symptoms less severe.  You must read complete instructions/literature along with all the possible adverse reactions/side effects for all the Medicines you take and that have been prescribed to you. Take any new Medicines after you have completely understood and accpet all the possible adverse reactions/side effects.   Do not drive when taking Pain medications or sleeping medications (Benzodiazepines)  Do not take more than prescribed Pain, Sleep and Anxiety Medications. It is not advisable to combine anxiety,sleep and pain medications without talking with your primary care practitioner  Special Instructions: If you have smoked or chewed Tobacco  in the last 2 yrs please stop smoking, stop any regular Alcohol  and or any Recreational drug use.  Wear Seat belts while driving.  Do not drive if taking any narcotic, mind altering or controlled substances or recreational drugs or alcohol.     1)Patient scheduled for outpatient Remdesivir infusion at 11:30 on Wednesday, 1/20, Thursday, 1/21, and Friday, 1/22.   Please advise them to report to [facility] at [address]  Drive to the security guard and tell them you are here for an infusion. They will direct you to the front entrance where we will come and get you.  For questions call [phone].  2)Avoid  ibuprofen/Advil/Aleve/Motrin/Goody Powders/Naproxen/BC powders/Meloxicam/Diclofenac/Indomethacin and other Nonsteroidal anti-inflammatory medications as these will make you more likely to bleed and can cause stomach ulcers, can also cause Kidney problems.   3)insulin aspart (novoLOG) injection 0-10 Units  0-10 Units Subcutaneous, 3 times daily with meals  CBG < 70: Implement Hypoglycemia Standing Orders and refer to Hypoglycemia Standing Orders sidebar report   CBG 70 - 120: 0 unit CBG 121 - 150: 0 unit  CBG 151 - 200: 1  unit  CBG 201 - 250: 2 units  CBG 251 - 300: 4 units  CBG 301 - 350: 6 units   CBG 351 - 400: 8 units  CBG > 400: 10 units  4)Please check CBC and CMP blood test every Friday starting 04/06/2019  5) outpatient follow-up with general surgeon Dr. Arnoldo Morale in 1 to 2 weeks  6) You are strongly advised to  to isolate/quarantine for at least 21 days from the date of your diagnoses (04/01/2019) with COVID-19 infection--please always wear a mask if you have to go outside the house  7) patient has right arm PICC line placed on 03/29/2019--- please remove PICC line on 04/06/2019 after completing IV remdesivir infusions

## 2019-04-03 NOTE — Progress Notes (Signed)
Physical Therapy Treatment Patient Details Name: [name] MRN: [id number] DOB: April 21, 1928 Today's Date: 04/03/2019    History of Present Illness [name] is a 86 y.o. female with medical history significant of cecal adenocarcinoma, hypertension, urothelial carcinoma, breast cancer, COPD, prediabetes who presented to the ER with abdominal pain.  Patient was recently admitted here with a partial small bowel obstruction from 03/08/2019-03/12/2019.  She was discharged after tolerating oral feeds and having multiple bowel movements.  She states she has had recurrent symptoms in the last 24 hours with increased abdominal pain, distention, nausea, reduced appetite.  Unable to tolerate anything p.o. in the last 24 hours.  Feels really weak and dehydrated.  Contacted her PCP who recommended coming back to the ER.  Lives alone and is usually independent at baseline.  Currently having difficulty caring for herself because of weakness.    PT Comments    Patient presents seated in chair (assisted by nursing staff) and agreeable for therapy.  Patient requires frequent rest breaks while completing BLE ROM/strengthening exercises, unable to complete sit to stands or transferring with RW due to BLE weakness and required Max assist stand pivot with knees blocked to put back in bed.  Patient will benefit from continued physical therapy in hospital and recommended venue below to increase strength, balance, endurance for safe ADLs and gait.    Follow Up Recommendations  SNF     Equipment Recommendations  None recommended by PT    Recommendations for Other Services       Precautions / Restrictions Precautions Precautions: Fall Restrictions Weight Bearing Restrictions: No    Mobility  Bed Mobility Overal bed mobility: Needs Assistance Bed Mobility: Sit to Supine       Sit to supine: Mod assist   General bed mobility comments: Patient presents seated in chair (assisted by nursing  staff).  Patient required assistance to move BLE back on to bed during sit to supine  Transfers Overall transfer level: Needs assistance Equipment used: 1 person hand held assist Transfers: Sit to/from [employer] Transfers Sit to Stand: Max assist Stand pivot transfers: Max assist       General transfer comment: Patient unable to stand using RW due to BLE weakness and required Max assist stand pivot with knees blocked to go back to bed  Ambulation/Gait                 Stairs             Wheelchair Mobility    Modified Rankin (Stroke Patients Only)       Balance Overall balance assessment: Needs assistance Sitting-balance support: Feet supported;No upper extremity supported Sitting balance-Leahy Scale: Fair Sitting balance - Comments: seated at EOB   Standing balance support: During functional activity;Bilateral upper extremity supported Standing balance-Leahy Scale: Poor Standing balance comment: unable to maintain standing balance due to weakness                            Cognition Arousal/Alertness: Awake/alert Behavior During Therapy: WFL for tasks assessed/performed Overall Cognitive Status: Within Functional Limits for tasks assessed                                        Exercises General Exercises - Lower Extremity Long Arc Quad: Seated;AROM;Strengthening;Both;10 reps Hip Flexion/Marching: Seated;AROM;Strengthening;Both;10 reps Toe Raises: Seated;AROM;Strengthening;Both;10 reps Heel Raises:  Seated;AROM;Strengthening;Both;10 reps    General Comments        Pertinent Vitals/Pain Pain Assessment: No/denies pain    Home Living                      Prior Function            PT Goals (current goals can now be found in the care plan section) Acute Rehab PT Goals Patient Stated Goal: return home with family/friends to assist PT Goal Formulation: With patient Time For Goal Achievement:  04/13/19 Potential to Achieve Goals: Fair Progress towards PT goals: Progressing toward goals    Frequency    Min 3X/week      PT Plan Current plan remains appropriate    Co-evaluation              AM-PAC PT "6 Clicks" Mobility   Outcome Measure  Help needed turning from your back to your side while in a flat bed without using bedrails?: A Little Help needed moving from lying on your back to sitting on the side of a flat bed without using bedrails?: A Lot Help needed moving to and from a bed to a chair (including a wheelchair)?: A Lot Help needed standing up from a chair using your arms (e.g., wheelchair or bedside chair)?: A Lot Help needed to walk in hospital room?: Total Help needed climbing 3-5 steps with a railing? : Total 6 Click Score: 11    End of Session   Activity Tolerance: Patient tolerated treatment well;Patient limited by fatigue Patient left: in bed;with call bell/phone within reach Nurse Communication: Mobility status PT Visit Diagnosis: Other abnormalities of gait and mobility (R26.89);Muscle weakness (generalized) (M62.81);Unsteadiness on feet (R26.81)     Time: 4917-9150 PT Time Calculation (min) (ACUTE ONLY): 28 min  Charges:  $Therapeutic Exercise: 8-22 mins $Therapeutic Activity: 8-22 mins                     4:05 PM, 04/03/19 Lonell Grandchild, MPT Physical Therapist with [facility] 336 [phone] office [phone] mobile phone

## 2019-04-03 NOTE — TOC Transition Note (Signed)
Transition of Care [facility]) - CM/SW Discharge Note   Patient Details  Name: [name] MRN: [id number] Date of Birth: 1929/01/10  [facility] CM/SW Contact:  [place], LCSW Phone Number: 04/03/2019, 1:34 PM   Clinical Narrative:   CSW discharging to SNF, [facility] for short term rehab. Patient is being treated for pneumonia associated with COVID. Patient scheduled for outpatient Remdesivir infusion at 11:30 on Wednesday, 1/20, Thursday, 1/21, and Friday, 1/22. CSW has arranged transportation to and from these infusions with [facility] PH: [phone].   CSW has also submitted [facility] transportation authorization form and is awaiting call back concerning this matter.   CSW will continue following this patient for discharge related needs. [place] [facility]  Clinical Social Worker  Ph: [phone]   Final next level of care: Skilled Nursing Facility Barriers to Discharge: SNF Pending transportation, Insurance Authorization   Patient Goals and CMS Choice Patient states their goals for this hospitalization and ongoing recovery are:: to discharge to snf for short term rehab CMS Medicare.gov Compare Post Acute Care list provided to:: Patient Choice offered to / list presented to : Patient  Discharge Placement              Patient chooses bed at: [facility] Patient to be transferred to facility by: [facility] Name of family member notified: Matt Holmes Patient and family notified of of transfer: 04/03/19  Discharge Plan and Services                          HH Arranged: (P) Refused HH          Social Determinants of Health (SDOH) Interventions     Readmission Risk Interventions Readmission Risk Prevention Plan 04/03/2019 03/12/2019  Transportation Screening Complete Complete  Medication Review Press photographer) Complete Complete  PCP or Specialist appointment within 3-5 days of  discharge Complete Complete  HRI or Home Care Consult Patient refused Patient refused  SW Recovery Care/Counseling Consult Patient refused Patient refused  Palliative Care Screening Not Applicable Not [place] Complete Not Applicable  Some recent data might be hidden

## 2019-04-03 NOTE — Discharge Summary (Addendum)
[name], is a 86 y.o. female  DOB 11-23-1928  MRN [id number].  Admission date:  03/21/2019  Admitting Physician  Murlean Iba, MD  Discharge Date:  04/03/2019   Primary MD  Kathyrn Drown, MD  Recommendations for primary care physician for things to follow:   1)Patient scheduled for outpatient Remdesivir infusion at 11:30 on Wednesday, 1/20, Thursday, 1/21, and Friday, 1/22.   Please advise them to report to [facility] at [address].  Drive to the security guard and tell them you are here for an infusion. They will direct you to the front entrance where we will come and get you.  For questions call [phone].  2)Avoid ibuprofen/Advil/Aleve/Motrin/Goody Powders/Naproxen/BC powders/Meloxicam/Diclofenac/Indomethacin and other Nonsteroidal anti-inflammatory medications as these will make you more likely to bleed and can cause stomach ulcers, can also cause Kidney problems.   3)insulin aspart (novoLOG) injection 0-10 Units  0-10 Units Subcutaneous, 3 times daily with meals  CBG < 70: Implement Hypoglycemia Standing Orders and refer to Hypoglycemia Standing Orders sidebar report   CBG 70 - 120: 0 unit CBG 121 - 150: 0 unit  CBG 151 - 200: 1 unit  CBG 201 - 250: 2 units  CBG 251 - 300: 4 units  CBG 301 - 350: 6 units   CBG 351 - 400: 8 units  CBG > 400: 10 units  4)Please check CBC and CMP blood test every Friday starting 04/06/2019  5) outpatient follow-up with general surgeon Dr. Arnoldo Morale in 1 to 2 weeks  6) You are strongly advised to  to isolate/quarantine for at least 21 days from the date of your diagnoses (04/01/2019) with COVID-19 infection--please always wear a mask if you have to go outside the house  7)Patient has right arm PICC line placed on 03/29/2019--- please remove PICC line on 04/06/2019 after completing IV remdesivir infusions   Admission Diagnosis   Guaiac positive stools [R19.5] Nausea vomiting and diarrhea [R11.2, R19.7] Gastrointestinal hemorrhage, unspecified gastrointestinal hemorrhage type [K92.2] Anemia, unspecified type [D64.9]   Discharge Diagnosis  Guaiac positive stools [R19.5] Nausea vomiting and diarrhea [R11.2, R19.7] Gastrointestinal hemorrhage, unspecified gastrointestinal hemorrhage type [K92.2] Anemia, unspecified type [D64.9]    Principal Problem:   Pneumonia due to COVID-19 virus Active Problems:   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   Palliative care by specialist   Recurrent partial small bowel obstruction   Acute respiratory disease due to COVID-19 virus   COPD (chronic obstructive pulmonary disease) ([place])   Iron deficiency anemia due to chronic blood loss   S/P colon resection   DNR (do not resuscitate)   Encounter for hospice care discussion   Carotid artery disease ([place])   Hyperlipidemia   Peripheral neuropathy   Prediabetes   CKD (chronic kidney disease), stage IV ([place])   Urothelial carcinoma of left distal ureter ([place])   Lower GI bleed from cecal cancer   Guaiac positive stools   Goals of care, counseling/discussion   Anemia   Gastrointestinal hemorrhage  Nausea vomiting and diarrhea   Small bowel obstruction (HCC)      Past Medical History:  Diagnosis Date  . Breast cancer ([place])   . Breast cancer, right breast ([place]) 05/01/2015  . Cancer (HCC)    BREAST CANCER   . Colon polyps   . COPD (chronic obstructive pulmonary disease) ([place])    DR. [place] Bells  . Diabetes mellitus without complication ([place])    PREDIABETIC  CONTROL W/ DIET  . Hypercholesteremia   . Hypertension   . Lung nodules   . Osteoarthritis   . Osteopenia 09/18/2013  . Osteoporosis   . Personal history of radiation therapy   . PONV (postoperative nausea and vomiting)    ' i get so sick with anesthesia"   . Shortness of breath dyspnea    W/ EXERTION  . Tick bite    3 years ago     Past Surgical  History:  Procedure Laterality Date  . ABDOMINAL HYSTERECTOMY    . APPENDECTOMY    . BARTHOLIN GLAND CYST EXCISION    . BIOPSY  12/06/2018   Procedure: BIOPSY;  Surgeon: Rogene Houston, MD;  [facility];  Service: Endoscopy;;  cecal mass biopsies   . BOWEL RESECTION N/A 03/27/2019   Procedure: PARTIAL SMALL BOWEL RESECTION;  Surgeon: Aviva Signs, MD;  Location: [facility];  Service: General;  Laterality: N/A;  . BREAST EXCISIONAL BIOPSY Left    benign  . BREAST LUMPECTOMY WITH RADIOACTIVE SEED LOCALIZATION Right 05/23/2015   Procedure: RIGHT BREAST LUMPECTOMY WITH RADIOACTIVE SEED LOCALIZATION;  Surgeon: Fanny Skates, MD;  Location: [facility];  Service: General;  Laterality: Right;  . BREAST SURGERY     2000  LEFT BREAST  LUMP REMOVED  . cataract surgeries  Bilateral   . COLON RESECTION N/A 12/08/2018   Procedure: OPEN SEGMENTAL COLON RESECTION;  Surgeon: Fanny Skates, MD;  Location: [facility];  Service: General;  Laterality: N/A;  . colonoscopy    . COLONOSCOPY  03/25/2011   Procedure: COLONOSCOPY;  Surgeon: Rogene Houston, MD;  [facility];  Service: Endoscopy;  Laterality: N/A;  9:30 am  . COLONOSCOPY N/A 12/06/2018   Procedure: COLONOSCOPY;  Surgeon: Rogene Houston, MD;  [facility];  Service: Endoscopy;  Laterality: N/A;  . CYSTOSCOPY W/ URETERAL STENT PLACEMENT Left 02/27/2018   Procedure: CYSTOSCOPY WITH LEFT RETROGRADE PYELOGRAM AND lEFT URETEROSCOPY WITH BIOPSY AND LEFT URETERAL STENT PLACEMENT;  Surgeon: Cleon Gustin, MD;  Location: [facility];  Service: Urology;  Laterality: Left;  . CYSTOSCOPY W/ URETERAL STENT PLACEMENT Right 12/08/2018   Procedure: CYSTOSCOPY WITH RETROGRADE PYELOGRAM/URETERAL STENT PLACEMENT;  Surgeon: Raynelle Bring, MD;  [facility];  Service: Urology;  Laterality: Right;  . left inguinal hernia repair    . LIVER BIOPSY N/A 03/27/2019   Procedure: LIVER BIOPSY;  Surgeon: Aviva Signs, MD;  Location[facility];  Service:  General;  Laterality: N/A;  . multiple toe surgeries    . ROBOT ASSITED LAPAROSCOPIC NEPHROURETERECTOMY Left 05/11/2018   Procedure: XI ROBOT ASSITED LAPAROSCOPIC LEFT DISTAL URETERECTOMY, URETERAL  REIMPLANTATION, WITH PSOAS HITCH AND BOARI FLAP. PELVIC LYMPH NODE DISSCETION AND LEFT URETERAL STENT PLACEMENT;  Surgeon: Cleon Gustin, MD;  Location: [facility];  Service: Urology;  Laterality: Left;  . TONSILLECTOMY       HPI  from the history and physical done on the day of admission:    Chief Complaint: weakness   HPI: [name] is a 86 y.o.  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  She reports that she has been having difficulty eating and drinking and having nausea vomiting and diarrhea.  The patient reports that she has been having difficulty with ADLs at home because she has been so weak.  She has been too weak to go to see her outpatient provider.  She returned to the emergency department because she did not know what else to do.  She has some cramping in her abdomen but is not having the abdominal bloating and distention that she had when she was diagnosed with SBO.       ED Course: Patient reports that she feels very weak.  Patient has a temperature of 97.3, pulse 82, blood pressure 122/63, pulse ox 98% on room air.  Patient was sent for abdominal x-rays but there were no findings of small bowel obstruction and there was a  normal bowel gas pattern.  Patient was strongly Hemoccult positive.  Her hemoglobin has drifted down in the last 2 weeks to 7.7 down from 10.1 in late December 2020.  Patient was given a bolus of IV fluids and GI was consulted.    Hospital Course:    Brief Admission Hx: 86 y.o.femalewith medical history significantfor 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 12/08/2018 that showed moderately differentiated adenocarcinoma with clear margins, several recent admissions for small bowel obstruction, stage III CKD, chronic hypoxic respiratory failure, hypertension, COPD, history of urothelial carcinoma involving the left distal ureter status post resection, history of right breast cancer who was readmitted with SBO on 03/21/2019 -Underwent Exploratory laparotomy, partial small bowel resection, wedge liver biopsy on 03/27/2019 -Tested negative for COVID-19 on 03/08/2019, again negative on 03/15/2019 and again negative on 03/21/2019 -Covid testing on 03/31/2018 prior to transfer to SNF came back positive--- chest x-ray with Covid pneumonia and patient has hypoxia and dyspnea Patient tested positive for COVID-19, general surgeon Dr. Arnoldo Morale requesting transfer back to hospitalist service for management of Covid pneumonia on 04/02/19   A/p  1)Acute on Chronic  hypoxic respiratory failure secondary to COVID-19 infection/pneumonia--- The treatment plan and use of medications for treatment of COVID-19 infectionand possible side effects were discussed with patient,   --Patient verbalizes understanding and agrees to treatment protocols  --Patient is positive for COVID-19 infection, chest x-ray with findings of infiltrates/opacities,  patient is hypoxic and requiring continuous supplemental oxygen---patient meets criteria for initiation of Remdesivir AND Decadron/Steroid therapy per protocol -- Patient was started on remdesivir and Decadron on 04/02/2019, she is scheduled to continue  remdesivir IV at [facility] infusion center through 04/06/2019 -Elevated inflammatory markers noted including -Ferritin down to 798 from 918, D-dimer down to 9.54 from 9.73, CRP down to 19.0 from 20.5 -WBC is 8.7 --Check and trend fibrinogen, CRP, pro calcitonin, CBC, BMP, d-dimer, LDH, ferritin and LFTs --Supplemental oxygen to keep O2 sats above 93% -Follow serial chest x-rays and ABGs as indicated --- Encourage prone positioning for More than 16 hours/day in increments of 2 to 3 hours at a time if able to tolerate --Attempt  to maintain euvolemic state --Zinc and vitamin C as ordered -Albuterol inhaler as needed -Covid test is positive on 03/31/2018, repeat Covid test on 04/01/2018 is positive  2)Recurrent partial SBO -  S/p Exploratory laparotomy, partial small bowel resection, wedge liver biopsy on 03/27/2019.Path reveals metastatic adenocarcinoma involving small bowel and omentum. It appears to be due to colon primary and not urothelial--- patient previously declined hospice involvement -Tolerating soft diet well, having BMs -Outpatient follow-up with surgeon as advised  3) chronic iron deficiency anemia in the setting of colon malignancy and guaiac positive stools/chronic GI blood loss --- discharge hemoglobin 11.5, , no evidence of significant bleeding at this time  4)CKD stage IV -currently stable with creatinine around 2.0,, renally adjust medications, avoid nephrotoxic agents / dehydration  / hypotension -Discharge creatinine 2.1  5)Social/Ethics--- DNR on admission-palliative medicine consult  for end-of-life discussions appreciated  -States that she may consider hospice in the future  6)Hypomagnesemia/hypokalemia --- Magnesium is up to 2.0 from 1.5 after replacement   7) generalized weakness/deconditioning/prolonged hospitalization----physical therapy evaluation appreciated transfer to SNF rehab  Disposition--discharged to SNF for rehab with outpatient --iv remdesivir  at [facility] through 04/06/2019 inclusive,   Code Status : DNR  Family Communication:    (patient is alert, awake and coherent)  POA--- [name]-- [phone]--- left voicemail x 2   Disposition Plan  : SNF Rehab  Consults  :  Gen Surgery/Gi//Palliative  Discharge Condition: Stable  Follow UP  Follow-up Information    Kathyrn Drown, MD. Schedule an appointment as soon as possible for a visit in 2 week(s).   Specialty: Family Medicine Contact information: [place] [address] [phone]            Diet and Activity recommendation:  As advised  Discharge Instructions    Discharge Instructions    Call MD for:  difficulty breathing, headache or visual disturbances   Complete by: As directed    Call MD for:  extreme fatigue   Complete by: As directed    Call MD for:  persistant dizziness or light-headedness   Complete by: As directed    Call MD for:  persistant nausea and vomiting   Complete by: As directed    Call MD for:  severe uncontrolled pain   Complete by: As directed    Call MD for:  temperature >100.4   Complete by: As directed    Diet - low sodium heart healthy   Complete by: As directed    Soft diet advised   Discharge instructions   Complete by: As directed    1)Patient scheduled for outpatient Remdesivir infusion at 11:30 on Wednesday, 1/20, Thursday, 1/21, and Friday, 1/22.   Please advise them to report to [facility] at [address]  Drive to the security guard and tell them you are here for an infusion. They will direct you to the front entrance where we will come and get you.  For questions call [phone].  2)Avoid ibuprofen/Advil/Aleve/Motrin/Goody Powders/Naproxen/BC powders/Meloxicam/Diclofenac/Indomethacin and other Nonsteroidal anti-inflammatory medications as these will make you more likely to bleed and can cause stomach ulcers, can also cause Kidney problems.   3)insulin aspart (novoLOG)  injection 0-10 Units  0-10 Units Subcutaneous, 3 times daily with meals  CBG < 70: Implement Hypoglycemia Standing Orders and refer to Hypoglycemia Standing Orders sidebar report   CBG 70 - 120: 0 unit CBG 121 - 150: 0 unit  CBG 151 - 200: 1 unit  CBG 201 - 250: 2 units  CBG 251 - 300: 4 units  CBG 301 - 350: 6 units   CBG 351 - 400: 8 units  CBG > 400: 10 units  4)Please check CBC and CMP blood test every Friday starting 04/06/2019  5) outpatient follow-up with general surgeon Dr. Arnoldo Morale in 1 to 2 weeks  6) You are strongly advised to  to isolate/quarantine for at least 21 days from the date of your diagnoses (04/01/2019) with COVID-19 infection--please always wear a mask if you have to go outside the house  7) patient has right arm PICC line placed on 03/29/2019--- please remove PICC line on 04/06/2019 after completing IV remdesivir infusions   Increase activity slowly   Complete by: As directed         Discharge Medications     Allergies as of 04/03/2019      Reactions   Augmentin [amoxicillin-pot Clavulanate] Nausea And Vomiting   Has patient had a PCN reaction causing immediate rash, facial/tongue/throat swelling, SOB or lightheadedness with hypotension: No Has patient had a PCN reaction causing severe rash involving mucus membranes or skin necrosis: No Has patient had a PCN reaction that required hospitalization: No Has patient had a PCN reaction occurring within the last 10 years: Unknown If all of the above answers are "NO", then may proceed with Cephalosporin use.   Amoxicillin Diarrhea   Has patient had a PCN reaction causing immediate rash, facial/tongue/throat swelling, SOB or lightheadedness with hypotension: No Has patient had a PCN reaction causing severe rash involving mucus membranes or skin necrosis: No Has patient had a PCN reaction that required hospitalization: No Has patient had a PCN reaction occurring within the last 10 years: Unknown If all of the above  answers are "NO", then may proceed with Cephalosporin use.   Codeine Nausea And Vomiting   Morphine Nausea And Vomiting   Neomycin-bacitracin Zn-polymyx Itching, Rash   Unable to use Neosporin      Medication List    STOP taking these medications   furosemide 20 MG tablet Commonly known as: LASIX   potassium chloride 10 MEQ tablet Commonly known as: KLOR-CON     TAKE these medications   acetaminophen 325 MG tablet Commonly known as: TYLENOL Take 2 tablets (650 mg total) by mouth every 6 (six) hours as needed for mild pain (or Fever >/= 101).   amLODipine 5 MG tablet Commonly known as: NORVASC TAKE ONE TABLET BY MOUTH ONCE DAILY.   anastrozole 1 MG tablet Commonly known as: ARIMIDEX Take 1 tablet (1 mg total) by mouth daily. What changed: when to take this   ascorbic acid 500 MG tablet Commonly known as: VITAMIN C Take 1 tablet (500 mg total) by mouth daily. Start taking on: April 04, 2019   dexamethasone 6 MG tablet Commonly known as: Decadron Take 1 tablet (6 mg total) by mouth daily with breakfast for 5 days.   fluticasone 50 MCG/ACT nasal spray Commonly known as: FLONASE Place 2 sprays into both nostrils daily as needed for allergies.   guaiFENesin 600 MG 12 hr tablet Commonly known as: Mucinex Take 1 tablet (600 mg total) by mouth 2 (two) times daily for 10 days.   guaiFENesin-dextromethorphan 100-10 MG/5ML syrup Commonly known as: ROBITUSSIN DM Take 10 mLs by mouth every 4 (four) hours as needed for cough.   hydrocortisone 2.5 % rectal cream Commonly known as: ANUSOL-HC Place 1 application rectally 2 (two) times daily as needed for hemorrhoids or anal itching.  insulin aspart 100 UNIT/ML FlexPen Commonly known as: NOVOLOG Inject 0-10 Units into the skin 3 (three) times daily with meals. insulin aspart (novoLOG) injection 0-10 Units 0-10 Units Subcutaneous, 3 times daily with meals CBG < 70: Implement Hypoglycemia Standing Orders and refer to  Hypoglycemia Standing Orders sidebar report  CBG 70 - 120: 0 unit CBG 121 - 150: 0 unit  CBG 151 - 200: 1 unit CBG 201 - 250: 2 units CBG 251 - 300: 4 units CBG 301 - 350: 6 units  CBG 351 - 400: 8 units  CBG > 400: 10 units   levocetirizine 5 MG tablet Commonly known as: XYZAL Take 5 mg by mouth daily as needed for allergies.   multivitamin with minerals tablet Take 1 tablet by mouth daily.   nortriptyline 10 MG capsule Commonly known as: PAMELOR Take 1-2 capsules (10-20 mg total) by mouth at bedtime. What changed: See the new instructions.   ondansetron 4 MG tablet Commonly known as: ZOFRAN Take 1 tablet (4 mg total) by mouth every 6 (six) hours as needed for nausea.   OXYGEN Inhale 2 L into the lungs. As needed for activities requiring exertion, per patient uses very sparingly   pantoprazole 40 MG tablet Commonly known as: Protonix Take 1 tablet (40 mg total) by mouth daily.   ProAir RespiClick 093 (90 Base) MCG/ACT Aepb Generic drug: Albuterol Sulfate INHALE 1 PUFF INTO THE LUNGS EVERY 4 HOURS AS NEEDED. What changed: See the new instructions.   senna-docusate 8.6-50 MG tablet Commonly known as: Senokot-S Take 2 tablets by mouth at bedtime.   traMADol 50 MG tablet Commonly known as: ULTRAM Take 1 tablet (50 mg total) by mouth every 6 (six) hours as needed for up to 5 days (mild pain).   zinc sulfate 220 (50 Zn) MG capsule Take 1 capsule (220 mg total) by mouth daily. Start taking on: April 04, 2019       Major procedures and Radiology Reports - PLEASE review detailed and final reports for all details, in brief -    CT ABDOMEN PELVIS WO CONTRAST  Result Date: 03/15/2019 CLINICAL DATA:  Follow-up small-bowel obstruction EXAM: CT ABDOMEN AND PELVIS WITHOUT CONTRAST TECHNIQUE: Multidetector CT imaging of the abdomen and pelvis was performed following the standard protocol without IV contrast. COMPARISON:  03/08/2019 FINDINGS: Lower chest: No acute abnormality.  Hepatobiliary: Gallbladder is partially contracted. Stable appearance of scattered low-density lesions throughout the liver including a subcapsular lesion at the posterior aspect of the right hepatic lobe containing dystrophic calcification. No definite new hepatic lesion identified. No biliary dilatation. Pancreas: Unremarkable. No pancreatic ductal dilatation or surrounding inflammatory changes. Spleen: Normal in size without focal abnormality. Adrenals/Urinary Tract: Adrenal glands are unremarkable. Kidneys are within normal limits without focal lesion or hydronephrosis. There are renal vascular calcifications. Bladder is unremarkable. Stomach/Bowel: Persistent small bowel obstruction with transition point in the right hemipelvis (series 6, images 30-34). The degree of bowel dilatation is similar to the previous exam. By the colon remains largely collapsed. Stomach is mildly distended as well. Vascular/Lymphatic: Aortic atherosclerosis. No enlarged abdominal or pelvic lymph nodes. Reproductive: Status post hysterectomy. No adnexal masses. Other: No ascites. No pneumoperitoneum. No abdominal wall hernia. Musculoskeletal: No new or acute osseous findings. IMPRESSION: 1. Persistent small bowel obstruction with transition point in the right hemipelvis, likely secondary to adhesion. No free fluid or free air to suggest perforation. 2. Additional chronic findings, as above. Aortic Atherosclerosis (ICD10-I70.0). Electronically Signed   By: Davina Poke D.O.  On: 03/15/2019 18:43   CT ABDOMEN PELVIS WO CONTRAST  Result Date: 03/08/2019 CLINICAL DATA:  Abdominal pain, nausea, and vomiting for a week, history breast cancer, COPD, hypertension, diabetes mellitus EXAM: CT ABDOMEN AND PELVIS WITHOUT CONTRAST TECHNIQUE: Multidetector CT imaging of the abdomen and pelvis was performed following the standard protocol without IV contrast. Sagittal and coronal MPR images reconstructed from axial data set. No oral  contrast was administered. COMPARISON:  12/04/2018 FINDINGS: Lower chest: Chronic atelectasis versus scarring at RIGHT lower lobe. Remaining lung bases clear. Hepatobiliary: Gallbladder unremarkable. Multiple low-attenuation foci within liver likely representing small cysts. Additional cystic lesion with dystrophic calcification 2.6 x 2.4 cm image 17, posterior RIGHT lobe liver, unchanged. Pancreas: Normal appearance Spleen: Normal appearance Adrenals/Urinary Tract: Thickening of adrenal glands without discrete mass. Renal vascular calcifications. No definite renal mass or hydronephrosis. Bladder and ureters unremarkable. Stomach/Bowel: Prior ileocolic resection. Stomach unremarkable. Colon decompressed. Dilated proximal and decompressed distal small bowel loops compatible with small bowel obstruction. Transition from dilated to nondilated small bowel occurs in the RIGHT lower quadrant, favor adhesion. Minimal scattered edema within mesentery. No definite bowel wall thickening. Vascular/Lymphatic: Atherosclerotic calcifications aorta and iliac arteries. Aorta normal caliber. No adenopathy. Reproductive: Uterus surgically absent. Nonvisualization of ovaries. Other: No free air or free fluid.  No hernia. Musculoskeletal: Osseous demineralization. IMPRESSION: Distal small bowel obstruction, suspect related to adhesion in the RIGHT upper pelvis. Stable hepatic cysts including a 2.6 x 2.4 cm cyst with dystrophic calcification at the posterior RIGHT lobe liver. Aortic Atherosclerosis (ICD10-I70.0). Electronically Signed   By: Lavonia Dana M.D.   On: 03/08/2019 18:58   DG Abd 1 View  Result Date: 03/26/2019 CLINICAL DATA:  Small-bowel obstruction EXAM: ABDOMEN - 1 VIEW COMPARISON:  March 25, 2019 FINDINGS: There remains mild air-filled dilated loops of small bowel seen within the mid abdomen measuring up to 4.4 cm. There is air seen to the level of the rectum. No acute osseous abnormality. IMPRESSION: No significant  change in findings suggestive of partial small bowel obstruction Electronically Signed   By: Prudencio Pair M.D.   On: 03/26/2019 06:04   DG CHEST PORT 1 VIEW  Result Date: 04/02/2019 CLINICAL DATA:  Acute respiratory disease due to COVID-19. EXAM: PORTABLE CHEST 1 VIEW COMPARISON:  March 15, 2019 FINDINGS: The heart size borderline. The hila and mediastinum are normal. Right PICC line terminates in the SVC. No pneumothorax. Increased interstitial opacities are seen in both lungs, more focal in the right upper lobe in the left base. There is probably a small effusion on the left. No other acute abnormalities. IMPRESSION: Bilateral diffuse interstitial opacities with more focal opacity in the right upper lobe in left base are favored to represent sequela of the patient's COVID-19 status. Pulmonary edema could have a similar appearance. Recommend clinical correlation. Electronically Signed   By: Dorise Bullion III M.D   On: 04/02/2019 13:05   DG Chest Portable 1 View  Result Date: 03/15/2019 CLINICAL DATA:  NG tube placement EXAM: PORTABLE CHEST 1 VIEW COMPARISON:  03/08/2019 FINDINGS: Suspected skin fold artifact over the right upper thorax. Clips at the right chest. No acute consolidation, pleural effusion or pneumothorax. Stable cardiomediastinal silhouette with aortic atherosclerosis. Esophageal tube tip is below the diaphragm but incompletely visualized. IMPRESSION: 1. Esophageal tube tip below the diaphragm but incompletely visualized. 2. No acute airspace disease. Electronically Signed   By: Donavan Foil M.D.   On: 03/15/2019 21:53   DG Chest Port 1 View  Result Date: 03/08/2019  CLINICAL DATA:  Abdominal pain with nausea and vomiting. EXAM: PORTABLE CHEST 1 VIEW COMPARISON:  Chest x-ray dated 03/28/2018 FINDINGS: The heart size and pulmonary vascularity are normal. Aortic atherosclerosis. The lungs are clear. No significant bone abnormality. IMPRESSION: 1. No active disease in the chest. 2.   Aortic Atherosclerosis (ICD10-I70.0). Electronically Signed   By: Lorriane Shire M.D.   On: 03/08/2019 16:28   DG Abd 2 Views  Result Date: 03/25/2019 CLINICAL DATA:  Small bowel obstruction. EXAM: ABDOMEN - 2 VIEW COMPARISON:  03/24/2019 FINDINGS: There is moderate dilatation of multiple small bowel loops without significant interval change. No intraperitoneal free air is identified. There is mild lumbar scoliosis. IMPRESSION: Unchanged small bowel dilatation consistent with obstruction. Electronically Signed   By: Logan Bores M.D.   On: 03/25/2019 07:54   Acute Abd Series  Result Date: 03/21/2019 CLINICAL DATA:  Nausea, vomiting, diarrhea, abdominal pain EXAM: DG ABDOMEN ACUTE W/ 1V CHEST COMPARISON:  03/09/2019 FINDINGS: There is no evidence of dilated bowel loops or free intraperitoneal air. Heart size and mediastinal contours are within normal limits. Both lungs are clear. IMPRESSION: Negative abdominal radiographs.  No acute cardiopulmonary disease. Electronically Signed   By: Macy Mis M.D.   On: 03/21/2019 08:52   DG ABD ACUTE 2+V W 1V CHEST  Result Date: 03/09/2019 CLINICAL DATA:  Abdominal pain with nausea and vomiting. EXAM: DG ABDOMEN ACUTE W/ 1V CHEST COMPARISON:  Chest x-ray 03/08/2019 FINDINGS: Lungs are adequately inflated without focal airspace consolidation or effusion. Cardiomediastinal silhouette and remainder of the chest is unchanged. Abdominopelvic images demonstrate a nasogastric tube with tip over the stomach in the left upper quadrant. Bowel gas pattern is nonobstructive. There is no free peritoneal air. There are degenerative changes of the spine and hips. IMPRESSION: Negative abdominal radiographs.  No acute cardiopulmonary disease. Nasogastric tube with tip over the stomach in the left upper quadrant. Electronically Signed   By: Marin Olp M.D.   On: 03/09/2019 10:05   DG Abd Portable 1V  Result Date: 03/24/2019 CLINICAL DATA:  History of colonic resection  September 2020. History of small-bowel obstructions. History of breast cancer. EXAM: PORTABLE ABDOMEN - 1 VIEW COMPARISON:  March 21, 2019 FINDINGS: Dilated loops of small bowel are identified consistent with small-bowel obstruction. Evaluation for free air is limited due to the lack of upright imaging or decubitus imaging. IMPRESSION: Small bowel obstruction. Evaluation for free air is limited as above. Electronically Signed   By: Dorise Bullion III M.D   On: 03/24/2019 13:06   [place] EKG SITE RITE  Result Date: 03/29/2019 If Site Rite image not attached, placement could not be confirmed due to current cardiac rhythm.   Micro Results    Recent Results (from the past 240 hour(s))  Surgical pcr screen     Status: None   Collection Time: 03/26/19  6:46 PM   Specimen: Nasal Mucosa; Nasal Swab  Result Value Ref Range Status   MRSA, PCR NEGATIVE NEGATIVE Final   Staphylococcus aureus NEGATIVE NEGATIVE Final    Comment: (NOTE) The Xpert SA Assay (FDA approved for NASAL specimens in patients 54 years of age and older), is one component of a comprehensive surveillance program. It is not intended to diagnose infection nor to guide or monitor treatment. Performed at [facility], [address] [place], [place] [address]   SARS CORONAVIRUS 2 (TAT 6-24 HRS) Nasopharyngeal Nasopharyngeal Swab     Status: Abnormal   Collection Time: 04/01/19  1:10 PM   Specimen:  Nasopharyngeal Swab  Result Value Ref Range Status   SARS Coronavirus 2 POSITIVE (A) NEGATIVE Final    Comment: RESULT CALLED TO, READ BACK BY AND VERIFIED WITH: R. BONDURANT,RN 0301 04/02/2019 T. TYSOR (NOTE) SARS-CoV-2 target nucleic acids are DETECTED. The SARS-CoV-2 RNA is generally detectable in upper and lower respiratory specimens during the acute phase of infection. Positive results are indicative of the presence of SARS-CoV-2 RNA. Clinical correlation with patient history and other diagnostic information is  necessary to  determine patient infection status. Positive results do not rule out bacterial infection or co-infection with other viruses.  The expected result is Negative. Fact Sheet for Patients: SugarRoll.be Fact Sheet for Healthcare Providers: https://www.woods-mathews.com/ This test is not yet approved or cleared by the [place] FDA and  has been authorized for detection and/or diagnosis of SARS-CoV-2 by FDA under an Emergency Use Authorization (EUA). This EUA will remain  in effect (meaning this test can be used) fo r the duration of the COVID-19 declaration under Section 564(b)(1) of the Act, 21 U.S.C. section 360bbb-3(b)(1), unless the authorization is terminated or revoked sooner. Performed at [facility], [place] [address] [place], [place] [address]   Respiratory Panel by RT PCR (Flu A&B, Covid) - Nasopharyngeal Swab     Status: Abnormal   Collection Time: 04/02/19 11:04 AM   Specimen: Nasopharyngeal Swab  Result Value Ref Range Status   SARS Coronavirus 2 by RT PCR POSITIVE (A) NEGATIVE Final    Comment: RESULT CALLED TO, READ BACK BY AND VERIFIED WITH: WRIGHT E. AT 1243 ON [id number] BY THOMPSON S.    Influenza A by PCR NEGATIVE NEGATIVE Final   Influenza B by PCR NEGATIVE NEGATIVE Final    Comment: (NOTE) The Xpert Xpress SARS-CoV-2/FLU/RSV assay is intended as an aid in  the diagnosis of influenza from Nasopharyngeal swab specimens and  should not be used as a sole basis for treatment. Nasal washings and  aspirates are unacceptable for Xpert Xpress SARS-CoV-2/FLU/RSV  testing. Fact Sheet for Patients: PinkCheek.be Fact Sheet for Healthcare Providers: GravelBags.it This test is not yet approved or cleared by the [place] FDA and  has been authorized for detection and/or diagnosis of SARS-CoV-2 by  FDA under an Emergency Use Authorization (EUA). This EUA will remain  in  effect (meaning this test can be used) for the duration of the  Covid-19 declaration under Section 564(b)(1) of the Act, 21  U.S.C. section 360bbb-3(b)(1), unless the authorization is  terminated or revoked. Performed at [facility], [address], [place], [place] [address]        Today   Subjective    [name] today has no new complaints -Tolerating oral intake well          Patient has been seen and examined prior to discharge   Objective   Blood pressure 138/65, pulse 92, temperature (!) 97.4 F (36.3 C), temperature source Oral, resp. rate 16, height 5\' 4"  (1.626 m), weight 55.8 kg, SpO2 96 %.   Intake/Output Summary (Last 24 hours) at 04/03/2019 1243 Last data filed at 04/03/2019 0900 Gross per 24 hour  Intake 4125.7 ml  Output 1200 ml  Net 2925.7 ml    Exam Gen:- Awake Alert, frail-appearing HEENT:- [place].AT, No sclera icterus Nose-  3 L/min Neck-Supple Neck,No JVD,.  Lungs-diminished in bases, no wheezing  CV- S1, S2 normal, regular  Abd-  +ve B.Sounds, Abd Soft, No tenderness, healing midline abdominal wound    Extremity/Skin:- No  edema, pedal  pulses present  Psych-affect is appropriate, oriented x3 Neuro-generalized weakness, no new focal deficits, no tremors MSK- Rt arm PICC Line   Data Review   CBC w Diff:  Lab Results  Component Value Date   WBC 8.7 04/03/2019   HGB 11.5 (L) 04/03/2019   HGB 8.3 (L) 10/09/2018   HCT 36.6 04/03/2019   HCT 26.1 (L) 10/09/2018   PLT 188 04/03/2019   PLT 373 10/09/2018   LYMPHOPCT 5 04/03/2019   MONOPCT 2 04/03/2019   EOSPCT 0 04/03/2019   BASOPCT 0 04/03/2019    CMP:  Lab Results  Component Value Date   NA 142 04/03/2019   NA 141 10/09/2018   K 3.1 (L) 04/03/2019   CL 116 (H) 04/03/2019   CO2 17 (L) 04/03/2019   BUN 39 (H) 04/03/2019   BUN 33 (H) 10/09/2018   CREATININE 2.12 (H) 04/03/2019   CREATININE 0.83 04/03/2014   PROT 4.2 (L) 04/03/2019   PROT 6.6 10/09/2018   ALBUMIN 1.3 (L)  04/03/2019   ALBUMIN 4.1 10/09/2018   BILITOT 0.4 04/03/2019   BILITOT <0.2 10/09/2018   ALKPHOS 74 04/03/2019   AST 19 04/03/2019   ALT 17 04/03/2019  .   Total Discharge time is about 33 minutes  Roxan Hockey M.D on 04/03/2019 at 12:43 PM  Go to [email or web address] -  for contact info  [facility] - Office  [phone]

## 2019-04-04 ENCOUNTER — Ambulatory Visit (HOSPITAL_COMMUNITY)
Admit: 2019-04-04 | Discharge: 2019-04-04 | Disposition: A | Discharge status: Home / Self Care | Payer: PPO | Attending: Pulmonary Disease | Admitting: Pulmonary Disease

## 2019-04-04 ENCOUNTER — Other Ambulatory Visit: Payer: Self-pay

## 2019-04-04 ENCOUNTER — Inpatient Hospital Stay (HOSPITAL_COMMUNITY)
Admission: EM | Admit: 2019-04-04 | Discharge: 2019-04-16 | DRG: 177 | Disposition: E | Payer: PPO | Attending: Family Medicine | Admitting: Family Medicine

## 2019-04-04 ENCOUNTER — Inpatient Hospital Stay (HOSPITAL_COMMUNITY): Payer: PPO

## 2019-04-04 VITALS — BP 89/55 | HR 110 | Temp 96.4°F | Resp 40

## 2019-04-04 DIAGNOSIS — M170 Bilateral primary osteoarthritis of knee: Secondary | ICD-10-CM | POA: Diagnosis present

## 2019-04-04 DIAGNOSIS — R7303 Prediabetes: Secondary | ICD-10-CM | POA: Diagnosis present

## 2019-04-04 DIAGNOSIS — N184 Chronic kidney disease, stage 4 (severe): Secondary | ICD-10-CM | POA: Diagnosis not present

## 2019-04-04 DIAGNOSIS — I779 Disorder of arteries and arterioles, unspecified: Secondary | ICD-10-CM | POA: Diagnosis not present

## 2019-04-04 DIAGNOSIS — Z515 Encounter for palliative care: Secondary | ICD-10-CM | POA: Diagnosis not present

## 2019-04-04 DIAGNOSIS — E872 Acidosis: Secondary | ICD-10-CM | POA: Diagnosis present

## 2019-04-04 DIAGNOSIS — Z9049 Acquired absence of other specified parts of digestive tract: Secondary | ICD-10-CM

## 2019-04-04 DIAGNOSIS — Z923 Personal history of irradiation: Secondary | ICD-10-CM

## 2019-04-04 DIAGNOSIS — C669 Malignant neoplasm of unspecified ureter: Secondary | ICD-10-CM | POA: Diagnosis not present

## 2019-04-04 DIAGNOSIS — K921 Melena: Secondary | ICD-10-CM | POA: Diagnosis not present

## 2019-04-04 DIAGNOSIS — J069 Acute upper respiratory infection, unspecified: Secondary | ICD-10-CM

## 2019-04-04 DIAGNOSIS — R7989 Other specified abnormal findings of blood chemistry: Secondary | ICD-10-CM | POA: Diagnosis not present

## 2019-04-04 DIAGNOSIS — I129 Hypertensive chronic kidney disease with stage 1 through stage 4 chronic kidney disease, or unspecified chronic kidney disease: Secondary | ICD-10-CM | POA: Diagnosis not present

## 2019-04-04 DIAGNOSIS — J440 Chronic obstructive pulmonary disease with acute lower respiratory infection: Secondary | ICD-10-CM | POA: Diagnosis not present

## 2019-04-04 DIAGNOSIS — L89899 Pressure ulcer of other site, unspecified stage: Secondary | ICD-10-CM | POA: Diagnosis present

## 2019-04-04 DIAGNOSIS — C787 Secondary malignant neoplasm of liver and intrahepatic bile duct: Secondary | ICD-10-CM | POA: Diagnosis present

## 2019-04-04 DIAGNOSIS — C799 Secondary malignant neoplasm of unspecified site: Secondary | ICD-10-CM | POA: Diagnosis present

## 2019-04-04 DIAGNOSIS — R0602 Shortness of breath: Secondary | ICD-10-CM | POA: Diagnosis not present

## 2019-04-04 DIAGNOSIS — E875 Hyperkalemia: Secondary | ICD-10-CM | POA: Diagnosis not present

## 2019-04-04 DIAGNOSIS — J9601 Acute respiratory failure with hypoxia: Secondary | ICD-10-CM

## 2019-04-04 DIAGNOSIS — Z66 Do not resuscitate: Secondary | ICD-10-CM | POA: Diagnosis present

## 2019-04-04 DIAGNOSIS — E785 Hyperlipidemia, unspecified: Secondary | ICD-10-CM | POA: Diagnosis present

## 2019-04-04 DIAGNOSIS — Z90710 Acquired absence of both cervix and uterus: Secondary | ICD-10-CM

## 2019-04-04 DIAGNOSIS — K922 Gastrointestinal hemorrhage, unspecified: Secondary | ICD-10-CM | POA: Diagnosis not present

## 2019-04-04 DIAGNOSIS — J449 Chronic obstructive pulmonary disease, unspecified: Secondary | ICD-10-CM | POA: Diagnosis not present

## 2019-04-04 DIAGNOSIS — C662 Malignant neoplasm of left ureter: Secondary | ICD-10-CM | POA: Diagnosis present

## 2019-04-04 DIAGNOSIS — Z800 Family history of malignant neoplasm of digestive organs: Secondary | ICD-10-CM

## 2019-04-04 DIAGNOSIS — E1122 Type 2 diabetes mellitus with diabetic chronic kidney disease: Secondary | ICD-10-CM | POA: Diagnosis present

## 2019-04-04 DIAGNOSIS — J9621 Acute and chronic respiratory failure with hypoxia: Secondary | ICD-10-CM | POA: Diagnosis present

## 2019-04-04 DIAGNOSIS — Z853 Personal history of malignant neoplasm of breast: Secondary | ICD-10-CM | POA: Diagnosis not present

## 2019-04-04 DIAGNOSIS — M810 Age-related osteoporosis without current pathological fracture: Secondary | ICD-10-CM | POA: Diagnosis present

## 2019-04-04 DIAGNOSIS — N179 Acute kidney failure, unspecified: Secondary | ICD-10-CM | POA: Diagnosis not present

## 2019-04-04 DIAGNOSIS — G9341 Metabolic encephalopathy: Secondary | ICD-10-CM | POA: Diagnosis not present

## 2019-04-04 DIAGNOSIS — I959 Hypotension, unspecified: Secondary | ICD-10-CM | POA: Diagnosis present

## 2019-04-04 DIAGNOSIS — U071 COVID-19: Principal | ICD-10-CM | POA: Diagnosis present

## 2019-04-04 DIAGNOSIS — C189 Malignant neoplasm of colon, unspecified: Secondary | ICD-10-CM | POA: Diagnosis not present

## 2019-04-04 DIAGNOSIS — C50911 Malignant neoplasm of unspecified site of right female breast: Secondary | ICD-10-CM | POA: Diagnosis present

## 2019-04-04 DIAGNOSIS — E7800 Pure hypercholesterolemia, unspecified: Secondary | ICD-10-CM | POA: Diagnosis present

## 2019-04-04 DIAGNOSIS — Z86010 Personal history of colonic polyps: Secondary | ICD-10-CM

## 2019-04-04 DIAGNOSIS — J1282 Pneumonia due to coronavirus disease 2019: Secondary | ICD-10-CM | POA: Diagnosis not present

## 2019-04-04 DIAGNOSIS — D500 Iron deficiency anemia secondary to blood loss (chronic): Secondary | ICD-10-CM | POA: Diagnosis not present

## 2019-04-04 DIAGNOSIS — E1142 Type 2 diabetes mellitus with diabetic polyneuropathy: Secondary | ICD-10-CM | POA: Diagnosis present

## 2019-04-04 DIAGNOSIS — E8809 Other disorders of plasma-protein metabolism, not elsewhere classified: Secondary | ICD-10-CM | POA: Diagnosis present

## 2019-04-04 DIAGNOSIS — C180 Malignant neoplasm of cecum: Secondary | ICD-10-CM | POA: Diagnosis not present

## 2019-04-04 DIAGNOSIS — I10 Essential (primary) hypertension: Secondary | ICD-10-CM | POA: Diagnosis not present

## 2019-04-04 DIAGNOSIS — Z79899 Other long term (current) drug therapy: Secondary | ICD-10-CM

## 2019-04-04 DIAGNOSIS — Z87891 Personal history of nicotine dependence: Secondary | ICD-10-CM

## 2019-04-04 DIAGNOSIS — J9600 Acute respiratory failure, unspecified whether with hypoxia or hypercapnia: Secondary | ICD-10-CM | POA: Diagnosis present

## 2019-04-04 DIAGNOSIS — R0902 Hypoxemia: Secondary | ICD-10-CM

## 2019-04-04 DIAGNOSIS — N170 Acute kidney failure with tubular necrosis: Secondary | ICD-10-CM | POA: Diagnosis not present

## 2019-04-04 DIAGNOSIS — Z794 Long term (current) use of insulin: Secondary | ICD-10-CM

## 2019-04-04 DIAGNOSIS — Z79891 Long term (current) use of opiate analgesic: Secondary | ICD-10-CM

## 2019-04-04 DIAGNOSIS — F0390 Unspecified dementia without behavioral disturbance: Secondary | ICD-10-CM | POA: Diagnosis present

## 2019-04-04 LAB — COMPREHENSIVE METABOLIC PANEL
ALT: 19 U/L (ref 0–44)
AST: 25 U/L (ref 15–41)
Albumin: 1.5 g/dL — ABNORMAL LOW (ref 3.5–5.0)
Alkaline Phosphatase: 106 U/L (ref 38–126)
Anion gap: 12 (ref 5–15)
BUN: 50 mg/dL — ABNORMAL HIGH (ref 8–23)
CO2: 16 mmol/L — ABNORMAL LOW (ref 22–32)
Calcium: 8.4 mg/dL — ABNORMAL LOW (ref 8.9–10.3)
Chloride: 116 mmol/L — ABNORMAL HIGH (ref 98–111)
Creatinine, Ser: 2.65 mg/dL — ABNORMAL HIGH (ref 0.44–1.00)
GFR calc Af Amer: 18 mL/min — ABNORMAL LOW (ref >60)
GFR calc non Af Amer: 15 mL/min — ABNORMAL LOW (ref >60)
Glucose, Bld: 103 mg/dL — ABNORMAL HIGH (ref 70–99)
Potassium: 3.7 mmol/L (ref 3.5–5.1)
Sodium: 144 mmol/L (ref 135–145)
Total Bilirubin: 0.6 mg/dL (ref 0.3–1.2)
Total Protein: 4.4 g/dL — ABNORMAL LOW (ref 6.5–8.1)

## 2019-04-04 LAB — PROCALCITONIN: Procalcitonin: 2.84 ng/mL

## 2019-04-04 LAB — TYPE AND SCREEN
ABO/RH(D): A POS
Antibody Screen: NEGATIVE

## 2019-04-04 LAB — LACTATE DEHYDROGENASE: LDH: 273 U/L — ABNORMAL HIGH (ref 98–192)

## 2019-04-04 LAB — CBC
HCT: 37.1 % (ref 36.0–46.0)
Hemoglobin: 11.6 g/dL — ABNORMAL LOW (ref 12.0–15.0)
MCH: 29.1 pg (ref 26.0–34.0)
MCHC: 31.3 g/dL (ref 30.0–36.0)
MCV: 93 fL (ref 80.0–100.0)
Platelets: 288 10*3/uL (ref 150–400)
RBC: 3.99 MIL/uL (ref 3.87–5.11)
RDW: 16.2 % — ABNORMAL HIGH (ref 11.5–15.5)
WBC: 11.1 10*3/uL — ABNORMAL HIGH (ref 4.0–10.5)
nRBC: 0.2 % (ref 0.0–0.2)

## 2019-04-04 LAB — TROPONIN I (HIGH SENSITIVITY): Troponin I (High Sensitivity): 16 ng/L (ref <18)

## 2019-04-04 LAB — POC OCCULT BLOOD, ED: Fecal Occult Bld: POSITIVE — AB

## 2019-04-04 LAB — FERRITIN: Ferritin: 630 ng/mL — ABNORMAL HIGH (ref 11–307)

## 2019-04-04 LAB — C-REACTIVE PROTEIN: CRP: 12.5 mg/dL — ABNORMAL HIGH (ref <1.0)

## 2019-04-04 MED ORDER — FAMOTIDINE IN NACL 20-0.9 MG/50ML-% IV SOLN: 20.0000 mg | Freq: Once | INTRAVENOUS | Status: DC | PRN

## 2019-04-04 MED ORDER — SODIUM CHLORIDE 0.9 % IV SOLN: 200.0000 mg | Freq: Once | INTRAVENOUS | Status: DC

## 2019-04-04 MED ORDER — GUAIFENESIN-DM 100-10 MG/5ML PO SYRP
10.0000 mL | ORAL_SOLUTION | ORAL | Status: DC | PRN
  Administered 2019-04-06: 10 mL via ORAL
  Filled 2019-04-04: qty 10

## 2019-04-04 MED ORDER — SODIUM CHLORIDE 0.9 % IV SOLN
INTRAVENOUS | Status: DC | PRN
  Administered 2019-04-04: 250 mL via INTRAVENOUS

## 2019-04-04 MED ORDER — EPINEPHRINE 0.3 MG/0.3ML IJ SOAJ: 0.3000 mg | Freq: Once | INTRAMUSCULAR | Status: DC | PRN

## 2019-04-04 MED ORDER — ALBUMIN HUMAN 5 % IV SOLN
12.5000 g | Freq: Once | INTRAVENOUS | Status: AC
  Administered 2019-04-04: 12.5 g via INTRAVENOUS
  Filled 2019-04-04: qty 250

## 2019-04-04 MED ORDER — ALBUTEROL SULFATE HFA 108 (90 BASE) MCG/ACT IN AERS: 2.0000 | INHALATION_SPRAY | Freq: Once | RESPIRATORY_TRACT | Status: DC | PRN

## 2019-04-04 MED ORDER — DEXAMETHASONE SODIUM PHOSPHATE 10 MG/ML IJ SOLN
6.0000 mg | INTRAMUSCULAR | Status: DC
  Administered 2019-04-04 – 2019-04-08 (×4): 6 mg via INTRAVENOUS
  Filled 2019-04-04 (×5): qty 1

## 2019-04-04 MED ORDER — PANTOPRAZOLE SODIUM 40 MG IV SOLR
40.0000 mg | Freq: Every day | INTRAVENOUS | Status: DC
  Administered 2019-04-04 – 2019-04-07 (×3): 40 mg via INTRAVENOUS
  Filled 2019-04-04 (×3): qty 40

## 2019-04-04 MED ORDER — SODIUM CHLORIDE 0.9 % IV SOLN: 100.0000 mg | Freq: Once | INTRAVENOUS | Status: DC

## 2019-04-04 MED ORDER — METHYLPREDNISOLONE SODIUM SUCC 125 MG IJ SOLR: 125.0000 mg | Freq: Once | INTRAMUSCULAR | Status: DC | PRN

## 2019-04-04 MED ORDER — SODIUM CHLORIDE 0.9 % IV BOLUS
1000.0000 mL | Freq: Once | INTRAVENOUS | Status: AC
  Administered 2019-04-04: 1000 mL via INTRAVENOUS

## 2019-04-04 MED ORDER — SODIUM CHLORIDE 0.9 % IV SOLN
INTRAVENOUS | Status: AC
  Filled 2019-04-04: qty 20

## 2019-04-04 MED ORDER — DIPHENHYDRAMINE HCL 50 MG/ML IJ SOLN: 50.0000 mg | Freq: Once | INTRAMUSCULAR | Status: DC | PRN

## 2019-04-04 MED ORDER — SODIUM CHLORIDE 0.9 % IV SOLN
100.0000 mg | Freq: Every day | INTRAVENOUS | Status: AC
  Administered 2019-04-04 – 2019-04-06 (×3): 100 mg via INTRAVENOUS
  Filled 2019-04-04: qty 100
  Filled 2019-04-04 (×2): qty 20

## 2019-04-04 NOTE — ED Notes (Signed)
Pts O2 sats decreased to 82% on baseline 4L O2.  O2 increased to 6L by [place], O2 sats increased to 89%. Will continue to monitor.

## 2019-04-04 NOTE — ED Notes (Signed)
Pt O2 sats range from 83-86% on 6L O2 by nasal cannula. EDP Bero made aware.  Pt c/o SHOB, overall weakness.

## 2019-04-04 NOTE — ED Provider Notes (Signed)
  Provider Note MRN:  [id number]  Arrival date & time: 04/10/2019    ED Course and Medical Decision Making  Assumed care from default provider at shift change.  While patient was awaiting transport, she experienced a clinical worsening.  She is exhibiting increased tachypnea and accessory muscle use, her oxygen requirement has increased from 4 L to 6 L and she was still saturating 83% on 6 L.  Improved to the 90s now with increased supplemental oxygen.  Given this clinical change, she is no longer appropriate for discharge.  Admitted to hospitalist service for further care.  .Critical Care Performed by: Maudie Flakes, MD Authorized by: Maudie Flakes, MD   Critical care provider statement:    Critical care time (minutes):  32   Critical care was necessary to treat or prevent imminent or life-threatening deterioration of the following conditions:  Respiratory failure   Critical care was time spent personally by me on the following activities:  Discussions with consultants, evaluation of patient's response to treatment, examination of patient, ordering and performing treatments and interventions, ordering and review of laboratory studies, ordering and review of radiographic studies, pulse oximetry, re-evaluation of patient's condition, obtaining history from patient or surrogate and review of old charts   I assumed direction of critical care for this patient from another provider in my specialty: yes      Final Clinical Impressions(s) / ED Diagnoses     ICD-10-CM   1. Lower GI bleed  K92.2   2. Elevated serum creatinine  R79.89   3. Acute respiratory failure with hypoxia (HCC)  J96.01   4. COVID-19  U07.1     ED Discharge Orders    None        Discharge Instructions     Ms [place]'s labs are included.  Her hemoglobin is stable (11.6) from discharge yesterday (11.5).  Her blood pressure was stable in the ED and similar to her discharge blood pressure.  Based on her clinical  presentation, I did not feel she was having a new, acute, or brisk lower GI bleed requiring re-admission at this time.  She has a mild worsening of Creatinine (today was 2.5) from her discharge, and we gave her 1 liter of IV fluids.    Barth Kirks. [place] Small, [place] [email or web address]    Maudie Flakes, MD 03/27/2019 [phone]

## 2019-04-04 NOTE — ED Notes (Signed)
Patient is from [facility].

## 2019-04-04 NOTE — H&P (Addendum)
[name] ION:[id number] DOB: 12-12-1928 DOA: 04/13/2019     PCP: Babs Sciara, MD   Outpatient Specialists:     Pulmonary    Dr.McQuaid   Patient arrived to ER on 04/05/2019 at 1348  Patient coming from:  From facility    Chief Complaint:   Chief Complaint  Patient presents with  . Rectal Bleeding  . COVID positive    HPI: [name] is a 86 y.o. female with medical history significant of recent COVID PNA positive on 31 March 2018, recent GI hemorrhage anemia, history of colon cancer, history of recurrent partial small bowel obstructions, COPD, HLD, prediabetes, peripheral neuropathy, CKD, urothelial carcinoma of the ureter, history of breast cancer      Presented with retention and tachypnea came to remdesivir infusion she was given 250 mL bolus and transferred to [facility], ER Patient denies bloody stools, denies N/V/D reports short of breath, denies chest pain Initial blood pressure 100/53 after IVF fluids up to 130/62 heart rate 104 satting initially 90% on 4 L which is her baseline By time patient arrived to emergency department she was satting 83% and required up to 6 L.  Recently was admitted secondary to feeling very weak was   found to have hemoglobin down to 7.7 and SBO.  She was admitted initially by general surgery Underwent exploratory laparotomy wedge liver biopsy on 27 March 2019 that showed metastatic adenocarcinoma with colon likely being primary. patient did have a GI evaluation for suspected lower GI bleed during her most recen hospitalization and was found of no evidence of significant bleed.  She tested negative on admission January 08/2019 she was ready to be discharged to SNF was retested for Covid again at that time on the 17th she did test positive and was transferred from general surgical service to hospitalist.   Chest x-ray did show COVID-19 PNA  Patient received steroids and remdesivir Patient was able to be discharged  yesterday on outpatient remdesivir infusions At the time of discharge hemoglobin was 11.5 Discharged to SNF for rehab last dose of IV remdesivir was planned to be on 22 January Patient was discharges DO NOT RESUSCITATE if palliative care consult for end-of-life discussion and stated she may consider hospice in the future    Infectious risk factors:  Reports  shortness of breath   KNOWN COVID POSITIVE     Lab Results  Component Value Date   SARSCOV2NAA POSITIVE (A) 04/02/2019   SARSCOV2NAA POSITIVE (A) 04/01/2019   SARSCOV2NAA NEGATIVE 03/21/2019   SARSCOV2NAA NEGATIVE 03/15/2019    Regarding pertinent Chronic problems:    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 Critical Care  . Comprehensive metabolic panel  . CBC  . Diet NPO time specified  . Orthostatic Vital signs  .  Initiate Carrier Fluid Protocol  . Consult to hospitalist  ALL PATIENTS BEING ADMITTED/HAVING PROCEDURES NEED COVID-19 SCREENING  . Airborne Isolation  . Pulse oximetry, continuous  . POC occult blood, ED  . Type and screen [facility]     Following Medications were ordered in ER: Medications  sodium chloride 0.9 % bolus 1,000 mL (1,000 mLs Intravenous New Bag/Given Apr 08, 2019 1642)        Consult Orders  (From admission, onward)         Start     Ordered   2019-04-08 1818   Consult to hospitalist  ALL PATIENTS BEING ADMITTED/HAVING PROCEDURES NEED COVID-19 SCREENING  Once    Comments: ALL PATIENTS BEING ADMITTED/HAVING PROCEDURES NEED COVID-19 SCREENING  Provider:  (Not yet assigned)  Question Answer Comment  Place call to: [facility]   Reason for Consult Admit      04/08/2019 1817           Significant initial  Findings: Abnormal Labs Reviewed  COMPREHENSIVE METABOLIC PANEL - Abnormal; Notable for the following components:      Result Value   Chloride 116 (*)    CO2 16 (*)    Glucose, Bld 103 (*)    BUN 50 (*)    Creatinine, Ser 2.65 (*)    Calcium 8.4 (*)    Total Protein 4.4 (*)    Albumin 1.5 (*)    GFR calc non Af Amer 15 (*)    GFR calc Af Amer 18 (*)    All other components within normal limits  CBC - Abnormal; Notable for the following components:   WBC 11.1 (*)    Hemoglobin 11.6 (*)    RDW 16.2 (*)    All other components within normal limits  POC OCCULT BLOOD, ED - Abnormal; Notable for the following components:   Fecal Occult Bld POSITIVE (*)    All other components within normal limits   Otherwise labs showing:    Recent Labs  Lab 03/29/19 0646 03/29/19 0646 03/30/19 0007 03/30/19 0007 03/31/19 0753 04/01/19 0623 04/02/19 1215 04/03/19 0524 04/08/2019 1420  NA 138   < > 136   < > 142 142 142 142 144  K 3.9   < > 3.4*   < > 3.6 3.5 3.1* 3.1* 3.7  CO2 18*   < > 16*   < > 17* 16* 17* 17* 16*  GLUCOSE 112*   < > 85   < > 79 86 104* 136* 103*  BUN 40*   < > 40*   < > 38* 39* 37* 39* 50*  CREATININE 2.38*   < > 2.28*   < > 2.22* 2.02* 2.03* 2.12* 2.65*  CALCIUM 6.7*   < > 7.1*   < > 7.5* 7.7* 7.7* 7.8* 8.4*  MG 1.7  --  1.7  --   --   --  1.5* 2.0  --   PHOS 3.3  --  3.1  --   --   --  4.1 5.0*  --    < > = values in this interval not displayed.    Cr   Up from baseline see below Lab Results  Component Value Date   CREATININE 2.65 (H) 04-08-19   CREATININE 2.12 (H) 04/03/2019   CREATININE 2.03 (H) 04/02/2019     Recent Labs  Lab 04/02/19 1215 04/03/19 0524 2019-04-08 1420  AST 20 19 25   ALT 18 17 19   ALKPHOS 80 74 106  BILITOT  0.6 0.4 0.6  PROT 4.3* 4.2* 4.4*  ALBUMIN 1.4* 1.3* 1.5*   Lab Results  Component Value Date   CALCIUM 8.4 (L) 03/20/2019   PHOS 5.0 (H) 04/03/2019     WBC      Component Value Date/Time   WBC 11.1 (H) 12-Apr-2019 1420   ANC    Component Value Date/Time   NEUTROABS 8.0 (H) 04/03/2019 0524   NEUTROABS 7.2 (H) 10/09/2018 0918   ALC No components found for: LYMPHAB    Plt: Lab Results  Component Value Date   PLT 288 12-Apr-2019     Lactic Acid, Venous    Component Value Date/Time   LATICACIDVEN 0.5 03/21/2019 1050    Procalcitonin   Ordered   COVID-19 Labs  Recent Labs    04/02/19 1215 04/03/19 0524  DDIMER 9.73* 9.54*  FERRITIN 918* 798*  CRP 20.5* 19.0*    Lab Results  Component Value Date   SARSCOV2NAA POSITIVE (A) 04/02/2019   SARSCOV2NAA POSITIVE (A) 04/01/2019   SARSCOV2NAA NEGATIVE 03/21/2019   SARSCOV2NAA NEGATIVE 03/15/2019    HG/HCT  stable,       Component Value Date/Time   HGB 11.6 (L) 12-Apr-2019 1420   HGB 8.3 (L) 10/09/2018 0918   HCT 37.1 03/16/2019 1420   HCT 26.1 (L) 10/09/2018 0918     Troponin  ordered     ECG: Ordered   DM  labs:  HbA1C: Recent Labs    05/08/18 1141 04/02/19 1632  HGBA1C 5.2 5.7*       CBG (last 3)  Recent Labs    04/03/19 0748 04/03/19 1124 04/03/19 1652  GLUCAP 139* 121* 80       UA not ordered       Ordered    CXR -bilateral interstitial opacities unchanged    ED Triage Vitals  Enc Vitals Group     BP 04/07/2019 1358 112/67     Pulse Rate Apr 12, 2019 1435 (!) 103     Resp 12-Apr-2019 1358 (!) 26     Temp 04/07/2019 1358 (!) 96.7 F (35.9 C)     Temp Source 04/03/2019 1358 Rectal     SpO2 03/27/2019 1426 91 %     Weight --      Height --      Head Circumference --      Peak Flow --      Pain Score 04/05/2019 1435 0     Pain Loc --      Pain Edu? --       Excl. in GC? --   TMAX(24)@       Latest  Blood pressure 136/78, pulse (!) 101, temperature (!) 96.7 F (35.9 C), temperature source Rectal, resp. rate (!) 28, SpO2 92 %.    Hospitalist was called for admission for COVID acute on chronic respiratory failure with hypoxia   Review of Systems:    Pertinent positives include: fatigue  Constitutional:  No weight loss, night sweats, Fevers, chills, , weight loss  HEENT:  No headaches, Difficulty swallowing,Tooth/dental problems,Sore throat,  No sneezing, itching, ear ache, nasal congestion, post nasal drip,  Cardio-vascular:  No chest pain, Orthopnea, PND, anasarca, dizziness, palpitations.no Bilateral lower extremity swelling  GI:  No heartburn, indigestion, abdominal pain, nausea, vomiting, diarrhea, change in bowel habits, loss of appetite, melena, blood in stool, hematemesis Resp:  no shortness of breath at rest. No dyspnea on exertion, No excess mucus, no productive cough, No non-productive cough, No coughing up of blood.No change in color  of mucus.No wheezing. Skin:  no rash or lesions. No jaundice GU:  no dysuria, change in color of urine, no urgency or frequency. No straining to urinate.  No flank pain.  Musculoskeletal:  No joint pain or no joint swelling. No decreased range of motion. No back pain.  Psych:  No change in mood or affect. No depression or anxiety. No memory loss.  Neuro: no localizing neurological complaints, no tingling, no weakness, no double vision, no gait abnormality, no slurred speech, no confusion  All systems reviewed and apart from HOPI all are negative  Past Medical History:   Past Medical History:  Diagnosis Date  . Breast cancer (HCC)   . Breast cancer, right breast (HCC) 05/01/2015  . Cancer (HCC)    BREAST CANCER   . Colon polyps   . COPD (chronic obstructive pulmonary disease) (HCC)    DR. Kendrick Fries  . Diabetes mellitus without complication (HCC)    PREDIABETIC  CONTROL W/ DIET  .  Hypercholesteremia   . Hypertension   . Lung nodules   . Osteoarthritis   . Osteopenia 09/18/2013  . Osteoporosis   . Personal history of radiation therapy   . PONV (postoperative nausea and vomiting)    ' i get so sick with anesthesia"   . Shortness of breath dyspnea    W/ EXERTION  . Tick bite    3 years ago      Past Surgical History:  Procedure Laterality Date  . ABDOMINAL HYSTERECTOMY    . APPENDECTOMY    . BARTHOLIN GLAND CYST EXCISION    . BIOPSY  12/06/2018   Procedure: BIOPSY;  Surgeon: Malissa Hippo, MD;  [facility];  Service: Endoscopy;;  cecal mass biopsies   . BOWEL RESECTION N/A 03/27/2019   Procedure: PARTIAL SMALL BOWEL RESECTION;  Surgeon: Franky Macho, MD;  Location: [facility];  Service: General;  Laterality: N/A;  . BREAST EXCISIONAL BIOPSY Left    benign  . BREAST LUMPECTOMY WITH RADIOACTIVE SEED LOCALIZATION Right 05/23/2015   Procedure: RIGHT BREAST LUMPECTOMY WITH RADIOACTIVE SEED LOCALIZATION;  Surgeon: Claud Kelp, MD;  Location: [facility];  Service: General;  Laterality: Right;  . BREAST SURGERY     2000  LEFT BREAST  LUMP REMOVED  . cataract surgeries  Bilateral   . COLON RESECTION N/A 12/08/2018   Procedure: OPEN SEGMENTAL COLON RESECTION;  Surgeon: Claud Kelp, MD;  Location: [facility];  Service: General;  Laterality: N/A;  . colonoscopy    . COLONOSCOPY  03/25/2011   Procedure: COLONOSCOPY;  Surgeon: Malissa Hippo, MD;  Location: [facility];  Service: Endoscopy;  Laterality: N/A;  9:30 am  . COLONOSCOPY N/A 12/06/2018   Procedure: COLONOSCOPY;  Surgeon: Malissa Hippo, MD;  Location: [facility];  Service: Endoscopy;  Laterality: N/A;  . CYSTOSCOPY W/ URETERAL STENT PLACEMENT Left 02/27/2018   Procedure: CYSTOSCOPY WITH LEFT RETROGRADE PYELOGRAM AND lEFT URETEROSCOPY WITH BIOPSY AND LEFT URETERAL STENT PLACEMENT;  Surgeon: Malen Gauze, MD;  Location: [facility];  Service: Urology;  Laterality: Left;  . CYSTOSCOPY W/ URETERAL  STENT PLACEMENT Right 12/08/2018   Procedure: CYSTOSCOPY WITH RETROGRADE PYELOGRAM/URETERAL STENT PLACEMENT;  Surgeon: Heloise Purpura, MD;  Location: [facility];  Service: Urology;  Laterality: Right;  . left inguinal hernia repair    . LIVER BIOPSY N/A 03/27/2019   Procedure: LIVER BIOPSY;  Surgeon: Franky Macho, MD;  Location: [facility];  Service: General;  Laterality: N/A;  . multiple toe surgeries    .  ROBOT ASSITED LAPAROSCOPIC NEPHROURETERECTOMY Left 05/11/2018   Procedure: XI ROBOT ASSITED LAPAROSCOPIC LEFT DISTAL URETERECTOMY, URETERAL  REIMPLANTATION, WITH PSOAS HITCH AND BOARI FLAP. PELVIC LYMPH NODE DISSCETION AND LEFT URETERAL STENT PLACEMENT;  Surgeon: Malen Gauze, MD;  Location: [facility];  Service: Urology;  Laterality: Left;  . TONSILLECTOMY      Social History:  Ambulatory  bed bound     reports that she quit smoking about 31 years ago. Her smoking use included cigarettes. She has a 100.00 pack-year smoking history. She has never used smokeless tobacco. She reports current alcohol use. She reports that she does not use drugs.     Family History:   Family History  Problem Relation Age of Onset  . Colon cancer Father     Allergies: Allergies  Allergen Reactions  . Augmentin [Amoxicillin-Pot Clavulanate] Nausea And Vomiting    Has patient had a PCN reaction causing immediate rash, facial/tongue/throat swelling, SOB or lightheadedness with hypotension: No Has patient had a PCN reaction causing severe rash involving mucus membranes or skin necrosis: No Has patient had a PCN reaction that required hospitalization: No Has patient had a PCN reaction occurring within the last 10 years: Unknown If all of the above answers are "NO", then may proceed with Cephalosporin use.    Marland Kitchen Amoxicillin Diarrhea    Has patient had a PCN reaction causing immediate rash, facial/tongue/throat swelling, SOB or lightheadedness with hypotension: No Has patient had a PCN reaction causing severe  rash involving mucus membranes or skin necrosis: No Has patient had a PCN reaction that required hospitalization: No Has patient had a PCN reaction occurring within the last 10 years: Unknown If all of the above answers are "NO", then may proceed with Cephalosporin use.   . Codeine Nausea And Vomiting  . Morphine Nausea And Vomiting  . Neomycin-Bacitracin Zn-Polymyx Itching and Rash    Unable to use Neosporin     Prior to Admission medications   Medication Sig Start Date End Date Taking? Authorizing Provider  amLODipine (NORVASC) 5 MG tablet TAKE ONE TABLET BY MOUTH ONCE DAILY. Patient taking differently: Take 5 mg by mouth daily.  11/14/18  Yes Babs Sciara, MD  anastrozole (ARIMIDEX) 1 MG tablet Take 1 tablet (1 mg total) by mouth daily. Patient taking differently: Take 1 mg by mouth every evening.  11/04/17  Yes Doreatha Massed, MD  fluticasone [facility]) 50 MCG/ACT nasal spray Place 2 sprays into both nostrils daily as needed for allergies.    Yes [provider]  hydrocortisone (ANUSOL-HC) 2.5 % rectal cream Place 1 application rectally 2 (two) times daily as needed for hemorrhoids or anal itching.  12/27/18  Yes [provider]  levocetirizine (XYZAL) 5 MG tablet Take 5 mg by mouth daily as needed for allergies.  10/27/18  Yes [provider]  Multiple Vitamins-Minerals (MULTIVITAMIN WITH MINERALS) tablet Take 1 tablet by mouth daily. 12/15/18 12/15/19 Yes Almon Hercules, MD  nortriptyline (PAMELOR) 10 MG capsule Take 1-2 capsules (10-20 mg total) by mouth at bedtime. 03/23/19  Yes Johnson, Clanford L, MD  ondansetron (ZOFRAN) 4 MG tablet Take 1 tablet (4 mg total) by mouth every 6 (six) hours as needed for nausea. 03/19/19  Yes Shah, Pratik D, DO  pantoprazole (PROTONIX) 40 MG tablet Take 1 tablet (40 mg total) by mouth daily. 03/23/19 03/22/20 Yes Johnson, Clanford L, MD  PROAIR RESPICLICK 108 (90 Base) MCG/ACT AEPB INHALE 1 PUFF INTO THE LUNGS EVERY 4 HOURS AS  NEEDED. Patient  taking differently: Inhale 1 puff into the lungs every 4 (four) hours as needed (for shortness of breath/wheezing).  03/24/18  Yes Lupita Leash, MD  acetaminophen (TYLENOL) 325 MG tablet Take 2 tablets (650 mg total) by mouth every 6 (six) hours as needed for mild pain (or Fever >/= 101). 04/03/19   Shon Hale, MD  ascorbic acid (VITAMIN C) 500 MG tablet Take 1 tablet (500 mg total) by mouth daily. 04/15/2019   Shon Hale, MD  dexamethasone (DECADRON) 6 MG tablet Take 1 tablet (6 mg total) by mouth daily with breakfast for 5 days. 04/03/19 04/08/19  Shon Hale, MD  guaiFENesin (MUCINEX) 600 MG 12 hr tablet Take 1 tablet (600 mg total) by mouth 2 (two) times daily for 10 days. 04/03/19 04/13/19  Shon Hale, MD  guaiFENesin-dextromethorphan (ROBITUSSIN DM) 100-10 MG/5ML syrup Take 10 mLs by mouth every 4 (four) hours as needed for cough. 04/03/19   Emokpae, Courage, MD  insulin aspart (NOVOLOG) 100 UNIT/ML FlexPen Inject 0-10 Units into the skin 3 (three) times daily with meals. insulin aspart (novoLOG) injection 0-10 Units 0-10 Units Subcutaneous, 3 times daily with meals CBG < 70: Implement Hypoglycemia Standing Orders and refer to Hypoglycemia Standing Orders sidebar report  CBG 70 - 120: 0 unit CBG 121 - 150: 0 unit  CBG 151 - 200: 1 unit CBG 201 - 250: 2 units CBG 251 - 300: 4 units CBG 301 - 350: 6 units  CBG 351 - 400: 8 units  CBG > 400: 10 units 04/03/19   Emokpae, Courage, MD  OXYGEN Inhale 2 L into the lungs. As needed for activities requiring exertion, per patient uses very sparingly    [provider]  senna-docusate (SENOKOT-S) 8.6-50 MG tablet Take 2 tablets by mouth at bedtime. 04/03/19 04/02/20  Shon Hale, MD  traMADol (ULTRAM) 50 MG tablet Take 1 tablet (50 mg total) by mouth every 6 (six) hours as needed for up to 5 days (mild pain). 04/03/19 04/08/19  Shon Hale, MD  zinc sulfate 220 (50 Zn) MG capsule Take 1 capsule (220 mg total) by  mouth daily. 04/06/2019   Shon Hale, MD   Physical Exam: Blood pressure 136/78, pulse (!) 101, temperature (!) 96.7 F (35.9 C), temperature source Rectal, resp. rate (!) 28, SpO2 92 %. 1. General:  in  Acute distress increased work of breathing   Chronically ill  -appearing 2. Psychological: Alert and  Oriented 3. Head/ENT:    Dry Mucous Membranes                          Head Non traumatic, neck supple                          Poor Dentition 4. SKIN:   decreased Skin turgor,  Skin clean Dry and intact no rash 5. Heart: Regular rate and rhythm no  Murmur, no Rub or gallop 6. Lungs: no wheezes or crackles   7. Abdomen: Soft, appropriately tender postsurgical dressing in place, Non distended  t 8. Lower extremities: no clubbing, cyanosis,1+edema 9. Neurologically Grossly intact, moving all 4 extremities equally  10. MSK: Normal range of motion   All other LABS:     Recent Labs  Lab 03/31/19 0753 04/01/19 0623 04/02/19 1215 04/03/19 0524 03/18/2019 1420  WBC 9.3 9.3 8.6 8.7 11.1*  NEUTROABS  --   --  7.8* 8.0*  --   HGB 11.2*  11.3* 12.1 11.5* 11.6*  HCT 35.8* 36.3 38.0 36.6 37.1  MCV 94.7 95.8 94.1 93.8 93.0  PLT 148* 150 178 188 288     Recent Labs  Lab 03/29/19 0646 03/29/19 0646 03/30/19 0007 03/30/19 0007 03/31/19 0753 04/01/19 0623 04/02/19 1215 04/03/19 0524 03/19/2019 1420  NA 138   < > 136   < > 142 142 142 142 144  K 3.9   < > 3.4*   < > 3.6 3.5 3.1* 3.1* 3.7  CL 113*   < > 112*   < > 116* 118* 119* 116* 116*  CO2 18*   < > 16*   < > 17* 16* 17* 17* 16*  GLUCOSE 112*   < > 85   < > 79 86 104* 136* 103*  BUN 40*   < > 40*   < > 38* 39* 37* 39* 50*  CREATININE 2.38*   < > 2.28*   < > 2.22* 2.02* 2.03* 2.12* 2.65*  CALCIUM 6.7*   < > 7.1*   < > 7.5* 7.7* 7.7* 7.8* 8.4*  MG 1.7  --  1.7  --   --   --  1.5* 2.0  --   PHOS 3.3  --  3.1  --   --   --  4.1 5.0*  --    < > = values in this interval not displayed.     Recent Labs  Lab 04/02/19 1215  04/03/19 0524 03/20/2019 1420  AST 20 19 25   ALT 18 17 19   ALKPHOS 80 74 106  BILITOT 0.6 0.4 0.6  PROT 4.3* 4.2* 4.4*  ALBUMIN 1.4* 1.3* 1.5*       Cultures:    Component Value Date/Time   SDES  12/08/2018 1407    URINE, RANDOM Performed at [facility], [address] [place], [place] [address]    SPECREQUEST  12/08/2018 1407    NONE Performed at [facility], [address], [place], [place] [address]    CULT >=100,000 COLONIES/mL KLEBSIELLA OXYTOCA (A) 12/08/2018 1407   REPTSTATUS 12/10/2018 FINAL 12/08/2018 1407     Radiological Exams on Admission: No results found.  Chart has been reviewed   Assessment/Plan  86 y.o. female with medical history significant of recent COVID PNA positive on 31 March 2018, recent GI hemorrhage anemia, history of colon cancer, history of recurrent partial small bowel obstructions, COPD, HLD, prediabetes, peripheral neuropathy, CKD, urothelial carcinoma of the ureter, history of breast cancer       Admitted for acute on chronic respiratory failure secondary to COVID-19  Present on Admission: . Pneumonia due to COVID-19 virus/ . Acute respiratory disease due to COVID-19 virus-  FROM  SNF WITH KNOWN HX OF COVID19   Following concerning LAB/ imaging findings:  CBC: leukopenia, lymphopenia    ANC/ALC ratio>3.5 BMP: increased BUN/Cr   LFTs: increased AST/ALT/Tbili     CXR: hazy bilateral peripheral opacities     Following complications noted:     evidence of AKI - will provide gentle rehydration   elevated LFT's likely in the setting of COVID continue to follow   acute respiratory failure with hypoxia - continue oxygen treatment  Plan of treatment:   Admit on Airborn Precautions to Current facility given recent GI bleed/surgical intervention  -given severity of illness initiate steroids Decadron 6mg  q 24 hours And pharmacy consult for remdesivir pt will need to continue previously started course  -    contraindications  To  Actemra  Active cancer - Will follow daily d.dimer - Assess for ability to prone  - Supportive management -Fluid sparing resuscitation  -Provide oxygen as needed currently on   SpO2: 90 % O2 Flow Rate (L/min): 6 L/min     Poor Prognostic factors  86 y.o.  Personal hx of  COPD, HTN,  Evidence of  organ damage  Present AKI, respiratory failure requiring >4L [place]  Tachypnea      Will order Airborne and Contact precautions  Family/ patient prognosis discussion: I have discussed case with the family/ patient  who are aware of their prognosis At this point   (patient)  would like  to be DNR/DNI        . Carotid artery disease (HCC) - chronic stable  . Hyperlipidemia  - chronic stable    . Essential hypertension, hold home medications for tonight given hypotension  . Prediabetes -order sliding scale   . Breast cancer, right breast (HCC) -continue home medication   . COPD (chronic obstructive pulmonary disease) (HCC) -chronic at baseline on 4 L but currently worsening secondary to COVID continue inhalers  . 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 -patient was planning to see oncology as an outpatient overall fairly poor prognosis given ongoing Covid infection with evidence of severe respiratory failure continue to observe further work-up and treatment pending however patient does with Covid   . CKD (chronic kidney disease), stage IV (HCC) -chronic worsening AKI in the setting of Covid we will gently rehydrate   . Iron deficiency anemia due to chronic blood loss chronic currently stable patient denies any blood in stool   . 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 recent Lower GI bleed from cecal cancer -patient may have some persistent Hemoccult positive stools at this point hemoglobin is stable  pt states she have not had any recent blood in stool   hypoalbuminemia - may be 3rd spacing administer  albumin  Other plan as per orders.  DVT prophylaxis:  SCD     Code Status:   DNR/DNI  as per patient   I had personally discussed CODE STATUS with patient   Family Communication:   Family not at  Bedside     Disposition Plan:                             Back to current facility when stable                                        Consults called: none  Admission status:  ED Disposition    ED Disposition Condition Comment   Admit  The patient appears reasonably stabilized for admission considering the current resources, flow, and capabilities available in the ED at this time, and I doubt any other [facility] requiring further screening and/or treatment in the ED prior to admission is  present.         inpatient     Expect 2 midnight stay secondary to severity of patient's current illness including   hemodynamic instability despite optimal treatment (tachycardia hypotension   tachypnea  Hypoxia, )   Severe lab/radiological/exam abnormalities including:    COVID positive  and extensive comorbidities including:   COPD/asthma   CKD  liver disease  malignancy, .    That are currently  affecting medical management.   I expect  patient to be hospitalized for 2 midnights requiring inpatient medical care.  Patient is at high risk for adverse outcome (such as loss of life or disability) if not treated.  Indication for inpatient stay as follows:    Hemodynamic instability despite maximal medical therapy,    New or worsening hypoxia  Need for IV antibiotics, IV fluids, IV rate controling medications, IV antivirals IV pain medications,      Level of care     SDU tele indefinitely please discontinue once patient no longer qualifies   Precautions: admitted as  covid positive Airborne and Contact precautions   PPE: Used by the provider:   P100  eye Goggles,  Gloves  gown   [name] 04/05/2019, 12:06 AM   [facility]     after 2 AM please page floor coverage  PA If 7AM-7PM, please contact the day team taking care of the patient using [email or web address]   Patient was evaluated in the context of the global COVID-19 pandemic, which necessitated consideration that the patient might be at risk for infection with the SARS-CoV-2 virus that causes COVID-19. Institutional protocols and algorithms that pertain to the evaluation of patients at risk for COVID-19 are in a state of rapid change based on information released by regulatory bodies including the CDC and federal and state organizations. These policies and algorithms were followed during the patient's care.

## 2019-04-04 NOTE — ED Notes (Signed)
Spoke with pts cousin, Manuela Schwartz, regarding pts discharge status.  Per Manuela Schwartz, she would feel more comfortable if pt were to be transported via EMS back to rehab facility.

## 2019-04-04 NOTE — ED Triage Notes (Signed)
Pt BIBA from [facility] infusion center.   Per EMS- MD at facility called d/t hypotension, possible GI bleed. Pt reports bloody stools x2 days. Pt with hx of gi bleed. BP 100/53 at infusion center.  EMS reports BP 130/62; HR 104; 90 % 4L (baseline O2);  Pt arrives with PICC line in place.

## 2019-04-04 NOTE — ED Notes (Signed)
ED Provider at bedside. 

## 2019-04-04 NOTE — Discharge Instructions (Addendum)
Ms Jost's labs are included.  Her hemoglobin is stable (11.6) from discharge yesterday (11.5).  Her blood pressure was stable in the ED and similar to her discharge blood pressure.  Based on her clinical presentation, I did not feel she was having a new, acute, or brisk lower GI bleed requiring re-admission at this time.  She has a mild worsening of Creatinine (today was 2.5) from her discharge, and we gave her 1 liter of IV fluids.

## 2019-04-04 NOTE — Progress Notes (Signed)
  Patient presents to infusion center hypotensive and tachypneic -Verbal order to RN   Georgina Quint to give normal saline IV 250 mL bolus x1 and transport patient by ambulance to [facility] long ED for further evaluation  -Patient with COVID-19 pneumonia and hypoxia but also has history of colorectal carcinoma with intermittent GI bleeding  -Patient will need a stat CBC, may need transfusion if anemia is contributing to her hypotension -RN will give IV fluid boluses as ordered above, call 911 and transport patient by EMS to [facility] long ED for further evaluation  -Discussed with hospitalist team at [facility], patient too unstable and at risk for decompensation to allow for possible transfer from the outpatient infusion center to [facility] inpatient service -Patient  May also need GI and surgical input  Roxan Hockey, MD

## 2019-04-04 NOTE — Progress Notes (Signed)
Patient showed up to the [facility] for Remdesivir infusion.  When she arrived she was hypotensive, tachypnei, and had an episode of incontinence. Dr. Denton Brick notified of patient's condition and order given to hold Remdesivir dose.  Dr. Denton Brick will follow with Dr. Bonner Puna.   Donne Anon, RN

## 2019-04-04 NOTE — ED Provider Notes (Signed)
[facility] DEPT Provider Note   CSN: [id number] Arrival date & time: 03/30/2019  1348     History Chief Complaint  Patient presents with  . Rectal Bleeding  . COVID positive    [name] is a 86 y.o. female with a history of mild dementia, cecal adenocarcinoma status post hemicolectomy 2020, stage III kidney disease, hypertension, COPD, recent hospitalization for hypoxic respiratory failure secondary to Covid, presenting to the emergency department with blood in stool.  The patient had just been discharged to an SNF facility yesterday after hospitalization for her Covid illness.  She was receiving her scheduled remdesivir infusion today.  However at that time there was some concern that she may have had a low blood pressure (100/60) and that she had been reporting blood in her stool for a few days.  She was sent to the emergency department out of concern for possible GI bleed.  Of note, the patient did have a GI evaluation for suspected lower GI bleed during her most recen hospitalization.  He was found of no evidence of significant bleed at that time.  Her hemoglobin at time of discharge 11.5.  She was suspected of chronic iron deficiency anemia in the setting of colonic malignancy.  She was noted that time to have heme positive stools.  Since discharge she has been on 4L [place].   HPI     Past Medical History:  Diagnosis Date  . Breast cancer ([place])   . Breast cancer, right breast ([place]) 05/01/2015  . Cancer (HCC)    BREAST CANCER   . Colon polyps   . COPD (chronic obstructive pulmonary disease) ([place])    DR. [place]  . Diabetes mellitus without complication ([place])    PREDIABETIC  CONTROL W/ DIET  . Hypercholesteremia   . Hypertension   . Lung nodules   . Osteoarthritis   . Osteopenia 09/18/2013  . Osteoporosis   . Personal history of radiation therapy   . PONV (postoperative nausea and vomiting)    ' i get so sick with anesthesia"   .  Shortness of breath dyspnea    W/ EXERTION  . Tick bite    3 years ago     Patient Active Problem List   Diagnosis Date Noted  . Pneumonia due to COVID-19 virus 04/02/2019  . Acute respiratory disease due to COVID-19 virus 04/02/2019  . Recurrent partial small bowel obstruction 03/25/2019  . Small bowel obstruction ([place]) 03/24/2019  . Goals of care, counseling/discussion   . Palliative care by specialist   . Encounter for hospice care discussion   . Anemia   . Gastrointestinal hemorrhage   . Nausea vomiting and diarrhea   . Guaiac positive stools 03/21/2019  . DNR (do not resuscitate) 03/21/2019  . SBO (small bowel obstruction) ([place]) 03/08/2019  . Cecal cancer ([place]) 12/26/2018  . Lower GI bleed from cecal cancer 12/10/2018  . S/P colon resection 12/08/2018  . Iron deficiency anemia due to chronic blood loss   . 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 CKD (chronic kidney disease), stage IV ([place]) 12/05/2018  . Acute anemia 12/04/2018  . Ureteral mass 05/11/2018  . Bladder cancer ([place]) 03/20/2018  . 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 Chronic respiratory failure with hypoxia ([place]) 08/13/2016  . Pulmonary nodule 07/19/2016  . Renal bruit 06/29/2016  . Dyspnea  05/04/2016  . COPD (chronic obstructive pulmonary disease) ([place]) 04/26/2016  . Breast cancer, right breast ([place]) 05/01/2015  . Osteoarthritis of both knees 04/21/2015  . Acute bronchitis with COPD ([place]) 03/21/2015  . Abnormal CXR 01/23/2015  . Prediabetes 10/15/2014  . Osteopenia 09/18/2013  . Peripheral neuropathy 08/13/2013  . Hyperglycemia 08/13/2013  . Essential hypertension, benign 09/20/2012  . Carotid artery disease ([place]) 09/20/2012  . Hyperlipidemia 09/20/2012  . Arthritis of right knee 06/08/2007  . KNEE PAIN 06/08/2007    Past Surgical History:    Procedure Laterality Date  . ABDOMINAL HYSTERECTOMY    . APPENDECTOMY    . BARTHOLIN GLAND CYST EXCISION    . BIOPSY  12/06/2018   Procedure: BIOPSY;  Surgeon: Rogene Houston, MD;  [facility];  Service: Endoscopy;;  cecal mass biopsies   . BOWEL RESECTION N/A 03/27/2019   Procedure: PARTIAL SMALL BOWEL RESECTION;  Surgeon: Aviva Signs, MD;  Location: [facility] ORS;  Service: General;  Laterality: N/A;  . BREAST EXCISIONAL BIOPSY Left    benign  . BREAST LUMPECTOMY WITH RADIOACTIVE SEED LOCALIZATION Right 05/23/2015   Procedure: RIGHT BREAST LUMPECTOMY WITH RADIOACTIVE SEED LOCALIZATION;  Surgeon: Fanny Skates, MD;  Location: [facility];  Service: General;  Laterality: Right;  . BREAST SURGERY     2000  LEFT BREAST  LUMP REMOVED  . cataract surgeries  Bilateral   . COLON RESECTION N/A 12/08/2018   Procedure: OPEN SEGMENTAL COLON RESECTION;  Surgeon: Fanny Skates, MD;  Location: [facility];  Service: General;  Laterality: N/A;  . colonoscopy    . COLONOSCOPY  03/25/2011   Procedure: COLONOSCOPY;  Surgeon: Rogene Houston, MD;  [facility];  Service: Endoscopy;  Laterality: N/A;  9:30 am  . COLONOSCOPY N/A 12/06/2018   Procedure: COLONOSCOPY;  Surgeon: Rogene Houston, MD;  [facility];  Service: Endoscopy;  Laterality: N/A;  . CYSTOSCOPY W/ URETERAL STENT PLACEMENT Left 02/27/2018   Procedure: CYSTOSCOPY WITH LEFT RETROGRADE PYELOGRAM AND lEFT URETEROSCOPY WITH BIOPSY AND LEFT URETERAL STENT PLACEMENT;  Surgeon: Cleon Gustin, MD;  Location: [facility];  Service: Urology;  Laterality: Left;  . CYSTOSCOPY W/ URETERAL STENT PLACEMENT Right 12/08/2018   Procedure: CYSTOSCOPY WITH RETROGRADE PYELOGRAM/URETERAL STENT PLACEMENT;  Surgeon: Raynelle Bring, MD;  [facility];  Service: Urology;  Laterality: Right;  . left inguinal hernia repair    . LIVER BIOPSY N/A 03/27/2019   Procedure: LIVER BIOPSY;  Surgeon: Aviva Signs, MD;  Location: [facility];  Service: General;   Laterality: N/A;  . multiple toe surgeries    . ROBOT ASSITED LAPAROSCOPIC NEPHROURETERECTOMY Left 05/11/2018   Procedure: XI ROBOT ASSITED LAPAROSCOPIC LEFT DISTAL URETERECTOMY, URETERAL  REIMPLANTATION, WITH PSOAS HITCH AND BOARI FLAP. PELVIC LYMPH NODE DISSCETION AND LEFT URETERAL STENT PLACEMENT;  Surgeon: Cleon Gustin, MD;  Location: [facility];  Service: Urology;  Laterality: Left;  . TONSILLECTOMY       OB History   No obstetric history on file.     Family History  Problem Relation Age of Onset  . Colon cancer Father     Social History   Tobacco Use  . Smoking status: Former Smoker    Packs/day: 2.00    Years: 50.00    Pack years: 100.00    Types: Cigarettes    Quit date: 03/15/1988    Years since quitting: 31.0  . Smokeless tobacco: Never Used  Substance Use Topics  . Alcohol use: Yes    Alcohol/week:  0.0 standard drinks    Comment: seldom   . Drug use: No    Home Medications Prior to Admission medications   Medication Sig Start Date End Date Taking? Authorizing Provider  amLODipine (NORVASC) 5 MG tablet TAKE ONE TABLET BY MOUTH ONCE DAILY. Patient taking differently: Take 5 mg by mouth daily.  11/14/18  Yes Kathyrn Drown, MD  anastrozole (ARIMIDEX) 1 MG tablet Take 1 tablet (1 mg total) by mouth daily. Patient taking differently: Take 1 mg by mouth every evening.  11/04/17  Yes Derek Jack, MD  fluticasone [facility]) 50 MCG/ACT nasal spray Place 2 sprays into both nostrils daily as needed for allergies.    Yes [provider]  hydrocortisone (ANUSOL-HC) 2.5 % rectal cream Place 1 application rectally 2 (two) times daily as needed for hemorrhoids or anal itching.  12/27/18  Yes [provider]  levocetirizine (XYZAL) 5 MG tablet Take 5 mg by mouth daily as needed for allergies.  10/27/18  Yes [provider]  Multiple Vitamins-Minerals (MULTIVITAMIN WITH MINERALS) tablet Take 1 tablet by mouth daily. 12/15/18 12/15/19 Yes Mercy Riding, MD  nortriptyline (PAMELOR) 10 MG capsule Take 1-2 capsules (10-20 mg total) by mouth at bedtime. 03/23/19  Yes Johnson, Clanford L, MD  ondansetron (ZOFRAN) 4 MG tablet Take 1 tablet (4 mg total) by mouth every 6 (six) hours as needed for nausea. 03/19/19  Yes Shah, Pratik D, DO  pantoprazole (PROTONIX) 40 MG tablet Take 1 tablet (40 mg total) by mouth daily. 03/23/19 03/22/20 Yes Johnson, Clanford L, MD  PROAIR RESPICLICK 509 (90 Base) MCG/ACT AEPB INHALE 1 PUFF INTO THE LUNGS EVERY 4 HOURS AS NEEDED. Patient taking differently: Inhale 1 puff into the lungs every 4 (four) hours as needed (for shortness of breath/wheezing).  03/24/18  Yes Juanito Doom, MD  acetaminophen (TYLENOL) 325 MG tablet Take 2 tablets (650 mg total) by mouth every 6 (six) hours as needed for mild pain (or Fever >/= 101). 04/03/19   Roxan Hockey, MD  ascorbic acid (VITAMIN C) 500 MG tablet Take 1 tablet (500 mg total) by mouth daily. 04/03/2019   Roxan Hockey, MD  dexamethasone (DECADRON) 6 MG tablet Take 1 tablet (6 mg total) by mouth daily with breakfast for 5 days. 04/03/19 04/08/19  Roxan Hockey, MD  guaiFENesin (MUCINEX) 600 MG 12 hr tablet Take 1 tablet (600 mg total) by mouth 2 (two) times daily for 10 days. 04/03/19 04/13/19  Roxan Hockey, MD  guaiFENesin-dextromethorphan (ROBITUSSIN DM) 100-10 MG/5ML syrup Take 10 mLs by mouth every 4 (four) hours as needed for cough. 04/03/19   Emokpae, Courage, MD  insulin aspart (NOVOLOG) 100 UNIT/ML FlexPen Inject 0-10 Units into the skin 3 (three) times daily with meals. insulin aspart (novoLOG) injection 0-10 Units 0-10 Units Subcutaneous, 3 times daily with meals CBG < 70: Implement Hypoglycemia Standing Orders and refer to Hypoglycemia Standing Orders sidebar report  CBG 70 - 120: 0 unit CBG 121 - 150: 0 unit  CBG 151 - 200: 1 unit CBG 201 - 250: 2 units CBG 251 - 300: 4 units CBG 301 - 350: 6 units  CBG 351 - 400: 8 units  CBG > 400: 10 units 04/03/19   Emokpae,  Courage, MD  OXYGEN Inhale 2 L into the lungs. As needed for activities requiring exertion, per patient uses very sparingly    [provider]  senna-docusate (SENOKOT-S) 8.6-50 MG tablet Take 2 tablets by mouth at bedtime. 04/03/19 04/02/20  Emokpae, Courage,  MD  traMADol (ULTRAM) 50 MG tablet Take 1 tablet (50 mg total) by mouth every 6 (six) hours as needed for up to 5 days (mild pain). 04/03/19 04/08/19  Roxan Hockey, MD  zinc sulfate 220 (50 Zn) MG capsule Take 1 capsule (220 mg total) by mouth daily. 03/28/2019   Roxan Hockey, MD    Allergies    Augmentin [amoxicillin-pot clavulanate], Amoxicillin, Codeine, Morphine, and Neomycin-bacitracin zn-polymyx  Review of Systems   Review of Systems  Constitutional: Positive for fatigue. Negative for chills and fever.  Respiratory: Negative for cough and shortness of breath.   Cardiovascular: Negative for chest pain and palpitations.  Gastrointestinal: Positive for blood in stool. Negative for vomiting.  All other systems reviewed and are negative.   Physical Exam Updated Vital Signs BP 138/87 (BP Location: Left Arm)   Pulse (!) 101   Temp (!) 96.7 F (35.9 C) (Rectal)   Resp (!) 25   SpO2 (!) 83% Comment: RN aware  Physical Exam Vitals and nursing note reviewed.  Constitutional:      General: She is not in acute distress.    Appearance: She is well-developed.  HENT:     Head: Normocephalic and atraumatic.  Eyes:     Conjunctiva/sclera: Conjunctivae normal.  Cardiovascular:     Rate and Rhythm: Normal rate and regular rhythm.     Pulses: Normal pulses.  Pulmonary:     Effort: Pulmonary effort is normal. No respiratory distress.     Comments: 95% on 4L [place] (baseline) Abdominal:     General: There is no distension.     Palpations: Abdomen is soft.  Musculoskeletal:     Cervical back: Neck supple.  Skin:    General: Skin is warm and dry.  Neurological:     Mental Status: She is alert.     ED Results /  Procedures / Treatments   Labs (all labs ordered are listed, but only abnormal results are displayed) Labs Reviewed  COMPREHENSIVE METABOLIC PANEL - Abnormal; Notable for the following components:      Result Value   Chloride 116 (*)    CO2 16 (*)    Glucose, Bld 103 (*)    BUN 50 (*)    Creatinine, Ser 2.65 (*)    Calcium 8.4 (*)    Total Protein 4.4 (*)    Albumin 1.5 (*)    GFR calc non Af Amer 15 (*)    GFR calc Af Amer 18 (*)    All other components within normal limits  CBC - Abnormal; Notable for the following components:   WBC 11.1 (*)    Hemoglobin 11.6 (*)    RDW 16.2 (*)    All other components within normal limits  POC OCCULT BLOOD, ED - Abnormal; Notable for the following components:   Fecal Occult Bld POSITIVE (*)    All other components within normal limits  TYPE AND SCREEN    EKG None  Radiology No results found.  Procedures Procedures (including critical care time)  Medications Ordered in ED Medications  sodium chloride 0.9 % bolus 1,000 mL (1,000 mLs Intravenous New Bag/Given 04/10/2019 1642)    ED Course  I have reviewed the triage vital signs and the nursing notes.  Pertinent labs & imaging results that were available during my care of the patient were reviewed by me and considered in my medical decision making (see chart for details).  86 yo female presenting from SNF with concern for blood in stool and  possible hypotension during remdesivir infusion (for recent COVID diagnosis).  BP stable here and near her discharge baseline HR stable Patient on baseline [place] and maintaining O2 sats  Airborn precautions Will check labs, hgb We have labs from yesterday to compare to - she was recently worked up for GI bleed during her hospitalization last week and stable at that time, so I suspect this may be chronic.  If there hasn't been a significant drop and she looks close to her discharge baseline from yesterday, okay to discharge her back.     Final  Clinical Impression(s) / ED Diagnoses Final diagnoses:  Lower GI bleed  Elevated serum creatinine  Acute respiratory failure with hypoxia ([place])  COVID-19    Rx / DC Orders ED Discharge Orders    None       Wyvonnia Dusky, MD 03/16/2019 [id number]

## 2019-04-05 ENCOUNTER — Encounter (HOSPITAL_COMMUNITY): Payer: Self-pay | Admitting: Internal Medicine

## 2019-04-05 ENCOUNTER — Ambulatory Visit (HOSPITAL_COMMUNITY): Payer: PPO

## 2019-04-05 LAB — COMPREHENSIVE METABOLIC PANEL
ALT: 19 U/L (ref 0–44)
AST: 23 U/L (ref 15–41)
Albumin: 1.8 g/dL — ABNORMAL LOW (ref 3.5–5.0)
Alkaline Phosphatase: 78 U/L (ref 38–126)
Anion gap: 14 (ref 5–15)
BUN: 54 mg/dL — ABNORMAL HIGH (ref 8–23)
CO2: 14 mmol/L — ABNORMAL LOW (ref 22–32)
Calcium: 8.1 mg/dL — ABNORMAL LOW (ref 8.9–10.3)
Chloride: 116 mmol/L — ABNORMAL HIGH (ref 98–111)
Creatinine, Ser: 2.76 mg/dL — ABNORMAL HIGH (ref 0.44–1.00)
GFR calc Af Amer: 17 mL/min — ABNORMAL LOW (ref >60)
GFR calc non Af Amer: 15 mL/min — ABNORMAL LOW (ref >60)
Glucose, Bld: 99 mg/dL (ref 70–99)
Potassium: 3.4 mmol/L — ABNORMAL LOW (ref 3.5–5.1)
Sodium: 144 mmol/L (ref 135–145)
Total Bilirubin: 0.5 mg/dL (ref 0.3–1.2)
Total Protein: 4.5 g/dL — ABNORMAL LOW (ref 6.5–8.1)

## 2019-04-05 LAB — CBC WITH DIFFERENTIAL/PLATELET
Abs Immature Granulocytes: 0.11 10*3/uL — ABNORMAL HIGH (ref 0.00–0.07)
Basophils Absolute: 0 10*3/uL (ref 0.0–0.1)
Basophils Relative: 0 %
Eosinophils Absolute: 0 10*3/uL (ref 0.0–0.5)
Eosinophils Relative: 0 %
HCT: 32.7 % — ABNORMAL LOW (ref 36.0–46.0)
Hemoglobin: 10.3 g/dL — ABNORMAL LOW (ref 12.0–15.0)
Immature Granulocytes: 1 %
Lymphocytes Relative: 3 %
Lymphs Abs: 0.4 10*3/uL — ABNORMAL LOW (ref 0.7–4.0)
MCH: 29.2 pg (ref 26.0–34.0)
MCHC: 31.5 g/dL (ref 30.0–36.0)
MCV: 92.6 fL (ref 80.0–100.0)
Monocytes Absolute: 0.2 10*3/uL (ref 0.1–1.0)
Monocytes Relative: 1 %
Neutro Abs: 14.8 10*3/uL — ABNORMAL HIGH (ref 1.7–7.7)
Neutrophils Relative %: 95 %
Platelets: 234 10*3/uL (ref 150–400)
RBC: 3.53 MIL/uL — ABNORMAL LOW (ref 3.87–5.11)
RDW: 16.4 % — ABNORMAL HIGH (ref 11.5–15.5)
WBC: 15.5 10*3/uL — ABNORMAL HIGH (ref 4.0–10.5)
nRBC: 0.1 % (ref 0.0–0.2)

## 2019-04-05 LAB — GLUCOSE, CAPILLARY
Glucose-Capillary: 96 mg/dL (ref 70–99)
Glucose-Capillary: 80 mg/dL (ref 70–99)
Glucose-Capillary: 63 mg/dL — ABNORMAL LOW (ref 70–99)
Glucose-Capillary: 106 mg/dL — ABNORMAL HIGH (ref 70–99)
Glucose-Capillary: 126 mg/dL — ABNORMAL HIGH (ref 70–99)
Glucose-Capillary: 112 mg/dL — ABNORMAL HIGH (ref 70–99)
Glucose-Capillary: 111 mg/dL — ABNORMAL HIGH (ref 70–99)

## 2019-04-05 LAB — CBC
HCT: 32.6 % — ABNORMAL LOW (ref 36.0–46.0)
Hemoglobin: 10.7 g/dL — ABNORMAL LOW (ref 12.0–15.0)
MCH: 30 pg (ref 26.0–34.0)
MCHC: 32.8 g/dL (ref 30.0–36.0)
MCV: 91.3 fL (ref 80.0–100.0)
Platelets: 244 10*3/uL (ref 150–400)
RBC: 3.57 MIL/uL — ABNORMAL LOW (ref 3.87–5.11)
RDW: 16.2 % — ABNORMAL HIGH (ref 11.5–15.5)
WBC: 16.5 10*3/uL — ABNORMAL HIGH (ref 4.0–10.5)
nRBC: 0 % (ref 0.0–0.2)

## 2019-04-05 LAB — C-REACTIVE PROTEIN: CRP: 13.5 mg/dL — ABNORMAL HIGH (ref <1.0)

## 2019-04-05 LAB — MAGNESIUM: Magnesium: 2.1 mg/dL (ref 1.7–2.4)

## 2019-04-05 LAB — FIBRINOGEN: Fibrinogen: 438 mg/dL (ref 210–475)

## 2019-04-05 LAB — PHOSPHORUS: Phosphorus: 5.6 mg/dL — ABNORMAL HIGH (ref 2.5–4.6)

## 2019-04-05 LAB — D-DIMER, QUANTITATIVE
D-Dimer, Quant: 12.09 ug/mL-FEU — ABNORMAL HIGH (ref 0.00–0.50)
D-Dimer, Quant: 8.93 ug/mL-FEU — ABNORMAL HIGH (ref 0.00–0.50)

## 2019-04-05 LAB — FERRITIN: Ferritin: 731 ng/mL — ABNORMAL HIGH (ref 11–307)

## 2019-04-05 LAB — TSH: TSH: 1.684 u[IU]/mL (ref 0.350–4.500)

## 2019-04-05 LAB — TROPONIN I (HIGH SENSITIVITY): Troponin I (High Sensitivity): 15 ng/L (ref <18)

## 2019-04-05 MED ORDER — METHYLPREDNISOLONE SODIUM SUCC 125 MG IJ SOLR: 125.0000 mg | Freq: Once | INTRAMUSCULAR | Status: DC | PRN

## 2019-04-05 MED ORDER — ALBUTEROL SULFATE HFA 108 (90 BASE) MCG/ACT IN AERS: 2.0000 | INHALATION_SPRAY | Freq: Once | RESPIRATORY_TRACT | Status: DC | PRN

## 2019-04-05 MED ORDER — SODIUM CHLORIDE 0.9 % IV SOLN: 100.0000 mg | Freq: Once | INTRAVENOUS | Status: DC

## 2019-04-05 MED ORDER — NORTRIPTYLINE HCL 10 MG PO CAPS
10.0000 mg | ORAL_CAPSULE | Freq: Every day | ORAL | Status: DC
  Administered 2019-04-05 – 2019-04-06 (×2): 10 mg via ORAL
  Administered 2019-04-07: 20 mg via ORAL
  Filled 2019-04-05 (×5): qty 2
  Filled 2019-04-05: qty 1

## 2019-04-05 MED ORDER — CHLORHEXIDINE GLUCONATE CLOTH 2 % EX PADS
6.0000 | MEDICATED_PAD | Freq: Every day | CUTANEOUS | Status: DC
  Administered 2019-04-05 – 2019-04-11 (×5): 6 via TOPICAL

## 2019-04-05 MED ORDER — SODIUM CHLORIDE 0.9 % IV SOLN: INTRAVENOUS | Status: DC

## 2019-04-05 MED ORDER — EPINEPHRINE 0.3 MG/0.3ML IJ SOAJ: 0.3000 mg | Freq: Once | INTRAMUSCULAR | Status: DC | PRN

## 2019-04-05 MED ORDER — TRAMADOL HCL 50 MG PO TABS
50.0000 mg | ORAL_TABLET | Freq: Four times a day (QID) | ORAL | Status: DC | PRN
  Administered 2019-04-07 (×2): 50 mg via ORAL
  Filled 2019-04-05 (×3): qty 1

## 2019-04-05 MED ORDER — SODIUM CHLORIDE 0.45 % IV SOLN: INTRAVENOUS | Status: DC

## 2019-04-05 MED ORDER — FAMOTIDINE IN NACL 20-0.9 MG/50ML-% IV SOLN: 20.0000 mg | Freq: Once | INTRAVENOUS | Status: DC | PRN

## 2019-04-05 MED ORDER — SODIUM CHLORIDE 0.9 % IV SOLN: INTRAVENOUS | Status: DC | PRN

## 2019-04-05 MED ORDER — ORAL CARE MOUTH RINSE
15.0000 mL | Freq: Two times a day (BID) | OROMUCOSAL | Status: DC
  Administered 2019-04-05 – 2019-04-11 (×13): 15 mL via OROMUCOSAL

## 2019-04-05 MED ORDER — LORATADINE 10 MG PO TABS: 10.0000 mg | ORAL_TABLET | Freq: Every day | ORAL | Status: DC | PRN

## 2019-04-05 MED ORDER — INSULIN ASPART 100 UNIT/ML ~~LOC~~ SOLN
0.0000 [IU] | SUBCUTANEOUS | Status: DC
  Administered 2019-04-06 – 2019-04-08 (×2): 1 [IU] via SUBCUTANEOUS

## 2019-04-05 MED ORDER — ONDANSETRON HCL 4 MG PO TABS: 4.0000 mg | ORAL_TABLET | Freq: Four times a day (QID) | ORAL | Status: DC | PRN

## 2019-04-05 MED ORDER — DIPHENHYDRAMINE HCL 50 MG/ML IJ SOLN: 50.0000 mg | Freq: Once | INTRAMUSCULAR | Status: DC | PRN

## 2019-04-05 MED ORDER — ACETAMINOPHEN 650 MG RE SUPP: 650.0000 mg | Freq: Four times a day (QID) | RECTAL | Status: DC | PRN

## 2019-04-05 MED ORDER — ACETAMINOPHEN 325 MG PO TABS: 650.0000 mg | ORAL_TABLET | Freq: Four times a day (QID) | ORAL | Status: DC | PRN

## 2019-04-05 MED ORDER — ALBUTEROL SULFATE HFA 108 (90 BASE) MCG/ACT IN AERS
1.0000 | INHALATION_SPRAY | RESPIRATORY_TRACT | Status: DC | PRN
  Filled 2019-04-05: qty 6.7

## 2019-04-05 MED ORDER — ONDANSETRON HCL 4 MG/2ML IJ SOLN: 4.0000 mg | Freq: Four times a day (QID) | INTRAMUSCULAR | Status: DC | PRN

## 2019-04-05 MED ORDER — HEPARIN SODIUM (PORCINE) 5000 UNIT/ML IJ SOLN
5000.0000 [IU] | Freq: Three times a day (TID) | INTRAMUSCULAR | Status: DC
  Administered 2019-04-05 – 2019-04-08 (×9): 5000 [IU] via SUBCUTANEOUS
  Filled 2019-04-05 (×9): qty 1

## 2019-04-05 NOTE — ED Notes (Signed)
Assisted RN Lanelle Bal with getting pt cleaned up after a BM that appeared similar to coffee grounds. Pt is unable to roll over w/out assistance.  Pt was cleaned and barrier cream applied.  [name] perineum is red and irritated and pt complains of pain.  She endorses relief with barrier cream.  Purewick not reapplied due to irritation.  Brief placed under pt but left un-secured.

## 2019-04-05 NOTE — Progress Notes (Signed)
Palliative Care Readmission Note  Patient seen on prior hospitalization 1/7 by our inpatient PC service. She was set up with hospice services and ready to discharge on 1/8 when she developed an acute bowel obstructions and underwent a ex lap for lysis of adhesions for recurrent metastatic colon cancer that is terminal and not treatable. She recovered from the surgery and was ready to be discharged to SNF when her covid test came back positive on 1/17 and she was moved to [facility]. Palliative care available to assist with goals of care discussion, please consider re-consulting [place] given change in her status and condition.  Lane Hacker, DO Palliative Medicine [phone]

## 2019-04-05 NOTE — Plan of Care (Signed)
Pt to return to baseline, VS remain stable and free from falls and injury throughout shift.

## 2019-04-05 NOTE — Progress Notes (Signed)
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  Recent history of exploratory laparotomy on 03/27/2019 that showed metastatic adenocarcinoma with colon being likely primary.  She was made DNR after discussion with palliative care medicine during the last hospitalization.  She was discharged to SNF on 04/03/2019 with plan to follow-up as outpatient for remdesivir infusions.  When she arrived for her infusion yesterday, she was tachypneic with increasing oxygen requirements and therefore sent to [facility], ER.  Since she has been on 10 L oxygen via HFNC.  There was also some concern about lower GI bleed in setting of her cancer diagnosis.   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  Likely secondary to COVID-19 pneumonia.  Tested positive on 04/01/2019. Continue steroids, remdesivir Trend inflammatory markers Attempt to maintain  euvolemia Zinc, vitamin C Albuterol inhaler  Recurrent partial SBO s/p ex lap with partial small bowel resection Metastatic adenocarcinoma  AKI on CKD stage IV It appears in the last month her creatinine was gradually up trended from her baseline of 1.5-1.8 Creatinine elevated to 2.76.  Likely prerenal Continue gentle hydration  Overall prognosis is guarded given age, metastatic cancer, recurrent hospitalizations for SBO, COVID-19 infection.  DVT prophylaxis: Heparin SQ  Code Status: DNR  Family Communication: Called point of contact listed on the chart Manuela Schwartz.  She is a friend and not patient's HCPOA.  She will get in touch with patient's nephew Nicki Reaper and ask him to give the hospital call. Disposition Plan: Plan for transfer to [facility]   Consultants:   None  Procedures:  None  Antimicrobials:   None   Subjective: Confused.  Denies nausea abdominal pain.  Complains of dyspnea.  Objective: Vitals:   04/05/19 0445 04/05/19 0500 04/05/19 0600 04/05/19 0700  BP:  139/70 (!) 153/76 (!) 159/83  Pulse: 100 95 98 (!) 102  Resp: (!) 26 15 16  (!) 22  Temp:      TempSrc:      SpO2: 97% 98% 98% 95%    Intake/Output Summary (Last 24 hours) at 04/05/2019 0749 Last data filed at 03/23/2019 1930 Gross per 24 hour  Intake 999 ml  Output --  Net 999 ml   There were no vitals filed for this visit.  Examination:  General exam: Elderly, tired appearing, anxious Respiratory system: Clear to auscultation.  Tachypneic with mild accessory muscle use Cardiovascular system: S1 & S2 heard, RRR. No JVD, murmurs.  2+ pitting edema  Gastrointestinal system: Abdomen is nondistended, soft and nontender. Normal bowel sounds heard. Central nervous system: Alert and oriented to self only. No obvious focal neurological deficits. Extremities: Moves all extremities voluntarily Skin: No rashes, lesions or ulcers Psychiatry: Unable to assess  Data Reviewed: I have personally reviewed following labs  and imaging studies  CBC: Recent Labs  Lab 04/02/19 1215 04/03/19 0524 04/01/2019 1420 04/05/19 0013 04/05/19 0144  WBC 8.6 8.7 11.1* 16.5* 15.5*  NEUTROABS 7.8* 8.0*  --   --  14.8*  HGB 12.1 11.5* 11.6* 10.7* 10.3*  HCT 38.0 36.6 37.1 32.6* 32.7*  MCV 94.1 93.8 93.0 91.3 92.6  PLT 178 188 288 244 017   Basic Metabolic Panel: Recent Labs  Lab 03/30/19 0007 03/31/19 0753 04/01/19 0623 04/02/19 1215 04/03/19 0524 04/09/2019 1420 04/05/19 0144  NA 136   < > 142 142 142 144 144  K 3.4*   < > 3.5 3.1* 3.1* 3.7 3.4*  CL 112*   < > 118* 119* 116* 116* 116*  CO2 16*   < > 16* 17* 17* 16* 14*  GLUCOSE 85   < > 86 104* 136* 103* 99  BUN 40*   < > 39* 37* 39* 50* 54*  CREATININE 2.28*   < > 2.02* 2.03* 2.12* 2.65* 2.76*  CALCIUM 7.1*   < > 7.7* 7.7* 7.8* 8.4* 8.1*  MG 1.7  --   --  1.5* 2.0  --  2.1  PHOS 3.1  --   --  4.1 5.0*  --  5.6*   < > = values in this interval not displayed.   GFR: Estimated Creatinine Clearance: 12.2 mL/min (A) (by C-G formula based on SCr of 2.76 mg/dL (H)). Liver Function Tests: Recent Labs  Lab 04/02/19 1215 04/03/19 0524 04/11/2019 1420 04/05/19 0144  AST 20 19 25 23   ALT 18 17 19 19   ALKPHOS 80 74 106 78  BILITOT 0.6 0.4 0.6 0.5  PROT 4.3* 4.2* 4.4* 4.5*  ALBUMIN 1.4* 1.3* 1.5* 1.8*   No results for input(s): LIPASE, AMYLASE in the last 168 hours. No results for input(s): AMMONIA in the last 168 hours. Coagulation Profile: No results for input(s): INR, PROTIME in the last 168 hours. Cardiac Enzymes: No results for input(s): CKTOTAL, CKMB, CKMBINDEX, TROPONINI in the last 168 hours. BNP (last 3 results) No results for input(s): PROBNP in the last 8760 hours. HbA1C: Recent Labs    04/02/19 1632  HGBA1C 5.7*   CBG: Recent Labs  Lab 04/02/19 2135 04/03/19 0748 04/03/19 1124 04/03/19 1652 04/05/19 0326  GLUCAP 91 139* 121* 80 96   Lipid Profile: No results for input(s): CHOL, HDL, LDLCALC, TRIG, CHOLHDL, LDLDIRECT in the last  72 hours. Thyroid Function Tests: Recent Labs    04/05/19 0351  TSH 1.684   Anemia Panel: Recent Labs    04/07/2019 2013 04/05/19 0144  FERRITIN 630* 731*   Sepsis Labs: Recent Labs  Lab 04/13/2019 2017  PROCALCITON 2.84    Recent Results (from the past 240 hour(s))  Surgical pcr screen     Status: None   Collection Time: 03/26/19  6:46 PM   Specimen: Nasal Mucosa; Nasal Swab  Result Value Ref Range Status   MRSA, PCR NEGATIVE NEGATIVE Final   Staphylococcus aureus NEGATIVE NEGATIVE Final    Comment: (NOTE) The Xpert SA Assay (FDA approved for NASAL specimens in patients 55 years of age and older), is one component of a comprehensive surveillance program. It is not intended to diagnose  infection nor to guide or monitor treatment. Performed at [facility], [address], [place], [place] [address]   SARS CORONAVIRUS 2 (TAT 6-24 HRS) Nasopharyngeal Nasopharyngeal Swab     Status: Abnormal   Collection Time: 04/01/19  1:10 PM   Specimen: Nasopharyngeal Swab  Result Value Ref Range Status   SARS Coronavirus 2 POSITIVE (A) NEGATIVE Final    Comment: RESULT CALLED TO, READ BACK BY AND VERIFIED WITH: R. BONDURANT,RN 0301 04/02/2019 T. TYSOR (NOTE) SARS-CoV-2 target nucleic acids are DETECTED. The SARS-CoV-2 RNA is generally detectable in upper and lower respiratory specimens during the acute phase of infection. Positive results are indicative of the presence of SARS-CoV-2 RNA. Clinical correlation with patient history and other diagnostic information is  necessary to determine patient infection status. Positive results do not rule out bacterial infection or co-infection with other viruses.  The expected result is Negative. Fact Sheet for Patients: SugarRoll.be Fact Sheet for Healthcare Providers: https://www.woods-mathews.com/ This test is not yet approved or cleared by the [place] FDA and  has been authorized for detection  and/or diagnosis of SARS-CoV-2 by FDA under an Emergency Use Authorization (EUA). This EUA will remain  in effect (meaning this test can be used) fo r the duration of the COVID-19 declaration under Section 564(b)(1) of the Act, 21 U.S.C. section 360bbb-3(b)(1), unless the authorization is terminated or revoked sooner. Performed at [facility], [place] [address], [place], [place] [address]   Respiratory Panel by RT PCR (Flu A&B, Covid) - Nasopharyngeal Swab     Status: Abnormal   Collection Time: 04/02/19 11:04 AM   Specimen: Nasopharyngeal Swab  Result Value Ref Range Status   SARS Coronavirus 2 by RT PCR POSITIVE (A) NEGATIVE Final    Comment: RESULT CALLED TO, READ BACK BY AND VERIFIED WITH: WRIGHT E. AT 1243 ON [id number] BY THOMPSON S.    Influenza A by PCR NEGATIVE NEGATIVE Final   Influenza B by PCR NEGATIVE NEGATIVE Final    Comment: (NOTE) The Xpert Xpress SARS-CoV-2/FLU/RSV assay is intended as an aid in  the diagnosis of influenza from Nasopharyngeal swab specimens and  should not be used as a sole basis for treatment. Nasal washings and  aspirates are unacceptable for Xpert Xpress SARS-CoV-2/FLU/RSV  testing. Fact Sheet for Patients: PinkCheek.be Fact Sheet for Healthcare Providers: GravelBags.it This test is not yet approved or cleared by the [place] FDA and  has been authorized for detection and/or diagnosis of SARS-CoV-2 by  FDA under an Emergency Use Authorization (EUA). This EUA will remain  in effect (meaning this test can be used) for the duration of the  Covid-19 declaration under Section 564(b)(1) of the Act, 21  U.S.C. section 360bbb-3(b)(1), unless the authorization is  terminated or revoked. Performed at [facility], [address], [place], [place] [address]          Radiology Studies: DG CHEST PORT 1 VIEW  Result Date: 03/31/2019 CLINICAL DATA:  Shortness of breath. EXAM: PORTABLE  CHEST 1 VIEW COMPARISON:  April 02, 2019 FINDINGS: There is stable right-sided PICC line positioning. Mild diffusely increased interstitial lung markings are seen. This is most prominent within the bilateral lung bases, left greater than right. The heart size and mediastinal contours are within normal limits. Radiopaque surgical clips are seen within the soft tissues of the right breast. The visualized skeletal structures are unremarkable. IMPRESSION: 1. No significant interval change in bilateral interstitial opacities when compared to the prior study dated April 02, 2019. Electronically  Signed   By: Virgina Norfolk M.D.   On: 03/24/2019 20:50        Scheduled Meds: . Chlorhexidine Gluconate Cloth  6 each Topical Daily  . dexamethasone (DECADRON) injection  6 mg Intravenous Q24H  . insulin aspart  0-9 Units Subcutaneous Q4H  . mouth rinse  15 mL Mouth Rinse BID  . nortriptyline  10-20 mg Oral QHS  . pantoprazole (PROTONIX) IV  40 mg Intravenous QHS   Continuous Infusions: . sodium chloride 75 mL/hr at 04/05/19 0207  . remdesivir 200 mg in sodium chloride 0.9% 250 mL IVPB Stopped (03/21/2019 2057)   Followed by  . remdesivir 100 mg in NS 100 mL 100 mg (04/07/2019 2137)     LOS: 1 day    Time spent: Spent more than 30 minutes in coordinating care for this patient including bedside patient care.     Lucky Cowboy, MD [facility] If 7PM-7AM, please contact night-coverage 04/05/2019, 7:49 AM

## 2019-04-06 ENCOUNTER — Ambulatory Visit (HOSPITAL_COMMUNITY): Payer: PPO

## 2019-04-06 ENCOUNTER — Inpatient Hospital Stay: Payer: Self-pay

## 2019-04-06 DIAGNOSIS — N179 Acute kidney failure, unspecified: Secondary | ICD-10-CM

## 2019-04-06 DIAGNOSIS — C799 Secondary malignant neoplasm of unspecified site: Secondary | ICD-10-CM

## 2019-04-06 LAB — CBC WITH DIFFERENTIAL/PLATELET
Abs Immature Granulocytes: 0.17 10*3/uL — ABNORMAL HIGH (ref 0.00–0.07)
Basophils Absolute: 0 10*3/uL (ref 0.0–0.1)
Basophils Relative: 0 %
Eosinophils Absolute: 0 10*3/uL (ref 0.0–0.5)
Eosinophils Relative: 0 %
HCT: 32.2 % — ABNORMAL LOW (ref 36.0–46.0)
Hemoglobin: 10.5 g/dL — ABNORMAL LOW (ref 12.0–15.0)
Immature Granulocytes: 1 %
Lymphocytes Relative: 3 %
Lymphs Abs: 0.5 10*3/uL — ABNORMAL LOW (ref 0.7–4.0)
MCH: 29.3 pg (ref 26.0–34.0)
MCHC: 32.6 g/dL (ref 30.0–36.0)
MCV: 89.9 fL (ref 80.0–100.0)
Monocytes Absolute: 0.3 10*3/uL (ref 0.1–1.0)
Monocytes Relative: 2 %
Neutro Abs: 15 10*3/uL — ABNORMAL HIGH (ref 1.7–7.7)
Neutrophils Relative %: 94 %
Platelets: 267 10*3/uL (ref 150–400)
RBC: 3.58 MIL/uL — ABNORMAL LOW (ref 3.87–5.11)
RDW: 16.3 % — ABNORMAL HIGH (ref 11.5–15.5)
WBC: 15.9 10*3/uL — ABNORMAL HIGH (ref 4.0–10.5)
nRBC: 0.1 % (ref 0.0–0.2)

## 2019-04-06 LAB — COMPREHENSIVE METABOLIC PANEL
ALT: 20 U/L (ref 0–44)
AST: 27 U/L (ref 15–41)
Albumin: 1.8 g/dL — ABNORMAL LOW (ref 3.5–5.0)
Alkaline Phosphatase: 93 U/L (ref 38–126)
Anion gap: 14 (ref 5–15)
BUN: 55 mg/dL — ABNORMAL HIGH (ref 8–23)
CO2: 14 mmol/L — ABNORMAL LOW (ref 22–32)
Calcium: 8.1 mg/dL — ABNORMAL LOW (ref 8.9–10.3)
Chloride: 116 mmol/L — ABNORMAL HIGH (ref 98–111)
Creatinine, Ser: 2.94 mg/dL — ABNORMAL HIGH (ref 0.44–1.00)
GFR calc Af Amer: 16 mL/min — ABNORMAL LOW (ref >60)
GFR calc non Af Amer: 14 mL/min — ABNORMAL LOW (ref >60)
Glucose, Bld: 99 mg/dL (ref 70–99)
Potassium: 3.1 mmol/L — ABNORMAL LOW (ref 3.5–5.1)
Sodium: 144 mmol/L (ref 135–145)
Total Bilirubin: 0.4 mg/dL (ref 0.3–1.2)
Total Protein: 4.6 g/dL — ABNORMAL LOW (ref 6.5–8.1)

## 2019-04-06 LAB — D-DIMER, QUANTITATIVE: D-Dimer, Quant: 11.35 ug/mL-FEU — ABNORMAL HIGH (ref 0.00–0.50)

## 2019-04-06 LAB — MAGNESIUM: Magnesium: 2 mg/dL (ref 1.7–2.4)

## 2019-04-06 LAB — GLUCOSE, CAPILLARY
Glucose-Capillary: 100 mg/dL — ABNORMAL HIGH (ref 70–99)
Glucose-Capillary: 120 mg/dL — ABNORMAL HIGH (ref 70–99)
Glucose-Capillary: 79 mg/dL (ref 70–99)
Glucose-Capillary: 85 mg/dL (ref 70–99)
Glucose-Capillary: 88 mg/dL (ref 70–99)

## 2019-04-06 LAB — FERRITIN: Ferritin: 676 ng/mL — ABNORMAL HIGH (ref 11–307)

## 2019-04-06 LAB — PHOSPHORUS: Phosphorus: 5.3 mg/dL — ABNORMAL HIGH (ref 2.5–4.6)

## 2019-04-06 LAB — C-REACTIVE PROTEIN: CRP: 17.7 mg/dL — ABNORMAL HIGH (ref <1.0)

## 2019-04-06 MED ORDER — SODIUM BICARBONATE 8.4 % IV SOLN
INTRAVENOUS | Status: DC
  Filled 2019-04-06 (×7): qty 100

## 2019-04-06 MED ORDER — POTASSIUM CHLORIDE CRYS ER 20 MEQ PO TBCR
20.0000 meq | EXTENDED_RELEASE_TABLET | Freq: Once | ORAL | Status: AC
  Administered 2019-04-06: 09:00:00 20 meq via ORAL
  Filled 2019-04-06: qty 1

## 2019-04-06 NOTE — Progress Notes (Addendum)
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  Recent history of exploratory laparotomy on 03/27/2019 that showed metastatic adenocarcinoma with colon being likely primary.  She was made DNR after discussion with palliative care medicine during the last hospitalization.  She was discharged to SNF on 04/03/2019 with plan to follow-up as outpatient for remdesivir infusions.  When she arrived for her infusion on 1/20, she was tachypneic with increasing oxygen requirements and therefore sent to [facility], ER.  Since she has been on 10 L oxygen via HFNC.  There was also some concern about lower GI bleed in setting of her cancer diagnosis.   Reason for Visit: Acute respiratory failure with hypoxia.  Pneumonia due to COVID-19.  Consultants: Palliative medicine  Procedures: None  Antibiotics: Anti-infectives (From admission, onward)   Start     Dose/Rate Route Frequency Ordered Stop   04/05/19 1045  remdesivir 100 mg in sodium chloride 0.9 % 100 mL IVPB  Status:  Discontinued     100 mg 200 mL/hr over 30 Minutes Intravenous  Once 04/05/19 1031 04/05/19 1038   04/05/19 1045  remdesivir 100 mg in sodium chloride 0.9 % 100 mL IVPB  Status:  Discontinued     100 mg 200 mL/hr over 30 Minutes Intravenous  Once 04/05/19 1031 04/05/19 1040   03/16/2019 2130  remdesivir 100 mg in sodium chloride 0.9 % 100 mL IVPB     100 mg 200 mL/hr over 30 Minutes Intravenous Daily 03/16/2019 2015 04/06/19 0918   04/08/2019 2100  remdesivir 200 mg in sodium  chloride 0.9% 250 mL IVPB     200 mg 580 mL/hr over 30 Minutes Intravenous Once 03/21/2019 2015        Subjective/Interval History: Patient noted to be lethargic this morning.  Somewhat distracted.  Understands that she is really sick.  Understands that her prognosis is poor.  Complains of abdominal pain.  She has been making urine per nursing staff.    Assessment/Plan:  Acute Hypoxic Resp. Failure/Pneumonia due to COVID-19  Recent Labs  Lab 04/02/19 1215 04/03/19 0524 03/31/2019 1420 04/03/2019 2013 03/30/2019 2017 04/05/19 0144 04/06/19 0117  DDIMER 9.73* 9.54*  --   --  12.09* 8.93* 11.35*  FERRITIN 918* 798*  --  630*  --  731* 676*  CRP 20.5* 19.0*  --  12.5*  --  13.5* 17.7*  ALT 18 17 19   --   --  19 20  PROCALCITON  --   --   --   --  2.84  --   --     Objective findings: Fever: Noted to be afebrile Oxygen requirements: High flow nasal cannula.  5 L/min.  Saturating in the early 90s  COVID 19 Therapeutics: Antibacterials: None Remdesivir: Completed course of remdesivir today Steroids: Dexamethasone 6 mg daily Diuretics: Not on diuretics Actemra: Not given Convalescent Plasma: Not given PUD Prophylaxis: On Protonix DVT Prophylaxis: Subcutaneous heparin  Patient continues to require oxygen.  She is on 5 L.  Her CRP is still elevated.  D-dimer is also noted to be high.  Respiratory status is tenuous.  She has  completed course of remdesivir.  She remains on steroids.  Poor candidate for Actemra and convalescent plasma.  Elevated D-dimer is concerning in the setting of metastatic cancer.  However her overall prognosis seems to be poor.  We will hold off on aggressive work-up at this time.  Palliative medicine has been consulted to assist.  Acute on chronic kidney disease stage IV/hyperkalemia/metabolic acidosis Baseline creatinine around 1.5-1.8.  Creatinine noted to be elevated at admission.  Continues to increase.  Continue with IV fluids.  Monitor urine output.  Bladder  scan.  Replace potassium.  Initiate sodium bicarbonate.  Metastatic adenocarcinoma Patient with a history of colon cancer.  Presented with recurrent small bowel obstruction.  She underwent exploratory laparotomy and was found to have peritoneal implants.  Biopsy was done.  Pathology confirms metastatic cancer.  Previous discussions with palliative medicine have been held.  Patient did not want any treatment for her cancer.  In view of COVID-19, respiratory failure and renal failure her prognosis appears to be very poor.  Patient might be appropriate for transition to hospice and comfort care.  History of recurrent small bowel obstruction See above.  Normocytic anemia Hemoglobin stable.  Some concern for GI bleed due to her cancer but no overt bleeding has been noted.  Goals of care Managed to get a hold of her nephew [name] [phone], [phone]).  Apparently patient does have a sister but she is estranged.  He is the other closest relative that she has.  Patient does not have any children or spouse.  He knows that the patient has been declining over the last few months.  He was told about her worsening clinical status.  He understands.  He is agreeable to try current approach for 24 hours.  If she declines or does not improve then we will transition her to comfort care/hospice.  DVT Prophylaxis: Subcutaneous heparin Code Status: DNR Family Communication: Apparently her nephew is the only next of kin.  Unfortunately we do not have any contact information for him.  We will try to ascertain this today. Disposition Plan: From skilled nursing facility.  Prognosis is poor.   Medications:  Scheduled: . Chlorhexidine Gluconate Cloth  6 each Topical Daily  . dexamethasone (DECADRON) injection  6 mg Intravenous Q24H  . heparin injection (subcutaneous)  5,000 Units Subcutaneous Q8H  . insulin aspart  0-9 Units Subcutaneous Q4H  . mouth rinse  15 mL Mouth Rinse BID  . nortriptyline  10-20  mg Oral QHS  . pantoprazole (PROTONIX) IV  40 mg Intravenous QHS   Continuous: . remdesivir 200 mg in sodium chloride 0.9% 250 mL IVPB Stopped (03/26/2019 2057)   YHC:[id number] **OR** acetaminophen, albuterol, guaiFENesin-dextromethorphan, loratadine, ondansetron **OR** ondansetron (ZOFRAN) IV, traMADol   Objective:  Vital Signs  Vitals:   04/05/19 2000 04/06/19 0700 04/06/19 1021 04/06/19 1134  BP:  140/74  (!) 142/79  Pulse:  (!) 110  (!) 101  Resp: (!) 22 (!) 22  19  Temp:  (!) 96.1 F (35.6 C)  (!) 96.1 F (35.6 C)  TempSrc:  Axillary  Axillary  SpO2:  91% 96% 94%    Intake/Output Summary (Last 24 hours) at 04/06/2019 1219 Last data filed at 04/05/2019 1600 Gross per 24 hour  Intake 240.93 ml  Output --  Net 240.93 ml   There were no vitals filed for this visit.  General appearance: She is awake alert.  Slightly distracted.  Appears to be in discomfort. Resp: Tachypneic.  Crackles bilateral bases.  No wheezing or rhonchi. Cardio: S1-S2 is tachycardic regular.  No S3-S4.  No rubs or bruit. GI: Abdomen is soft but distended.  Tender in the lower abdomen without any rebound rigidity or guarding.  No masses organomegaly. Extremities: Lower extremity edema noted.   Neurologic: No obvious focal neurological deficits noted.   Lab Results:  Data Reviewed: I have personally reviewed following labs and imaging studies  CBC: Recent Labs  Lab 04/02/19 1215 04/02/19 1215 04/03/19 0524 03/24/2019 1420 04/05/19 0013 04/05/19 0144 04/06/19 0117  WBC 8.6   < > 8.7 11.1* 16.5* 15.5* 15.9*  NEUTROABS 7.8*  --  8.0*  --   --  14.8* 15.0*  HGB 12.1   < > 11.5* 11.6* 10.7* 10.3* 10.5*  HCT 38.0   < > 36.6 37.1 32.6* 32.7* 32.2*  MCV 94.1   < > 93.8 93.0 91.3 92.6 89.9  PLT 178   < > 188 288 244 234 267   < > = values in this interval not displayed.    Basic Metabolic Panel: Recent Labs  Lab 04/02/19 1215 04/03/19 0524 04/01/2019 1420 04/05/19 0144 04/06/19 0117  NA  142 142 144 144 144  K 3.1* 3.1* 3.7 3.4* 3.1*  CL 119* 116* 116* 116* 116*  CO2 17* 17* 16* 14* 14*  GLUCOSE 104* 136* 103* 99 99  BUN 37* 39* 50* 54* 55*  CREATININE 2.03* 2.12* 2.65* 2.76* 2.94*  CALCIUM 7.7* 7.8* 8.4* 8.1* 8.1*  MG 1.5* 2.0  --  2.1 2.0  PHOS 4.1 5.0*  --  5.6* 5.3*    GFR: Estimated Creatinine Clearance: 11.4 mL/min (A) (by C-G formula based on SCr of 2.94 mg/dL (H)).  Liver Function Tests: Recent Labs  Lab 04/02/19 1215 04/03/19 0524 04/09/2019 1420 04/05/19 0144 04/06/19 0117  AST 20 19 25 23 27   ALT 18 17 19 19 20   ALKPHOS 80 74 106 78 93  BILITOT 0.6 0.4 0.6 0.5 0.4  PROT 4.3* 4.2* 4.4* 4.5* 4.6*  ALBUMIN 1.4* 1.3* 1.5* 1.8* 1.8*    CBG: Recent Labs  Lab 04/05/19 2103 04/05/19 2357 04/06/19 0407 04/06/19 0713 04/06/19 1056  GLUCAP 111* 120* 85 88 79    Thyroid Function Tests: Recent Labs    04/05/19 0351  TSH 1.684    Anemia Panel: Recent Labs    04/05/19 0144 04/06/19 0117  FERRITIN 731* 676*    Recent Results (from the past 240 hour(s))  SARS CORONAVIRUS 2 (TAT 6-24 HRS) Nasopharyngeal Nasopharyngeal Swab     Status: Abnormal   Collection Time: 04/01/19  1:10 PM   Specimen: Nasopharyngeal Swab  Result Value Ref Range Status   SARS Coronavirus 2 POSITIVE (A) NEGATIVE Final    Comment: RESULT CALLED TO, READ BACK BY AND VERIFIED WITH: R. BONDURANT,RN 0301 04/02/2019 T. TYSOR (NOTE) SARS-CoV-2 target nucleic acids are DETECTED. The SARS-CoV-2 RNA is generally detectable in upper and lower respiratory specimens during the acute phase of infection. Positive results are indicative of the presence of SARS-CoV-2 RNA. Clinical correlation with patient history and other diagnostic information is  necessary to determine patient infection status. Positive results do not rule out bacterial infection or co-infection with other viruses.  The expected result is Negative. Fact Sheet for  Patients: SugarRoll.be Fact Sheet for Healthcare Providers: https://www.woods-mathews.com/ This test is not yet approved or cleared by the [place] FDA and  has been authorized for detection and/or diagnosis of SARS-CoV-2 by FDA under an Emergency Use Authorization (EUA). This EUA will  remain  in effect (meaning this test can be used) fo r the duration of the COVID-19 declaration under Section 564(b)(1) of the Act, 21 U.S.C. section 360bbb-3(b)(1), unless the authorization is terminated or revoked sooner. Performed at [facility], [place] [address] [place], [place] [address]   Respiratory Panel by RT PCR (Flu A&B, Covid) - Nasopharyngeal Swab     Status: Abnormal   Collection Time: 04/02/19 11:04 AM   Specimen: Nasopharyngeal Swab  Result Value Ref Range Status   SARS Coronavirus 2 by RT PCR POSITIVE (A) NEGATIVE Final    Comment: RESULT CALLED TO, READ BACK BY AND VERIFIED WITH: WRIGHT E. AT 1243 ON [id number] BY THOMPSON S.    Influenza A by PCR NEGATIVE NEGATIVE Final   Influenza B by PCR NEGATIVE NEGATIVE Final    Comment: (NOTE) The Xpert Xpress SARS-CoV-2/FLU/RSV assay is intended as an aid in  the diagnosis of influenza from Nasopharyngeal swab specimens and  should not be used as a sole basis for treatment. Nasal washings and  aspirates are unacceptable for Xpert Xpress SARS-CoV-2/FLU/RSV  testing. Fact Sheet for Patients: PinkCheek.be Fact Sheet for Healthcare Providers: GravelBags.it This test is not yet approved or cleared by the [place] FDA and  has been authorized for detection and/or diagnosis of SARS-CoV-2 by  FDA under an Emergency Use Authorization (EUA). This EUA will remain  in effect (meaning this test can be used) for the duration of the  Covid-19 declaration under Section 564(b)(1) of the Act, 21  U.S.C. section 360bbb-3(b)(1), unless the  authorization is  terminated or revoked. Performed at [facility], [address], [place], [place] [address]       Radiology Studies: DG CHEST PORT 1 VIEW  Result Date: 04/05/2019 CLINICAL DATA:  Shortness of breath. EXAM: PORTABLE CHEST 1 VIEW COMPARISON:  April 02, 2019 FINDINGS: There is stable right-sided PICC line positioning. Mild diffusely increased interstitial lung markings are seen. This is most prominent within the bilateral lung bases, left greater than right. The heart size and mediastinal contours are within normal limits. Radiopaque surgical clips are seen within the soft tissues of the right breast. The visualized skeletal structures are unremarkable. IMPRESSION: 1. No significant interval change in bilateral interstitial opacities when compared to the prior study dated April 02, 2019. Electronically Signed   By: Virgina Norfolk M.D.   On: 04/05/2019 20:50       LOS: 2 days   [facility] Pager on [email or web address]  04/06/2019, 12:19 PM

## 2019-04-06 NOTE — Progress Notes (Addendum)
2030 Pt confused pulled PICC line out EKG leads (PICC line tip in tact), clothes, O2, off. Charge aware and text message sent to MD.  2110 Spoke to pt nephew [name] 2130 MD returned page. To put in order for PICC team hold IV meds at this time and can try US guided IV. Will continue to monitor 2135 Dorothea Ogle RN in pt room for US Guided IV. 0600 Lab tech on floor notified pt no longer has a PICC, lab said she'll be back to draw labs on pt by 0800

## 2019-04-06 NOTE — TOC Initial Note (Signed)
Transition of Care [facility]) - Initial/Assessment Note    Patient Details  Name: [name] MRN: [id number] Date of Birth: 1928-05-31  Transition of Care [facility]) CM/SW Contact:    Leeroy Cha, RN Phone Number: 04/06/2019, 11:51 AM  Clinical Narrative:                 Patient  Is from the [facility] in [place], plan is to return there.  Expected Discharge Plan: [place] Barriers to Discharge: Continued Medical Work up   Patient Goals and CMS Choice        Expected Discharge Plan and Services Expected Discharge Plan: [place]         Expected Discharge Date: (unknown)                                    Prior Living Arrangements/Services                       Activities of Daily Living Home Assistive Devices/Equipment: Eyeglasses, Dentures (specify type), Walker (specify type), Cane (specify quad or straight), Oxygen(rollator, single point cane, upper/lower dentures-[facility] has necessary equipment for their patients) ADL Screening (condition at time of admission) Patient's cognitive ability adequate to safely complete daily activities?: Yes Is the patient deaf or have difficulty hearing?: No Does the patient have difficulty seeing, even when wearing glasses/contacts?: No Does the patient have difficulty concentrating, remembering, or making decisions?: No Patient able to express need for assistance with ADLs?: Yes Does the patient have difficulty dressing or bathing?: Yes Independently performs ADLs?: No Communication: Independent Dressing (OT): Needs assistance Is this a change from baseline?: Pre-admission baseline Grooming: Independent Feeding: Needs assistance Is this a change from baseline?: Pre-admission baseline Bathing: Needs assistance Is this a change from baseline?: Pre-admission baseline Toileting: Needs assistance Is this a change from baseline?: Pre-admission baseline In/Out Bed: Needs  assistance Is this a change from baseline?: Pre-admission baseline Walks in Home: Needs assistance Is this a change from baseline?: Pre-admission baseline Does the patient have difficulty walking or climbing stairs?: Yes(secondary to weakness) Weakness of Legs: Both Weakness of Arms/Hands: None  Permission Sought/Granted                  Emotional Assessment              Admission diagnosis:  Acute respiratory failure ([place]) [J96.00] Lower GI bleed [K92.2] Hypoxia [R09.02] Elevated serum creatinine [R79.89] Acute respiratory failure with hypoxia ([place]) [J96.01] COVID-19 [U07.1] Patient Active Problem List   Diagnosis Date Noted  . Acute respiratory failure ([place]) 04/09/2019  . Pneumonia due to COVID-19 virus 04/02/2019  . Acute respiratory disease due to COVID-19 virus 04/02/2019  . Recurrent partial small bowel obstruction 03/25/2019  . Small bowel obstruction ([place]) 03/24/2019  . Goals of care, counseling/discussion   . Palliative care by specialist   . Encounter for hospice care discussion   . Anemia   . Gastrointestinal hemorrhage   . Nausea vomiting and diarrhea   . Guaiac positive stools 03/21/2019  . DNR (do not resuscitate) 03/21/2019  . SBO (small bowel obstruction) ([place]) 03/08/2019  . 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 Lower GI bleed from cecal cancer 12/10/2018  . S/P colon resection 12/08/2018  . Iron deficiency anemia due to chronic blood loss   . Urothelial carcinoma of left distal ureter ([place])   . Cancer of  cecum s/p proximal right colectomy 12/08/2018 12/05/2018  . CKD (chronic kidney disease), stage IV ([place]) 12/05/2018  . Acute anemia 12/04/2018  . Ureteral mass 05/11/2018  . Bladder cancer ([place]) 03/20/2018  . 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 Chronic respiratory failure with hypoxia ([place]) 08/13/2016  . Pulmonary nodule 07/19/2016  . Renal bruit 06/29/2016  . Dyspnea  05/04/2016  . COPD (chronic obstructive pulmonary disease) ([place]) 04/26/2016  . Breast cancer, right breast ([place]) 05/01/2015  . Osteoarthritis of both knees 04/21/2015  . Acute bronchitis with COPD ([place]) 03/21/2015  . Abnormal CXR 01/23/2015  . Prediabetes 10/15/2014  . Osteopenia 09/18/2013  . Peripheral neuropathy 08/13/2013  . Hyperglycemia 08/13/2013  . Essential hypertension, benign 09/20/2012  . Carotid artery disease ([place]) 09/20/2012  . Hyperlipidemia 09/20/2012  . Arthritis of right knee 06/08/2007  . KNEE PAIN 06/08/2007   PCP:  Kathyrn Drown, MD Pharmacy:   [facility] [place] [address] Phone: [phone] Fax: [phone]     Social Determinants of Health (SDOH) Interventions    Readmission Risk Interventions Readmission Risk Prevention Plan 04/03/2019 03/12/2019  Transportation Screening Complete Complete  Medication Review Press photographer) Complete Complete  PCP or Specialist appointment within 3-5 days of discharge Complete Complete  HRI or Home Care Consult Patient refused Patient refused  SW Recovery Care/Counseling Consult Patient refused Patient refused  Palliative Care Screening Not Applicable Not [place] Complete Not Applicable  Some recent data might be hidden

## 2019-04-06 NOTE — Evaluation (Addendum)
Physical Therapy Evaluation Patient Details Name: [name] MRN: [id number] DOB: 05/09/1928 Today's Date: 04/06/2019   History of Present Illness  [name] is a 86 y.o. female with medical history significant of cecal adenocarcinoma, hypertension, urothelial carcinoma, breast cancer, COPD, prediabetes who presented to the ER with abdominal pain.  Patient was recently admitted here with a partial small bowel obstruction from 03/08/2019-03/12/2019.  She was discharged after tolerating oral feeds and having multiple bowel movements.  She states she has had recurrent symptoms in the last 24 hours with increased abdominal pain, distention, nausea, reduced appetite.  Unable to tolerate anything p.o. in the last 24 hours.  Feels really weak and dehydrated.  Contacted her PCP who recommended coming back to the ER.  Lives alone and is usually independent at baseline.  Currently having difficulty caring for herself because of weakness.  Clinical Impression  The patient states that she is visiting with friends. The patient required 2 total assist for bed mobility. Patient has been in SNF, followed by Hospice. No further acute PT needs. Patient will require lift OOB, patient's buttocks red and high risk for breakdown if sitting. PT will sign off.    Follow Up Recommendations SNF(return to SNF, followed by Hospice)    Equipment Recommendations  None recommended by PT    Recommendations for Other Services       Precautions / Restrictions Precautions Precautions: Fall Precaution Comments: at risk for skin breakdown      Mobility  Bed Mobility Overal bed mobility: Needs Assistance Bed Mobility: Rolling Rolling: Max assist;+2 for physical assistance         General bed mobility comments: Pt did not initiate any movement; Held rail once rolled onto her side  Transfers                 General transfer comment: Pt declined; will need Maximove to mobilize  OOB  Ambulation/Gait                Stairs            Wheelchair Mobility    Modified Rankin (Stroke Patients Only)       Balance                                             Pertinent Vitals/Pain Pain Assessment: Faces Faces Pain Scale: Hurts little more Pain Location: with rolling and movemetn of legs Pain Descriptors / Indicators: Grimacing;Guarding Pain Intervention(s): Monitored during session    Home Living Family/patient expects to be discharged to:: Skilled nursing facility                      Prior Function           Comments: Household and short distanced community ambulator with RW early Jan 2021     Hand Dominance   Dominant Hand: Right    Extremity/Trunk Assessment   Upper Extremity Assessment Upper Extremity Assessment: Generalized weakness(B shoulder ROM insufficiency)    Lower Extremity Assessment Lower Extremity Assessment: Generalized weakness(does not flex legs without much effort to do so by therapist)    Cervical / Trunk Assessment Cervical / Trunk Assessment: Kyphotic  Communication   Communication: No difficulties  Cognition Arousal/Alertness: Awake/alert Behavior During Therapy: Flat affect Overall Cognitive Status: Impaired/Different from baseline Area of Impairment: Orientation;Attention;Memory;Following commands;Safety/judgement;Awareness;Problem solving  Orientation Level: Disoriented to;Place;Time;Situation Current Attention Level: Sustained Memory: Decreased short-term memory Following Commands: Follows one step commands inconsistently Safety/Judgement: Decreased awareness of safety;Decreased awareness of deficits Awareness: Intellectual Problem Solving: Slow processing;Decreased initiation;Difficulty sequencing;Requires verbal cues;Requires tactile cues General Comments: Pt apparently hallucinating as she has a visitor in the room with her today       General Comments      Exercises     Assessment/Plan    PT Assessment All further PT needs can be met in the next venue of care  PT Problem List Decreased strength;Decreased activity tolerance;Decreased balance;Decreased mobility       PT Treatment Interventions      PT Goals (Current goals can be found in the Care Plan section)  Acute Rehab PT Goals Patient Stated Goal: to visit with her company PT Goal Formulation: All assessment and education complete, DC therapy(returnt to [place]    Frequency     Barriers to discharge        Co-evaluation PT/OT/SLP Co-Evaluation/Treatment: Yes Reason for Co-Treatment: For patient/therapist safety PT goals addressed during session: Mobility/safety with mobility OT goals addressed during session: ADL's and self-care       AM-PAC PT "6 Clicks" Mobility  Outcome Measure Help needed turning from your back to your side while in a flat bed without using bedrails?: Total Help needed moving from lying on your back to sitting on the side of a flat bed without using bedrails?: Total Help needed moving to and from a bed to a chair (including a wheelchair)?: Total Help needed standing up from a chair using your arms (e.g., wheelchair or bedside chair)?: Total Help needed to walk in hospital room?: Total Help needed climbing 3-5 steps with a railing? : Total 6 Click Score: 6    End of Session Equipment Utilized During Treatment: Oxygen Activity Tolerance: Patient limited by fatigue;Patient limited by pain Patient left: in bed;with call bell/phone within reach Nurse Communication: Mobility status;Need for lift equipment PT Visit Diagnosis: Other abnormalities of gait and mobility (R26.89);Muscle weakness (generalized) (M62.81);Unsteadiness on feet (R26.81)    Time: 1103-1130 PT Time Calculation (min) (ACUTE ONLY): 27 min   Charges:   PT Evaluation $PT Eval Moderate Complexity: [place] Pager [phone] Office [phone]   Claretha Cooper 04/06/2019, 1:59 PM

## 2019-04-06 NOTE — Plan of Care (Signed)

## 2019-04-06 NOTE — Progress Notes (Signed)
Occupational Therapy Evaluation Patient Details Name: [name] MRN: [id number] DOB: Jul 15, 1928 Today's Date: 04/06/2019    History of Present Illness [name] is a 86 y.o. female with medical history significant of cecal adenocarcinoma, hypertension, urothelial carcinoma, breast cancer, COPD, prediabetes who presented to the ER with abdominal pain.  Patient was recently admitted here with a partial small bowel obstruction from 03/08/2019-03/12/2019.  She was discharged after tolerating oral feeds and having multiple bowel movements.  She states she has had recurrent symptoms in the last 24 hours with increased abdominal pain, distention, nausea, reduced appetite.  Unable to tolerate anything p.o. in the last 24 hours.  Feels really weak and dehydrated.  Contacted her PCP who recommended coming back to the ER.  Lives alone and is usually independent at baseline.  Currently having difficulty caring for herself because of weakness.   Clinical Impression   Pt appeared to be hallucinating during session as she had a visitor by the name of "Rod Holler" beside her. Worked with pt at bed level and left in chair position in bed as pt will will use of Maximove to mobilize to chair. Max A +2 for rolling in bed. Unable to engage pt in any ADL this session/total A for all self care. Will need assistance with self-feeding. Pt to DC to SNF with hospice care. OT signing off at this time.     Follow Up Recommendations  SNF;Other (comment)(with Hospice Services)    Equipment Recommendations  None recommended by OT    Recommendations for Other Services       Precautions / Restrictions Precautions Precautions: Fall Precaution Comments: at risk for skin breakdown      Mobility Bed Mobility Overal bed mobility: Needs Assistance Bed Mobility: Rolling Rolling: Max assist;+2 for physical assistance         General bed mobility comments: Pt did not initiate any movement; Held rail once rolled  onto her side  Transfers                 General transfer comment: Pt declined; will need Maximove to mobilize OOB    Balance                                           ADL either performed or assessed with clinical judgement   ADL Overall ADL's : Needs assistance/impaired                                       General ADL Comments: total A wtih ADL at this time; unableto engage pt in grooming or feeding self; pt enjoyed having her hair combed and fixed     Vision Baseline Vision/History: Wears glasses       Perception     Praxis      Pertinent Vitals/Pain Pain Assessment: Faces Faces Pain Scale: Hurts little more Pain Location: with rolling and movemetn of legs Pain Descriptors / Indicators: Grimacing;Guarding Pain Intervention(s): Limited activity within patient's tolerance     Hand Dominance Right   Extremity/Trunk Assessment Upper Extremity Assessment Upper Extremity Assessment: Generalized weakness(B shoulder ROM insufficiency)   Lower Extremity Assessment Lower Extremity Assessment: Defer to PT evaluation       Communication Communication Communication: No difficulties   Cognition Arousal/Alertness: Awake/alert Behavior During Therapy: Flat affect  Overall Cognitive Status: Impaired/Different from baseline Area of Impairment: Orientation;Attention;Memory;Following commands;Safety/judgement;Awareness;Problem solving                 Orientation Level: Disoriented to;Place;Time;Situation Current Attention Level: Sustained Memory: Decreased short-term memory Following Commands: Follows one step commands inconsistently Safety/Judgement: Decreased awareness of safety;Decreased awareness of deficits Awareness: Intellectual Problem Solving: Slow processing;Decreased initiation;Difficulty sequencing;Requires verbal cues;Requires tactile cues General Comments: Pt apparently hallucinating as she has a visitor in the  room with her today   General Comments       Exercises     Shoulder Instructions      Home Living Family/patient expects to be discharged to:: Skilled nursing facility                                        Prior Functioning/Environment          Comments: Household and short distanced community ambulator with RW early Jan 2021        OT Problem List: Decreased strength;Decreased range of motion;Decreased activity tolerance;Impaired balance (sitting and/or standing);Decreased coordination;Decreased cognition;Decreased safety awareness;Decreased knowledge of use of DME or AE;Cardiopulmonary status limiting activity;Pain      OT Treatment/Interventions:      OT Goals(Current goals can be found in the care plan section) Acute Rehab OT Goals Patient Stated Goal: to visit with her company OT Goal Formulation: Patient unable to participate in goal setting  OT Frequency:     Barriers to D/C:            Co-evaluation PT/OT/SLP Co-Evaluation/Treatment: Yes Reason for Co-Treatment: Complexity of the patient's impairments (multi-system involvement);For patient/therapist safety   OT goals addressed during session: ADL's and self-care;Strengthening/ROM      AM-PAC OT "6 Clicks" Daily Activity     Outcome Measure Help from another person eating meals?: Total Help from another person taking care of personal grooming?: Total Help from another person toileting, which includes using toliet, bedpan, or urinal?: Total Help from another person bathing (including washing, rinsing, drying)?: Total Help from another person to put on and taking off regular upper body clothing?: Total Help from another person to put on and taking off regular lower body clothing?: Total 6 Click Score: 6   End of Session Nurse Communication: Mobility status  Activity Tolerance: Patient limited by fatigue Patient left: in bed;with bed alarm set;Other (comment)(Left in chair  position)  OT Visit Diagnosis: Other abnormalities of gait and mobility (R26.89);Muscle weakness (generalized) (M62.81);Other symptoms and signs involving cognitive function;Pain Pain - part of body: (generalized)                Time: 1103-1130 OT Time Calculation (min): 27 min Charges:  OT General Charges $OT Visit: 1 Visit OT Evaluation $OT Eval Moderate Complexity: [place], OT/L   Acute OT Clinical Specialist [place] Pager [phone] Office [phone]   [facility] 04/06/2019, 1:46 PM

## 2019-04-07 LAB — GLUCOSE, CAPILLARY
Glucose-Capillary: 113 mg/dL — ABNORMAL HIGH (ref 70–99)
Glucose-Capillary: 127 mg/dL — ABNORMAL HIGH (ref 70–99)
Glucose-Capillary: 77 mg/dL (ref 70–99)
Glucose-Capillary: 79 mg/dL (ref 70–99)
Glucose-Capillary: 91 mg/dL (ref 70–99)
Glucose-Capillary: 95 mg/dL (ref 70–99)

## 2019-04-07 MED ORDER — SODIUM CHLORIDE 0.9% FLUSH
10.0000 mL | Freq: Two times a day (BID) | INTRAVENOUS | Status: DC
  Administered 2019-04-08: 20 mL
  Administered 2019-04-08 – 2019-04-11 (×6): 10 mL

## 2019-04-07 MED ORDER — MORPHINE SULFATE (PF) 2 MG/ML IV SOLN: 4.0000 mg | INTRAVENOUS | Status: DC | PRN

## 2019-04-07 MED ORDER — SODIUM CHLORIDE 0.9% FLUSH: 10.0000 mL | INTRAVENOUS | Status: DC | PRN

## 2019-04-07 MED ORDER — SODIUM CHLORIDE 0.9 % IV BOLUS
250.0000 mL | Freq: Once | INTRAVENOUS | Status: AC
  Administered 2019-04-07: 250 mL via INTRAVENOUS

## 2019-04-07 MED ORDER — MORPHINE SULFATE (PF) 2 MG/ML IV SOLN
2.0000 mg | INTRAVENOUS | Status: DC | PRN
  Administered 2019-04-07: 2 mg via INTRAVENOUS
  Filled 2019-04-07: qty 1

## 2019-04-07 NOTE — Progress Notes (Signed)
Report received from [place], [place] . Assumed care of patient at this time. Agree w/ previous RN assessment. Call light in reach. Bed alarm in use. Continuing to monitor.

## 2019-04-07 NOTE — Progress Notes (Signed)
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  Recent history of exploratory laparotomy on 03/27/2019 that showed metastatic adenocarcinoma with colon being likely primary.  She was made DNR after discussion with palliative care medicine during the last hospitalization.  She was discharged to SNF on 04/03/2019 with plan to follow-up as outpatient for remdesivir infusions.  When she arrived for her infusion on 1/20, she was tachypneic with increasing oxygen requirements and therefore sent to [facility], ER.  Since she has been on 10 L oxygen via [facility].  There was also some concern about lower GI bleed in setting of her cancer diagnosis.   Reason for Visit: Acute respiratory failure with hypoxia.  Pneumonia due to COVID-19.  Consultants: Palliative medicine  Procedures: None  Antibiotics: Anti-infectives (From admission, onward)   Start     Dose/Rate Route Frequency Ordered Stop   04/05/19 1045  remdesivir 100 mg in sodium chloride 0.9 % 100 mL IVPB  Status:  Discontinued     100 mg 200 mL/hr over 30 Minutes Intravenous  Once 04/05/19 1031 04/05/19 1038   04/05/19 1045  remdesivir 100 mg in sodium chloride 0.9 % 100 mL IVPB  Status:  Discontinued     100 mg 200 mL/hr over 30 Minutes Intravenous  Once 04/05/19 1031 04/05/19 1040   May 04, 2019 2130  remdesivir 100 mg in sodium chloride 0.9 % 100 mL IVPB     100 mg 200 mL/hr over 30 Minutes Intravenous Daily May 04, 2019 2015 04/06/19 0918   May 04, 2019 2100  remdesivir 200 mg in sodium  chloride 0.9% 250 mL IVPB  Status:  Discontinued     200 mg 580 mL/hr over 30 Minutes Intravenous Once 2019/05/04 2015 04/07/19 0434      Subjective/Interval History: Overnight events noted.  Patient apparently pulled out her PICC line.  She was a little confused.  Seems to be confused and distracted this morning.  Unable to answer any questions.  Some abdominal pain.      Assessment/Plan:  Acute Hypoxic Resp. Failure/Pneumonia due to COVID-19  Recent Labs  Lab 04/02/19 1215 04/03/19 0524 May 04, 2019 1420 2019-05-04 2013 2019-05-04 2017 04/05/19 0144 04/06/19 0117  DDIMER 9.73* 9.54*  --   --  12.09* 8.93* 11.35*  FERRITIN 918* 798*  --  630*  --  731* 676*  CRP 20.5* 19.0*  --  12.5*  --  13.5* 17.7*  ALT 18 17 19   --   --  19 20  PROCALCITON  --   --   --   --  2.84  --   --     Objective findings: Fever: Afebrile Oxygen requirements: High flow nasal cannula 5 L/min.  Saturating in the late 90s.    COVID 19 Therapeutics: Antibacterials: None Remdesivir: Completed course of remdesivir 1/22 Steroids: Dexamethasone 6 mg daily Diuretics: Not on diuretics Actemra: Not given Convalescent Plasma: Not given PUD Prophylaxis: On Protonix DVT Prophylaxis: Subcutaneous heparin  Patient's respiratory status seems to be stable.  She is still requiring oxygen saturating in the 90s.  D-dimer remains elevated.  She is completed course of remdesivir.  She remains  on steroids.  She is a poor candidate for Actemra and convalescent plasma elevated D-dimer is concerning the setting of metastatic cancer.  However her overall prognosis is poor.  Cannot do CT angiogram due to renal failure.  Could consider doing lower extremity venous Doppler studies.  Leukocytosis is due to steroids.  Continue with incentive spirometry, mobilization.  Acute on chronic kidney disease stage IV/hyperkalemia/metabolic acidosis Baseline creatinine around 1.5-1.8.  Creatinine was rising as of yesterday.  She has made  some urine.  Creatinine is pending this morning.  Unable to draw blood apparently due to poor venous access.  Waiting for PICC line to be replaced.  She was also started on bicarbonate infusion yesterday.    Metastatic adenocarcinoma Patient with a history of colon cancer.  Presented with recurrent small bowel obstruction.  She underwent exploratory laparotomy and was found to have peritoneal implants.  Biopsy was done.  Pathology confirms metastatic cancer.  Previous discussions with palliative medicine have been held.  Patient did not want any treatment for her cancer.  Patient apparently was under hospice services prior to this admission.  Prognosis remains guarded to poor.   History of recurrent small bowel obstruction See above.  Normocytic anemia Hemoglobin stable.  Some concern for GI bleed due to her cancer but no overt bleeding has been noted.  Goals of care Managed to get a hold of her nephew Margaretmary Bayley [phone], [phone]).  Apparently patient does have a sister but she is estranged.  He is the other closest relative that she has had close contact with.  Patient does not have any children or spouse.  He knows that the patient has been declining over the last few months.  He was told about her worsening clinical status.  Plan is to continue current care for 1 to 2 days.  And then depending on her progress will determine further course of action.  DVT Prophylaxis: Subcutaneous heparin Code Status: DNR Family Communication: Discussed with the nephew yesterday.  Will do so again today. Disposition Plan: From skilled nursing facility.  Prognosis is poor.   Medications:  Scheduled: . Chlorhexidine Gluconate Cloth  6 each Topical Daily  . dexamethasone (DECADRON) injection  6 mg Intravenous Q24H  . heparin injection (subcutaneous)  5,000 Units Subcutaneous Q8H  . insulin aspart  0-9 Units Subcutaneous Q4H  . mouth rinse  15 mL Mouth Rinse BID  . nortriptyline  10-20 mg Oral  QHS  . pantoprazole (PROTONIX) IV  40 mg Intravenous QHS   Continuous: .  sodium bicarbonate  infusion 1000 mL 100 mL/hr at 04/06/19 1408   GNF:AOZHYQMVHQION **OR** acetaminophen, albuterol, guaiFENesin-dextromethorphan, loratadine, ondansetron **OR** ondansetron (ZOFRAN) IV, traMADol   Objective:  Vital Signs  Vitals:   04/07/19 0720 04/07/19 0721 04/07/19 1200 04/07/19 1219  BP:  139/80 121/74   Pulse:   (!) 111   Resp: (!) 24   20  Temp:  (!) 97 F (36.1 C)    TempSrc:  Axillary    SpO2:  97%  99%    Intake/Output Summary (Last 24 hours) at 04/07/2019 1319 Last data filed at 04/07/2019 1200 Gross per 24 hour  Intake --  Output 2 ml  Net -2 ml   There were no vitals filed for this visit.  General appearance: Somewhat lethargic but easily arousable.  In no distress.  She appears to be distracted and confused. Resp: Tachypneic at rest.  Crackles bilateral bases.  No wheezing or rhonchi. Cardio: S1-S2 is mildly tachycardic but  regular.  No S3-S4.  No rubs murmurs or bruit GI: Abdomen is soft.  Noted to be mildly distended.  Bowel sounds present.  No masses organomegaly.  Tender in the lower abdomen.   Extremities: Lower extremity edema present Neurologic: Confused and distracted today.  No obvious focal deficits.    Lab Results:  Data Reviewed: I have personally reviewed following labs and imaging studies  CBC: Recent Labs  Lab 04/02/19 1215 04/02/19 1215 04/03/19 0524 04/14/2019 1420 04/05/19 0013 04/05/19 0144 04/06/19 0117  WBC 8.6   < > 8.7 11.1* 16.5* 15.5* 15.9*  NEUTROABS 7.8*  --  8.0*  --   --  14.8* 15.0*  HGB 12.1   < > 11.5* 11.6* 10.7* 10.3* 10.5*  HCT 38.0   < > 36.6 37.1 32.6* 32.7* 32.2*  MCV 94.1   < > 93.8 93.0 91.3 92.6 89.9  PLT 178   < > 188 288 244 234 267   < > = values in this interval not displayed.    Basic Metabolic Panel: Recent Labs  Lab 04/02/19 1215 04/03/19 0524 03/19/2019 1420 04/05/19 0144 04/06/19 0117  NA 142 142 144  144 144  K 3.1* 3.1* 3.7 3.4* 3.1*  CL 119* 116* 116* 116* 116*  CO2 17* 17* 16* 14* 14*  GLUCOSE 104* 136* 103* 99 99  BUN 37* 39* 50* 54* 55*  CREATININE 2.03* 2.12* 2.65* 2.76* 2.94*  CALCIUM 7.7* 7.8* 8.4* 8.1* 8.1*  MG 1.5* 2.0  --  2.1 2.0  PHOS 4.1 5.0*  --  5.6* 5.3*    GFR: Estimated Creatinine Clearance: 11.4 mL/min (A) (by C-G formula based on SCr of 2.94 mg/dL (H)).  Liver Function Tests: Recent Labs  Lab 04/02/19 1215 04/03/19 0524 04/08/2019 1420 04/05/19 0144 04/06/19 0117  AST 20 19 25 23 27   ALT 18 17 19 19 20   ALKPHOS 80 74 106 78 93  BILITOT 0.6 0.4 0.6 0.5 0.4  PROT 4.3* 4.2* 4.4* 4.5* 4.6*  ALBUMIN 1.4* 1.3* 1.5* 1.8* 1.8*    CBG: Recent Labs  Lab 04/06/19 2348 04/07/19 0547 04/07/19 0743 04/07/19 1030 04/07/19 1208  GLUCAP 127* 79 77 95 91    Thyroid Function Tests: Recent Labs    04/05/19 0351  TSH 1.684    Anemia Panel: Recent Labs    04/05/19 0144 04/06/19 0117  FERRITIN 731* 676*    Recent Results (from the past 240 hour(s))  SARS CORONAVIRUS 2 (TAT 6-24 HRS) Nasopharyngeal Nasopharyngeal Swab     Status: Abnormal   Collection Time: 04/01/19  1:10 PM   Specimen: Nasopharyngeal Swab  Result Value Ref Range Status   SARS Coronavirus 2 POSITIVE (A) NEGATIVE Final    Comment: RESULT CALLED TO, READ BACK BY AND VERIFIED WITH: R. BONDURANT,RN 0301 04/02/2019 T. TYSOR (NOTE) SARS-CoV-2 target nucleic acids are DETECTED. The SARS-CoV-2 RNA is generally detectable in upper and lower respiratory specimens during the acute phase of infection. Positive results are indicative of the presence of SARS-CoV-2 RNA. Clinical correlation with patient history and other diagnostic information is  necessary to determine patient infection status. Positive results do not rule out bacterial infection or co-infection with other viruses.  The expected result is Negative. Fact Sheet for Patients: HairSlick.no Fact  Sheet for Healthcare Providers: quierodirigir.com This test is not yet approved or cleared by the [place] FDA and  has been authorized for detection and/or diagnosis of SARS-CoV-2 by FDA under an Emergency Use Authorization (EUA). This EUA will remain  in effect (meaning this test can be used) fo r the duration of the COVID-19 declaration under Section 564(b)(1) of the Act, 21 U.S.C. section 360bbb-3(b)(1), unless the authorization is terminated or revoked sooner. Performed at [facility], [address] [place], [place] [address]   Respiratory Panel by RT PCR (Flu A&B, Covid) - Nasopharyngeal Swab     Status: Abnormal   Collection Time: 04/02/19 11:04 AM   Specimen: Nasopharyngeal Swab  Result Value Ref Range Status   SARS Coronavirus 2 by RT PCR POSITIVE (A) NEGATIVE Final    Comment: RESULT CALLED TO, READ BACK BY AND VERIFIED WITH: WRIGHT E. AT 1243 ON [id number] BY THOMPSON S.    Influenza A by PCR NEGATIVE NEGATIVE Final   Influenza B by PCR NEGATIVE NEGATIVE Final    Comment: (NOTE) The Xpert Xpress SARS-CoV-2/FLU/RSV assay is intended as an aid in  the diagnosis of influenza from Nasopharyngeal swab specimens and  should not be used as a sole basis for treatment. Nasal washings and  aspirates are unacceptable for Xpert Xpress SARS-CoV-2/FLU/RSV  testing. Fact Sheet for Patients: https://www.moore.com/ Fact Sheet for Healthcare Providers: https://www.young.biz/ This test is not yet approved or cleared by the [place] FDA and  has been authorized for detection and/or diagnosis of SARS-CoV-2 by  FDA under an Emergency Use Authorization (EUA). This EUA will remain  in effect (meaning this test can be used) for the duration of the  Covid-19 declaration under Section 564(b)(1) of the Act, 21  U.S.C. section 360bbb-3(b)(1), unless the authorization is  terminated or revoked. Performed at [facility], [address] [place], [place] [address]       Radiology Studies: [place] EKG SITE RITE  Result Date: 04/06/2019 If [facility] image not attached, placement could not be confirmed due to current cardiac rhythm.      LOS: 3 days   Muhsin Doris [employer] on [email or web address]  04/07/2019, 1:19 PM

## 2019-04-07 NOTE — Progress Notes (Signed)
Peripherally Inserted Central Catheter/Midline Placement  The IV Nurse has discussed with the patient and/or persons authorized to consent for the patient, the purpose of this procedure and the potential benefits and risks involved with this procedure.  The benefits include less needle sticks, lab draws from the catheter, and the patient may be discharged home with the catheter. Risks include, but not limited to, infection, bleeding, blood clot (thrombus formation), and puncture of an artery; nerve damage and irregular heartbeat and possibility to perform a PICC exchange if needed/ordered by physician.  Alternatives to this procedure were also discussed.  Bard Power PICC patient education guide, fact sheet on infection prevention and patient information card has been provided to patient /or left at bedside.  Telephone consent obtained from nephew.  PICC/Midline Placement Documentation  PICC Single Lumen 04/07/19 PICC Left Brachial 40 cm 0 cm (Active)  Indication for Insertion or Continuance of Line Administration of hyperosmolar/irritating solutions (i.e. TPN, Vancomycin, etc.) 04/07/19 1625  Exposed Catheter (cm) 0 cm 04/07/19 1625  Site Assessment Clean;Dry;Intact 04/07/19 1625  Line Status Flushed;Saline locked;Blood return noted 04/07/19 1625  Dressing Type Transparent 04/07/19 1625  Dressing Status Clean;Dry;Intact;Antimicrobial disc in place 04/07/19 [place] checked and tightened 04/07/19 1625  Line Adjustment (NICU/IV Team Only) No 04/07/19 1625  Dressing Intervention New dressing 04/07/19 1625  Dressing Change Due 04/14/19 04/07/19 1625       [name] 04/07/2019, 4:26 PM

## 2019-04-08 ENCOUNTER — Encounter (HOSPITAL_COMMUNITY): Payer: PPO

## 2019-04-08 LAB — COMPREHENSIVE METABOLIC PANEL
ALT: 20 U/L (ref 0–44)
AST: 17 U/L (ref 15–41)
Albumin: 1.4 g/dL — ABNORMAL LOW (ref 3.5–5.0)
Alkaline Phosphatase: 83 U/L (ref 38–126)
Anion gap: 12 (ref 5–15)
BUN: 64 mg/dL — ABNORMAL HIGH (ref 8–23)
CO2: 16 mmol/L — ABNORMAL LOW (ref 22–32)
Calcium: 7.7 mg/dL — ABNORMAL LOW (ref 8.9–10.3)
Chloride: 115 mmol/L — ABNORMAL HIGH (ref 98–111)
Creatinine, Ser: 3.7 mg/dL — ABNORMAL HIGH (ref 0.44–1.00)
GFR calc Af Amer: 12 mL/min — ABNORMAL LOW (ref >60)
GFR calc non Af Amer: 10 mL/min — ABNORMAL LOW (ref >60)
Glucose, Bld: 129 mg/dL — ABNORMAL HIGH (ref 70–99)
Potassium: 3.7 mmol/L (ref 3.5–5.1)
Sodium: 143 mmol/L (ref 135–145)
Total Bilirubin: 0.2 mg/dL — ABNORMAL LOW (ref 0.3–1.2)
Total Protein: 4.1 g/dL — ABNORMAL LOW (ref 6.5–8.1)

## 2019-04-08 LAB — CBC
HCT: 31.8 % — ABNORMAL LOW (ref 36.0–46.0)
Hemoglobin: 10.6 g/dL — ABNORMAL LOW (ref 12.0–15.0)
MCH: 29.3 pg (ref 26.0–34.0)
MCHC: 33.3 g/dL (ref 30.0–36.0)
MCV: 87.8 fL (ref 80.0–100.0)
Platelets: 273 10*3/uL (ref 150–400)
RBC: 3.62 MIL/uL — ABNORMAL LOW (ref 3.87–5.11)
RDW: 16.8 % — ABNORMAL HIGH (ref 11.5–15.5)
WBC: 8 10*3/uL (ref 4.0–10.5)
nRBC: 0.6 % — ABNORMAL HIGH (ref 0.0–0.2)

## 2019-04-08 LAB — GLUCOSE, CAPILLARY
Glucose-Capillary: 140 mg/dL — ABNORMAL HIGH (ref 70–99)
Glucose-Capillary: 134 mg/dL — ABNORMAL HIGH (ref 70–99)
Glucose-Capillary: 131 mg/dL — ABNORMAL HIGH (ref 70–99)
Glucose-Capillary: 143 mg/dL — ABNORMAL HIGH (ref 70–99)
Glucose-Capillary: 107 mg/dL — ABNORMAL HIGH (ref 70–99)
Glucose-Capillary: 127 mg/dL — ABNORMAL HIGH (ref 70–99)

## 2019-04-08 LAB — C-REACTIVE PROTEIN: CRP: 25.6 mg/dL — ABNORMAL HIGH (ref <1.0)

## 2019-04-08 LAB — D-DIMER, QUANTITATIVE: D-Dimer, Quant: 11.99 ug/mL-FEU — ABNORMAL HIGH (ref 0.00–0.50)

## 2019-04-08 LAB — FERRITIN: Ferritin: 439 ng/mL — ABNORMAL HIGH (ref 11–307)

## 2019-04-08 MED ORDER — ONDANSETRON HCL 4 MG/2ML IJ SOLN: 4.0000 mg | Freq: Four times a day (QID) | INTRAMUSCULAR | Status: DC | PRN

## 2019-04-08 MED ORDER — HYDROMORPHONE HCL 1 MG/ML IJ SOLN
1.0000 mg | INTRAMUSCULAR | Status: DC | PRN
  Administered 2019-04-08: 1 mg via INTRAVENOUS
  Filled 2019-04-08: qty 2

## 2019-04-08 MED ORDER — HYDROMORPHONE HCL 1 MG/ML IJ SOLN: 0.5000 mg | INTRAMUSCULAR | Status: DC | PRN

## 2019-04-08 MED ORDER — LORAZEPAM 1 MG PO TABS: 1.0000 mg | ORAL_TABLET | ORAL | Status: DC | PRN

## 2019-04-08 MED ORDER — HALOPERIDOL LACTATE 2 MG/ML PO CONC
0.5000 mg | ORAL | Status: DC | PRN
  Filled 2019-04-08: qty 0.3

## 2019-04-08 MED ORDER — GLYCOPYRROLATE 0.2 MG/ML IJ SOLN: 0.2000 mg | INTRAMUSCULAR | Status: DC | PRN

## 2019-04-08 MED ORDER — HALOPERIDOL 0.5 MG PO TABS
0.5000 mg | ORAL_TABLET | ORAL | Status: DC | PRN
  Filled 2019-04-08: qty 1

## 2019-04-08 MED ORDER — HALOPERIDOL LACTATE 5 MG/ML IJ SOLN: 0.5000 mg | INTRAMUSCULAR | Status: DC | PRN

## 2019-04-08 MED ORDER — LORAZEPAM 2 MG/ML IJ SOLN: 1.0000 mg | INTRAMUSCULAR | Status: DC | PRN

## 2019-04-08 MED ORDER — LORAZEPAM 2 MG/ML PO CONC: 1.0000 mg | ORAL | Status: DC | PRN

## 2019-04-08 MED ORDER — ONDANSETRON 4 MG PO TBDP: 4.0000 mg | ORAL_TABLET | Freq: Four times a day (QID) | ORAL | Status: DC | PRN

## 2019-04-08 MED ORDER — BIOTENE DRY MOUTH MT LIQD: 15.0000 mL | OROMUCOSAL | Status: DC | PRN

## 2019-04-08 MED ORDER — GLYCOPYRROLATE 1 MG PO TABS
1.0000 mg | ORAL_TABLET | ORAL | Status: DC | PRN
  Filled 2019-04-08: qty 1

## 2019-04-08 MED ORDER — POLYVINYL ALCOHOL 1.4 % OP SOLN
1.0000 [drp] | Freq: Four times a day (QID) | OPHTHALMIC | Status: DC | PRN
  Filled 2019-04-08: qty 15

## 2019-04-08 NOTE — Progress Notes (Signed)
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  Recent history of exploratory laparotomy on 03/27/2019 that showed metastatic adenocarcinoma with colon being likely primary.  She was made DNR after discussion with palliative care medicine during the last hospitalization.  She was discharged to SNF on 04/03/2019 with plan to follow-up as outpatient for remdesivir infusions.  When she arrived for her infusion on 1/20, she was tachypneic with increasing oxygen requirements and therefore sent to [facility], ER.  Since she has been on 10 L oxygen via HFNC.  There was also some concern about lower GI bleed in setting of her cancer diagnosis.   Reason for Visit: Acute respiratory failure with hypoxia.  Pneumonia due to COVID-19.  Consultants: Palliative medicine  Procedures: None  Antibiotics: Anti-infectives (From admission, onward)   Start     Dose/Rate Route Frequency Ordered Stop   04/05/19 1045  remdesivir 100 mg in sodium chloride 0.9 % 100 mL IVPB  Status:  Discontinued     100 mg 200 mL/hr over 30 Minutes Intravenous  Once 04/05/19 1031 04/05/19 1038   04/05/19 1045  remdesivir 100 mg in sodium chloride 0.9 % 100 mL IVPB  Status:  Discontinued     100 mg 200 mL/hr over 30 Minutes Intravenous  Once 04/05/19 1031 04/05/19 1040   10-Apr-2019 2130  remdesivir 100 mg in sodium chloride 0.9 % 100 mL IVPB     100 mg 200 mL/hr over 30 Minutes Intravenous Daily Apr 10, 2019 2015 04/06/19 0918   April 10, 2019 2100  remdesivir 200 mg in sodium  chloride 0.9% 250 mL IVPB  Status:  Discontinued     200 mg 580 mL/hr over 30 Minutes Intravenous Once 04/10/2019 2015 04/07/19 0434      Subjective/Interval History: Patient remains poorly responsive today.  Incoherent.  Noted to have abdominal pain on palpation.    Assessment/Plan:  Acute Hypoxic Resp. Failure/Pneumonia due to COVID-19 From a COVID-19 and respiratory standpoint patient remains tenuous.  She is requiring high flow nasal cannula up to 15 L/min.  Saturating in the early 90s.  She has completed course of remdesivir.  She was continued on steroids.  However patient continues to decline.  Her renal function is worse today.  She recently diagnosed with metastatic adenocarcinoma with peritoneal implants.  Overall her prognosis is very poor.  And patient also noted to be declining now.  She was under hospice care at a skilled nursing facility.  Discussed with her nephew today.  Transition to comfort care.  Acute on chronic kidney disease stage IV/hyperkalemia/metabolic acidosis Baseline creatinine around 1.5-1.8.  Creatinine continues to worsen.  This is most likely a terminal condition for her.  Comfort care initiated.  Metastatic adenocarcinoma Patient with a history of colon cancer.  Presented with recurrent small bowel obstruction.  She underwent exploratory laparotomy and was found to have peritoneal implants.  Biopsy was done.  Pathology confirms metastatic cancer.  Previous discussions with palliative medicine have been held.  Patient did not want any treatment for her cancer.  Patient apparently was under hospice services prior to this admission.   History of recurrent  small bowel obstruction See above.  Normocytic anemia Hemoglobin stable.  Some concern for GI bleed due to her cancer but no overt bleeding has been noted.  Goals of care She apparently has a nephew Margaretmary Bayley [phone], [phone]).  Apparently patient does have a sister but she is estranged.  He is  the other closest relative that she has had close contact with.  Patient does not have any children or spouse.  See discussion above.  Transition to comfort care.  DVT Prophylaxis: Comfort care Code Status: DNR Family Communication: Discussed with the nephew. Disposition Plan: Transition to comfort care.  Depending on how she does over the next 24 to 48 hours she may be a candidate for residential hospice if she stabilizes.   Medications:  Scheduled: . Chlorhexidine Gluconate Cloth  6 each Topical Daily  . mouth rinse  15 mL Mouth Rinse BID  . sodium chloride flush  10-40 mL Intracatheter Q12H   Continuous:  GNF:[id number] **OR** acetaminophen, antiseptic oral rinse, glycopyrrolate **OR** glycopyrrolate **OR** glycopyrrolate, guaiFENesin-dextromethorphan, haloperidol **OR** haloperidol **OR** haloperidol lactate, HYDROmorphone (DILAUDID) injection, LORazepam **OR** LORazepam **OR** LORazepam, ondansetron **OR** ondansetron (ZOFRAN) IV, polyvinyl alcohol, sodium chloride flush   Objective:  Vital Signs  Vitals:   04/08/19 0000 04/08/19 0400 04/08/19 0800 04/08/19 1212  BP:  (!) 88/50 (!) 95/54 99/73  Pulse:  (!) 110  (!) 108  Resp:  (!) 24  (!) 23  Temp: 98.1 F (36.7 C) 97.6 F (36.4 C) 98.4 F (36.9 C) 97.9 F (36.6 C)  TempSrc: Axillary Axillary Axillary Axillary  SpO2:  100%  93%    Intake/Output Summary (Last 24 hours) at 04/08/2019 1221 Last data filed at 04/08/2019 0500 Gross per 24 hour  Intake 790.61 ml  Output 650 ml  Net 140.61 ml   There were no vitals filed for this visit.  General appearance: Not responsive.  Noted to be in discomfort Resp: Crackles bilaterally.  Tachypneic. Cardio: S1-S2 is normal regular.  No S3-S4.  No rubs murmurs or bruit GI: Abdomen is slightly distended.  Dressing noted over the midline.  Staples are present.  Very tender to palpation.  No masses organomegaly.   Neurologic: Not very responsive this morning.  Lab  Results:  Data Reviewed: I have personally reviewed following labs and imaging studies  CBC: Recent Labs  Lab 04/02/19 1215 04/02/19 1215 04/03/19 0524 04/03/19 0524 Apr 14, 2019 1420 04/05/19 0013 04/05/19 0144 04/06/19 0117 04/08/19 0927  WBC 8.6   < > 8.7   < > 11.1* 16.5* 15.5* 15.9* 8.0  NEUTROABS 7.8*  --  8.0*  --   --   --  14.8* 15.0*  --   HGB 12.1   < > 11.5*   < > 11.6* 10.7* 10.3* 10.5* 10.6*  HCT 38.0   < > 36.6   < > 37.1 32.6* 32.7* 32.2* 31.8*  MCV 94.1   < > 93.8   < > 93.0 91.3 92.6 89.9 87.8  PLT 178   < > 188   < > 288 244 234 267 273   < > = values in this interval not displayed.    Basic Metabolic Panel: Recent Labs  Lab 04/02/19 1215 04/02/19 1215 04/03/19 0524 Apr 14, 2019 1420 04/05/19 0144 04/06/19 0117 04/08/19 0927  NA 142   < > 142 144 144 144 143  K 3.1*   < > 3.1* 3.7 3.4* 3.1* 3.7  CL 119*   < > 116* 116* 116* 116* 115*  CO2 17*   < >  17* 16* 14* 14* 16*  GLUCOSE 104*   < > 136* 103* 99 99 129*  BUN 37*   < > 39* 50* 54* 55* 64*  CREATININE 2.03*   < > 2.12* 2.65* 2.76* 2.94* 3.70*  CALCIUM 7.7*   < > 7.8* 8.4* 8.1* 8.1* 7.7*  MG 1.5*  --  2.0  --  2.1 2.0  --   PHOS 4.1  --  5.0*  --  5.6* 5.3*  --    < > = values in this interval not displayed.    GFR: Estimated Creatinine Clearance: 9.1 mL/min (A) (by C-G formula based on SCr of 3.7 mg/dL (H)).  Liver Function Tests: Recent Labs  Lab 04/03/19 0524 04/11/2019 1420 04/05/19 0144 04/06/19 0117 04/08/19 0927  AST 19 25 23 27 17   ALT 17 19 19 20 20   ALKPHOS 74 106 78 93 83  BILITOT 0.4 0.6 0.5 0.4 0.2*  PROT 4.2* 4.4* 4.5* 4.6* 4.1*  ALBUMIN 1.3* 1.5* 1.8* 1.8* 1.4*    CBG: Recent Labs  Lab 04/07/19 1714 04/08/19 0213 04/08/19 0431 04/08/19 0708 04/08/19 1133  GLUCAP 113* 134* 131* 143* 107*    Anemia Panel: Recent Labs    04/06/19 0117 04/08/19 0928  FERRITIN 676* 439*    Recent Results (from the past 240 hour(s))  SARS CORONAVIRUS 2 (TAT 6-24 HRS)  Nasopharyngeal Nasopharyngeal Swab     Status: Abnormal   Collection Time: 04/01/19  1:10 PM   Specimen: Nasopharyngeal Swab  Result Value Ref Range Status   SARS Coronavirus 2 POSITIVE (A) NEGATIVE Final    Comment: RESULT CALLED TO, READ BACK BY AND VERIFIED WITH: R. BONDURANT,RN 0301 04/02/2019 T. TYSOR (NOTE) SARS-CoV-2 target nucleic acids are DETECTED. The SARS-CoV-2 RNA is generally detectable in upper and lower respiratory specimens during the acute phase of infection. Positive results are indicative of the presence of SARS-CoV-2 RNA. Clinical correlation with patient history and other diagnostic information is  necessary to determine patient infection status. Positive results do not rule out bacterial infection or co-infection with other viruses.  The expected result is Negative. Fact Sheet for Patients: HairSlick.no Fact Sheet for Healthcare Providers: quierodirigir.com This test is not yet approved or cleared by the [place] FDA and  has been authorized for detection and/or diagnosis of SARS-CoV-2 by FDA under an Emergency Use Authorization (EUA). This EUA will remain  in effect (meaning this test can be used) fo r the duration of the COVID-19 declaration under Section 564(b)(1) of the Act, 21 U.S.C. section 360bbb-3(b)(1), unless the authorization is terminated or revoked sooner. Performed at [facility], [address] [place], [place] [address]   Respiratory Panel by RT PCR (Flu A&B, Covid) - Nasopharyngeal Swab     Status: Abnormal   Collection Time: 04/02/19 11:04 AM   Specimen: Nasopharyngeal Swab  Result Value Ref Range Status   SARS Coronavirus 2 by RT PCR POSITIVE (A) NEGATIVE Final    Comment: RESULT CALLED TO, READ BACK BY AND VERIFIED WITH: WRIGHT E. AT 1243 ON [id number] BY THOMPSON S.    Influenza A by PCR NEGATIVE NEGATIVE Final   Influenza B by PCR NEGATIVE NEGATIVE Final    Comment:  (NOTE) The Xpert Xpress SARS-CoV-2/FLU/RSV assay is intended as an aid in  the diagnosis of influenza from Nasopharyngeal swab specimens and  should not be used as a sole basis for treatment. Nasal washings and  aspirates are unacceptable for Xpert Xpress SARS-CoV-2/FLU/RSV  testing. Fact Sheet  for Patients: https://www.moore.com/ Fact Sheet for Healthcare Providers: https://www.young.biz/ This test is not yet approved or cleared by the [place] FDA and  has been authorized for detection and/or diagnosis of SARS-CoV-2 by  FDA under an Emergency Use Authorization (EUA). This EUA will remain  in effect (meaning this test can be used) for the duration of the  Covid-19 declaration under Section 564(b)(1) of the Act, 21  U.S.C. section 360bbb-3(b)(1), unless the authorization is  terminated or revoked. Performed at [facility], [address], [place], [place] [address]       Radiology Studies: [place] EKG SITE RITE  Result Date: 04/06/2019 If [facility] image not attached, placement could not be confirmed due to current cardiac rhythm.      LOS: 4 days   [name] [employer] on [email or web address]  04/08/2019, 12:21 PM

## 2019-04-09 DIAGNOSIS — Z515 Encounter for palliative care: Secondary | ICD-10-CM

## 2019-04-09 LAB — GLUCOSE, CAPILLARY
Glucose-Capillary: 105 mg/dL — ABNORMAL HIGH (ref 70–99)
Glucose-Capillary: 54 mg/dL — ABNORMAL LOW (ref 70–99)

## 2019-04-09 MED ORDER — DEXTROSE 50 % IV SOLN
INTRAVENOUS | Status: AC
  Filled 2019-04-09: qty 50

## 2019-04-09 NOTE — Progress Notes (Signed)
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  Recent history of exploratory laparotomy on 03/27/2019 that showed metastatic adenocarcinoma with colon being likely primary.  She was made DNR after discussion with palliative care medicine during the last hospitalization.  She was discharged to SNF on 04/03/2019 with plan to follow-up as outpatient for remdesivir infusions.  When she arrived for her infusion on 1/20, she was tachypneic with increasing oxygen requirements and therefore sent to [facility], ER.  Since she has been on 10 L oxygen via HFNC.  There was also some concern about lower GI bleed in setting of her cancer diagnosis.   Reason for Visit: Acute respiratory failure with hypoxia.  Pneumonia due to COVID-19.  Consultants: None  Procedures: None  Antibiotics: Anti-infectives (From admission, onward)   Start     Dose/Rate Route Frequency Ordered Stop   04/05/19 1045  remdesivir 100 mg in sodium chloride 0.9 % 100 mL IVPB  Status:  Discontinued     100 mg 200 mL/hr over 30 Minutes Intravenous  Once 04/05/19 1031 04/05/19 1038   04/05/19 1045  remdesivir 100 mg in sodium chloride 0.9 % 100 mL IVPB  Status:  Discontinued     100 mg 200 mL/hr over 30 Minutes Intravenous  Once 04/05/19 1031 04/05/19 1040   03/19/2019 2130  remdesivir 100 mg in sodium chloride 0.9 % 100 mL IVPB     100 mg 200 mL/hr over 30 Minutes Intravenous Daily 03/27/2019 2015 04/06/19 0918   03/18/2019 2100  remdesivir 200 mg in sodium chloride 0.9%  250 mL IVPB  Status:  Discontinued     200 mg 580 mL/hr over 30 Minutes Intravenous Once 04/01/2019 2015 04/07/19 0434      Subjective/Interval History: Patient with eyes open but does not really respond.  Noted to be in some discomfort.  Unable to verbalize.    Assessment/Plan:  Acute Hypoxic Resp. Failure/Pneumonia due to COVID-19 From a respiratory standpoint patient remains tenuous.  She is still requiring high flow nasal cannula between 8 to 15 L.  She has completed course of remdesivir.  Due to poor prognosis and a decline in renal function she was transitioned to comfort care.    Acute on chronic kidney disease stage IV/hyperkalemia/metabolic acidosis Baseline creatinine around 1.5-1.8.  Creatinine continues to worsen.  This is most likely a terminal condition for her.  Comfort care was initiated.  Metastatic adenocarcinoma Patient with a history of colon cancer.  Presented with recurrent small bowel obstruction.  She underwent exploratory laparotomy and was found to have peritoneal implants.  Biopsy was done.  Pathology confirms metastatic cancer.  Previous discussions with palliative medicine have been held.  Patient did not want any treatment for her cancer.  Patient apparently was under hospice services prior to this admission.   History of recurrent small bowel obstruction See above.  Normocytic anemia Hemoglobin stable.  Some concern for GI bleed due to her cancer but no overt bleeding has been noted.  Goals of care She apparently has a nephew [name] [phone], [phone]).  Apparently patient does have a sister but she  is estranged.  He is the other closest relative that she has had close contact with.  Patient does not have any children or spouse.  After discussions with the patient's nephew she was transitioned to comfort care.  DVT Prophylaxis: Comfort care Code Status: DNR Family Communication: Discussed with the nephew. Disposition Plan: Prognosis is  poor.  Life expectancy is a few days to 1 to 2 weeks at most.  May need to pursue residential hospice.  Will consult social worker.     Medications:  Scheduled: . Chlorhexidine Gluconate Cloth  6 each Topical Daily  . mouth rinse  15 mL Mouth Rinse BID  . sodium chloride flush  10-40 mL Intracatheter Q12H   Continuous:  ZOX:[id number] **OR** acetaminophen, antiseptic oral rinse, glycopyrrolate **OR** glycopyrrolate **OR** glycopyrrolate, guaiFENesin-dextromethorphan, haloperidol **OR** haloperidol **OR** haloperidol lactate, HYDROmorphone (DILAUDID) injection, LORazepam **OR** LORazepam **OR** LORazepam, ondansetron **OR** ondansetron (ZOFRAN) IV, polyvinyl alcohol, sodium chloride flush   Objective:  Vital Signs  Vitals:   04/08/19 1955 04/09/19 0000 04/09/19 0400 04/09/19 0741  BP: 108/60 109/62 (!) 100/49 (!) 145/60  Pulse: 93 (!) 101 89 (!) 107  Resp: 15 15 16 16   Temp: (!) 97.1 F (36.2 C) (!) 97.4 F (36.3 C) (!) 97.1 F (36.2 C) (!) 97.5 F (36.4 C)  TempSrc: Axillary Axillary Oral Axillary  SpO2: 95% (!) 87% 99% (!) 89%    Intake/Output Summary (Last 24 hours) at 04/09/2019 1043 Last data filed at 04/09/2019 0500 Gross per 24 hour  Intake 0 ml  Output 600 ml  Net -600 ml    General appearance: Not very responsive.  Eyes are open though.  No distress. Resp: Tachypneic.  Crackles bilaterally.  No wheezing or rhonchi. Cardio: S1-S2 is normal regular.  No S3-S4.  No rubs murmurs or bruit GI: Abdomen soft.  Tender.  Incision site covered with dressing.  Bowel sounds present.   Extremities: Edema noted bilateral upper and lower extremities. Neurologic: Disoriented.  No obvious focal deficits.    Lab Results:  Data Reviewed: I have personally reviewed following labs and imaging studies  CBC: Recent Labs  Lab 04/02/19 1215 04/02/19 1215 04/03/19 0524 04/03/19 0524 2019-04-16 1420 04/05/19 0013 04/05/19 0144 04/06/19 0117 04/08/19 0927  WBC 8.6   < > 8.7    < > 11.1* 16.5* 15.5* 15.9* 8.0  NEUTROABS 7.8*  --  8.0*  --   --   --  14.8* 15.0*  --   HGB 12.1   < > 11.5*   < > 11.6* 10.7* 10.3* 10.5* 10.6*  HCT 38.0   < > 36.6   < > 37.1 32.6* 32.7* 32.2* 31.8*  MCV 94.1   < > 93.8   < > 93.0 91.3 92.6 89.9 87.8  PLT 178   < > 188   < > 288 244 234 267 273   < > = values in this interval not displayed.    Basic Metabolic Panel: Recent Labs  Lab 04/02/19 1215 04/02/19 1215 04/03/19 0524 Apr 16, 2019 1420 04/05/19 0144 04/06/19 0117 04/08/19 0927  NA 142   < > 142 144 144 144 143  K 3.1*   < > 3.1* 3.7 3.4* 3.1* 3.7  CL 119*   < > 116* 116* 116* 116* 115*  CO2 17*   < > 17* 16* 14* 14* 16*  GLUCOSE 104*   < > 136* 103* 99 99 129*  BUN 37*   < > 39* 50* 54* 55* 64*  CREATININE 2.03*   < >  2.12* 2.65* 2.76* 2.94* 3.70*  CALCIUM 7.7*   < > 7.8* 8.4* 8.1* 8.1* 7.7*  MG 1.5*  --  2.0  --  2.1 2.0  --   PHOS 4.1  --  5.0*  --  5.6* 5.3*  --    < > = values in this interval not displayed.    GFR: Estimated Creatinine Clearance: 9.1 mL/min (A) (by C-G formula based on SCr of 3.7 mg/dL (H)).  Liver Function Tests: Recent Labs  Lab 04/03/19 0524 04/11/2019 1420 04/05/19 0144 04/06/19 0117 04/08/19 0927  AST 19 25 23 27 17   ALT 17 19 19 20 20   ALKPHOS 74 106 78 93 83  BILITOT 0.4 0.6 0.5 0.4 0.2*  PROT 4.2* 4.4* 4.5* 4.6* 4.1*  ALBUMIN 1.3* 1.5* 1.8* 1.8* 1.4*    CBG: Recent Labs  Lab 04/08/19 0431 04/08/19 0708 04/08/19 1133 04/08/19 1948 04/09/19 0740  GLUCAP 131* 143* 107* 127* 105*    Anemia Panel: Recent Labs    04/08/19 0928  FERRITIN 439*    Recent Results (from the past 240 hour(s))  SARS CORONAVIRUS 2 (TAT 6-24 HRS) Nasopharyngeal Nasopharyngeal Swab     Status: Abnormal   Collection Time: 04/01/19  1:10 PM   Specimen: Nasopharyngeal Swab  Result Value Ref Range Status   SARS Coronavirus 2 POSITIVE (A) NEGATIVE Final    Comment: RESULT CALLED TO, READ BACK BY AND VERIFIED WITH: R. BONDURANT,RN 0301  04/02/2019 T. TYSOR (NOTE) SARS-CoV-2 target nucleic acids are DETECTED. The SARS-CoV-2 RNA is generally detectable in upper and lower respiratory specimens during the acute phase of infection. Positive results are indicative of the presence of SARS-CoV-2 RNA. Clinical correlation with patient history and other diagnostic information is  necessary to determine patient infection status. Positive results do not rule out bacterial infection or co-infection with other viruses.  The expected result is Negative. Fact Sheet for Patients: HairSlick.no Fact Sheet for Healthcare Providers: quierodirigir.com This test is not yet approved or cleared by the [place] FDA and  has been authorized for detection and/or diagnosis of SARS-CoV-2 by FDA under an Emergency Use Authorization (EUA). This EUA will remain  in effect (meaning this test can be used) fo r the duration of the COVID-19 declaration under Section 564(b)(1) of the Act, 21 U.S.C. section 360bbb-3(b)(1), unless the authorization is terminated or revoked sooner. Performed at [facility], [address], [place], [place] [address]   Respiratory Panel by RT PCR (Flu A&B, Covid) - Nasopharyngeal Swab     Status: Abnormal   Collection Time: 04/02/19 11:04 AM   Specimen: Nasopharyngeal Swab  Result Value Ref Range Status   SARS Coronavirus 2 by RT PCR POSITIVE (A) NEGATIVE Final    Comment: RESULT CALLED TO, READ BACK BY AND VERIFIED WITH: WRIGHT E. AT 1243 ON [id number] BY THOMPSON S.    Influenza A by PCR NEGATIVE NEGATIVE Final   Influenza B by PCR NEGATIVE NEGATIVE Final    Comment: (NOTE) The Xpert Xpress SARS-CoV-2/FLU/RSV assay is intended as an aid in  the diagnosis of influenza from Nasopharyngeal swab specimens and  should not be used as a sole basis for treatment. Nasal washings and  aspirates are unacceptable for Xpert Xpress SARS-CoV-2/FLU/RSV  testing. Fact  Sheet for Patients: https://www.moore.com/ Fact Sheet for Healthcare Providers: https://www.young.biz/ This test is not yet approved or cleared by the [place] FDA and  has been authorized for detection and/or diagnosis of SARS-CoV-2 by  FDA under an Emergency  Use Authorization (EUA). This EUA will remain  in effect (meaning this test can be used) for the duration of the  Covid-19 declaration under Section 564(b)(1) of the Act, 21  U.S.C. section 360bbb-3(b)(1), unless the authorization is  terminated or revoked. Performed at [facility], [address] [place], [place] [address]       Radiology Studies: No results found.     LOS: 5 days   Britny Riel [employer] on [email or web address]  04/09/2019, 10:43 AM

## 2019-04-10 DIAGNOSIS — R109 Unspecified abdominal pain: Secondary | ICD-10-CM

## 2019-04-10 NOTE — Progress Notes (Signed)
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  Recent history of exploratory laparotomy on 03/27/2019 that showed metastatic adenocarcinoma with colon being likely primary.  She was made DNR after discussion with palliative care medicine during the last hospitalization.  She was discharged to SNF on 04/03/2019 with plan to follow-up as outpatient for remdesivir infusions.  When she arrived for her infusion on 1/20, she was tachypneic with increasing oxygen requirements and therefore sent to [facility], ER.  Since she has been on 10 L oxygen via [facility]NC.  There was also some concern about lower GI bleed in setting of her cancer diagnosis.   Reason for Visit: Acute respiratory failure with hypoxia.  Pneumonia due to COVID-19.  Consultants: None  Procedures: None  Antibiotics: Anti-infectives (From admission, onward)   Start     Dose/Rate Route Frequency Ordered Stop   04/05/19 1045  remdesivir 100 mg in sodium chloride 0.9 % 100 mL IVPB  Status:  Discontinued     100 mg 200 mL/hr over 30 Minutes Intravenous  Once 04/05/19 1031 04/05/19 1038   04/05/19 1045  remdesivir 100 mg in sodium chloride 0.9 % 100 mL IVPB  Status:  Discontinued     100 mg 200 mL/hr over 30 Minutes Intravenous  Once 04/05/19 1031 04/05/19 1040   Apr 24, 2019 2130  remdesivir 100 mg in sodium chloride 0.9 % 100 mL IVPB     100 mg 200 mL/hr over 30 Minutes Intravenous Daily Apr 24, 2019 2015 04/06/19 0918   04-24-2019 2100  remdesivir 200 mg in sodium chloride 0.9%  250 mL IVPB  Status:  Discontinued     200 mg 580 mL/hr over 30 Minutes Intravenous Once 24-Apr-2019 2015 04/07/19 0434      Subjective/Interval History: Patient unresponsive this morning.    Assessment/Plan:  Acute Hypoxic Resp. Failure/Pneumonia due to COVID-19 Patient completed course of remdesivir.  Patient with metastatic adenocarcinoma and worsening renal function and so thought to have a poor prognosis.  Transitioned to comfort care.    Acute on chronic kidney disease stage IV/hyperkalemia/metabolic acidosis Creatinine continues to worsen.  This is most likely a terminal condition for her.  Comfort care was initiated.  Metastatic adenocarcinoma Patient with a history of colon cancer.  Presented with recurrent small bowel obstruction.  She underwent exploratory laparotomy on 03/27/2019 and was found to have peritoneal implants.  Biopsy was done.  Pathology confirms metastatic cancer.  Previous discussions with palliative medicine have been held.  Patient did not want any treatment for her cancer.  Patient apparently was under hospice services prior to this admission.   History of recurrent small bowel obstruction See above.  Normocytic anemia Hemoglobin stable.  Some concern for GI bleed due to her cancer but no overt bleeding has been noted.  Goals of care She has a nephew Margaretmary Bayley [phone], [phone]).  Apparently patient does have a sister but she is estranged.  He is the other closest relative that she has had close contact with.  Patient does not have any children or spouse.  After discussions with the patient's nephew she was transitioned to comfort care.  DVT Prophylaxis: Comfort care Code Status: DNR Family Communication: Discussed with the nephew. Disposition Plan: Patient has declined over the last 24 hours.  Anticipate in-hospital death now.     Medications:  Scheduled: . Chlorhexidine Gluconate Cloth  6 each Topical Daily  . mouth rinse  15 mL Mouth  Rinse BID  . sodium chloride flush  10-40 mL Intracatheter Q12H   Continuous:  LKG:[id number] **OR** acetaminophen, antiseptic oral rinse, glycopyrrolate **OR** glycopyrrolate **OR** glycopyrrolate, guaiFENesin-dextromethorphan, haloperidol **OR** haloperidol **OR** haloperidol lactate, HYDROmorphone (DILAUDID) injection, LORazepam **OR** LORazepam **OR** LORazepam, ondansetron **OR** ondansetron (ZOFRAN) IV, polyvinyl alcohol, sodium chloride flush   Objective:  Vital Signs  Vitals:   04/09/19 1700 04/09/19 2237 04/10/19 0445 04/10/19 0746  BP: 124/66 106/60 132/88 120/74  Pulse: (!) 107 (!) 111 (!) 110 (!) 112  Resp:  15 20 16   Temp:  (!) 96.9 F (36.1 C) 97.7 F (36.5 C) 97.8 F (36.6 C)  TempSrc:  Axillary Axillary Axillary  SpO2: (!) 86% 97% 92%     Intake/Output Summary (Last 24 hours) at 04/10/2019 1129 Last data filed at 04/10/2019 0347 Gross per 24 hour  Intake --  Output 300 ml  Net -300 ml    General appearance: Unresponsive Resp: Shallow breaths.  Crackles bilaterally. Cardio: S1-S2 is normal regular.  No S3-S4.  No rubs murmurs or bruit GI: Abdomen is tender.  Incision site covered with dressing.    Lab Results:  Data Reviewed: I have personally reviewed following labs and imaging studies  CBC: Recent Labs  Lab 2019-05-03 1420 04/05/19 0013 04/05/19 0144 04/06/19 0117 04/08/19 0927  WBC 11.1* 16.5* 15.5* 15.9* 8.0  NEUTROABS  --   --  14.8* 15.0*  --   HGB 11.6* 10.7* 10.3* 10.5* 10.6*  HCT 37.1 32.6* 32.7* 32.2* 31.8*  MCV 93.0 91.3 92.6 89.9 87.8  PLT 288 244 234 267 273    Basic Metabolic Panel: Recent Labs  Lab 05/03/2019 1420 04/05/19 0144 04/06/19 0117 04/08/19 0927  NA 144 144 144 143  K 3.7 3.4* 3.1* 3.7  CL 116* 116* 116* 115*  CO2 16* 14* 14* 16*  GLUCOSE 103* 99 99 129*  BUN 50* 54* 55* 64*  CREATININE 2.65* 2.76* 2.94* 3.70*  CALCIUM 8.4* 8.1* 8.1* 7.7*  MG  --  2.1 2.0  --   PHOS  --  5.6* 5.3*  --     GFR: CrCl  cannot be calculated (Unknown ideal weight.).  Liver Function Tests: Recent Labs  Lab May 03, 2019 1420 04/05/19 0144 04/06/19 0117 04/08/19 0927  AST 25 23 27 17   ALT 19 19 20 20   ALKPHOS 106 78 93 83  BILITOT 0.6 0.5 0.4 0.2*  PROT 4.4* 4.5* 4.6* 4.1*  ALBUMIN 1.5* 1.8* 1.8* 1.4*    CBG: Recent Labs  Lab 04/08/19 0708 04/08/19 1133 04/08/19 1948 04/09/19 0740 04/09/19 1154  GLUCAP 143* 107* 127* 105* 54*    Anemia Panel: Recent Labs    04/08/19 0928  FERRITIN 439*    Recent Results (from the past 240 hour(s))  SARS CORONAVIRUS 2 (TAT 6-24 HRS) Nasopharyngeal Nasopharyngeal Swab     Status: Abnormal   Collection Time: 04/01/19  1:10 PM   Specimen: Nasopharyngeal Swab  Result Value Ref Range Status   SARS Coronavirus 2 POSITIVE (A) NEGATIVE Final    Comment: RESULT CALLED TO, READ BACK BY AND VERIFIED WITH: R. BONDURANT,RN 0301 04/02/2019 T. TYSOR (NOTE) SARS-CoV-2 target nucleic acids are DETECTED. The SARS-CoV-2 RNA is generally detectable in  upper and lower respiratory specimens during the acute phase of infection. Positive results are indicative of the presence of SARS-CoV-2 RNA. Clinical correlation with patient history and other diagnostic information is  necessary to determine patient infection status. Positive results do not rule out bacterial infection or co-infection with other viruses.  The expected result is Negative. Fact Sheet for Patients: HairSlick.no Fact Sheet for Healthcare Providers: quierodirigir.com This test is not yet approved or cleared by the [place] FDA and  has been authorized for detection and/or diagnosis of SARS-CoV-2 by FDA under an Emergency Use Authorization (EUA). This EUA will remain  in effect (meaning this test can be used) fo r the duration of the COVID-19 declaration under Section 564(b)(1) of the Act, 21 U.S.C. section 360bbb-3(b)(1), unless the authorization is  terminated or revoked sooner. Performed at [facility], [address], [place], [place] [address]   Respiratory Panel by RT PCR (Flu A&B, Covid) - Nasopharyngeal Swab     Status: Abnormal   Collection Time: 04/02/19 11:04 AM   Specimen: Nasopharyngeal Swab  Result Value Ref Range Status   SARS Coronavirus 2 by RT PCR POSITIVE (A) NEGATIVE Final    Comment: RESULT CALLED TO, READ BACK BY AND VERIFIED WITH: WRIGHT E. AT 1243 ON [id number] BY THOMPSON S.    Influenza A by PCR NEGATIVE NEGATIVE Final   Influenza B by PCR NEGATIVE NEGATIVE Final    Comment: (NOTE) The Xpert Xpress SARS-CoV-2/FLU/RSV assay is intended as an aid in  the diagnosis of influenza from Nasopharyngeal swab specimens and  should not be used as a sole basis for treatment. Nasal washings and  aspirates are unacceptable for Xpert Xpress SARS-CoV-2/FLU/RSV  testing. Fact Sheet for Patients: https://www.moore.com/ Fact Sheet for Healthcare Providers: https://www.young.biz/ This test is not yet approved or cleared by the [place] FDA and  has been authorized for detection and/or diagnosis of SARS-CoV-2 by  FDA under an Emergency Use Authorization (EUA). This EUA will remain  in effect (meaning this test can be used) for the duration of the  Covid-19 declaration under Section 564(b)(1) of the Act, 21  U.S.C. section 360bbb-3(b)(1), unless the authorization is  terminated or revoked. Performed at [facility], [address] [place], [place] [address]       Radiology Studies: No results found.     LOS: 6 days   Cyana Shook [employer] on [email or web address]  04/10/2019, 11:29 AM

## 2019-04-10 NOTE — Progress Notes (Signed)
Report obtained from [facility]. Pt and belongings transferred into room 311. Pt awake. Non-verbal,non-interactive. VS obtained. Callbell/personal belongings within reach. Bed alarm ON. No distress observed.

## 2019-04-11 DIAGNOSIS — R0902 Hypoxemia: Secondary | ICD-10-CM

## 2019-04-11 DIAGNOSIS — R7989 Other specified abnormal findings of blood chemistry: Secondary | ICD-10-CM

## 2019-04-11 MED ORDER — FENTANYL 2500MCG IN NS 250ML (10MCG/ML) PREMIX INFUSION
10.0000 ug/h | INTRAVENOUS | Status: DC
  Administered 2019-04-11: 10 ug/h via INTRAVENOUS
  Filled 2019-04-11: qty 250

## 2019-04-11 MED ORDER — MORPHINE 100MG IN NS 100ML (1MG/ML) PREMIX INFUSION
1.0000 mg/h | INTRAVENOUS | Status: DC
  Administered 2019-04-11: 1 mg/h via INTRAVENOUS
  Filled 2019-04-11: qty 100

## 2019-04-11 NOTE — Progress Notes (Addendum)
1900 Report from day RN. Patient resting in bed. Fentanyl drip infusing to left upper arm PICC at 10 mcg/hr.  Drake.Saa TOD, MD made aware, [facility] called. Production assistant, radio. Coat , belt, shoes placed in patient belonging bag on the outside of bag.

## 2019-04-11 NOTE — Plan of Care (Signed)
  Problem: Education: Goal: Knowledge of General Education information will improve Description: Including pain rating scale, medication(s)/side effects and non-pharmacologic comfort measures Outcome: Not Progressing   Problem: Health Behavior/Discharge Planning: Goal: Ability to manage health-related needs will improve Outcome: Not Progressing   Problem: Clinical Measurements: Goal: Ability to maintain clinical measurements within normal limits will improve Outcome: Not Progressing Goal: Will remain free from infection Outcome: Not Progressing Goal: Diagnostic test results will improve Outcome: Not Progressing Goal: Respiratory complications will improve Outcome: Not Progressing Goal: Cardiovascular complication will be avoided Outcome: Not Progressing   Patient remains on comfort care, fentanyl drip started this shift. Patient in bed with eyes open, resp about 12 per minute appears comfortable and free from from distress at this time.

## 2019-04-11 NOTE — Progress Notes (Signed)
PROGRESS NOTE    [name]  GEX:[id number] DOB: 1929-01-17 DOA: 04/15/2019 PCP: Babs Sciara, MD    Brief Narrative:  86 year old female who presented with rectal bleeding.  She had a recent diagnosis for SARS COVID-19 viral pneumonia on January 17, she has history of colon cancer with gastrointestinal bleeding.  Recurrent small bowel obstructions, COPD, dyslipidemia chronic kidney disease and history of breast cancer and urothelial cancer.   Patient had a recent hospitalization for gastrointestinal bleeding, she was diagnosed with static adenocarcinoma of the colon by wedge liver biopsy.  She was discharged back to skilled nursing facility, her pretransfer screen for COVID-19 was positive (01/17).  She was discharged to a skilled nursing facility on January 19 and she was referred for the outpatient remdesivir infusion clinic.    At the infusion center on 01/20 patient had significant tachypnea and hypotension and transferred to the emergency department.  On her initial physical examination blood pressure 112/67, heart rate 103, respiratory 26, temperature 96.7, oxygen saturation 91%.  Her lungs had no wheezing or rails, heart S1-S2, present and rhythmic, abdomen soft, no extremity edema. Her chest radiograph had bilateral interstitial infiltrates  Patient was admitted to the hospital with a working diagnosis of acute hypoxic respiratory failure due to SARS COVID-19 viral pneumonia.  Patient received treatment with remdesivir, but her encephalopathy continued to deteriorate, poor oral intake, and poor prognosis.  Patient was transitioned to comfort care.  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 Acute hypoxic respiratory failure due to SARS covid 19 viral pneumonia. Sp #5/5 Remdesivir.   Patient has completed medical therapy for viral pneumonia, but has developed worsening renal failure ATN. She has been transition to comfort measures. Today patient in distress and added fentanyl drip for severe symptoms.   Patient with very poor prognosis.   2. ATN due to COVID 19 nephropathy. Patient with worsening distress, positive peripheral edema, will add fentanyl drip for comfort meassures.   3. Metabolic encephalopathy/ related to COVID 19.  Poor prognosis, continue aspiration precautions and comfort measures.    DVT prophylaxis: na   Code Status:  dnr Family Communication:  No family at the bedside  Disposition Plan/ discharge barriers: patient critically ill.    Nutrition Status:           Skin Documentation: Pressure Injury 04/07/19 Perineum (Active)  04/07/19 1700  Location: Perineum  Location Orientation:   Staging:   Wound Description (Comments):   Present on Admission:      Subjective: Patient looks this am in distress, agonal breathing, and very poor responsiveness, tracks with his head but not interactive, not following commands.   Objective: Vitals:   04/10/19 0746 04/10/19 1545 04/10/19 2000 04/11/19 0815  BP: 120/74 (!) 103/53 (!) 101/52 (!) 104/55  Pulse: (!) 112 (!) 107 (!) 105 100  Resp: 16  15 16   Temp: 97.8 F (36.6 C)  98.1 F (36.7 C) 97.7 F (36.5 C)  TempSrc: Axillary  Axillary Axillary  SpO2:  95% 95% 93%   No intake or output data in the 24 hours ending 04/11/19 0914 There were no vitals filed for this visit.  Examination:   General: deconditioned and ill looking  appearing Neurology: eyes open but not interactive, non verbal, not following commands, tracks with her head.  E ENT: positive pallor, no icterus, oral mucosa moist Cardiovascular: No JVD. S1-S2 present, rhythmic, no gallops, rubs, or murmurs. +++ pitting tender lower extremity edema. Pulmonary: decreased breath sounds bilaterally.  Gastrointestinal. Abdomen is protuberant with no organomegaly, non tender, no rebound or guarding Skin. No rashes Musculoskeletal: no joint deformities     Data Reviewed: I have personally reviewed following labs and imaging studies  CBC: Recent Labs  Lab 03/26/2019 1420 04/05/19 0013 04/05/19 0144 04/06/19 0117 04/08/19 0927  WBC 11.1* 16.5* 15.5* 15.9* 8.0  NEUTROABS  --   --  14.8* 15.0*  --   HGB 11.6* 10.7* 10.3* 10.5* 10.6*  HCT 37.1 32.6* 32.7* 32.2* 31.8*  MCV 93.0 91.3 92.6 89.9 87.8  PLT 288 244 234 267 273   Basic Metabolic Panel: Recent Labs  Lab 03/21/2019 1420 04/05/19 0144 04/06/19 0117 04/08/19 0927  NA 144 144 144 143  K 3.7 3.4* 3.1* 3.7  CL 116* 116* 116* 115*  CO2 16* 14* 14* 16*  GLUCOSE 103* 99 99 129*  BUN 50* 54* 55* 64*  CREATININE 2.65* 2.76* 2.94* 3.70*  CALCIUM 8.4* 8.1* 8.1* 7.7*  MG  --  2.1 2.0  --   PHOS  --  5.6* 5.3*  --    GFR: CrCl cannot be calculated (Unknown ideal weight.). Liver Function Tests: Recent Labs  Lab 04/14/2019 1420 04/05/19 0144 04/06/19 0117 04/08/19 0927  AST 25 23 27 17   ALT 19 19 20 20   ALKPHOS 106 78 93 83  BILITOT 0.6 0.5 0.4 0.2*  PROT 4.4* 4.5* 4.6* 4.1*  ALBUMIN 1.5* 1.8* 1.8* 1.4*   No results for input(s): LIPASE, AMYLASE in the last 168 hours. No results for input(s): AMMONIA in the last 168 hours. Coagulation Profile: No results for input(s): INR, PROTIME in the last 168 hours. Cardiac Enzymes: No results for input(s): CKTOTAL, CKMB, CKMBINDEX, TROPONINI in the last 168 hours. BNP (last 3 results) No results for input(s): PROBNP in the last 8760  hours. HbA1C: No results for input(s): HGBA1C in the last 72 hours. CBG: Recent Labs  Lab 04/08/19 0708 04/08/19 1133 04/08/19 1948 04/09/19 0740 04/09/19 1154  GLUCAP 143* 107* 127* 105* 54*   Lipid Profile: No results for input(s): CHOL, HDL, LDLCALC, TRIG, CHOLHDL, LDLDIRECT in the last 72 hours. Thyroid Function Tests: No results for input(s): TSH, T4TOTAL, FREET4, T3FREE, THYROIDAB in the last 72 hours. Anemia Panel: Recent Labs    04/08/19 0928  FERRITIN 439*      Radiology Studies: I have reviewed all of the imaging during this hospital visit personally     Scheduled Meds: . Chlorhexidine Gluconate Cloth  6 each Topical Daily  . mouth rinse  15 mL Mouth Rinse BID  . sodium chloride flush  10-40 mL Intracatheter Q12H   Continuous Infusions:   LOS: 7 days        Rabecka Brendel Annett Gula, MD

## 2019-04-11 NOTE — Progress Notes (Signed)
   04/11/19 1441  Family/Significant Other Communication  Family/Significant Other Update Called (nephrew Scott called, no answer)

## 2019-04-11 NOTE — Progress Notes (Signed)
   04/11/19 1453  Family/Significant Other Communication  Family/Significant Other Update Updated;Other (Comment) (nephrew Scott returned miss call and updated)

## 2019-04-11 NOTE — Progress Notes (Signed)
Morphine gtt discontiuned, 67ml wasted in designated Astronomer by Ayan Heffington,RN and Mortimer Fries, RN.

## 2019-04-12 ENCOUNTER — Telehealth: Payer: Self-pay | Admitting: Family Medicine

## 2019-04-12 DIAGNOSIS — N179 Acute kidney failure, unspecified: Secondary | ICD-10-CM

## 2019-04-12 DIAGNOSIS — J069 Acute upper respiratory infection, unspecified: Secondary | ICD-10-CM

## 2019-04-12 DIAGNOSIS — U071 COVID-19: Secondary | ICD-10-CM

## 2019-04-16 NOTE — Telephone Encounter (Signed)
Nephew calling to tell us that his aunt had pased away and wanted to thank the office for her care and to thank Dr. Nicki Reaper for all his years of caring for her.  He said that she thought the world of Dr. Nicki Reaper.

## 2019-04-16 NOTE — Death Summary Note (Signed)
Death Summary  [name] QJJ:[id number] DOB: 07-20-1928 DOA: 16-Apr-2019  PCP: Kathyrn Drown, MD  Admit date: 04-16-2019 Date of Death: 24-Apr-2019 Time of Death: 05:45 am Notification: Kathyrn Drown, MD notified of death of 04/24/19   History of present illness:  [name] is a 86 y.o. female with a history of metastatic colon cancer, recurrent small bowel obstructions, recent GI hemorrhage, chronic kidney disease stage IV, and COPD who was diagnosed with COVID-19 on 04/01/2019 and developed viral pneumonia with acute on chronic hypoxic respiratory failure.  She was treated with steroids, remdesivir, vitamins, but unfortunately continued to worsen.  Hospitalization was complicated by acute kidney injury superimposed on her advanced CKD, further complicated by acidosis and hyperkalemia.  Prognosis was felt to be quite poor and palliative care was involved.  Next of kin was identified as the patient's nephew who was kept abreast of the patient's condition.  On 04/08/2019, with the patient continuing to worsen despite aggressive treatments, the patient's next of kin felt that the patient would want [place] to focus on comfort measures only, understanding that this may hasten her passing.  Goal of care was changed to comfort on 04/08/2019 and the patient expired the morning of 04/24/2019.  Final Diagnoses:  1.   COVID-19 pneumonia with hypoxic respiratory failure  2.   Acute kidney injury superimposed on CKD IV  3.   Metastatic colon cancer  4.   COPD    The results of significant diagnostics from this hospitalization (including imaging, microbiology, ancillary and laboratory) are listed below for reference.    Significant Diagnostic Studies: CT ABDOMEN PELVIS WO CONTRAST  Result Date: 03/15/2019 CLINICAL DATA:  Follow-up small-bowel obstruction EXAM: CT ABDOMEN AND PELVIS WITHOUT CONTRAST TECHNIQUE: Multidetector CT imaging of the abdomen and pelvis was performed following the  standard protocol without IV contrast. COMPARISON:  03/08/2019 FINDINGS: Lower chest: No acute abnormality. Hepatobiliary: Gallbladder is partially contracted. Stable appearance of scattered low-density lesions throughout the liver including a subcapsular lesion at the posterior aspect of the right hepatic lobe containing dystrophic calcification. No definite new hepatic lesion identified. No biliary dilatation. Pancreas: Unremarkable. No pancreatic ductal dilatation or surrounding inflammatory changes. Spleen: Normal in size without focal abnormality. Adrenals/Urinary Tract: Adrenal glands are unremarkable. Kidneys are within normal limits without focal lesion or hydronephrosis. There are renal vascular calcifications. Bladder is unremarkable. Stomach/Bowel: Persistent small bowel obstruction with transition point in the right hemipelvis (series 6, images 30-34). The degree of bowel dilatation is similar to the previous exam. By the colon remains largely collapsed. Stomach is mildly distended as well. Vascular/Lymphatic: Aortic atherosclerosis. No enlarged abdominal or pelvic lymph nodes. Reproductive: Status post hysterectomy. No adnexal masses. Other: No ascites. No pneumoperitoneum. No abdominal wall hernia. Musculoskeletal: No new or acute osseous findings. IMPRESSION: 1. Persistent small bowel obstruction with transition point in the right hemipelvis, likely secondary to adhesion. No free fluid or free air to suggest perforation. 2. Additional chronic findings, as above. Aortic Atherosclerosis (ICD10-I70.0). Electronically Signed   By: Davina Poke D.O.   On: 03/15/2019 18:43   DG Abd 1 View  Result Date: 03/26/2019 CLINICAL DATA:  Small-bowel obstruction EXAM: ABDOMEN - 1 VIEW COMPARISON:  March 25, 2019 FINDINGS: There remains mild air-filled dilated loops of small bowel seen within the mid abdomen measuring up to 4.4 cm. There is air seen to the level of the rectum. No acute osseous abnormality.  IMPRESSION: No significant change in findings suggestive of partial small bowel obstruction Electronically  Signed   By: Prudencio Pair M.D.   On: 03/26/2019 06:04   DG CHEST PORT 1 VIEW  Result Date: 04/02/2019 CLINICAL DATA:  Shortness of breath. EXAM: PORTABLE CHEST 1 VIEW COMPARISON:  April 02, 2019 FINDINGS: There is stable right-sided PICC line positioning. Mild diffusely increased interstitial lung markings are seen. This is most prominent within the bilateral lung bases, left greater than right. The heart size and mediastinal contours are within normal limits. Radiopaque surgical clips are seen within the soft tissues of the right breast. The visualized skeletal structures are unremarkable. IMPRESSION: 1. No significant interval change in bilateral interstitial opacities when compared to the prior study dated April 02, 2019. Electronically Signed   By: Virgina Norfolk M.D.   On: 04/03/2019 20:50   DG CHEST PORT 1 VIEW  Result Date: 04/02/2019 CLINICAL DATA:  Acute respiratory disease due to COVID-19. EXAM: PORTABLE CHEST 1 VIEW COMPARISON:  March 15, 2019 FINDINGS: The heart size borderline. The hila and mediastinum are normal. Right PICC line terminates in the SVC. No pneumothorax. Increased interstitial opacities are seen in both lungs, more focal in the right upper lobe in the left base. There is probably a small effusion on the left. No other acute abnormalities. IMPRESSION: Bilateral diffuse interstitial opacities with more focal opacity in the right upper lobe in left base are favored to represent sequela of the patient's COVID-19 status. Pulmonary edema could have a similar appearance. Recommend clinical correlation. Electronically Signed   By: Dorise Bullion III M.D   On: 04/02/2019 13:05   DG Chest Portable 1 View  Result Date: 03/15/2019 CLINICAL DATA:  NG tube placement EXAM: PORTABLE CHEST 1 VIEW COMPARISON:  03/08/2019 FINDINGS: Suspected skin fold artifact over the right  upper thorax. Clips at the right chest. No acute consolidation, pleural effusion or pneumothorax. Stable cardiomediastinal silhouette with aortic atherosclerosis. Esophageal tube tip is below the diaphragm but incompletely visualized. IMPRESSION: 1. Esophageal tube tip below the diaphragm but incompletely visualized. 2. No acute airspace disease. Electronically Signed   By: Donavan Foil M.D.   On: 03/15/2019 21:53   DG Abd 2 Views  Result Date: 03/25/2019 CLINICAL DATA:  Small bowel obstruction. EXAM: ABDOMEN - 2 VIEW COMPARISON:  03/24/2019 FINDINGS: There is moderate dilatation of multiple small bowel loops without significant interval change. No intraperitoneal free air is identified. There is mild lumbar scoliosis. IMPRESSION: Unchanged small bowel dilatation consistent with obstruction. Electronically Signed   By: Logan Bores M.D.   On: 03/25/2019 07:54   Acute Abd Series  Result Date: 03/21/2019 CLINICAL DATA:  Nausea, vomiting, diarrhea, abdominal pain EXAM: DG ABDOMEN ACUTE W/ 1V CHEST COMPARISON:  03/09/2019 FINDINGS: There is no evidence of dilated bowel loops or free intraperitoneal air. Heart size and mediastinal contours are within normal limits. Both lungs are clear. IMPRESSION: Negative abdominal radiographs.  No acute cardiopulmonary disease. Electronically Signed   By: Macy Mis M.D.   On: 03/21/2019 08:52   DG Abd Portable 1V  Result Date: 03/24/2019 CLINICAL DATA:  History of colonic resection September 2020. History of small-bowel obstructions. History of breast cancer. EXAM: PORTABLE ABDOMEN - 1 VIEW COMPARISON:  March 21, 2019 FINDINGS: Dilated loops of small bowel are identified consistent with small-bowel obstruction. Evaluation for free air is limited due to the lack of upright imaging or decubitus imaging. IMPRESSION: Small bowel obstruction. Evaluation for free air is limited as above. Electronically Signed   By: Dorise Bullion III M.D   On: 03/24/2019 13:06  [place] EKG  SITE RITE  Result Date: 04/06/2019 If [facility] image not attached, placement could not be confirmed due to current cardiac rhythm.  [place] EKG SITE RITE  Result Date: 03/29/2019 If Site Rite image not attached, placement could not be confirmed due to current cardiac rhythm.   Microbiology: Recent Results (from the past 240 hour(s))  Respiratory Panel by RT PCR (Flu A&B, Covid) - Nasopharyngeal Swab     Status: Abnormal   Collection Time: 04/02/19 11:04 AM   Specimen: Nasopharyngeal Swab  Result Value Ref Range Status   SARS Coronavirus 2 by RT PCR POSITIVE (A) NEGATIVE Final    Comment: RESULT CALLED TO, READ BACK BY AND VERIFIED WITH: WRIGHT E. AT 1243 ON [id number] BY THOMPSON S.    Influenza A by PCR NEGATIVE NEGATIVE Final   Influenza B by PCR NEGATIVE NEGATIVE Final    Comment: (NOTE) The Xpert Xpress SARS-CoV-2/FLU/RSV assay is intended as an aid in  the diagnosis of influenza from Nasopharyngeal swab specimens and  should not be used as a sole basis for treatment. Nasal washings and  aspirates are unacceptable for Xpert Xpress SARS-CoV-2/FLU/RSV  testing. Fact Sheet for Patients: PinkCheek.be Fact Sheet for Healthcare Providers: GravelBags.it This test is not yet approved or cleared by the [place] FDA and  has been authorized for detection and/or diagnosis of SARS-CoV-2 by  FDA under an Emergency Use Authorization (EUA). This EUA will remain  in effect (meaning this test can be used) for the duration of the  Covid-19 declaration under Section 564(b)(1) of the Act, 21  U.S.C. section 360bbb-3(b)(1), unless the authorization is  terminated or revoked. Performed at [facility], [address] [place], [place] [address]      Labs: Basic Metabolic Panel: Recent Labs  Lab 04/06/19 0117 04/08/19 0927  NA 144 143  K 3.1* 3.7  CL 116* 115*  CO2 14* 16*  GLUCOSE 99 129*  BUN 55* 64*  CREATININE 2.94* 3.70*    CALCIUM 8.1* 7.7*  MG 2.0  --   PHOS 5.3*  --    Liver Function Tests: Recent Labs  Lab 04/06/19 0117 04/08/19 0927  AST 27 17  ALT 20 20  ALKPHOS 93 83  BILITOT 0.4 0.2*  PROT 4.6* 4.1*  ALBUMIN 1.8* 1.4*   No results for input(s): LIPASE, AMYLASE in the last 168 hours. No results for input(s): AMMONIA in the last 168 hours. CBC: Recent Labs  Lab 04/06/19 0117 04/08/19 0927  WBC 15.9* 8.0  NEUTROABS 15.0*  --   HGB 10.5* 10.6*  HCT 32.2* 31.8*  MCV 89.9 87.8  PLT 267 273   Cardiac Enzymes: No results for input(s): CKTOTAL, CKMB, CKMBINDEX, TROPONINI in the last 168 hours. D-Dimer No results for input(s): DDIMER in the last 72 hours. BNP: Invalid input(s): POCBNP CBG: Recent Labs  Lab 04/08/19 0708 04/08/19 1133 04/08/19 1948 04/09/19 0740 04/09/19 1154  GLUCAP 143* 107* 127* 105* 54*   Anemia work up No results for input(s): VITAMINB12, FOLATE, FERRITIN, TIBC, IRON, RETICCTPCT in the last 72 hours. Urinalysis    Component Value Date/Time   COLORURINE YELLOW 03/15/2019 1640   APPEARANCEUR HAZY (A) 03/15/2019 1640   LABSPEC 1.015 03/15/2019 1640   PHURINE 5.0 03/15/2019 1640   GLUCOSEU NEGATIVE 03/15/2019 1640   HGBUR NEGATIVE 03/15/2019 1640   BILIRUBINUR NEGATIVE 03/15/2019 1640   KETONESUR NEGATIVE 03/15/2019 1640   PROTEINUR NEGATIVE 03/15/2019 1640   NITRITE NEGATIVE 03/15/2019 1640   LEUKOCYTESUR NEGATIVE 03/15/2019 1640  Sepsis Labs Invalid input(s): PROCALCITONIN,  WBC,  LACTICIDVEN     SIGNED:  Vianne Bulls, MD  [facility] 04/23/19, 5:58 AM Pager   If 7PM-7AM, please contact night-coverage [email or web address] Password [email or web address]

## 2019-04-16 NOTE — Telephone Encounter (Signed)
I had a good conversation with the nephew.  Certainly [name] will be greatly missed

## 2019-04-16 NOTE — Progress Notes (Addendum)
2429 Fentanyl 248 ml wasted with RN Tami Lin

## 2019-04-16 DEATH — deceased

## 2019-04-20 MED FILL — Sodium Chloride IV Soln 0.9%: INTRAVENOUS | Qty: 250 | Status: AC

## 2019-04-20 MED FILL — Fentanyl Citrate Preservative Free (PF) Inj 2500 MCG/50ML: INTRAMUSCULAR | Qty: 50 | Status: AC

## 2019-04-27 ENCOUNTER — Other Ambulatory Visit (HOSPITAL_COMMUNITY): Payer: Medicare Other

## 2019-05-03 ENCOUNTER — Ambulatory Visit (HOSPITAL_COMMUNITY): Payer: Medicare Other | Admitting: Hematology

## 2020-07-12 IMAGING — CT CT ABD-PELV W/O CM
2 of 4 series · 16 of 46 positions shown, 18 images · non-contrast
Comparison: 12/04/2018

CLINICAL DATA: Abdominal pain, nausea, and vomiting for a week,
history breast cancer, COPD, hypertension, diabetes mellitus

EXAM:
CT ABDOMEN AND PELVIS WITHOUT CONTRAST
TECHNIQUE: Multidetector CT imaging of the abdomen and pelvis was performed
following the standard protocol without IV contrast. Sagittal and
coronal MPR images reconstructed from axial data set. No oral
contrast was administered.

[Series 2: axial st · axial · 0.63mm/px · z∈[+770,+1140]mm · 13 of 84 slices shown, 15 images]
[im 5/84  soft-tissue]
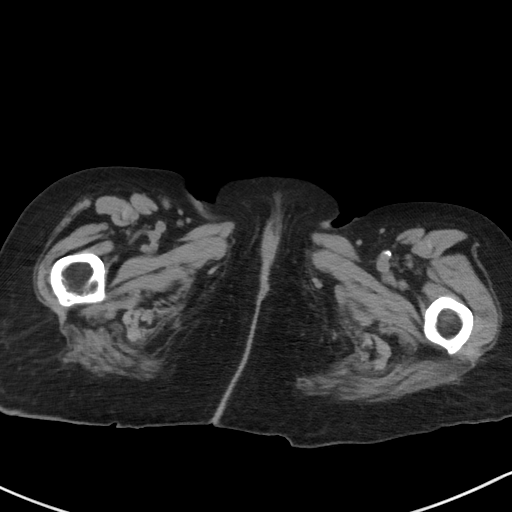
[im 5/84  bone]
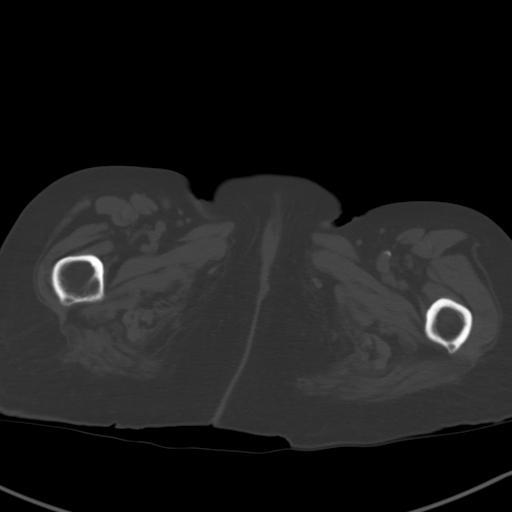
[im 14/84  soft-tissue]
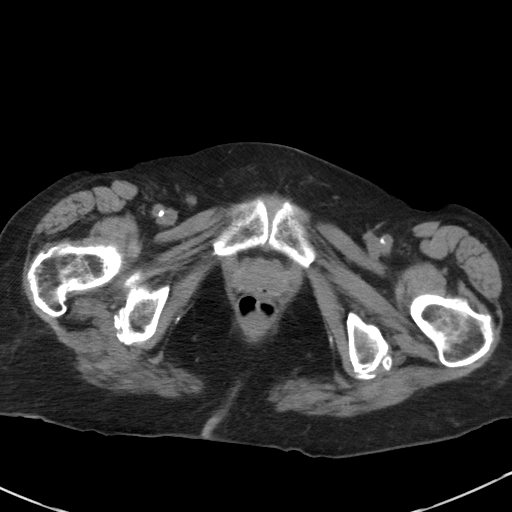
[im 18/84  soft-tissue]
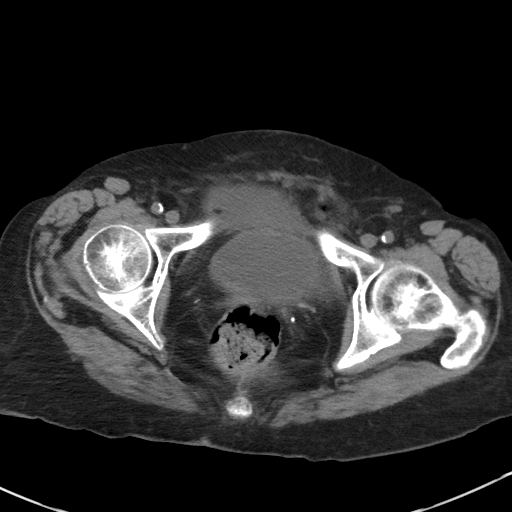
[im 22/84  soft-tissue]
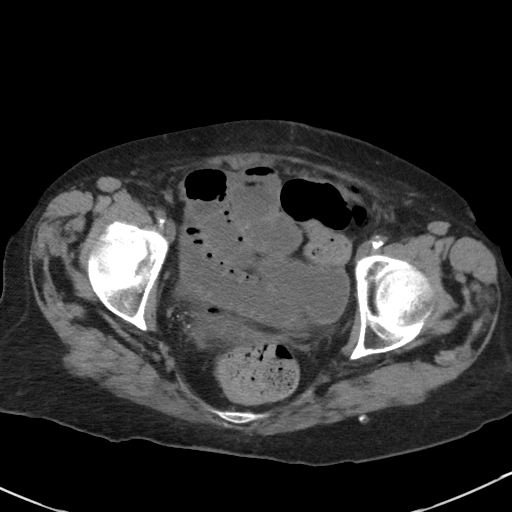
[im 31/84  soft-tissue]
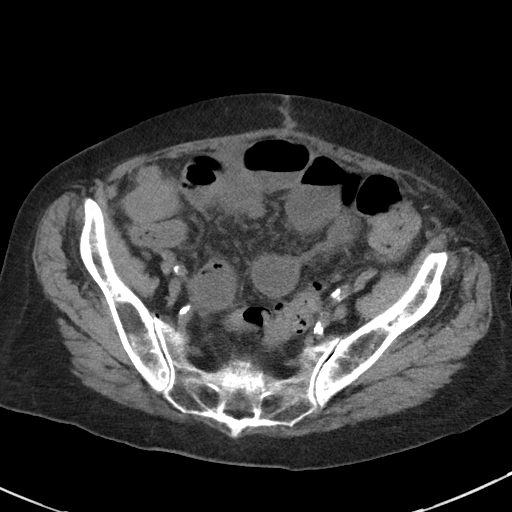
[im 35/84  soft-tissue]
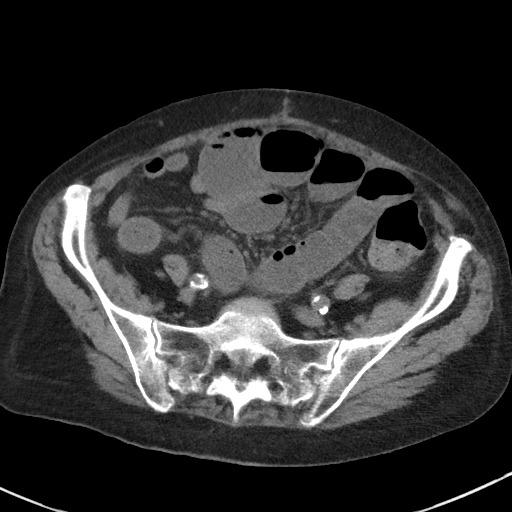
[im 44/84  soft-tissue]
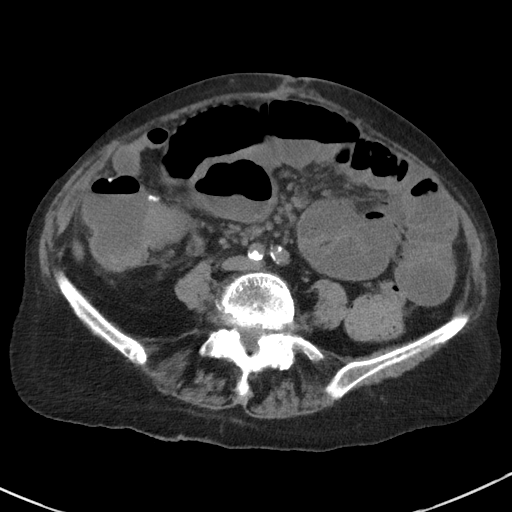
[im 49/84  soft-tissue]
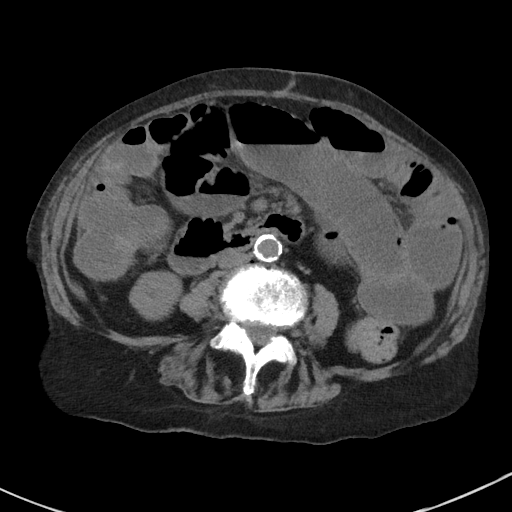
[im 53/84  soft-tissue]
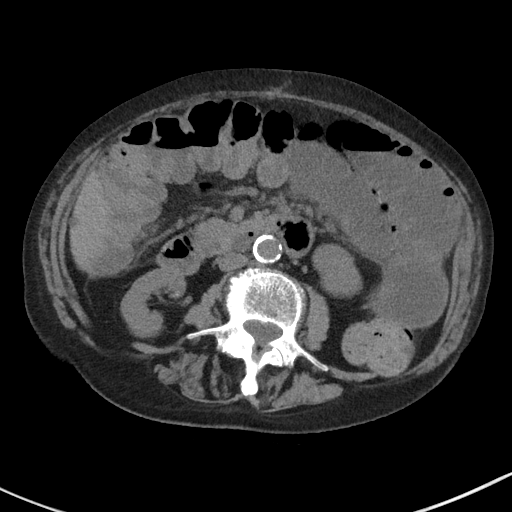
[im 53/84  bone]
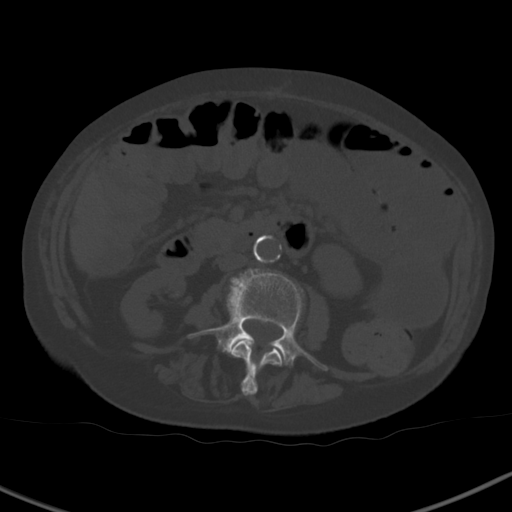
[im 62/84  soft-tissue]
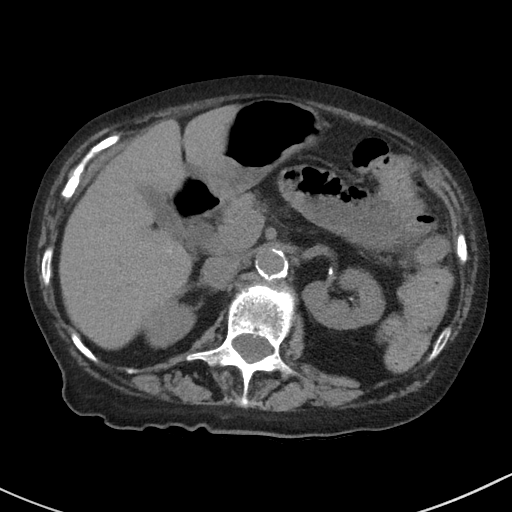
[im 66/84  soft-tissue]
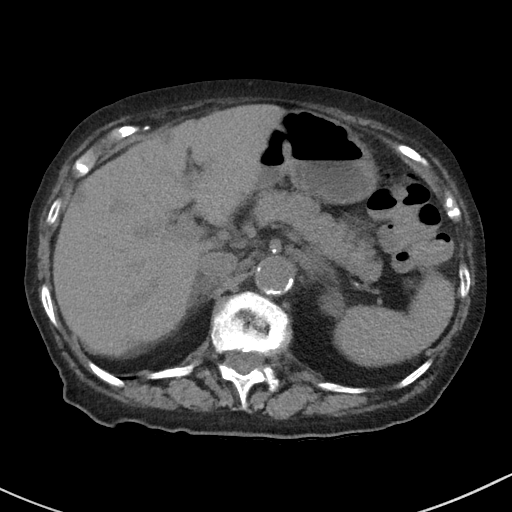
[im 70/84  soft-tissue]
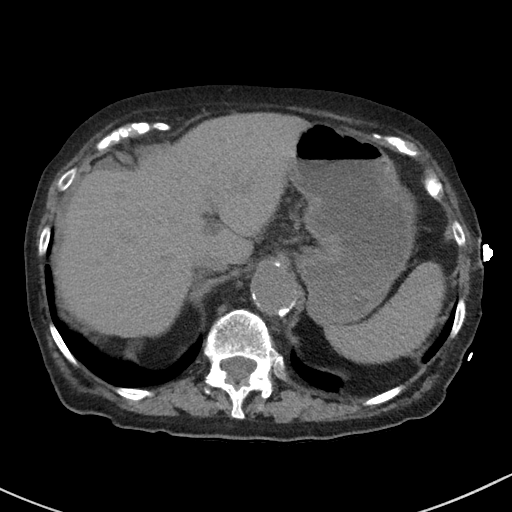
[im 79/84  soft-tissue]
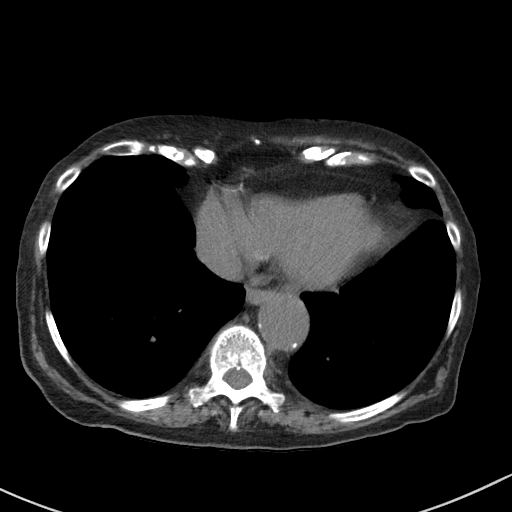

[Series 5: coronal st · coronal · 0.69mm/px · 3 of 85 slices shown]
[im 29/85  soft-tissue]
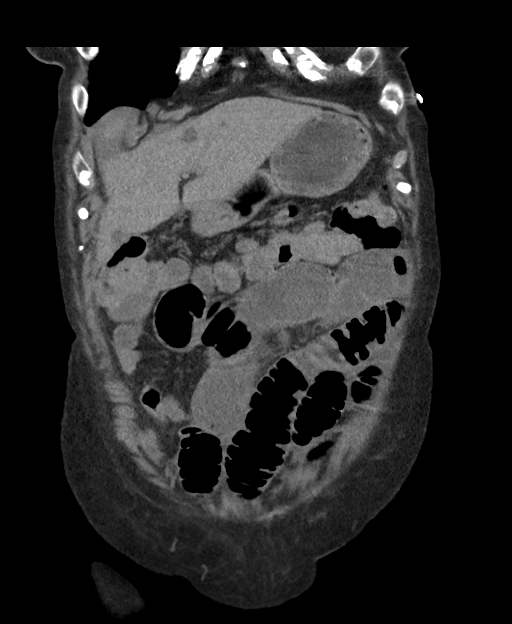
[im 38/85  soft-tissue]
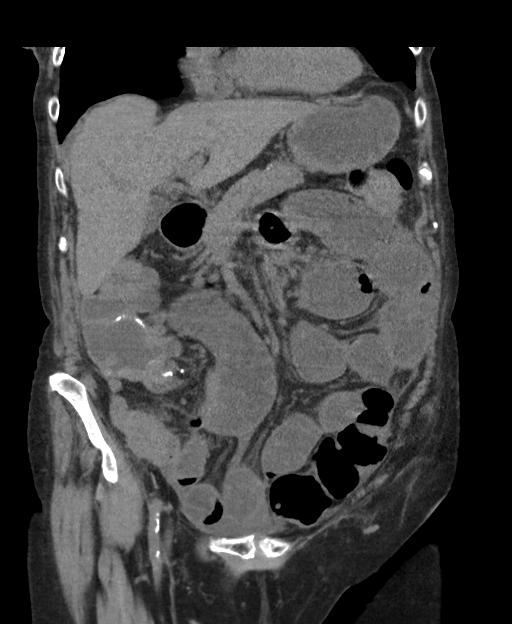
[im 47/85  soft-tissue]
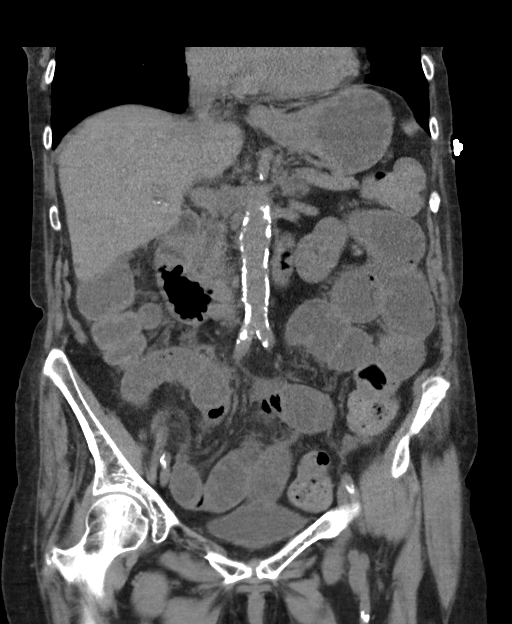

[16 of 46 positions shown; findings below may reference images not displayed]

FINDINGS: Lower chest: Chronic atelectasis versus scarring at RIGHT lower
lobe. Remaining lung bases clear.

Hepatobiliary: Gallbladder unremarkable. Multiple low-attenuation
foci within liver likely representing small cysts. Additional cystic
lesion with dystrophic calcification 2.6 x 2.4 cm image 17,
posterior RIGHT lobe liver, unchanged.

Pancreas: Normal appearance

Spleen: Normal appearance

Adrenals/Urinary Tract: Thickening of adrenal glands without
discrete mass. Renal vascular calcifications. No definite renal mass
or hydronephrosis. Bladder and ureters unremarkable.

Stomach/Bowel: Prior ileocolic resection. Stomach unremarkable.
Colon decompressed. Dilated proximal and decompressed distal small
bowel loops compatible with small bowel obstruction. Transition from
dilated to nondilated small bowel occurs in the RIGHT lower
quadrant, favor adhesion. Minimal scattered edema within mesentery.
No definite bowel wall thickening.

Vascular/Lymphatic: Atherosclerotic calcifications aorta and iliac
arteries. Aorta normal caliber. No adenopathy.

Reproductive: Uterus surgically absent. Nonvisualization of ovaries.

Other: No free air or free fluid.  No hernia.

Musculoskeletal: Osseous demineralization.
IMPRESSION: Distal small bowel obstruction, suspect related to adhesion in the
RIGHT upper pelvis.

Stable hepatic cysts including a 2.6 x 2.4 cm cyst with dystrophic
calcification at the posterior RIGHT lobe liver.

Aortic Atherosclerosis ([id number]).

## 2020-07-19 IMAGING — DX DG CHEST 1V PORT
2 series · 2 of 2 positions shown · non-contrast
Comparison: 03/08/2019

CLINICAL DATA: NG tube placement

EXAM:
PORTABLE CHEST 1 VIEW

[chest ap (1 of 2)]
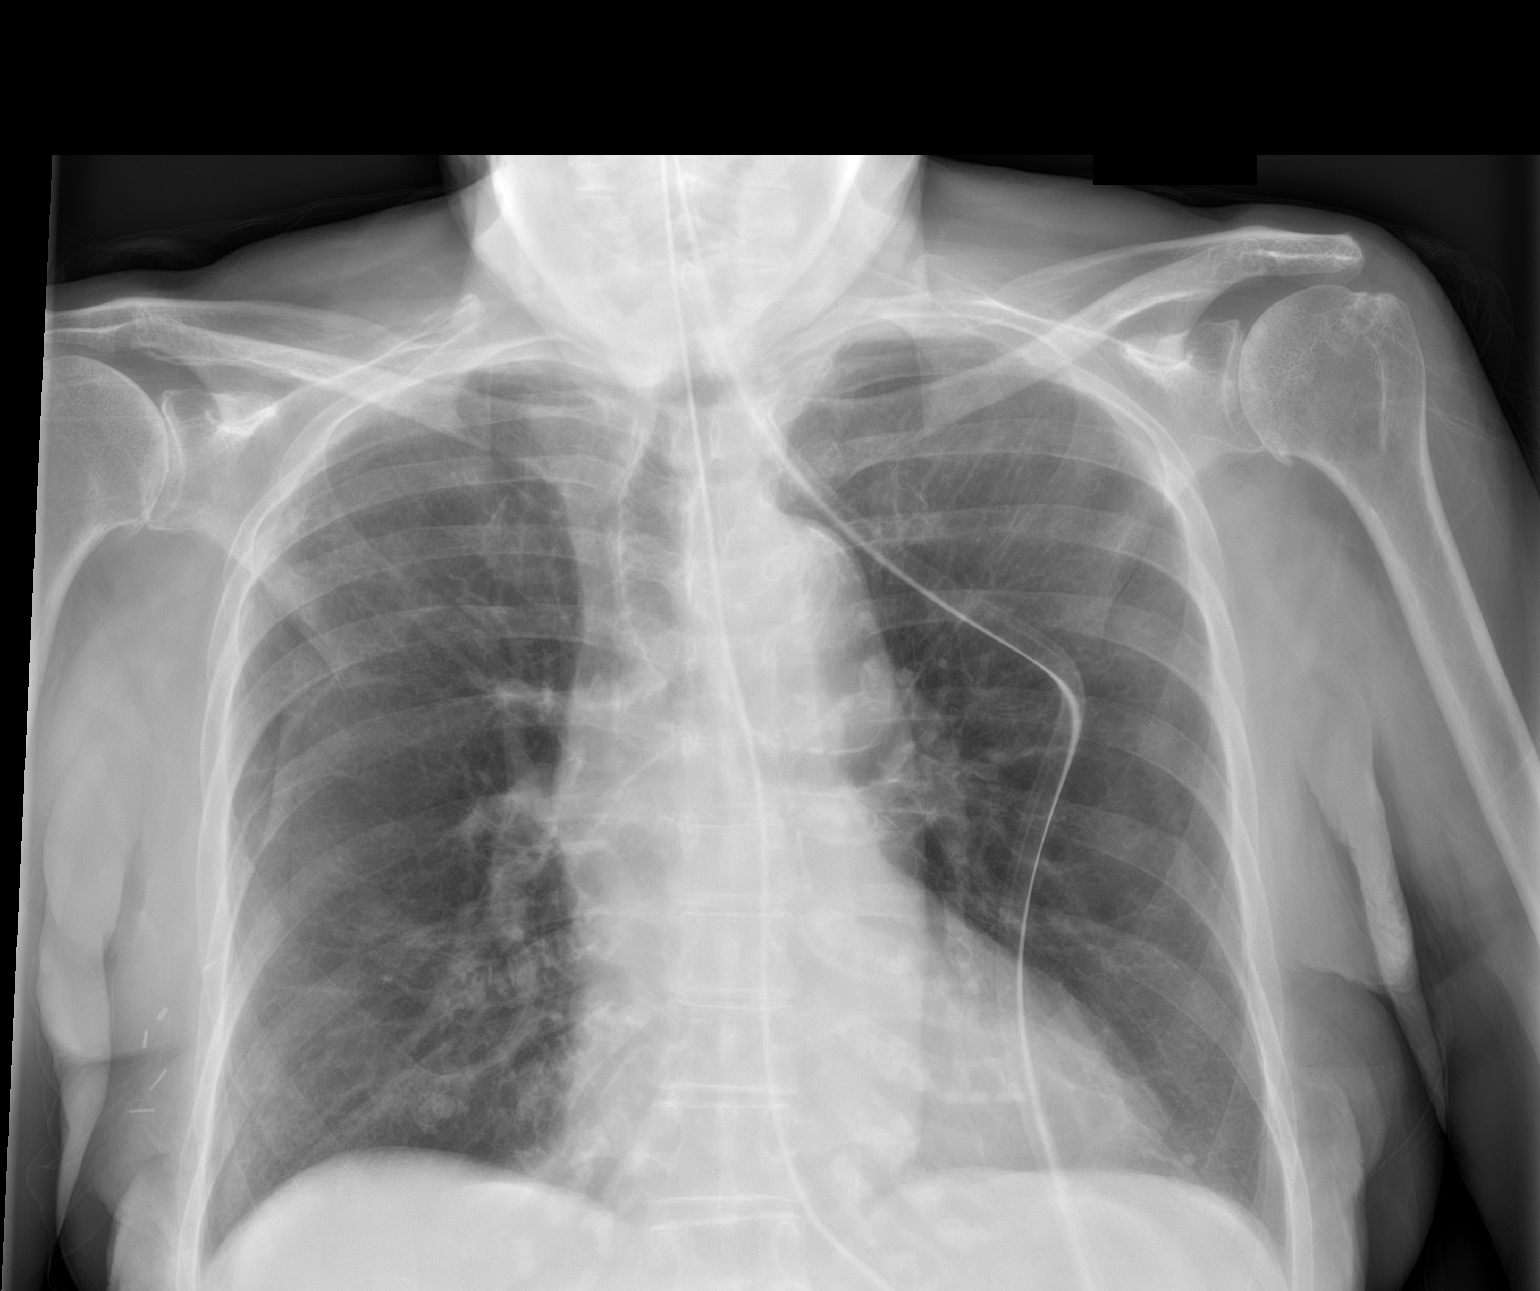

[chest ap (2 of 2)]
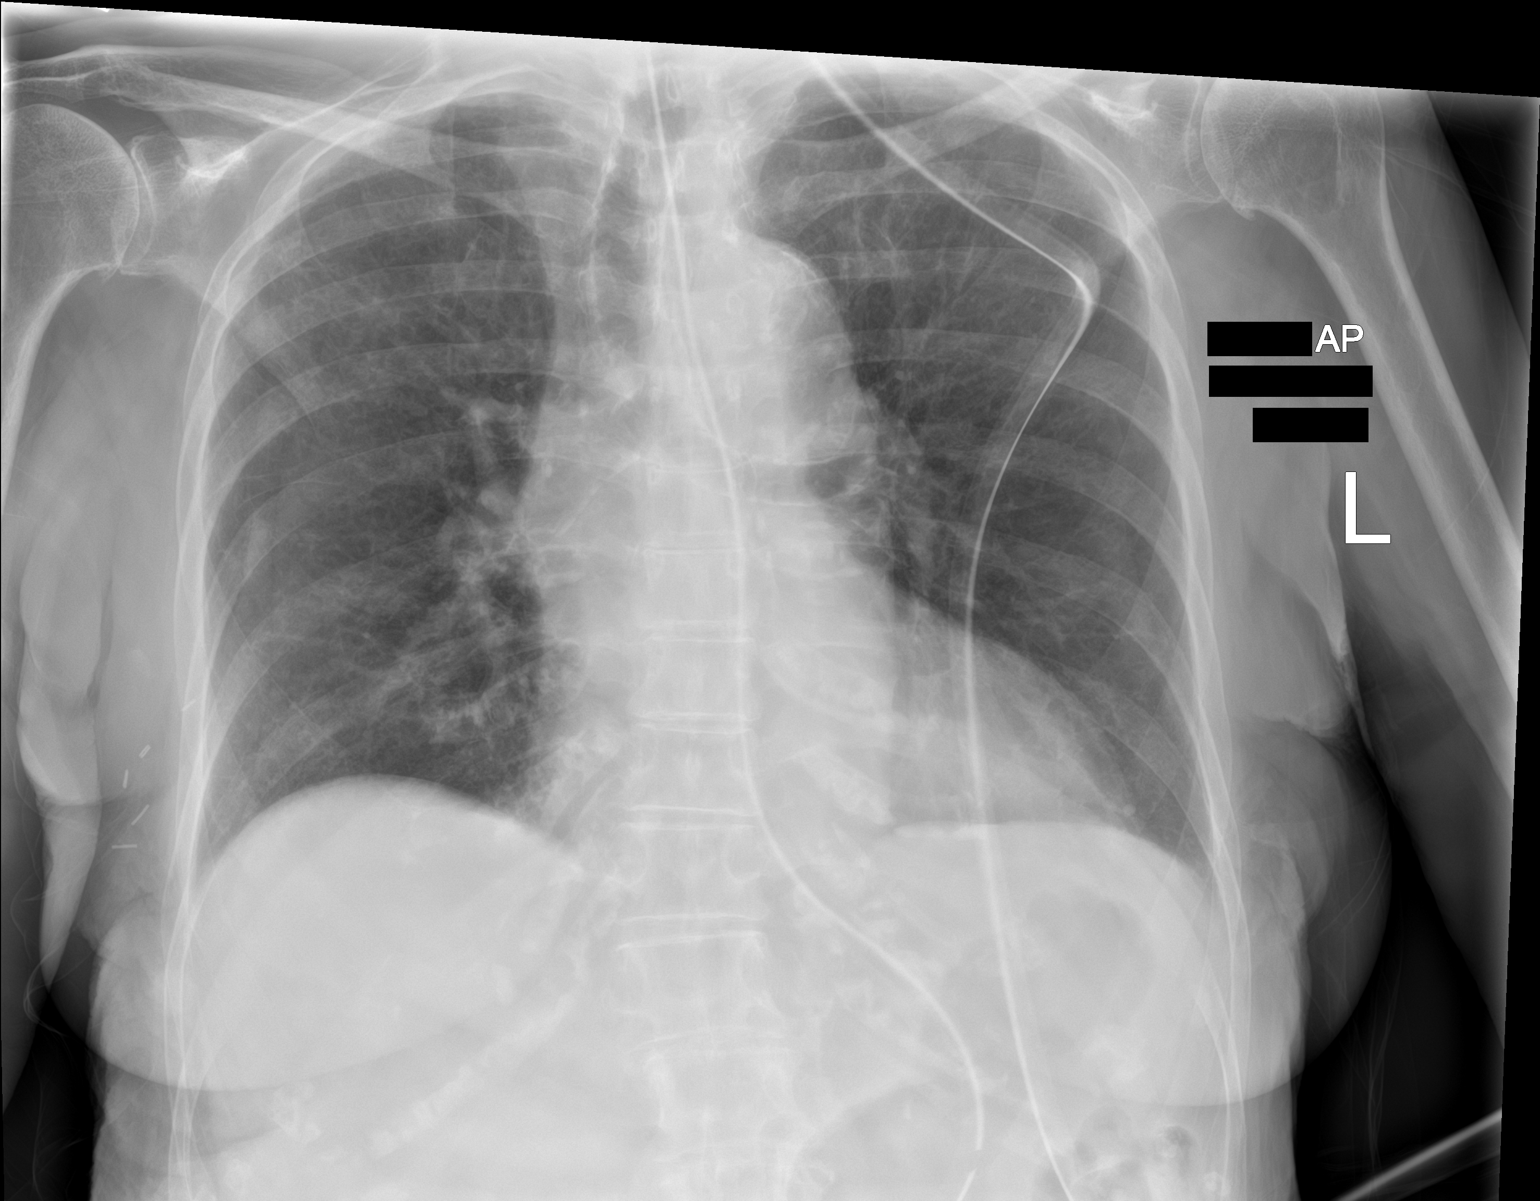

[2 of 2 positions shown; findings below may reference images not displayed]

FINDINGS: Suspected skin fold artifact over the right upper thorax. Clips at
the right chest. No acute consolidation, pleural effusion or
pneumothorax. Stable cardiomediastinal silhouette with aortic
atherosclerosis. Esophageal tube tip is below the diaphragm but
incompletely visualized.
IMPRESSION: 1. Esophageal tube tip below the diaphragm but incompletely
visualized.
2. No acute airspace disease.
